# Patient Record
Sex: Male | Born: 1980
Health system: Southern US, Community
[De-identification: ages and names within clinical notes are randomized; demographics above are authoritative.]

## PROBLEM LIST (undated history)

## (undated) DIAGNOSIS — I5022 Chronic systolic (congestive) heart failure: Secondary | ICD-10-CM

## (undated) DIAGNOSIS — Z21 Asymptomatic human immunodeficiency virus [HIV] infection status: Secondary | ICD-10-CM

## (undated) DIAGNOSIS — F1910 Other psychoactive substance abuse, uncomplicated: Secondary | ICD-10-CM

## (undated) DIAGNOSIS — I509 Heart failure, unspecified: Secondary | ICD-10-CM

## (undated) DIAGNOSIS — F419 Anxiety disorder, unspecified: Secondary | ICD-10-CM

## (undated) DIAGNOSIS — I10 Essential (primary) hypertension: Secondary | ICD-10-CM

## (undated) DIAGNOSIS — I429 Cardiomyopathy, unspecified: Secondary | ICD-10-CM

## (undated) DIAGNOSIS — B20 Human immunodeficiency virus [HIV] disease: Secondary | ICD-10-CM

## (undated) DIAGNOSIS — Z9114 Patient's other noncompliance with medication regimen: Secondary | ICD-10-CM

## (undated) DIAGNOSIS — Z91148 Patient's other noncompliance with medication regimen for other reason: Secondary | ICD-10-CM

## (undated) DIAGNOSIS — N182 Chronic kidney disease, stage 2 (mild): Secondary | ICD-10-CM

## (undated) DIAGNOSIS — I517 Cardiomegaly: Secondary | ICD-10-CM

## (undated) HISTORY — PX: NO PAST SURGERIES: SHX2092

## (undated) HISTORY — DX: Other psychoactive substance abuse, uncomplicated: F19.10

## (undated) HISTORY — DX: Asymptomatic human immunodeficiency virus (hiv) infection status: Z21

## (undated) HISTORY — DX: Anxiety disorder, unspecified: F41.9

## (undated) HISTORY — DX: Essential (primary) hypertension: I10

## (undated) HISTORY — DX: Cardiomyopathy, unspecified: I42.9

## (undated) HISTORY — DX: Chronic kidney disease, stage 2 (mild): N18.2

## (undated) HISTORY — DX: Chronic systolic (congestive) heart failure: I50.22

## (undated) HISTORY — DX: Human immunodeficiency virus (HIV) disease: B20

---

## 2018-04-12 ENCOUNTER — Encounter (HOSPITAL_COMMUNITY): Payer: Self-pay | Admitting: Emergency Medicine

## 2018-04-12 ENCOUNTER — Other Ambulatory Visit: Payer: Self-pay

## 2018-04-12 ENCOUNTER — Emergency Department (HOSPITAL_COMMUNITY): Payer: Self-pay

## 2018-04-12 ENCOUNTER — Emergency Department (HOSPITAL_COMMUNITY)
Admission: EM | Admit: 2018-04-12 | Discharge: 2018-04-12 | Disposition: A | Discharge status: Home / Self Care | Payer: Self-pay | Attending: Emergency Medicine | Admitting: Emergency Medicine

## 2018-04-12 DIAGNOSIS — N50819 Testicular pain, unspecified: Secondary | ICD-10-CM

## 2018-04-12 DIAGNOSIS — F17210 Nicotine dependence, cigarettes, uncomplicated: Secondary | ICD-10-CM | POA: Insufficient documentation

## 2018-04-12 DIAGNOSIS — N50812 Left testicular pain: Secondary | ICD-10-CM | POA: Insufficient documentation

## 2018-04-12 DIAGNOSIS — I10 Essential (primary) hypertension: Secondary | ICD-10-CM | POA: Insufficient documentation

## 2018-04-12 HISTORY — DX: Essential (primary) hypertension: I10

## 2018-04-12 LAB — URINALYSIS, ROUTINE W REFLEX MICROSCOPIC
BACTERIA UA: NONE SEEN
Bilirubin Urine: NEGATIVE
Glucose, UA: NEGATIVE mg/dL
KETONES UR: NEGATIVE mg/dL
Leukocytes, UA: NEGATIVE
Nitrite: NEGATIVE
Protein, ur: 100 mg/dL — AB
SPECIFIC GRAVITY, URINE: 1.027 (ref 1.005–1.030)
pH: 5 (ref 5.0–8.0)

## 2018-04-12 MED ORDER — IBUPROFEN 800 MG PO TABS: 800.0000 mg | ORAL_TABLET | Freq: Three times a day (TID) | ORAL | 0 refills | Status: DC | PRN

## 2018-04-12 NOTE — ED Provider Notes (Signed)
[facility] EMERGENCY DEPARTMENT Provider Note   CSN: [id number] Arrival date & time: 04/12/18  1731     History   Chief Complaint Chief Complaint  Patient presents with  . Groin Pain    HPI Richard Cox is a 37 y.o. male.  He is presented in the emergency department with some left testicular discomfort since around 5 AM this morning.  He states it woke him up in its but is been generally sore.  He has felt the sensation in the past where the testicle has twisted and it is usually untwisted on its own with time.  He is never needed to present to the emergency department but it has not resolved and so he wanted to get checked out.  He is had no difficulty passing any urine denies any STD exposures no discharge,  no sores.  no fever or abdominal pain.  There is no trauma.  The history is provided by the patient.  Groin Pain  This is a new problem. The current episode started 6 to 12 hours ago. The problem occurs constantly. The problem has not changed since onset.Pertinent negatives include no chest pain, no abdominal pain, no headaches and no shortness of breath. The symptoms are aggravated by walking. The symptoms are relieved by rest. He has tried rest for the symptoms. The treatment provided mild relief.    Past Medical History:  Diagnosis Date  . Hypertension     There are no active problems to display for this patient.   History reviewed. No pertinent surgical history.      Home Medications    Prior to Admission medications   Medication Sig Start Date End Date Taking? Authorizing Provider  acetaminophen (TYLENOL) 500 MG tablet Take 500-1,500 mg by mouth daily as needed for mild pain or moderate pain.   Yes [provider]  ibuprofen (ADVIL,MOTRIN) 800 MG tablet Take 1 tablet (800 mg total) by mouth every 8 (eight) hours as needed. 04/12/18   Hayden Rasmussen, MD    Family History History reviewed. No pertinent family history.  Social History Social History    Tobacco Use  . Smoking status: Current Every Day Smoker    Packs/day: 1.00    Types: Cigarettes  . Smokeless tobacco: Never Used  Substance Use Topics  . Alcohol use: Yes    Comment: occ  . Drug use: Not on file     Allergies   Patient has no known allergies.   Review of Systems Review of Systems  Constitutional: Negative for fever.  HENT: Negative for sore throat.   Eyes: Negative for visual disturbance.  Respiratory: Negative for shortness of breath.   Cardiovascular: Negative for chest pain.  Gastrointestinal: Negative for abdominal pain.  Genitourinary: Positive for testicular pain. Negative for dysuria and frequency.  Musculoskeletal: Negative for back pain and neck pain.  Skin: Negative for rash.  Neurological: Negative for headaches.     Physical Exam Updated Vital Signs BP (!) 152/102   Pulse 79   Temp 98.5 F (36.9 C) (Oral)   Resp 18   Ht 6\' 1"  (1.854 m)   Wt 77.3 kg (170 lb 6 oz)   SpO2 99%   BMI 22.48 kg/m   Physical Exam  Constitutional: He appears well-developed and well-nourished.  HENT:  Head: Normocephalic and atraumatic.  Eyes: Conjunctivae are normal.  Neck: Neck supple.  Cardiovascular: Normal rate and regular rhythm.  No murmur heard. Pulmonary/Chest: Effort normal and breath sounds normal. No respiratory distress.  Abdominal: Soft. There is no tenderness. Hernia confirmed negative in the right inguinal area and confirmed negative in the left inguinal area.  Genitourinary: Penis normal. Right testis shows no mass, no swelling and no tenderness. Left testis shows tenderness. Left testis shows no mass and no swelling. Circumcised. No penile tenderness.  Musculoskeletal: He exhibits no edema.  Lymphadenopathy: No inguinal adenopathy noted on the right or left side.  Neurological: He is alert.  Skin: Skin is warm and dry.  Psychiatric: He has a normal mood and affect.  Nursing note and vitals reviewed.    ED Treatments / Results   Labs (all labs ordered are listed, but only abnormal results are displayed) Labs Reviewed  URINALYSIS, ROUTINE W REFLEX MICROSCOPIC - Abnormal; Notable for the following components:      Result Value   Hgb urine dipstick MODERATE (*)    Protein, ur 100 (*)    All other components within normal limits  GC/CHLAMYDIA PROBE AMP ([place]) NOT AT [facility]    EKG None  Radiology US Scrotum W/doppler  Result Date: 04/12/2018 CLINICAL DATA:  Left scrotal pain. EXAM: SCROTAL ULTRASOUND DOPPLER ULTRASOUND OF THE TESTICLES TECHNIQUE: Complete ultrasound examination of the testicles, epididymis, and other scrotal structures was performed. Color and spectral Doppler ultrasound were also utilized to evaluate blood flow to the testicles. COMPARISON:  None. FINDINGS: Right testicle Measurements: 4.7 x 2.3 x 3.5 cm. Homogeneous echogenicity with normal blood flow. No mass or microlithiasis visualized. Left testicle Measurements: 5.4 x 2.1 x 3.7 cm. Homogeneous echogenicity with normal blood flow. No mass or microlithiasis visualized. Right epididymis:  Normal in size and appearance. Left epididymis:  Normal in size and appearance.  No hyperemia. Hydrocele:  Small bilateral. Varicocele:  None visualized. Pulsed Doppler interrogation of both testes demonstrates normal low resistance arterial and venous waveforms bilaterally. IMPRESSION: 1. Small bilateral hydroceles. 2. Otherwise normal scrotal ultrasound with Doppler. No testicular torsion. Electronically Signed   By: Jeb Levering M.D.   On: 04/12/2018 19:32    Procedures Procedures (including critical care time)  Medications Ordered in ED Medications - No data to display   Initial Impression / Assessment and Plan / ED Course  I have reviewed the triage vital signs and the nursing notes.  Pertinent labs & imaging results that were available during my care of the patient were reviewed by me and considered in my medical decision making (see chart for  details).  Clinical Course as of Apr 15 1139  Tue Apr 12, 2018  1942 I reviewed the report of the patient's ultrasound with him.  He is comfortable with the plan of ibuprofen and scrotal elevation and following up outpatient with a urologist.  Just waiting on the results of his urinalysis to make sure there is no infectious reasons.   [MB]    Clinical Course User Index [MB] Hayden Rasmussen, MD    Final Clinical Impressions(s) / ED Diagnoses   Final diagnoses:  Testicle pain    ED Discharge Orders        Ordered    ibuprofen (ADVIL,MOTRIN) 800 MG tablet  Every 8 hours PRN     04/12/18 2004       Hayden Rasmussen, MD 04/14/18 1141

## 2018-04-12 NOTE — ED Triage Notes (Signed)
Pt states started having left groin testicular pain last night. Pt states sizing is different.

## 2018-04-12 NOTE — Discharge Instructions (Addendum)
Your evaluated in the emergency department for acute left-sided testicular pain.  You had an ultrasound that did not show any signs of torsion.  You should use ibuprofen and scrotal elevation and ice to the affected area.  We are providing you with the name and number of urologist to follow-up with for further evaluation of the symptoms.  Please return if any worsening symptoms.

## 2018-04-12 NOTE — ED Notes (Signed)
Patient informed of need for urine sample-  Patient states he cannot provide one at this time but was given a urinal and will inform nursing staff when able to do so.

## 2018-04-12 NOTE — ED Notes (Signed)
[place] tech called back in

## 2018-04-14 LAB — GC/CHLAMYDIA PROBE AMP (~~LOC~~) NOT AT ARMC
CHLAMYDIA, DNA PROBE: NEGATIVE
NEISSERIA GONORRHEA: NEGATIVE

## 2019-03-09 ENCOUNTER — Inpatient Hospital Stay (HOSPITAL_COMMUNITY)
Admission: EM | Admit: 2019-03-09 | Discharge: 2019-03-13 | DRG: 291 | Disposition: A | Discharge status: Home / Self Care | Payer: Self-pay | Attending: Family Medicine | Admitting: Family Medicine

## 2019-03-09 ENCOUNTER — Other Ambulatory Visit: Payer: Self-pay

## 2019-03-09 ENCOUNTER — Encounter (HOSPITAL_COMMUNITY): Payer: Self-pay | Admitting: Emergency Medicine

## 2019-03-09 ENCOUNTER — Emergency Department (HOSPITAL_COMMUNITY): Payer: Self-pay

## 2019-03-09 DIAGNOSIS — Z791 Long term (current) use of non-steroidal anti-inflammatories (NSAID): Secondary | ICD-10-CM

## 2019-03-09 DIAGNOSIS — Z20828 Contact with and (suspected) exposure to other viral communicable diseases: Secondary | ICD-10-CM | POA: Diagnosis present

## 2019-03-09 DIAGNOSIS — F1010 Alcohol abuse, uncomplicated: Secondary | ICD-10-CM | POA: Diagnosis present

## 2019-03-09 DIAGNOSIS — N189 Chronic kidney disease, unspecified: Secondary | ICD-10-CM | POA: Diagnosis present

## 2019-03-09 DIAGNOSIS — R509 Fever, unspecified: Secondary | ICD-10-CM

## 2019-03-09 DIAGNOSIS — I248 Other forms of acute ischemic heart disease: Secondary | ICD-10-CM | POA: Diagnosis present

## 2019-03-09 DIAGNOSIS — Z79899 Other long term (current) drug therapy: Secondary | ICD-10-CM

## 2019-03-09 DIAGNOSIS — I5023 Acute on chronic systolic (congestive) heart failure: Secondary | ICD-10-CM | POA: Diagnosis present

## 2019-03-09 DIAGNOSIS — I160 Hypertensive urgency: Secondary | ICD-10-CM | POA: Diagnosis present

## 2019-03-09 DIAGNOSIS — I130 Hypertensive heart and chronic kidney disease with heart failure and stage 1 through stage 4 chronic kidney disease, or unspecified chronic kidney disease: Principal | ICD-10-CM | POA: Diagnosis present

## 2019-03-09 DIAGNOSIS — R0602 Shortness of breath: Secondary | ICD-10-CM

## 2019-03-09 DIAGNOSIS — J189 Pneumonia, unspecified organism: Secondary | ICD-10-CM | POA: Diagnosis present

## 2019-03-09 DIAGNOSIS — E559 Vitamin D deficiency, unspecified: Secondary | ICD-10-CM | POA: Diagnosis present

## 2019-03-09 DIAGNOSIS — I509 Heart failure, unspecified: Secondary | ICD-10-CM

## 2019-03-09 DIAGNOSIS — N182 Chronic kidney disease, stage 2 (mild): Secondary | ICD-10-CM | POA: Diagnosis present

## 2019-03-09 DIAGNOSIS — F17200 Nicotine dependence, unspecified, uncomplicated: Secondary | ICD-10-CM | POA: Diagnosis present

## 2019-03-09 DIAGNOSIS — I429 Cardiomyopathy, unspecified: Secondary | ICD-10-CM | POA: Diagnosis present

## 2019-03-09 DIAGNOSIS — R05 Cough: Secondary | ICD-10-CM

## 2019-03-09 DIAGNOSIS — Z9114 Patient's other noncompliance with medication regimen: Secondary | ICD-10-CM

## 2019-03-09 DIAGNOSIS — R75 Inconclusive laboratory evidence of human immunodeficiency virus [HIV]: Secondary | ICD-10-CM | POA: Diagnosis present

## 2019-03-09 DIAGNOSIS — Z833 Family history of diabetes mellitus: Secondary | ICD-10-CM

## 2019-03-09 DIAGNOSIS — R059 Cough, unspecified: Secondary | ICD-10-CM

## 2019-03-09 DIAGNOSIS — I10 Essential (primary) hypertension: Secondary | ICD-10-CM | POA: Diagnosis present

## 2019-03-09 DIAGNOSIS — D509 Iron deficiency anemia, unspecified: Secondary | ICD-10-CM | POA: Diagnosis present

## 2019-03-09 HISTORY — DX: Patient's other noncompliance with medication regimen for other reason: Z91.148

## 2019-03-09 HISTORY — DX: Patient's other noncompliance with medication regimen: Z91.14

## 2019-03-09 LAB — CBC WITH DIFFERENTIAL/PLATELET
Abs Immature Granulocytes: 0.01 10*3/uL (ref 0.00–0.07)
Basophils Absolute: 0 10*3/uL (ref 0.0–0.1)
Basophils Relative: 0 %
Eosinophils Absolute: 0 10*3/uL (ref 0.0–0.5)
Eosinophils Relative: 1 %
HCT: 35 % — ABNORMAL LOW (ref 39.0–52.0)
Hemoglobin: 11 g/dL — ABNORMAL LOW (ref 13.0–17.0)
Immature Granulocytes: 0 %
Lymphocytes Relative: 35 %
Lymphs Abs: 1.5 10*3/uL (ref 0.7–4.0)
MCH: 28.1 pg (ref 26.0–34.0)
MCHC: 31.4 g/dL (ref 30.0–36.0)
MCV: 89.3 fL (ref 80.0–100.0)
Monocytes Absolute: 0.3 10*3/uL (ref 0.1–1.0)
Monocytes Relative: 7 %
Neutro Abs: 2.5 10*3/uL (ref 1.7–7.7)
Neutrophils Relative %: 57 %
Platelets: 155 10*3/uL (ref 150–400)
RBC: 3.92 MIL/uL — ABNORMAL LOW (ref 4.22–5.81)
RDW: 16.9 % — ABNORMAL HIGH (ref 11.5–15.5)
WBC: 4.3 10*3/uL (ref 4.0–10.5)
nRBC: 0 % (ref 0.0–0.2)

## 2019-03-09 LAB — BASIC METABOLIC PANEL
Anion gap: 8 (ref 5–15)
BUN: 26 mg/dL — ABNORMAL HIGH (ref 6–20)
CO2: 23 mmol/L (ref 22–32)
Calcium: 8 mg/dL — ABNORMAL LOW (ref 8.9–10.3)
Chloride: 106 mmol/L (ref 98–111)
Creatinine, Ser: 1.62 mg/dL — ABNORMAL HIGH (ref 0.61–1.24)
GFR calc Af Amer: >60 mL/min (ref >60)
GFR calc non Af Amer: 54 mL/min — ABNORMAL LOW (ref >60)
Glucose, Bld: 87 mg/dL (ref 70–99)
Potassium: 4 mmol/L (ref 3.5–5.1)
Sodium: 137 mmol/L (ref 135–145)

## 2019-03-09 LAB — ABO/RH: ABO/RH(D): A POS

## 2019-03-09 LAB — CBG MONITORING, ED: Glucose-Capillary: 91 mg/dL (ref 70–99)

## 2019-03-09 LAB — BRAIN NATRIURETIC PEPTIDE: B Natriuretic Peptide: 2445 pg/mL — ABNORMAL HIGH (ref 0.0–100.0)

## 2019-03-09 LAB — TROPONIN I: Troponin I: 0.13 ng/mL (ref <0.03)

## 2019-03-09 LAB — SARS CORONAVIRUS 2 BY RT PCR (HOSPITAL ORDER, PERFORMED IN ~~LOC~~ HOSPITAL LAB): SARS Coronavirus 2: NEGATIVE

## 2019-03-09 MED ORDER — NITROGLYCERIN IN D5W 200-5 MCG/ML-% IV SOLN
5.0000 ug/min | INTRAVENOUS | Status: DC
  Administered 2019-03-09: 23:00:00 10 ug/min via INTRAVENOUS
  Administered 2019-03-10: 160 ug/min via INTRAVENOUS
  Filled 2019-03-09 (×2): qty 250

## 2019-03-09 MED ORDER — FUROSEMIDE 10 MG/ML IJ SOLN
40.0000 mg | Freq: Once | INTRAMUSCULAR | Status: AC
  Administered 2019-03-09: 40 mg via INTRAVENOUS
  Filled 2019-03-09: qty 4

## 2019-03-09 NOTE — ED Notes (Signed)
Pt reports having a frequent cough and has not seen PCP in apprx. 3 years. Pt reports taking lots of Sudafed for OTC treatments and states Mucinex and other medications do not help.

## 2019-03-09 NOTE — ED Notes (Signed)
Date and time results received: 03/09/19 2130   Test: Troponin Critical Value: 0.13  Name of Provider Notified: Eulis Foster, MD

## 2019-03-09 NOTE — ED Provider Notes (Addendum)
[facility] EMERGENCY DEPARTMENT Provider Note   CSN: [id number] Arrival date & time: 03/09/19  1905    History   Chief Complaint Chief Complaint  Patient presents with   Shortness of Breath    HPI Richard Cox is a 38 y.o. male.     HPI   He presents for evaluation of shortness of breath and leg swelling.  Onset of symptoms gradually over the last couple weeks.  He denies chest pain.  He has known hypertension but stopped taking medications for it several years ago.  He denies fever, chills, productive cough, weakness or dizziness.  There is been no headache, blurred vision, change in his ability to walk, urinate or have bowel movements.  There are no other known modifying factors.  Past Medical History:  Diagnosis Date   Hypertension     There are no active problems to display for this patient.   History reviewed. No pertinent surgical history.      Home Medications    Prior to Admission medications   Medication Sig Start Date End Date Taking? Authorizing Provider  acetaminophen (TYLENOL) 500 MG tablet Take 500-1,500 mg by mouth daily as needed for mild pain or moderate pain.    [provider]  ibuprofen (ADVIL,MOTRIN) 800 MG tablet Take 1 tablet (800 mg total) by mouth every 8 (eight) hours as needed. 04/12/18   Hayden Rasmussen, MD    Family History History reviewed. No pertinent family history.  Social History Social History   Tobacco Use   Smoking status: Current Every Day Smoker    Packs/day: 1.00    Types: Cigarettes   Smokeless tobacco: Never Used  Substance Use Topics   Alcohol use: Yes    Comment: occ   Drug use: Not on file     Allergies   Neosporin [neomycin-bacitracin zn-polymyx]   Review of Systems Review of Systems  All other systems reviewed and are negative.    Physical Exam Updated Vital Signs BP (!) 189/138    Pulse (!) 108    Temp 98.3 F (36.8 C) (Oral)    Resp (!) 24    Ht 6\' 1"  (1.854 m)    Wt 79.4 kg     SpO2 100%    BMI 23.09 kg/m   Physical Exam Vitals signs and nursing note reviewed.  Constitutional:      General: He is not in acute distress.    Appearance: He is well-developed and normal weight. He is not ill-appearing, toxic-appearing or diaphoretic.  HENT:     Head: Normocephalic and atraumatic.     Right Ear: External ear normal.     Left Ear: External ear normal.  Eyes:     Conjunctiva/sclera: Conjunctivae normal.     Pupils: Pupils are equal, round, and reactive to light.  Neck:     Musculoskeletal: Normal range of motion and neck supple.     Trachea: Phonation normal.  Cardiovascular:     Rate and Rhythm: Regular rhythm. Tachycardia present.     Pulses: Normal pulses.     Heart sounds: Normal heart sounds.  Pulmonary:     Effort: Pulmonary effort is normal. No respiratory distress.     Breath sounds: Normal breath sounds. No stridor. No rhonchi.  Abdominal:     General: There is no distension.     Palpations: Abdomen is soft.     Tenderness: There is abdominal tenderness (Right upper quadrant, mild).  Musculoskeletal: Normal range of motion.  Right lower leg: Edema present.     Left lower leg: Edema present.     Comments: 3+ bilateral lower leg edema.  Skin:    General: Skin is warm and dry.  Neurological:     Mental Status: He is alert and oriented to person, place, and time.     Cranial Nerves: No cranial nerve deficit.     Sensory: No sensory deficit.     Motor: No abnormal muscle tone.     Coordination: Coordination normal.  Psychiatric:        Behavior: Behavior normal.        Thought Content: Thought content normal.        Judgment: Judgment normal.      ED Treatments / Results  Labs (all labs ordered are listed, but only abnormal results are displayed) Labs Reviewed  BRAIN NATRIURETIC PEPTIDE - Abnormal; Notable for the following components:      Result Value   B Natriuretic Peptide 2,445.0 (*)    All other components within normal limits   TROPONIN I - Abnormal; Notable for the following components:   Troponin I 0.13 (*)    All other components within normal limits  BASIC METABOLIC PANEL - Abnormal; Notable for the following components:   BUN 26 (*)    Creatinine, Ser 1.62 (*)    Calcium 8.0 (*)    GFR calc non Af Amer 54 (*)    All other components within normal limits  CBC WITH DIFFERENTIAL/PLATELET - Abnormal; Notable for the following components:   RBC 3.92 (*)    Hemoglobin 11.0 (*)    HCT 35.0 (*)    RDW 16.9 (*)    All other components within normal limits  SARS CORONAVIRUS 2 (HOSPITAL ORDER, [facility])  HIV ANTIBODY (ROUTINE TESTING W REFLEX)  CBG MONITORING, ED  ABO/RH    EKG EKG Interpretation  Date/Time:  Thursday March 09 2019 19:48:48 EDT Ventricular Rate:  106 PR Interval:    QRS Duration: 86 QT Interval:  349 QTC Calculation: 464 R Axis:   34 Text Interpretation:  Sinus tachycardia LAE, consider biatrial enlargement Abnormal R-wave progression, early transition Probable left ventricular hypertrophy Nonspecific T abnormalities, inferior leads Baseline wander in lead(s) V6 No old tracing to compare Confirmed by Daleen Bo [phone]) on 03/09/2019 11:16:54 PM   Radiology Dg Chest Port 1 View  Result Date: 03/09/2019 CLINICAL DATA:  Initial evaluation for acute shortness of breath, hypertension, lower extremity swelling. EXAM: PORTABLE CHEST 1 VIEW COMPARISON:  None available. FINDINGS: Cardiomegaly. Mediastinal silhouette within normal limits. Lungs normally inflated. Diffuse pulmonary vascular congestion with interstitial prominence and scattered Kerley B-lines, compatible with pulmonary interstitial edema. Asymmetric hazy opacity overlying the left lung favored to be secondary to patient positioning as the patient is slightly rotated to the left, although superimposed infiltrate could be considered in the correct clinical setting. No appreciable pleural effusion. No  pneumothorax. No acute osseous finding. IMPRESSION: 1. Cardiomegaly with mild diffuse pulmonary interstitial edema. 2. Asymmetric hazy opacity within the mid left lung, favored to be related to patient positioning and/or asymmetric edema. Superimposed infection would be difficult to exclude, and could be considered in the correct clinical setting. Electronically Signed   By: Jeannine Boga M.D.   On: 03/09/2019 20:00    Procedures .Critical Care Performed by: Daleen Bo, MD Authorized by: Daleen Bo, MD   Critical care provider statement:    Critical care time (minutes):  40   Critical  care start time:  03/09/2019 8:15 PM   Critical care end time:  03/09/2019 11:26 PM   Critical care time was exclusive of:  Separately billable procedures and treating other patients   Critical care was necessary to treat or prevent imminent or life-threatening deterioration of the following conditions:  Circulatory failure   Critical care was time spent personally by me on the following activities:  Blood draw for specimens, development of treatment plan with patient or surrogate, discussions with consultants, evaluation of patient's response to treatment, examination of patient, obtaining history from patient or surrogate, ordering and performing treatments and interventions, ordering and review of laboratory studies, pulse oximetry, re-evaluation of patient's condition, review of old charts and ordering and review of radiographic studies   (including critical care time)  Medications Ordered in ED Medications  nitroGLYCERIN 50 mg in dextrose 5 % 250 mL (0.2 mg/mL) infusion (10 mcg/min Intravenous New Bag/Given 03/09/19 2258)  furosemide (LASIX) injection 40 mg (40 mg Intravenous Given 03/09/19 2251)     Initial Impression / Assessment and Plan / ED Course  I have reviewed the triage vital signs and the nursing notes.  Pertinent labs & imaging results that were available during my care of the  patient were reviewed by me and considered in my medical decision making (see chart for details).  Clinical Course as of Mar 08 2324  Thu Mar 09, 2019  2235 Normal except BUN high, creatinine high, calcium low, GFR low  Basic metabolic panel(!) [EW]  1324 Negative  SARS Coronavirus 2 [facility] order, Performed in [facility] hospital lab) [EW]  2236 Normal except hemoglobin low  CBC with Differential(!) [EW]  2236 Elevated  Brain natriuretic peptide(!) [EW]  2236 Consistent with pulmonary edema, image is reviewed by me  DG Chest Port 1 View [EW]  2324 MAP down about 10 % in 15 min on NTG drip.    [EW]    Clinical Course User Index [EW] Daleen Bo, MD        Patient Vitals for the past 24 hrs:  BP Temp Temp src Pulse Resp SpO2 Height Weight  03/09/19 2230 -- -- -- (!) 108 (!) 24 100 % -- --  03/09/19 2220 (!) 189/138 -- -- (!) 107 (!) 24 97 % -- --  03/09/19 2200 (!) 188/143 -- -- (!) 110 (!) 21 98 % -- --  03/09/19 2140 (!) 189/141 -- -- (!) 107 15 98 % -- --  03/09/19 2100 (!) 190/145 -- -- (!) 104 (!) 24 98 % -- --  03/09/19 2040 (!) 185/141 -- -- (!) 105 (!) 22 97 % -- --  03/09/19 2020 (!) 182/143 -- -- (!) 108 (!) 25 98 % -- --  03/09/19 2000 (!) 183/136 -- -- (!) 105 (!) 24 98 % -- --  03/09/19 1924 -- -- -- -- -- -- 6\' 1"  (1.854 m) 79.4 kg  03/09/19 1922 (!) 197/138 98.3 F (36.8 C) Oral (!) 113 (!) 26 100 % -- --    11:21 PM Reevaluation with update and discussion. After initial assessment and treatment, an updated evaluation reveals he is comfortable now denies chest pain or shortness of breath.  He is sitting up.  Findings discussed and questions answered. Daleen Bo   Medical Decision Making: Shortness of breath with leg swelling, and pulmonary edema.  Patient with moderately high blood pressure, clinical scenario consistent with hypertensive urgency.  Mild bump of troponin, likely related to hypertensive urgency.  Patient has been noncompliant with  blood  pressure medication, and has not followed up for of it in several years.  Doubt ACS, COVID 19 infection, bacterial infection or metabolic instability.   Jeromie Gainor was evaluated in Emergency Department on 03/10/2019 for the symptoms described in the history of present illness. He was evaluated in the context of the global COVID-19 pandemic, which necessitated consideration that the patient might be at risk for infection with the SARS-CoV-2 virus that causes COVID-19. Institutional protocols and algorithms that pertain to the evaluation of patients at risk for COVID-19 are in a state of rapid change based on information released by regulatory bodies including the CDC and federal and state organizations. These policies and algorithms were followed during the patient's care in the ED.  CRITICAL CARE-yes  Performed by: Daleen Bo  Nursing Notes Reviewed/ Care Coordinated Applicable Imaging Reviewed Interpretation of Laboratory Data incorporated into ED treatment  11:21 PM-Consult complete with hospitalist. Patient case explained and discussed.  He agrees to admit patient for further evaluation and treatment. Call ended at 11:35 PM    Plan: Admit  Final Clinical Impressions(s) / ED Diagnoses   Final diagnoses:  Hypertensive urgency  Congestive heart failure, unspecified HF chronicity, unspecified heart failure type [facility])    ED Discharge Orders    None       Daleen Bo, MD 03/10/19 [place]    Daleen Bo, MD 03/10/19 1059

## 2019-03-09 NOTE — ED Triage Notes (Signed)
Patient states shortness of breath with chest pain and lower extremity swelling. Patient states this started x 3 days ago. Patient hypertensive.

## 2019-03-09 NOTE — ED Notes (Signed)
ED Provider at bedside. 

## 2019-03-10 ENCOUNTER — Encounter (HOSPITAL_COMMUNITY): Payer: Self-pay | Admitting: *Deleted

## 2019-03-10 ENCOUNTER — Inpatient Hospital Stay (HOSPITAL_COMMUNITY): Payer: Self-pay

## 2019-03-10 DIAGNOSIS — N289 Disorder of kidney and ureter, unspecified: Secondary | ICD-10-CM

## 2019-03-10 DIAGNOSIS — I160 Hypertensive urgency: Secondary | ICD-10-CM

## 2019-03-10 DIAGNOSIS — N189 Chronic kidney disease, unspecified: Secondary | ICD-10-CM | POA: Diagnosis present

## 2019-03-10 DIAGNOSIS — I5041 Acute combined systolic (congestive) and diastolic (congestive) heart failure: Secondary | ICD-10-CM

## 2019-03-10 DIAGNOSIS — I10 Essential (primary) hypertension: Secondary | ICD-10-CM | POA: Diagnosis present

## 2019-03-10 DIAGNOSIS — I361 Nonrheumatic tricuspid (valve) insufficiency: Secondary | ICD-10-CM

## 2019-03-10 DIAGNOSIS — I371 Nonrheumatic pulmonary valve insufficiency: Secondary | ICD-10-CM

## 2019-03-10 DIAGNOSIS — R0602 Shortness of breath: Secondary | ICD-10-CM

## 2019-03-10 DIAGNOSIS — I509 Heart failure, unspecified: Secondary | ICD-10-CM

## 2019-03-10 DIAGNOSIS — Z9114 Patient's other noncompliance with medication regimen: Secondary | ICD-10-CM

## 2019-03-10 LAB — CBC WITH DIFFERENTIAL/PLATELET
Abs Immature Granulocytes: 0.02 10*3/uL (ref 0.00–0.07)
Basophils Absolute: 0 10*3/uL (ref 0.0–0.1)
Basophils Relative: 0 %
Eosinophils Absolute: 0 10*3/uL (ref 0.0–0.5)
Eosinophils Relative: 0 %
HCT: 30.5 % — ABNORMAL LOW (ref 39.0–52.0)
Hemoglobin: 9.6 g/dL — ABNORMAL LOW (ref 13.0–17.0)
Immature Granulocytes: 0 %
Lymphocytes Relative: 32 %
Lymphs Abs: 1.7 10*3/uL (ref 0.7–4.0)
MCH: 27.7 pg (ref 26.0–34.0)
MCHC: 31.5 g/dL (ref 30.0–36.0)
MCV: 88.2 fL (ref 80.0–100.0)
Monocytes Absolute: 0.4 10*3/uL (ref 0.1–1.0)
Monocytes Relative: 7 %
Neutro Abs: 3.3 10*3/uL (ref 1.7–7.7)
Neutrophils Relative %: 61 %
Platelets: 172 10*3/uL (ref 150–400)
RBC: 3.46 MIL/uL — ABNORMAL LOW (ref 4.22–5.81)
RDW: 16.6 % — ABNORMAL HIGH (ref 11.5–15.5)
WBC: 5.5 10*3/uL (ref 4.0–10.5)
nRBC: 0 % (ref 0.0–0.2)

## 2019-03-10 LAB — URINALYSIS, ROUTINE W REFLEX MICROSCOPIC
Bacteria, UA: NONE SEEN
Bilirubin Urine: NEGATIVE
Glucose, UA: NEGATIVE mg/dL
Ketones, ur: NEGATIVE mg/dL
Leukocytes,Ua: NEGATIVE
Nitrite: NEGATIVE
Protein, ur: NEGATIVE mg/dL
Specific Gravity, Urine: 1.011 (ref 1.005–1.030)
pH: 7 (ref 5.0–8.0)

## 2019-03-10 LAB — ECHOCARDIOGRAM COMPLETE
Height: 73 in
Weight: 2567.92 oz

## 2019-03-10 LAB — HEPATIC FUNCTION PANEL
ALT: 28 U/L (ref 0–44)
AST: 30 U/L (ref 15–41)
Albumin: 2.4 g/dL — ABNORMAL LOW (ref 3.5–5.0)
Alkaline Phosphatase: 93 U/L (ref 38–126)
Bilirubin, Direct: 0.1 mg/dL (ref 0.0–0.2)
Indirect Bilirubin: 0.5 mg/dL (ref 0.3–0.9)
Total Bilirubin: 0.6 mg/dL (ref 0.3–1.2)
Total Protein: 7.5 g/dL (ref 6.5–8.1)

## 2019-03-10 LAB — TROPONIN I
Troponin I: 0.14 ng/mL (ref <0.03)
Troponin I: 0.17 ng/mL (ref <0.03)
Troponin I: 0.18 ng/mL (ref <0.03)

## 2019-03-10 LAB — TSH: TSH: 1.433 u[IU]/mL (ref 0.350–4.500)

## 2019-03-10 LAB — MRSA PCR SCREENING: MRSA by PCR: NEGATIVE

## 2019-03-10 MED ORDER — ONDANSETRON HCL 4 MG/2ML IJ SOLN: 4.0000 mg | Freq: Four times a day (QID) | INTRAMUSCULAR | Status: DC | PRN

## 2019-03-10 MED ORDER — HYDRALAZINE HCL 25 MG PO TABS
25.0000 mg | ORAL_TABLET | Freq: Four times a day (QID) | ORAL | Status: DC | PRN
  Administered 2019-03-10 (×2): 25 mg via ORAL
  Filled 2019-03-10 (×2): qty 1

## 2019-03-10 MED ORDER — ENOXAPARIN SODIUM 40 MG/0.4ML ~~LOC~~ SOLN
40.0000 mg | SUBCUTANEOUS | Status: DC
  Administered 2019-03-10 – 2019-03-13 (×4): 40 mg via SUBCUTANEOUS
  Filled 2019-03-10 (×4): qty 0.4

## 2019-03-10 MED ORDER — CARVEDILOL 3.125 MG PO TABS: 3.1250 mg | ORAL_TABLET | Freq: Two times a day (BID) | ORAL | Status: DC

## 2019-03-10 MED ORDER — ACETAMINOPHEN 325 MG PO TABS
650.0000 mg | ORAL_TABLET | ORAL | Status: DC | PRN
  Administered 2019-03-10 – 2019-03-11 (×6): 650 mg via ORAL
  Filled 2019-03-10 (×6): qty 2

## 2019-03-10 MED ORDER — HYDRALAZINE HCL 10 MG PO TABS
10.0000 mg | ORAL_TABLET | Freq: Three times a day (TID) | ORAL | Status: DC
  Administered 2019-03-10 – 2019-03-11 (×3): 10 mg via ORAL
  Filled 2019-03-10 (×3): qty 1

## 2019-03-10 MED ORDER — SODIUM CHLORIDE 0.9 % IV SOLN: 250.0000 mL | INTRAVENOUS | Status: DC | PRN

## 2019-03-10 MED ORDER — DOXYCYCLINE HYCLATE 100 MG PO TABS
100.0000 mg | ORAL_TABLET | Freq: Two times a day (BID) | ORAL | Status: DC
  Administered 2019-03-10 (×2): 100 mg via ORAL
  Filled 2019-03-10 (×2): qty 1

## 2019-03-10 MED ORDER — FUROSEMIDE 10 MG/ML IJ SOLN
40.0000 mg | Freq: Two times a day (BID) | INTRAMUSCULAR | Status: DC
  Administered 2019-03-10 – 2019-03-12 (×5): 40 mg via INTRAVENOUS
  Filled 2019-03-10 (×5): qty 4

## 2019-03-10 MED ORDER — ISOSORBIDE MONONITRATE ER 60 MG PO TB24
30.0000 mg | ORAL_TABLET | Freq: Every day | ORAL | Status: DC
  Administered 2019-03-10 – 2019-03-13 (×4): 30 mg via ORAL
  Filled 2019-03-10 (×4): qty 1

## 2019-03-10 MED ORDER — SODIUM CHLORIDE 0.9% FLUSH
3.0000 mL | Freq: Two times a day (BID) | INTRAVENOUS | Status: DC
  Administered 2019-03-10 – 2019-03-13 (×8): 3 mL via INTRAVENOUS

## 2019-03-10 MED ORDER — CARVEDILOL 3.125 MG PO TABS
6.2500 mg | ORAL_TABLET | Freq: Two times a day (BID) | ORAL | Status: DC
  Administered 2019-03-10: 6.25 mg via ORAL
  Filled 2019-03-10: qty 2

## 2019-03-10 MED ORDER — SODIUM CHLORIDE 0.9% FLUSH: 3.0000 mL | INTRAVENOUS | Status: DC | PRN

## 2019-03-10 MED ORDER — AMLODIPINE BESYLATE 5 MG PO TABS
10.0000 mg | ORAL_TABLET | Freq: Every day | ORAL | Status: DC
  Administered 2019-03-10 – 2019-03-13 (×4): 10 mg via ORAL
  Filled 2019-03-10 (×5): qty 2

## 2019-03-10 NOTE — Progress Notes (Signed)
CRITICAL VALUE ALERT  Critical Value:  Troponin 0.17  Date & Time Notied:  03/10/19 @ 0945  Provider Notified: Dr. Wynetta Emery  Orders Received/Actions taken: See chart

## 2019-03-10 NOTE — Progress Notes (Signed)
Temp 101.1 oral. Gave tylenol per orders. Lung sounds diminished x all fields with productive cough tan/white thick mucus. Pt states he "feels weak". Troponin up for 0.14 to 0.17. notified Dr. Wynetta Emery gave orders to d/c nitro drip and ordered CXR. Also wrote new orders for carvedilol. Will cont to monitor.

## 2019-03-10 NOTE — Progress Notes (Signed)
COVERAGE NOTE   03/10/2019 12:04 PM  Richard Cox was seen and examined.  The H&P by the admitting provider, orders, imaging was reviewed.  Please see new orders.  Patient complaining of ongoing headache from the nitroglycerin infusion.  The patient says that overall he is feeling better and he is diuresing very well.  He says that he has a job at [employer] and he will be willing to get his prescriptions filled.  He does not have medical insurance at this time.  Vitals:   03/10/19 1100 03/10/19 1120  BP: (!) 159/98   Pulse: (!) 106   Resp: (!) 26   Temp:  (!) 101.1 F (38.4 C)  SpO2: 93%     Results for orders placed or performed during the hospital encounter of 03/09/19  SARS Coronavirus 2 [facility] order, Performed in [facility] hospital lab)  Result Value Ref Range   SARS Coronavirus 2 NEGATIVE NEGATIVE  MRSA PCR Screening  Result Value Ref Range   MRSA by PCR NEGATIVE NEGATIVE  Brain natriuretic peptide  Result Value Ref Range   B Natriuretic Peptide 2,445.0 (H) 0.0 - 100.0 pg/mL  Troponin I - Once  Result Value Ref Range   Troponin I 0.13 (HH) <0.03 ng/mL  Basic metabolic panel  Result Value Ref Range   Sodium 137 135 - 145 mmol/L   Potassium 4.0 3.5 - 5.1 mmol/L   Chloride 106 98 - 111 mmol/L   CO2 23 22 - 32 mmol/L   Glucose, Bld 87 70 - 99 mg/dL   BUN 26 (H) 6 - 20 mg/dL   Creatinine, Ser 1.62 (H) 0.61 - 1.24 mg/dL   Calcium 8.0 (L) 8.9 - 10.3 mg/dL   GFR calc non Af Amer 54 (L) >60 mL/min   GFR calc Af Amer >60 >60 mL/min   Anion gap 8 5 - 15  CBC with Differential  Result Value Ref Range   WBC 4.3 4.0 - 10.5 K/uL   RBC 3.92 (L) 4.22 - 5.81 MIL/uL   Hemoglobin 11.0 (L) 13.0 - 17.0 g/dL   HCT 35.0 (L) 39.0 - 52.0 %   MCV 89.3 80.0 - 100.0 fL   MCH 28.1 26.0 - 34.0 pg   MCHC 31.4 30.0 - 36.0 g/dL   RDW 16.9 (H) 11.5 - 15.5 %   Platelets 155 150 - 400 K/uL   nRBC 0.0 0.0 - 0.2 %   Neutrophils Relative % 57 %   Neutro Abs 2.5 1.7 - 7.7 K/uL   Lymphocytes Relative 35 %   Lymphs Abs 1.5 0.7 - 4.0 K/uL   Monocytes Relative 7 %   Monocytes Absolute 0.3 0.1 - 1.0 K/uL   Eosinophils Relative 1 %   Eosinophils Absolute 0.0 0.0 - 0.5 K/uL   Basophils Relative 0 %   Basophils Absolute 0.0 0.0 - 0.1 K/uL   Immature Granulocytes 0 %   Abs Immature Granulocytes 0.01 0.00 - 0.07 K/uL  CBC with Differential/Platelet  Result Value Ref Range   WBC 5.5 4.0 - 10.5 K/uL   RBC 3.46 (L) 4.22 - 5.81 MIL/uL   Hemoglobin 9.6 (L) 13.0 - 17.0 g/dL   HCT 30.5 (L) 39.0 - 52.0 %   MCV 88.2 80.0 - 100.0 fL   MCH 27.7 26.0 - 34.0 pg   MCHC 31.5 30.0 - 36.0 g/dL   RDW 16.6 (H) 11.5 - 15.5 %   Platelets 172 150 - 400 K/uL   nRBC 0.0 0.0 - 0.2 %  Neutrophils Relative % 61 %   Neutro Abs 3.3 1.7 - 7.7 K/uL   Lymphocytes Relative 32 %   Lymphs Abs 1.7 0.7 - 4.0 K/uL   Monocytes Relative 7 %   Monocytes Absolute 0.4 0.1 - 1.0 K/uL   Eosinophils Relative 0 %   Eosinophils Absolute 0.0 0.0 - 0.5 K/uL   Basophils Relative 0 %   Basophils Absolute 0.0 0.0 - 0.1 K/uL   Immature Granulocytes 0 %   Abs Immature Granulocytes 0.02 0.00 - 0.07 K/uL  Troponin I - Now Then Q6H  Result Value Ref Range   Troponin I 0.14 (HH) <0.03 ng/mL  Troponin I - Now Then Q6H  Result Value Ref Range   Troponin I 0.17 (HH) <0.03 ng/mL  CBG monitoring, ED  Result Value Ref Range   Glucose-Capillary 91 70 - 99 mg/dL  ECHOCARDIOGRAM COMPLETE  Result Value Ref Range   Weight 2,567.92 oz   Height 73 in   BP 160/107 mmHg  ABO/Rh  Result Value Ref Range   ABO/RH(D)      A POS Performed at [facility], [address], [place], [place] [address]    C. Wynetta Emery, MD [facility]   03/09/2019  7:27 PM How to contact the [facility] Attending or Consulting provider [place] or covering provider during after hours [place], for this patient?  1. Check the care team in [facility] and look for a) attending/consulting [facility] provider listed and b) the [facility] team listed 2. Log into  [email or web address] and use [place] universal password to access. If you do not have the password, please contact the hospital operator. 3. Locate the [facility] provider you are looking for under [facility] and page to a number that you can be directly reached. 4. If you still have difficulty reaching the provider, please page the [facility] (Director on Call) for the Hospitalists listed on amion for assistance.

## 2019-03-10 NOTE — H&P (Addendum)
TRH H&P    Patient Demographics:    Jonluke Cobbins, is a 38 y.o. male  MRN: [id number]  DOB - September 02, 1981  Admit Date - 03/09/2019  Referring MD/NP/PA: Dr. Eulis Foster  Outpatient Primary MD for the patient is Patient, No Pcp Per  Patient coming from: Home  Chief complaint-shortness of breath   HPI:    Jaymeson Mengel  is a 38 y.o. male, with history of hypertension, noncompliant with medications.  Patient says that he has not taken medication for past 3 years since he moved from [place].  Patient says over the past few weeks he has noticed worsening shortness of breath, worse on laying down flat.  Also complained of left-sided chest pain only for a brief.  Of time.  Denies fever, chills, no cough.  Denies headache, no blurred vision.  Denies dysuria.  Denies nausea vomiting or diarrhea.  In the ED, lab work revealed negative SARS coronavirus 2, BNP elevated to 2445.  Chest x-ray showed pulmonary edema, patient given 1 dose of Lasix 40 mg IV.  Also patient was hypertensive with BP 172/123.  Patient started on nitroglycerin gtt.  He denies previous history of MI. No history of stroke or seizures. Complains of orthopnea     Review of systems:    In addition to the HPI above,    All other systems reviewed and are negative.    Past History of the following :    Past Medical History:  Diagnosis Date  . Hypertension          Social History:      Social History   Tobacco Use  . Smoking status: Current Every Day Smoker    Packs/day: 1.00    Types: Cigarettes  . Smokeless tobacco: Never Used  Substance Use Topics  . Alcohol use: Yes    Comment: occ       Family History :   Patient's both mother and father have history of cancer.  Patient does not know what cancer of the head.   Home Medications:   Prior to Admission medications   Medication Sig Start Date End Date Taking? Authorizing Provider   acetaminophen (TYLENOL) 500 MG tablet Take 500-1,500 mg by mouth daily as needed for mild pain or moderate pain.    [provider]  ibuprofen (ADVIL,MOTRIN) 800 MG tablet Take 1 tablet (800 mg total) by mouth every 8 (eight) hours as needed. 04/12/18   Hayden Rasmussen, MD     Allergies:     Allergies  Allergen Reactions  . Neosporin [Neomycin-Bacitracin Zn-Polymyx] Rash     Physical Exam:   Vitals  Blood pressure (!) 167/125, pulse 100, temperature 98.3 F (36.8 C), temperature source Oral, resp. rate (!) 22, height 6\' 1"  (1.854 m), weight 79.4 kg, SpO2 94 %.  1.  General: Appears in no acute distress  2. Psychiatric: Alert, oriented x3, no focal deficit noted  3. Neurologic: Cranial nerves II through XII grossly intact, motor strength 5/5 in all extremities.  No focal deficit noted.  4. HEENMT:  Atraumatic normocephalic, extraocular muscles are intact  5. Respiratory : Bibasilar crackles auscultated at lung bases  6. Cardiovascular : S1-S2, regular, no murmur auscultated  7. Gastrointestinal:  Abdomen is soft, nontender, no organomegaly  8. Skin:  No rashes noted      Data Review:    CBC Recent Labs  Lab 03/09/19 2208  WBC 4.3  HGB 11.0*  HCT 35.0*  PLT 155  MCV 89.3  MCH 28.1  MCHC 31.4  RDW 16.9*  LYMPHSABS 1.5  MONOABS 0.3  EOSABS 0.0  BASOSABS 0.0   ------------------------------------------------------------------------------------------------------------------  Results for orders placed or performed during the hospital encounter of 03/09/19 (from the past 48 hour(s))  Brain natriuretic peptide     Status: Abnormal   Collection Time: 03/09/19  8:31 PM  Result Value Ref Range   B Natriuretic Peptide 2,445.0 (H) 0.0 - 100.0 pg/mL    Comment: Performed at [facility], [address] [place], [place] [address]  Troponin I - Once     Status: Abnormal   Collection Time: 03/09/19  8:31 PM  Result Value Ref Range   Troponin I 0.13  (HH) <0.03 ng/mL    Comment: CRITICAL RESULT CALLED TO, READ BACK BY AND VERIFIED WITH: SALTPETT,J AT 2130 ON 4.30.20 BY ISLEY,B Performed at [facility], [address], [place], [place] [address]   ABO/Rh     Status: None   Collection Time: 03/09/19  8:31 PM  Result Value Ref Range   ABO/RH(D)      A POS Performed at [facility], [address], [place], [place] [address]   SARS Coronavirus 2 Ku [facility] order, Performed in [place] hospital lab)     Status: None   Collection Time: 03/09/19  8:33 PM  Result Value Ref Range   SARS Coronavirus 2 NEGATIVE NEGATIVE    Comment: (NOTE) If result is NEGATIVE SARS-CoV-2 target nucleic acids are NOT DETECTED. The SARS-CoV-2 RNA is generally detectable in upper and lower  respiratory specimens during the acute phase of infection. The lowest  concentration of SARS-CoV-2 viral copies this assay can detect is 250  copies / mL. A negative result does not preclude SARS-CoV-2 infection  and should not be used as the sole basis for treatment or other  patient management decisions.  A negative result may occur with  improper specimen collection / handling, submission of specimen other  than nasopharyngeal swab, presence of viral mutation(s) within the  areas targeted by this assay, and inadequate number of viral copies  (<250 copies / mL). A negative result must be combined with clinical  observations, patient history, and epidemiological information. If result is POSITIVE SARS-CoV-2 target nucleic acids are DETECTED. The SARS-CoV-2 RNA is generally detectable in upper and lower  respiratory specimens dur ing the acute phase of infection.  Positive  results are indicative of active infection with SARS-CoV-2.  Clinical  correlation with patient history and other diagnostic information is  necessary to determine patient infection status.  Positive results do  not rule out bacterial infection or co-infection with other viruses. If result is  PRESUMPTIVE POSTIVE SARS-CoV-2 nucleic acids MAY BE PRESENT.   A presumptive positive result was obtained on the submitted specimen  and confirmed on repeat testing.  While 2019 novel coronavirus  (SARS-CoV-2) nucleic acids may be present in the submitted sample  additional confirmatory testing may be necessary for epidemiological  and / or clinical management purposes  to differentiate between  SARS-CoV-2 and other Sarbecovirus currently known to infect  humans.  If clinically indicated additional testing with an alternate test  methodology [phone]) is advised. The SARS-CoV-2 RNA is generally  detectable in upper and lower respiratory sp ecimens during the acute  phase of infection. The expected result is Negative. Fact Sheet for Patients:  StrictlyIdeas.no Fact Sheet for Healthcare Providers: BankingDealers.co.za This test is not yet approved or cleared by the [place] FDA and has been authorized for detection and/or diagnosis of SARS-CoV-2 by FDA under an Emergency Use Authorization (EUA).  This EUA will remain in effect (meaning this test can be used) for the duration of the COVID-19 declaration under Section 564(b)(1) of the Act, 21 U.S.C. section 360bbb-3(b)(1), unless the authorization is terminated or revoked sooner. Performed at [facility], [address], [place], [place] [address]   CBG monitoring, ED     Status: None   Collection Time: 03/09/19  9:56 PM  Result Value Ref Range   Glucose-Capillary 91 70 - 99 mg/dL  Basic metabolic panel     Status: Abnormal   Collection Time: 03/09/19 10:08 PM  Result Value Ref Range   Sodium 137 135 - 145 mmol/L   Potassium 4.0 3.5 - 5.1 mmol/L   Chloride 106 98 - 111 mmol/L   CO2 23 22 - 32 mmol/L   Glucose, Bld 87 70 - 99 mg/dL   BUN 26 (H) 6 - 20 mg/dL   Creatinine, Ser 1.62 (H) 0.61 - 1.24 mg/dL   Calcium 8.0 (L) 8.9 - 10.3 mg/dL   GFR calc non Af Amer 54 (L) >60 mL/min    GFR calc Af Amer >60 >60 mL/min   Anion gap 8 5 - 15    Comment: Performed at [facility], [address], [place], [place] [address]  CBC with Differential     Status: Abnormal   Collection Time: 03/09/19 10:08 PM  Result Value Ref Range   WBC 4.3 4.0 - 10.5 K/uL   RBC 3.92 (L) 4.22 - 5.81 MIL/uL   Hemoglobin 11.0 (L) 13.0 - 17.0 g/dL   HCT 35.0 (L) 39.0 - 52.0 %   MCV 89.3 80.0 - 100.0 fL   MCH 28.1 26.0 - 34.0 pg   MCHC 31.4 30.0 - 36.0 g/dL   RDW 16.9 (H) 11.5 - 15.5 %   Platelets 155 150 - 400 K/uL   nRBC 0.0 0.0 - 0.2 %   Neutrophils Relative % 57 %   Neutro Abs 2.5 1.7 - 7.7 K/uL   Lymphocytes Relative 35 %   Lymphs Abs 1.5 0.7 - 4.0 K/uL   Monocytes Relative 7 %   Monocytes Absolute 0.3 0.1 - 1.0 K/uL   Eosinophils Relative 1 %   Eosinophils Absolute 0.0 0.0 - 0.5 K/uL   Basophils Relative 0 %   Basophils Absolute 0.0 0.0 - 0.1 K/uL   Immature Granulocytes 0 %   Abs Immature Granulocytes 0.01 0.00 - 0.07 K/uL    Comment: Performed at [facility], [address] [place], [place] [address]    Chemistries  Recent Labs  Lab 03/09/19 2208  NA 137  K 4.0  CL 106  CO2 23  GLUCOSE 87  BUN 26*  CREATININE 1.62*  CALCIUM 8.0*   ------------------------------------------------------------------------------------------------------------------  ------------------------------------------------------------------------------------------------------------------ GFR: Estimated Creatinine Clearance: 70.8 mL/min (A) (by C-G formula based on SCr of 1.62 mg/dL (H)). Liver Function Tests: No results for input(s): AST, ALT, ALKPHOS, BILITOT, PROT, ALBUMIN in the last 168 hours. No results for input(s): LIPASE, AMYLASE in the last 168 hours.  No results for input(s): AMMONIA in the last 168 hours. Coagulation Profile: No results for input(s): INR, PROTIME in the last 168 hours. Cardiac Enzymes: Recent Labs  Lab 03/09/19 2031  TROPONINI 0.13*   BNP (last 3 results) No  results for input(s): PROBNP in the last 8760 hours. HbA1C: No results for input(s): HGBA1C in the last 72 hours. CBG: Recent Labs  Lab 03/09/19 2156  GLUCAP 91   Lipid Profile: No results for input(s): CHOL, HDL, LDLCALC, TRIG, CHOLHDL, LDLDIRECT in the last 72 hours. Thyroid Function Tests: No results for input(s): TSH, T4TOTAL, FREET4, T3FREE, THYROIDAB in the last 72 hours. Anemia Panel: No results for input(s): VITAMINB12, FOLATE, FERRITIN, TIBC, IRON, RETICCTPCT in the last 72 hours.  --------------------------------------------------------------------------------------------------------------- Urine analysis:    Component Value Date/Time   COLORURINE YELLOW 04/12/2018 1807   APPEARANCEUR CLEAR 04/12/2018 1807   LABSPEC 1.027 04/12/2018 1807   PHURINE 5.0 04/12/2018 1807   GLUCOSEU NEGATIVE 04/12/2018 1807   HGBUR MODERATE (A) 04/12/2018 1807   BILIRUBINUR NEGATIVE 04/12/2018 1807   KETONESUR NEGATIVE 04/12/2018 1807   PROTEINUR 100 (A) 04/12/2018 1807   NITRITE NEGATIVE 04/12/2018 1807   LEUKOCYTESUR NEGATIVE 04/12/2018 1807      Imaging Results:    Dg Chest Port 1 View  Result Date: 03/09/2019 CLINICAL DATA:  Initial evaluation for acute shortness of breath, hypertension, lower extremity swelling. EXAM: PORTABLE CHEST 1 VIEW COMPARISON:  None available. FINDINGS: Cardiomegaly. Mediastinal silhouette within normal limits. Lungs normally inflated. Diffuse pulmonary vascular congestion with interstitial prominence and scattered Kerley B-lines, compatible with pulmonary interstitial edema. Asymmetric hazy opacity overlying the left lung favored to be secondary to patient positioning as the patient is slightly rotated to the left, although superimposed infiltrate could be considered in the correct clinical setting. No appreciable pleural effusion. No pneumothorax. No acute osseous finding. IMPRESSION: 1. Cardiomegaly with mild diffuse pulmonary interstitial edema. 2.  Asymmetric hazy opacity within the mid left lung, favored to be related to patient positioning and/or asymmetric edema. Superimposed infection would be difficult to exclude, and could be considered in the correct clinical setting. Electronically Signed   By: Jeannine Boga M.D.   On: 03/09/2019 20:00    My personal review of EKG: Normal sinus rhythm, T wave inversions in inferior leads, bifid P waves,   Assessment & Plan:    Active Problems:   Acute exacerbation of CHF (congestive heart failure) ([place])   1. Hypertensive urgency-patient presenting with hypertensive urgency, has been noncompliant with antihypertensive medications for past 3 years.  Started on IV nitroglycerin, will start amlodipine 10 mg p.o. daily.  2. Acute CHF exacerbation-no previous history of CHF.  Chest x-ray showed pulmonary edema.  BNP significantly elevated.  Started on IV nitroglycerin as above.  Will obtain echocardiogram in a.m.  Strict intake and output.  Daily BMPs.  Daily weights.  Lasix 40 mg IV every 12 hours.  3. Elevated troponin-likely from demand ischemia from above.  Did have left-sided chest pain for a short while.  Denies pain at this time.  Will cycle troponin every 6 hours x3.  EKG shows multiple abnormalities and findings consistent with left ventricular hypertrophy.  Will obtain echocardiogram as above.  Consult cardiology in a.m.  4. Hypertension-patient has been noncompliant with his medications.  Start amlodipine 10 mg p.o. daily    DVT Prophylaxis-   Lovenox   AM Labs Ordered, also please review Full Orders  Family Communication: Admission, patients condition and plan of care including tests being ordered  have been discussed with the patient  who indicate understanding and agree with the plan and Code Status.  Code Status: Full code  Admission status: Observation/Inpatient: Based on patients clinical presentation and evaluation of above clinical data, I have made determination that  patient meets Inpatient criteria at this time.  Time spent in minutes : 60 minutes   Oswald Hillock M.D on 03/10/2019 at 1:12 AM

## 2019-03-10 NOTE — Consult Note (Signed)
Cardiology Consultation:   Patient ID: Richard Cox; [id number]; 12-Apr-1981   Admit date: 03/09/2019 Date of Consult: 03/10/2019  Primary Care Provider: Patient, No Pcp Per Primary Cardiologist: New   Patient Profile:   Richard Cox is a 38 y.o. male with a history of longstanding hypertension but medication compliance and no regular follow-up for at least 4 years who is being seen today for the evaluation of heart failure symptoms and uncontrolled hypertension at the request of Dr. Wynetta Emery.  History of Present Illness:   Richard Cox presents to the hospital reporting worsening shortness of breath and chest tightness, particularly within the last few days, although he has been short of breath with activity for several weeks to months intermittently.  He is also noticed ankle and foot edema within the last few days.  He tells me that he has had hypertension for many years, was previously on a combination medication about 4 years ago when he was living in [place].  He does not recall the specific name of the medication.  He does not recall ever having undergone a previous echocardiogram or being told that he has a cardiomyopathy.  No obvious family history of sudden cardiac death or cardiomyopathy.  He has been living in this area for the last 4 years, not taking any medications during that time or having regular follow-up of his blood pressure.  He works in Editor, commissioning for [employer].  He does have a prior history of significant alcohol use but quit back in January, also quit smoking at that time.  In the past he used marijuana but denies any other drug use, specifically no cocaine or heroin.  He does not report any productive cough, no chills.  He does have regular trouble with allergies in the spring.  Consistently afebrile until temperature check today at 101.1 degrees.  SARS coronavirus 2 test was negative yesterday.  Chest x-ray from yesterday reported cardiomegaly with mild diffuse  interstitial edema.  Also asymmetric opacity in the mid left lung.  I personally reviewed his recent ECGs which show sinus rhythm with increased voltage in the mid precordial leads, but otherwise normal voltage.   Past Medical History:  Diagnosis Date   Hypertension    Noncompliance with medication regimen     Past Surgical History:  Procedure Laterality Date   NO PAST SURGERIES       Inpatient Medications: Scheduled Meds:  amLODipine  10 mg Oral Daily   carvedilol  3.125 mg Oral BID WC   doxycycline  100 mg Oral Q12H   enoxaparin (LOVENOX) injection  40 mg Subcutaneous Q24H   furosemide  40 mg Intravenous Q12H   sodium chloride flush  3 mL Intravenous Q12H   Continuous Infusions:  sodium chloride     PRN Meds: sodium chloride, acetaminophen, hydrALAZINE, ondansetron (ZOFRAN) IV, sodium chloride flush  Allergies:    Allergies  Allergen Reactions   Neosporin [Neomycin-Bacitracin Zn-Polymyx] Rash    Social History:   Social History   Socioeconomic History   Marital status: Single    Spouse name: Not on file   Number of children: Not on file   Years of education: Not on file   Highest education level: Not on file  Occupational History   Not on file  Social Needs   Financial resource strain: Not on file   Food insecurity:    Worry: Not on file    Inability: Not on file   Transportation needs:    Medical: Not  on file    Non-medical: Not on file  Tobacco Use   Smoking status: Former Smoker    Packs/day: 1.00    Types: Cigarettes    Last attempt to quit: 11/23/2018    Years since quitting: 0.2   Smokeless tobacco: Never Used  Substance and Sexual Activity   Alcohol use: Not Currently    Comment: quit alcohol 4 months ago    Drug use: Not Currently    Types: Marijuana    Comment: Quit about a year ago   Sexual activity: Not on file  Lifestyle   Physical activity:    Days per week: Not on file    Minutes per session: Not on file     Stress: Not on file  Relationships   Social connections:    Talks on phone: Not on file    Gets together: Not on file    Attends religious service: Not on file    Active member of club or organization: Not on file    Attends meetings of clubs or organizations: Not on file    Relationship status: Not on file   Intimate partner violence:    Fear of current or ex partner: Not on file    Emotionally abused: Not on file    Physically abused: Not on file    Forced sexual activity: Not on file  Other Topics Concern   Not on file  Social History Narrative   Not on file    Family History:   The patient's family history includes Cancer in his father and mother; Diabetes Mellitus II in his mother.  ROS:  Please see the history of present illness.  All other ROS reviewed and negative.     Physical Exam/Data:   Vitals:   03/10/19 0900 03/10/19 1000 03/10/19 1100 03/10/19 1120  BP: (!) 157/95 (!) 151/90 (!) 159/98   Pulse: 97 (!) 102 (!) 106   Resp: (!) 22 (!) 31 (!) 26   Temp:    (!) 101.1 F (38.4 C)  TempSrc:    Oral  SpO2: 95% (!) 89% 93%   Weight:      Height:        Intake/Output Summary (Last 24 hours) at 03/10/2019 1238 Last data filed at 03/10/2019 1100 Gross per 24 hour  Intake 394.63 ml  Output 3675 ml  Net -3280.37 ml   Filed Weights   03/09/19 1924 03/10/19 0229  Weight: 79.4 kg 72.8 kg   Body mass index is 21.17 kg/m.   Gen: Patient is in no acute distress.Marland Kitchen HEENT: Conjunctiva and lids normal, oropharynx clear. Neck: Supple, no elevated JVP or carotid bruits, no thyromegaly. Lungs: Scattered rhonchi, no wheezing. Cardiac: Regular rate and rhythm, S4 with 2/6 apical mid systolic murmur, no pericardial rub. Abdomen: Soft, nontender, bowel sounds present, no guarding or rebound. Extremities: Bilateral foot and ankle edema, distal pulses 2+. Skin: Warm and dry.  No rashes. Musculoskeletal: No kyphosis. Neuropsychiatric: Alert and oriented x3, affect  grossly appropriate.  Telemetry:  I personally reviewed telemetry which shows sinus rhythm and sinus tachycardia.  Relevant CV Studies:   1. The left ventricle has mild-moderately reduced systolic function, with an ejection fraction of 40-45%. The cavity size was normal. There is severely increased left ventricular wall thickness. Left ventricular diastolic Doppler parameters are  consistent with pseudonormalization. Left ventricular diffuse hypokinesis. Although possibly consistent with hypertensive cardiomyopathy in light of reported noncompliance with medical therapy and uncontrolled hypertension, the degree of LVH and  associated atrial enlargement also suggest a hypertrophic cardiomyopathy and the possibility of an infiltrative process.  2. The right ventricle has normal systolic function. The cavity was normal. There is moderately increased right ventricular wall thickness. Right ventricular systolic pressure normal with an estimated pressure of 33.7 mmHg.  3. Left atrial size was severely dilated.  4. Right atrial size was moderately dilated.  5. Small to moderate pericardial effusion. Mitral outflow pattern does not suggest tamponade physiology.  6. The pericardial effusion is circumferential.  7. The tricuspid valve is grossly normal.  8. The aortic valve is tricuspid.  9. The aortic root is normal in size and structure. 10. The inferior vena cava was dilated in size with >50% respiratory variability. 11. The mitral valve is grossly normal.  Laboratory Data:  Chemistry Recent Labs  Lab 03/09/19 2208  NA 137  K 4.0  CL 106  CO2 23  GLUCOSE 87  BUN 26*  CREATININE 1.62*  CALCIUM 8.0*  GFRNONAA 54*  GFRAA >60  ANIONGAP 8    No results for input(s): PROT, ALBUMIN, AST, ALT, ALKPHOS, BILITOT in the last 168 hours. Hematology Recent Labs  Lab 03/09/19 2208 03/10/19 0301  WBC 4.3 5.5  RBC 3.92* 3.46*  HGB 11.0* 9.6*  HCT 35.0* 30.5*  MCV 89.3 88.2  MCH 28.1 27.7    MCHC 31.4 31.5  RDW 16.9* 16.6*  PLT 155 172   Cardiac Enzymes Recent Labs  Lab 03/09/19 2031 03/10/19 0301 03/10/19 0908  TROPONINI 0.13* 0.14* 0.17*   No results for input(s): TROPIPOC in the last 168 hours.  BNP Recent Labs  Lab 03/09/19 2031  BNP 2,445.0*    DDimer No results for input(s): DDIMER in the last 168 hours.  Radiology/Studies:  Dg Chest Port 1 View  Result Date: 03/09/2019 CLINICAL DATA:  Initial evaluation for acute shortness of breath, hypertension, lower extremity swelling. EXAM: PORTABLE CHEST 1 VIEW COMPARISON:  None available. FINDINGS: Cardiomegaly. Mediastinal silhouette within normal limits. Lungs normally inflated. Diffuse pulmonary vascular congestion with interstitial prominence and scattered Kerley B-lines, compatible with pulmonary interstitial edema. Asymmetric hazy opacity overlying the left lung favored to be secondary to patient positioning as the patient is slightly rotated to the left, although superimposed infiltrate could be considered in the correct clinical setting. No appreciable pleural effusion. No pneumothorax. No acute osseous finding. IMPRESSION: 1. Cardiomegaly with mild diffuse pulmonary interstitial edema. 2. Asymmetric hazy opacity within the mid left lung, favored to be related to patient positioning and/or asymmetric edema. Superimposed infection would be difficult to exclude, and could be considered in the correct clinical setting. Electronically Signed   By: Jeannine Boga M.D.   On: 03/09/2019 20:00    Assessment and Plan:   1.  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  This is in the setting of uncontrolled and severe hypertension with medication noncompliance for at least 4 years.  Although "burned out" hypertensive cardiomyopathy is possible, the patient's echocardiogram is also very concerning for a hypertrophic cardiomyopathy,  potentially an infiltrative disorder such as amyloidosis.  It is not clear that he has had any previous cardiac evaluations, but there is no information available for review in terms of prior medical assessments.  No obvious family history reported of cardiomyopathy or sudden cardiac death.  No recent reported substance abuse.  2.  Uncontrolled severe hypertension.  Coreg and Norvasc by primary team.  3.  Renal insufficiency, creatinine 1.62  with unknown baseline.  Routine urinalysis reported 100 mg/dL protein.  4.  Prior history of alcohol abuse, quit drinking in January.  5.  Tobacco abuse in remission.  6.  Mild troponin elevation in flat pattern from 0.13-0.17 most likely consistent with demand ischemia in the setting of above.  7.  Temperature of 101.3 degrees, did not present febrile.  SARS coronavirus 2 test was negative yesterday.  His chest x-ray does show asymmetric opacity on the left raising possibility of associated infectious process.  Increase Coreg to 6.25 mg twice daily, continue Norvasc for now, add hydralazine/nitrate combination.  Would not pursue ACE inhibitor/ARB/AN RI/Aldactone until course of renal dysfunction is better clarified.  Continue to diurese with IV Lasix, but follow renal function carefully in the process.  Would suggest TSH, LFTs, 24-hour urine for protein, UPEP and light chains, SPEP.  Medical regimen can be gradually uptitrated to achieve better blood pressure trend.  He is going to need a cardiac MRI and PYP scan eventually.  Depending on his clinical course with the above, this might be able to be accomplished via Advanced Failure Heart team consultation as an outpatient, but I would not hesitate to have him transferred to [facility] if he does not show significant improvement in the short-term.  Signed, Rozann Lesches, MD  03/10/2019 12:38 PM

## 2019-03-10 NOTE — Progress Notes (Signed)
*  PRELIMINARY RESULTS* Echocardiogram 2D Echocardiogram has been performed.  Richard Cox 03/10/2019, 10:29 AM

## 2019-03-10 NOTE — TOC Initial Note (Addendum)
Transition of Care [facility]) - Initial/Assessment Note    Patient Details  Name: Richard Cox MRN: [id number] Date of Birth: 1981-04-14  [facility] CM/SW Contact:    Trish Mage, LCSW Phone Number: 03/10/2019, 12:11 PM  Clinical Narrative:    38 YO AA male diagnosed with HTN and CHF.  States he has been living with brother on border of [place] and [place]; parents live next door.  He was living in [facility] for 7 years, but then returned home three years ago, and is employed at [employer], but opted not to get insurance, and does not have a PCP.  He has no issues with ambulation and ADL's, has tranportation.  States he has not been sleeping well as he has to sleep in an upright position, but is hopeful that will be resolved post d/c.  Is willing to see a PCP, and in fact, we called and got an appointment for him at [place]. Has a concern that medication will be expensive.  Told him we would supply with a one time [facility] voucher for meds  not on the formulary. Voucher given. Initial search by CSW revealed current meds are all $4.00 at [facility]. Thanks to help from RN [employer].  CSW sign off.             Expected Discharge Plan: Home/Self Care Barriers to Discharge: No Barriers Identified   Patient Goals and CMS Choice Patient states their goals for this hospitalization and ongoing recovery are:: Get on medication that helps      Expected Discharge Plan and Services Expected Discharge Plan: Home/Self Care In-house Referral: PCP / [facility] Discharge Planning Services: [facility] [facility]   Living arrangements for the past 2 months: Single Family Home                                      Prior Living Arrangements/Services Living arrangements for the past 2 months: Single Family Home Lives with:: Siblings Patient language and need for interpreter reviewed:: Yes Do you feel safe going back to the place where you live?: Yes      Need for  Family Participation in Patient Care: No (Comment) Care giver support system in place?: Yes (comment)   Criminal Activity/Legal Involvement Pertinent to Current Situation/Hospitalization: No - Comment as needed  Activities of Daily Living Home Assistive Devices/Equipment: None ADL Screening (condition at time of admission) Patient's cognitive ability adequate to safely complete daily activities?: Yes Is the patient deaf or have difficulty hearing?: No Does the patient have difficulty seeing, even when wearing glasses/contacts?: No Does the patient have difficulty concentrating, remembering, or making decisions?: No Patient able to express need for assistance with ADLs?: Yes Does the patient have difficulty dressing or bathing?: No Independently performs ADLs?: Yes (appropriate for developmental age) Does the patient have difficulty walking or climbing stairs?: No Weakness of Legs: None Weakness of Arms/Hands: None  Permission Sought/Granted Permission sought to share information with : PCP                Emotional Assessment Appearance:: Appears stated age Attitude/Demeanor/Rapport: Engaged Affect (typically observed): Appropriate Orientation: : Oriented to Self, Oriented to Place, Oriented to  Time, Oriented to Situation Alcohol / Substance Use: Not Applicable Psych Involvement: No (comment)  Admission diagnosis:  SOB (shortness of breath) [R06.02] Hypertensive urgency [I16.0] Congestive heart failure, unspecified HF  chronicity, unspecified heart failure type [facility]) [I50.9] Patient Active Problem List   Diagnosis Date Noted  . Acute exacerbation of CHF (congestive heart failure) ([place]) 03/10/2019  . Hypertensive urgency 03/10/2019  . Congestive heart failure ([place]) 03/10/2019  . SOB (shortness of breath) 03/10/2019  . CKD (chronic kidney disease) 03/10/2019  . Noncompliance w/medication treatment due to intermit use of medication 03/10/2019   PCP:  Patient, No Pcp  Per Pharmacy:   [id number] - [place], [address] [place] [place] [address] Phone: [phone] Fax: [phone]     Social Determinants of Health (SDOH) Interventions    Readmission Risk Interventions No flowsheet data found.

## 2019-03-10 NOTE — Progress Notes (Signed)
24 hour urine started at 1700.

## 2019-03-11 DIAGNOSIS — N189 Chronic kidney disease, unspecified: Secondary | ICD-10-CM

## 2019-03-11 DIAGNOSIS — I5023 Acute on chronic systolic (congestive) heart failure: Secondary | ICD-10-CM

## 2019-03-11 LAB — CBC WITH DIFFERENTIAL/PLATELET
Abs Immature Granulocytes: 0.01 10*3/uL (ref 0.00–0.07)
Basophils Absolute: 0 10*3/uL (ref 0.0–0.1)
Basophils Relative: 0 %
Eosinophils Absolute: 0 10*3/uL (ref 0.0–0.5)
Eosinophils Relative: 0 %
HCT: 32.4 % — ABNORMAL LOW (ref 39.0–52.0)
Hemoglobin: 10.2 g/dL — ABNORMAL LOW (ref 13.0–17.0)
Immature Granulocytes: 0 %
Lymphocytes Relative: 36 %
Lymphs Abs: 1.8 10*3/uL (ref 0.7–4.0)
MCH: 27.6 pg (ref 26.0–34.0)
MCHC: 31.5 g/dL (ref 30.0–36.0)
MCV: 87.6 fL (ref 80.0–100.0)
Monocytes Absolute: 0.4 10*3/uL (ref 0.1–1.0)
Monocytes Relative: 8 %
Neutro Abs: 2.7 10*3/uL (ref 1.7–7.7)
Neutrophils Relative %: 56 %
Platelets: 186 10*3/uL (ref 150–400)
RBC: 3.7 MIL/uL — ABNORMAL LOW (ref 4.22–5.81)
RDW: 16 % — ABNORMAL HIGH (ref 11.5–15.5)
WBC: 5 10*3/uL (ref 4.0–10.5)
nRBC: 0 % (ref 0.0–0.2)

## 2019-03-11 LAB — BLOOD CULTURE ID PANEL (REFLEXED)

## 2019-03-11 LAB — PROTEIN, URINE, 24 HOUR
Collection Interval-UPROT: 24 hours
Protein, Urine: 63 mg/dL
Urine Total Volume-UPROT: 2400 mL

## 2019-03-11 LAB — BASIC METABOLIC PANEL
Anion gap: 8 (ref 5–15)
BUN: 23 mg/dL — ABNORMAL HIGH (ref 6–20)
CO2: 25 mmol/L (ref 22–32)
Calcium: 8 mg/dL — ABNORMAL LOW (ref 8.9–10.3)
Chloride: 103 mmol/L (ref 98–111)
Creatinine, Ser: 1.24 mg/dL (ref 0.61–1.24)
GFR calc Af Amer: >60 mL/min (ref >60)
GFR calc non Af Amer: >60 mL/min (ref >60)
Glucose, Bld: 94 mg/dL (ref 70–99)
Potassium: 3.5 mmol/L (ref 3.5–5.1)
Sodium: 136 mmol/L (ref 135–145)

## 2019-03-11 LAB — VITAMIN B12: Vitamin B-12: 263 pg/mL (ref 180–914)

## 2019-03-11 LAB — LIPID PANEL
Cholesterol: 182 mg/dL (ref 0–200)
HDL: 18 mg/dL — ABNORMAL LOW (ref >40)
LDL Cholesterol: 127 mg/dL — ABNORMAL HIGH (ref 0–99)
Total CHOL/HDL Ratio: 10.1 RATIO
Triglycerides: 185 mg/dL — ABNORMAL HIGH (ref <150)
VLDL: 37 mg/dL (ref 0–40)

## 2019-03-11 LAB — RETICULOCYTES
Immature Retic Fract: 12.5 % (ref 2.3–15.9)
RBC.: 3.88 MIL/uL — ABNORMAL LOW (ref 4.22–5.81)
Retic Count, Absolute: 70.2 10*3/uL (ref 19.0–186.0)
Retic Ct Pct: 1.8 % (ref 0.4–3.1)

## 2019-03-11 LAB — IRON AND TIBC
Iron: 27 ug/dL — ABNORMAL LOW (ref 45–182)
Saturation Ratios: 11 % — ABNORMAL LOW (ref 17.9–39.5)
TIBC: 239 ug/dL — ABNORMAL LOW (ref 250–450)
UIBC: 212 ug/dL

## 2019-03-11 LAB — FOLATE: Folate: 14 ng/mL (ref >5.9)

## 2019-03-11 LAB — FERRITIN: Ferritin: 123 ng/mL (ref 24–336)

## 2019-03-11 MED ORDER — HYDRALAZINE HCL 25 MG PO TABS
25.0000 mg | ORAL_TABLET | Freq: Three times a day (TID) | ORAL | Status: DC
  Administered 2019-03-11 – 2019-03-13 (×7): 25 mg via ORAL
  Filled 2019-03-11 (×7): qty 1

## 2019-03-11 MED ORDER — CARVEDILOL 12.5 MG PO TABS
12.5000 mg | ORAL_TABLET | Freq: Two times a day (BID) | ORAL | Status: DC
  Administered 2019-03-11 – 2019-03-13 (×6): 12.5 mg via ORAL
  Filled 2019-03-11 (×6): qty 1

## 2019-03-11 MED ORDER — AMOXICILLIN-POT CLAVULANATE 875-125 MG PO TABS
1.0000 | ORAL_TABLET | Freq: Two times a day (BID) | ORAL | Status: DC
  Administered 2019-03-11 – 2019-03-13 (×5): 1 via ORAL
  Filled 2019-03-11 (×5): qty 1

## 2019-03-11 NOTE — Progress Notes (Addendum)
PROGRESS NOTE    Richard Cox  AOZ:[id number]  DOB: [birth year]  DOA: 03/09/2019 PCP: Patient, No Pcp Per   Brief Admission Hx: 38 year old gentleman with a malignant hypertension that had been untreated for the past several years presented to the emergency department with acute onset shortness of breath and noted to be in hypertensive urgency with an acute congestive heart failure exacerbation.  MDM/Assessment & Plan:   1. Acute systolic CHF exacerbation-his echo is suggestive of a cardiomyopathy likely secondary to years of poorly controlled hypertension.  He has been seen by the cardiology service and he will need further work-up for this which is in process.  He has a 24-hour urine pending.  He will need outpatient follow with the advanced heart failure team.  We are working to improve his blood pressures.  He is being diuresed with IV Lasix. 2. Hypertensive urgency- this is been treated.  He has been started on carvedilol, amlodipine, Imdur, hydralazine and we have been titrating the doses for better blood pressure control.  The case management team has been working with him to make arrangements to assist him with his medications.  He also reports that he is working and cannot afford medications if they are on the $4 list at [employer]. 3. CAP-his chest x-ray suggestive of pneumonia.  He has had fever and we have started him on oral doxycycline however he had a blood culture that was positive for anaerobes and we have switched him over to oral Augmentin.  His fever has improved.  We continue to monitor blood cultures. 4. Iron Deficiency Anemia - Add ferrous sulfate with breakfast.  5. Stage 2 CKD   DVT prophylaxis: Lovenox Code Status: Full Family Communication: Patient preferred I do not speak with his parents, he will update them Disposition Plan: Move to telemetry   Consultants:  Cardiology  Procedures:  Echocardiogram IMPRESSIONS  1. The left ventricle has mild-moderately  reduced systolic function, with an ejection fraction of 40-45%. The cavity size was normal. There is severely increased left ventricular wall thickness. Left ventricular diastolic Doppler parameters are  consistent with pseudonormalization. Left ventricular diffuse hypokinesis. Although possibly consistent with hypertensive cardiomyopathy in light of reported noncompliance with medical therapy and uncontrolled hypertension, the degree of LVH and  associated atrial enlargement also suggest a hypertrophic cardiomyopathy and the possibility of an infiltrative process.  2. The right ventricle has normal systolic function. The cavity was normal. There is moderately increased right ventricular wall thickness. Right ventricular systolic pressure normal with an estimated pressure of 33.7 mmHg.  3. Left atrial size was severely dilated.  4. Right atrial size was moderately dilated.  5. Small to moderate pericardial effusion. Mitral outflow pattern does not suggest tamponade physiology.  6. The pericardial effusion is circumferential.  7. The tricuspid valve is grossly normal.  8. The aortic valve is tricuspid.  9. The aortic root is normal in size and structure. 10. The inferior vena cava was dilated in size with >50% respiratory variability. 11. The mitral valve is grossly normal.  Antimicrobials:  augmentin 5/2 >    Subjective: Patient says he is feeling better he does have occasional chest discomfort however overall has improved considerably.  Edema in feet is much better.   Objective: Vitals:   03/11/19 0800 03/11/19 0900 03/11/19 0950 03/11/19 1000  BP:  (!) 166/121 (!) 168/116 (!) 157/115  Pulse: 98 95 (!) 102 98  Resp: 15 (!) 27    Temp:      TempSrc:  SpO2: 96% 98%  94%  Weight:      Height:        Intake/Output Summary (Last 24 hours) at 03/11/2019 1244 Last data filed at 03/11/2019 0800 Gross per 24 hour  Intake 598.17 ml  Output 5425 ml  Net -4826.83 ml   Filed Weights    03/09/19 1924 03/10/19 0229  Weight: 79.4 kg 72.8 kg   REVIEW OF SYSTEMS  As per history otherwise all reviewed and reported negative  Exam:  General exam: thin male, awake, alert, NAD, cooperative.  Respiratory system: Clear. No increased work of breathing. Cardiovascular system: normal S1 & S2 heard. No JVD, murmurs, gallops, clicks or pedal edema. Gastrointestinal system: Abdomen is nondistended, soft and nontender. Normal bowel sounds heard. Central nervous system: Alert and oriented. No focal neurological deficits. Extremities: trace edema bilateral LEs.  Data Reviewed: Basic Metabolic Panel: Recent Labs  Lab 03/09/19 2208 03/11/19 0409  NA 137 136  K 4.0 3.5  CL 106 103  CO2 23 25  GLUCOSE 87 94  BUN 26* 23*  CREATININE 1.62* 1.24  CALCIUM 8.0* 8.0*   Liver Function Tests: Recent Labs  Lab 03/10/19 1328  AST 30  ALT 28  ALKPHOS 93  BILITOT 0.6  PROT 7.5  ALBUMIN 2.4*   No results for input(s): LIPASE, AMYLASE in the last 168 hours. No results for input(s): AMMONIA in the last 168 hours. CBC: Recent Labs  Lab 03/09/19 2208 03/10/19 0301 03/11/19 0409  WBC 4.3 5.5 5.0  NEUTROABS 2.5 3.3 2.7  HGB 11.0* 9.6* 10.2*  HCT 35.0* 30.5* 32.4*  MCV 89.3 88.2 87.6  PLT 155 172 186   Cardiac Enzymes: Recent Labs  Lab 03/09/19 2031 03/10/19 0301 03/10/19 0908 03/10/19 1328  TROPONINI 0.13* 0.14* 0.17* 0.18*   CBG (last 3)  Recent Labs    03/09/19 2156  GLUCAP 91   Recent Results (from the past 240 hour(s))  SARS Coronavirus 2 [facility] order, Performed in [facility] hospital lab)     Status: None   Collection Time: 03/09/19  8:33 PM  Result Value Ref Range Status   SARS Coronavirus 2 NEGATIVE NEGATIVE Final    Comment: (NOTE) If result is NEGATIVE SARS-CoV-2 target nucleic acids are NOT DETECTED. The SARS-CoV-2 RNA is generally detectable in upper and lower  respiratory specimens during the acute phase of infection. The lowest  concentration  of SARS-CoV-2 viral copies this assay can detect is 250  copies / mL. A negative result does not preclude SARS-CoV-2 infection  and should not be used as the sole basis for treatment or other  patient management decisions.  A negative result may occur with  improper specimen collection / handling, submission of specimen other  than nasopharyngeal swab, presence of viral mutation(s) within the  areas targeted by this assay, and inadequate number of viral copies  (<250 copies / mL). A negative result must be combined with clinical  observations, patient history, and epidemiological information. If result is POSITIVE SARS-CoV-2 target nucleic acids are DETECTED. The SARS-CoV-2 RNA is generally detectable in upper and lower  respiratory specimens dur ing the acute phase of infection.  Positive  results are indicative of active infection with SARS-CoV-2.  Clinical  correlation with patient history and other diagnostic information is  necessary to determine patient infection status.  Positive results do  not rule out bacterial infection or co-infection with other viruses. If result is PRESUMPTIVE POSTIVE SARS-CoV-2 nucleic acids MAY BE PRESENT.   A presumptive positive  result was obtained on the submitted specimen  and confirmed on repeat testing.  While 2019 novel coronavirus  (SARS-CoV-2) nucleic acids may be present in the submitted sample  additional confirmatory testing may be necessary for epidemiological  and / or clinical management purposes  to differentiate between  SARS-CoV-2 and other Sarbecovirus currently known to infect humans.  If clinically indicated additional testing with an alternate test  methodology [phone]) is advised. The SARS-CoV-2 RNA is generally  detectable in upper and lower respiratory sp ecimens during the acute  phase of infection. The expected result is Negative. Fact Sheet for Patients:  StrictlyIdeas.no Fact Sheet for Healthcare  Providers: BankingDealers.co.za This test is not yet approved or cleared by the [place] FDA and has been authorized for detection and/or diagnosis of SARS-CoV-2 by FDA under an Emergency Use Authorization (EUA).  This EUA will remain in effect (meaning this test can be used) for the duration of the COVID-19 declaration under Section 564(b)(1) of the Act, 21 U.S.C. section 360bbb-3(b)(1), unless the authorization is terminated or revoked sooner. Performed at [facility], [address], [place], [place] [address]   MRSA PCR Screening     Status: None   Collection Time: 03/10/19  2:28 AM  Result Value Ref Range Status   MRSA by PCR NEGATIVE NEGATIVE Final    Comment:        The GeneXpert MRSA Assay (FDA approved for NASAL specimens only), is one component of a comprehensive MRSA colonization surveillance program. It is not intended to diagnose MRSA infection nor to guide or monitor treatment for MRSA infections. Performed at [facility], [address] [place], [place] [address]   Culture, blood (Routine X 2) w Reflex to ID Panel     Status: None (Preliminary result)   Collection Time: 03/10/19  1:24 PM  Result Value Ref Range Status   Specimen Description BLOOD RIGHT HAND  Final   Special Requests   Final    BOTTLES DRAWN AEROBIC AND ANAEROBIC Blood Culture adequate volume   Culture   Final    NO GROWTH < 24 HOURS Performed at [facility], [address] [place], [place] [address]    Report Status PENDING  Incomplete  Culture, blood (Routine X 2) w Reflex to ID Panel     Status: None (Preliminary result)   Collection Time: 03/10/19  1:25 PM  Result Value Ref Range Status   Specimen Description   Final    BLOOD LEFT HAND Performed at [facility], [address] [place], [place] [address]    Special Requests   Final    BOTTLES DRAWN AEROBIC AND ANAEROBIC Blood Culture adequate volume Performed at [facility], [address], [place],  [place] [address]    Culture  Setup Time   Final    GRAM POSITIVE COCCI ANAEROBIC Gram Stain Report Called to,Read Back By and Verified With: DANIELS J. @ 0711 ON [id number] BY HENERSON L Organism ID to follow Performed at [facility], [place] [address] [place],  [address]    Culture PENDING  Incomplete   Report Status PENDING  Incomplete     Studies: Dg Chest Port 1 View  Result Date: 03/10/2019 CLINICAL DATA:  Chest pain and shortness of breath. Lower extremity swelling. Fever and cough. EXAM: PORTABLE CHEST 1 VIEW COMPARISON:  03/09/2019 FINDINGS: There is persistent cardiomegaly. Pulmonary vascularity is normal. There is a hazy infiltrate in the left midzone, essentially unchanged. There is prominent peribronchial thickening on the  right, unchanged. No effusions. IMPRESSION: Persistent hazy infiltrate in the left midzone. Persistent bronchitic changes. Persistent cardiomegaly. Electronically Signed   By: Lorriane Shire M.D.   On: 03/10/2019 12:50   Dg Chest Port 1 View  Result Date: 03/09/2019 CLINICAL DATA:  Initial evaluation for acute shortness of breath, hypertension, lower extremity swelling. EXAM: PORTABLE CHEST 1 VIEW COMPARISON:  None available. FINDINGS: Cardiomegaly. Mediastinal silhouette within normal limits. Lungs normally inflated. Diffuse pulmonary vascular congestion with interstitial prominence and scattered Kerley B-lines, compatible with pulmonary interstitial edema. Asymmetric hazy opacity overlying the left lung favored to be secondary to patient positioning as the patient is slightly rotated to the left, although superimposed infiltrate could be considered in the correct clinical setting. No appreciable pleural effusion. No pneumothorax. No acute osseous finding. IMPRESSION: 1. Cardiomegaly with mild diffuse pulmonary interstitial edema. 2. Asymmetric hazy opacity within the mid left lung, favored to be related to patient positioning and/or asymmetric edema. Superimposed  infection would be difficult to exclude, and could be considered in the correct clinical setting. Electronically Signed   By: Jeannine Boga M.D.   On: 03/09/2019 20:00   Scheduled Meds:  amLODipine  10 mg Oral Daily   amoxicillin-clavulanate  1 tablet Oral Q12H   carvedilol  12.5 mg Oral BID WC   enoxaparin (LOVENOX) injection  40 mg Subcutaneous Q24H   furosemide  40 mg Intravenous Q12H   hydrALAZINE  25 mg Oral Q8H   isosorbide mononitrate  30 mg Oral Daily   sodium chloride flush  3 mL Intravenous Q12H   Continuous Infusions:  sodium chloride     Principal Problem:   Acute exacerbation of CHF (congestive heart failure) (HCC) Active Problems:   Hypertensive urgency   Congestive heart failure (HCC)   SOB (shortness of breath)   CKD (chronic kidney disease)   Noncompliance w/medication treatment due to intermit use of medication  Time spent:   Irwin Brakeman, MD [facility] 03/11/2019, 12:44 PM    LOS: 1 day  How to contact the [facility] Attending or Consulting provider 7A - 7P or covering provider during after hours [place], for this patient?  1. Check the care team in [facility] and look for a) attending/consulting [facility] provider listed and b) the [facility] team listed 2. Log into [email or web address] and use [facility]'s universal password to access. If you do not have the password, please contact the hospital operator. 3. Locate the [facility] provider you are looking for under [facility] and page to a number that you can be directly reached. 4. If you still have difficulty reaching the provider, please page the [facility] (Director on Call) for the Hospitalists listed on amion for assistance.

## 2019-03-11 NOTE — Progress Notes (Signed)
PHARMACY - PHYSICIAN COMMUNICATION CRITICAL VALUE ALERT - BLOOD CULTURE IDENTIFICATION (BCID)  Richard Cox is an 38 y.o. male who presented to [facility] on 03/09/2019 with a chief complaint of hypertension and SOB  Assessment: presents to the hospital reporting worsening shortness of breath and chest tightness, particularly within the last few days, His chest x-ray suggestive of pneumonia and he had a fever. Only 1 bottle + and is Staph Spec Mec A not detected, likely contaminant.  Name of physician (or Provider) Contacted: Dr. Wynetta Emery  Current antibiotics: Augmentin 875mg  po BID  Changes to prescribed antibiotics recommended:  Continue current regimen and f/u clinical progress and cultures  Results for orders placed or performed during the hospital encounter of 03/09/19  Blood Culture ID Panel (Reflexed) (Collected: 03/10/2019  1:25 PM)  Result Value Ref Range   Enterococcus species NOT DETECTED NOT DETECTED   Listeria monocytogenes NOT DETECTED NOT DETECTED   Staphylococcus species DETECTED (A) NOT DETECTED   Staphylococcus aureus (BCID) NOT DETECTED NOT DETECTED   Methicillin resistance NOT DETECTED NOT DETECTED   Streptococcus species NOT DETECTED NOT DETECTED   Streptococcus agalactiae NOT DETECTED NOT DETECTED   Streptococcus pneumoniae NOT DETECTED NOT DETECTED   Streptococcus pyogenes NOT DETECTED NOT DETECTED   Acinetobacter baumannii NOT DETECTED NOT DETECTED   Enterobacteriaceae species NOT DETECTED NOT DETECTED   Enterobacter cloacae complex NOT DETECTED NOT DETECTED   Escherichia coli NOT DETECTED NOT DETECTED   Klebsiella oxytoca NOT DETECTED NOT DETECTED   Klebsiella pneumoniae NOT DETECTED NOT DETECTED   Proteus species NOT DETECTED NOT DETECTED   Serratia marcescens NOT DETECTED NOT DETECTED   Haemophilus influenzae NOT DETECTED NOT DETECTED   Neisseria meningitidis NOT DETECTED NOT DETECTED   Pseudomonas aeruginosa NOT DETECTED NOT DETECTED   Candida albicans NOT  DETECTED NOT DETECTED   Candida glabrata NOT DETECTED NOT DETECTED   Candida krusei NOT DETECTED NOT DETECTED   Candida parapsilosis NOT DETECTED NOT DETECTED   Candida tropicalis NOT DETECTED NOT DETECTED    Richard Cox, BS [place], BCPS Clinical Pharmacist Pager [phone] 03/11/2019  1:36 PM

## 2019-03-12 LAB — BASIC METABOLIC PANEL
Anion gap: 7 (ref 5–15)
BUN: 28 mg/dL — ABNORMAL HIGH (ref 6–20)
CO2: 29 mmol/L (ref 22–32)
Calcium: 8.5 mg/dL — ABNORMAL LOW (ref 8.9–10.3)
Chloride: 103 mmol/L (ref 98–111)
Creatinine, Ser: 1.34 mg/dL — ABNORMAL HIGH (ref 0.61–1.24)
GFR calc Af Amer: >60 mL/min (ref >60)
GFR calc non Af Amer: >60 mL/min (ref >60)
Glucose, Bld: 92 mg/dL (ref 70–99)
Potassium: 3.6 mmol/L (ref 3.5–5.1)
Sodium: 139 mmol/L (ref 135–145)

## 2019-03-12 MED ORDER — HYDRALAZINE HCL 20 MG/ML IJ SOLN: 10.0000 mg | INTRAMUSCULAR | Status: DC | PRN

## 2019-03-12 MED ORDER — FUROSEMIDE 40 MG PO TABS: 40.0000 mg | ORAL_TABLET | Freq: Every day | ORAL | Status: DC

## 2019-03-12 MED ORDER — FERROUS SULFATE 325 (65 FE) MG PO TABS
325.0000 mg | ORAL_TABLET | Freq: Every day | ORAL | Status: DC
  Administered 2019-03-12 – 2019-03-13 (×2): 325 mg via ORAL
  Filled 2019-03-12 (×2): qty 1

## 2019-03-12 NOTE — Progress Notes (Signed)
PROGRESS NOTE    Richard Cox  NWG:[id number]  DOB: 25-Oct-1981  DOA: 03/09/2019 PCP: Patient, No Pcp Per   Brief Admission Hx: 38 year old gentleman with a history of difficult to control hypertension but unfortunately has been untreated for the last 4 years presented to the emergency department with acute onset of shortness of breath.  He was diagnosed with a hypertensive urgency with acute congestive heart failure exacerbation.  MDM/Assessment & Plan:   1. Acute systolic CHF exacerbation-the patient has diuresed extremely well on IV Lasix.  He is down nearly 7 L since admission.  His creatinine is starting to bump today and I am going to hold Lasix for 2 days to give his kidneys arrest.  His edema has resolved especially in the lower extremities.  His 24-hour urine is in process.  He likely is going to require outpatient follow-up with the advanced heart failure team.  His echocardiogram suggests reduced systolic function with an EF of 40-45%.  The cardiology team is seen him and considering the differential diagnoses to include hypertensive cardiomyopathy versus hypertrophic cardiomyopathy with the possibility of an infiltrative process.  I like for cardiology to see him again tomorrow as patient is hopeful to discharge home soon. 2. Hypertensive urgency-resolved now with treatments.  He has been started on carvedilol, amlodipine, Imdur, hydralazine and we have been titrating doses for better blood pressure control.  His blood pressure is slowly improving with treatments.  The case management team has been working to help him with obtaining medications when discharged. 3. CAP- his chest x-ray is suggestive of pneumonia.  He has been started on Augmentin and his fever has defervesced.  I would like for him to complete a 5-day course of Augmentin. 4. Iron deficiency anemia- he has been started on ferrous sulfate. 5. Stage II CKD- likely secondary to longstanding poorly controlled hypertension.   Obtain renal ultrasound.  DVT prophylaxis: Lovenox Code Status: Full Family Communication: Patient prefers to update his parents himself. Disposition Plan: Home in 1 to 2 days   Consultants:  Inpatient cardiology  Procedures:  Echocardiogram IMPRESSIONS 1. The left ventricle has mild-moderately reduced systolic function, with an ejection fraction of 40-45%. The cavity size was normal. There is severely increased left ventricular wall thickness. Left ventricular diastolic Doppler parameters are  consistent with pseudonormalization. Left ventricular diffuse hypokinesis. Although possibly consistent with hypertensive cardiomyopathy in light of reported noncompliance with medical therapy and uncontrolled hypertension, the degree of LVH and  associated atrial enlargement also suggest a hypertrophic cardiomyopathy and the possibility of an infiltrative process. 2. The right ventricle has normal systolic function. The cavity was normal. There is moderately increased right ventricular wall thickness. Right ventricular systolic pressure normal with an estimated pressure of 33.7 mmHg. 3. Left atrial size was severely dilated. 4. Right atrial size was moderately dilated. 5. Small to moderate pericardial effusion. Mitral outflow pattern does not suggest tamponade physiology. 6. The pericardial effusion is circumferential. 7. The tricuspid valve is grossly normal. 8. The aortic valve is tricuspid. 9. The aortic root is normal in size and structure. 10. The inferior vena cava was dilated in size with >50% respiratory variability. 11. The mitral valve is grossly normal.  Antimicrobials:  Augmentin 5/2 >  Subjective: The patient reports that he occasionally feels chest discomfort described as a pressure in the center of the chest but it is brief and occurs at rest.  Overall he has improved considerably.  The edema in the lower extremities is nearly completely  resolved.  Objective: Vitals:   03/11/19 1423 03/11/19 2223 03/12/19 0555 03/12/19 0611  BP: (!) 144/105 (!) 138/97 (!) 153/98   Pulse: 95 87 85   Resp:  18 19   Temp:  98.9 F (37.2 C) 98.7 F (37.1 C)   TempSrc:  Oral Oral   SpO2: 98% 100% 97%   Weight:    71.1 kg  Height:        Intake/Output Summary (Last 24 hours) at 03/12/2019 1157 Last data filed at 03/12/2019 0630 Gross per 24 hour  Intake 600 ml  Output 5 ml  Net 595 ml   Filed Weights   03/09/19 1924 03/10/19 0229 03/12/19 0611  Weight: 79.4 kg 72.8 kg 71.1 kg     REVIEW OF SYSTEMS  As per history otherwise all reviewed and reported negative  Exam:  General exam: Thin young male lying in bed he is awake and alert in no apparent distress. Respiratory system: Clear. No increased work of breathing. Cardiovascular system: S1 & S2 heard. No JVD, murmurs, gallops, clicks or pedal edema. Gastrointestinal system: Abdomen is nondistended, soft and nontender. Normal bowel sounds heard. Central nervous system: Alert and oriented. No focal neurological deficits. Extremities: no CCE.  Data Reviewed: Basic Metabolic Panel: Recent Labs  Lab 03/09/19 2208 03/11/19 0409 03/12/19 0609  NA 137 136 139  K 4.0 3.5 3.6  CL 106 103 103  CO2 23 25 29   GLUCOSE 87 94 92  BUN 26* 23* 28*  CREATININE 1.62* 1.24 1.34*  CALCIUM 8.0* 8.0* 8.5*   Liver Function Tests: Recent Labs  Lab 03/10/19 1328  AST 30  ALT 28  ALKPHOS 93  BILITOT 0.6  PROT 7.5  ALBUMIN 2.4*   No results for input(s): LIPASE, AMYLASE in the last 168 hours. No results for input(s): AMMONIA in the last 168 hours. CBC: Recent Labs  Lab 03/09/19 2208 03/10/19 0301 03/11/19 0409  WBC 4.3 5.5 5.0  NEUTROABS 2.5 3.3 2.7  HGB 11.0* 9.6* 10.2*  HCT 35.0* 30.5* 32.4*  MCV 89.3 88.2 87.6  PLT 155 172 186   Cardiac Enzymes: Recent Labs  Lab 03/09/19 2031 03/10/19 0301 03/10/19 0908 03/10/19 1328  TROPONINI 0.13* 0.14* 0.17* 0.18*   CBG  (last 3)  Recent Labs    03/09/19 2156  GLUCAP 91   Recent Results (from the past 240 hour(s))  SARS Coronavirus 2 [facility] order, Performed in [place] hospital lab)     Status: None   Collection Time: 03/09/19  8:33 PM  Result Value Ref Range Status   SARS Coronavirus 2 NEGATIVE NEGATIVE Final    Comment: (NOTE) If result is NEGATIVE SARS-CoV-2 target nucleic acids are NOT DETECTED. The SARS-CoV-2 RNA is generally detectable in upper and lower  respiratory specimens during the acute phase of infection. The lowest  concentration of SARS-CoV-2 viral copies this assay can detect is 250  copies / mL. A negative result does not preclude SARS-CoV-2 infection  and should not be used as the sole basis for treatment or other  patient management decisions.  A negative result may occur with  improper specimen collection / handling, submission of specimen other  than nasopharyngeal swab, presence of viral mutation(s) within the  areas targeted by this assay, and inadequate number of viral copies  (<250 copies / mL). A negative result must be combined with clinical  observations, patient history, and epidemiological information. If result is POSITIVE SARS-CoV-2 target nucleic acids are DETECTED. The SARS-CoV-2 RNA is generally detectable in upper  and lower  respiratory specimens dur ing the acute phase of infection.  Positive  results are indicative of active infection with SARS-CoV-2.  Clinical  correlation with patient history and other diagnostic information is  necessary to determine patient infection status.  Positive results do  not rule out bacterial infection or co-infection with other viruses. If result is PRESUMPTIVE POSTIVE SARS-CoV-2 nucleic acids MAY BE PRESENT.   A presumptive positive result was obtained on the submitted specimen  and confirmed on repeat testing.  While 2019 novel coronavirus  (SARS-CoV-2) nucleic acids may be present in the submitted sample  additional  confirmatory testing may be necessary for epidemiological  and / or clinical management purposes  to differentiate between  SARS-CoV-2 and other Sarbecovirus currently known to infect humans.  If clinically indicated additional testing with an alternate test  methodology [phone]) is advised. The SARS-CoV-2 RNA is generally  detectable in upper and lower respiratory sp ecimens during the acute  phase of infection. The expected result is Negative. Fact Sheet for Patients:  StrictlyIdeas.no Fact Sheet for Healthcare Providers: BankingDealers.co.za This test is not yet approved or cleared by the [place] FDA and has been authorized for detection and/or diagnosis of SARS-CoV-2 by FDA under an Emergency Use Authorization (EUA).  This EUA will remain in effect (meaning this test can be used) for the duration of the COVID-19 declaration under Section 564(b)(1) of the Act, 21 U.S.C. section 360bbb-3(b)(1), unless the authorization is terminated or revoked sooner. Performed at [facility], [address] [place], [place] [address]   MRSA PCR Screening     Status: None   Collection Time: 03/10/19  2:28 AM  Result Value Ref Range Status   MRSA by PCR NEGATIVE NEGATIVE Final    Comment:        The GeneXpert MRSA Assay (FDA approved for NASAL specimens only), is one component of a comprehensive MRSA colonization surveillance program. It is not intended to diagnose MRSA infection nor to guide or monitor treatment for MRSA infections. Performed at [facility], [address], [place], [place] [address]   Culture, blood (Routine X 2) w Reflex to ID Panel     Status: None (Preliminary result)   Collection Time: 03/10/19  1:24 PM  Result Value Ref Range Status   Specimen Description BLOOD RIGHT HAND  Final   Special Requests   Final    BOTTLES DRAWN AEROBIC AND ANAEROBIC Blood Culture adequate volume   Culture   Final    NO GROWTH 2  DAYS Performed at [facility], [address], [place], [place] [address]    Report Status PENDING  Incomplete  Culture, blood (Routine X 2) w Reflex to ID Panel     Status: Abnormal (Preliminary result)   Collection Time: 03/10/19  1:25 PM  Result Value Ref Range Status   Specimen Description   Final    BLOOD LEFT HAND Performed at [facility], [address] [place], [place] [address]    Special Requests   Final    BOTTLES DRAWN AEROBIC AND ANAEROBIC Blood Culture adequate volume Performed at [facility], [address] [place], [place] [address]    Culture  Setup Time   Final    GRAM POSITIVE COCCI ANAEROBIC Gram Stain Report Called to,Read Back By and Verified With: DANIELS J. @ 0711 ON [id number] BY HENERSON L CRITICAL RESULT CALLED TO, READ BACK BY AND VERIFIED WITH: PHARMD L POOLE [id number] AT 1327 BY CM    Culture (A)  Final    STAPHYLOCOCCUS SPECIES (COAGULASE NEGATIVE) THE SIGNIFICANCE OF ISOLATING THIS ORGANISM FROM A SINGLE SET OF BLOOD CULTURES WHEN MULTIPLE SETS ARE DRAWN IS UNCERTAIN. PLEASE NOTIFY THE MICROBIOLOGY DEPARTMENT WITHIN ONE WEEK IF SPECIATION AND SENSITIVITIES ARE REQUIRED. Performed at [facility], [place] [address], [place], [place] [address]    Report Status PENDING  Incomplete  Blood Culture ID Panel (Reflexed)     Status: Abnormal   Collection Time: 03/10/19  1:25 PM  Result Value Ref Range Status   Enterococcus species NOT DETECTED NOT DETECTED Final   Listeria monocytogenes NOT DETECTED NOT DETECTED Final   Staphylococcus species DETECTED (A) NOT DETECTED Final    Comment: Methicillin (oxacillin) susceptible coagulase negative staphylococcus. Possible blood culture contaminant (unless isolated from more than one blood culture draw or clinical case suggests pathogenicity). No antibiotic treatment is indicated for blood  culture contaminants. CRITICAL RESULT CALLED TO, READ BACK BY AND VERIFIED WITH: PHARMD L POOLE [id number] AT 4332 BY CM     Staphylococcus aureus (BCID) NOT DETECTED NOT DETECTED Final   Methicillin resistance NOT DETECTED NOT DETECTED Final   Streptococcus species NOT DETECTED NOT DETECTED Final   Streptococcus agalactiae NOT DETECTED NOT DETECTED Final   Streptococcus pneumoniae NOT DETECTED NOT DETECTED Final   Streptococcus pyogenes NOT DETECTED NOT DETECTED Final   Acinetobacter baumannii NOT DETECTED NOT DETECTED Final   Enterobacteriaceae species NOT DETECTED NOT DETECTED Final   Enterobacter cloacae complex NOT DETECTED NOT DETECTED Final   Escherichia coli NOT DETECTED NOT DETECTED Final   Klebsiella oxytoca NOT DETECTED NOT DETECTED Final   Klebsiella pneumoniae NOT DETECTED NOT DETECTED Final   Proteus species NOT DETECTED NOT DETECTED Final   Serratia marcescens NOT DETECTED NOT DETECTED Final   Haemophilus influenzae NOT DETECTED NOT DETECTED Final   Neisseria meningitidis NOT DETECTED NOT DETECTED Final   Pseudomonas aeruginosa NOT DETECTED NOT DETECTED Final   Candida albicans NOT DETECTED NOT DETECTED Final   Candida glabrata NOT DETECTED NOT DETECTED Final   Candida krusei NOT DETECTED NOT DETECTED Final   Candida parapsilosis NOT DETECTED NOT DETECTED Final   Candida tropicalis NOT DETECTED NOT DETECTED Final    Comment: Performed at [facility], [address] [place], [place] [address]     Studies: Dg Chest Port 1 View  Result Date: 03/10/2019 CLINICAL DATA:  Chest pain and shortness of breath. Lower extremity swelling. Fever and cough. EXAM: PORTABLE CHEST 1 VIEW COMPARISON:  03/09/2019 FINDINGS: There is persistent cardiomegaly. Pulmonary vascularity is normal. There is a hazy infiltrate in the left midzone, essentially unchanged. There is prominent peribronchial thickening on the right, unchanged. No effusions. IMPRESSION: Persistent hazy infiltrate in the left midzone. Persistent bronchitic changes. Persistent cardiomegaly. Electronically Signed   By: Lorriane Shire M.D.   On:  03/10/2019 12:50     Scheduled Meds:  amLODipine  10 mg Oral Daily   amoxicillin-clavulanate  1 tablet Oral Q12H   carvedilol  12.5 mg Oral BID WC   enoxaparin (LOVENOX) injection  40 mg Subcutaneous Q24H   ferrous sulfate  325 mg Oral Q breakfast   [START ON 03/14/2019] furosemide  40 mg Oral Daily   hydrALAZINE  25 mg Oral Q8H   isosorbide mononitrate  30 mg Oral Daily   sodium chloride flush  3 mL Intravenous Q12H   Continuous Infusions:  sodium chloride      Principal Problem:   Acute exacerbation of CHF (congestive heart failure) (HCC) Active  Problems:   Hypertensive urgency   Congestive heart failure (HCC)   SOB (shortness of breath)   CKD (chronic kidney disease)   Noncompliance w/medication treatment due to intermit use of medication   Time spent:   Irwin Brakeman, MD [facility] 03/12/2019, 11:57 AM    LOS: 2 days  How to contact the [facility] Attending or Consulting provider [place] or covering provider during after hours [place], for this patient?  1. Check the care team in [facility] and look for a) attending/consulting [facility] provider listed and b) the [facility] team listed 2. Log into [email or web address] and use [place]'s universal password to access. If you do not have the password, please contact the hospital operator. 3. Locate the [facility] provider you are looking for under [facility] and page to a number that you can be directly reached. 4. If you still have difficulty reaching the provider, please page the [facility] (Director on Call) for the Hospitalists listed on amion for assistance.

## 2019-03-13 ENCOUNTER — Encounter (HOSPITAL_COMMUNITY): Payer: Self-pay | Admitting: Family Medicine

## 2019-03-13 DIAGNOSIS — R75 Inconclusive laboratory evidence of human immunodeficiency virus [HIV]: Secondary | ICD-10-CM | POA: Diagnosis present

## 2019-03-13 DIAGNOSIS — I509 Heart failure, unspecified: Secondary | ICD-10-CM

## 2019-03-13 LAB — CBC
HCT: 35.7 % — ABNORMAL LOW (ref 39.0–52.0)
Hemoglobin: 11 g/dL — ABNORMAL LOW (ref 13.0–17.0)
MCH: 27.5 pg (ref 26.0–34.0)
MCHC: 30.8 g/dL (ref 30.0–36.0)
MCV: 89.3 fL (ref 80.0–100.0)
Platelets: 230 10*3/uL (ref 150–400)
RBC: 4 MIL/uL — ABNORMAL LOW (ref 4.22–5.81)
RDW: 15.8 % — ABNORMAL HIGH (ref 11.5–15.5)
WBC: 4.3 10*3/uL (ref 4.0–10.5)
nRBC: 0 % (ref 0.0–0.2)

## 2019-03-13 LAB — RENAL FUNCTION PANEL
Albumin: 2.4 g/dL — ABNORMAL LOW (ref 3.5–5.0)
Anion gap: 9 (ref 5–15)
BUN: 28 mg/dL — ABNORMAL HIGH (ref 6–20)
CO2: 26 mmol/L (ref 22–32)
Calcium: 8.3 mg/dL — ABNORMAL LOW (ref 8.9–10.3)
Chloride: 103 mmol/L (ref 98–111)
Creatinine, Ser: 1.35 mg/dL — ABNORMAL HIGH (ref 0.61–1.24)
GFR calc Af Amer: >60 mL/min (ref >60)
GFR calc non Af Amer: >60 mL/min (ref >60)
Glucose, Bld: 90 mg/dL (ref 70–99)
Phosphorus: 3.8 mg/dL (ref 2.5–4.6)
Potassium: 3.8 mmol/L (ref 3.5–5.1)
Sodium: 138 mmol/L (ref 135–145)

## 2019-03-13 LAB — HIV 1/2 AB DIFFERENTIATION
HIV 1 Ab: POSITIVE — AB
HIV 2 Ab: UNDETERMINED

## 2019-03-13 LAB — PROTEIN ELECTROPHORESIS, SERUM
A/G Ratio: 0.5 — ABNORMAL LOW (ref 0.7–1.7)
Albumin ELP: 2.4 g/dL — ABNORMAL LOW (ref 2.9–4.4)
Alpha-1-Globulin: 0.2 g/dL (ref 0.0–0.4)
Alpha-2-Globulin: 0.8 g/dL (ref 0.4–1.0)
Beta Globulin: 0.9 g/dL (ref 0.7–1.3)
Gamma Globulin: 2.9 g/dL — ABNORMAL HIGH (ref 0.4–1.8)
Globulin, Total: 4.8 g/dL — ABNORMAL HIGH (ref 2.2–3.9)
Total Protein ELP: 7.2 g/dL (ref 6.0–8.5)

## 2019-03-13 LAB — HIV ANTIBODY (ROUTINE TESTING W REFLEX): HIV Screen 4th Generation wRfx: REACTIVE — AB

## 2019-03-13 LAB — CULTURE, BLOOD (ROUTINE X 2): Special Requests: ADEQUATE

## 2019-03-13 LAB — VITAMIN D 25 HYDROXY (VIT D DEFICIENCY, FRACTURES): Vit D, 25-Hydroxy: 10.1 ng/mL — ABNORMAL LOW (ref 30.0–100.0)

## 2019-03-13 MED ORDER — POTASSIUM CHLORIDE ER 10 MEQ PO TBCR: 10.0000 meq | EXTENDED_RELEASE_TABLET | Freq: Every day | ORAL | 0 refills | Status: DC

## 2019-03-13 MED ORDER — FUROSEMIDE 40 MG PO TABS: 40.0000 mg | ORAL_TABLET | Freq: Every day | ORAL | 0 refills | Status: DC

## 2019-03-13 MED ORDER — VITAMIN D (ERGOCALCIFEROL) 1.25 MG (50000 UNIT) PO CAPS
50000.0000 [IU] | ORAL_CAPSULE | ORAL | Status: DC
  Administered 2019-03-13: 50000 [IU] via ORAL
  Filled 2019-03-13: qty 1

## 2019-03-13 MED ORDER — CARVEDILOL 25 MG PO TABS: 25.0000 mg | ORAL_TABLET | Freq: Two times a day (BID) | ORAL | 0 refills | Status: DC

## 2019-03-13 MED ORDER — FERROUS SULFATE 325 (65 FE) MG PO TABS: 325.0000 mg | ORAL_TABLET | Freq: Every day | ORAL | 0 refills | Status: DC

## 2019-03-13 MED ORDER — DOCUSATE SODIUM 100 MG PO CAPS: 100.0000 mg | ORAL_CAPSULE | Freq: Every day | ORAL | 0 refills | Status: AC

## 2019-03-13 MED ORDER — ISOSORBIDE MONONITRATE ER 30 MG PO TB24: 30.0000 mg | ORAL_TABLET | Freq: Every day | ORAL | 0 refills | Status: DC

## 2019-03-13 MED ORDER — VITAMIN D (ERGOCALCIFEROL) 1.25 MG (50000 UNIT) PO CAPS: 50000.0000 [IU] | ORAL_CAPSULE | ORAL | 0 refills | Status: AC

## 2019-03-13 MED ORDER — HYDRALAZINE HCL 25 MG PO TABS: 25.0000 mg | ORAL_TABLET | Freq: Three times a day (TID) | ORAL | 0 refills | Status: DC

## 2019-03-13 NOTE — Progress Notes (Signed)
PROGRESS NOTE    Richard Cox  MLY:[id number]  DOB: March 09, 1981  DOA: 03/09/2019 PCP: Patient, No Pcp Per   Brief Admission Hx: 38 year old gentleman with poorly controlled hypertension presented with acute onset shortness of breath.  Was diagnosed with a hypertensive urgency with acute congestive heart failure exacerbation.  MDM/Assessment & Plan:   1. Acute systolic CHF exacerbation- the patient has diuresed 7.8 L since admission.  He is feeling much better.  His edematous changes have resolved.  He has been taken off IV Lasix due to bump in creatinine and should start oral Lasix 03/14/2019.  His 24-hour urine protein was 63.  He will need outpatient follow-up with the heart failure team.  His echocardiogram suggests reduced systolic function with an EF of 40-45%. 2. Hypertensive urgency-resolved now with treatments.  He is on carvedilol, amlodipine, Imdur, hydralazine and his blood pressure is much better.  Case management is working with him to assist with obtaining medications.  These medications should be covered under the generic [employer] program. 3. Community-acquired pneumonia- chest x-ray was suggestive of pneumonia.  He was febrile.  He has been started on Augmentin.  He is feeling better.  Complete Augmentin after 5-day full course. 4. Iron deficiency anemia-he has been started on ferrous sulfate 325 mg daily with breakfast. 5. Severe vitamin D deficiency- will start Drisdol 50,000 IU caps. 6. 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  Renal ultrasound pending.  DVT prophylaxis: Lovenox Code Status: Full Family Communication: Patient prefers to update his parents himself. Disposition Plan: Home possibly later today if okay with cardiology team   Consultants:  Inpatient cardiology  Procedures:  Echocardiogram IMPRESSIONS 1. The left ventricle has mild-moderately reduced systolic function, with an ejection fraction of 40-45%. The cavity size was  normal. There is severely increased left ventricular wall thickness. Left ventricular diastolic Doppler parameters are  consistent with pseudonormalization. Left ventricular diffuse hypokinesis. Although possibly consistent with hypertensive cardiomyopathy in light of reported noncompliance with medical therapy and uncontrolled hypertension, the degree of LVH and  associated atrial enlargement also suggest a hypertrophic cardiomyopathy and the possibility of an infiltrative process. 2. The right ventricle has normal systolic function. The cavity was normal. There is moderately increased right ventricular wall thickness. Right ventricular systolic pressure normal with an estimated pressure of 33.7 mmHg. 3. Left atrial size was severely dilated. 4. Right atrial size was moderately dilated. 5. Small to moderate pericardial effusion. Mitral outflow pattern does not suggest tamponade physiology. 6. The pericardial effusion is circumferential. 7. The tricuspid valve is grossly normal. 8. The aortic valve is tricuspid. 9. The aortic root is normal in size and structure. 10. The inferior vena cava was dilated in size with >50% respiratory variability. 11. The mitral valve is grossly normal.   Antimicrobials:  Augmentin 5/2>  Subjective: The patient says he feels much better.  He is not having the chest discomfort that he had earlier.  He denies shortness of breath.  He is urinating well.  He is having bowel movements..  Objective: Vitals:   03/12/19 2025 03/12/19 2114 03/13/19 0513 03/13/19 0856  BP:  137/74 (!) 145/99 (!) 146/90  Pulse:  82 83   Resp:  20 20   Temp:  99.4 F (37.4 C) 98.6 F (37 C)   TempSrc:  Oral Oral   SpO2: 97% 98% 98%   Weight:   71.3 kg   Height:        Intake/Output Summary (Last 24 hours) at 03/13/2019 1218  Last data filed at 03/13/2019 0900 Gross per 24 hour  Intake 1320 ml  Output 1850 ml  Net -530 ml   Filed Weights   03/10/19 0229 03/12/19 0611  03/13/19 0513  Weight: 72.8 kg 71.1 kg 71.3 kg   REVIEW OF SYSTEMS  As per history otherwise all reviewed and reported negative  Exam:  General exam: Thin young male lying in bed he is awake and alert in no apparent distress. Respiratory system: Clear. No increased work of breathing. Cardiovascular system: S1 & S2 heard. No JVD, murmurs, gallops, clicks or pedal edema. Gastrointestinal system: Abdomen is nondistended, soft and nontender. Normal bowel sounds heard. Central nervous system: Alert and oriented. No focal neurological deficits. Extremities: no CCE.   Data Reviewed: Basic Metabolic Panel: Recent Labs  Lab 03/09/19 2208 03/11/19 0409 03/12/19 0609 03/13/19 0530  NA 137 136 139 138  K 4.0 3.5 3.6 3.8  CL 106 103 103 103  CO2 23 25 29 26   GLUCOSE 87 94 92 90  BUN 26* 23* 28* 28*  CREATININE 1.62* 1.24 1.34* 1.35*  CALCIUM 8.0* 8.0* 8.5* 8.3*  PHOS  --   --   --  3.8   Liver Function Tests: Recent Labs  Lab 03/10/19 1328 03/13/19 0530  AST 30  --   ALT 28  --   ALKPHOS 93  --   BILITOT 0.6  --   PROT 7.5  --   ALBUMIN 2.4* 2.4*   No results for input(s): LIPASE, AMYLASE in the last 168 hours. No results for input(s): AMMONIA in the last 168 hours. CBC: Recent Labs  Lab 03/09/19 2208 03/10/19 0301 03/11/19 0409 03/13/19 0530  WBC 4.3 5.5 5.0 4.3  NEUTROABS 2.5 3.3 2.7  --   HGB 11.0* 9.6* 10.2* 11.0*  HCT 35.0* 30.5* 32.4* 35.7*  MCV 89.3 88.2 87.6 89.3  PLT 155 172 186 230   Cardiac Enzymes: Recent Labs  Lab 03/09/19 2031 03/10/19 0301 03/10/19 0908 03/10/19 1328  TROPONINI 0.13* 0.14* 0.17* 0.18*   CBG (last 3)  No results for input(s): GLUCAP in the last 72 hours. Recent Results (from the past 240 hour(s))  SARS Coronavirus 2 [facility] order, Performed in [place] hospital lab)     Status: None   Collection Time: 03/09/19  8:33 PM  Result Value Ref Range Status   SARS Coronavirus 2 NEGATIVE NEGATIVE Final    Comment: (NOTE) If  result is NEGATIVE SARS-CoV-2 target nucleic acids are NOT DETECTED. The SARS-CoV-2 RNA is generally detectable in upper and lower  respiratory specimens during the acute phase of infection. The lowest  concentration of SARS-CoV-2 viral copies this assay can detect is 250  copies / mL. A negative result does not preclude SARS-CoV-2 infection  and should not be used as the sole basis for treatment or other  patient management decisions.  A negative result may occur with  improper specimen collection / handling, submission of specimen other  than nasopharyngeal swab, presence of viral mutation(s) within the  areas targeted by this assay, and inadequate number of viral copies  (<250 copies / mL). A negative result must be combined with clinical  observations, patient history, and epidemiological information. If result is POSITIVE SARS-CoV-2 target nucleic acids are DETECTED. The SARS-CoV-2 RNA is generally detectable in upper and lower  respiratory specimens dur ing the acute phase of infection.  Positive  results are indicative of active infection with SARS-CoV-2.  Clinical  correlation with patient history and other diagnostic  information is  necessary to determine patient infection status.  Positive results do  not rule out bacterial infection or co-infection with other viruses. If result is PRESUMPTIVE POSTIVE SARS-CoV-2 nucleic acids MAY BE PRESENT.   A presumptive positive result was obtained on the submitted specimen  and confirmed on repeat testing.  While 2019 novel coronavirus  (SARS-CoV-2) nucleic acids may be present in the submitted sample  additional confirmatory testing may be necessary for epidemiological  and / or clinical management purposes  to differentiate between  SARS-CoV-2 and other Sarbecovirus currently known to infect humans.  If clinically indicated additional testing with an alternate test  methodology [phone]) is advised. The SARS-CoV-2 RNA is generally   detectable in upper and lower respiratory sp ecimens during the acute  phase of infection. The expected result is Negative. Fact Sheet for Patients:  StrictlyIdeas.no Fact Sheet for Healthcare Providers: BankingDealers.co.za This test is not yet approved or cleared by the [place] FDA and has been authorized for detection and/or diagnosis of SARS-CoV-2 by FDA under an Emergency Use Authorization (EUA).  This EUA will remain in effect (meaning this test can be used) for the duration of the COVID-19 declaration under Section 564(b)(1) of the Act, 21 U.S.C. section 360bbb-3(b)(1), unless the authorization is terminated or revoked sooner. Performed at [facility], [address] [place], [place] [address]   MRSA PCR Screening     Status: None   Collection Time: 03/10/19  2:28 AM  Result Value Ref Range Status   MRSA by PCR NEGATIVE NEGATIVE Final    Comment:        The GeneXpert MRSA Assay (FDA approved for NASAL specimens only), is one component of a comprehensive MRSA colonization surveillance program. It is not intended to diagnose MRSA infection nor to guide or monitor treatment for MRSA infections. Performed at [facility], [address], [place], [place] [address]   Culture, blood (Routine X 2) w Reflex to ID Panel     Status: None (Preliminary result)   Collection Time: 03/10/19  1:24 PM  Result Value Ref Range Status   Specimen Description BLOOD RIGHT HAND  Final   Special Requests   Final    BOTTLES DRAWN AEROBIC AND ANAEROBIC Blood Culture adequate volume   Culture   Final    NO GROWTH 3 DAYS Performed at [facility], [address], [place], [place] [address]    Report Status PENDING  Incomplete  Culture, blood (Routine X 2) w Reflex to ID Panel     Status: Abnormal   Collection Time: 03/10/19  1:25 PM  Result Value Ref Range Status   Specimen Description   Final    BLOOD LEFT HAND Performed at [facility], [address] [place], [place] [address]    Special Requests   Final    BOTTLES DRAWN AEROBIC AND ANAEROBIC Blood Culture adequate volume Performed at [facility], [address] [place], [place] [address]    Culture  Setup Time   Final    GRAM POSITIVE COCCI ANAEROBIC Gram Stain Report Called to,Read Back By and Verified With: DANIELS J. @ 0711 ON [id number] BY HENERSON L CRITICAL RESULT CALLED TO, READ BACK BY AND VERIFIED WITH: PHARMD L POOLE [id number] AT 1327 BY CM    Culture (A)  Final    STAPHYLOCOCCUS SPECIES (COAGULASE NEGATIVE) THE SIGNIFICANCE OF ISOLATING THIS ORGANISM FROM A SINGLE SET OF BLOOD CULTURES WHEN MULTIPLE SETS ARE DRAWN IS UNCERTAIN. PLEASE NOTIFY THE MICROBIOLOGY DEPARTMENT WITHIN  ONE WEEK IF SPECIATION AND SENSITIVITIES ARE REQUIRED. Performed at [facility], [place] [address], [place], [place] [address]    Report Status 03/13/2019 FINAL  Final  Blood Culture ID Panel (Reflexed)     Status: Abnormal   Collection Time: 03/10/19  1:25 PM  Result Value Ref Range Status   Enterococcus species NOT DETECTED NOT DETECTED Final   Listeria monocytogenes NOT DETECTED NOT DETECTED Final   Staphylococcus species DETECTED (A) NOT DETECTED Final    Comment: Methicillin (oxacillin) susceptible coagulase negative staphylococcus. Possible blood culture contaminant (unless isolated from more than one blood culture draw or clinical case suggests pathogenicity). No antibiotic treatment is indicated for blood  culture contaminants. CRITICAL RESULT CALLED TO, READ BACK BY AND VERIFIED WITH: PHARMD L POOLE [id number] AT 0737 BY CM    Staphylococcus aureus (BCID) NOT DETECTED NOT DETECTED Final   Methicillin resistance NOT DETECTED NOT DETECTED Final   Streptococcus species NOT DETECTED NOT DETECTED Final   Streptococcus agalactiae NOT DETECTED NOT DETECTED Final   Streptococcus pneumoniae NOT DETECTED NOT DETECTED Final   Streptococcus pyogenes NOT DETECTED NOT DETECTED Final    Acinetobacter baumannii NOT DETECTED NOT DETECTED Final   Enterobacteriaceae species NOT DETECTED NOT DETECTED Final   Enterobacter cloacae complex NOT DETECTED NOT DETECTED Final   Escherichia coli NOT DETECTED NOT DETECTED Final   Klebsiella oxytoca NOT DETECTED NOT DETECTED Final   Klebsiella pneumoniae NOT DETECTED NOT DETECTED Final   Proteus species NOT DETECTED NOT DETECTED Final   Serratia marcescens NOT DETECTED NOT DETECTED Final   Haemophilus influenzae NOT DETECTED NOT DETECTED Final   Neisseria meningitidis NOT DETECTED NOT DETECTED Final   Pseudomonas aeruginosa NOT DETECTED NOT DETECTED Final   Candida albicans NOT DETECTED NOT DETECTED Final   Candida glabrata NOT DETECTED NOT DETECTED Final   Candida krusei NOT DETECTED NOT DETECTED Final   Candida parapsilosis NOT DETECTED NOT DETECTED Final   Candida tropicalis NOT DETECTED NOT DETECTED Final    Comment: Performed at [facility], [address], [place], [place] [address]    Studies: No results found.  Scheduled Meds: . amLODipine  10 mg Oral Daily  . amoxicillin-clavulanate  1 tablet Oral Q12H  . carvedilol  12.5 mg Oral BID WC  . enoxaparin (LOVENOX) injection  40 mg Subcutaneous Q24H  . ferrous sulfate  325 mg Oral Q breakfast  . [START ON 03/14/2019] furosemide  40 mg Oral Daily  . hydrALAZINE  25 mg Oral Q8H  . isosorbide mononitrate  30 mg Oral Daily  . sodium chloride flush  3 mL Intravenous Q12H   Continuous Infusions: . sodium chloride      Principal Problem:   Acute exacerbation of CHF (congestive heart failure) (HCC) Active Problems:   Hypertensive urgency   Congestive heart failure (HCC)   SOB (shortness of breath)   CKD (chronic kidney disease)   Noncompliance w/medication treatment due to intermit use of medication  Time spent:   Irwin Brakeman, MD [facility] 03/13/2019, 12:18 PM    LOS: 3 days  How to contact the [facility] Attending or Consulting provider [place] or covering  provider during after hours [place], for this patient?  1. Check the care team in [facility] and look for a) attending/consulting [facility] provider listed and b) the [facility] team listed 2. Log into [email or web address] and use [place]'s universal password to access. If you do not have the password, please contact the hospital operator. 3.  Locate the [facility] provider you are looking for under [facility] and page to a number that you can be directly reached. 4. If you still have difficulty reaching the provider, please page the [facility] (Director on Call) for the Hospitalists listed on amion for assistance.

## 2019-03-13 NOTE — Discharge Instructions (Signed)

## 2019-03-13 NOTE — Discharge Summary (Addendum)
Physician Discharge Summary  Richard Cox VOZ:[id number] DOB: 07-17-1981 DOA: 03/09/2019  PCP: Patient to establish care at [facility]: Pt seen by Dr. Domenic Polite and Dr. Harrington Challenger in hospital  Admit date: 03/09/2019 Discharge date: 03/13/2019  Admitted From: Home  Disposition: Home   Recommendations for Outpatient Follow-up:  1. Follow up with PCP in 1 weeks 2. Follow up with cardiology in 2 weeks  3. Follow up with [facility] in 2 weeks for test results 4. Please obtain BMP/CBC in 1-2 weeks 5. Please follow up on the following pending results: infectious disease test results  Discharge Condition: STABLE   CODE STATUS: FULL    Brief Hospitalization Summary: Please see all hospital notes, images, labs for full details of the hospitalization. Dr. Toney Sang HPI:  Richard Cox  is a 38 y.o. male, with history of hypertension, noncompliant with medications.  Patient says that he has not taken medication for past 3 years since he moved from [place].  Patient says over the past few weeks he has noticed worsening shortness of breath, worse on laying down flat.  Also complained of left-sided chest pain only for a brief.  Of time.  Denies fever, chills, no cough. Denies headache, no blurred vision.  Denies dysuria.  Denies nausea vomiting or diarrhea.  In the ED, lab work revealed negative SARS coronavirus 2, BNP elevated to 2445.  Chest x-ray showed pulmonary edema, patient given 1 dose of Lasix 40 mg IV.  Also patient was hypertensive with BP 172/123.  Patient started on nitroglycerin gtt.  He denies previous history of MI. No history of stroke or seizures. Complains of orthopnea   Brief Admission Hx: 38 year old gentleman with poorly controlled hypertension presented with acute onset shortness of breath.  Was diagnosed with a hypertensive urgency with acute congestive heart failure exacerbation.  MDM/Assessment & Plan:   1. Acute systolic CHF exacerbation- the patient has  diuresed 7.8 L since admission.  He is feeling much better.  His edematous changes have resolved.  He has been taken off IV Lasix due to bump in creatinine and should start oral Lasix 40 mg daily  03/14/2019.  His 24-hour urine protein was 1452.  He will need outpatient follow-up with the heart failure team.  I made a referral.  His echocardiogram suggests reduced systolic function with an EF of 40-45% - see report below. 2. Hypertensive urgency-resolved now with treatments.  He will be discharged on carvedilol 25 mg BID, Imdur 30 mg, hydralazine 25 mg TID and his blood pressure is much better with these therapies.  Case management is working with him to assist with obtaining medications.  These medications should be covered under the generic [employer] program.  Pt has primary care follow up at the [facility] family Medicine clinic arranged.  3. Community-acquired pneumonia- chest x-ray was suggestive of pneumonia.  He was febrile.  He was treated with Augmentin.  He is feeling better.   4. Iron deficiency anemia-he has been started on ferrous sulfate 325 mg daily with breakfast. 5. Severe vitamin D deficiency- started Drisdol 50,000 IU caps twice per week. 6. 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  7. B20 - Pt had a positive screening test.  I counseled patient at bedside about confirmatory testing that has been ordered and the importance to follow up with the ID clinic for his test results.  He verbalized understanding.  The ID clinic is closed now and could not make him an appointment but I gave  him the number and address and instructions to call and make an appointment to get his test results and to start treatment if needed. He verbalized understanding.    DVT prophylaxis: Lovenox Code Status: Full Family Communication: Patient prefers to update his parents himself. Disposition Plan: Home   Consultants:  Inpatient  cardiology  Procedures:  Echocardiogram IMPRESSIONS 1. The left ventricle has mild-moderately reduced systolic function, with an ejection fraction of 40-45%. The cavity size was normal. There is severely increased left ventricular wall thickness. Left ventricular diastolic Doppler parameters are  consistent with pseudonormalization. Left ventricular diffuse hypokinesis. Although possibly consistent with hypertensive cardiomyopathy in light of reported noncompliance with medical therapy and uncontrolled hypertension, the degree of LVH and  associated atrial enlargement also suggest a hypertrophic cardiomyopathy and the possibility of an infiltrative process. 2. The right ventricle has normal systolic function. The cavity was normal. There is moderately increased right ventricular wall thickness. Right ventricular systolic pressure normal with an estimated pressure of 33.7 mmHg. 3. Left atrial size was severely dilated. 4. Right atrial size was moderately dilated. 5. Small to moderate pericardial effusion. Mitral outflow pattern does not suggest tamponade physiology. 6. The pericardial effusion is circumferential. 7. The tricuspid valve is grossly normal. 8. The aortic valve is tricuspid. 9. The aortic root is normal in size and structure. 10. The inferior vena cava was dilated in size with >50% respiratory variability. 11. The mitral valve is grossly normal.  Antimicrobials:  Augmentin 5/2>   Discharge Diagnoses:  Principal Problem:   Acute exacerbation of CHF (congestive heart failure) (HCC) Active Problems:   Hypertensive urgency   Congestive heart failure (HCC)   SOB (shortness of breath)   CKD (chronic kidney disease)   Noncompliance w/medication treatment due to intermit use of medication   Discharge Instructions: Discharge Instructions    (HEART FAILURE PATIENTS) Call MD:  Anytime you have any of the following symptoms: 1) 3 pound weight gain in 24 hours or 5  pounds in 1 week 2) shortness of breath, with or without a dry hacking cough 3) swelling in the hands, feet or stomach 4) if you have to sleep on extra pillows at night in order to breathe.   Complete by:  As directed    AMB referral to CHF clinic   Complete by:  As directed    Call MD for:  difficulty breathing, headache or visual disturbances   Complete by:  As directed    Call MD for:  extreme fatigue   Complete by:  As directed    Call MD for:  persistant dizziness or light-headedness   Complete by:  As directed    Diet - low sodium heart healthy   Complete by:  As directed    Increase activity slowly   Complete by:  As directed      Allergies as of 03/13/2019      Reactions   Neosporin [neomycin-bacitracin Zn-polymyx] Rash      Medication List    STOP taking these medications   ibuprofen 800 MG tablet Commonly known as:  ADVIL     TAKE these medications   acetaminophen 500 MG tablet Commonly known as:  TYLENOL Take 500-1,500 mg by mouth daily as needed for mild pain or moderate pain.   carvedilol 25 MG tablet Commonly known as:  COREG Take 1 tablet (25 mg total) by mouth 2 (two) times daily with a meal for 30 days. Start taking on:  Mar 14, 2019   docusate  sodium 100 MG capsule Commonly known as:  Colace Take 1 capsule (100 mg total) by mouth daily for 30 days.   ferrous sulfate 325 (65 FE) MG tablet Take 1 tablet (325 mg total) by mouth daily with breakfast for 30 days. Start taking on:  Mar 14, 2019   furosemide 40 MG tablet Commonly known as:  LASIX Take 1 tablet (40 mg total) by mouth daily for 30 days. Start taking on:  Mar 14, 2019   hydrALAZINE 25 MG tablet Commonly known as:  APRESOLINE Take 1 tablet (25 mg total) by mouth every 8 (eight) hours for 30 days.   isosorbide mononitrate 30 MG 24 hr tablet Commonly known as:  IMDUR Take 1 tablet (30 mg total) by mouth daily for 30 days. Start taking on:  Mar 14, 2019   potassium chloride 10 MEQ  tablet Commonly known as:  K-DUR Take 1 tablet (10 mEq total) by mouth daily for 30 days. Start taking on:  Mar 14, 2019   Vitamin D (Ergocalciferol) 1.25 MG (50000 UT) Caps capsule Commonly known as:  DRISDOL Take 1 capsule (50,000 Units total) by mouth 2 (two) times a week for 30 days. Start taking on:  Mar 16, 2019      Follow-up Information    [facility] Follow up on 04/19/2019.   Why:  Wednesday at 1:00PM with the Dr.  Deborha Payment will call you with a sooner appointment when one becomes available. [facility] 336 [phone] Contact information: [address] [place] [address] [phone]        [facility]. Schedule an appointment as soon as possible for a visit in 2 week(s).   Specialty:  Infectious Diseases Why:  Please call and make appointment for test results Contact information: [place], [place] [id number] [address] [place], [place], [place]. Schedule an appointment as soon as possible for a visit in 2 week(s).   Specialties:  Physician Assistant, Cardiology Why:  Hospital Follow Up Contact information: [place] [address] [phone]          Allergies  Allergen Reactions  . Neosporin [Neomycin-Bacitracin Zn-Polymyx] Rash   Allergies as of 03/13/2019      Reactions   Neosporin [neomycin-bacitracin Zn-polymyx] Rash      Medication List    STOP taking these medications   ibuprofen 800 MG tablet Commonly known as:  ADVIL     TAKE these medications   acetaminophen 500 MG tablet Commonly known as:  TYLENOL Take 500-1,500 mg by mouth daily as needed for mild pain or moderate pain.   carvedilol 25 MG tablet Commonly known as:  COREG Take 1 tablet (25 mg total) by mouth 2 (two) times daily with a meal for 30 days. Start taking on:  Mar 14, 2019   docusate sodium 100 MG capsule Commonly known as:  Colace Take 1  capsule (100 mg total) by mouth daily for 30 days.   ferrous sulfate 325 (65 FE) MG tablet Take 1 tablet (325 mg total) by mouth daily with breakfast for 30 days. Start taking on:  Mar 14, 2019   furosemide 40 MG tablet Commonly known as:  LASIX Take 1 tablet (40 mg total) by mouth daily for 30 days. Start taking on:  Mar 14, 2019   hydrALAZINE 25 MG tablet Commonly known as:  APRESOLINE Take 1 tablet (25  mg total) by mouth every 8 (eight) hours for 30 days.   isosorbide mononitrate 30 MG 24 hr tablet Commonly known as:  IMDUR Take 1 tablet (30 mg total) by mouth daily for 30 days. Start taking on:  Mar 14, 2019   potassium chloride 10 MEQ tablet Commonly known as:  K-DUR Take 1 tablet (10 mEq total) by mouth daily for 30 days. Start taking on:  Mar 14, 2019   Vitamin D (Ergocalciferol) 1.25 MG (50000 UT) Caps capsule Commonly known as:  DRISDOL Take 1 capsule (50,000 Units total) by mouth 2 (two) times a week for 30 days. Start taking on:  Mar 16, 2019       Procedures/Studies: Dg Chest Port 1 View  Result Date: 03/10/2019 CLINICAL DATA:  Chest pain and shortness of breath. Lower extremity swelling. Fever and cough. EXAM: PORTABLE CHEST 1 VIEW COMPARISON:  03/09/2019 FINDINGS: There is persistent cardiomegaly. Pulmonary vascularity is normal. There is a hazy infiltrate in the left midzone, essentially unchanged. There is prominent peribronchial thickening on the right, unchanged. No effusions. IMPRESSION: Persistent hazy infiltrate in the left midzone. Persistent bronchitic changes. Persistent cardiomegaly. Electronically Signed   By: Lorriane Shire M.D.   On: 03/10/2019 12:50   Dg Chest Port 1 View  Result Date: 03/09/2019 CLINICAL DATA:  Initial evaluation for acute shortness of breath, hypertension, lower extremity swelling. EXAM: PORTABLE CHEST 1 VIEW COMPARISON:  None available. FINDINGS: Cardiomegaly. Mediastinal silhouette within normal limits. Lungs normally inflated.  Diffuse pulmonary vascular congestion with interstitial prominence and scattered Kerley B-lines, compatible with pulmonary interstitial edema. Asymmetric hazy opacity overlying the left lung favored to be secondary to patient positioning as the patient is slightly rotated to the left, although superimposed infiltrate could be considered in the correct clinical setting. No appreciable pleural effusion. No pneumothorax. No acute osseous finding. IMPRESSION: 1. Cardiomegaly with mild diffuse pulmonary interstitial edema. 2. Asymmetric hazy opacity within the mid left lung, favored to be related to patient positioning and/or asymmetric edema. Superimposed infection would be difficult to exclude, and could be considered in the correct clinical setting. Electronically Signed   By: Jeannine Boga M.D.   On: 03/09/2019 20:00     Subjective: Pt is feeling much better and wanting to go home.  He says that he will be sure to follow up as we have advised and recommended.  He is no longer having chest discomfort.    Discharge Exam: Vitals:   03/13/19 1200 03/13/19 1502  BP: 133/81 (!) 134/93  Pulse:  83  Resp:  18  Temp:  98.2 F (36.8 C)  SpO2:  99%   Vitals:   03/13/19 0513 03/13/19 0856 03/13/19 1200 03/13/19 1502  BP: (!) 145/99 (!) 146/90 133/81 (!) 134/93  Pulse: 83   83  Resp: 20   18  Temp: 98.6 F (37 C)   98.2 F (36.8 C)  TempSrc: Oral   Oral  SpO2: 98%   99%  Weight: 71.3 kg     Height:       General exam: Thin young male lying in bed he is awake and alert in no apparent distress. Respiratory system: Clear. No increased work of breathing. Cardiovascular system: S1 & S2 heard. No JVD, murmurs, gallops, clicks or pedal edema. Gastrointestinal system: Abdomen is nondistended, soft and nontender. Normal bowel sounds heard. Central nervous system: Alert and oriented. No focal neurological deficits. Extremities: no CCE.   The results of significant diagnostics from this  hospitalization (including imaging,  microbiology, ancillary and laboratory) are listed below for reference.     Microbiology: Recent Results (from the past 240 hour(s))  SARS Coronavirus 2 [facility] order, Performed in [facility] hospital lab)     Status: None   Collection Time: 03/09/19  8:33 PM  Result Value Ref Range Status   SARS Coronavirus 2 NEGATIVE NEGATIVE Final    Comment: (NOTE) If result is NEGATIVE SARS-CoV-2 target nucleic acids are NOT DETECTED. The SARS-CoV-2 RNA is generally detectable in upper and lower  respiratory specimens during the acute phase of infection. The lowest  concentration of SARS-CoV-2 viral copies this assay can detect is 250  copies / mL. A negative result does not preclude SARS-CoV-2 infection  and should not be used as the sole basis for treatment or other  patient management decisions.  A negative result may occur with  improper specimen collection / handling, submission of specimen other  than nasopharyngeal swab, presence of viral mutation(s) within the  areas targeted by this assay, and inadequate number of viral copies  (<250 copies / mL). A negative result must be combined with clinical  observations, patient history, and epidemiological information. If result is POSITIVE SARS-CoV-2 target nucleic acids are DETECTED. The SARS-CoV-2 RNA is generally detectable in upper and lower  respiratory specimens dur ing the acute phase of infection.  Positive  results are indicative of active infection with SARS-CoV-2.  Clinical  correlation with patient history and other diagnostic information is  necessary to determine patient infection status.  Positive results do  not rule out bacterial infection or co-infection with other viruses. If result is PRESUMPTIVE POSTIVE SARS-CoV-2 nucleic acids MAY BE PRESENT.   A presumptive positive result was obtained on the submitted specimen  and confirmed on repeat testing.  While 2019 novel coronavirus   (SARS-CoV-2) nucleic acids may be present in the submitted sample  additional confirmatory testing may be necessary for epidemiological  and / or clinical management purposes  to differentiate between  SARS-CoV-2 and other Sarbecovirus currently known to infect humans.  If clinically indicated additional testing with an alternate test  methodology [phone]) is advised. The SARS-CoV-2 RNA is generally  detectable in upper and lower respiratory sp ecimens during the acute  phase of infection. The expected result is Negative. Fact Sheet for Patients:  StrictlyIdeas.no Fact Sheet for Healthcare Providers: BankingDealers.co.za This test is not yet approved or cleared by the [place] FDA and has been authorized for detection and/or diagnosis of SARS-CoV-2 by FDA under an Emergency Use Authorization (EUA).  This EUA will remain in effect (meaning this test can be used) for the duration of the COVID-19 declaration under Section 564(b)(1) of the Act, 21 U.S.C. section 360bbb-3(b)(1), unless the authorization is terminated or revoked sooner. Performed at [facility], [address] [place], [place] [address]   MRSA PCR Screening     Status: None   Collection Time: 03/10/19  2:28 AM  Result Value Ref Range Status   MRSA by PCR NEGATIVE NEGATIVE Final    Comment:        The GeneXpert MRSA Assay (FDA approved for NASAL specimens only), is one component of a comprehensive MRSA colonization surveillance program. It is not intended to diagnose MRSA infection nor to guide or monitor treatment for MRSA infections. Performed at [facility], [address] [place], [place] [address]   Culture, blood (Routine X 2) w Reflex to ID Panel     Status: None (Preliminary result)   Collection Time: 03/10/19  1:24 PM  Result Value Ref Range Status   Specimen Description BLOOD RIGHT HAND  Final   Special Requests   Final    BOTTLES DRAWN AEROBIC AND  ANAEROBIC Blood Culture adequate volume   Culture   Final    NO GROWTH 3 DAYS Performed at [facility], [address] [place], [place] [address]    Report Status PENDING  Incomplete  Culture, blood (Routine X 2) w Reflex to ID Panel     Status: Abnormal   Collection Time: 03/10/19  1:25 PM  Result Value Ref Range Status   Specimen Description   Final    BLOOD LEFT HAND Performed at [facility], [address] [place], [place] [address]    Special Requests   Final    BOTTLES DRAWN AEROBIC AND ANAEROBIC Blood Culture adequate volume Performed at [facility], [address], [place], [place] [address]    Culture  Setup Time   Final    GRAM POSITIVE COCCI ANAEROBIC Gram Stain Report Called to,Read Back By and Verified With: DANIELS J. @ [id number] ON [id number] BY HENERSON L CRITICAL RESULT CALLED TO, READ BACK BY AND VERIFIED WITH: PHARMD L POOLE [id number] AT 1327 BY CM    Culture (A)  Final    STAPHYLOCOCCUS SPECIES (COAGULASE NEGATIVE) THE SIGNIFICANCE OF ISOLATING THIS ORGANISM FROM A SINGLE SET OF BLOOD CULTURES WHEN MULTIPLE SETS ARE DRAWN IS UNCERTAIN. PLEASE NOTIFY THE MICROBIOLOGY DEPARTMENT WITHIN ONE WEEK IF SPECIATION AND SENSITIVITIES ARE REQUIRED. Performed at [facility], [place] [address], [place], [place] [address]    Report Status 03/13/2019 FINAL  Final  Blood Culture ID Panel (Reflexed)     Status: Abnormal   Collection Time: 03/10/19  1:25 PM  Result Value Ref Range Status   Enterococcus species NOT DETECTED NOT DETECTED Final   Listeria monocytogenes NOT DETECTED NOT DETECTED Final   Staphylococcus species DETECTED (A) NOT DETECTED Final    Comment: Methicillin (oxacillin) susceptible coagulase negative staphylococcus. Possible blood culture contaminant (unless isolated from more than one blood culture draw or clinical case suggests pathogenicity). No antibiotic treatment is indicated for blood  culture contaminants. CRITICAL RESULT CALLED TO, READ BACK BY AND  VERIFIED WITH: PHARMD L POOLE [id number] AT 9449 BY CM    Staphylococcus aureus (BCID) NOT DETECTED NOT DETECTED Final   Methicillin resistance NOT DETECTED NOT DETECTED Final   Streptococcus species NOT DETECTED NOT DETECTED Final   Streptococcus agalactiae NOT DETECTED NOT DETECTED Final   Streptococcus pneumoniae NOT DETECTED NOT DETECTED Final   Streptococcus pyogenes NOT DETECTED NOT DETECTED Final   Acinetobacter baumannii NOT DETECTED NOT DETECTED Final   Enterobacteriaceae species NOT DETECTED NOT DETECTED Final   Enterobacter cloacae complex NOT DETECTED NOT DETECTED Final   Escherichia coli NOT DETECTED NOT DETECTED Final   Klebsiella oxytoca NOT DETECTED NOT DETECTED Final   Klebsiella pneumoniae NOT DETECTED NOT DETECTED Final   Proteus species NOT DETECTED NOT DETECTED Final   Serratia marcescens NOT DETECTED NOT DETECTED Final   Haemophilus influenzae NOT DETECTED NOT DETECTED Final   Neisseria meningitidis NOT DETECTED NOT DETECTED Final   Pseudomonas aeruginosa NOT DETECTED NOT DETECTED Final   Candida albicans NOT DETECTED NOT DETECTED Final   Candida glabrata NOT DETECTED NOT DETECTED Final   Candida krusei NOT DETECTED NOT DETECTED Final   Candida parapsilosis NOT DETECTED NOT DETECTED Final   Candida tropicalis NOT DETECTED NOT DETECTED Final    Comment: Performed at [facility],  [address], [place], [place] [address]     Labs: BNP (last 3 results) Recent Labs    03/09/19 2031  BNP 9,357.0*   Basic Metabolic Panel: Recent Labs  Lab 03/09/19 2208 03/11/19 0409 03/12/19 0609 03/13/19 0530  NA 137 136 139 138  K 4.0 3.5 3.6 3.8  CL 106 103 103 103  CO2 23 25 29 26   GLUCOSE 87 94 92 90  BUN 26* 23* 28* 28*  CREATININE 1.62* 1.24 1.34* 1.35*  CALCIUM 8.0* 8.0* 8.5* 8.3*  PHOS  --   --   --  3.8   Liver Function Tests: Recent Labs  Lab 03/10/19 1328 03/13/19 0530  AST 30  --   ALT 28  --   ALKPHOS 93  --   BILITOT 0.6  --   PROT 7.5   --   ALBUMIN 2.4* 2.4*   No results for input(s): LIPASE, AMYLASE in the last 168 hours. No results for input(s): AMMONIA in the last 168 hours. CBC: Recent Labs  Lab 03/09/19 2208 03/10/19 0301 03/11/19 0409 03/13/19 0530  WBC 4.3 5.5 5.0 4.3  NEUTROABS 2.5 3.3 2.7  --   HGB 11.0* 9.6* 10.2* 11.0*  HCT 35.0* 30.5* 32.4* 35.7*  MCV 89.3 88.2 87.6 89.3  PLT 155 172 186 230   Cardiac Enzymes: Recent Labs  Lab 03/09/19 2031 03/10/19 0301 03/10/19 0908 03/10/19 1328  TROPONINI 0.13* 0.14* 0.17* 0.18*   BNP: Invalid input(s): POCBNP CBG: Recent Labs  Lab 03/09/19 2156  GLUCAP 91   D-Dimer No results for input(s): DDIMER in the last 72 hours. Hgb A1c No results for input(s): HGBA1C in the last 72 hours. Lipid Profile Recent Labs    03/11/19 0756  CHOL 182  HDL 18*  LDLCALC 127*  TRIG 185*  CHOLHDL 10.1   Thyroid function studies No results for input(s): TSH, T4TOTAL, T3FREE, THYROIDAB in the last 72 hours.  Invalid input(s): FREET3 Anemia work up Recent Labs    03/11/19 0756  VITAMINB12 263  FOLATE 14.0  FERRITIN 123  TIBC 239*  IRON 27*  RETICCTPCT 1.8   Urinalysis    Component Value Date/Time   COLORURINE YELLOW 03/10/2019 [place] 03/10/2019 1418   LABSPEC 1.011 03/10/2019 1418   PHURINE 7.0 03/10/2019 1418   GLUCOSEU NEGATIVE 03/10/2019 1418   HGBUR SMALL (A) 03/10/2019 1418   [place] 03/10/2019 1418   [place] 03/10/2019 1418   PROTEINUR NEGATIVE 03/10/2019 1418   NITRITE NEGATIVE 03/10/2019 1418   [place] 03/10/2019 1418   Sepsis Labs Invalid input(s): PROCALCITONIN,  WBC,  LACTICIDVEN Microbiology Recent Results (from the past 240 hour(s))  SARS Coronavirus 2 [facility] order, Performed in [place] hospital lab)     Status: None   Collection Time: 03/09/19  8:33 PM  Result Value Ref Range Status   SARS Coronavirus 2 NEGATIVE NEGATIVE Final    Comment: (NOTE) If result is  NEGATIVE SARS-CoV-2 target nucleic acids are NOT DETECTED. The SARS-CoV-2 RNA is generally detectable in upper and lower  respiratory specimens during the acute phase of infection. The lowest  concentration of SARS-CoV-2 viral copies this assay can detect is 250  copies / mL. A negative result does not preclude SARS-CoV-2 infection  and should not be used as the sole basis for treatment or other  patient management decisions.  A negative result may occur with  improper specimen collection / handling, submission of specimen other  than nasopharyngeal swab, presence of  viral mutation(s) within the  areas targeted by this assay, and inadequate number of viral copies  (<250 copies / mL). A negative result must be combined with clinical  observations, patient history, and epidemiological information. If result is POSITIVE SARS-CoV-2 target nucleic acids are DETECTED. The SARS-CoV-2 RNA is generally detectable in upper and lower  respiratory specimens dur ing the acute phase of infection.  Positive  results are indicative of active infection with SARS-CoV-2.  Clinical  correlation with patient history and other diagnostic information is  necessary to determine patient infection status.  Positive results do  not rule out bacterial infection or co-infection with other viruses. If result is PRESUMPTIVE POSTIVE SARS-CoV-2 nucleic acids MAY BE PRESENT.   A presumptive positive result was obtained on the submitted specimen  and confirmed on repeat testing.  While 2019 novel coronavirus  (SARS-CoV-2) nucleic acids may be present in the submitted sample  additional confirmatory testing may be necessary for epidemiological  and / or clinical management purposes  to differentiate between  SARS-CoV-2 and other Sarbecovirus currently known to infect humans.  If clinically indicated additional testing with an alternate test  methodology [id number]) is advised. The SARS-CoV-2 RNA is generally  detectable  in upper and lower respiratory sp ecimens during the acute  phase of infection. The expected result is Negative. Fact Sheet for Patients:  StrictlyIdeas.no Fact Sheet for Healthcare Providers: BankingDealers.co.za This test is not yet approved or cleared by the [place] FDA and has been authorized for detection and/or diagnosis of SARS-CoV-2 by FDA under an Emergency Use Authorization (EUA).  This EUA will remain in effect (meaning this test can be used) for the duration of the COVID-19 declaration under Section 564(b)(1) of the Act, 21 U.S.C. section 360bbb-3(b)(1), unless the authorization is terminated or revoked sooner. Performed at [facility], [address], [place], [place] [address]   MRSA PCR Screening     Status: None   Collection Time: 03/10/19  2:28 AM  Result Value Ref Range Status   MRSA by PCR NEGATIVE NEGATIVE Final    Comment:        The GeneXpert MRSA Assay (FDA approved for NASAL specimens only), is one component of a comprehensive MRSA colonization surveillance program. It is not intended to diagnose MRSA infection nor to guide or monitor treatment for MRSA infections. Performed at [facility], [address] [place], [place] [address]   Culture, blood (Routine X 2) w Reflex to ID Panel     Status: None (Preliminary result)   Collection Time: 03/10/19  1:24 PM  Result Value Ref Range Status   Specimen Description BLOOD RIGHT HAND  Final   Special Requests   Final    BOTTLES DRAWN AEROBIC AND ANAEROBIC Blood Culture adequate volume   Culture   Final    NO GROWTH 3 DAYS Performed at [facility], [address], [place], [place] [address]    Report Status PENDING  Incomplete  Culture, blood (Routine X 2) w Reflex to ID Panel     Status: Abnormal   Collection Time: 03/10/19  1:25 PM  Result Value Ref Range Status   Specimen Description   Final    BLOOD LEFT HAND Performed at [facility], [address] [place], [place] [address]    Special Requests   Final    BOTTLES DRAWN AEROBIC AND ANAEROBIC Blood Culture adequate volume Performed at [facility], [address], [place], [place] [address]    Culture  Setup Time  Final    GRAM POSITIVE COCCI ANAEROBIC Gram Stain Report Called to,Read Back By and Verified With: DANIELS J. @ 0354 ON [id number] BY HENERSON L CRITICAL RESULT CALLED TO, READ BACK BY AND VERIFIED WITH: PHARMD L POOLE [id number] AT 1327 BY CM    Culture (A)  Final    STAPHYLOCOCCUS SPECIES (COAGULASE NEGATIVE) THE SIGNIFICANCE OF ISOLATING THIS ORGANISM FROM A SINGLE SET OF BLOOD CULTURES WHEN MULTIPLE SETS ARE DRAWN IS UNCERTAIN. PLEASE NOTIFY THE MICROBIOLOGY DEPARTMENT WITHIN ONE WEEK IF SPECIATION AND SENSITIVITIES ARE REQUIRED. Performed at [facility], [place] [address] [place], [place] [address]    Report Status 03/13/2019 FINAL  Final  Blood Culture ID Panel (Reflexed)     Status: Abnormal   Collection Time: 03/10/19  1:25 PM  Result Value Ref Range Status   Enterococcus species NOT DETECTED NOT DETECTED Final   Listeria monocytogenes NOT DETECTED NOT DETECTED Final   Staphylococcus species DETECTED (A) NOT DETECTED Final    Comment: Methicillin (oxacillin) susceptible coagulase negative staphylococcus. Possible blood culture contaminant (unless isolated from more than one blood culture draw or clinical case suggests pathogenicity). No antibiotic treatment is indicated for blood  culture contaminants. CRITICAL RESULT CALLED TO, READ BACK BY AND VERIFIED WITH: PHARMD L POOLE [id number] AT [id number] BY CM    Staphylococcus aureus (BCID) NOT DETECTED NOT DETECTED Final   Methicillin resistance NOT DETECTED NOT DETECTED Final   Streptococcus species NOT DETECTED NOT DETECTED Final   Streptococcus agalactiae NOT DETECTED NOT DETECTED Final   Streptococcus pneumoniae NOT DETECTED NOT DETECTED Final   Streptococcus pyogenes NOT DETECTED NOT DETECTED Final   Acinetobacter  baumannii NOT DETECTED NOT DETECTED Final   Enterobacteriaceae species NOT DETECTED NOT DETECTED Final   Enterobacter cloacae complex NOT DETECTED NOT DETECTED Final   Escherichia coli NOT DETECTED NOT DETECTED Final   Klebsiella oxytoca NOT DETECTED NOT DETECTED Final   Klebsiella pneumoniae NOT DETECTED NOT DETECTED Final   Proteus species NOT DETECTED NOT DETECTED Final   Serratia marcescens NOT DETECTED NOT DETECTED Final   Haemophilus influenzae NOT DETECTED NOT DETECTED Final   Neisseria meningitidis NOT DETECTED NOT DETECTED Final   Pseudomonas aeruginosa NOT DETECTED NOT DETECTED Final   Candida albicans NOT DETECTED NOT DETECTED Final   Candida glabrata NOT DETECTED NOT DETECTED Final   Candida krusei NOT DETECTED NOT DETECTED Final   Candida parapsilosis NOT DETECTED NOT DETECTED Final   Candida tropicalis NOT DETECTED NOT DETECTED Final    Comment: Performed at [facility], [address] [place], [place] [address]   Time coordinating discharge: 35 minutes   SIGNED:  Irwin Brakeman, MD  [facility] 03/13/2019, 5:38 PM How to contact the [facility] Attending or Consulting provider [place] or covering provider during after hours [place], for this patient?  1. Check the care team in [facility] and look for a) attending/consulting [facility] provider listed and b) the [facility] team listed 2. Log into [email or web address] and use [place]'s universal password to access. If you do not have the password, please contact the hospital operator. 3. Locate the [facility] provider you are looking for under [facility] and page to a number that you can be directly reached. 4. If you still have difficulty reaching the provider, please page the [facility] (Director on Call) for the Hospitalists listed on amion for assistance.

## 2019-03-13 NOTE — Progress Notes (Signed)
Progress Note  Patient Name: Richard Cox Date of Encounter: 03/13/2019  Primary Cardiologist: New  Subjective   Denies CP Breathing is OK    Inpatient Medications    Scheduled Meds: . amLODipine  10 mg Oral Daily  . amoxicillin-clavulanate  1 tablet Oral Q12H  . carvedilol  12.5 mg Oral BID WC  . enoxaparin (LOVENOX) injection  40 mg Subcutaneous Q24H  . ferrous sulfate  325 mg Oral Q breakfast  . [START ON 03/14/2019] furosemide  40 mg Oral Daily  . hydrALAZINE  25 mg Oral Q8H  . isosorbide mononitrate  30 mg Oral Daily  . sodium chloride flush  3 mL Intravenous Q12H  . Vitamin D (Ergocalciferol)  50,000 Units Oral Once per day on Mon Thu   Continuous Infusions: . sodium chloride     PRN Meds: sodium chloride, acetaminophen, hydrALAZINE, ondansetron (ZOFRAN) IV, sodium chloride flush   Vital Signs    Vitals:   03/12/19 2114 03/13/19 0513 03/13/19 0856 03/13/19 1200  BP: 137/74 (!) 145/99 (!) 146/90 133/81  Pulse: 82 83    Resp: 20 20    Temp: 99.4 F (37.4 C) 98.6 F (37 C)    TempSrc: Oral Oral    SpO2: 98% 98%    Weight:  71.3 kg    Height:        Intake/Output Summary (Last 24 hours) at 03/13/2019 1336 Last data filed at 03/13/2019 0900 Gross per 24 hour  Intake 1080 ml  Output 1000 ml  Net 80 ml   Net neg 7.8 L   Last 3 Weights 03/13/2019 03/12/2019 03/10/2019  Weight (lbs) 157 lb 3 oz 156 lb 12 oz 160 lb 7.9 oz  Weight (kg) 71.3 kg 71.1 kg 72.8 kg      Telemetry    SR   - Personally Reviewed  ECG     Physical Exam   GEN: No acute distress.   Neck: No JVD Cardiac: RRR, no murmurs, rubs, or gallops.  Respiratory: Clear to auscultation bilaterally. GI: Soft, nontender, non-distended  MS: No edema; No deformity. Neuro:  Nonfocal  Psych: Normal affect   Labs    Chemistry Recent Labs  Lab 03/10/19 1328 03/11/19 0409 03/12/19 0609 03/13/19 0530  NA  --  136 139 138  K  --  3.5 3.6 3.8  CL  --  103 103 103  CO2  --  25 29 26   GLUCOSE  --   94 92 90  BUN  --  23* 28* 28*  CREATININE  --  1.24 1.34* 1.35*  CALCIUM  --  8.0* 8.5* 8.3*  PROT 7.5  --   --   --   ALBUMIN 2.4*  --   --  2.4*  AST 30  --   --   --   ALT 28  --   --   --   ALKPHOS 93  --   --   --   BILITOT 0.6  --   --   --   GFRNONAA  --  >60 >60 >60  GFRAA  --  >60 >60 >60  ANIONGAP  --  8 7 9      Hematology Recent Labs  Lab 03/10/19 0301 03/11/19 0409 03/11/19 0756 03/13/19 0530  WBC 5.5 5.0  --  4.3  RBC 3.46* 3.70* 3.88* 4.00*  HGB 9.6* 10.2*  --  11.0*  HCT 30.5* 32.4*  --  35.7*  MCV 88.2 87.6  --  89.3  MCH  27.7 27.6  --  27.5  MCHC 31.5 31.5  --  30.8  RDW 16.6* 16.0*  --  15.8*  PLT 172 186  --  230    Cardiac Enzymes Recent Labs  Lab 03/09/19 2031 03/10/19 0301 03/10/19 0908 03/10/19 1328  TROPONINI 0.13* 0.14* 0.17* 0.18*   No results for input(s): TROPIPOC in the last 168 hours.   BNP Recent Labs  Lab 03/09/19 2031  BNP 2,445.0*     DDimer No results for input(s): DDIMER in the last 168 hours.   Radiology    No results found.  Cardiac Studies  IMPRESSIONS    1. The left ventricle has mild-moderately reduced systolic function, with an ejection fraction of 40-45%. The cavity size was normal. There is severely increased left ventricular wall thickness. Left ventricular diastolic Doppler parameters are  consistent with pseudonormalization. Left ventricular diffuse hypokinesis. Although possibly consistent with hypertensive cardiomyopathy in light of reported noncompliance with medical therapy and uncontrolled hypertension, the degree of LVH and  associated atrial enlargement also suggest a hypertrophic cardiomyopathy and the possibility of an infiltrative process.  2. The right ventricle has normal systolic function. The cavity was normal. There is moderately increased right ventricular wall thickness. Right ventricular systolic pressure normal with an estimated pressure of 33.7 mmHg.  3. Left atrial size was severely  dilated.  4. Right atrial size was moderately dilated.  5. Small to moderate pericardial effusion. Mitral outflow pattern does not suggest tamponade physiology.  6. The pericardial effusion is circumferential.  7. The tricuspid valve is grossly normal.  8. The aortic valve is tricuspid.  9. The aortic root is normal in size and structure. 10. The inferior vena cava was dilated in size with >50% respiratory variability. 11. The mitral valve is grossly normal.  FINDINGS  Left Ventricle: The left ventricle has mild-moderately reduced systolic function, with an ejection fraction of 40-45%. The cavity size was normal. There is severely increased left ventricular wall thickness. Left ventricular diastolic Doppler parameters  are consistent with pseudonormalization. Left ventricular diffuse hypokinesis.  Right Ventricle: The right ventricle has normal systolic function. The cavity was normal. There is moderately increased right ventricular wall thickness. Right ventricular systolic pressure normal with an estimated pressure of 33.7 mmHg.  Left Atrium: Left atrial size was severely dilated.  Right Atrium: Right atrial size was moderately dilated. Right atrial pressure is estimated at 8 mmHg.  Interatrial Septum: No atrial level shunt detected by color flow Doppler.  Pericardium: A moderately sized pericardial effusion is present. The pericardial effusion is circumferential.  Mitral Valve: The mitral valve is grossly normal. Mitral valve regurgitation is trivial by color flow Doppler.  Tricuspid Valve: The tricuspid valve is grossly normal. Tricuspid valve regurgitation is mild by color flow Doppler.  Aortic Valve: The aortic valve is tricuspid Aortic valve regurgitation was not visualized by color flow Doppler.  Pulmonic Valve: The pulmonic valve was grossly normal. Pulmonic valve regurgitation is mild by color flow Doppler.  Aorta: The aortic root is normal in size and structure.   Venous: The inferior vena cava is dilated in size with greater than 50% respiratory variability.    +--------------+--------++ LEFT VENTRICLE         +--------------+--------++ PLAX 2D                +--------------+--------++ LVIDd:        4.67 cm  +--------------+--------++ LVIDs:        3.32 cm  +--------------+--------++ LV PW:  1.64 cm  +--------------+--------++ LV IVS:       2.41 cm  +--------------+--------++ LVOT diam:    2.50 cm  +--------------+--------++ LV SV:        56 ml    +--------------+--------++ LV SV Index:  29.05    +--------------+--------++ LVOT Area:    4.91 cm +--------------+--------++                        +--------------+--------++   +------------------+---------++ LV Volumes (MOD)            +------------------+---------++ LV area d, A2C:   26.90 cm +------------------+---------++ LV area d, A4C:   33.00 cm +------------------+---------++ LV area s, A2C:   20.10 cm +------------------+---------++ LV area s, A4C:   23.80 cm +------------------+---------++ LV major d, A2C:  7.63 cm   +------------------+---------++ LV major d, A4C:  9.44 cm   +------------------+---------++ LV major s, A2C:  7.47 cm   +------------------+---------++ LV major s, A4C:  8.55 cm   +------------------+---------++ LV vol d, MOD A2C:80.6 ml   +------------------+---------++ LV vol d, MOD A4C:101.0 ml  +------------------+---------++ LV vol s, MOD A2C:46.2 ml   +------------------+---------++ LV vol s, MOD A4C:58.4 ml   +------------------+---------++ LV SV MOD A2C:    34.4 ml   +------------------+---------++ LV SV MOD A4C:    101.0 ml  +------------------+---------++ LV SV MOD BP:     45.2 ml   +------------------+---------++  +---------------+----------++ RIGHT VENTRICLE           +---------------+----------++ RV S  prime:    20.40 cm/s +---------------+----------++ TAPSE (M-mode):2.2 cm     +---------------+----------++ RVSP:          33.7 mmHg  +---------------+----------++  +---------------+--------++-----------++ LEFT ATRIUM            Index       +---------------+--------++-----------++ LA diam:       5.00 cm 2.55 cm/m  +---------------+--------++-----------++ LA Vol (A2C):  127.0 ml64.81 ml/m +---------------+--------++-----------++ LA Vol (A4C):  148.0 ml75.53 ml/m +---------------+--------++-----------++ LA Biplane Vol:147.0 ml75.02 ml/m +---------------+--------++-----------++ +------------+---------++-----------++ RIGHT ATRIUM         Index       +------------+---------++-----------++ RA Pressure:8.00 mmHg            +------------+---------++-----------++ RA Area:    23.40 cm            +------------+---------++-----------++ RA Volume:  86.00 ml 43.89 ml/m +------------+---------++-----------++  +------------+-----------++ AORTIC VALVE            +------------+-----------++ LVOT Vmax:  120.00 cm/s +------------+-----------++ LVOT Vmean: 77.900 cm/s +------------+-----------++ LVOT VTI:   0.193 m     +------------+-----------++   +-------------+-------++ AORTA                +-------------+-------++ Ao Root diam:3.40 cm +-------------+-------++  +--------------+----------++ +---------------+-----------++ MITRAL VALVE             TRICUSPID VALVE            +--------------+----------++ +---------------+-----------++ MV Area (PHT):5.84 cm   TR Peak grad:  25.7 mmHg   +--------------+----------++ +---------------+-----------++ MV PHT:       37.70 msec TR Vmax:       273.00 cm/s +--------------+----------++ +---------------+-----------++ MV Decel Time:130 msec   Estimated RAP: 8.00 mmHg   +--------------+----------++  +---------------+-----------++ +--------------+----------++ RVSP:          33.7 mmHg   MV E velocity:92.10 cm/s +---------------+-----------++ +--------------+----------++ MV A velocity:45.00 cm/s +--------------+-------+ +--------------+----------++ SHUNTS  MV E/A ratio: 2.05       +--------------+-------+ +--------------+----------++ Systemic VTI: 0.19 m                               +--------------+-------+                              Systemic Diam:2.50 cm                              +--------------+-------+    Rozann Lesches MD Electronically signed by Rozann Lesches MD Signature Date/Time: 03/10/2019/11:25:04 AM   IM  Patient Profile     38 y.o. male Hx of HTN   PResents with CHF    Assessment & Plan    1.  Acute systolic/ diastolic CHF   Echo as noted above  PRbably exacerbated by severe HTN  Untreated. Echo with severe LVH  Atria are severey dilated    Patient has been diuresed significantly   Note change pto PO lasix    Would increase coreg to 25 bid   Continue imdur and hydralazine  Pt instructed on 2 G NA   1500 to 2000 cc fluid  NOte work up in progress for causes of CHF   Still pending   Poss MRI as outpt     OK to d/c home from a cardiac standpoint    2   Trop elevation   Minimal   Flat  No evid of ischemia    HTN  BLood pressure is significantly improved   Would increase coreg to 25 bid  3  CKD  Cr 1.35   Follow     WIll make sure pt has appt for f/u in clinic   Knows to weigh daily   For questions or updates, please contact [facility] Please consult [email or web address] for contact info under        Signed, Dorris Carnes, MD  03/13/2019, 1:36 PM

## 2019-03-13 NOTE — Progress Notes (Signed)
Nsg Discharge Note  Admit Date:  03/09/2019 Discharge date: 03/13/2019   Trevin Gartrell to be D/C'd Home per MD order.  AVS completed.  Copy for chart, and copy for patient signed, and dated. Patient/caregiver able to verbalize understanding.  Discharge Medication: Allergies as of 03/13/2019      Reactions   Neosporin [neomycin-bacitracin Zn-polymyx] Rash      Medication List    STOP taking these medications   ibuprofen 800 MG tablet Commonly known as:  ADVIL     TAKE these medications   acetaminophen 500 MG tablet Commonly known as:  TYLENOL Take 500-1,500 mg by mouth daily as needed for mild pain or moderate pain.   carvedilol 25 MG tablet Commonly known as:  COREG Take 1 tablet (25 mg total) by mouth 2 (two) times daily with a meal for 30 days. Start taking on:  Mar 14, 2019   docusate sodium 100 MG capsule Commonly known as:  Colace Take 1 capsule (100 mg total) by mouth daily for 30 days.   ferrous sulfate 325 (65 FE) MG tablet Take 1 tablet (325 mg total) by mouth daily with breakfast for 30 days. Start taking on:  Mar 14, 2019   furosemide 40 MG tablet Commonly known as:  LASIX Take 1 tablet (40 mg total) by mouth daily for 30 days. Start taking on:  Mar 14, 2019   hydrALAZINE 25 MG tablet Commonly known as:  APRESOLINE Take 1 tablet (25 mg total) by mouth every 8 (eight) hours for 30 days.   isosorbide mononitrate 30 MG 24 hr tablet Commonly known as:  IMDUR Take 1 tablet (30 mg total) by mouth daily for 30 days. Start taking on:  Mar 14, 2019   potassium chloride 10 MEQ tablet Commonly known as:  K-DUR Take 1 tablet (10 mEq total) by mouth daily for 30 days. Start taking on:  Mar 14, 2019   Vitamin D (Ergocalciferol) 1.25 MG (50000 UT) Caps capsule Commonly known as:  DRISDOL Take 1 capsule (50,000 Units total) by mouth 2 (two) times a week for 30 days. Start taking on:  Mar 16, 2019       Discharge Assessment: Vitals:   03/13/19 1200 03/13/19 1502  BP:  133/81 (!) 134/93  Pulse:  83  Resp:  18  Temp:  98.2 F (36.8 C)  SpO2:  99%   Skin clean, dry and intact without evidence of skin break down, no evidence of skin tears noted. IV catheter discontinued intact. Site without signs and symptoms of complications - no redness or edema noted at insertion site, patient denies c/o pain - only slight tenderness at site.  Dressing with slight pressure applied.  D/c Instructions-Education: Discharge instructions given to patient/family with verbalized understanding. D/c education completed with patient/family including follow up instructions, medication list, d/c activities limitations if indicated, with other d/c instructions as indicated by MD - patient able to verbalize understanding, all questions fully answered. Patient instructed to return to ED, call 911, or call MD for any changes in condition.  Patient escorted via [place], and D/C home via private auto.  Loa Socks, RN 03/13/2019 5:30 PM

## 2019-03-14 ENCOUNTER — Telehealth: Payer: Self-pay | Admitting: Infectious Disease

## 2019-03-14 LAB — UPEP/UIFE/LIGHT CHAINS/TP, 24-HR UR
% BETA, Urine: 10.9 %
ALPHA 1 URINE: 5.7 %
Albumin, U: 58.4 %
Alpha 2, Urine: 7.3 %
Free Kappa Lt Chains,Ur: 581.76 mg/L — ABNORMAL HIGH (ref 0.63–113.79)
Free Kappa/Lambda Ratio: 9.11 (ref 1.03–31.76)
Free Lambda Lt Chains,Ur: 63.88 mg/L — ABNORMAL HIGH (ref 0.47–11.77)
GAMMA GLOBULIN URINE: 17.7 %
Total Protein, Urine-Ur/day: 1452 mg/24 hr — ABNORMAL HIGH (ref 30–150)
Total Protein, Urine: 60.5 mg/dL
Total Volume: 2400

## 2019-03-14 LAB — T-HELPER CELLS (CD4) COUNT (NOT AT ARMC)
CD4 % Helper T Cell: 11 % — ABNORMAL LOW (ref 33–65)
CD4 T Cell Abs: 188 /uL — ABNORMAL LOW (ref 400–1790)

## 2019-03-14 NOTE — Telephone Encounter (Signed)
I have called patient and left message for him to call for appointment

## 2019-03-14 NOTE — Telephone Encounter (Signed)
Can we get this newly diagnosed pt into [facility] asap  The hospitalist already discussed his screening test being positive and attempted to arrange clinic appt with [place] at [facility]

## 2019-03-14 NOTE — Telephone Encounter (Signed)
As long as he knows we can get him in ASAP. Information given to [facility] to schedule.

## 2019-03-15 LAB — CULTURE, BLOOD (ROUTINE X 2)
Culture: NO GROWTH
Special Requests: ADEQUATE

## 2019-03-15 NOTE — Telephone Encounter (Signed)
Ok thanks [place] if no call back send DIS to see him

## 2019-03-16 LAB — HIV-1 RNA QUANT-NO REFLEX-BLD
HIV 1 RNA Quant: 65200 copies/mL
LOG10 HIV-1 RNA: 4.814 log10copy/mL

## 2019-04-09 ENCOUNTER — Other Ambulatory Visit: Payer: Self-pay | Admitting: Family Medicine

## 2019-04-09 NOTE — Progress Notes (Signed)
Pt called saying he is running out of his meds.  His appt with PCP is on June 4 which he plans to keep.  I authorized a refill of his cardiac meds for 1 month to give him time to get his appt.  I made patient aware that [facility] clinic is trying to get him in for an appointment and he needs to make appt with cardiology as well.  He verbalized understanding.   Murvin Natal MD

## 2019-09-17 ENCOUNTER — Other Ambulatory Visit: Payer: Self-pay

## 2019-09-17 ENCOUNTER — Other Ambulatory Visit (HOSPITAL_COMMUNITY): Payer: Self-pay

## 2019-09-17 ENCOUNTER — Inpatient Hospital Stay (HOSPITAL_COMMUNITY)
Admission: EM | Admit: 2019-09-17 | Discharge: 2019-09-19 | DRG: 291 | Disposition: A | Discharge status: Home / Self Care | Payer: Self-pay | Attending: Family Medicine | Admitting: Family Medicine

## 2019-09-17 ENCOUNTER — Emergency Department (HOSPITAL_COMMUNITY): Payer: Self-pay

## 2019-09-17 ENCOUNTER — Encounter (HOSPITAL_COMMUNITY): Payer: Self-pay | Admitting: Emergency Medicine

## 2019-09-17 DIAGNOSIS — R072 Precordial pain: Secondary | ICD-10-CM

## 2019-09-17 DIAGNOSIS — Z21 Asymptomatic human immunodeficiency virus [HIV] infection status: Secondary | ICD-10-CM | POA: Diagnosis present

## 2019-09-17 DIAGNOSIS — Z809 Family history of malignant neoplasm, unspecified: Secondary | ICD-10-CM

## 2019-09-17 DIAGNOSIS — I5042 Chronic combined systolic (congestive) and diastolic (congestive) heart failure: Secondary | ICD-10-CM | POA: Diagnosis present

## 2019-09-17 DIAGNOSIS — Z720 Tobacco use: Secondary | ICD-10-CM | POA: Diagnosis present

## 2019-09-17 DIAGNOSIS — Z91148 Patient's other noncompliance with medication regimen for other reason: Secondary | ICD-10-CM

## 2019-09-17 DIAGNOSIS — I130 Hypertensive heart and chronic kidney disease with heart failure and stage 1 through stage 4 chronic kidney disease, or unspecified chronic kidney disease: Principal | ICD-10-CM | POA: Diagnosis present

## 2019-09-17 DIAGNOSIS — R0602 Shortness of breath: Secondary | ICD-10-CM

## 2019-09-17 DIAGNOSIS — I5043 Acute on chronic combined systolic (congestive) and diastolic (congestive) heart failure: Secondary | ICD-10-CM | POA: Diagnosis present

## 2019-09-17 DIAGNOSIS — I422 Other hypertrophic cardiomyopathy: Secondary | ICD-10-CM | POA: Diagnosis present

## 2019-09-17 DIAGNOSIS — Z888 Allergy status to other drugs, medicaments and biological substances status: Secondary | ICD-10-CM

## 2019-09-17 DIAGNOSIS — B20 Human immunodeficiency virus [HIV] disease: Secondary | ICD-10-CM | POA: Diagnosis present

## 2019-09-17 DIAGNOSIS — Z833 Family history of diabetes mellitus: Secondary | ICD-10-CM

## 2019-09-17 DIAGNOSIS — Z87891 Personal history of nicotine dependence: Secondary | ICD-10-CM

## 2019-09-17 DIAGNOSIS — I160 Hypertensive urgency: Secondary | ICD-10-CM

## 2019-09-17 DIAGNOSIS — J810 Acute pulmonary edema: Secondary | ICD-10-CM

## 2019-09-17 DIAGNOSIS — I5023 Acute on chronic systolic (congestive) heart failure: Secondary | ICD-10-CM | POA: Diagnosis present

## 2019-09-17 DIAGNOSIS — Z9114 Patient's other noncompliance with medication regimen: Secondary | ICD-10-CM

## 2019-09-17 DIAGNOSIS — I509 Heart failure, unspecified: Secondary | ICD-10-CM

## 2019-09-17 DIAGNOSIS — F1010 Alcohol abuse, uncomplicated: Secondary | ICD-10-CM | POA: Diagnosis present

## 2019-09-17 DIAGNOSIS — N189 Chronic kidney disease, unspecified: Secondary | ICD-10-CM | POA: Diagnosis present

## 2019-09-17 DIAGNOSIS — I313 Pericardial effusion (noninflammatory): Secondary | ICD-10-CM | POA: Diagnosis present

## 2019-09-17 DIAGNOSIS — F1910 Other psychoactive substance abuse, uncomplicated: Secondary | ICD-10-CM | POA: Diagnosis present

## 2019-09-17 DIAGNOSIS — Z20828 Contact with and (suspected) exposure to other viral communicable diseases: Secondary | ICD-10-CM | POA: Diagnosis present

## 2019-09-17 DIAGNOSIS — F1911 Other psychoactive substance abuse, in remission: Secondary | ICD-10-CM | POA: Diagnosis present

## 2019-09-17 DIAGNOSIS — I10 Essential (primary) hypertension: Secondary | ICD-10-CM | POA: Diagnosis present

## 2019-09-17 DIAGNOSIS — E559 Vitamin D deficiency, unspecified: Secondary | ICD-10-CM | POA: Diagnosis present

## 2019-09-17 HISTORY — DX: Heart failure, unspecified: I50.9

## 2019-09-17 LAB — BASIC METABOLIC PANEL
Anion gap: 7 (ref 5–15)
BUN: 13 mg/dL (ref 6–20)
CO2: 22 mmol/L (ref 22–32)
Calcium: 8.8 mg/dL — ABNORMAL LOW (ref 8.9–10.3)
Chloride: 105 mmol/L (ref 98–111)
Creatinine, Ser: 1 mg/dL (ref 0.61–1.24)
GFR calc Af Amer: >60 mL/min (ref >60)
GFR calc non Af Amer: >60 mL/min (ref >60)
Glucose, Bld: 89 mg/dL (ref 70–99)
Potassium: 3.6 mmol/L (ref 3.5–5.1)
Sodium: 134 mmol/L — ABNORMAL LOW (ref 135–145)

## 2019-09-17 LAB — CBC
HCT: 36.2 % — ABNORMAL LOW (ref 39.0–52.0)
Hemoglobin: 11.7 g/dL — ABNORMAL LOW (ref 13.0–17.0)
MCH: 28.1 pg (ref 26.0–34.0)
MCHC: 32.3 g/dL (ref 30.0–36.0)
MCV: 87 fL (ref 80.0–100.0)
Platelets: 204 10*3/uL (ref 150–400)
RBC: 4.16 MIL/uL — ABNORMAL LOW (ref 4.22–5.81)
RDW: 13.7 % (ref 11.5–15.5)
WBC: 5.1 10*3/uL (ref 4.0–10.5)
nRBC: 0 % (ref 0.0–0.2)

## 2019-09-17 LAB — TROPONIN I (HIGH SENSITIVITY)
Troponin I (High Sensitivity): 50 ng/L — ABNORMAL HIGH (ref <18)
Troponin I (High Sensitivity): 54 ng/L — ABNORMAL HIGH (ref <18)
Troponin I (High Sensitivity): 57 ng/L — ABNORMAL HIGH (ref <18)

## 2019-09-17 LAB — BRAIN NATRIURETIC PEPTIDE: B Natriuretic Peptide: 589 pg/mL — ABNORMAL HIGH (ref 0.0–100.0)

## 2019-09-17 MED ORDER — ONDANSETRON HCL 4 MG PO TABS: 4.0000 mg | ORAL_TABLET | Freq: Four times a day (QID) | ORAL | Status: DC | PRN

## 2019-09-17 MED ORDER — HYDRALAZINE HCL 25 MG PO TABS
25.0000 mg | ORAL_TABLET | Freq: Three times a day (TID) | ORAL | Status: DC
  Administered 2019-09-18 – 2019-09-19 (×5): 25 mg via ORAL
  Filled 2019-09-17 (×6): qty 1

## 2019-09-17 MED ORDER — ENOXAPARIN SODIUM 40 MG/0.4ML ~~LOC~~ SOLN: 40.0000 mg | SUBCUTANEOUS | Status: DC

## 2019-09-17 MED ORDER — TRAMADOL HCL 50 MG PO TABS
50.0000 mg | ORAL_TABLET | Freq: Three times a day (TID) | ORAL | Status: DC | PRN
  Administered 2019-09-18 – 2019-09-19 (×2): 50 mg via ORAL
  Filled 2019-09-17 (×2): qty 1

## 2019-09-17 MED ORDER — IOHEXOL 350 MG/ML SOLN
100.0000 mL | Freq: Once | INTRAVENOUS | Status: AC | PRN
  Administered 2019-09-17: 100 mL via INTRAVENOUS

## 2019-09-17 MED ORDER — SODIUM CHLORIDE 0.9 % IV SOLN: 250.0000 mL | INTRAVENOUS | Status: DC | PRN

## 2019-09-17 MED ORDER — ONDANSETRON HCL 4 MG/2ML IJ SOLN: 4.0000 mg | Freq: Four times a day (QID) | INTRAMUSCULAR | Status: DC | PRN

## 2019-09-17 MED ORDER — FERROUS SULFATE 325 (65 FE) MG PO TABS
325.0000 mg | ORAL_TABLET | Freq: Every day | ORAL | Status: DC
  Administered 2019-09-18 – 2019-09-19 (×2): 325 mg via ORAL
  Filled 2019-09-17 (×3): qty 1

## 2019-09-17 MED ORDER — FUROSEMIDE 40 MG PO TABS: 40.0000 mg | ORAL_TABLET | Freq: Every day | ORAL | Status: DC

## 2019-09-17 MED ORDER — NITROGLYCERIN 0.4 MG SL SUBL: 0.4000 mg | SUBLINGUAL_TABLET | SUBLINGUAL | Status: DC | PRN

## 2019-09-17 MED ORDER — HYDRALAZINE HCL 25 MG PO TABS
25.0000 mg | ORAL_TABLET | Freq: Three times a day (TID) | ORAL | Status: DC
  Administered 2019-09-17 (×2): 25 mg via ORAL
  Filled 2019-09-17 (×2): qty 1

## 2019-09-17 MED ORDER — IOHEXOL 300 MG/ML  SOLN: 100.0000 mL | Freq: Once | INTRAMUSCULAR | Status: DC | PRN

## 2019-09-17 MED ORDER — MORPHINE SULFATE (PF) 2 MG/ML IV SOLN
0.5000 mg | INTRAVENOUS | Status: DC | PRN
  Administered 2019-09-18 (×4): 0.5 mg via INTRAVENOUS
  Filled 2019-09-17 (×4): qty 1

## 2019-09-17 MED ORDER — ACETAMINOPHEN 650 MG RE SUPP: 650.0000 mg | Freq: Four times a day (QID) | RECTAL | Status: DC | PRN

## 2019-09-17 MED ORDER — BISACODYL 10 MG RE SUPP: 10.0000 mg | Freq: Every day | RECTAL | Status: DC | PRN

## 2019-09-17 MED ORDER — ISOSORBIDE MONONITRATE ER 60 MG PO TB24
30.0000 mg | ORAL_TABLET | Freq: Every day | ORAL | Status: DC
  Administered 2019-09-18: 30 mg via ORAL
  Filled 2019-09-17 (×2): qty 1

## 2019-09-17 MED ORDER — ASPIRIN 81 MG PO CHEW
324.0000 mg | CHEWABLE_TABLET | Freq: Once | ORAL | Status: AC
  Administered 2019-09-17: 16:00:00 324 mg via ORAL
  Filled 2019-09-17: qty 4

## 2019-09-17 MED ORDER — CARVEDILOL 12.5 MG PO TABS
12.5000 mg | ORAL_TABLET | Freq: Two times a day (BID) | ORAL | Status: DC
  Administered 2019-09-18: 12.5 mg via ORAL
  Filled 2019-09-17: qty 1

## 2019-09-17 MED ORDER — SODIUM CHLORIDE 0.9% FLUSH
3.0000 mL | Freq: Two times a day (BID) | INTRAVENOUS | Status: DC
  Administered 2019-09-18 – 2019-09-19 (×3): 3 mL via INTRAVENOUS

## 2019-09-17 MED ORDER — SODIUM CHLORIDE 0.9% FLUSH
3.0000 mL | INTRAVENOUS | Status: DC | PRN
  Administered 2019-09-19: 08:00:00 3 mL via INTRAVENOUS
  Filled 2019-09-17: qty 3

## 2019-09-17 MED ORDER — ACETAMINOPHEN 325 MG PO TABS: 650.0000 mg | ORAL_TABLET | Freq: Four times a day (QID) | ORAL | Status: DC | PRN

## 2019-09-17 MED ORDER — POLYETHYLENE GLYCOL 3350 17 G PO PACK
17.0000 g | PACK | Freq: Every day | ORAL | Status: DC | PRN
  Administered 2019-09-18: 21:00:00 17 g via ORAL
  Filled 2019-09-17: qty 1

## 2019-09-17 MED ORDER — SODIUM CHLORIDE 0.9 % IV SOLN
INTRAVENOUS | Status: DC
  Administered 2019-09-17: 16:00:00 via INTRAVENOUS

## 2019-09-17 MED ORDER — FUROSEMIDE 10 MG/ML IJ SOLN
40.0000 mg | Freq: Once | INTRAMUSCULAR | Status: AC
  Administered 2019-09-17: 40 mg via INTRAVENOUS
  Filled 2019-09-17: qty 4

## 2019-09-17 MED ORDER — ASPIRIN EC 81 MG PO TBEC
81.0000 mg | DELAYED_RELEASE_TABLET | Freq: Every day | ORAL | Status: DC
  Administered 2019-09-18 – 2019-09-19 (×2): 81 mg via ORAL
  Filled 2019-09-17 (×2): qty 1

## 2019-09-17 MED ORDER — POTASSIUM CHLORIDE ER 10 MEQ PO TBCR
10.0000 meq | EXTENDED_RELEASE_TABLET | Freq: Every day | ORAL | Status: DC
  Administered 2019-09-18 – 2019-09-19 (×2): 10 meq via ORAL
  Filled 2019-09-17 (×5): qty 1

## 2019-09-17 NOTE — ED Provider Notes (Addendum)
[facility] EMERGENCY DEPARTMENT Provider Note   CSN: [id number] Arrival date & time: 09/17/19  1234     History   Chief Complaint Chief Complaint  Patient presents with  . Chest Pain    HPI Richard Cox is a 38 y.o. male.     Patient with admission in the spring for hypertension urgency and some congestive heart failure.  Has been out of medications now for a period of time does not have primary care doctor.  Is to be taking Coreg Lasix hydralazine and Imdur for these problems.  Patient also with a complaint of some mild substernal chest pain, goes to the left side of the neck.  For the past several days.  Patient states it does not hurt more to move the neck.  He gets short of breath if he lays down feels a little more comfortable if he sits up.  Denies any fever or coughs.  Denies any leg swelling.  Patient came in mostly just to get the blood pressure medicines renewed.  He noted that his blood pressure was high today.  Denies any headache.     Past Medical History:  Diagnosis Date  . CHF (congestive heart failure) ([place])   . Hypertension   . Noncompliance with medication regimen     Patient Active Problem List   Diagnosis Date Noted  . Nonspecific serologic evidence of human immunodeficiency virus (HIV) 03/13/2019  . Acute exacerbation of CHF (congestive heart failure) ([place]) 03/10/2019  . Hypertensive urgency 03/10/2019  . Congestive heart failure ([place]) 03/10/2019  . SOB (shortness of breath) 03/10/2019  . CKD (chronic kidney disease) 03/10/2019  . Noncompliance with medication regimen 03/10/2019    Past Surgical History:  Procedure Laterality Date  . NO PAST SURGERIES          Home Medications    Prior to Admission medications   Medication Sig Start Date End Date Taking? Authorizing Provider  acetaminophen (TYLENOL) 500 MG tablet Take 500-1,500 mg by mouth daily as needed for mild pain or moderate pain.    [provider]  carvedilol (COREG) 25 MG  tablet Take 1 tablet (25 mg total) by mouth 2 (two) times daily with a meal for 30 days. 03/14/19 04/13/19  Johnson, Clanford L, MD  ferrous sulfate 325 (65 FE) MG tablet Take 1 tablet (325 mg total) by mouth daily with breakfast for 30 days. 03/14/19 04/13/19  Johnson, Clanford L, MD  furosemide (LASIX) 40 MG tablet Take 1 tablet (40 mg total) by mouth daily for 30 days. 03/14/19 04/13/19  Johnson, Clanford L, MD  hydrALAZINE (APRESOLINE) 25 MG tablet Take 1 tablet (25 mg total) by mouth every 8 (eight) hours for 30 days. 03/13/19 04/12/19  Johnson, Clanford L, MD  isosorbide mononitrate (IMDUR) 30 MG 24 hr tablet Take 1 tablet (30 mg total) by mouth daily for 30 days. 03/14/19 04/13/19  Johnson, Clanford L, MD  potassium chloride (K-DUR) 10 MEQ tablet Take 1 tablet (10 mEq total) by mouth daily for 30 days. 03/14/19 04/13/19  Murlean Iba, MD    Family History Family History  Problem Relation Age of Onset  . Cancer Mother   . Diabetes Mellitus II Mother   . Cancer Father     Social History Social History   Tobacco Use  . Smoking status: Former Smoker    Packs/day: 1.00    Types: Cigarettes    Quit date: 11/23/2018    Years since quitting: 0.8  . Smokeless tobacco: Never  Used  Substance Use Topics  . Alcohol use: Not Currently    Comment: quit alcohol 4 months ago   . Drug use: Not Currently    Types: Marijuana    Comment: Quit about a year ago     Allergies   Neosporin [neomycin-bacitracin zn-polymyx]   Review of Systems Review of Systems  Constitutional: Negative for chills and fever.  HENT: Negative for rhinorrhea and sore throat.   Eyes: Negative for visual disturbance.  Respiratory: Positive for shortness of breath. Negative for cough.   Cardiovascular: Positive for chest pain. Negative for leg swelling.  Gastrointestinal: Negative for abdominal pain, diarrhea, nausea and vomiting.  Genitourinary: Negative for dysuria.  Musculoskeletal: Positive for neck pain. Negative for back  pain.  Skin: Negative for rash.  Neurological: Negative for dizziness, light-headedness and headaches.  Hematological: Does not bruise/bleed easily.  Psychiatric/Behavioral: Negative for confusion.     Physical Exam Updated Vital Signs BP (!) 161/109   Pulse 94   Temp 100 F (37.8 C) (Oral)   Resp 18   Ht 1.829 m (6')   Wt 76.7 kg   SpO2 97%   BMI 22.92 kg/m   Physical Exam Vitals signs and nursing note reviewed.  Constitutional:      Appearance: Normal appearance. He is well-developed.  HENT:     Head: Normocephalic and atraumatic.  Eyes:     Extraocular Movements: Extraocular movements intact.     Conjunctiva/sclera: Conjunctivae normal.     Pupils: Pupils are equal, round, and reactive to light.  Neck:     Musculoskeletal: Normal range of motion and neck supple. No neck rigidity.  Cardiovascular:     Rate and Rhythm: Normal rate and regular rhythm.     Heart sounds: No murmur.  Pulmonary:     Effort: Pulmonary effort is normal. No respiratory distress.     Breath sounds: Normal breath sounds.  Abdominal:     Palpations: Abdomen is soft.     Tenderness: There is no abdominal tenderness.  Musculoskeletal:        General: No swelling.  Skin:    General: Skin is warm and dry.  Neurological:     General: No focal deficit present.     Mental Status: He is alert and oriented to person, place, and time.      ED Treatments / Results  Labs (all labs ordered are listed, but only abnormal results are displayed) Labs Reviewed  BASIC METABOLIC PANEL - Abnormal; Notable for the following components:      Result Value   Sodium 134 (*)    Calcium 8.8 (*)    All other components within normal limits  CBC - Abnormal; Notable for the following components:   RBC 4.16 (*)    Hemoglobin 11.7 (*)    HCT 36.2 (*)    All other components within normal limits  BRAIN NATRIURETIC PEPTIDE - Abnormal; Notable for the following components:   B Natriuretic Peptide 589.0 (*)     All other components within normal limits  TROPONIN I (HIGH SENSITIVITY) - Abnormal; Notable for the following components:   Troponin I (High Sensitivity) 50 (*)    All other components within normal limits  TROPONIN I (HIGH SENSITIVITY) - Abnormal; Notable for the following components:   Troponin I (High Sensitivity) 54 (*)    All other components within normal limits  TROPONIN I (HIGH SENSITIVITY)    EKG EKG Interpretation  Date/Time:  Sunday September 17 2019 12:52:59 EST Ventricular  Rate:  104 PR Interval:  162 QRS Duration: 98 QT Interval:  386 QTC Calculation: 507 R Axis:   2 Text Interpretation: Sinus tachycardia Biatrial enlargement ST & T wave abnormality, consider inferior ischemia ST & T wave abnormality, consider anterolateral ischemia Abnormal ECG new changes Confirmed by Fredia Sorrow [phone]) on 09/17/2019 3:20:01 PM   Radiology Dg Chest 2 View  Result Date: 09/17/2019 CLINICAL DATA:  Chest pain EXAM: CHEST - 2 VIEW COMPARISON:  03/10/2019 FINDINGS: Cardiomegaly. No frank interstitial edema. No pleural effusion or pneumothorax. Nodular opacity overlying the right mid lung is new from the prior but favors a nipple shadow. Visualized osseous structures are within normal limits. IMPRESSION: No evidence of acute cardiopulmonary disease. Electronically Signed   By: Julian Hy M.D.   On: 09/17/2019 13:34   Ct Angio Chest Pe W/cm &/or Wo Cm  Result Date: 09/17/2019 CLINICAL DATA:  Recent diagnosis of CHF, lack of follow-up with cardiology, EXAM: CT ANGIOGRAPHY CHEST WITH CONTRAST TECHNIQUE: Multidetector CT imaging of the chest was performed using the standard protocol during bolus administration of intravenous contrast. Multiplanar CT image reconstructions and MIPs were obtained to evaluate the vascular anatomy. CONTRAST:  174mL OMNIPAQUE IOHEXOL 350 MG/ML SOLN COMPARISON:  Radiograph 09/17/2019 FINDINGS: Cardiovascular: Satisfactory opacification of the pulmonary  arteries to the segmental level no pulmonary arterial filling defects are identified. Central pulmonary arteries are at the upper limits of normal for caliber. There is moderate cardiomegaly with biatrial enlargement and suggestion of left ventricular hypertrophy. Moderate volume pericardial effusion of simple attenuation (10 HU). Normal caliber thoracic aorta. Normal 3 vessel branching of the arch. Mediastinum/Nodes: Few low-attenuation mediastinal and hilar nodes are present including: 12 mm AP window lymph node (7/39), 11 mm pretracheal lymph node (7/41), 10 mm right hilar lymph node (7/46). Diffusely increased attenuation of the mediastinum noted as well. No acute abnormality of the trachea. Mild circumferential esophageal wall thickening. Lungs/Pleura: Small left pleural effusion with adjacent passive atelectatic changes. There are patchy areas of ground-glass opacity with mild septal and peribronchovascular thickening. No pneumothorax. No focal consolidative process. Upper Abdomen: No acute abnormalities present in the visualized portions of the upper abdomen. Musculoskeletal: No acute osseous abnormality or suspicious osseous lesion. Review of the MIP images confirms the above findings. IMPRESSION: 1. No evidence of pulmonary embolism. 2. Moderate cardiomegaly with biatrial enlargement and suggestion of left ventricular hypertrophy. 3. Moderate volume pericardial effusion of simple attenuation. 4. Patchy areas of ground-glass opacity with mild septal and peribronchovascular thickening, favored to represent mild pulmonary edema. 5. Small left pleural effusion with adjacent passive atelectatic changes. 6. Few low-attenuation mediastinal and hilar nodes, nonspecific, but favor reactive. 7. Mild circumferential esophageal wall thickening, nonspecific, but can be seen with esophagitis. Electronically Signed   By: Lovena Le M.D.   On: 09/17/2019 17:54    Procedures Procedures (including critical care time)   Medications Ordered in ED Medications  hydrALAZINE (APRESOLINE) tablet 25 mg (25 mg Oral Given 09/17/19 1555)  0.9 %  sodium chloride infusion ( Intravenous New Bag/Given 09/17/19 1554)  aspirin chewable tablet 324 mg (324 mg Oral Given 09/17/19 1554)  furosemide (LASIX) injection 40 mg (40 mg Intravenous Given 09/17/19 1557)  iohexol (OMNIPAQUE) 350 MG/ML injection 100 mL (100 mLs Intravenous Contrast Given 09/17/19 1736)     Initial Impression / Assessment and Plan / ED Course  I have reviewed the triage vital signs and the nursing notes.  Pertinent labs & imaging results that were available during my care of  the patient were reviewed by me and considered in my medical decision making (see chart for details).       Patient's blood pressure was significantly elevated.  Patient given 25 mg of hydralazine p.o.  IV not available.  Patient also given 40 mill grams of Lasix IV.  Patient states that he feels more comfortable as far as the breathing standpoint.  Also was given aspirin for the chest pain.  Troponins both have been in the 50 range.  Kidney function is good here today.  CT chest done shows no pulmonary embolus but shows evidence of pericardial effusion some mild pulmonary edema.  Feel the patient would benefit for admission for complete cardiac rule out as well as blood pressure control.  Will discuss with hospitalist.  Most recent blood pressure check taken was 168/127.  Discussed with the fellow on-call for cardiology at [facility].  He felt that his symptoms were agreed the symptoms were not consistent with unstable angina.  Felt that the elevated troponins were secondary to chronic CHF and chronic hypertension.  He does agree with the admission for blood pressure control.  Cardiology can consult on him here in the morning.  Patient is now pain-free.  Hospitalist will admit.   Final Clinical Impressions(s) / ED Diagnoses   Final diagnoses:  Hypertensive urgency  Atypical chest pain   SOB (shortness of breath)    ED Discharge Orders    None       Fredia Sorrow, MD 09/17/19 1813    Fredia Sorrow, MD 09/17/19 2108

## 2019-09-17 NOTE — ED Notes (Signed)
Pt sent from CT after IV needed to be 20 g   IV placed, call to CT   Pt to be moved to rm 10 after CT/angio

## 2019-09-17 NOTE — H&P (Addendum)
History and Physical    Richard Cox YDX:[id number] DOB: Apr 03, 1981 DOA: 09/17/2019  PCP: Patient, No Pcp Per  Patient coming from: Home  I have personally briefly reviewed patient's old medical records in [place]  Chief Complaint: Chest pain  HPI: Richard Cox is a 38 y.o. male with medical history significant of congestive heart failure, hypertension who presented to the ER with chest pain, uncontrolled hypertension.  Patient has a known history of hypertension and is supposed to be on Coreg, hydralazine, Imdur, Lasix at home but has not been taking for the last 2 months as he ran out of medications and did not seek any care.  Does not have a primary care doctor.  He says has been having intermittent chest pain on the left side of the chest as well as in the substernal region going up to the neck on and off for the past few days.  He also reports shortness of breath with exertion as well as orthopnea.  Denies any fever, cough, chills, nausea, vomiting, abdominal pain, palpitations, leg swelling.  He says he came in to the hospital to get refills on his medications and for evaluation. ED Course:  Vital Signs reviewed on presentation, significant for temperature 99.8, initial blood pressure 183/114, heart rate 94, saturation 90% on room air. Labs reviewed, significant for sodium 134, potassium 3.6, BUN 13, creatinine 1.0, BNP 589, troponins noted to be 50, 54, 57.  WBC count 5.1, hemoglobin 11.7, hematocrit 36, platelets 204, SARS-CoV-2 RT-PCR has been sent. Imaging personally Reviewed, CT angiogram of the chest shows no evidence of pulmonary embolism. EKG personally reviewed, shows sinus rhythm, ST depressions and T wave inversions in anterolateral leads.  Review of Systems: As per HPI otherwise 10 point review of systems negative.  All other review of systems is negative except the ones noted above in the HPI.  Past Medical History:  Diagnosis Date   CHF (congestive heart failure) (HCC)     Hypertension    Noncompliance with medication regimen     Past Surgical History:  Procedure Laterality Date   NO PAST SURGERIES       reports that he quit smoking about 9 months ago. His smoking use included cigarettes. He smoked 1.00 pack per day. He has never used smokeless tobacco. He reports previous alcohol use. He reports previous drug use. Drug: Marijuana.  Allergies  Allergen Reactions   Neosporin [Neomycin-Bacitracin Zn-Polymyx] Rash    Family History  Problem Relation Age of Onset   Cancer Mother    Diabetes Mellitus II Mother    Cancer Father    Family history reviewed, noted as above, not pertinent to current presentation.   Prior to Admission medications   Medication Sig Start Date End Date Taking? Authorizing Provider  acetaminophen (TYLENOL) 500 MG tablet Take 500-1,500 mg by mouth daily as needed for mild pain or moderate pain.   Yes [provider]  carvedilol (COREG) 25 MG tablet Take 1 tablet (25 mg total) by mouth 2 (two) times daily with a meal for 30 days. Patient not taking: Reported on 09/17/2019 03/14/19 04/13/19  Murlean Iba, MD  ferrous sulfate 325 (65 FE) MG tablet Take 1 tablet (325 mg total) by mouth daily with breakfast for 30 days. Patient not taking: Reported on 09/17/2019 03/14/19 04/13/19  Murlean Iba, MD  furosemide (LASIX) 40 MG tablet Take 1 tablet (40 mg total) by mouth daily for 30 days. Patient not taking: Reported on 09/17/2019 03/14/19 04/13/19  Johnson, Clanford L, MD  hydrALAZINE (APRESOLINE) 25 MG tablet Take 1 tablet (25 mg total) by mouth every 8 (eight) hours for 30 days. Patient not taking: Reported on 09/17/2019 03/13/19 04/12/19  Murlean Iba, MD  isosorbide mononitrate (IMDUR) 30 MG 24 hr tablet Take 1 tablet (30 mg total) by mouth daily for 30 days. Patient not taking: Reported on 09/17/2019 03/14/19 04/13/19  Irwin Brakeman L, MD  potassium chloride (K-DUR) 10 MEQ tablet Take 1 tablet (10 mEq total) by  mouth daily for 30 days. Patient not taking: Reported on 09/17/2019 03/14/19 04/13/19  Murlean Iba, MD    Physical Exam: Vitals:   09/17/19 2015 09/17/19 2030 09/17/19 2100 09/17/19 2130  BP:  (!) 162/107 (!) 163/105 (!) 157/92  Pulse:  96 92 91  Resp: '20 20 18 19  ' Temp:      TempSrc:      SpO2:  100% 100% 99%  Weight:      Height:        Constitutional: NAD, calm, comfortable Vitals:   09/17/19 2015 09/17/19 2030 09/17/19 2100 09/17/19 2130  BP:  (!) 162/107 (!) 163/105 (!) 157/92  Pulse:  96 92 91  Resp: '20 20 18 19  ' Temp:      TempSrc:      SpO2:  100% 100% 99%  Weight:      Height:       Eyes: PERRL, lids and conjunctivae normal ENMT: Mucous membranes are moist. Posterior pharynx clear of any exudate or lesions.Normal dentition.  Neck: normal, supple, no masses, no thyromegaly Respiratory: clear to auscultation bilaterally, no wheezing, no crackles. Normal respiratory effort. No accessory muscle use.  Cardiovascular: Regular rate and rhythm, no murmurs / rubs / gallops. No extremity edema. 2+ pedal pulses. No carotid bruits.  Abdomen: no tenderness, no masses palpated. No hepatosplenomegaly. Bowel sounds positive.  Musculoskeletal: no clubbing / cyanosis. No joint deformity upper and lower extremities. Good ROM, no contractures. Normal muscle tone.  Skin: no rashes, lesions, ulcers. No induration Neurologic: CN 2-12 grossly intact. Sensation intact, DTR normal. Strength 5/5 in all 4.  Psychiatric: Normal judgment and insight. Alert and oriented x 3. Normal mood.    Decubitus Ulcers: Not present on admission Catheters and tubes: None   Labs on Admission: I have personally reviewed following labs and imaging studies  CBC: Recent Labs  Lab 09/17/19 1347  WBC 5.1  HGB 11.7*  HCT 36.2*  MCV 87.0  PLT 446   Basic Metabolic Panel: Recent Labs  Lab 09/17/19 1347  NA 134*  K 3.6  CL 105  CO2 22  GLUCOSE 89  BUN 13  CREATININE 1.00  CALCIUM 8.8*    GFR: Estimated Creatinine Clearance: 110.8 mL/min (by C-G formula based on SCr of 1 mg/dL). Liver Function Tests: No results for input(s): AST, ALT, ALKPHOS, BILITOT, PROT, ALBUMIN in the last 168 hours. No results for input(s): LIPASE, AMYLASE in the last 168 hours. No results for input(s): AMMONIA in the last 168 hours. Coagulation Profile: No results for input(s): INR, PROTIME in the last 168 hours. Cardiac Enzymes: No results for input(s): CKTOTAL, CKMB, CKMBINDEX, TROPONINI in the last 168 hours. BNP (last 3 results) No results for input(s): PROBNP in the last 8760 hours. HbA1C: No results for input(s): HGBA1C in the last 72 hours. CBG: No results for input(s): GLUCAP in the last 168 hours. Lipid Profile: No results for input(s): CHOL, HDL, LDLCALC, TRIG, CHOLHDL, LDLDIRECT in the last 72 hours. Thyroid Function  Tests: No results for input(s): TSH, T4TOTAL, FREET4, T3FREE, THYROIDAB in the last 72 hours. Anemia Panel: No results for input(s): VITAMINB12, FOLATE, FERRITIN, TIBC, IRON, RETICCTPCT in the last 72 hours. Urine analysis:    Component Value Date/Time   COLORURINE YELLOW 03/10/2019 [place] 03/10/2019 1418   LABSPEC 1.011 03/10/2019 1418   PHURINE 7.0 03/10/2019 1418   GLUCOSEU NEGATIVE 03/10/2019 1418   HGBUR SMALL (A) 03/10/2019 1418   BILIRUBINUR NEGATIVE 03/10/2019 1418   KETONESUR NEGATIVE 03/10/2019 [place] 03/10/2019 1418   NITRITE NEGATIVE 03/10/2019 [place] 03/10/2019 1418    Radiological Exams on Admission: Dg Chest 2 View  Result Date: 09/17/2019 CLINICAL DATA:  Chest pain EXAM: CHEST - 2 VIEW COMPARISON:  03/10/2019 FINDINGS: Cardiomegaly. No frank interstitial edema. No pleural effusion or pneumothorax. Nodular opacity overlying the right mid lung is new from the prior but favors a nipple shadow. Visualized osseous structures are within normal limits. IMPRESSION: No evidence of acute  cardiopulmonary disease. Electronically Signed   By: Julian Hy M.D.   On: 09/17/2019 13:34   Ct Angio Chest Pe W/cm &/or Wo Cm  Result Date: 09/17/2019 CLINICAL DATA:  Recent diagnosis of CHF, lack of follow-up with cardiology, EXAM: CT ANGIOGRAPHY CHEST WITH CONTRAST TECHNIQUE: Multidetector CT imaging of the chest was performed using the standard protocol during bolus administration of intravenous contrast. Multiplanar CT image reconstructions and MIPs were obtained to evaluate the vascular anatomy. CONTRAST:  122m OMNIPAQUE IOHEXOL 350 MG/ML SOLN COMPARISON:  Radiograph 09/17/2019 FINDINGS: Cardiovascular: Satisfactory opacification of the pulmonary arteries to the segmental level no pulmonary arterial filling defects are identified. Central pulmonary arteries are at the upper limits of normal for caliber. There is moderate cardiomegaly with biatrial enlargement and suggestion of left ventricular hypertrophy. Moderate volume pericardial effusion of simple attenuation (10 HU). Normal caliber thoracic aorta. Normal 3 vessel branching of the arch. Mediastinum/Nodes: Few low-attenuation mediastinal and hilar nodes are present including: 12 mm AP window lymph node (7/39), 11 mm pretracheal lymph node (7/41), 10 mm right hilar lymph node (7/46). Diffusely increased attenuation of the mediastinum noted as well. No acute abnormality of the trachea. Mild circumferential esophageal wall thickening. Lungs/Pleura: Small left pleural effusion with adjacent passive atelectatic changes. There are patchy areas of ground-glass opacity with mild septal and peribronchovascular thickening. No pneumothorax. No focal consolidative process. Upper Abdomen: No acute abnormalities present in the visualized portions of the upper abdomen. Musculoskeletal: No acute osseous abnormality or suspicious osseous lesion. Review of the MIP images confirms the above findings. IMPRESSION: 1. No evidence of pulmonary embolism. 2. Moderate  cardiomegaly with biatrial enlargement and suggestion of left ventricular hypertrophy. 3. Moderate volume pericardial effusion of simple attenuation. 4. Patchy areas of ground-glass opacity with mild septal and peribronchovascular thickening, favored to represent mild pulmonary edema. 5. Small left pleural effusion with adjacent passive atelectatic changes. 6. Few low-attenuation mediastinal and hilar nodes, nonspecific, but favor reactive. 7. Mild circumferential esophageal wall thickening, nonspecific, but can be seen with esophagitis. Electronically Signed   By: PLovena LeM.D.   On: 09/17/2019 17:54      Assessment/Plan Active Problems:   Hypertensive urgency   Congestive heart failure (HCC)     Principal Problem: Chest pain, concerning for angina Patient presented with mild left-sided chest pain radiating to neck.  Had mild associated shortness of breath, orthopnea.  EKG shows mild ST depressions and T wave inversions.  Troponins noted to be slightly  elevated.  Troponin elevation possibly secondary to CHF, uncontrolled hypertension however given presentation with chest pain, would need to rule out cardiac ischemia.  Case was discussed by the ED physician with cardiology fellow on-call at [facility] at my request as patient had elevated troponins as well as persistent pericardial effusion.  I was concerned patient may need cardiac cath and evaluation of effusion.  The cardiac fellow did not feel the patient required transfer at this time and patient has been referred for admission to hospitalist service at [facility]. Plan: Telemetry monitoring Serial troponins Repeat echocardiogram in a.m. Cardiology consult and consideration for stress test versus cardiac cath.  Consult been placed with Dr. Harl Bowie for the morning.  Will need to be called in a.m. Has been placed on aspirin Lipid panel, A1c in a.m.  Other Active Problems: Mild acute on Congestive heart failure with reduced ejection  fraction: Last echocardiogram in May 2020 showed EF of 40 to 45%, severe LV wall thickness with diffuse hypokinesis, mild diastolic dysfunction.  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 Patient has mild shortness of breath and orthopnea on presentation.  CT of the chest does show mild groundglass opacities concerning for possible pulmonary edema.  No lower extremity edema. Plan: We will place on p.o. Lasix at home dose and monitor input output, renal function, daily weights.  Pericardial effusion: Patient had a small to moderate pericardial effusion on prior echocardiogram in July.  Etiology was unclear.  Repeat CT angio today still shows a moderate effusion.  No signs of cardiac tamponade. Patient does not have any fever, recent illness, flulike symptoms.  Pericardial effusion seems to be persistent but unchanged from May.  No pericardial rub at auscultation.  Does not seem to suggest viral pericarditis. Will send ESR, CRP, ANA, HIV.  Essential hypertension, uncontrolled presenting with hypertensive urgency: Patient ran out of his medications and has not been taking them.  Was supposed to be on isosorbide, carvedilol, hydralazine at home. We will restart all medications with close titration of blood pressures with parameters.  Severe vitamin D deficiency: 25 hydroxy vitamin D level in May 2020 was 10.1. We will give 1 dose of ergocalciferol and place on daily cholecalciferol   DVT prophylaxis: Lovenox Code Status:  Full code Family Communication: N/A  Disposition Plan: Admitted as inpatient due to hypertensive urgency, elevated troponins.  Will need cardiology eval and possible assessment for stress test versus cardiac cath Consults called: N/A Admission status: Inpatient   Lynetta Mare MD [facility]  If 7PM-7AM, please contact night-coverage   09/17/2019, 10:01 PM

## 2019-09-17 NOTE — ED Notes (Signed)
HB in to assess 

## 2019-09-17 NOTE — ED Notes (Signed)
To CT

## 2019-09-17 NOTE — ED Triage Notes (Signed)
Pt states he was dx with CHF back in April. Has not been regularly following up with his cardiologist and has been off of his medications for a few months. Pt is not taking any of his medications at this time. Pt states he has a little SHOB when laying flat, and intermittent left side neck pain that radiates to his shoulder, not currently present.

## 2019-09-17 NOTE — ED Notes (Signed)
Pt reports dx with CHF in May  Doing well until bills came in per his report Trouble with med/physician cost  No meds in awhile  Here for eval

## 2019-09-17 NOTE — ED Notes (Signed)
Pt moved to rm 20 for monitoring   HB in to assess

## 2019-09-18 ENCOUNTER — Inpatient Hospital Stay (HOSPITAL_COMMUNITY): Payer: Self-pay

## 2019-09-18 DIAGNOSIS — I5023 Acute on chronic systolic (congestive) heart failure: Secondary | ICD-10-CM

## 2019-09-18 DIAGNOSIS — F1910 Other psychoactive substance abuse, uncomplicated: Secondary | ICD-10-CM | POA: Diagnosis present

## 2019-09-18 DIAGNOSIS — F1010 Alcohol abuse, uncomplicated: Secondary | ICD-10-CM | POA: Diagnosis present

## 2019-09-18 DIAGNOSIS — B20 Human immunodeficiency virus [HIV] disease: Secondary | ICD-10-CM | POA: Diagnosis present

## 2019-09-18 DIAGNOSIS — I5042 Chronic combined systolic (congestive) and diastolic (congestive) heart failure: Secondary | ICD-10-CM | POA: Diagnosis present

## 2019-09-18 DIAGNOSIS — I5043 Acute on chronic combined systolic (congestive) and diastolic (congestive) heart failure: Secondary | ICD-10-CM

## 2019-09-18 DIAGNOSIS — Z720 Tobacco use: Secondary | ICD-10-CM | POA: Diagnosis present

## 2019-09-18 DIAGNOSIS — I313 Pericardial effusion (noninflammatory): Secondary | ICD-10-CM

## 2019-09-18 LAB — HEMOGLOBIN A1C
Hgb A1c MFr Bld: 5.5 % (ref 4.8–5.6)
Mean Plasma Glucose: 111.15 mg/dL

## 2019-09-18 LAB — CBC
HCT: 33.4 % — ABNORMAL LOW (ref 39.0–52.0)
Hemoglobin: 11.1 g/dL — ABNORMAL LOW (ref 13.0–17.0)
MCH: 28.5 pg (ref 26.0–34.0)
MCHC: 33.2 g/dL (ref 30.0–36.0)
MCV: 85.9 fL (ref 80.0–100.0)
Platelets: 202 10*3/uL (ref 150–400)
RBC: 3.89 MIL/uL — ABNORMAL LOW (ref 4.22–5.81)
RDW: 13.6 % (ref 11.5–15.5)
WBC: 4.4 10*3/uL (ref 4.0–10.5)
nRBC: 0 % (ref 0.0–0.2)

## 2019-09-18 LAB — BASIC METABOLIC PANEL
Anion gap: 7 (ref 5–15)
BUN: 18 mg/dL (ref 6–20)
CO2: 23 mmol/L (ref 22–32)
Calcium: 8.8 mg/dL — ABNORMAL LOW (ref 8.9–10.3)
Chloride: 104 mmol/L (ref 98–111)
Creatinine, Ser: 1.23 mg/dL (ref 0.61–1.24)
GFR calc Af Amer: >60 mL/min (ref >60)
GFR calc non Af Amer: >60 mL/min (ref >60)
Glucose, Bld: 103 mg/dL — ABNORMAL HIGH (ref 70–99)
Potassium: 3.6 mmol/L (ref 3.5–5.1)
Sodium: 134 mmol/L — ABNORMAL LOW (ref 135–145)

## 2019-09-18 LAB — ECHOCARDIOGRAM COMPLETE
Height: 73 in
Weight: 2659.63 oz

## 2019-09-18 LAB — LIPID PANEL
Cholesterol: 142 mg/dL (ref 0–200)
HDL: 16 mg/dL — ABNORMAL LOW (ref >40)
LDL Cholesterol: 57 mg/dL (ref 0–99)
Total CHOL/HDL Ratio: 8.9 RATIO
Triglycerides: 346 mg/dL — ABNORMAL HIGH (ref <150)
VLDL: 69 mg/dL — ABNORMAL HIGH (ref 0–40)

## 2019-09-18 LAB — C-REACTIVE PROTEIN: CRP: 9.6 mg/dL — ABNORMAL HIGH (ref <1.0)

## 2019-09-18 LAB — SARS CORONAVIRUS 2 (TAT 6-24 HRS): SARS Coronavirus 2: NEGATIVE

## 2019-09-18 LAB — HIV ANTIBODY (ROUTINE TESTING W REFLEX): HIV Screen 4th Generation wRfx: REACTIVE — AB

## 2019-09-18 LAB — SEDIMENTATION RATE: Sed Rate: 92 mm/hr — ABNORMAL HIGH (ref 0–16)

## 2019-09-18 MED ORDER — CARVEDILOL 3.125 MG PO TABS
6.2500 mg | ORAL_TABLET | Freq: Two times a day (BID) | ORAL | Status: DC
  Administered 2019-09-18 – 2019-09-19 (×2): 6.25 mg via ORAL
  Filled 2019-09-18 (×2): qty 2

## 2019-09-18 MED ORDER — FUROSEMIDE 10 MG/ML IJ SOLN
40.0000 mg | Freq: Every day | INTRAMUSCULAR | Status: DC
  Administered 2019-09-18 – 2019-09-19 (×2): 40 mg via INTRAVENOUS
  Filled 2019-09-18 (×2): qty 4

## 2019-09-18 MED ORDER — VITAMIN D (ERGOCALCIFEROL) 1.25 MG (50000 UNIT) PO CAPS
50000.0000 [IU] | ORAL_CAPSULE | Freq: Once | ORAL | Status: AC
  Administered 2019-09-18: 50000 [IU] via ORAL
  Filled 2019-09-18 (×2): qty 1

## 2019-09-18 MED ORDER — VITAMIN D 25 MCG (1000 UNIT) PO TABS
2000.0000 [IU] | ORAL_TABLET | Freq: Every day | ORAL | Status: DC
  Administered 2019-09-18 – 2019-09-19 (×2): 2000 [IU] via ORAL
  Filled 2019-09-18 (×2): qty 2

## 2019-09-18 MED ORDER — ISOSORBIDE MONONITRATE ER 30 MG PO TB24
15.0000 mg | ORAL_TABLET | Freq: Every day | ORAL | Status: DC
  Administered 2019-09-19: 08:00:00 15 mg via ORAL
  Filled 2019-09-18: qty 1

## 2019-09-18 MED ORDER — FUROSEMIDE 10 MG/ML IJ SOLN
40.0000 mg | Freq: Two times a day (BID) | INTRAMUSCULAR | Status: DC
  Administered 2019-09-18: 08:00:00 40 mg via INTRAVENOUS
  Filled 2019-09-18: qty 4

## 2019-09-18 NOTE — Progress Notes (Signed)
  Echocardiogram 2D Echocardiogram has been performed.  Richard Cox 09/18/2019, 10:11 AM

## 2019-09-18 NOTE — Progress Notes (Signed)
Patient Demographics:    Richard Cox, is a 38 y.o. male, DOB - May 05, 1981, EKB:[id number]  Admit date - 09/17/2019   Admitting Physician Lynetta Mare, MD  Outpatient Primary MD for the patient is Patient, No Pcp Per  LOS - 1   Chief Complaint  Patient presents with   Chest Pain        Subjective:    Richard Cox today has no fevers, no emesis,  No further chest pain,   -Shortness of breath persist Dyspnea on exertion persist -Had some orthostatic dizziness  Assessment  & Plan :    Principal Problem:   Acute on chronic HFrEF (heart failure with reduced ejection fraction) -combined diastolic and systolic dysfunction CHF Active Problems:   Hypertensive urgency   Polysubstance abuse -cocaine use in remission   Alcohol abuse   Congestive heart failure (HCC)   HIV (human immunodeficiency virus infection) ([place])   Tobacco abuse  Brief Summary -38 year old with past medical history relevant for polysubstance abuse including prior history of cocaine/THC, alcohol and tobacco abuse as well as history of HFrEF with  combined systolic/diastolic HF in setting of medication noncompliance and severe HTN--readmitted on 09/17/2019 with worsening shortness of breath and edema   A/p  1)Acute on chronic combined systolic/Diastolic HF in setting of medication noncompliance and severe HTN----cardiology consult appreciated,  -Last known EF of 40 to 45%, repeat echo pending -Prior work-up suggested possible hypertensive cardiomyopathy versus hypertrophic cardiomyopathy with the need to rule out possible infiltrative process like amyloidosis--- patient will need cardiac MRI as outpatient once Payer source can be secured -Continues to have dyspnea on exertion, dyspnea at rest and orthopnea appears to be resolving -Continue Coreg, hydralazine/Imdur combo -IV Lasix 40 mg daily, daily weights and fluid input and output  monitoring  2)Possible Pericardial Effusion/Possible Pericarditis--repeat echo pending -Cardiology input appreciated -ESR is 92, CRP is 9.6  3)Hypertensive urgency/uncontrolled HTN--patient is notoriously noncompliant with antihypertensives- Need to titrate Coreg, hydralazine/Imdur for better BP control  4)HIV Positive -- Pt reminded to follow up with ID as outpatient  5)Polysubstance Abuse-patient continues to use alcohol and tobacco, indicates is no longer uses cocaine -Complete abstinence from tobacco and alcohol advised  Disposition/Need for in-Hospital Stay- patient unable to be discharged at this time due to --- acute on chronic combined CHF requiring IV diuresis further BP control  Code Status : Full code  Family Communication:   NA (patient is alert, awake and coherent)   Disposition Plan  : home in am   Consults  :  cardiology  DVT Prophylaxis  :  Lovenox -  - SCDs   Lab Results  Component Value Date   PLT 202 09/18/2019    Inpatient Medications  Scheduled Meds:  aspirin EC  81 mg Oral Daily   carvedilol  6.25 mg Oral BID WC   cholecalciferol  2,000 Units Oral Daily   enoxaparin (LOVENOX) injection  40 mg Subcutaneous Q24H   ferrous sulfate  325 mg Oral Q breakfast   hydrALAZINE  25  mg Oral Q8H   [START ON 09/19/2019] isosorbide mononitrate  15 mg Oral Daily   potassium chloride  10 mEq Oral Daily   sodium chloride flush  3 mL Intravenous Q12H   Continuous Infusions:  sodium chloride Stopped (09/18/19 0200)   sodium chloride     PRN Meds:.sodium chloride, acetaminophen **OR** acetaminophen, bisacodyl, morphine injection, nitroGLYCERIN, ondansetron **OR** ondansetron (ZOFRAN) IV, polyethylene glycol, sodium chloride flush, traMADol    Anti-infectives (From admission, onward)   None        Objective:   Vitals:   09/18/19 0130 09/18/19 0208 09/18/19 0546 09/18/19 0824  BP: (!) 171/119 (!) 163/122 (!) 169/119 (!) 165/123  Pulse: 95 95 98  (!) 101  Resp: (!) '26 18 18   ' Temp:  100.1 F (37.8 C) 99.6 F (37.6 C)   TempSrc:  Oral Oral   SpO2: 98% 99% 98%   Weight:  74.5 kg 75.4 kg   Height:  '6\' 1"'  (1.854 m)      Wt Readings from Last 3 Encounters:  09/18/19 75.4 kg  03/13/19 71.3 kg  04/12/18 77.3 kg     Intake/Output Summary (Last 24 hours) at 09/18/2019 1414 Last data filed at 09/18/2019 1143 Gross per 24 hour  Intake 581 ml  Output 2450 ml  Net -1869 ml     Physical Exam  Gen:- Awake Alert,  In no apparent distress  HEENT:- [place].AT, No sclera icterus Neck-Supple Neck,No JVD,.  Lungs-diminished breath sounds, faint bibasilar rales CV- S1, S2 normal, regular  Abd-  +ve B.Sounds, Abd Soft, No tenderness,    Extremity/Skin:- +ve edema, pedal pulses present  Psych-affect is appropriate, oriented x3 Neuro-no new focal deficits, no tremors   Data Review:   Micro Results Recent Results (from the past 240 hour(s))  SARS CORONAVIRUS 2 (TAT 6-24 HRS) Nasopharyngeal Nasopharyngeal Swab     Status: None   Collection Time: 09/17/19  6:56 PM   Specimen: Nasopharyngeal Swab  Result Value Ref Range Status   SARS Coronavirus 2 NEGATIVE NEGATIVE Final    Comment: (NOTE) SARS-CoV-2 target nucleic acids are NOT DETECTED. The SARS-CoV-2 RNA is generally detectable in upper and lower respiratory specimens during the acute phase of infection. Negative results do not preclude SARS-CoV-2 infection, do not rule out co-infections with other pathogens, and should not be used as the sole basis for treatment or other patient management decisions. Negative results must be combined with clinical observations, patient history, and epidemiological information. The expected result is Negative. Fact Sheet for Patients: SugarRoll.be Fact Sheet for Healthcare Providers: https://www.woods-mathews.com/ This test is not yet approved or cleared by the [place] FDA and  has been authorized  for detection and/or diagnosis of SARS-CoV-2 by FDA under an Emergency Use Authorization (EUA). This EUA will remain  in effect (meaning this test can be used) for the duration of the COVID-19 declaration under Section 56 4(b)(1) of the Act, 21 U.S.C. section 360bbb-3(b)(1), unless the authorization is terminated or revoked sooner. Performed at [facility], [place] [address], [place], [place] [address]     Radiology Reports Dg Chest 2 View  Result Date: 09/17/2019 CLINICAL DATA:  Chest pain EXAM: CHEST - 2 VIEW COMPARISON:  03/10/2019 FINDINGS: Cardiomegaly. No frank interstitial edema. No pleural effusion or pneumothorax. Nodular opacity overlying the right mid lung is new from the prior but favors a nipple shadow. Visualized osseous structures are within normal limits. IMPRESSION: No evidence of acute cardiopulmonary disease. Electronically Signed   By: Bertis Ruddy  [place] Pink M.D.   On: 09/17/2019 13:34   Ct Angio Chest Pe W/cm &/or Wo Cm  Result Date: 09/17/2019 CLINICAL DATA:  Recent diagnosis of CHF, lack of follow-up with cardiology, EXAM: CT ANGIOGRAPHY CHEST WITH CONTRAST TECHNIQUE: Multidetector CT imaging of the chest was performed using the standard protocol during bolus administration of intravenous contrast. Multiplanar CT image reconstructions and MIPs were obtained to evaluate the vascular anatomy. CONTRAST:  141m OMNIPAQUE IOHEXOL 350 MG/ML SOLN COMPARISON:  Radiograph 09/17/2019 FINDINGS: Cardiovascular: Satisfactory opacification of the pulmonary arteries to the segmental level no pulmonary arterial filling defects are identified. Central pulmonary arteries are at the upper limits of normal for caliber. There is moderate cardiomegaly with biatrial enlargement and suggestion of left ventricular hypertrophy. Moderate volume pericardial effusion of simple attenuation (10 HU). Normal caliber thoracic aorta. Normal 3 vessel branching of the arch. Mediastinum/Nodes: Few low-attenuation  mediastinal and hilar nodes are present including: 12 mm AP window lymph node (7/39), 11 mm pretracheal lymph node (7/41), 10 mm right hilar lymph node (7/46). Diffusely increased attenuation of the mediastinum noted as well. No acute abnormality of the trachea. Mild circumferential esophageal wall thickening. Lungs/Pleura: Small left pleural effusion with adjacent passive atelectatic changes. There are patchy areas of ground-glass opacity with mild septal and peribronchovascular thickening. No pneumothorax. No focal consolidative process. Upper Abdomen: No acute abnormalities present in the visualized portions of the upper abdomen. Musculoskeletal: No acute osseous abnormality or suspicious osseous lesion. Review of the MIP images confirms the above findings. IMPRESSION: 1. No evidence of pulmonary embolism. 2. Moderate cardiomegaly with biatrial enlargement and suggestion of left ventricular hypertrophy. 3. Moderate volume pericardial effusion of simple attenuation. 4. Patchy areas of ground-glass opacity with mild septal and peribronchovascular thickening, favored to represent mild pulmonary edema. 5. Small left pleural effusion with adjacent passive atelectatic changes. 6. Few low-attenuation mediastinal and hilar nodes, nonspecific, but favor reactive. 7. Mild circumferential esophageal wall thickening, nonspecific, but can be seen with esophagitis. Electronically Signed   By: PLovena LeM.D.   On: 09/17/2019 17:54     CBC Recent Labs  Lab 09/17/19 1347 09/18/19 0036  WBC 5.1 4.4  HGB 11.7* 11.1*  HCT 36.2* 33.4*  PLT 204 202  MCV 87.0 85.9  MCH 28.1 28.5  MCHC 32.3 33.2  RDW 13.7 13.6    Chemistries  Recent Labs  Lab 09/17/19 1347 09/18/19 1026  NA 134* 134*  K 3.6 3.6  CL 105 104  CO2 22 23  GLUCOSE 89 103*  BUN 13 18  CREATININE 1.00 1.23  CALCIUM 8.8* 8.8*    ------------------------------------------------------------------------------------------------------------------ Recent Labs    09/18/19 0036  CHOL 142  HDL 16*  LDLCALC 57  TRIG 346*  CHOLHDL 8.9    Lab Results  Component Value Date   HGBA1C 5.5 09/18/2019   ------------------------------------------------------------------------------------------------------------------ No results for input(s): TSH, T4TOTAL, T3FREE, THYROIDAB in the last 72 hours.  Invalid input(s): FREET3 ------------------------------------------------------------------------------------------------------------------ No results for input(s): VITAMINB12, FOLATE, FERRITIN, TIBC, IRON, RETICCTPCT in the last 72 hours.  Coagulation profile No results for input(s): INR, PROTIME in the last 168 hours.  No results for input(s): DDIMER in the last 72 hours.  Cardiac Enzymes No results for input(s): CKMB, TROPONINI, MYOGLOBIN in the last 168 hours.  Invalid input(s): CK ------------------------------------------------------------------------------------------------------------------    Component Value Date/Time   BNP 589.0 (H) 09/17/2019 1348     CRoxan HockeyM.D on 09/18/2019 at 2:14 PM  Go to [email or web address] - for contact info  [facility] -  Office  [phone]

## 2019-09-18 NOTE — TOC Initial Note (Addendum)
Transition of Care [facility]) - Initial/Assessment Note    Patient Details  Name: Richard Cox MRN: [id number] Date of Birth: 16-Jan-1981  Transition of [facility]) CM/SW Contact:    Ihor Gully, LCSW Phone Number: 09/18/2019, 3:30 PM  Clinical Narrative:                 Patient was referred for Medication needs, and PCP. Patient set up with [facility] on 03/10/2019. He maintained scheduled appointment on 04/13/2019 but cancelled the appointment on 06/07/2019 and did not reschedule. Patient was also provided a [place] voucher on 03/10/2019. MATCH vouchers can be provided yearly typically. TOC consulted for access to meds and difficulty with medical bills. Email to Development worker, community made.  TOC will follow and address needs as allowable.   Expected Discharge Plan: Home/Self Care Barriers to Discharge: Continued Medical Work up   Patient Goals and CMS Choice        Expected Discharge Plan and Services Expected Discharge Plan: Home/Self Care                                              Prior Living Arrangements/Services     Patient language and need for interpreter reviewed:: Yes        Need for Family Participation in Patient Care: Yes (Comment) Care giver support system in place?: Yes (comment)   Criminal Activity/Legal Involvement Pertinent to Current Situation/Hospitalization: No - Comment as needed  Activities of Daily Living Home Assistive Devices/Equipment: None ADL Screening (condition at time of admission) Patient's cognitive ability adequate to safely complete daily activities?: Yes Is the patient deaf or have difficulty hearing?: No Does the patient have difficulty seeing, even when wearing glasses/contacts?: No Does the patient have difficulty concentrating, remembering, or making decisions?: No Patient able to express need for assistance with ADLs?: Yes Does the patient have difficulty dressing or bathing?: No Independently performs ADLs?:  Yes (appropriate for developmental age) Does the patient have difficulty walking or climbing stairs?: No Weakness of Legs: None Weakness of Arms/Hands: None  Permission Sought/Granted                  Emotional Assessment Appearance:: Appears stated age   Affect (typically observed): Unable to Assess Orientation: : Oriented to Self, Oriented to Place, Oriented to  Time, Oriented to Situation Alcohol / Substance Use: Not Applicable Psych Involvement: No (comment)  Admission diagnosis:  Precordial pain [R07.2] Acute pulmonary edema (HCC) [J81.0] SOB (shortness of breath) [R06.02] Hypertensive urgency [I16.0] Patient Active Problem List   Diagnosis Date Noted  . Acute on chronic HFrEF (heart failure with reduced ejection fraction) -combined diastolic and systolic dysfunction CHF [id number]  . HIV (human immunodeficiency virus infection) ([place]) 09/18/2019  . Polysubstance abuse -cocaine use in remission 09/18/2019  . Alcohol abuse 09/18/2019  . Tobacco abuse 09/18/2019  . Nonspecific serologic evidence of human immunodeficiency virus (HIV) 03/13/2019  . Acute exacerbation of CHF (congestive heart failure) ([place]) 03/10/2019  . Hypertensive urgency 03/10/2019  . Congestive heart failure ([place]) 03/10/2019  . SOB (shortness of breath) 03/10/2019  . Noncompliance with medication regimen 03/10/2019   PCP:  Patient, No Pcp Per Pharmacy:   [place], [place] - [id number] [place] [address] Phone: [phone] Fax: [phone]     Social Determinants of Health (SDOH) Interventions  Readmission Risk Interventions No flowsheet data found.

## 2019-09-18 NOTE — Consult Note (Addendum)
Cardiology Consultation:   Patient ID: Richard Cox MRN: [id number]; DOB: 04/16/1981  Admit date: 09/17/2019 Date of Consult: 09/18/2019  Primary Care Provider: Patient, No Pcp Per Primary Cardiologist: Rozann Lesches, MD   Primary Electrophysiologist:  None    Patient Profile:   Richard Cox is a 38 y.o. male with a hx of hypertension who is being seen today for the evaluation of hypertensive urgency and CHF at the request of Dr. Scherrie November.  History of Present Illness:   Richard Cox is a 38 year old male patient who we first saw 03/2019 when he was admitted with chest pain and hypertensive urgency.  2D echo showed EF of 40 to 45% and severely increased left ventricular wall thickness as well as diastolic dysfunction.  Left ventricle showed diffuse hypokinesis.  Although the possibility was consistent with hypertensive cardiomyopathy in light of reported noncompliance with medical therapy and uncontrolled hypertension the degree of LVH and associated atrial enlargement also suggest a hypertrophic cardiomyopathy and possibility of infiltrative process such as amyloidosis.  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  He had mild flat troponin elevation.  He was sent home on Lasix, Coreg, Imdur and hydralazine.  Plan was for outpatient MRI.  Patient comes in yesterday with recurrent chest pain, hypertensive urgency and had stopped his medication 2 months ago.  He never followed up post hospital. He takes his BP daily and has been high. Complains of orthopnea. Doesn't walk more than 1/4 mile daily. Social alcohol and cigarettes but not daily. Previous marijuana and cocaine but none in over a year.  Admission shows moderate pericardial effusion BNP 589, troponins flat at 50, 54 and 57. Patient never paid for insurance but has signed up now and will start in Jan. He'll need help bridging the gap for hospitalization, MD visits and meds.  Heart Pathway  Score:     Past Medical History:  Diagnosis Date  . CHF (congestive heart failure) ([place])   . Hypertension   . Noncompliance with medication regimen     Past Surgical History:  Procedure Laterality Date  . NO PAST SURGERIES       Home Medications:  Prior to Admission medications   Medication Sig Start Date End Date Taking? Authorizing Provider  acetaminophen (TYLENOL) 500 MG tablet Take 500-1,500 mg by mouth daily as needed for mild pain or moderate pain.   Yes [provider]  carvedilol (COREG) 25 MG tablet Take 1 tablet (25 mg total) by mouth 2 (two) times daily with a meal for 30 days. Patient not taking: Reported on 09/17/2019 03/14/19 04/13/19  Murlean Iba, MD  ferrous sulfate 325 (65 FE) MG tablet Take 1 tablet (325 mg total) by mouth daily with breakfast for 30 days. Patient not taking: Reported on 09/17/2019 03/14/19 04/13/19  Murlean Iba, MD  furosemide (LASIX) 40 MG tablet Take 1 tablet (40 mg total) by mouth daily for 30 days. Patient not taking: Reported on 09/17/2019 03/14/19 04/13/19  Murlean Iba, MD  hydrALAZINE (APRESOLINE) 25 MG tablet Take 1 tablet (25 mg total) by mouth every 8 (eight) hours for 30 days. Patient not taking: Reported on 09/17/2019 03/13/19 04/12/19  Murlean Iba, MD  isosorbide mononitrate (IMDUR) 30 MG 24 hr tablet Take 1 tablet (30 mg total) by mouth daily for 30 days. Patient not taking: Reported on 09/17/2019 03/14/19 04/13/19  Murlean Iba, MD  potassium chloride (K-DUR) 10 MEQ tablet Take  1 tablet (10 mEq total) by mouth daily for 30 days. Patient not taking: Reported on 09/17/2019 03/14/19 04/13/19  Murlean Iba, MD    Inpatient Medications: Scheduled Meds: . aspirin EC  81 mg Oral Daily  . carvedilol  12.5 mg Oral BID WC  . cholecalciferol  2,000 Units Oral Daily  . enoxaparin (LOVENOX) injection  40 mg Subcutaneous Q24H  . ferrous sulfate  325 mg Oral Q breakfast  . furosemide  40 mg Intravenous BID  .  hydrALAZINE  25 mg Oral Q8H  . isosorbide mononitrate  30 mg Oral Daily  . potassium chloride  10 mEq Oral Daily  . sodium chloride flush  3 mL Intravenous Q12H   Continuous Infusions: . sodium chloride Stopped (09/18/19 0200)  . sodium chloride     PRN Meds: sodium chloride, acetaminophen **OR** acetaminophen, bisacodyl, morphine injection, nitroGLYCERIN, ondansetron **OR** ondansetron (ZOFRAN) IV, polyethylene glycol, sodium chloride flush, traMADol  Allergies:    Allergies  Allergen Reactions  . Neosporin [Neomycin-Bacitracin Zn-Polymyx] Rash    Social History:   Social History   Socioeconomic History  . Marital status: Single    Spouse name: Not on file  . Number of children: Not on file  . Years of education: Not on file  . Highest education level: Not on file  Occupational History  . Not on file  Social Needs  . Financial resource strain: Not on file  . Food insecurity    Worry: Not on file    Inability: Not on file  . Transportation needs    Medical: Not on file    Non-medical: Not on file  Tobacco Use  . Smoking status: Former Smoker    Packs/day: 1.00    Types: Cigarettes    Quit date: 11/23/2018    Years since quitting: 0.8  . Smokeless tobacco: Never Used  Substance and Sexual Activity  . Alcohol use: Not Currently    Comment: quit alcohol 4 months ago   . Drug use: Not Currently    Types: Marijuana    Comment: Quit about a year ago  . Sexual activity: Not on file  Lifestyle  . Physical activity    Days per week: Not on file    Minutes per session: Not on file  . Stress: Not on file  Relationships  . Social Herbalist on phone: Not on file    Gets together: Not on file    Attends religious service: Not on file    Active member of club or organization: Not on file    Attends meetings of clubs or organizations: Not on file    Relationship status: Not on file  . Intimate partner violence    Fear of current or ex partner: Not on file     Emotionally abused: Not on file    Physically abused: Not on file    Forced sexual activity: Not on file  Other Topics Concern  . Not on file  Social History Narrative  . Not on file    Family History:     Family History  Problem Relation Age of Onset  . Cancer Mother   . Diabetes Mellitus II Mother   . Cancer Father      ROS:  Please see the history of present illness.  Review of Systems  Constitution: Negative.  HENT: Negative.   Cardiovascular: Positive for chest pain and orthopnea.  Respiratory: Positive for sleep disturbances due to breathing and sputum production.  Endocrine: Negative.   Hematologic/Lymphatic: Negative.   Musculoskeletal: Negative.   Gastrointestinal: Negative.   Genitourinary: Negative.   Neurological: Negative.    All other ROS reviewed and negative.     Physical Exam/Data:   Vitals:   09/18/19 0100 09/18/19 0130 09/18/19 0208 09/18/19 0546  BP: (!) 171/121 (!) 171/119 (!) 163/122 (!) 169/119  Pulse: 92 95 95 98  Resp: 18 (!) 26 18 18   Temp:   100.1 F (37.8 C) 99.6 F (37.6 C)  TempSrc:   Oral Oral  SpO2: 99% 98% 99% 98%  Weight:   74.5 kg 75.4 kg  Height:   6\' 1"  (1.854 m)     Intake/Output Summary (Last 24 hours) at 09/18/2019 0821 Last data filed at 09/18/2019 [id number] Gross per 24 hour  Intake 581 ml  Output 1050 ml  Net -469 ml   Last 3 Weights 09/18/2019 09/18/2019 09/17/2019  Weight (lbs) 166 lb 3.6 oz 164 lb 3.9 oz 169 lb  Weight (kg) 75.4 kg 74.5 kg 76.658 kg     Body mass index is 21.93 kg/m.  General:  Thin, in no acute distress  HEENT: normal Lymph: no adenopathy Neck: no JVD Endocrine:  No thryomegaly Vascular: No carotid bruits; FA pulses 2+ bilaterally without bruits  Cardiac:  normal S1, S2; RRR; S4, 2/6 systolic murmur LSB Lungs:  Decreased breath sounds with bibasilar rales  Abd: soft, nontender, no hepatomegaly  Ext: no edema Musculoskeletal:  No deformities, BUE and BLE strength normal and equal Skin: warm  and dry  Neuro:  CNs 2-12 intact, no focal abnormalities noted Psych:  Normal affect   EKG:  The EKG was personally reviewed and demonstrates: Normal sinus rhythm with LVH and repolarization changes more prominent than EKG 03/2019, LA enlargement Telemetry:  Telemetry was personally reviewed and demonstrates:  Sinus tachycardia at 114/m  Relevant CV Studies: 2D echo 03/2019 IMPRESSIONS      1. The left ventricle has mild-moderately reduced systolic function, with an ejection fraction of 40-45%. The cavity size was normal. There is severely increased left ventricular wall thickness. Left ventricular diastolic Doppler parameters are  consistent with pseudonormalization. Left ventricular diffuse hypokinesis. Although possibly consistent with hypertensive cardiomyopathy in light of reported noncompliance with medical therapy and uncontrolled hypertension, the degree of LVH and  associated atrial enlargement also suggest a hypertrophic cardiomyopathy and the possibility of an infiltrative process.  2. The right ventricle has normal systolic function. The cavity was normal. There is moderately increased right ventricular wall thickness. Right ventricular systolic pressure normal with an estimated pressure of 33.7 mmHg.  3. Left atrial size was severely dilated.  4. Right atrial size was moderately dilated.  5. Small to moderate pericardial effusion. Mitral outflow pattern does not suggest tamponade physiology.  6. The pericardial effusion is circumferential.  7. The tricuspid valve is grossly normal.  8. The aortic valve is tricuspid.  9. The aortic root is normal in size and structure. 10. The inferior vena cava was dilated in size with >50% respiratory variability. 11. The mitral valve is grossly normal.   FINDINGS  Left Ventricle: The left ventricle has mild-moderately reduced systolic function, with an ejection fraction of 40-45%. The cavity size was normal. There is severely increased left  ventricular wall thickness. Left ventricular diastolic Doppler parameters  are consistent with pseudonormalization. Left ventricular diffuse hypokinesis.   Right Ventricle: The right ventricle has normal systolic function. The cavity was normal. There is moderately increased right ventricular wall thickness. Right ventricular  systolic pressure normal with an estimated pressure of 33.7 mmHg.   Left Atrium: Left atrial size was severely dilated.   Right Atrium: Right atrial size was moderately dilated. Right atrial pressure is estimated at 8 mmHg.   Interatrial Septum: No atrial level shunt detected by color flow Doppler.   Pericardium: A moderately sized pericardial effusion is present. The pericardial effusion is circumferential.   Mitral Valve: The mitral valve is grossly normal. Mitral valve regurgitation is trivial by color flow Doppler.   Tricuspid Valve: The tricuspid valve is grossly normal. Tricuspid valve regurgitation is mild by color flow Doppler.   Aortic Valve: The aortic valve is tricuspid Aortic valve regurgitation was not visualized by color flow Doppler.   Pulmonic Valve: The pulmonic valve was grossly normal. Pulmonic valve regurgitation is mild by color flow Doppler.   Aorta: The aortic root is normal in size and structure.   Venous: The inferior vena cava is dilated in size with greater than 50% respiratory variability.     +--------------+--------++ LEFT VENTRICLE         +--------------+--------++ PLAX 2D                +--------------+--------++ LVIDd:        4.67 cm  +--------------+--------++ LVIDs:        3.32 cm  +--------------+--------++ LV PW:        1.64 cm  +--------------+--------++ LV IVS:       2.41 cm  +--------------+--------++ LVOT diam:    2.50 cm  +--------------+--------++ LV SV:        56 ml    +--------------+--------++ LV SV Index:  29.05    +--------------+--------++ LVOT Area:    4.91 cm  +--------------+--------++                        +--------------+--------++   +------------------+---------++ LV Volumes (MOD)            +------------------+---------++ LV area d, A2C:   26.90 cm +------------------+---------++ LV area d, A4C:   33.00 cm +------------------+---------++ LV area s, A2C:   20.10 cm +------------------+---------++ LV area s, A4C:   23.80 cm +------------------+---------++ LV major d, A2C:  7.63 cm   +------------------+---------++ LV major d, A4C:  9.44 cm   +------------------+---------++ LV major s, A2C:  7.47 cm   +------------------+---------++ LV major s, A4C:  8.55 cm   +------------------+---------++ LV vol d, MOD A2C:80.6 ml   +------------------+---------++ LV vol d, MOD A4C:101.0 ml  +------------------+---------++ LV vol s, MOD A2C:46.2 ml   +------------------+---------++ LV vol s, MOD A4C:58.4 ml   +------------------+---------++ LV SV MOD A2C:    34.4 ml   +------------------+---------++ LV SV MOD A4C:    101.0 ml  +------------------+---------++ LV SV MOD BP:     45.2 ml   +------------------+---------++   +---------------+----------++ RIGHT VENTRICLE           +---------------+----------++ RV S prime:    20.40 cm/s +---------------+----------++ TAPSE (M-mode):2.2 cm     +---------------+----------++ RVSP:          33.7 mmHg  +---------------+----------++   +---------------+--------++-----------++ LEFT ATRIUM            Index       +---------------+--------++-----------++ LA diam:       5.00 cm 2.55 cm/m  +---------------+--------++-----------++ LA Vol (A2C):  127.0 ml64.81 ml/m +---------------+--------++-----------++ LA Vol (A4C):  148.0 ml75.53 ml/m +---------------+--------++-----------++ LA Biplane Vol:147.0 ml75.02 ml/m +---------------+--------++-----------++  +------------+---------++-----------++ RIGHT ATRIUM  Index       +------------+---------++-----------++ RA Pressure:8.00 mmHg            +------------+---------++-----------++ RA Area:    23.40 cm            +------------+---------++-----------++ RA Volume:  86.00 ml 43.89 ml/m +------------+---------++-----------++  +------------+-----------++ AORTIC VALVE            +------------+-----------++ LVOT Vmax:  120.00 cm/s +------------+-----------++ LVOT Vmean: 77.900 cm/s +------------+-----------++ LVOT VTI:   0.193 m     +------------+-----------++   +-------------+-------++ AORTA                +-------------+-------++ Ao Root diam:3.40 cm +-------------+-------++   +--------------+----------++ +---------------+-----------++ MITRAL VALVE             TRICUSPID VALVE            +--------------+----------++ +---------------+-----------++ MV Area (PHT):5.84 cm   TR Peak grad:  25.7 mmHg   +--------------+----------++ +---------------+-----------++ MV PHT:       37.70 msec TR Vmax:       273.00 cm/s +--------------+----------++ +---------------+-----------++ MV Decel Time:130 msec   Estimated RAP: 8.00 mmHg   +--------------+----------++ +---------------+-----------++ +--------------+----------++ RVSP:          33.7 mmHg   MV E velocity:92.10 cm/s +---------------+-----------++ +--------------+----------++ MV A velocity:45.00 cm/s +--------------+-------+ +--------------+----------++ SHUNTS                MV E/A ratio: 2.05       +--------------+-------+ +--------------+----------++ Systemic VTI: 0.19 m                               +--------------+-------+                              Systemic Diam:2.50 cm                              +--------------+-------+     Rozann Lesches MD Electronically signed by Rozann Lesches MD Signature Date/Time:  03/10/2019/11:25:04 AM    Laboratory Data:  High Sensitivity Troponin:   Recent Labs  Lab 09/17/19 1347 09/17/19 1600 09/17/19 1915  TROPONINIHS 50* 54* 57*     Chemistry Recent Labs  Lab 09/17/19 1347  NA 134*  K 3.6  CL 105  CO2 22  GLUCOSE 89  BUN 13  CREATININE 1.00  CALCIUM 8.8*  GFRNONAA >60  GFRAA >60  ANIONGAP 7    No results for input(s): PROT, ALBUMIN, AST, ALT, ALKPHOS, BILITOT in the last 168 hours. Hematology Recent Labs  Lab 09/17/19 1347 09/18/19 0036  WBC 5.1 4.4  RBC 4.16* 3.89*  HGB 11.7* 11.1*  HCT 36.2* 33.4*  MCV 87.0 85.9  MCH 28.1 28.5  MCHC 32.3 33.2  RDW 13.7 13.6  PLT 204 202   BNP Recent Labs  Lab 09/17/19 1348  BNP 589.0*    DDimer No results for input(s): DDIMER in the last 168 hours.   Radiology/Studies:  Dg Chest 2 View  Result Date: 09/17/2019 CLINICAL DATA:  Chest pain EXAM: CHEST - 2 VIEW COMPARISON:  03/10/2019 FINDINGS: Cardiomegaly. No frank interstitial edema. No pleural effusion or pneumothorax. Nodular opacity overlying the right mid lung is new from the prior but favors a nipple shadow. Visualized osseous structures are within normal limits. IMPRESSION: No evidence of acute cardiopulmonary disease. Electronically Signed   By: Bertis Ruddy  [place] Pink M.D.   On: 09/17/2019 13:34   Ct Angio Chest Pe W/cm &/or Wo Cm  Result Date: 09/17/2019 CLINICAL DATA:  Recent diagnosis of CHF, lack of follow-up with cardiology, EXAM: CT ANGIOGRAPHY CHEST WITH CONTRAST TECHNIQUE: Multidetector CT imaging of the chest was performed using the standard protocol during bolus administration of intravenous contrast. Multiplanar CT image reconstructions and MIPs were obtained to evaluate the vascular anatomy. CONTRAST:  155mL OMNIPAQUE IOHEXOL 350 MG/ML SOLN COMPARISON:  Radiograph 09/17/2019 FINDINGS: Cardiovascular: Satisfactory opacification of the pulmonary arteries to the segmental level no pulmonary arterial filling defects are identified.  Central pulmonary arteries are at the upper limits of normal for caliber. There is moderate cardiomegaly with biatrial enlargement and suggestion of left ventricular hypertrophy. Moderate volume pericardial effusion of simple attenuation (10 HU). Normal caliber thoracic aorta. Normal 3 vessel branching of the arch. Mediastinum/Nodes: Few low-attenuation mediastinal and hilar nodes are present including: 12 mm AP window lymph node (7/39), 11 mm pretracheal lymph node (7/41), 10 mm right hilar lymph node (7/46). Diffusely increased attenuation of the mediastinum noted as well. No acute abnormality of the trachea. Mild circumferential esophageal wall thickening. Lungs/Pleura: Small left pleural effusion with adjacent passive atelectatic changes. There are patchy areas of ground-glass opacity with mild septal and peribronchovascular thickening. No pneumothorax. No focal consolidative process. Upper Abdomen: No acute abnormalities present in the visualized portions of the upper abdomen. Musculoskeletal: No acute osseous abnormality or suspicious osseous lesion. Review of the MIP images confirms the above findings. IMPRESSION: 1. No evidence of pulmonary embolism. 2. Moderate cardiomegaly with biatrial enlargement and suggestion of left ventricular hypertrophy. 3. Moderate volume pericardial effusion of simple attenuation. 4. Patchy areas of ground-glass opacity with mild septal and peribronchovascular thickening, favored to represent mild pulmonary edema. 5. Small left pleural effusion with adjacent passive atelectatic changes. 6. Few low-attenuation mediastinal and hilar nodes, nonspecific, but favor reactive. 7. Mild circumferential esophageal wall thickening, nonspecific, but can be seen with esophagitis. Electronically Signed   By: Lovena Le M.D.   On: 09/17/2019 17:54    Assessment and Plan:   1. Hypertensive urgency patient ran out of his medications 2 months ago-coreg 12.5 bid lasix 40 IV bid hydralazine  25 mg q 8 imdur 30 mg daily started and BP coming down. Will need social worker to help bridging finances until insurance in Jan. 2. Acute on chronic CHF LVEF 40 to 45% on echo 03/2019- negative 469cc so far 3. Pericardial effusion moderate on CTA 4. 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  Was supposed to have an MRI as an outpatient but never followed up-can be scheduled in Jan once he has insurance. 5. History of chest pain troponins flat suspect demand ischemia      For questions or updates, please contact [place] Please consult [email or web address] for contact info under     Signed, Ermalinda Barrios, PA-C  09/18/2019 8:21 AM   Attending note  Patient seen and discussed with PA Bonnell Public, I agree with her documentation above. 38 yo male long history of HTN with medication noncompliance due to financial constraints, chronic combined systolic/diastolic HF, CKD, HIV.    Admit labs K 3.6 Cr 1 BUN 13 WBC 5.1 Hgb 11.7 Plt 204 BNP 589 hstrop 50-->54-->57 COVID neg CXR no acute process CT PE no PE. Moderate cardiomegaly, moderate pericardial effusion 03/2019 echo LVEF 40-45%, severe LVH, grade II diastolic dysfunction, normal RV function, severe LAE, mod LAE, small to moderate pericardial effusion without tamponade physiology 03/2019 HIV screening  test +    Off home bp meds x 2 months. Significant HTN on presentation SBPs in 180s DBPs in 110s. Presented with chest pain. +orthopnea  Acute on chronic combined systolic/diastolic HF in setting of medication noncompliance and severe HTN. Negative 468mL overnight, BMET pending. Medical therapy affected by costs of meds. Currently on coreg 12.5mg  bid, hydral 25 tid, imdur 30(his prior home regimen). Prior issues with renal dysfunction. Cr below prior values on admission,monitor, may adjust his CHF regimen.    From cardiac standpoint when insurance permits would plan for cardiac MRI. Bening 03/2019 upep/spep does not exlcude  cardiac amyloid. F/u repeat echo to reassess his pericardial effusion.    Just resumed on his prior bp regimen this AM, follow bp's today. Significant orthostatic dizziness this AM, I think from the reintroduction of his bp meds at prior doses as well as diuretic. Lower coreg to 3.125mg  bid, lower imdur to 15mg , hold evening lasix for now. May require slower titration of meds to get back to his prior doses   Carlyle Dolly MD

## 2019-09-19 LAB — PANEL 083904
HIV 1 AB: POSITIVE — AB
HIV 2 AB: NEGATIVE

## 2019-09-19 LAB — BASIC METABOLIC PANEL
Anion gap: 8 (ref 5–15)
BUN: 20 mg/dL (ref 6–20)
CO2: 25 mmol/L (ref 22–32)
Calcium: 8.8 mg/dL — ABNORMAL LOW (ref 8.9–10.3)
Chloride: 101 mmol/L (ref 98–111)
Creatinine, Ser: 1.23 mg/dL (ref 0.61–1.24)
GFR calc Af Amer: >60 mL/min (ref >60)
GFR calc non Af Amer: >60 mL/min (ref >60)
Glucose, Bld: 93 mg/dL (ref 70–99)
Potassium: 3.5 mmol/L (ref 3.5–5.1)
Sodium: 134 mmol/L — ABNORMAL LOW (ref 135–145)

## 2019-09-19 MED ORDER — POTASSIUM CHLORIDE ER 10 MEQ PO TBCR: 10.0000 meq | EXTENDED_RELEASE_TABLET | Freq: Every day | ORAL | 11 refills | Status: DC

## 2019-09-19 MED ORDER — FUROSEMIDE 40 MG PO TABS: 40.0000 mg | ORAL_TABLET | Freq: Every day | ORAL | Status: DC

## 2019-09-19 MED ORDER — ACETAMINOPHEN 325 MG PO TABS: 650.0000 mg | ORAL_TABLET | Freq: Four times a day (QID) | ORAL | 0 refills | Status: DC | PRN

## 2019-09-19 MED ORDER — FERROUS SULFATE 325 (65 FE) MG PO TABS: 325.0000 mg | ORAL_TABLET | Freq: Every day | ORAL | 3 refills | Status: DC

## 2019-09-19 MED ORDER — FUROSEMIDE 40 MG PO TABS: 40.0000 mg | ORAL_TABLET | Freq: Every day | ORAL | 11 refills | Status: DC

## 2019-09-19 MED ORDER — ISOSORBIDE MONONITRATE ER 30 MG PO TB24: 30.0000 mg | ORAL_TABLET | Freq: Every day | ORAL | 11 refills | Status: DC

## 2019-09-19 MED ORDER — HYDRALAZINE HCL 50 MG PO TABS: 50.0000 mg | ORAL_TABLET | Freq: Three times a day (TID) | ORAL | 11 refills | Status: DC

## 2019-09-19 MED ORDER — CARVEDILOL 6.25 MG PO TABS: 6.2500 mg | ORAL_TABLET | Freq: Two times a day (BID) | ORAL | 11 refills | Status: DC

## 2019-09-19 NOTE — Progress Notes (Addendum)
Progress Note  Patient Name: Richard Cox Date of Encounter: 09/19/2019  Primary Cardiologist: Rozann Lesches, MD   Subjective   Breathing much better. Can lay flat. No further dizziness since meds reduced.  Inpatient Medications    Scheduled Meds:  aspirin EC  81 mg Oral Daily   carvedilol  6.25 mg Oral BID WC   cholecalciferol  2,000 Units Oral Daily   enoxaparin (LOVENOX) injection  40 mg Subcutaneous Q24H   ferrous sulfate  325 mg Oral Q breakfast   furosemide  40 mg Intravenous Daily   hydrALAZINE  25 mg Oral Q8H   isosorbide mononitrate  15 mg Oral Daily   potassium chloride  10 mEq Oral Daily   sodium chloride flush  3 mL Intravenous Q12H   Continuous Infusions:  sodium chloride Stopped (09/18/19 0200)   sodium chloride     PRN Meds: sodium chloride, acetaminophen **OR** acetaminophen, bisacodyl, morphine injection, nitroGLYCERIN, ondansetron **OR** ondansetron (ZOFRAN) IV, polyethylene glycol, sodium chloride flush, traMADol   Vital Signs    Vitals:   09/18/19 1448 09/18/19 2049 09/19/19 0500 09/19/19 0518  BP: 119/82 (!) 136/94  (!) 147/98  Pulse: 91 84  83  Resp: 18 16  16   Temp:  100 F (37.8 C)  97.8 F (36.6 C)  TempSrc:  Oral  Oral  SpO2: 98% 98%  97%  Weight:   75.5 kg   Height:        Intake/Output Summary (Last 24 hours) at 09/19/2019 0756 Last data filed at 09/19/2019 0500 Gross per 24 hour  Intake --  Output 2800 ml  Net -2800 ml   Last 3 Weights 09/19/2019 09/18/2019 09/18/2019  Weight (lbs) 166 lb 7.2 oz 166 lb 3.6 oz 164 lb 3.9 oz  Weight (kg) 75.5 kg 75.4 kg 74.5 kg      Telemetry    NSR - Personally Reviewed  ECG       Physical Exam    GEN: No acute distress.   Neck: No JVD Cardiac: RRR, [id number] systolic murmur LSB  Respiratory:Decreased breath sounds with rales/crackles at bases GI: Soft, nontender, non-distended  MS: No edema; No deformity. Neuro:  Nonfocal  Psych: Normal affect   Labs    High  Sensitivity Troponin:   Recent Labs  Lab 09/17/19 1347 09/17/19 1600 09/17/19 1915  TROPONINIHS 50* 54* 57*      Chemistry Recent Labs  Lab 09/17/19 1347 09/18/19 1026 09/19/19 0457  NA 134* 134* 134*  K 3.6 3.6 3.5  CL 105 104 101  CO2 22 23 25   GLUCOSE 89 103* 93  BUN 13 18 20   CREATININE 1.00 1.23 1.23  CALCIUM 8.8* 8.8* 8.8*  GFRNONAA >60 >60 >60  GFRAA >60 >60 >60  ANIONGAP 7 7 8      Hematology Recent Labs  Lab 09/17/19 1347 09/18/19 0036  WBC 5.1 4.4  RBC 4.16* 3.89*  HGB 11.7* 11.1*  HCT 36.2* 33.4*  MCV 87.0 85.9  MCH 28.1 28.5  MCHC 32.3 33.2  RDW 13.7 13.6  PLT 204 202    BNP Recent Labs  Lab 09/17/19 1348  BNP 589.0*     DDimer No results for input(s): DDIMER in the last 168 hours.   Radiology    Dg Chest 2 View  Result Date: 09/17/2019 CLINICAL DATA:  Chest pain EXAM: CHEST - 2 VIEW COMPARISON:  03/10/2019 FINDINGS: Cardiomegaly. No frank interstitial edema. No pleural effusion or pneumothorax. Nodular opacity overlying the right mid lung is new from  the prior but favors a nipple shadow. Visualized osseous structures are within normal limits. IMPRESSION: No evidence of acute cardiopulmonary disease. Electronically Signed   By: Julian Hy M.D.   On: 09/17/2019 13:34   Ct Angio Chest Pe W/cm &/or Wo Cm  Result Date: 09/17/2019 CLINICAL DATA:  Recent diagnosis of CHF, lack of follow-up with cardiology, EXAM: CT ANGIOGRAPHY CHEST WITH CONTRAST TECHNIQUE: Multidetector CT imaging of the chest was performed using the standard protocol during bolus administration of intravenous contrast. Multiplanar CT image reconstructions and MIPs were obtained to evaluate the vascular anatomy. CONTRAST:  133mL OMNIPAQUE IOHEXOL 350 MG/ML SOLN COMPARISON:  Radiograph 09/17/2019 FINDINGS: Cardiovascular: Satisfactory opacification of the pulmonary arteries to the segmental level no pulmonary arterial filling defects are identified. Central pulmonary arteries  are at the upper limits of normal for caliber. There is moderate cardiomegaly with biatrial enlargement and suggestion of left ventricular hypertrophy. Moderate volume pericardial effusion of simple attenuation (10 HU). Normal caliber thoracic aorta. Normal 3 vessel branching of the arch. Mediastinum/Nodes: Few low-attenuation mediastinal and hilar nodes are present including: 12 mm AP window lymph node (7/39), 11 mm pretracheal lymph node (7/41), 10 mm right hilar lymph node (7/46). Diffusely increased attenuation of the mediastinum noted as well. No acute abnormality of the trachea. Mild circumferential esophageal wall thickening. Lungs/Pleura: Small left pleural effusion with adjacent passive atelectatic changes. There are patchy areas of ground-glass opacity with mild septal and peribronchovascular thickening. No pneumothorax. No focal consolidative process. Upper Abdomen: No acute abnormalities present in the visualized portions of the upper abdomen. Musculoskeletal: No acute osseous abnormality or suspicious osseous lesion. Review of the MIP images confirms the above findings. IMPRESSION: 1. No evidence of pulmonary embolism. 2. Moderate cardiomegaly with biatrial enlargement and suggestion of left ventricular hypertrophy. 3. Moderate volume pericardial effusion of simple attenuation. 4. Patchy areas of ground-glass opacity with mild septal and peribronchovascular thickening, favored to represent mild pulmonary edema. 5. Small left pleural effusion with adjacent passive atelectatic changes. 6. Few low-attenuation mediastinal and hilar nodes, nonspecific, but favor reactive. 7. Mild circumferential esophageal wall thickening, nonspecific, but can be seen with esophagitis. Electronically Signed   By: Lovena Le M.D.   On: 09/17/2019 17:54    Cardiac Studies  2 Decho 09/18/19 IMPRESSIONS      1. Left ventricular ejection fraction, by visual estimation, is 40 to 45%. The left ventricle has mildly  decreased function. There is severely increased left ventricular hypertrophy.  2. Left ventricular diastolic parameters are indeterminate.  3. Global right ventricle has normal systolic function.The right ventricular size is normal. Moderately increased right ventricular wall thickness.  4. Left atrial size was moderately dilated.  5. Right atrial size was mildly dilated.  6. Moderate pleural effusion in the left lateral region.  7. Small pericardial effusion.  8. Small to moderate circumferential pericardial effusion. No evidence of tampoande physiology.  9. The mitral valve is normal in structure. No evidence of mitral valve regurgitation. No evidence of mitral stenosis. 10. The tricuspid valve is normal in structure. Tricuspid valve regurgitation is trivial. 11. The aortic valve is tricuspid. Aortic valve regurgitation is not visualized. No evidence of aortic valve sclerosis or stenosis. 12. The pulmonic valve was not well visualized. Pulmonic valve regurgitation is not visualized. 13. Normal pulmonary artery systolic pressure. 14. The inferior vena cava is normal in size with greater than 50% respiratory variability, suggesting right atrial pressure of 3 mmHg. 15. As with prior studies, the degree of LVH and myocardial  appearance could suggest potential infiltrative cardiomyopathy such as amyloidosis. Consider cardiac MRI.   FINDINGS  Left Ventricle: Left ventricular ejection fraction, by visual estimation, is 40 to 45%. The left ventricle has mildly decreased function. There is severely increased left ventricular hypertrophy. Left ventricular diastolic parameters are indeterminate.   Right Ventricle: The right ventricular size is normal. Moderately increased right ventricular wall thickness. Global RV systolic function is has normal systolic function. The tricuspid regurgitant velocity is 2.09 m/s, and with an assumed right atrial  pressure of 8 mmHg, the estimated right ventricular systolic  pressure is normal at 25.5 mmHg.   Left Atrium: Left atrial size was moderately dilated.   Right Atrium: Right atrial size was mildly dilated   Pericardium: A small pericardial effusion is present. Small to moderate circumferential pericardial effusion. No evidence of tampoande physiology. There is a moderate pleural effusion in the left lateral region.   Mitral Valve: The mitral valve is normal in structure. No evidence of mitral valve stenosis by observation. No evidence of mitral valve regurgitation.   Tricuspid Valve: The tricuspid valve is normal in structure. Tricuspid valve regurgitation is trivial.   Aortic Valve: The aortic valve is tricuspid. Aortic valve regurgitation is not visualized. The aortic valve is structurally normal, with no evidence of sclerosis or stenosis. Aortic valve mean gradient measures 2.4 mmHg. Aortic valve peak gradient  measures 6.2 mmHg. Aortic valve area, by VTI measures 4.64 cm.   Pulmonic Valve: The pulmonic valve was not well visualized. Pulmonic valve regurgitation is not visualized. No evidence of pulmonic stenosis.   Aorta: The aortic root is normal in size and structure.   Pulmonary Artery: Indeterminate PASP, inadequate TR jet.     Venous: The inferior vena cava is normal in size with greater than 50% respiratory variability, suggesting right atrial pressure of 3 mmHg.   IAS/Shunts: No atrial level shunt detected by color flow Doppler.        2 D echo 03/2019  IMPRESSIONS      1. The left ventricle has mild-moderately reduced systolic function, with an ejection fraction of 40-45%. The cavity size was normal. There is severely increased left ventricular wall thickness. Left ventricular diastolic Doppler parameters are  consistent with pseudonormalization. Left ventricular diffuse hypokinesis. Although possibly consistent with hypertensive cardiomyopathy in light of reported noncompliance with medical therapy and uncontrolled hypertension,  the degree of LVH and  associated atrial enlargement also suggest a hypertrophic cardiomyopathy and the possibility of an infiltrative process.  2. The right ventricle has normal systolic function. The cavity was normal. There is moderately increased right ventricular wall thickness. Right ventricular systolic pressure normal with an estimated pressure of 33.7 mmHg.  3. Left atrial size was severely dilated.  4. Right atrial size was moderately dilated.  5. Small to moderate pericardial effusion. Mitral outflow pattern does not suggest tamponade physiology.  6. The pericardial effusion is circumferential.  7. The tricuspid valve is grossly normal.  8. The aortic valve is tricuspid.  9. The aortic root is normal in size and structure. 10. The inferior vena cava was dilated in size with >50% respiratory variability. 11. The mitral valve is grossly normal.   FINDINGS  Left Ventricle: The left ventricle has mild-moderately reduced systolic function, with an ejection fraction of 40-45%. The cavity size was normal. There is severely increased left ventricular wall thickness. Left ventricular diastolic Doppler parameters  are consistent with pseudonormalization. Left ventricular diffuse hypokinesis.   Right Ventricle: The right ventricle has normal  systolic function. The cavity was normal. There is moderately increased right ventricular wall thickness. Right ventricular systolic pressure normal with an estimated pressure of 33.7 mmHg.   Left Atrium: Left atrial size was severely dilated.   Right Atrium: Right atrial size was moderately dilated. Right atrial pressure is estimated at 8 mmHg.   Interatrial Septum: No atrial level shunt detected by color flow Doppler.   Pericardium: A moderately sized pericardial effusion is present. The pericardial effusion is circumferential.   Mitral Valve: The mitral valve is grossly normal. Mitral valve regurgitation is trivial by color flow Doppler.    Tricuspid Valve: The tricuspid valve is grossly normal. Tricuspid valve regurgitation is mild by color flow Doppler.   Aortic Valve: The aortic valve is tricuspid Aortic valve regurgitation was not visualized by color flow Doppler.   Pulmonic Valve: The pulmonic valve was grossly normal. Pulmonic valve regurgitation is mild by color flow Doppler.   Aorta: The aortic root is normal in size and structure.   Venous: The inferior vena cava is dilated in size with greater than 50% respiratory variability.     +--------------+--------++  LEFT VENTRICLE            +--------------+--------++  PLAX 2D                   +--------------+--------++  LVIDd:         4.67 cm    +--------------+--------++  LVIDs:         3.32 cm    +--------------+--------++  LV PW:         1.64 cm    +--------------+--------++  LV IVS:        2.41 cm    +--------------+--------++  LVOT diam:     2.50 cm    +--------------+--------++  LV SV:         56 ml      +--------------+--------++  LV SV Index:   29.05      +--------------+--------++  LVOT Area:     4.91 cm   +--------------+--------++                            +--------------+--------++   +------------------+---------++  LV Volumes (MOD)               +------------------+---------++  LV area d, A2C:    26.90 cm   +------------------+---------++  LV area d, A4C:    33.00 cm   +------------------+---------++  LV area s, A2C:    20.10 cm   +------------------+---------++  LV area s, A4C:    23.80 cm   +------------------+---------++  LV major d, A2C:   7.63 cm     +------------------+---------++  LV major d, A4C:   9.44 cm     +------------------+---------++  LV major s, A2C:   7.47 cm     +------------------+---------++  LV major s, A4C:   8.55 cm     +------------------+---------++  LV vol d, MOD A2C: 80.6 ml     +------------------+---------++  LV vol d, MOD A4C: 101.0 ml    +------------------+---------++  LV vol s, MOD  A2C: 46.2 ml     +------------------+---------++  LV vol s, MOD A4C: 58.4 ml     +------------------+---------++  LV SV MOD A2C:     34.4 ml     +------------------+---------++  LV SV MOD A4C:     101.0 ml    +------------------+---------++  LV SV MOD BP:  45.2 ml     +------------------+---------++   +---------------+----------++  RIGHT VENTRICLE              +---------------+----------++  RV S prime:     20.40 cm/s   +---------------+----------++  TAPSE (M-mode): 2.2 cm       +---------------+----------++  RVSP:           33.7 mmHg    +---------------+----------++   +---------------+--------++-----------++  LEFT ATRIUM               Index         +---------------+--------++-----------++  LA diam:        5.00 cm   2.55 cm/m    +---------------+--------++-----------++  LA Vol (A2C):   127.0 ml  64.81 ml/m   +---------------+--------++-----------++  LA Vol (A4C):   148.0 ml  75.53 ml/m   +---------------+--------++-----------++  LA Biplane Vol: 147.0 ml  75.02 ml/m   +---------------+--------++-----------++ +------------+---------++-----------++  RIGHT ATRIUM            Index         +------------+---------++-----------++  RA Pressure: 8.00 mmHg                +------------+---------++-----------++  RA Area:     23.40 cm                +------------+---------++-----------++  RA Volume:   86.00 ml   43.89 ml/m   +------------+---------++-----------++  +------------+-----------++  AORTIC VALVE               +------------+-----------++  LVOT Vmax:   120.00 cm/s   +------------+-----------++  LVOT Vmean:  77.900 cm/s   +------------+-----------++  LVOT VTI:    0.193 m       +------------+-----------++   +-------------+-------++  AORTA                   +-------------+-------++  Ao Root diam: 3.40 cm   +-------------+-------++   +--------------+----------++ +---------------+-----------++  MITRAL VALVE                 TRICUSPID VALVE                +--------------+----------++ +---------------+-----------++  MV Area (PHT): 5.84 cm      TR Peak grad:   25.7 mmHg     +--------------+----------++ +---------------+-----------++  MV PHT:        37.70 msec    TR Vmax:        273.00 cm/s   +--------------+----------++ +---------------+-----------++  MV Decel Time: 130 msec      Estimated RAP:  8.00 mmHg     +--------------+----------++ +---------------+-----------++ +--------------+----------++  RVSP:           33.7 mmHg      MV E velocity: 92.10 cm/s   +---------------+-----------++ +--------------+----------++  MV A velocity: 45.00 cm/s   +--------------+-------+ +--------------+----------++  SHUNTS                   MV E/A ratio:  2.05         +--------------+-------+ +--------------+----------++  Systemic VTI:  0.19 m                                +--------------+-------+                               Systemic Diam: 2.50 cm                               +--------------+-------+  Rozann Lesches MD Electronically signed by Rozann Lesches MD Signature Date/Time: 03/10/2019/11:25:04 AM     Patient Profile     38 y.o. male with a hx of hypertension  admitted with hypertensive urgency and CHF.   Assessment & Plan    1. Hypertensive urgency patient ran out of his medications 2 months ago- became orthostatic on meds yest so reduced to coreg 6.25 bid lasix 40 IV bid-evening dose held hydralazine 25 mg q 8 imdur 15 mg daily started and BP has stabilized. Will need social worker to help bridging finances until insurance in Jan. Appreciate Social workers help. Financial counselor consulted as well. 2. Acute on chronic CHF LVEF 40 to 45% on echo 11/9//2020- negative 3.2 L so far. Continue IV lasix 40 mg once today and can probably convert to po tomorrow. 3. Pericardial effusion moderate on CTA-repeat echo small to mod pericardial effusion. 4. 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  Was supposed to have an MRI  as an outpatient but never followed up-can be scheduled in Jan once he has insurance. 5. History of chest pain troponins flat suspect demand ischemia        For questions or updates, please contact [place] Please consult [email or web address] for contact info under        Signed, Ermalinda Barrios, PA-C  09/19/2019, 7:56 AM    Attending note Patient seen and discussed with PA Bonnell Public, I agree with her documentation above. Presented with severe HTN and acute on chronic combined systolic/diastolic HF in setting of medication noncompliance. Started back on home bp regimen yesterady and given IV lasix, when I saw him in afternoon severe orthostatic symptoms so cut back his coreg and imdur.BP's improved but still elevated, I would slowly titrate his meds at f/u given his significant symptoms yesterday. He likely has been living with SBPs in the 180s for some time, even 150s are a significant  change.   Concern remains for possible infiltrative CM due to his 2D echo findings, needs cardiac MRI. From 03/2019 SPEP and upep  interpretation no monoclonal spike but alone does not exclude possible cardaic amyloid.  [place] for discharge today on current regimen, would add back lasix oral starting tomorrow. We will sign off inpatient care and arrange f/u 2-3 weeks   Carlyle Dolly MD

## 2019-09-19 NOTE — Discharge Summary (Signed)
Richard Cox, is a 38 y.o. male  DOB 1981/05/27  MRN [id number].  Admission date:  09/17/2019  Admitting Physician  Lynetta Mare, MD  Discharge Date:  09/19/2019   Primary MD  Patient, No Pcp Per  Recommendations for primary care physician for things to follow:   - 1)Very low-salt diet advised 2)Weigh yourself daily, call if you gain more than 3 pounds in 1 day or more than 5 pounds in 1 week as your diuretic medications may need to be adjusted 3)Limit your Fluid  intake to no more than 60 ounces (1.8 Liters) per day 4)Avoid ibuprofen/Advil/Aleve/Motrin/Goody Powders/Naproxen/BC powders/Meloxicam/Diclofenac/Indomethacin and other Nonsteroidal anti-inflammatory medications as these will make you more likely to bleed and can cause stomach ulcers, can also cause Kidney problems.  5) follow-up with cardiology team in 2 to 3 weeks as advised for recheck and reevaluation----you will need cardiac MRI study as outpatient 6) complete abstinence from alcohol strongly advised 7) complete abstinence from tobacco advised 8) complete abstinence from street/illicit drugs advised 9) follow-up with primary care physician as scheduled 10) you will need to follow-up with infectious disease physician for management of HIV infection as soon as possible  Admission Diagnosis  Precordial pain [R07.2] Acute pulmonary edema (HCC) [J81.0] SOB (shortness of breath) [R06.02] Hypertensive urgency [I16.0]   Discharge Diagnosis  Precordial pain [R07.2] Acute pulmonary edema (HCC) [J81.0] SOB (shortness of breath) [R06.02] Hypertensive urgency [I16.0]    Principal Problem:   Acute on chronic HFrEF (heart failure with reduced ejection fraction) -combined diastolic and systolic dysfunction CHF Active Problems:   Hypertensive urgency   HIV (human immunodeficiency virus infection) ([place])   Polysubstance abuse -cocaine use in  remission   Alcohol abuse   Congestive heart failure ([place])   Noncompliance with medication regimen   Tobacco abuse      Past Medical History:  Diagnosis Date  . CHF (congestive heart failure) ([place])   . Hypertension   . Noncompliance with medication regimen     Past Surgical History:  Procedure Laterality Date  . NO PAST SURGERIES       HPI  from the history and physical done on the day of admission:    Chief Complaint: Chest pain  HPI: Richard Cox is a 38 y.o. male with medical history significant of congestive heart failure, hypertension who presented to the ER with chest pain, uncontrolled hypertension.  Patient has a known history of hypertension and is supposed to be on Coreg, hydralazine, Imdur, Lasix at home but has not been taking for the last 2 months as he ran out of medications and did not seek any care.  Does not have a primary care doctor.  He says has been having intermittent chest pain on the left side of the chest as well as in the substernal region going up to the neck on and off for the past few days.  He also reports shortness of breath with exertion as well as orthopnea.  Denies any fever, cough, chills, nausea, vomiting, abdominal pain, palpitations,  leg swelling.  He says he came in to the hospital to get refills on his medications and for evaluation. ED Course:  Vital Signs reviewed on presentation, significant for temperature 99.8, initial blood pressure 183/114, heart rate 94, saturation 90% on room air. Labs reviewed, significant for sodium 134, potassium 3.6, BUN 13, creatinine 1.0, BNP 589, troponins noted to be 50, 54, 57.  WBC count 5.1, hemoglobin 11.7, hematocrit 36, platelets 204, SARS-CoV-2 RT-PCR has been sent. Imaging personally Reviewed, CT angiogram of the chest shows no evidence of pulmonary embolism. EKG personally reviewed, shows sinus rhythm, ST depressions and T wave inversions in anterolateral leads.     Hospital Course:   Brief Summary  -38 year old with past medical history relevant for polysubstance abuse including prior history of cocaine/THC, alcohol and tobacco abuse as well as history of HFrEF with  combined systolic/diastolic HF in setting of medication noncompliance and severe HTN--readmitted on 09/17/2019 with worsening shortness of breath and edema   A/p  1)Acute on chronic combined systolic/Diastolic HF in setting of medication noncompliance and severe HTN----cardiology consult appreciated,  -Last known EF of 40 to 45%, repeat echo this admission with unchanged EF of 40 to 45% -Prior work-up suggested possible hypertensive cardiomyopathy versus hypertrophic cardiomyopathy with the need to rule out possible infiltrative process like amyloidosis--- patient will need cardiac MRI as outpatient once Payer source can be secured -Overall diuresed well -Orthopnea has resolved, dyspnea at rest-resolved, dyspnea on exertion has improved significantly-Okay to discharge on Coreg, Lasix, and hydralazine/Imdur combo   2)Possible Pericardial Effusion/Possible Pericarditis--r repeat echocardiogram as above #1 -Cardiology input appreciated -ESR is 92, CRP is 9.6 -Outpatient cardiac MRI advised  3)Hypertensive urgency/uncontrolled HTN--patient is notoriously noncompliant with antihypertensives- -discharge on Lasix, Coreg, hydralazine/Imdur combo as above #1  4)HIV Positive --- patient was diagnosed back in May 2020, the need to follow-up with infectious disease strongly emphasized -Despite persuasion patient insist he is not ready to follow-up with infectious disease with regards to his HIV diagnosis at this time -Patient reminded that  HIV medication can be obtained without cost to him if he follows up with ID physician -He declines outpatient ID appointment at this time  5)Polysubstance Abuse-patient continues to use alcohol and tobacco, indicates is no longer uses cocaine -Complete abstinence from tobacco and alcohol  advised  Early home, need to be compliant with medications advised  Code Status : Full code  Family Communication:   NA (patient is alert, awake and coherent)   Disposition Plan  : home in am   Consults  :  cardiology  Discharge Condition: Stable  Follow UP--- follow up with cardiology and infectious disease strongly advised   Diet and Activity recommendation:  As advised  Discharge Instructions    Discharge Instructions    Call MD for:  difficulty breathing, headache or visual disturbances   Complete by: As directed    Call MD for:  extreme fatigue   Complete by: As directed    Call MD for:  persistant dizziness or light-headedness   Complete by: As directed    Call MD for:  persistant nausea and vomiting   Complete by: As directed    Call MD for:  severe uncontrolled pain   Complete by: As directed    Call MD for:  temperature >100.4   Complete by: As directed    Diet - low sodium heart healthy   Complete by: As directed    Discharge instructions   Complete by: As directed  1)Very low-salt diet advised 2)Weigh yourself daily, call if you gain more than 3 pounds in 1 day or more than 5 pounds in 1 week as your diuretic medications may need to be adjusted 3)Limit your Fluid  intake to no more than 60 ounces (1.8 Liters) per day 4)Avoid ibuprofen/Advil/Aleve/Motrin/Goody Powders/Naproxen/BC powders/Meloxicam/Diclofenac/Indomethacin and other Nonsteroidal anti-inflammatory medications as these will make you more likely to bleed and can cause stomach ulcers, can also cause Kidney problems.  5) follow-up with cardiology team in 2 to 3 weeks as advised for recheck and reevaluation----you will need cardiac MRI study as outpatient 6) complete abstinence from alcohol strongly advised 7) complete abstinence from tobacco advised 8) complete abstinence from street/illicit drugs advised 9) follow-up with primary care physician as scheduled   Increase  activity slowly   Complete by: As directed         Discharge Medications     Allergies as of 09/19/2019      Reactions   Neosporin [neomycin-bacitracin Zn-polymyx] Rash      Medication List    TAKE these medications   acetaminophen 325 MG tablet Commonly known as: TYLENOL Take 2 tablets (650 mg total) by mouth every 6 (six) hours as needed for mild pain, fever or headache (or Fever >/= 101). What changed:   medication strength  how much to take  when to take this  reasons to take this   carvedilol 6.25 MG tablet Commonly known as: COREG Take 1 tablet (6.25 mg total) by mouth 2 (two) times daily with a meal. For BP and Heart What changed:   medication strength  how much to take  additional instructions   ferrous sulfate 325 (65 FE) MG tablet Take 1 tablet (325 mg total) by mouth daily with breakfast.   furosemide 40 MG tablet Commonly known as: LASIX Take 1 tablet (40 mg total) by mouth daily.   hydrALAZINE 50 MG tablet Commonly known as: APRESOLINE Take 1 tablet (50 mg total) by mouth 3 (three) times daily. For Heart and Blood Pressure What changed:   medication strength  how much to take  when to take this  additional instructions   isosorbide mononitrate 30 MG 24 hr tablet Commonly known as: IMDUR Take 1 tablet (30 mg total) by mouth daily. For Heart and Blood Pressure What changed: additional instructions   potassium chloride 10 MEQ tablet Commonly known as: KLOR-CON Take 1 tablet (10 mEq total) by mouth daily.       Major procedures and Radiology Reports - PLEASE review detailed and final reports for all details, in brief -   Dg Chest 2 View  Result Date: 09/17/2019 CLINICAL DATA:  Chest pain EXAM: CHEST - 2 VIEW COMPARISON:  03/10/2019 FINDINGS: Cardiomegaly. No frank interstitial edema. No pleural effusion or pneumothorax. Nodular opacity overlying the right mid lung is new from the prior but favors a nipple shadow. Visualized osseous  structures are within normal limits. IMPRESSION: No evidence of acute cardiopulmonary disease. Electronically Signed   By: Julian Hy M.D.   On: 09/17/2019 13:34   Ct Angio Chest Pe W/cm &/or Wo Cm  Result Date: 09/17/2019 CLINICAL DATA:  Recent diagnosis of CHF, lack of follow-up with cardiology, EXAM: CT ANGIOGRAPHY CHEST WITH CONTRAST TECHNIQUE: Multidetector CT imaging of the chest was performed using the standard protocol during bolus administration of intravenous contrast. Multiplanar CT image reconstructions and MIPs were obtained to evaluate the vascular anatomy. CONTRAST:  125m OMNIPAQUE IOHEXOL 350 MG/ML SOLN COMPARISON:  Radiograph 09/17/2019 FINDINGS:  Cardiovascular: Satisfactory opacification of the pulmonary arteries to the segmental level no pulmonary arterial filling defects are identified. Central pulmonary arteries are at the upper limits of normal for caliber. There is moderate cardiomegaly with biatrial enlargement and suggestion of left ventricular hypertrophy. Moderate volume pericardial effusion of simple attenuation (10 HU). Normal caliber thoracic aorta. Normal 3 vessel branching of the arch. Mediastinum/Nodes: Few low-attenuation mediastinal and hilar nodes are present including: 12 mm AP window lymph node (7/39), 11 mm pretracheal lymph node (7/41), 10 mm right hilar lymph node (7/46). Diffusely increased attenuation of the mediastinum noted as well. No acute abnormality of the trachea. Mild circumferential esophageal wall thickening. Lungs/Pleura: Small left pleural effusion with adjacent passive atelectatic changes. There are patchy areas of ground-glass opacity with mild septal and peribronchovascular thickening. No pneumothorax. No focal consolidative process. Upper Abdomen: No acute abnormalities present in the visualized portions of the upper abdomen. Musculoskeletal: No acute osseous abnormality or suspicious osseous lesion. Review of the MIP images confirms the above  findings. IMPRESSION: 1. No evidence of pulmonary embolism. 2. Moderate cardiomegaly with biatrial enlargement and suggestion of left ventricular hypertrophy. 3. Moderate volume pericardial effusion of simple attenuation. 4. Patchy areas of ground-glass opacity with mild septal and peribronchovascular thickening, favored to represent mild pulmonary edema. 5. Small left pleural effusion with adjacent passive atelectatic changes. 6. Few low-attenuation mediastinal and hilar nodes, nonspecific, but favor reactive. 7. Mild circumferential esophageal wall thickening, nonspecific, but can be seen with esophagitis. Electronically Signed   By: Lovena Le M.D.   On: 09/17/2019 17:54    Micro Results   Recent Results (from the past 240 hour(s))  SARS CORONAVIRUS 2 (TAT 6-24 HRS) Nasopharyngeal Nasopharyngeal Swab     Status: None   Collection Time: 09/17/19  6:56 PM   Specimen: Nasopharyngeal Swab  Result Value Ref Range Status   SARS Coronavirus 2 NEGATIVE NEGATIVE Final    Comment: (NOTE) SARS-CoV-2 target nucleic acids are NOT DETECTED. The SARS-CoV-2 RNA is generally detectable in upper and lower respiratory specimens during the acute phase of infection. Negative results do not preclude SARS-CoV-2 infection, do not rule out co-infections with other pathogens, and should not be used as the sole basis for treatment or other patient management decisions. Negative results must be combined with clinical observations, patient history, and epidemiological information. The expected result is Negative. Fact Sheet for Patients: SugarRoll.be Fact Sheet for Healthcare Providers: https://www.woods-mathews.com/ This test is not yet approved or cleared by the [place] FDA and  has been authorized for detection and/or diagnosis of SARS-CoV-2 by FDA under an Emergency Use Authorization (EUA). This EUA will remain  in effect (meaning this test can be used) for the  duration of the COVID-19 declaration under Section 56 4(b)(1) of the Act, 21 U.S.C. section 360bbb-3(b)(1), unless the authorization is terminated or revoked sooner. Performed at [facility], [place] [address], [place], [place] [address]        Today   Subjective    Richard Cox today has no new complaints -Unwilling to follow-up with infectious disease at this time -No chest pains no palpitations no dyspnea, no orthopnea          Patient has been seen and examined prior to discharge   Objective   Blood pressure (!) 157/98, pulse 80, temperature 97.8 F (36.6 C), temperature source Oral, resp. rate 16, height '6\' 1"'  (1.854 m), weight 75.1 kg, SpO2 97 %.   Intake/Output Summary (Last 24 hours) at 09/19/2019 1122  Last data filed at 09/19/2019 0500 Gross per 24 hour  Intake -  Output 2600 ml  Net -2600 ml    Exam Gen:- Awake Alert, no acute distress  HEENT:- [place].AT, No sclera icterus Neck-Supple Neck,No JVD,.  Lungs-improving air movement, no wheezing or rales CV- S1, S2 normal, regular Abd-  +ve B.Sounds, Abd Soft, No tenderness,    Extremity/Skin:- No  edema,   good pulses Psych-affect is appropriate, oriented x3 Neuro-no new focal deficits, no tremors    Data Review   CBC w Diff:  Lab Results  Component Value Date   WBC 4.4 09/18/2019   HGB 11.1 (L) 09/18/2019   HCT 33.4 (L) 09/18/2019   PLT 202 09/18/2019   LYMPHOPCT 36 03/11/2019   MONOPCT 8 03/11/2019   EOSPCT 0 03/11/2019   BASOPCT 0 03/11/2019    CMP:  Lab Results  Component Value Date   NA 134 (L) 09/19/2019   K 3.5 09/19/2019   CL 101 09/19/2019   CO2 25 09/19/2019   BUN 20 09/19/2019   CREATININE 1.23 09/19/2019   PROT 7.5 03/10/2019   ALBUMIN 2.4 (L) 03/13/2019   BILITOT 0.6 03/10/2019   ALKPHOS 93 03/10/2019   AST 30 03/10/2019   ALT 28 03/10/2019  .   Total Discharge time is about 33 minutes  Roxan Hockey M.D on 09/19/2019 at 11:22 AM  Go to [email or web address] -  for  contact info  [facility] - Office  [phone]

## 2019-09-19 NOTE — TOC Transition Note (Signed)
Transition of Care [facility]) - CM/SW Discharge Note   Patient Details  Name: Richard Cox MRN: [id number] Date of Birth: 1981/10/18  Transition of [facility] CM/SW Contact:  Boneta Lucks, RN Phone Number: 09/19/2019, 10:43 AM   Clinical Narrative:   Patient discharging home today. Patient states [facility] billed him $300 for first visit. His mother is saying she will pay the bill so he can make another appointment.  Patient states he will get insurance in January and find another PCP at that time.     Final next level of care: Home/Self Care Barriers to Discharge: Inadequate or no insurance   Patient Goals and CMS Choice Patient states their goals for this hospitalization and ongoing recovery are:: to go home.      Discharge Placement                    Patient and family notified of of transfer: 09/19/19   Readmission Risk Interventions No flowsheet data found.

## 2019-09-19 NOTE — Progress Notes (Signed)
Nsg Discharge Note  Admit Date:  09/17/2019 Discharge date: 09/19/2019   Richard Cox to be D/C'd home per MD order.  AVS completed.  Copy for chart, and copy for patient signed, and dated. Patient/caregiver able to verbalize understanding.  Discharge Medication: Allergies as of 09/19/2019      Reactions   Neosporin [neomycin-bacitracin Zn-polymyx] Rash      Medication List    TAKE these medications   acetaminophen 325 MG tablet Commonly known as: TYLENOL Take 2 tablets (650 mg total) by mouth every 6 (six) hours as needed for mild pain, fever or headache (or Fever >/= 101). What changed:   medication strength  how much to take  when to take this  reasons to take this   carvedilol 6.25 MG tablet Commonly known as: COREG Take 1 tablet (6.25 mg total) by mouth 2 (two) times daily with a meal. For BP and Heart What changed:   medication strength  how much to take  additional instructions   ferrous sulfate 325 (65 FE) MG tablet Take 1 tablet (325 mg total) by mouth daily with breakfast.   furosemide 40 MG tablet Commonly known as: LASIX Take 1 tablet (40 mg total) by mouth daily.   hydrALAZINE 50 MG tablet Commonly known as: APRESOLINE Take 1 tablet (50 mg total) by mouth 3 (three) times daily. For Heart and Blood Pressure What changed:   medication strength  how much to take  when to take this  additional instructions   isosorbide mononitrate 30 MG 24 hr tablet Commonly known as: IMDUR Take 1 tablet (30 mg total) by mouth daily. For Heart and Blood Pressure What changed: additional instructions   potassium chloride 10 MEQ tablet Commonly known as: KLOR-CON Take 1 tablet (10 mEq total) by mouth daily.       Discharge Assessment: Vitals:   09/19/19 0518 09/19/19 0759  BP: (!) 147/98 (!) 157/98  Pulse: 83 80  Resp: 16   Temp: 97.8 F (36.6 C)   SpO2: 97%    Skin clean, dry and intact without evidence of skin break down, no evidence of skin  tears noted. IV catheter discontinued intact. Site without signs and symptoms of complications - no redness or edema noted at insertion site, patient denies c/o pain - only slight tenderness at site.  Dressing with slight pressure applied.  D/c Instructions-Education: Discharge instructions given to patient/family with verbalized understanding. D/c education completed with patient/family including follow up instructions, medication list, d/c activities limitations if indicated, with other d/c instructions as indicated by MD - patient able to verbalize understanding, all questions fully answered. Patient instructed to return to ED, call 911, or call MD for any changes in condition.  Patient escorted via [place], and D/C home via private auto.  Carney Corners, RN 09/19/2019 11:59 AM

## 2019-09-19 NOTE — Discharge Instructions (Signed)
1)Very low-salt diet advised 2)Weigh yourself daily, call if you gain more than 3 pounds in 1 day or more than 5 pounds in 1 week as your diuretic medications may need to be adjusted 3)Limit your Fluid  intake to no more than 60 ounces (1.8 Liters) per day 4)Avoid ibuprofen/Advil/Aleve/Motrin/Goody Powders/Naproxen/BC powders/Meloxicam/Diclofenac/Indomethacin and other Nonsteroidal anti-inflammatory medications as these will make you more likely to bleed and can cause stomach ulcers, can also cause Kidney problems.  5) follow-up with cardiology team in 2 to 3 weeks as advised for recheck and reevaluation----you will need cardiac MRI study as outpatient 6) complete abstinence from alcohol strongly advised 7) complete abstinence from tobacco advised 8) complete abstinence from street/illicit drugs advised 9) follow-up with primary care physician as scheduled 10) you will need to follow-up with infectious disease physician for management of HIV infection as soon as possible

## 2019-09-20 LAB — ENA+DNA/DS+ANTICH+CENTRO+JO...
Anti JO-1: <0.2 AI (ref 0.0–0.9)
Centromere Ab Screen: 5.1 AI — ABNORMAL HIGH (ref 0.0–0.9)
Chromatin Ab SerPl-aCnc: 0.5 AI (ref 0.0–0.9)
ENA SM Ab Ser-aCnc: 0.2 AI (ref 0.0–0.9)
Ribonucleic Protein: 0.3 AI (ref 0.0–0.9)
SSA (Ro) (ENA) Antibody, IgG: <0.2 AI (ref 0.0–0.9)
SSB (La) (ENA) Antibody, IgG: <0.2 AI (ref 0.0–0.9)
Scleroderma (Scl-70) (ENA) Antibody, IgG: <0.2 AI (ref 0.0–0.9)
ds DNA Ab: 4 IU/mL (ref 0–9)

## 2019-09-20 LAB — ANA W/REFLEX IF POSITIVE: Anti Nuclear Antibody (ANA): POSITIVE — AB

## 2019-09-26 ENCOUNTER — Other Ambulatory Visit: Payer: Self-pay | Admitting: *Deleted

## 2019-09-26 ENCOUNTER — Other Ambulatory Visit: Payer: Self-pay

## 2019-09-26 ENCOUNTER — Ambulatory Visit: Payer: Self-pay

## 2019-09-26 DIAGNOSIS — B20 Human immunodeficiency virus [HIV] disease: Secondary | ICD-10-CM

## 2019-09-26 DIAGNOSIS — Z113 Encounter for screening for infections with a predominantly sexual mode of transmission: Secondary | ICD-10-CM

## 2019-09-26 DIAGNOSIS — Z79899 Other long term (current) drug therapy: Secondary | ICD-10-CM

## 2019-09-27 LAB — T-HELPER CELL (CD4) - (RCID CLINIC ONLY)
CD4 % Helper T Cell: 11 % — ABNORMAL LOW (ref 33–65)
CD4 T Cell Abs: 187 /uL — ABNORMAL LOW (ref 400–1790)

## 2019-09-27 LAB — URINE CYTOLOGY ANCILLARY ONLY
Chlamydia: NEGATIVE
Comment: NEGATIVE
Comment: NORMAL
Neisseria Gonorrhea: NEGATIVE

## 2019-09-27 LAB — URINALYSIS
Bilirubin Urine: NEGATIVE
Glucose, UA: NEGATIVE
Hgb urine dipstick: NEGATIVE
Ketones, ur: NEGATIVE
Leukocytes,Ua: NEGATIVE
Nitrite: NEGATIVE
Specific Gravity, Urine: 1.023 (ref 1.001–1.03)
pH: 5.5 (ref 5.0–8.0)

## 2019-10-10 ENCOUNTER — Telehealth: Payer: Self-pay | Admitting: Pharmacy Technician

## 2019-10-10 ENCOUNTER — Other Ambulatory Visit: Payer: Self-pay

## 2019-10-10 ENCOUNTER — Encounter: Payer: Self-pay | Admitting: Family

## 2019-10-10 ENCOUNTER — Ambulatory Visit (INDEPENDENT_AMBULATORY_CARE_PROVIDER_SITE_OTHER): Payer: Self-pay | Admitting: Family

## 2019-10-10 VITALS — BP 138/94 | HR 78 | Temp 97.8°F | Wt 169.0 lb

## 2019-10-10 DIAGNOSIS — I5041 Acute combined systolic (congestive) and diastolic (congestive) heart failure: Secondary | ICD-10-CM

## 2019-10-10 DIAGNOSIS — F1010 Alcohol abuse, uncomplicated: Secondary | ICD-10-CM

## 2019-10-10 DIAGNOSIS — Z0000 Encounter for general adult medical examination without abnormal findings: Secondary | ICD-10-CM

## 2019-10-10 DIAGNOSIS — B20 Human immunodeficiency virus [HIV] disease: Secondary | ICD-10-CM

## 2019-10-10 DIAGNOSIS — Z720 Tobacco use: Secondary | ICD-10-CM

## 2019-10-10 DIAGNOSIS — Z23 Encounter for immunization: Secondary | ICD-10-CM

## 2019-10-10 MED ORDER — SULFAMETHOXAZOLE-TRIMETHOPRIM 800-160 MG PO TABS: 1.0000 | ORAL_TABLET | Freq: Every day | ORAL | 2 refills | Status: DC

## 2019-10-10 MED ORDER — BICTEGRAVIR-EMTRICITAB-TENOFOV 50-200-25 MG PO TABS: 1.0000 | ORAL_TABLET | Freq: Every day | ORAL | 2 refills | Status: DC

## 2019-10-10 MED FILL — BIKTARVY 50-200-25 MG TABS: 50-200-25 | 30 days supply | Qty: 30 | Fill #0

## 2019-10-10 MED FILL — SULFAMETHOXAZOLE-TMP DS TAB: 800-160 | 30 days supply | Qty: 30 | Fill #0

## 2019-10-10 NOTE — Assessment & Plan Note (Signed)
Has been sober for 4 months now. Encouraged to continue sobriety and discussed counseling options as well as AA as needed.

## 2019-10-10 NOTE — Patient Instructions (Addendum)
Nice to meet you.  We will get you started on medication today with Biktarvy and Bactrim.  Please take your iron apart from the [place].   Please let us know if you have any questions or concerns  We will plan to follow-up in 1 month or sooner if needed with lab work on the same day.  Have a great day and happy holidays!

## 2019-10-10 NOTE — Assessment & Plan Note (Signed)
Mr. Duford is a 38 y/o male with CDC Stage 3 AIDs who was initially diagnosed in on 03/11/19 with initial viral load of 65,200 and CD4 nadir of 188. He remains treatment naive and has no signs/symptoms of opportunistic infection at present. We discussed the pathophysiology, transmission, prevention, risks if left untreated/progression, and treatment options for HIV disease with questions answered. We reviewed lab work and discussed the plan of care to include getting him started on Biktarvy supplemented with Bactrim for OI prophylaxis due to CD4 count <200. Clinic orientation provided. He met with our pharmacy staff for medication assistance and will be able to start his medications today. Plan for follow up in 1 month or sooner if need and will recheck blood work at that time.

## 2019-10-10 NOTE — Assessment & Plan Note (Signed)
   Influenza vaccination updated today.  Discussed importance of safe sexual practice to reduce risk of STI. Most recent testing negative. Declines condoms.

## 2019-10-10 NOTE — Telephone Encounter (Addendum)
[facility] Patient Advocate Encounter  Completed and sent [employer] Advancing Access application for [place] for this patient who is uninsured.    Patient is approved 10/10/2019 through 10/09/2020.  BIN      [id number] PCN    [id number] GRP    [id number] ID        [id number]   Inez Catalina E. Nadara Mustard [place] Patient [facility] for Infectious Disease Phone: [phone] Fax:  [phone]

## 2019-10-10 NOTE — Assessment & Plan Note (Signed)
Continues to smoke tobacco although decreased. Discussed importance of tobacco cessation to reduce risk of cardiovascular, respiratory and malignant disease in the future. He is in the pre-contemplation stage and not ready to quit. Will continue to monitor.

## 2019-10-10 NOTE — Progress Notes (Signed)
Subjective:    Patient ID: Richard Cox, male    DOB: February 25, 1981, 38 y.o.   MRN: [id number]  Chief Complaint  Patient presents with   New Patient (Initial Visit)    chest pain since yesterday, congestion, cough since yesterday   HIV Positive/AIDS    HPI:  Richard Cox is a 38 y.o. male with previous medical history of hypertension and congestive heart failure who presents today to establish care for newly diagnosed HIV disease.   Richard Cox was initially diagnosed with HIV during a hospitalization in May of 2020 for heart failure and hypertensive urgency when his routine screening test returned positive. Risk factor for acquiring HIV is MSM. At the time of his hospitalization he had no symptoms. He did not engage in care at that time due feeling depressed about his new diagnosis. He has not told anyone about his positive status but has come to terms with the diagnosis and is ready to start medication therapy after doing research online.  Richard Cox initial clinic blood work from 03/11/2019 shows a CD4 nadir of 188 with initial viral load of 65,200 which places him in CDC category 3 being AIDS.  Since his initial diagnosis he has not received treatment.  Most recent blood work completed on 09/26/2019 confirms HIV infection with CD4 count of 187.  Viral load and genotype remain pending.  QuantiFERON gold and HLA-[id number] were negative. STI testing for gonorrhea, chlamydia and syphilis were also negative. Renal function, electrolytes and hepatic function were normal.  Richard Cox currently works full time at [employer] [place]. He is over the income limit for ADAP/UMAP assistance and is currently uninsured. No recreational or illicit drug use or alcohol consumption recently. Does have significant history for marijuana use in the past. He does smoke tobacco periodically.  Overall feeling okay today with some congestion that has started within the last 24 hours. Denies fevers, chills, night sweats, headaches,  changes in vision, neck pain/stiffness, nausea, diarrhea, vomiting, lesions or rashes. He is interested in obtaining his flu shot today.    Allergies  Allergen Reactions   Neosporin [Neomycin-Bacitracin Zn-Polymyx] Rash      Outpatient Medications Prior to Visit  Medication Sig Dispense Refill   acetaminophen (TYLENOL) 325 MG tablet Take 2 tablets (650 mg total) by mouth every 6 (six) hours as needed for mild pain, fever or headache (or Fever >/= 101). 12 tablet 0   carvedilol (COREG) 6.25 MG tablet Take 1 tablet (6.25 mg total) by mouth 2 (two) times daily with a meal. For BP and Heart 60 tablet 11   ferrous sulfate 325 (65 FE) MG tablet Take 1 tablet (325 mg total) by mouth daily with breakfast. 30 tablet 3   furosemide (LASIX) 40 MG tablet Take 1 tablet (40 mg total) by mouth daily. 30 tablet 11   hydrALAZINE (APRESOLINE) 50 MG tablet Take 1 tablet (50 mg total) by mouth 3 (three) times daily. For Heart and Blood Pressure 90 tablet 11   isosorbide mononitrate (IMDUR) 30 MG 24 hr tablet Take 1 tablet (30 mg total) by mouth daily. For Heart and Blood Pressure 30 tablet 11   potassium chloride (KLOR-CON) 10 MEQ tablet Take 1 tablet (10 mEq total) by mouth daily. 30 tablet 11   No facility-administered medications prior to visit.      Past Medical History:  Diagnosis Date   CHF (congestive heart failure) (HCC)    HIV infection ([place])    Hypertension    Noncompliance with  medication regimen       Past Surgical History:  Procedure Laterality Date   NO PAST SURGERIES        Family History  Problem Relation Age of Onset   Cancer Mother    Diabetes Mellitus II Mother    Cancer Father       Social History   Socioeconomic History   Marital status: Single    Spouse name: Not on file   Number of children: 0   Years of education: 12   Highest education level: Not on file  Occupational History   Not on file  Social Needs   Financial resource  strain: Not on file   Food insecurity    Worry: Not on file    Inability: Not on file   Transportation needs    Medical: Not on file    Non-medical: Not on file  Tobacco Use   Smoking status: Light Tobacco Smoker    Packs/day: 1.00    Types: Cigarettes    Last attempt to quit: 11/23/2018    Years since quitting: 0.8   Smokeless tobacco: Never Used  Substance and Sexual Activity   Alcohol use: Not Currently    Comment: quit alcohol 4 months ago    Drug use: Not Currently    Types: Marijuana    Comment: Quit about a year ago   Sexual activity: Not on file  Lifestyle   Physical activity    Days per week: Not on file    Minutes per session: Not on file   Stress: Not on file  Relationships   Social connections    Talks on phone: Not on file    Gets together: Not on file    Attends religious service: Not on file    Active member of club or organization: Not on file    Attends meetings of clubs or organizations: Not on file    Relationship status: Not on file   Intimate partner violence    Fear of current or ex partner: Not on file    Emotionally abused: Not on file    Physically abused: Not on file    Forced sexual activity: Not on file  Other Topics Concern   Not on file  Social History Narrative   Not on file      Review of Systems  Constitutional: Negative for appetite change, chills, fatigue, fever and unexpected weight change.  HENT: Positive for congestion.   Eyes: Negative for visual disturbance.  Respiratory: Negative for cough, chest tightness, shortness of breath and wheezing.   Cardiovascular: Negative for chest pain and leg swelling.  Gastrointestinal: Negative for abdominal pain, constipation, diarrhea, nausea and vomiting.  Genitourinary: Negative for dysuria, flank pain, frequency, genital sores, hematuria and urgency.  Skin: Negative for rash.  Allergic/Immunologic: Negative for immunocompromised state.  Neurological: Negative for  dizziness and headaches.       Objective:    BP (!) 138/94    Pulse 78    Temp 97.8 F (36.6 C) (Oral)    Wt 169 lb (76.7 kg)    BMI 22.30 kg/m  Nursing note and vital signs reviewed.  Physical Exam Constitutional:      General: He is not in acute distress.    Appearance: He is well-developed.  Eyes:     Conjunctiva/sclera: Conjunctivae normal.  Neck:     Musculoskeletal: Neck supple.  Cardiovascular:     Rate and Rhythm: Normal rate and regular rhythm.  Heart sounds: Normal heart sounds. No murmur. No friction rub. No gallop.   Pulmonary:     Effort: Pulmonary effort is normal. No respiratory distress.     Breath sounds: Normal breath sounds. No wheezing or rales.  Chest:     Chest wall: No tenderness.  Abdominal:     General: Bowel sounds are normal.     Palpations: Abdomen is soft.     Tenderness: There is no abdominal tenderness.  Lymphadenopathy:     Cervical: No cervical adenopathy.  Skin:    General: Skin is warm and dry.     Findings: No rash.  Neurological:     Mental Status: He is alert and oriented to person, place, and time.  Psychiatric:        Behavior: Behavior normal.        Thought Content: Thought content normal.        Judgment: Judgment normal.         Assessment & Plan:   Patient Active Problem List   Diagnosis Date Noted   AIDS (acquired immune deficiency syndrome) ([place]) 10/10/2019   Healthcare maintenance 10/10/2019   Acute on chronic HFrEF (heart failure with reduced ejection fraction) -combined diastolic and systolic dysfunction CHF 16/08/9603   HIV (human immunodeficiency virus infection) ([place]) 09/18/2019   Polysubstance abuse -cocaine use in remission 09/18/2019   Alcohol abuse 09/18/2019   Tobacco abuse 09/18/2019   Nonspecific serologic evidence of human immunodeficiency virus (HIV) 03/13/2019   Acute exacerbation of CHF (congestive heart failure) ([place]) 03/10/2019   Hypertensive urgency 03/10/2019   Congestive  heart failure ([place]) 03/10/2019   SOB (shortness of breath) 03/10/2019   Noncompliance with medication regimen 03/10/2019     Problem List Items Addressed This Visit      Cardiovascular and Mediastinum   Congestive heart failure ([place])    Establishing with Cardiology for continued care. Most recent EF of 40-45%. Blood pressure borderline today. No signs/symptoms of exacerbation at present. Continue to follow up with cardiology as planned.         Other   Alcohol abuse    Has been sober for 4 months now. Encouraged to continue sobriety and discussed counseling options as well as AA as needed.       Tobacco abuse    Continues to smoke tobacco although decreased. Discussed importance of tobacco cessation to reduce risk of cardiovascular, respiratory and malignant disease in the future. He is in the pre-contemplation stage and not ready to quit. Will continue to monitor.       AIDS (acquired immune deficiency syndrome) ([place]) - Primary    Richard Cox is a 38 y/o male with CDC Stage 3 AIDs who was initially diagnosed in on 03/11/19 with initial viral load of 65,200 and CD4 nadir of 188. He remains treatment naive and has no signs/symptoms of opportunistic infection at present. We discussed the pathophysiology, transmission, prevention, risks if left untreated/progression, and treatment options for HIV disease with questions answered. We reviewed lab work and discussed the plan of care to include getting him started on Biktarvy supplemented with Bactrim for OI prophylaxis due to CD4 count <200. Clinic orientation provided. He met with our pharmacy staff for medication assistance and will be able to start his medications today. Plan for follow up in 1 month or sooner if need and will recheck blood work at that time.       Relevant Medications   sulfamethoxazole-trimethoprim (BACTRIM DS) 800-160 MG tablet   bictegravir-emtricitabine-tenofovir AF (  BIKTARVY) 50-200-25 MG TABS tablet   Healthcare  maintenance     Influenza vaccination updated today.  Discussed importance of safe sexual practice to reduce risk of STI. Most recent testing negative. Declines condoms.       Other Visit Diagnoses    Need for immunization against influenza       Relevant Orders   Flu Vaccine QUAD 36+ mos IM (Completed)       I am having Rico Ala start on sulfamethoxazole-trimethoprim and bictegravir-emtricitabine-tenofovir AF. I am also having him maintain his acetaminophen, carvedilol, furosemide, hydrALAZINE, isosorbide mononitrate, ferrous sulfate, and potassium chloride.   Meds ordered this encounter  Medications   sulfamethoxazole-trimethoprim (BACTRIM DS) 800-160 MG tablet    Sig: Take 1 tablet by mouth daily.    Dispense:  30 tablet    Refill:  2    Order Specific Question:   Supervising Provider    Answer:   Baxter Flattery, CYNTHIA [[id number]]   bictegravir-emtricitabine-tenofovir AF (BIKTARVY) 50-200-25 MG TABS tablet    Sig: Take 1 tablet by mouth daily.    Dispense:  30 tablet    Refill:  2    Order Specific Question:   Supervising Provider    Answer:   Carlyle Basques [[id number]]     Follow-up: Return in about 1 month (around 11/10/2019).    Terri Piedra, MSN, FNP-C Nurse Practitioner [facility] Office phone: [phone] [place] number: [phone]

## 2019-10-10 NOTE — Assessment & Plan Note (Signed)
Establishing with Cardiology for continued care. Most recent EF of 40-45%. Blood pressure borderline today. No signs/symptoms of exacerbation at present. Continue to follow up with cardiology as planned.

## 2019-10-10 NOTE — Telephone Encounter (Signed)
[facility] Patient Advocate Encounter ° °Insurance verification completed.   ° °The patient is uninsured and will need patient assistance for medication. ° °We can complete the application and will need to meet with the patient for signatures and income documentation. ° °Ahmeer Tuman E. Daemyn Gariepy, CPhT °Specialty Pharmacy Patient Advocate °[facility] °Phone: [phone] °Fax:  [phone] ° ° °

## 2019-10-11 ENCOUNTER — Telehealth: Payer: Self-pay

## 2019-10-11 ENCOUNTER — Encounter: Payer: Self-pay | Admitting: Family

## 2019-10-11 NOTE — Telephone Encounter (Signed)
Patient called office today stating he noticed a rash under both arms early this morning. Denies rash anywhere else on body. States that he believes they are hives. States that it does not itch, but does burn a little. Patient is concerned rash is being caused by flu shot or medicine. Patient will try to send a mychart message with pictures of rash. Patient would like to know if he should continue medication at this time. [place]

## 2019-10-11 NOTE — Telephone Encounter (Signed)
Happy to review any MyChart pictures.It certainly could be a reaction to the flu shot, although would be uncommon under just both of his arms. He can try 12.5-25 mg of Benedryl if concerned this is a reaction. Happy to see him back if needed.

## 2019-10-11 NOTE — Telephone Encounter (Signed)
Attempted to call patient to relay message from Terri Piedra, [place]. Left voicemail requesting patient call office back. Still waiting on mychart message from patient with pictures of rash. [place]

## 2019-10-12 ENCOUNTER — Other Ambulatory Visit: Payer: Self-pay

## 2019-10-12 ENCOUNTER — Encounter: Payer: Self-pay | Admitting: Cardiology

## 2019-10-12 ENCOUNTER — Ambulatory Visit (INDEPENDENT_AMBULATORY_CARE_PROVIDER_SITE_OTHER): Payer: Self-pay | Admitting: Family Medicine

## 2019-10-12 VITALS — BP 158/108 | HR 73 | Temp 97.1°F | Ht 73.0 in | Wt 169.0 lb

## 2019-10-12 DIAGNOSIS — I5041 Acute combined systolic (congestive) and diastolic (congestive) heart failure: Secondary | ICD-10-CM

## 2019-10-12 DIAGNOSIS — I160 Hypertensive urgency: Secondary | ICD-10-CM

## 2019-10-12 DIAGNOSIS — B20 Human immunodeficiency virus [HIV] disease: Secondary | ICD-10-CM

## 2019-10-12 NOTE — Patient Instructions (Signed)
Medication Instructions:  Your physician recommends that you continue on your current medications as directed. Please refer to the Current Medication list given to you today.   Labwork: NONE  Testing/Procedures: NONE  Follow-Up: Your physician recommends that you schedule a follow-up appointment in: 2 MONTHS    Any Other Special Instructions Will Be Listed Below (If Applicable).     If you need a refill on your cardiac medications before your next appointment, please call your pharmacy.   

## 2019-10-12 NOTE — Progress Notes (Addendum)
Cardiology Office Note  Date: 10/12/2019   ID: Richard Cox, DOB [birth year], MRN [id number]  PCP:  Patient, No Pcp Per  Cardiologist:  Rozann Lesches, MD Electrophysiologist:  None   Chief Complaint  Patient presents with  . Follow-up    History of Present Illness: Richard Cox is a 38 y.o. male presenting after recent hospital stay with hypertensive urgency.  Patient was supposed to be taking Coreg, hydralazine, Imdur, Lasix at home but had not been taking the medication for the previous 2 months prior to recent admission.  He has a history of acute on chronic HFrEF combined diastolic and systolic dysfunction CHF, hypertension, polysubstance abuse, HIV, noncompliance with medication regimen, tobacco abuse.  Recent echo during hospital obtained showed the LVEF was unchanged at 40 to 45% with severely increased left ventricular hypertrophy.  There has been concern in the past about possible hypertensive cardiomyopathy versus hypertrophic cardiomyopathy or infiltrative disease such as amyloidosis.  There was plan to consider outpatient cardiac MRI and possibly PYP scan with referral to the heart failure clinic. He has had no follow-up.  During recent hospitalization there was concern over possible pericarditis due to elevated ESR of 92 and CRP of 9.6 and pericardial effusion.  Patient's dyspnea and orthopnea resolved with diuresis prior to discharge.  He was discharged on carvedilol 6.25 mg p.o. twice daily, ferrous sulfate 325 mg daily, Lasix 40 mg daily, hydralazine 50 mg 3 times daily, isosorbide mononitrate 30 mg daily, potassium chloride 10 mEq daily  As far as his history of polysubstance abuse including cocaine, alcohol, he continues with alcohol and tobacco abuse, but no recent use of cocaine.  Patient here today stating he feels much better since discharge from hospital.  He denies any dyspnea on exertion, orthopnea, or paroxysmal nocturnal dyspnea.  Denies any lower extremity  edema.  Blood pressure is elevated today on arrival.  Patient states he has not taken his antihypertensive medication today.  Patient states he saw his infectious disease physician 2 days ago.  He states he woke up this morning with a red raised maculopapular rash extending from his right shoulder around to his right anterior axillary area to anterior chest.   Past Medical History:  Diagnosis Date  . Essential hypertension   . HIV infection ([place])   . Noncompliance with medication regimen   . 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 NO PAST SURGERIES      Current Outpatient Medications  Medication Sig Dispense Refill  . acetaminophen (TYLENOL) 325 MG tablet Take 2 tablets (650 mg total) by mouth every 6 (six) hours as needed for mild pain, fever or headache (or Fever >/= 101). 12 tablet 0  . bictegravir-emtricitabine-tenofovir AF (BIKTARVY) 50-200-25 MG TABS tablet Take 1 tablet by mouth daily. 30 tablet 2  . carvedilol (COREG) 6.25 MG tablet Take 1 tablet (6.25 mg total) by mouth 2 (two) times daily with a meal. For BP and Heart 60 tablet 11  . ferrous sulfate 325 (65 FE) MG tablet Take 1 tablet (325 mg total) by mouth daily with breakfast. 30 tablet 3  . furosemide (LASIX) 40 MG tablet Take 1 tablet (40 mg total) by mouth daily. 30 tablet 11  . hydrALAZINE (APRESOLINE) 50 MG tablet Take 1 tablet (50 mg total) by mouth 3 (three) times daily. For Heart and Blood Pressure 90 tablet 11  . isosorbide mononitrate (IMDUR) 30 MG 24 hr tablet Take 1 tablet (30 mg  total) by mouth daily. For Heart and Blood Pressure 30 tablet 11  . potassium chloride (KLOR-CON) 10 MEQ tablet Take 1 tablet (10 mEq total) by mouth daily. 30 tablet 11  . sulfamethoxazole-trimethoprim (BACTRIM DS) 800-160 MG tablet Take 1 tablet by mouth daily. 30 tablet 2   No current facility-administered medications for this visit.    Allergies:  Neosporin [neomycin-bacitracin  zn-polymyx]   Social History: The patient  reports that he has been smoking cigarettes. He has been smoking about 0.25 packs per day. He has never used smokeless tobacco. He reports previous alcohol use. He reports previous drug use. Drug: Marijuana.   Family History: The patient's family history includes Cancer in his father and mother; Diabetes Mellitus II in his mother.   ROS:  Please see the history of present illness. Otherwise, complete review of systems is positive for none.  All other systems are reviewed and negative.   Physical Exam: VS:  BP (!) 158/108   Pulse 73   Temp (!) 97.1 F (36.2 C)   Ht '6\' 1"'  (1.854 m)   Wt 169 lb (76.7 kg)   SpO2 98%   BMI 22.30 kg/m , BMI Body mass index is 22.3 kg/m.  Wt Readings from Last 3 Encounters:  10/12/19 169 lb (76.7 kg)  10/10/19 169 lb (76.7 kg)  09/19/19 165 lb 9.1 oz (75.1 kg)    General: Patient appears comfortable at rest. Neck: Supple, no elevated JVP or carotid bruits, no thyromegaly. Lungs: Clear to auscultation, nonlabored breathing at rest. Cardiac: Regular rate and rhythm, no S3 or significant systolic murmur, no pericardial rub. Abdomen: Soft, nontender, no hepatomegaly, bowel sounds present, no guarding or rebound. Extremities: No pitting edema, distal pulses 2+. Skin: Warm and dry. Musculoskeletal: No kyphosis. Neuropsychiatric: Alert and oriented x3, affect grossly appropriate.  ECG:  An ECG dated September 18, 2019 was personally reviewed today and demonstrated:  Sinus tachycardia biatrial enlargement, heart rate of 104, ST and T wave abnormality, consider inferior ischemia, ST and T wave abnormality, consider anterior lateral ischemia.  Recent Labwork: 03/10/2019: TSH 1.433 09/17/2019: B Natriuretic Peptide 589.0 09/26/2019: ALT 23; AST 28; BUN 22; Creat 1.05; Hemoglobin 11.9; Platelets 273; Potassium 4.5; Sodium 137     Component Value Date/Time   CHOL 164 09/26/2019 0928   TRIG 250 (H) 09/26/2019 0928   HDL  22 (L) 09/26/2019 0928   CHOLHDL 7.5 (H) 09/26/2019 0928   VLDL 69 (H) 09/18/2019 0036   LDLCALC 106 (H) 09/26/2019 0928    Other Studies Reviewed Today: Echocardiogram September 18, 2019  1. Left ventricular ejection fraction, by visual estimation, is 40 to 45%. The left ventricle has mildly decreased function. There is severely increased left ventricular hypertrophy. 2. Left ventricular diastolic parameters are indeterminate. 3. Global right ventricle has normal systolic function.The right ventricular size is normal. Moderately increased right ventricular wall thickness. 4. Left atrial size was moderately dilated. 5. Right atrial size was mildly dilated. 6. Moderate pleural effusion in the left lateral region. 7. Small pericardial effusion. 8. Small to moderate circumferential pericardial effusion. No evidence of tampoande physiology. 9. The mitral valve is normal in structure. No evidence of mitral valve regurgitation. No evidence of mitral stenosis. 10. The tricuspid valve is normal in structure. Tricuspid valve regurgitation is trivial. 11. The aortic valve is tricuspid. Aortic valve regurgitation is not visualized. No evidence of aortic valve sclerosis or stenosis. 12. The pulmonic valve was not well visualized. Pulmonic valve regurgitation is not visualized. 13. Normal  pulmonary artery systolic pressure. 14. The inferior vena cava is normal in size with greater than 50% respiratory variability, suggesting right atrial pressure of 3 mmHg. 15. As with prior studies, the degree of LVH and myocardial appearance could suggest potential infiltrative cardiomyopathy such as amyloidosis. Consider cardiac MRI.  CT angiogram of the chest with contrast for recent diagnosis of CHF IMPRESSION: 1. No evidence of pulmonary embolism. 2. Moderate cardiomegaly with biatrial enlargement and suggestion of left ventricular hypertrophy. 3. Moderate volume pericardial effusion of simple attenuation.  4. Patchy areas of ground-glass opacity with mild septal and peribronchovascular thickening, favored to represent mild pulmonary edema. 5. Small left pleural effusion with adjacent passive atelectatic changes. 6. Few low-attenuation mediastinal and hilar nodes, nonspecific, but favor reactive. 7. Mild circumferential esophageal wall thickening, nonspecific, but  Assessment and Plan:   1. 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 This is complicated by noncompliance with therapy and follow-up. Repeat echocardiogram showed left ventricular ejection fraction 40 to 45% and severe left ventricular hypertrophy.  We have discussed cardiac MRI and possible referral to the heart failure clinic. Patient states he does not have insurance yet.  States he will receive insurance 10 November 2019.  Continue carvedilol 6.25 mg p.o. twice daily, Lasix 40 mg daily, Imdur 30 mg daily, potassium 10 mEq daily. Needs to get home BP cuff to track blood pressure. Follow-up arranged.  2. Hypertensive urgency Complicated by noncompliance. Medical regimen outlined above. He had not taken medication yet today - reinforced compliance with him. Can adjust doses based on BP response with stable use.  3. HIV infection It was recommended patient follow-up with his infectious disease provider for regular follow-up. Recent note reviewed. Patient has a red raised maculopapular rash in a dermatomal distribution.  Advised patient to speak with his infectious disease provider for treatment.  Medication Adjustments/Labs and Tests Ordered: Current medicines are reviewed at length with the patient today.  Concerns regarding medicines are outlined above.    Patient Instructions  Medication Instructions:  Your physician recommends that you continue on your current medications as directed. Please refer to the Current Medication list  given to you today.   Labwork: NONE  Testing/Procedures: NONE  Follow-Up: Your physician recommends that you schedule a follow-up appointment in: 2 MONTHS    Any Other Special Instructions Will Be Listed Below (If Applicable).     If you need a refill on your cardiac medications before your next appointment, please call your pharmacy.          Signed, Levell July, NP 10/12/2019 11:32 AM    [facility], [place], [place] [address] Phone: [phone]; Fax: [phone]

## 2019-10-13 LAB — COMPLETE METABOLIC PANEL WITH GFR
AG Ratio: 0.7 (calc) — ABNORMAL LOW (ref 1.0–2.5)
ALT: 23 U/L (ref 9–46)
AST: 28 U/L (ref 10–40)
Albumin: 3.7 g/dL (ref 3.6–5.1)
Alkaline phosphatase (APISO): 96 U/L (ref 36–130)
BUN: 22 mg/dL (ref 7–25)
CO2: 32 mmol/L (ref 20–32)
Calcium: 9.5 mg/dL (ref 8.6–10.3)
Chloride: 104 mmol/L (ref 98–110)
Creat: 1.05 mg/dL (ref 0.60–1.35)
GFR, Est African American: 105 mL/min/{1.73_m2} (ref >=60)
GFR, Est Non African American: 91 mL/min/{1.73_m2} (ref >=60)
Globulin: 5.6 g/dL (calc) — ABNORMAL HIGH (ref 1.9–3.7)
Glucose, Bld: 82 mg/dL (ref 65–99)
Potassium: 4.5 mmol/L (ref 3.5–5.3)
Sodium: 137 mmol/L (ref 135–146)
Total Bilirubin: 0.3 mg/dL (ref 0.2–1.2)
Total Protein: 9.3 g/dL — ABNORMAL HIGH (ref 6.1–8.1)

## 2019-10-13 LAB — CBC WITH DIFFERENTIAL/PLATELET
Absolute Monocytes: 421 cells/uL (ref 200–950)
Basophils Absolute: 0 cells/uL (ref 0–200)
Basophils Relative: 0 %
Eosinophils Absolute: 22 cells/uL (ref 15–500)
Eosinophils Relative: 0.5 %
HCT: 36.8 % — ABNORMAL LOW (ref 38.5–50.0)
Hemoglobin: 11.9 g/dL — ABNORMAL LOW (ref 13.2–17.1)
Lymphs Abs: 2021 cells/uL (ref 850–3900)
MCH: 27.5 pg (ref 27.0–33.0)
MCHC: 32.3 g/dL (ref 32.0–36.0)
MCV: 85 fL (ref 80.0–100.0)
MPV: 10 fL (ref 7.5–12.5)
Monocytes Relative: 9.8 %
Neutro Abs: 1836 cells/uL (ref 1500–7800)
Neutrophils Relative %: 42.7 %
Platelets: 273 10*3/uL (ref 140–400)
RBC: 4.33 10*6/uL (ref 4.20–5.80)
RDW: 13.2 % (ref 11.0–15.0)
Total Lymphocyte: 47 %
WBC: 4.3 10*3/uL (ref 3.8–10.8)

## 2019-10-13 LAB — QUANTIFERON-TB GOLD PLUS
Mitogen-NIL: 6.06 IU/mL
NIL: 0.07 IU/mL
QuantiFERON-TB Gold Plus: NEGATIVE
TB1-NIL: 0.03 IU/mL
TB2-NIL: 0.03 IU/mL

## 2019-10-13 LAB — HIV-1/2 AB - DIFFERENTIATION
HIV-1 antibody: POSITIVE — AB
HIV-2 Ab: NEGATIVE

## 2019-10-13 LAB — HIV-1 RNA ULTRAQUANT REFLEX TO GENTYP+
HIV 1 RNA Quant: 302000 copies/mL — ABNORMAL HIGH
HIV-1 RNA Quant, Log: 5.48 Log copies/mL — ABNORMAL HIGH

## 2019-10-13 LAB — HEPATITIS B CORE ANTIBODY, TOTAL: Hep B Core Total Ab: NONREACTIVE

## 2019-10-13 LAB — HIV-1 GENOTYPE: HIV-1 Genotype: DETECTED — AB

## 2019-10-13 LAB — LIPID PANEL
Cholesterol: 164 mg/dL (ref <200)
HDL: 22 mg/dL — ABNORMAL LOW (ref >=40)
LDL Cholesterol (Calc): 106 mg/dL (calc) — ABNORMAL HIGH
Non-HDL Cholesterol (Calc): 142 mg/dL (calc) — ABNORMAL HIGH (ref <130)
Total CHOL/HDL Ratio: 7.5 (calc) — ABNORMAL HIGH (ref <5.0)
Triglycerides: 250 mg/dL — ABNORMAL HIGH (ref <150)

## 2019-10-13 LAB — RPR: RPR Ser Ql: NONREACTIVE

## 2019-10-13 LAB — HEPATITIS A ANTIBODY, TOTAL: Hepatitis A AB,Total: NONREACTIVE

## 2019-10-13 LAB — HEPATITIS C ANTIBODY
Hepatitis C Ab: NONREACTIVE
SIGNAL TO CUT-OFF: 0.25 (ref <1.00)

## 2019-10-13 LAB — HIV ANTIBODY (ROUTINE TESTING W REFLEX): HIV 1&2 Ab, 4th Generation: REACTIVE — AB

## 2019-10-13 LAB — HLA B*5701: HLA-B*5701 w/rflx HLA-B High: NEGATIVE

## 2019-10-13 LAB — HEPATITIS B SURFACE ANTIBODY,QUALITATIVE: Hep B S Ab: NONREACTIVE

## 2019-10-13 LAB — HEPATITIS B SURFACE ANTIGEN: Hepatitis B Surface Ag: NONREACTIVE

## 2019-10-13 NOTE — Telephone Encounter (Signed)
Continue with Benedryl as needed. Steroid creams are okay if necessary. Should slowly resolve the further away from flu shot if truly reaction.

## 2019-10-13 NOTE — Telephone Encounter (Signed)
Patient states he was not able to upload MyChart photos due to file being too large advised patient to go to phone settings and adjust size of photos and try again. Patient didn't not seem to sure about advise. Patient states the rash has eased a bit with benadryl, states its just uncomfortable. Patient states he saw cardiology yesterday and they wanted to prescribe him a cream but was unsure if it would have any adverse reactions with current medications and advised him to contact ID. (patient could not remember the name of the cream)  Eugenia Mcalpine

## 2019-10-13 NOTE — Telephone Encounter (Signed)
Attempted to call patient re: Greg's recommendation. No voicemail left. Sent patient a Therapist, music. Eugenia Mcalpine

## 2019-11-06 MED FILL — BIKTARVY 50-200-25 MG TABS: 50-200-25 | 30 days supply | Qty: 30 | Fill #1

## 2019-11-06 MED FILL — SULFAMETHOXAZOLE-TMP DS TAB: 800-160 | 30 days supply | Qty: 30 | Fill #1

## 2019-11-14 ENCOUNTER — Ambulatory Visit: Payer: Self-pay | Admitting: Family

## 2019-11-17 ENCOUNTER — Ambulatory Visit: Payer: Self-pay | Admitting: Family

## 2019-12-04 MED FILL — BIKTARVY 50-200-25 MG TABS: 50-200-25 | 30 days supply | Qty: 30 | Fill #2

## 2019-12-04 MED FILL — SULFAMETHOXAZOLE-TMP DS TAB: 800-160 | 30 days supply | Qty: 30 | Fill #2

## 2019-12-06 ENCOUNTER — Telehealth: Payer: Self-pay | Admitting: Cardiology

## 2019-12-06 NOTE — Telephone Encounter (Signed)
Richard Cox, did we receive any [employer] paperwork for pt.

## 2019-12-06 NOTE — Telephone Encounter (Signed)
Needs to verify dates that they patient was taken out of work

## 2019-12-07 NOTE — Telephone Encounter (Signed)
Everything for the pt was sent to [facility] back in December

## 2019-12-08 NOTE — Telephone Encounter (Signed)
Returned call to [employer] Glendell Docker) concerning pt's dates taken out of work. Left him a detailed voicemail to call [employer] @ [phone] to obtain information.

## 2019-12-19 ENCOUNTER — Ambulatory Visit: Payer: Self-pay | Admitting: Cardiology

## 2020-01-01 ENCOUNTER — Other Ambulatory Visit: Payer: Self-pay | Admitting: Family

## 2020-01-01 DIAGNOSIS — B20 Human immunodeficiency virus [HIV] disease: Secondary | ICD-10-CM

## 2020-01-03 MED FILL — BIKTARVY 50-200-25 MG TABS: 50-200-25 | 30 days supply | Qty: 30 | Fill #0

## 2020-01-04 ENCOUNTER — Ambulatory Visit: Payer: Self-pay

## 2020-01-04 ENCOUNTER — Ambulatory Visit (INDEPENDENT_AMBULATORY_CARE_PROVIDER_SITE_OTHER): Payer: Self-pay | Admitting: Family

## 2020-01-04 ENCOUNTER — Other Ambulatory Visit: Payer: Self-pay

## 2020-01-04 ENCOUNTER — Encounter: Payer: Self-pay | Admitting: Family

## 2020-01-04 VITALS — BP 132/77 | HR 70 | Temp 98.3°F | Ht 73.0 in | Wt 162.0 lb

## 2020-01-04 DIAGNOSIS — B20 Human immunodeficiency virus [HIV] disease: Secondary | ICD-10-CM

## 2020-01-04 DIAGNOSIS — B019 Varicella without complication: Secondary | ICD-10-CM

## 2020-01-04 DIAGNOSIS — B029 Zoster without complications: Secondary | ICD-10-CM | POA: Insufficient documentation

## 2020-01-04 DIAGNOSIS — Z0000 Encounter for general adult medical examination without abnormal findings: Secondary | ICD-10-CM

## 2020-01-04 MED ORDER — BIKTARVY 50-200-25 MG PO TABS: 1.0000 | ORAL_TABLET | Freq: Every day | ORAL | 2 refills | Status: DC

## 2020-01-04 MED ORDER — SULFAMETHOXAZOLE-TRIMETHOPRIM 800-160 MG PO TABS: 1.0000 | ORAL_TABLET | Freq: Every day | ORAL | 2 refills | Status: DC

## 2020-01-04 MED FILL — SULFAMETHOXAZOLE-TMP DS TAB: 800-160 | 30 days supply | Qty: 30 | Fill #0

## 2020-01-04 NOTE — Progress Notes (Signed)
Subjective:    Patient ID: Richard Cox, male    DOB: 05/19/1981, 39 y.o.   MRN: [id number]  Chief Complaint  Patient presents with  . Follow-up     HPI:  Richard Cox is a 39 y.o. male was last seen in the office on 10/10/2019 for initial entry into HIV care with blood work at the time showing a viral load of 302,000 and CD4 count of 187.  He was started on Biktarvy supplemented with Bactrim for OI prophylaxis. His 1 month follow up was cancelled due to weather. No recent blood work has been completed.   Richard Cox continues to take his Biktarvy and Bactrim as prescribed with no adverse side effects or missed doses since his last office visit.  He has noted since then that he developed a rash following his influenza vaccination located along the right side of his chest which has since improved.  Otherwise, no current concerns/complaints. Denies fevers, chills, night sweats, headaches, changes in vision, neck pain/stiffness, nausea, diarrhea, vomiting, lesions or rashes.  Richard Cox has no problems obtaining his medication from the pharmacy and is covered through [employer] through December 2021.  He is over the income range for other financial assistance at this time.  Denies any feelings of being down, depressed, or hopeless recently.  He does smoke tobacco some days.  No recreational or illicit drug use or alcohol consumption.  He is not currently sexually active and declines condoms.  Does not wish to receive vaccinations today.   Allergies  Allergen Reactions  . Neosporin [Neomycin-Bacitracin Zn-Polymyx] Rash      Outpatient Medications Prior to Visit  Medication Sig Dispense Refill  . acetaminophen (TYLENOL) 325 MG tablet Take 2 tablets (650 mg total) by mouth every 6 (six) hours as needed for mild pain, fever or headache (or Fever >/= 101). 12 tablet 0  . carvedilol (COREG) 6.25 MG tablet Take 1 tablet (6.25 mg total) by mouth 2 (two) times daily with a meal. For BP and Heart 60  tablet 11  . BIKTARVY 50-200-25 MG TABS tablet TAKE 1 TABLET BY MOUTH DAILY. 30 tablet 2  . sulfamethoxazole-trimethoprim (BACTRIM DS) 800-160 MG tablet Take 1 tablet by mouth daily. 30 tablet 2  . ferrous sulfate 325 (65 FE) MG tablet Take 1 tablet (325 mg total) by mouth daily with breakfast. 30 tablet 3  . furosemide (LASIX) 40 MG tablet Take 1 tablet (40 mg total) by mouth daily. 30 tablet 11  . hydrALAZINE (APRESOLINE) 50 MG tablet Take 1 tablet (50 mg total) by mouth 3 (three) times daily. For Heart and Blood Pressure 90 tablet 11  . isosorbide mononitrate (IMDUR) 30 MG 24 hr tablet Take 1 tablet (30 mg total) by mouth daily. For Heart and Blood Pressure 30 tablet 11  . potassium chloride (KLOR-CON) 10 MEQ tablet Take 1 tablet (10 mEq total) by mouth daily. 30 tablet 11   No facility-administered medications prior to visit.     Past Medical History:  Diagnosis Date  . Essential hypertension   . HIV infection ([place])   . Noncompliance with medication regimen   . 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 NO PAST SURGERIES       Review of Systems  Constitutional: Negative for appetite change, chills, fatigue, fever and unexpected weight change.  Eyes: Negative for visual disturbance.  Respiratory: Negative for cough, chest tightness, shortness of breath and wheezing.  Cardiovascular: Negative for chest pain and leg swelling.  Gastrointestinal: Negative for abdominal pain, constipation, diarrhea, nausea and vomiting.  Genitourinary: Negative for dysuria, flank pain, frequency, genital sores, hematuria and urgency.  Skin: Positive for rash.  Allergic/Immunologic: Negative for immunocompromised state.  Neurological: Negative for dizziness and headaches.      Objective:    BP 132/77   Pulse 70   Temp 98.3 F (36.8 C)   Ht 6\' 1"  (1.854 m)   Wt 162 lb (73.5 kg)   SpO2 99%   BMI 21.37 kg/m  Nursing note and vital signs  reviewed.  Physical Exam Constitutional:      General: He is not in acute distress.    Appearance: He is well-developed.  Eyes:     Conjunctiva/sclera: Conjunctivae normal.  Cardiovascular:     Rate and Rhythm: Normal rate and regular rhythm.     Heart sounds: Normal heart sounds. No murmur. No friction rub. No gallop.   Pulmonary:     Effort: Pulmonary effort is normal. No respiratory distress.     Breath sounds: Normal breath sounds. No wheezing or rales.  Chest:     Chest wall: No tenderness.  Abdominal:     General: Bowel sounds are normal.     Palpations: Abdomen is soft.     Tenderness: There is no abdominal tenderness.  Musculoskeletal:     Cervical back: Neck supple.  Lymphadenopathy:     Cervical: No cervical adenopathy.  Skin:    General: Skin is warm and dry.     Findings: No rash.     Comments: He has a brownish discoloration rash in a dermatomal pattern located on his right upper chest with no lesions or drainage.  Neurological:     Mental Status: He is alert and oriented to person, place, and time.  Psychiatric:        Behavior: Behavior normal.        Thought Content: Thought content normal.        Judgment: Judgment normal.      Depression screen PHQ 2/9 10/10/2019  Decreased Interest 0  Down, Depressed, Hopeless 1  PHQ - 2 Score 1       Assessment & Plan:    Patient Active Problem List   Diagnosis Date Noted  . Varicella zoster 01/04/2020  . AIDS (acquired immune deficiency syndrome) ([place]) 10/10/2019  . Healthcare maintenance 10/10/2019  . Acute on chronic HFrEF (heart failure with reduced ejection fraction) -combined diastolic and systolic dysfunction CHF Q000111Q  . HIV (human immunodeficiency virus infection) ([place]) 09/18/2019  . Polysubstance abuse -cocaine use in remission 09/18/2019  . Alcohol abuse 09/18/2019  . Tobacco abuse 09/18/2019  . Nonspecific serologic evidence of human immunodeficiency virus (HIV) 03/13/2019  . Acute  exacerbation of CHF (congestive heart failure) ([place]) 03/10/2019  . Hypertensive urgency 03/10/2019  . Congestive heart failure ([place]) 03/10/2019  . SOB (shortness of breath) 03/10/2019  . Noncompliance with medication regimen 03/10/2019     Problem List Items Addressed This Visit      Other   AIDS (acquired immune deficiency syndrome) ([place]) - Primary    Mr. Gentz appears to be doing well with his current ART regimen with good adherence and tolerance to Biktarvy supplemented with Bactrim for OI prophylaxis.  No signs/symptoms of opportunistic infection or progressive HIV disease.  We reviewed his previous lab work and discussed the plan of care.  He has no problems obtaining his medication from the pharmacy.  Check blood  work today and anticipate him being undetectable.  If CD4 count is greater than 200 we will consider discontinuation of Bactrim at the end of March.  Continue current dose of Biktarvy supplemented with Bactrim for OI prophylaxis.  Plan for follow-up in 2 months or sooner if needed with lab work 1 to 2 weeks prior to appointment.      Relevant Medications   bictegravir-emtricitabine-tenofovir AF (BIKTARVY) 50-200-25 MG TABS tablet   sulfamethoxazole-trimethoprim (BACTRIM DS) 800-160 MG tablet   Other Relevant Orders   COMPLETE METABOLIC PANEL WITH GFR   HIV-1 RNA quant-no reflex-bld   T-helper cell (CD4)- ([facility] clinic only)   Healthcare maintenance     Declines vaccinations today.  Discussed importance of safe sexual practice to reduce risk of STI.  Condoms declined.      Varicella zoster    Mr. Mundine has a rash consistent with resolving varicella-zoster.  No additional treatment necessary at this time.  We discussed future breakouts and potential need for medication if these occur.  No current pain or itching.  Continue to monitor.      Relevant Medications   bictegravir-emtricitabine-tenofovir AF (BIKTARVY) 50-200-25 MG TABS tablet   sulfamethoxazole-trimethoprim  (BACTRIM DS) 800-160 MG tablet       I have discontinued Levada Dy Bradt's hydrALAZINE, isosorbide mononitrate, and potassium chloride. I have also changed his Biktarvy. Additionally, I am having him maintain his acetaminophen, carvedilol, furosemide, ferrous sulfate, and sulfamethoxazole-trimethoprim.   Meds ordered this encounter  Medications  . bictegravir-emtricitabine-tenofovir AF (BIKTARVY) 50-200-25 MG TABS tablet    Sig: Take 1 tablet by mouth daily.    Dispense:  30 tablet    Refill:  2    Order Specific Question:   Supervising Provider    Answer:   Carlyle Basques [[id number]]  . sulfamethoxazole-trimethoprim (BACTRIM DS) 800-160 MG tablet    Sig: Take 1 tablet by mouth daily.    Dispense:  30 tablet    Refill:  2    Order Specific Question:   Supervising Provider    Answer:   Carlyle Basques [[id number]]     Follow-up: Return in about 2 months (around 03/03/2020), or if symptoms worsen or fail to improve.   Terri Piedra, MSN, FNP-C Nurse Practitioner [facility] for Infectious Disease [place] number: [phone]

## 2020-01-04 NOTE — Patient Instructions (Signed)
Nice to see you.  Please continue to take your Biktarvy and Bactrim as prescribed daily.  If your blood work looks okay today with CD4 count we will discontinue your Bactrim at the end of March.  We will check your blood work today.  Refills of medication of been sent to the pharmacy.  Let me know if you have any other additional questions or concerns.  Plan for follow-up in 2 months or sooner if needed with lab work 1 to 2 weeks prior to your appointment.  Have a great day and stay safe.

## 2020-01-04 NOTE — Assessment & Plan Note (Signed)
   Declines vaccinations today.  Discussed importance of safe sexual practice to reduce risk of STI.  Condoms declined.

## 2020-01-04 NOTE — Assessment & Plan Note (Signed)
Richard Cox has a rash consistent with resolving varicella-zoster.  No additional treatment necessary at this time.  We discussed future breakouts and potential need for medication if these occur.  No current pain or itching.  Continue to monitor.

## 2020-01-04 NOTE — Assessment & Plan Note (Signed)
Richard Cox appears to be doing well with his current ART regimen with good adherence and tolerance to Biktarvy supplemented with Bactrim for OI prophylaxis.  No signs/symptoms of opportunistic infection or progressive HIV disease.  We reviewed his previous lab work and discussed the plan of care.  He has no problems obtaining his medication from the pharmacy.  Check blood work today and anticipate him being undetectable.  If CD4 count is greater than 200 we will consider discontinuation of Bactrim at the end of March.  Continue current dose of Biktarvy supplemented with Bactrim for OI prophylaxis.  Plan for follow-up in 2 months or sooner if needed with lab work 1 to 2 weeks prior to appointment.

## 2020-01-05 LAB — T-HELPER CELL (CD4) - (RCID CLINIC ONLY)
CD4 % Helper T Cell: 16 % — ABNORMAL LOW (ref 33–65)
CD4 T Cell Abs: 331 /uL — ABNORMAL LOW (ref 400–1790)

## 2020-01-08 ENCOUNTER — Ambulatory Visit: Payer: Self-pay | Admitting: Cardiology

## 2020-01-08 ENCOUNTER — Encounter: Payer: Self-pay | Admitting: Cardiology

## 2020-01-08 LAB — COMPLETE METABOLIC PANEL WITH GFR
AG Ratio: 1.2 (calc) (ref 1.0–2.5)
ALT: 20 U/L (ref 9–46)
AST: 19 U/L (ref 10–40)
Albumin: 4.1 g/dL (ref 3.6–5.1)
Alkaline phosphatase (APISO): 68 U/L (ref 36–130)
BUN: 19 mg/dL (ref 7–25)
CO2: 29 mmol/L (ref 20–32)
Calcium: 9.1 mg/dL (ref 8.6–10.3)
Chloride: 104 mmol/L (ref 98–110)
Creat: 1.2 mg/dL (ref 0.60–1.35)
GFR, Est African American: 90 mL/min/{1.73_m2} (ref >=60)
GFR, Est Non African American: 77 mL/min/{1.73_m2} (ref >=60)
Globulin: 3.5 g/dL (calc) (ref 1.9–3.7)
Glucose, Bld: 64 mg/dL — ABNORMAL LOW (ref 65–99)
Potassium: 4 mmol/L (ref 3.5–5.3)
Sodium: 138 mmol/L (ref 135–146)
Total Bilirubin: 0.3 mg/dL (ref 0.2–1.2)
Total Protein: 7.6 g/dL (ref 6.1–8.1)

## 2020-01-08 LAB — HIV-1 RNA QUANT-NO REFLEX-BLD
HIV 1 RNA Quant: 35 copies/mL — ABNORMAL HIGH
HIV-1 RNA Quant, Log: 1.54 Log copies/mL — ABNORMAL HIGH

## 2020-02-01 MED FILL — SULFAMETHOXAZOLE-TMP DS TAB: 800-160 | 30 days supply | Qty: 30 | Fill #0

## 2020-02-01 MED FILL — BIKTARVY 50-200-25 MG TABS: 50-200-25 | 30 days supply | Qty: 30 | Fill #1

## 2020-02-20 ENCOUNTER — Other Ambulatory Visit: Payer: Self-pay

## 2020-03-04 ENCOUNTER — Encounter: Payer: Self-pay | Admitting: Family

## 2020-03-04 MED FILL — SULFAMETHOXAZOLE-TMP DS TAB: 800-160 | 30 days supply | Qty: 30 | Fill #1

## 2020-03-04 MED FILL — BIKTARVY 50-200-25 MG TABS: 50-200-25 | 30 days supply | Qty: 30 | Fill #2

## 2020-03-06 ENCOUNTER — Other Ambulatory Visit: Payer: Self-pay

## 2020-03-06 DIAGNOSIS — B20 Human immunodeficiency virus [HIV] disease: Secondary | ICD-10-CM

## 2020-03-06 DIAGNOSIS — Z113 Encounter for screening for infections with a predominantly sexual mode of transmission: Secondary | ICD-10-CM

## 2020-03-06 NOTE — Addendum Note (Signed)
Addended by: Monia Sabal on: 03/06/2020 09:31 AM   Modules accepted: Orders

## 2020-03-07 LAB — URINE CYTOLOGY ANCILLARY ONLY
Chlamydia: NEGATIVE
Comment: NEGATIVE
Comment: NORMAL
Neisseria Gonorrhea: NEGATIVE

## 2020-03-07 LAB — T-HELPER CELLS (CD4) COUNT (NOT AT ARMC)
CD4 % Helper T Cell: 18 % — ABNORMAL LOW (ref 33–65)
CD4 T Cell Abs: 303 /uL — ABNORMAL LOW (ref 400–1790)

## 2020-03-08 LAB — CBC WITH DIFFERENTIAL/PLATELET
Absolute Monocytes: 252 cells/uL (ref 200–950)
Basophils Absolute: 19 cells/uL (ref 0–200)
Basophils Relative: 0.5 %
Eosinophils Absolute: 41 cells/uL (ref 15–500)
Eosinophils Relative: 1.1 %
HCT: 37.5 % — ABNORMAL LOW (ref 38.5–50.0)
Hemoglobin: 12.7 g/dL — ABNORMAL LOW (ref 13.2–17.1)
Lymphs Abs: 1669 cells/uL (ref 850–3900)
MCH: 30.6 pg (ref 27.0–33.0)
MCHC: 33.9 g/dL (ref 32.0–36.0)
MCV: 90.4 fL (ref 80.0–100.0)
MPV: 9.6 fL (ref 7.5–12.5)
Monocytes Relative: 6.8 %
Neutro Abs: 1721 cells/uL (ref 1500–7800)
Neutrophils Relative %: 46.5 %
Platelets: 157 10*3/uL (ref 140–400)
RBC: 4.15 10*6/uL — ABNORMAL LOW (ref 4.20–5.80)
RDW: 14.5 % (ref 11.0–15.0)
Total Lymphocyte: 45.1 %
WBC: 3.7 10*3/uL — ABNORMAL LOW (ref 3.8–10.8)

## 2020-03-08 LAB — COMPREHENSIVE METABOLIC PANEL
AG Ratio: 1.1 (calc) (ref 1.0–2.5)
ALT: 11 U/L (ref 9–46)
AST: 19 U/L (ref 10–40)
Albumin: 4.2 g/dL (ref 3.6–5.1)
Alkaline phosphatase (APISO): 70 U/L (ref 36–130)
BUN: 22 mg/dL (ref 7–25)
CO2: 28 mmol/L (ref 20–32)
Calcium: 9.5 mg/dL (ref 8.6–10.3)
Chloride: 105 mmol/L (ref 98–110)
Creat: 1.26 mg/dL (ref 0.60–1.35)
Globulin: 3.8 g/dL (calc) — ABNORMAL HIGH (ref 1.9–3.7)
Glucose, Bld: 87 mg/dL (ref 65–99)
Potassium: 4.4 mmol/L (ref 3.5–5.3)
Sodium: 138 mmol/L (ref 135–146)
Total Bilirubin: 0.3 mg/dL (ref 0.2–1.2)
Total Protein: 8 g/dL (ref 6.1–8.1)

## 2020-03-08 LAB — HIV-1 RNA QUANT-NO REFLEX-BLD
HIV 1 RNA Quant: <20 copies/mL — AB
HIV-1 RNA Quant, Log: <1.3 Log copies/mL — AB

## 2020-03-08 LAB — RPR: RPR Ser Ql: NONREACTIVE

## 2020-03-29 ENCOUNTER — Encounter: Payer: Self-pay | Admitting: Family

## 2020-04-02 ENCOUNTER — Other Ambulatory Visit: Payer: Self-pay | Admitting: Family

## 2020-04-02 DIAGNOSIS — B20 Human immunodeficiency virus [HIV] disease: Secondary | ICD-10-CM

## 2020-04-02 MED FILL — SULFAMETHOXAZOLE-TMP DS TAB: 800-160 | 30 days supply | Qty: 30 | Fill #2

## 2020-04-02 MED FILL — BIKTARVY 50-200-25 MG TABS: 50-200-25 | 30 days supply | Qty: 30 | Fill #0

## 2020-04-04 ENCOUNTER — Encounter: Payer: Self-pay | Admitting: Family

## 2020-04-04 ENCOUNTER — Ambulatory Visit (INDEPENDENT_AMBULATORY_CARE_PROVIDER_SITE_OTHER): Payer: Self-pay | Admitting: Family

## 2020-04-04 ENCOUNTER — Other Ambulatory Visit: Payer: Self-pay

## 2020-04-04 ENCOUNTER — Other Ambulatory Visit: Payer: Self-pay | Admitting: Family

## 2020-04-04 VITALS — BP 137/84 | HR 83 | Temp 98.4°F | Wt 161.4 lb

## 2020-04-04 DIAGNOSIS — Z113 Encounter for screening for infections with a predominantly sexual mode of transmission: Secondary | ICD-10-CM

## 2020-04-04 DIAGNOSIS — B20 Human immunodeficiency virus [HIV] disease: Secondary | ICD-10-CM

## 2020-04-04 DIAGNOSIS — Z0000 Encounter for general adult medical examination without abnormal findings: Secondary | ICD-10-CM

## 2020-04-04 DIAGNOSIS — Z720 Tobacco use: Secondary | ICD-10-CM

## 2020-04-04 MED ORDER — BIKTARVY 50-200-25 MG PO TABS: 1.0000 | ORAL_TABLET | Freq: Every day | ORAL | 5 refills | Status: DC

## 2020-04-04 NOTE — Assessment & Plan Note (Signed)
Richard Cox continues to have well-controlled HIV/AIDS with good adherence and tolerance to his ART regimen of Biktarvy and supplemented with Bactrim for OI prophylaxis.  No signs/symptoms of opportunistic infection or progressive HIV disease.  Based on previous blood work it is okay to stop Bactrim as his CD4 count is greater than 200 for more than 3 months.  We reviewed previous lab work and discussed the plan of care.  Continue current dose of Biktarvy.  Plan for follow-up in 3 months or sooner if needed with lab work 1 to 2 weeks prior to appointment.

## 2020-04-04 NOTE — Assessment & Plan Note (Signed)
   Discussed Covid vaccine and will hold other vaccines today.  Discussed importance of safe sexual practice to reduce risk of STI.  Condoms provided.

## 2020-04-04 NOTE — Assessment & Plan Note (Signed)
Richard Cox continues to smoke approximately 1 to 2 cigarettes/day on average.  Discussed importance of tobacco cessation to reduce risk of cardiovascular, malignant, and respiratory disease in the future.  He is in the precontemplation stage of quitting.  Continue to monitor.

## 2020-04-04 NOTE — Progress Notes (Signed)
Subjective:    Patient ID: Richard Cox, male    DOB: 20-Apr-1981, 39 y.o.   MRN: [id number]  Chief Complaint  Patient presents with  . HIV Positive/AIDS     HPI:  Richard Cox is a 39 y.o. male with HIV/AIDS last seen in the office on 01/04/2020 for routine follow-up with good adherence and tolerance to his ART regimen of Biktarvy supplemented with Bactrim for OI prophylaxis.  Viral load at the time was 35 and undetectable with CD4 count of 331.  Most recent blood work completed on 03/06/2020 with viral load that remains undetectable and CD4 count of 303.  RPR nonreactive for syphilis and urine negative for gonorrhea and chlamydia.  Renal function, hepatic function, and electrolytes within normal ranges.  Healthcare maintenance due includes Prevnar, Pneumovax, tetanus, and Covid vaccinations.  Richard Cox continues to take his Biktarvy and Bactrim daily as prescribed with no adverse side effects or missed doses since his last office visit.  Overall feeling well today.  Does have loose stools on occasion which are dependent upon his nutritional intake. Denies fevers, chills, night sweats, headaches, changes in vision, neck pain/stiffness, nausea, vomiting, lesions or rashes.  Richard Cox has no problems obtaining his medication from the pharmacy.  Denies feelings of being down, depressed, or hopeless recently.  No recreational or illicit drug use or alcohol consumption.  He does smoke approximately 1 to 2 cigarettes/day on average.  Not currently sexually active.   Allergies  Allergen Reactions  . Neosporin [Neomycin-Bacitracin Zn-Polymyx] Rash      Outpatient Medications Prior to Visit  Medication Sig Dispense Refill  . acetaminophen (TYLENOL) 325 MG tablet Take 2 tablets (650 mg total) by mouth every 6 (six) hours as needed for mild pain, fever or headache (or Fever >/= 101). 12 tablet 0  . carvedilol (COREG) 6.25 MG tablet Take 1 tablet (6.25 mg total) by mouth 2 (two) times daily with a meal.  For BP and Heart 60 tablet 11  . ferrous sulfate 325 (65 FE) MG tablet Take 1 tablet (325 mg total) by mouth daily with breakfast. 30 tablet 3  . furosemide (LASIX) 40 MG tablet Take 1 tablet (40 mg total) by mouth daily. 30 tablet 11  . bictegravir-emtricitabine-tenofovir AF (BIKTARVY) 50-200-25 MG TABS tablet Take 1 tablet by mouth daily. 30 tablet 2  . sulfamethoxazole-trimethoprim (BACTRIM DS) 800-160 MG tablet Take 1 tablet by mouth daily. 30 tablet 2   No facility-administered medications prior to visit.     Past Medical History:  Diagnosis Date  . Essential hypertension   . HIV infection ([place])   . Noncompliance with medication regimen   . 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 NO PAST SURGERIES      Review of Systems  Constitutional: Negative for appetite change, chills, fatigue, fever and unexpected weight change.  Eyes: Negative for visual disturbance.  Respiratory: Negative for cough, chest tightness, shortness of breath and wheezing.   Cardiovascular: Negative for chest pain and leg swelling.  Gastrointestinal: Negative for abdominal pain, constipation, diarrhea, nausea and vomiting.  Genitourinary: Negative for dysuria, flank pain, frequency, genital sores, hematuria and urgency.  Skin: Negative for rash.  Allergic/Immunologic: Negative for immunocompromised state.  Neurological: Negative for dizziness and headaches.      Objective:    BP 137/84   Pulse 83   Temp 98.4 F (36.9 C) (Oral)   Wt 161 lb 6.4 oz (73.2 kg)  BMI 21.29 kg/m  Nursing note and vital signs reviewed.  Physical Exam Constitutional:      General: He is not in acute distress.    Appearance: He is well-developed.  Eyes:     Conjunctiva/sclera: Conjunctivae normal.  Cardiovascular:     Rate and Rhythm: Normal rate and regular rhythm.     Heart sounds: Normal heart sounds. No murmur. No friction rub. No gallop.   Pulmonary:      Effort: Pulmonary effort is normal. No respiratory distress.     Breath sounds: Normal breath sounds. No wheezing or rales.  Chest:     Chest wall: No tenderness.  Abdominal:     General: Bowel sounds are normal.     Palpations: Abdomen is soft.     Tenderness: There is no abdominal tenderness.  Musculoskeletal:     Cervical back: Neck supple.  Lymphadenopathy:     Cervical: No cervical adenopathy.  Skin:    General: Skin is warm and dry.     Findings: No rash.  Neurological:     Mental Status: He is alert and oriented to person, place, and time.  Psychiatric:        Behavior: Behavior normal.        Thought Content: Thought content normal.        Judgment: Judgment normal.      Depression screen PHQ 2/9 10/10/2019  Decreased Interest 0  Down, Depressed, Hopeless 1  PHQ - 2 Score 1       Assessment & Plan:    Patient Active Problem List   Diagnosis Date Noted  . Varicella zoster 01/04/2020  . AIDS (acquired immune deficiency syndrome) ([place]) 10/10/2019  . Healthcare maintenance 10/10/2019  . Acute on chronic HFrEF (heart failure with reduced ejection fraction) -combined diastolic and systolic dysfunction CHF Q000111Q  . HIV (human immunodeficiency virus infection) ([place]) 09/18/2019  . Polysubstance abuse -cocaine use in remission 09/18/2019  . Alcohol abuse 09/18/2019  . Tobacco abuse 09/18/2019  . Nonspecific serologic evidence of human immunodeficiency virus (HIV) 03/13/2019  . Acute exacerbation of CHF (congestive heart failure) ([place]) 03/10/2019  . Hypertensive urgency 03/10/2019  . Congestive heart failure ([place]) 03/10/2019  . SOB (shortness of breath) 03/10/2019  . Noncompliance with medication regimen 03/10/2019     Problem List Items Addressed This Visit      Other   Tobacco abuse    Richard Cox continues to smoke approximately 1 to 2 cigarettes/day on average.  Discussed importance of tobacco cessation to reduce risk of cardiovascular, malignant, and  respiratory disease in the future.  He is in the precontemplation stage of quitting.  Continue to monitor.      AIDS (acquired immune deficiency syndrome) ([place]) - Primary    Richard Cox continues to have well-controlled HIV/AIDS with good adherence and tolerance to his ART regimen of Biktarvy and supplemented with Bactrim for OI prophylaxis.  No signs/symptoms of opportunistic infection or progressive HIV disease.  Based on previous blood work it is okay to stop Bactrim as his CD4 count is greater than 200 for more than 3 months.  We reviewed previous lab work and discussed the plan of care.  Continue current dose of Biktarvy.  Plan for follow-up in 3 months or sooner if needed with lab work 1 to 2 weeks prior to appointment.      Relevant Medications   bictegravir-emtricitabine-tenofovir AF (BIKTARVY) 50-200-25 MG TABS tablet   Other Relevant Orders   HIV-1 RNA quant-no reflex-bld  T-helper cell (CD4)- ([facility] clinic only)   Comprehensive metabolic panel   Healthcare maintenance     Discussed Covid vaccine and will hold other vaccines today.  Discussed importance of safe sexual practice to reduce risk of STI.  Condoms provided.       Other Visit Diagnoses    Screening for STDs (sexually transmitted diseases)       Relevant Orders   RPR       I have discontinued Richard Cox's sulfamethoxazole-trimethoprim. I am also having him maintain his acetaminophen, carvedilol, furosemide, ferrous sulfate, and Biktarvy.   Meds ordered this encounter  Medications  . bictegravir-emtricitabine-tenofovir AF (BIKTARVY) 50-200-25 MG TABS tablet    Sig: Take 1 tablet by mouth daily.    Dispense:  30 tablet    Refill:  5    Order Specific Question:   Supervising Provider    Answer:   Carlyle Basques [[id number]]     Follow-up: Return in about 3 months (around 07/05/2020).   Terri Piedra, MSN, FNP-C Nurse Practitioner [facility] for Infectious Disease [place] number:  [phone]

## 2020-04-04 NOTE — Patient Instructions (Addendum)
Nice to see you.  Continue to take your Biktarvy daily as prescribed  Stop taking Bactrim.  Refills of been sent to the pharmacy.  Recommend Covid vaccination  Plan for follow-up in 3 months or sooner if needed with lab work 1 to 2 weeks prior to your appointment.  Have a great day and stay safe!

## 2020-04-15 NOTE — Telephone Encounter (Signed)
[facility] Patient Advocate Encounter  Received fax notification on 04/15/2020 from [employer] program that despite being approved he is adap eligible and there will need to be an adap denial letter for him to remain covered by the program. [phone] for any questions. Will provide this fax notification to the financial counselor to get the denial letter.

## 2020-04-30 MED FILL — BIKTARVY 50-200-25 MG TABS: 50-200-25 | 30 days supply | Qty: 30 | Fill #1

## 2020-06-06 MED FILL — BIKTARVY 50-200-25 MG TABS: 50-200-25 | 30 days supply | Qty: 30 | Fill #2

## 2020-07-03 ENCOUNTER — Other Ambulatory Visit: Payer: Self-pay

## 2020-07-03 MED FILL — BIKTARVY 50-200-25 MG TABS: 50-200-25 | 30 days supply | Qty: 30 | Fill #0

## 2020-07-04 ENCOUNTER — Other Ambulatory Visit: Payer: Self-pay

## 2020-07-11 ENCOUNTER — Other Ambulatory Visit: Payer: Self-pay

## 2020-07-11 DIAGNOSIS — B20 Human immunodeficiency virus [HIV] disease: Secondary | ICD-10-CM

## 2020-07-11 DIAGNOSIS — Z113 Encounter for screening for infections with a predominantly sexual mode of transmission: Secondary | ICD-10-CM

## 2020-07-11 LAB — T-HELPER CELL (CD4) - (RCID CLINIC ONLY)
CD4 % Helper T Cell: 18 % — ABNORMAL LOW (ref 33–65)
CD4 T Cell Abs: 178 /uL — ABNORMAL LOW (ref 400–1790)

## 2020-07-13 LAB — COMPREHENSIVE METABOLIC PANEL
AG Ratio: 1.4 (calc) (ref 1.0–2.5)
ALT: 7 U/L — ABNORMAL LOW (ref 9–46)
AST: 17 U/L (ref 10–40)
Albumin: 4.5 g/dL (ref 3.6–5.1)
Alkaline phosphatase (APISO): 75 U/L (ref 36–130)
BUN: 16 mg/dL (ref 7–25)
CO2: 26 mmol/L (ref 20–32)
Calcium: 9.6 mg/dL (ref 8.6–10.3)
Chloride: 104 mmol/L (ref 98–110)
Creat: 1.25 mg/dL (ref 0.60–1.35)
Globulin: 3.3 g/dL (calc) (ref 1.9–3.7)
Glucose, Bld: 82 mg/dL (ref 65–99)
Potassium: 4.5 mmol/L (ref 3.5–5.3)
Sodium: 139 mmol/L (ref 135–146)
Total Bilirubin: 0.5 mg/dL (ref 0.2–1.2)
Total Protein: 7.8 g/dL (ref 6.1–8.1)

## 2020-07-13 LAB — RPR: RPR Ser Ql: NONREACTIVE

## 2020-07-13 LAB — HIV-1 RNA QUANT-NO REFLEX-BLD
HIV 1 RNA Quant: <20 Copies/mL
HIV-1 RNA Quant, Log: <1.3 Log cps/mL

## 2020-07-18 ENCOUNTER — Encounter: Payer: Self-pay | Admitting: Family

## 2020-07-22 ENCOUNTER — Emergency Department (HOSPITAL_COMMUNITY)
Admission: EM | Admit: 2020-07-22 | Discharge: 2020-07-22 | Disposition: A | Discharge status: Home / Self Care | Payer: 59 | Attending: Emergency Medicine | Admitting: Emergency Medicine

## 2020-07-22 ENCOUNTER — Other Ambulatory Visit: Payer: Self-pay

## 2020-07-22 ENCOUNTER — Encounter (HOSPITAL_COMMUNITY): Payer: Self-pay | Admitting: Emergency Medicine

## 2020-07-22 ENCOUNTER — Encounter: Payer: Self-pay | Admitting: Family

## 2020-07-22 ENCOUNTER — Telehealth: Payer: Self-pay | Admitting: *Deleted

## 2020-07-22 DIAGNOSIS — R1012 Left upper quadrant pain: Secondary | ICD-10-CM | POA: Insufficient documentation

## 2020-07-22 DIAGNOSIS — Z5321 Procedure and treatment not carried out due to patient leaving prior to being seen by health care provider: Secondary | ICD-10-CM | POA: Diagnosis not present

## 2020-07-22 DIAGNOSIS — R1010 Upper abdominal pain, unspecified: Secondary | ICD-10-CM

## 2020-07-22 LAB — COMPREHENSIVE METABOLIC PANEL
ALT: 8 U/L (ref 0–44)
AST: 16 U/L (ref 15–41)
Albumin: 3.7 g/dL (ref 3.5–5.0)
Alkaline Phosphatase: 64 U/L (ref 38–126)
Anion gap: 12 (ref 5–15)
BUN: 17 mg/dL (ref 6–20)
CO2: 25 mmol/L (ref 22–32)
Calcium: 9.3 mg/dL (ref 8.9–10.3)
Chloride: 103 mmol/L (ref 98–111)
Creatinine, Ser: 0.97 mg/dL (ref 0.61–1.24)
GFR calc Af Amer: >60 mL/min (ref >60)
GFR calc non Af Amer: >60 mL/min (ref >60)
Glucose, Bld: 97 mg/dL (ref 70–99)
Potassium: 3.9 mmol/L (ref 3.5–5.1)
Sodium: 140 mmol/L (ref 135–145)
Total Bilirubin: 0.5 mg/dL (ref 0.3–1.2)
Total Protein: 7.7 g/dL (ref 6.5–8.1)

## 2020-07-22 LAB — CBC
HCT: 37.3 % — ABNORMAL LOW (ref 39.0–52.0)
Hemoglobin: 12.2 g/dL — ABNORMAL LOW (ref 13.0–17.0)
MCH: 28.8 pg (ref 26.0–34.0)
MCHC: 32.7 g/dL (ref 30.0–36.0)
MCV: 88 fL (ref 80.0–100.0)
Platelets: 184 10*3/uL (ref 150–400)
RBC: 4.24 MIL/uL (ref 4.22–5.81)
RDW: 14.5 % (ref 11.5–15.5)
WBC: 4.5 10*3/uL (ref 4.0–10.5)
nRBC: 0 % (ref 0.0–0.2)

## 2020-07-22 LAB — URINALYSIS, ROUTINE W REFLEX MICROSCOPIC
Bacteria, UA: NONE SEEN
Bilirubin Urine: NEGATIVE
Glucose, UA: NEGATIVE mg/dL
Ketones, ur: NEGATIVE mg/dL
Leukocytes,Ua: NEGATIVE
Nitrite: NEGATIVE
Protein, ur: 100 mg/dL — AB
Specific Gravity, Urine: 1.024 (ref 1.005–1.030)
pH: 6 (ref 5.0–8.0)

## 2020-07-22 LAB — LIPASE, BLOOD: Lipase: 205 U/L — ABNORMAL HIGH (ref 11–51)

## 2020-07-22 NOTE — ED Triage Notes (Signed)
Pt c/o left sided abdominal pain x 2 weeks. States that he feels better after he passes gas or has a BM.

## 2020-07-22 NOTE — Telephone Encounter (Signed)
Received FMLA paperwork for patient. Placed in [place].  Patient has missed multiple scheduled visits for follow up. Landis Gandy, RN

## 2020-07-24 ENCOUNTER — Emergency Department (HOSPITAL_COMMUNITY): Payer: 59

## 2020-07-24 ENCOUNTER — Encounter (HOSPITAL_COMMUNITY): Payer: Self-pay

## 2020-07-24 ENCOUNTER — Other Ambulatory Visit: Payer: Self-pay

## 2020-07-24 ENCOUNTER — Emergency Department (HOSPITAL_COMMUNITY)
Admission: EM | Admit: 2020-07-24 | Discharge: 2020-07-24 | Disposition: A | Discharge status: Home / Self Care | Payer: 59 | Attending: Emergency Medicine | Admitting: Emergency Medicine

## 2020-07-24 DIAGNOSIS — I110 Hypertensive heart disease with heart failure: Secondary | ICD-10-CM | POA: Diagnosis not present

## 2020-07-24 DIAGNOSIS — I509 Heart failure, unspecified: Secondary | ICD-10-CM | POA: Insufficient documentation

## 2020-07-24 DIAGNOSIS — R0789 Other chest pain: Secondary | ICD-10-CM | POA: Insufficient documentation

## 2020-07-24 DIAGNOSIS — Z79899 Other long term (current) drug therapy: Secondary | ICD-10-CM | POA: Insufficient documentation

## 2020-07-24 DIAGNOSIS — R1900 Intra-abdominal and pelvic swelling, mass and lump, unspecified site: Secondary | ICD-10-CM

## 2020-07-24 DIAGNOSIS — R1084 Generalized abdominal pain: Secondary | ICD-10-CM | POA: Insufficient documentation

## 2020-07-24 DIAGNOSIS — Z87891 Personal history of nicotine dependence: Secondary | ICD-10-CM | POA: Insufficient documentation

## 2020-07-24 LAB — CBC
HCT: 40.6 % (ref 39.0–52.0)
Hemoglobin: 13 g/dL (ref 13.0–17.0)
MCH: 28.4 pg (ref 26.0–34.0)
MCHC: 32 g/dL (ref 30.0–36.0)
MCV: 88.6 fL (ref 80.0–100.0)
Platelets: 239 10*3/uL (ref 150–400)
RBC: 4.58 MIL/uL (ref 4.22–5.81)
RDW: 14.6 % (ref 11.5–15.5)
WBC: 5.5 10*3/uL (ref 4.0–10.5)
nRBC: 0 % (ref 0.0–0.2)

## 2020-07-24 LAB — COMPREHENSIVE METABOLIC PANEL
ALT: 16 U/L (ref 0–44)
AST: 23 U/L (ref 15–41)
Albumin: 3.7 g/dL (ref 3.5–5.0)
Alkaline Phosphatase: 72 U/L (ref 38–126)
Anion gap: 10 (ref 5–15)
BUN: 21 mg/dL — ABNORMAL HIGH (ref 6–20)
CO2: 26 mmol/L (ref 22–32)
Calcium: 9.3 mg/dL (ref 8.9–10.3)
Chloride: 101 mmol/L (ref 98–111)
Creatinine, Ser: 1.36 mg/dL — ABNORMAL HIGH (ref 0.61–1.24)
GFR calc Af Amer: >60 mL/min (ref >60)
GFR calc non Af Amer: >60 mL/min (ref >60)
Glucose, Bld: 97 mg/dL (ref 70–99)
Potassium: 3.8 mmol/L (ref 3.5–5.1)
Sodium: 137 mmol/L (ref 135–145)
Total Bilirubin: 0.5 mg/dL (ref 0.3–1.2)
Total Protein: 8 g/dL (ref 6.5–8.1)

## 2020-07-24 LAB — LACTATE DEHYDROGENASE: LDH: 309 U/L — ABNORMAL HIGH (ref 98–192)

## 2020-07-24 LAB — LIPASE, BLOOD: Lipase: 116 U/L — ABNORMAL HIGH (ref 11–51)

## 2020-07-24 LAB — URIC ACID: Uric Acid, Serum: 10.2 mg/dL — ABNORMAL HIGH (ref 3.7–8.6)

## 2020-07-24 LAB — TROPONIN I (HIGH SENSITIVITY)
Troponin I (High Sensitivity): 69 ng/L — ABNORMAL HIGH (ref <18)
Troponin I (High Sensitivity): 76 ng/L — ABNORMAL HIGH (ref <18)

## 2020-07-24 MED ORDER — ONDANSETRON 4 MG PO TBDP: 4.0000 mg | ORAL_TABLET | Freq: Three times a day (TID) | ORAL | 0 refills | Status: DC | PRN

## 2020-07-24 MED ORDER — IOHEXOL 300 MG/ML  SOLN
100.0000 mL | Freq: Once | INTRAMUSCULAR | Status: AC | PRN
  Administered 2020-07-24: 100 mL via INTRAVENOUS

## 2020-07-24 MED ORDER — ALLOPURINOL 300 MG PO TABS: 300.0000 mg | ORAL_TABLET | Freq: Every day | ORAL | 0 refills | Status: DC

## 2020-07-24 MED ORDER — ALLOPURINOL 300 MG PO TABS
300.0000 mg | ORAL_TABLET | Freq: Once | ORAL | Status: AC
  Administered 2020-07-24: 300 mg via ORAL
  Filled 2020-07-24: qty 1

## 2020-07-24 MED ORDER — PANTOPRAZOLE SODIUM 40 MG IV SOLR
40.0000 mg | Freq: Once | INTRAVENOUS | Status: AC
  Administered 2020-07-24: 40 mg via INTRAVENOUS
  Filled 2020-07-24: qty 40

## 2020-07-24 MED ORDER — HYDROMORPHONE HCL 1 MG/ML IJ SOLN
0.5000 mg | Freq: Once | INTRAMUSCULAR | Status: AC
  Administered 2020-07-24: 0.5 mg via INTRAVENOUS
  Filled 2020-07-24: qty 1

## 2020-07-24 MED ORDER — SENNOSIDES-DOCUSATE SODIUM 8.6-50 MG PO TABS: 1.0000 | ORAL_TABLET | Freq: Every evening | ORAL | 0 refills | Status: DC | PRN

## 2020-07-24 MED ORDER — ONDANSETRON HCL 4 MG/2ML IJ SOLN
4.0000 mg | Freq: Once | INTRAMUSCULAR | Status: AC
  Administered 2020-07-24: 4 mg via INTRAVENOUS
  Filled 2020-07-24: qty 2

## 2020-07-24 MED ORDER — SODIUM CHLORIDE 0.9 % IV BOLUS
1000.0000 mL | Freq: Once | INTRAVENOUS | Status: AC
  Administered 2020-07-24: 1000 mL via INTRAVENOUS

## 2020-07-24 MED ORDER — OXYCODONE-ACETAMINOPHEN 5-325 MG PO TABS: 1.0000 | ORAL_TABLET | Freq: Four times a day (QID) | ORAL | 0 refills | Status: DC | PRN

## 2020-07-24 NOTE — Discharge Instructions (Signed)
You were seen in the emergency room today with abdominal pain.  I am sending you home with several medications including pain medicines to take only as needed.  Please take the allopurinol daily.  The CT scan of your abdomen showed several masses in her abdomen which need urgent follow-up with the oncologist.  We are unsure what these masses are but we have scheduled an oncology appointment for you tomorrow at 4:30 PM.  Please arrive to the above address and go to the fourth floor to be seen at the cancer center.  They would like for you to arrive by 4 PM.  Please do not miss this appointment as it is crucial to diagnosis and treating these issues.   The above pain medicines can make you drowsy and you cannot take them with alcohol or other pain medicines.  He cannot drive a car while taking his medicines.  They may cause constipation so also called in constipation medication.   Return to the emergency department any new or suddenly worsening symptoms.

## 2020-07-24 NOTE — ED Provider Notes (Signed)
Blood pressure (!) 157/115, pulse 76, temperature 98.1 F (36.7 C), temperature source Oral, resp. rate (!) 21, height 6\' 1"  (1.854 m), weight 74.8 kg, SpO2 98 %.  Assuming care from Dr. Roderic Palau.  In short, Richard Cox is a 39 y.o. male with a chief complaint of Chest Pain .  Refer to the original H&P for additional details.  The current plan of care is to f/u on LDH and Uric acid and discuss with Oncology to decide on inpatient vs outpatient mgmt.  04:00 PM  Spoke with Dr. Delton Coombes regarding the LDH and uric acid levels.  We discussed disposition for the patient.  He is feeling improved here after pain medication and when I reassessed him he is eating [employer] in the room without significant pain.  We discussed his CT scan results and any barriers he might have to outpatient follow-up.  The patient has transportation and does not think you will have any problem making the appointment tomorrow.   04:15 PM  Spoke with Dr. Delton Coombes again.  They have arranged for a 4:30 PM appointment tomorrow at the cancer center.  I have called in pain, nausea, constipation medication to the patient's pharmacy after review of the [facility] drug database.  Have also called in a prescription for allopurinol.  Discussed the plan with the patient who is comfortable at discharge and tolerating PO without difficulty.     Margette Fast, MD 07/24/20 1620

## 2020-07-24 NOTE — ED Provider Notes (Signed)
[facility]Y DE[facility]MENT Provider Note   CSN: [id number] Arrival date & time: 07/24/20  1003     History Chief Complaint  Patient presents with  . Chest Pain    Richard Cox is a 39 y.o. male.  Patient complains of chest discomfort especially at night.  But when you ask the patient about his pain he points to his umbilicus where the pain is.  Patient has a history of HIV and congestive heart failure  The history is provided by the patient. No language interpreter was used.  Chest Pain Pain location:  Epigastric Pain quality: aching   Pain radiates to:  Does not radiate Pain severity:  Moderate Onset quality:  Sudden Timing:  Constant Progression:  Waxing and waning Chronicity:  New Context: not breathing   Relieved by:  Nothing Worsened by:  Nothing Associated symptoms: abdominal pain   Associated symptoms: no back pain, no cough, no fatigue and no headache        Past Medical History:  Diagnosis Date  . CHF (congestive heart failure) ([place])   . Essential hypertension   . HIV infection ([place])   . Noncompliance with medication regimen   . 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 Varicella zoster 01/04/2020  . AIDS (acquired immune deficiency syndrome) ([place]) 10/10/2019  . Healthcare maintenance 10/10/2019  . Acute on chronic HFrEF (heart failure with reduced ejection fraction) -combined diastolic and systolic dysfunction CHF 68/09/5725  . HIV (human immunodeficiency virus infection) ([place]) 09/18/2019  . Polysubstance abuse -cocaine use in remission 09/18/2019  . Alcohol abuse 09/18/2019  . Tobacco abuse 09/18/2019  . Nonspecific serologic evidence of human immunodeficiency virus (HIV) 03/13/2019  . Acute exacerbation of CHF (congestive heart failure) ([place]) 03/10/2019  . Hypertensive urgency 03/10/2019  . Congestive heart failure ([place]) 03/10/2019  . SOB (shortness of breath) 03/10/2019  . Noncompliance with  medication regimen 03/10/2019    Past Surgical History:  Procedure Laterality Date  . NO PAST SURGERIES         Family History  Problem Relation Age of Onset  . Cancer Mother   . Diabetes Mellitus II Mother   . Cancer Father     Social History   Tobacco Use  . Smoking status: Former Smoker    Packs/day: 0.25    Types: Cigarettes    Quit date: 11/23/2018    Years since quitting: 1.6  . Smokeless tobacco: Never Used  Vaping Use  . Vaping Use: Never used  Substance Use Topics  . Alcohol use: Not Currently  . Drug use: Yes    Types: Marijuana    Comment: x 1 week    Home Medications Prior to Admission medications   Medication Sig Start Date End Date Taking? Authorizing Provider  acetaminophen (TYLENOL) 325 MG tablet Take 2 tablets (650 mg total) by mouth every 6 (six) hours as needed for mild pain, fever or headache (or Fever >/= 101). 09/19/19  Yes Emokpae, Courage, MD  bictegravir-emtricitabine-tenofovir AF (BIKTARVY) 50-200-25 MG TABS tablet Take 1 tablet by mouth daily. 04/04/20  Yes Golden Circle, FNP  carvedilol (COREG) 6.25 MG tablet Take 1 tablet (6.25 mg total) by mouth 2 (two) times daily with a meal. For BP and Heart 09/19/19  Yes Emokpae, Courage, MD  ferrous sulfate 325 (65 FE) MG tablet Take 1 tablet (325 mg total) by mouth daily with breakfast. 09/19/19 07/24/20 Yes Emokpae, Courage, MD  furosemide (LASIX) 40  MG tablet Take 1 tablet (40 mg total) by mouth daily. 09/19/19 07/24/20 Yes Emokpae, Courage, MD  hydrALAZINE (APRESOLINE) 50 MG tablet Take 50 mg by mouth 3 (three) times daily. 07/22/20  Yes [provider]  isosorbide mononitrate (IMDUR) 30 MG 24 hr tablet Take 30 mg by mouth daily. 04/30/20  Yes [provider]  potassium chloride (KLOR-CON) 10 MEQ tablet Take 10 mEq by mouth daily. 04/30/20  Yes [provider]    Allergies    Neosporin [neomycin-bacitracin zn-polymyx]  Review of Systems   Review of Systems   Constitutional: Negative for appetite change and fatigue.  HENT: Negative for congestion, ear discharge and sinus pressure.   Eyes: Negative for discharge.  Respiratory: Negative for cough.   Gastrointestinal: Positive for abdominal pain. Negative for diarrhea.  Genitourinary: Negative for frequency and hematuria.  Musculoskeletal: Negative for back pain.  Skin: Negative for rash.  Neurological: Negative for seizures and headaches.  Psychiatric/Behavioral: Negative for hallucinations.    Physical Exam Updated Vital Signs BP (!) 157/115   Pulse 76   Temp 98.1 F (36.7 C) (Oral)   Resp (!) 21   Ht 6\' 1"  (1.854 m)   Wt 74.8 kg   SpO2 98%   BMI 21.77 kg/m   Physical Exam Vitals and nursing note reviewed.  Constitutional:      Appearance: He is well-developed.  HENT:     Head: Normocephalic.     Nose: Nose normal.  Eyes:     General: No scleral icterus.    Conjunctiva/sclera: Conjunctivae normal.  Neck:     Thyroid: No thyromegaly.  Cardiovascular:     Rate and Rhythm: Normal rate and regular rhythm.     Heart sounds: No murmur heard.  No friction rub. No gallop.   Pulmonary:     Breath sounds: No stridor. No wheezing or rales.  Chest:     Chest wall: No tenderness.  Abdominal:     General: There is no distension.     Tenderness: There is abdominal tenderness. There is no rebound.     Comments: Mild periumbilical tenderness  Musculoskeletal:        General: Normal range of motion.     Cervical back: Neck supple.  Lymphadenopathy:     Cervical: No cervical adenopathy.  Skin:    Findings: No erythema or rash.  Neurological:     Mental Status: He is alert and oriented to person, place, and time.     Motor: No abnormal muscle tone.     Coordination: Coordination normal.  Psychiatric:        Behavior: Behavior normal.     ED Results / Procedures / Treatments   Labs (all labs ordered are listed, but only abnormal results are displayed) Labs Reviewed   LIPASE, BLOOD - Abnormal; Notable for the following components:      Result Value   Lipase 116 (*)    All other components within normal limits  COMPREHENSIVE METABOLIC PANEL - Abnormal; Notable for the following components:   BUN 21 (*)    Creatinine, Ser 1.36 (*)    All other components within normal limits  TROPONIN I (HIGH SENSITIVITY) - Abnormal; Notable for the following components:   Troponin I (High Sensitivity) 69 (*)    All other components within normal limits  TROPONIN I (HIGH SENSITIVITY) - Abnormal; Notable for the following components:   Troponin I (High Sensitivity) 76 (*)    All other components within normal limits  CBC  LACTATE DEHYDROGENASE  URIC ACID    EKG EKG Interpretation  Date/Time:  Wednesday July 24 2020 10:09:56 EDT Ventricular Rate:  79 PR Interval:  166 QRS Duration: 96 QT Interval:  436 QTC Calculation: 499 R Axis:   32 Text Interpretation: Normal sinus rhythm Possible Left atrial enlargement Left ventricular hypertrophy ( Cornell product , Romhilt-Estes ) ST & Marked T wave abnormality, consider inferior ischemia ST & Marked T wave abnormality, consider anterolateral ischemia Prolonged QT Abnormal ECG Confirmed by Milton Ferguson [phone]) on 07/24/2020 10:36:00 AM   Radiology DG Chest 2 View  Result Date: 07/24/2020 CLINICAL DATA:  Chest pain, shortness of breath EXAM: CHEST - 2 VIEW COMPARISON:  09/17/2019 FINDINGS: Cardiomegaly. No confluent airspace opacities, effusions or edema. No acute bony abnormality. IMPRESSION: Cardiomegaly.  No active disease. Electronically Signed   By: Rolm Baptise M.D.   On: 07/24/2020 10:34   CT ABDOMEN PELVIS W CONTRAST  Result Date: 07/24/2020 CLINICAL DATA:  Acute generalized abdominal pain and distention. EXAM: CT ABDOMEN AND PELVIS WITH CONTRAST TECHNIQUE: Multidetector CT imaging of the abdomen and pelvis was performed using the standard protocol following bolus administration of intravenous contrast.  CONTRAST:  137mL OMNIPAQUE IOHEXOL 300 MG/ML  SOLN COMPARISON:  None. FINDINGS: Lower chest: No acute abnormality. Hepatobiliary: No focal liver abnormality is seen. No gallstones, gallbladder wall thickening, or biliary dilatation. Pancreas: There is a large lobulated and heterogeneous mass in the epigastric region which appears to be arising from or involving the pancreatic body and tail, measuring 13.8 by 11.2 cm. This is highly concerning for malignancy. Spleen: The mass described above appears to have medially abut the posterior spleen, and involvement of the spleen cannot be excluded. Adrenals/Urinary Tract: Adrenal glands are unremarkable. Kidneys are normal, without renal calculi, focal lesion, or hydronephrosis. Bladder is unremarkable. Stomach/Bowel: The stomach appears to be significantly displaced anteriorly by the previously described mass. No definite evidence of large or small bowel dilatation is noted. 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 Multiple other lobulated large masses are seen in the mesenteric region concerning for adenopathy, and potentially the previously described mass in left upper quadrant may represent adenopathy. 9.3 x 5.1 cm mass is noted in the inferior pelvis. 9.7 x 4.1 cm mass is seen more superiorly in the pelvis. Probable 7.3 x 5.8 cm mass is noted in the right upper quadrant. Reproductive: Prostate is unremarkable. Other: Mild amount of free fluid is noted in the pelvis. Musculoskeletal: No acute or significant osseous findings. IMPRESSION: Large lobulated and heterogeneous mass is noted in the epigastric region which measures 13.8 x 11.2 cm and is concerning for adenopathy or other malignancy; it appears to be involving the body and tail of the  pancreas, and potentially may be arising from the pancreas. This mass does appear to surround branches of the celiac artery as well as the splenic vein. 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 Multiple other lobulated masses are seen in the mesenteric region concerning for adenopathy or malignancy or possibly lymphoma. Mild amount of free fluid is noted in the pelvis. Electronically Signed  By: Marijo Conception M.D.   On: 07/24/2020 13:16    Procedures Procedures (including critical care time)  Medications Ordered in ED Medications  pantoprazole (PROTONIX) injection 40 mg (has no administration in time range)  sodium chloride 0.9 % bolus 1,000 mL (1,000 mLs Intravenous New Bag/Given 07/24/20 1237)  HYDROmorphone (DILAUDID) injection 0.5 mg (0.5 mg Intravenous Given 07/24/20 1230)  ondansetron (ZOFRAN) injection 4 mg (4 mg Intravenous Given 07/24/20 1223)  iohexol (OMNIPAQUE) 300 MG/ML solution 100 mL (100 mLs Intravenous Contrast Given 07/24/20 1245)    ED Course  I have reviewed the triage vital signs and the nursing notes.  Pertinent labs & imaging results that were available during my care of the patient were reviewed by me and considered in my medical decision making (see chart for details). CRITICAL CARE Performed by: Milton Ferguson Total critical care time: 45 minutes Critical care time was exclusive of separately billable procedures and treating other patients. Critical care was necessary to treat or prevent imminent or life-threatening deterioration. Critical care was time spent personally by me on the following activities: development of treatment plan with patient and/or surrogate as well as nursing, discussions with consultants, evaluation of patient's response to treatment,  examination of patient, obtaining history from patient or surrogate, ordering and performing treatments and interventions, ordering and review of laboratory studies, ordering and review of radiographic studies, pulse oximetry and re-evaluation of patient's condition.    Patient has inverted T waves that appear chronic along with mild elevated troponins.  I spoke with cardiology Dr. Domenic Polite and he reviewed the EKG and troponins.  Has been followed up with cardiology and has had an echo and has a history of heart failure.  Dr. Domenic Polite felt like the patient could follow-up as an outpatient he did not feel like this is ACS and neither did I.     CT of the abdomen showed multiple masses in his abdomen possibly arising from the pancreas.  I spoke with oncology Dr. Delton Coombes and he wanted [place] to get an LDH and a uric acid and then call them back.  The patient clinically is no longer having any discomfort and nausea.  If oncology decides the patient can go home he should be sent home with pain and nausea medicine   MDM Rules/Calculators/A&P                         Patient with new mass in the abdomen.  Arrangements have been made with Dr. Delton Coombes for outpatient follow-up tomorrow       This patient presents to the ED for concern of abdominal pain, this involves an extensive number of treatment options, and is a complaint that carries with it a high risk of complications and morbidity.  The differential diagnosis includes pancreatitis gastritis   Lab Tests:   I Ordered, reviewed, and interpreted labs, which included CBC chemistries troponin.  Patient has elevated lipase and elevated troponin CBC unremarkable  Medicines ordered:   I ordered medication Zofran for nausea and Dilaudid for pain  Imaging Studies ordered:   I ordered imaging studies which included CT abdomen  I independently visualized and interpreted imaging which showed mass from the pancreas  Additional history  obtained:   Additional history obtained from records  Previous records obtained and reviewed.  Consultations Obtained:   I consulted cardiology and oncology and discussed lab and imaging findings  Reevaluation:  After the interventions stated above, I reevaluated the patient  and found improved  Critical Interventions:  .   Final Clinical Impression(s) / ED Diagnoses Final diagnoses:  None    Rx / DC Orders ED Discharge Orders    None       Milton Ferguson, MD 07/24/20 1810

## 2020-07-24 NOTE — ED Triage Notes (Signed)
Pt reports history of chf and c/o chest pain and sob approx 1 hour ago.  Reports felt like was going to pass out.  Pt also c/o intermittent abd cramping.

## 2020-07-24 NOTE — ED Notes (Signed)
Amy(Cancer Center) called with appt with Dr. Delton Coombes tomorrow 9/16.  Arrive at 4pm.  One person may accompany PT.  Dr. Laverta [place] informed.

## 2020-07-25 ENCOUNTER — Inpatient Hospital Stay (HOSPITAL_COMMUNITY): Payer: 59 | Attending: Hematology | Admitting: Hematology

## 2020-07-25 ENCOUNTER — Encounter (HOSPITAL_COMMUNITY): Payer: Self-pay | Admitting: Hematology

## 2020-07-25 DIAGNOSIS — I509 Heart failure, unspecified: Secondary | ICD-10-CM | POA: Diagnosis not present

## 2020-07-25 DIAGNOSIS — Z87891 Personal history of nicotine dependence: Secondary | ICD-10-CM | POA: Diagnosis not present

## 2020-07-25 DIAGNOSIS — Z8249 Family history of ischemic heart disease and other diseases of the circulatory system: Secondary | ICD-10-CM | POA: Insufficient documentation

## 2020-07-25 DIAGNOSIS — E790 Hyperuricemia without signs of inflammatory arthritis and tophaceous disease: Secondary | ICD-10-CM | POA: Diagnosis not present

## 2020-07-25 DIAGNOSIS — R1906 Epigastric swelling, mass or lump: Secondary | ICD-10-CM | POA: Insufficient documentation

## 2020-07-25 DIAGNOSIS — R591 Generalized enlarged lymph nodes: Secondary | ICD-10-CM

## 2020-07-25 DIAGNOSIS — R110 Nausea: Secondary | ICD-10-CM | POA: Insufficient documentation

## 2020-07-25 DIAGNOSIS — B20 Human immunodeficiency virus [HIV] disease: Secondary | ICD-10-CM | POA: Diagnosis not present

## 2020-07-25 DIAGNOSIS — R590 Localized enlarged lymph nodes: Secondary | ICD-10-CM | POA: Insufficient documentation

## 2020-07-25 DIAGNOSIS — Z800 Family history of malignant neoplasm of digestive organs: Secondary | ICD-10-CM | POA: Insufficient documentation

## 2020-07-25 DIAGNOSIS — Z833 Family history of diabetes mellitus: Secondary | ICD-10-CM | POA: Diagnosis not present

## 2020-07-25 DIAGNOSIS — Z79899 Other long term (current) drug therapy: Secondary | ICD-10-CM | POA: Insufficient documentation

## 2020-07-25 DIAGNOSIS — I110 Hypertensive heart disease with heart failure: Secondary | ICD-10-CM | POA: Diagnosis not present

## 2020-07-25 DIAGNOSIS — Z807 Family history of other malignant neoplasms of lymphoid, hematopoietic and related tissues: Secondary | ICD-10-CM | POA: Diagnosis not present

## 2020-07-25 NOTE — Progress Notes (Signed)
[facility] [address], [place] [address]   CLINIC:  Medical Oncology/Hematology  CONSULT NOTE  Patient Care Team: Patient, No Pcp Per as PCP - General (General Practice) Satira Sark, MD as PCP - Cardiology (Cardiology)  CHIEF COMPLAINTS/PURPOSE OF CONSULTATION:  Evaluation of multiple abdominal masses  HISTORY OF PRESENTING ILLNESS:  [name] 39 y.o. male is here because of evaluation of multiple abdominal masses, at the request of Dr. Nanda Quinton from [facility]. He went to [facility] on 9/15 after having abdominal pain especially at night.  Today he is accompanied by his mother. He reports that his abdominal pain felt like his stomach was becoming twisted. He has lost about 10 lbs in 2 weeks due to pain in his abdomen and his appetite dropping significantly. His bowels have hardened since he became sick and he sometimes gets melena when he takes Pepto Bismol, but denies hematochezia. He started having nausea when he went to the ED, but denies vomiting. He is supposed to take iron tablets, but he stopped when his prescription ran out. He denies having any skin rashes, headaches or vision changes.  He is vaccinated for COVID. 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 He quit smoking in 2020. His mother has multiple myeloma; his father has multiple myeloma; maternal GF had colon cancer.   MEDICAL HISTORY:  Past Medical History:  Diagnosis Date  . CHF (congestive heart failure) ([place])   . Essential hypertension   . HIV infection ([place])   . Noncompliance with medication regimen   . Secondary cardiomyopathy ([place])     SURGICAL HISTORY: Past Surgical History:  Procedure Laterality Date  . NO PAST SURGERIES      SOCIAL HISTORY: Social History   Socioeconomic History  . Marital status: Single    Spouse name: Not on file  . Number of children: 0  . Years of education: 75  . Highest education level: Not on file  Occupational History  . Not on  file  Tobacco Use  . Smoking status: Former Smoker    Packs/day: 0.25    Types: Cigarettes    Quit date: 11/23/2018    Years since quitting: 1.6  . Smokeless tobacco: Never Used  Vaping Use  . Vaping Use: Never used  Substance and Sexual Activity  . Alcohol use: Not Currently  . Drug use: Yes    Types: Marijuana    Comment: x 1 week  . Sexual activity: Not Currently  Other Topics Concern  . Not on file  Social History Narrative  . Not on file   Social Determinants of Health   Financial Resource Strain:   . Difficulty of Paying Living Expenses: Not on file  Food Insecurity:   . Worried About Charity fundraiser in the Last Year: Not on file  . Ran Out of Food in the Last Year: Not on file  Transportation Needs:   . Lack of Transportation (Medical): Not on file  . Lack of Transportation (Non-Medical): Not on file  Physical Activity:   . Days of Exercise per Week: Not on file  . Minutes of Exercise per Session: Not on file  Stress:   . Feeling of Stress : Not on file  Social Connections:   . Frequency of Communication with Friends and Family: Not on file  . Frequency of Social Gatherings with Friends and Family: Not on file  . Attends Religious Services: Not on file  . Active  Member of Clubs or Organizations: Not on file  . Attends Archivist Meetings: Not on file  . Marital Status: Not on file  Intimate Partner Violence:   . Fear of Current or Ex-Partner: Not on file  . Emotionally Abused: Not on file  . Physically Abused: Not on file  . Sexually Abused: Not on file    FAMILY HISTORY: Family History  Problem Relation Age of Onset  . Cancer Mother   . Diabetes Mellitus II Mother   . Cancer Father   . Congestive Heart Failure Brother   . Heart Problems Brother   . Lupus Brother     ALLERGIES:  is allergic to neosporin [neomycin-bacitracin zn-polymyx].  MEDICATIONS:  Current Outpatient Medications  Medication Sig Dispense Refill  . acetaminophen  (TYLENOL) 325 MG tablet Take 2 tablets (650 mg total) by mouth every 6 (six) hours as needed for mild pain, fever or headache (or Fever >/= 101). 12 tablet 0  . allopurinol (ZYLOPRIM) 300 MG tablet Take 1 tablet (300 mg total) by mouth daily for 20 days. 20 tablet 0  . bictegravir-emtricitabine-tenofovir AF (BIKTARVY) 50-200-25 MG TABS tablet Take 1 tablet by mouth daily. 30 tablet 5  . carvedilol (COREG) 6.25 MG tablet Take 1 tablet (6.25 mg total) by mouth 2 (two) times daily with a meal. For BP and Heart 60 tablet 11  . hydrALAZINE (APRESOLINE) 50 MG tablet Take 50 mg by mouth 3 (three) times daily.    . isosorbide mononitrate (IMDUR) 30 MG 24 hr tablet Take 30 mg by mouth daily.    . ondansetron (ZOFRAN ODT) 4 MG disintegrating tablet Take 1 tablet (4 mg total) by mouth every 8 (eight) hours as needed. 20 tablet 0  . oxyCODONE-acetaminophen (PERCOCET/ROXICET) 5-325 MG tablet Take 1 tablet by mouth every 6 (six) hours as needed for severe pain. 12 tablet 0  . potassium chloride (KLOR-CON) 10 MEQ tablet Take 10 mEq by mouth daily.    Marland Kitchen senna-docusate (SENOKOT-S) 8.6-50 MG tablet Take 1 tablet by mouth at bedtime as needed for mild constipation or moderate constipation. 20 tablet 0  . ferrous sulfate 325 (65 FE) MG tablet Take 1 tablet (325 mg total) by mouth daily with breakfast. 30 tablet 3  . furosemide (LASIX) 40 MG tablet Take 1 tablet (40 mg total) by mouth daily. 30 tablet 11   No current facility-administered medications for this visit.    REVIEW OF SYSTEMS:   Review of Systems  Constitutional: Positive for appetite change (moderately decreased), fatigue (moderate) and unexpected weight change (10 lbs in 2 weeks).  Eyes: Negative for eye problems.  Respiratory: Positive for shortness of breath.   Cardiovascular: Positive for chest pain.  Gastrointestinal: Positive for abdominal pain (5/10 lower abdominal pain), constipation and nausea. Negative for blood in stool and vomiting.  Skin:  Negative for rash.  Neurological: Positive for dizziness. Negative for headaches.  Psychiatric/Behavioral: Positive for depression and sleep disturbance (d/t pain).  All other systems reviewed and are negative.    PHYSICAL EXAMINATION: ECOG PERFORMANCE STATUS: 0 - Asymptomatic  Vitals:   07/25/20 1615  BP: (!) 156/91  Pulse: 72  Resp: 18  Temp: (!) 97.5 F (36.4 C)  SpO2: 100%   Filed Weights   07/25/20 1615  Weight: 154 lb 11.2 oz (70.2 kg)   Physical Exam Vitals reviewed.  Constitutional:      Appearance: Normal appearance.  Cardiovascular:     Rate and Rhythm: Normal rate and regular rhythm.  Pulses: Normal pulses.     Heart sounds: Normal heart sounds.  Pulmonary:     Effort: Pulmonary effort is normal.     Breath sounds: Normal breath sounds.  Abdominal:     Palpations: Abdomen is soft. There is no hepatomegaly.     Tenderness: There is abdominal tenderness in the left lower quadrant.     Hernia: No hernia is present.  Lymphadenopathy:     Cervical: No cervical adenopathy.     Upper Body:     Right upper body: No supraclavicular, axillary or pectoral adenopathy.     Left upper body: No supraclavicular, axillary or pectoral adenopathy.     Lower Body: No right inguinal adenopathy. No left inguinal adenopathy.  Neurological:     General: No focal deficit present.     Mental Status: He is alert and oriented to person, place, and time.  Psychiatric:        Mood and Affect: Mood normal.        Behavior: Behavior normal.      LABORATORY DATA:  I have reviewed the data as listed CBC Latest Ref Rng & Units 07/24/2020 07/22/2020 03/06/2020  WBC 4.0 - 10.5 K/uL 5.5 4.5 3.7(L)  Hemoglobin 13.0 - 17.0 g/dL 13.0 12.2(L) 12.7(L)  Hematocrit 39 - 52 % 40.6 37.3(L) 37.5(L)  Platelets 150 - 400 K/uL 239 184 157   CMP Latest Ref Rng & Units 07/24/2020 07/22/2020 07/11/2020  Glucose 70 - 99 mg/dL 97 97 82  BUN 6 - 20 mg/dL 21(H) 17 16  Creatinine 0.61 - 1.24 mg/dL  1.36(H) 0.97 1.25  Sodium 135 - 145 mmol/L 137 140 139  Potassium 3.5 - 5.1 mmol/L 3.8 3.9 4.5  Chloride 98 - 111 mmol/L 101 103 104  CO2 22 - 32 mmol/L '26 25 26  ' Calcium 8.9 - 10.3 mg/dL 9.3 9.3 9.6  Total Protein 6.5 - 8.1 g/dL 8.0 7.7 7.8  Total Bilirubin 0.3 - 1.2 mg/dL 0.5 0.5 0.5  Alkaline Phos 38 - 126 U/L 72 64 -  AST 15 - 41 U/L '23 16 17  ' ALT 0 - 44 U/L 16 8 7(L)   Lab Results  Component Value Date   LDH 309 (H) 07/24/2020    RADIOGRAPHIC STUDIES: I have personally reviewed the radiological images as listed and agreed with the findings in the report. DG Chest 2 View  Result Date: 07/24/2020 CLINICAL DATA:  Chest pain, shortness of breath EXAM: CHEST - 2 VIEW COMPARISON:  09/17/2019 FINDINGS: Cardiomegaly. No confluent airspace opacities, effusions or edema. No acute bony abnormality. IMPRESSION: Cardiomegaly.  No active disease. Electronically Signed   By: Rolm Baptise M.D.   On: 07/24/2020 10:34   CT ABDOMEN PELVIS W CONTRAST  Result Date: 07/24/2020 CLINICAL DATA:  Acute generalized abdominal pain and distention. EXAM: CT ABDOMEN AND PELVIS WITH CONTRAST TECHNIQUE: Multidetector CT imaging of the abdomen and pelvis was performed using the standard protocol following bolus administration of intravenous contrast. CONTRAST:  124m OMNIPAQUE IOHEXOL 300 MG/ML  SOLN COMPARISON:  None. FINDINGS: Lower chest: No acute abnormality. Hepatobiliary: No focal liver abnormality is seen. No gallstones, gallbladder wall thickening, or biliary dilatation. Pancreas: There is a large lobulated and heterogeneous mass in the epigastric region which appears to be arising from or involving the pancreatic body and tail, measuring 13.8 by 11.2 cm. This is highly concerning for malignancy. Spleen: The mass described above appears to have medially abut the posterior spleen, and involvement of the spleen cannot be excluded.  Adrenals/Urinary Tract: Adrenal glands are unremarkable. Kidneys are normal, without  renal calculi, focal lesion, or hydronephrosis. Bladder is unremarkable. Stomach/Bowel: The stomach appears to be significantly displaced anteriorly by the previously described mass. No definite evidence of large or small bowel dilatation is noted. 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 Multiple other lobulated large masses are seen in the mesenteric region concerning for adenopathy, and potentially the previously described mass in left upper quadrant may represent adenopathy. 9.3 x 5.1 cm mass is noted in the inferior pelvis. 9.7 x 4.1 cm mass is seen more superiorly in the pelvis. Probable 7.3 x 5.8 cm mass is noted in the right upper quadrant. Reproductive: Prostate is unremarkable. Other: Mild amount of free fluid is noted in the pelvis. Musculoskeletal: No acute or significant osseous findings. IMPRESSION: Large lobulated and heterogeneous mass is noted in the epigastric region which measures 13.8 x 11.2 cm and is concerning for adenopathy or other malignancy; it appears to be involving the body and tail of the pancreas, and potentially may be arising from the pancreas. This mass does appear to surround branches of the celiac artery as well as the splenic vein. 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 Multiple other lobulated masses are seen in the mesenteric region concerning for adenopathy or malignancy or possibly lymphoma. Mild amount of free fluid is noted in the pelvis. Electronically Signed   By: Marijo Conception M.D.   On: 07/24/2020 13:16    ASSESSMENT:  1.   Diffuse abdominal and pelvic lymphadenopathy: -Presentation to the ER with left upper quadrant and mid abdominal pain for 2 weeks. -10 pound weight loss in the last 2 weeks due to decreased appetite.  Denies any fevers or night sweats. -CT AP with contrast on 07/24/2020 shows 13.8 11.2 cm mass in the epigastric region, lobulated large mass in the mesenteric region, 9.3 x 5.1 cm mass in the inferior pelvis, 9.7 x 4.1 cm mass in the superior pelvis.  Epigastric mass seems to abut posterior spleen. -LDH was elevated at 309, uric acid 10.2.  2.  Social/family history: -He quit smoking last year. -Family history significant for mother and father who had multiple myeloma.  Grandfather had colon cancer.  3.  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  4.  CHF: -Echocardiogram on 09/18/2019 shows EF 40-45%. -He is on Coreg 6.25 mg twice daily, Imdur 30 mg daily.  Lasix 40 mg is an expired medication list.  PLAN:  1.  Abdominal and pelvic lymphadenopathy: -I have reviewed images with the patient. -This is highly suspicious for non-Hodgkin's lymphoma especially in the setting of HIV/AIDS. -Physical examination does not show any palpable lymphadenopathy in the neck or axilla or inguinal region.  Labs that needs to be drawn: Hepatitis C antibody, hepatitis B surface antigen, hepatitis B surface antibody, hepatitis B core antibody -I have recommended a PET CT scan to identify the site of biopsy. -I will also check hepatitis B and C serology.  We will also check CD4 count. -We will also check another echocardiogram.  2.  Abdominal pain: -Continue Percocet 5/325 mg as needed.  3.  Nausea: -Continue Zofran ODT 4 mg every 8 hours as needed.  4.  Hyperuricemia: -Uric acid was elevated.  We started him on allopurinol 300 mg daily.    All questions were answered. The patient knows to call the clinic with any problems, questions or concerns.   Derek Jack, MD, 07/25/20 4:50 PM   [facility] [phone]   I, Milinda Antis, am acting as a scribe for Dr. Sanda Linger.  I, Derek Jack MD, have reviewed the above documentation for accuracy and completeness, and I agree with the above.

## 2020-07-26 ENCOUNTER — Other Ambulatory Visit (HOSPITAL_COMMUNITY): Payer: Self-pay

## 2020-07-26 ENCOUNTER — Other Ambulatory Visit (HOSPITAL_COMMUNITY): Payer: Self-pay | Admitting: Nurse Practitioner

## 2020-07-26 MED ORDER — OXYCODONE-ACETAMINOPHEN 5-325 MG PO TABS
1.0000 | ORAL_TABLET | Freq: Four times a day (QID) | ORAL | 0 refills | Status: DC | PRN
Start: 2020-07-26 — End: 2020-07-30

## 2020-07-30 ENCOUNTER — Ambulatory Visit (INDEPENDENT_AMBULATORY_CARE_PROVIDER_SITE_OTHER): Payer: 59 | Admitting: Internal Medicine

## 2020-07-30 ENCOUNTER — Encounter: Payer: Self-pay | Admitting: Internal Medicine

## 2020-07-30 ENCOUNTER — Encounter (HOSPITAL_COMMUNITY): Payer: Self-pay

## 2020-07-30 ENCOUNTER — Other Ambulatory Visit: Payer: Self-pay

## 2020-07-30 VITALS — BP 145/92 | HR 68 | Temp 97.9°F | Resp 18 | Ht 73.0 in | Wt 154.1 lb

## 2020-07-30 DIAGNOSIS — B20 Human immunodeficiency virus [HIV] disease: Secondary | ICD-10-CM | POA: Diagnosis not present

## 2020-07-30 DIAGNOSIS — Z7689 Persons encountering health services in other specified circumstances: Secondary | ICD-10-CM | POA: Diagnosis not present

## 2020-07-30 DIAGNOSIS — I5042 Chronic combined systolic (congestive) and diastolic (congestive) heart failure: Secondary | ICD-10-CM

## 2020-07-30 DIAGNOSIS — I10 Essential (primary) hypertension: Secondary | ICD-10-CM | POA: Diagnosis not present

## 2020-07-30 DIAGNOSIS — Z21 Asymptomatic human immunodeficiency virus [HIV] infection status: Secondary | ICD-10-CM

## 2020-07-30 DIAGNOSIS — R590 Localized enlarged lymph nodes: Secondary | ICD-10-CM

## 2020-07-30 MED ORDER — FUROSEMIDE 40 MG PO TABS: 40.0000 mg | ORAL_TABLET | Freq: Every day | ORAL | 3 refills | Status: DC

## 2020-07-30 MED ORDER — LOSARTAN POTASSIUM 50 MG PO TABS: 50.0000 mg | ORAL_TABLET | Freq: Every day | ORAL | 3 refills | Status: DC

## 2020-07-30 MED ORDER — DICYCLOMINE HCL 10 MG PO CAPS: 10.0000 mg | ORAL_CAPSULE | Freq: Three times a day (TID) | ORAL | 0 refills | Status: DC

## 2020-07-30 NOTE — Assessment & Plan Note (Signed)
BP 153/96, repeat 145/92 Mentions that he ran out of Lasix Noncompliant with medications Started Losartan considering h/o HFrEF Continue Coreg, Imdur and Lasix for CHF Advised for low-salt diet

## 2020-07-30 NOTE — Progress Notes (Signed)
New Patient Office Visit  Subjective:  Patient ID: Richard Cox, male    DOB: [birth year]  Age: 39 y.o. MRN: [id number]  CC:  Chief Complaint  Patient presents with  . New Patient (Initial Visit)    new pt hasnt had a primary since moving here from [place] 6 years ago. Pt was seen at [facility] ER 07-24-20 due to severe abdominal cramps they told him they found a large dark mass above pancreas he is scheduled for pet scan 07-31-20    HPI Richard Cox is a 39 year old male with past medical history of HIV, chronic combined systolic and diastolic heart failure, hypertension and recent ER visit for abdominal pain comes for establishing care.  In recent ER visit, CT scan of the abdomen showed mass around the pancreas with lymphadenopathy.  Patient visited oncologist after that, and is scheduled for PET scan tomorrow to assess for site for biopsy.  Patient still has abdominal cramps.  Patient has completed Percocet prescribed from ER.  Patient visited his cardiologist for CHF in 10/2019, but did not follow-up after that.  Patient has ran out of his Lasix medications, and appears to be noncompliant with his medications.  Patient follows with infectious disease specialist for management of HIV.  Patient takes Biktarvy regularly.  Past Medical History:  Diagnosis Date  . CHF (congestive heart failure) ([place])   . Essential hypertension   . HIV infection ([place])   . Noncompliance with medication regimen   . Secondary cardiomyopathy [facility])     Past Surgical History:  Procedure Laterality Date  . NO PAST SURGERIES      Family History  Problem Relation Age of Onset  . Diabetes Mellitus II Mother   . Cancer Mother        multiple myeloma  . Cancer Father        multiple myeloma  . Congestive Heart Failure Brother   . Heart Problems Brother   . Lupus Brother     Social History   Socioeconomic History  . Marital status: Single    Spouse name: Not on file  . Number of children: 0  . Years of  education: 56  . Highest education level: Not on file  Occupational History  . Not on file  Tobacco Use  . Smoking status: Former Smoker    Packs/day: 0.25    Types: Cigarettes    Quit date: 11/23/2018    Years since quitting: 1.6  . Smokeless tobacco: Never Used  Vaping Use  . Vaping Use: Never used  Substance and Sexual Activity  . Alcohol use: Not Currently  . Drug use: Yes    Types: Marijuana    Comment: x 1 week  . Sexual activity: Not Currently  Other Topics Concern  . Not on file  Social History Narrative  . Not on file   Social Determinants of Health   Financial Resource Strain:   . Difficulty of Paying Living Expenses: Not on file  Food Insecurity:   . Worried About Charity fundraiser in the Last Year: Not on file  . Ran Out of Food in the Last Year: Not on file  Transportation Needs:   . Lack of Transportation (Medical): Not on file  . Lack of Transportation (Non-Medical): Not on file  Physical Activity:   . Days of Exercise per Week: Not on file  . Minutes of Exercise per Session: Not on file  Stress:   . Feeling of Stress : Not on file  Social Connections:   . Frequency of Communication with Friends and Family: Not on file  . Frequency of Social Gatherings with Friends and Family: Not on file  . Attends Religious Services: Not on file  . Active Member of Clubs or Organizations: Not on file  . Attends Archivist Meetings: Not on file  . Marital Status: Not on file  Intimate Partner Violence:   . Fear of Current or Ex-Partner: Not on file  . Emotionally Abused: Not on file  . Physically Abused: Not on file  . Sexually Abused: Not on file    ROS Review of Systems  Constitutional: Negative for chills and fever.  HENT: Negative for congestion and sore throat.   Eyes: Negative for pain and discharge.  Respiratory: Negative for cough and shortness of breath.   Cardiovascular: Negative for chest pain and palpitations.  Gastrointestinal:  Positive for abdominal pain (Cramps) and constipation. Negative for diarrhea, nausea and vomiting.  Endocrine: Negative for polydipsia and polyuria.  Genitourinary: Negative for dysuria and hematuria.  Musculoskeletal: Negative for neck pain and neck stiffness.  Skin: Negative for rash.  Neurological: Negative for dizziness, weakness, numbness and headaches.  Psychiatric/Behavioral: Negative for agitation and behavioral problems.    Objective:   Today's Vitals: BP (!) 145/92 (BP Location: Right Arm, Patient Position: Sitting)   Pulse 68   Temp 97.9 F (36.6 C) (Temporal)   Resp 18   Ht '6\' 1"'  (1.854 m)   Wt 154 lb 1.9 oz (69.9 kg)   SpO2 99%   BMI 20.33 kg/m   Physical Exam Vitals reviewed.  Constitutional:      General: He is not in acute distress.    Appearance: He is not diaphoretic.  HENT:     Head: Normocephalic and atraumatic.     Nose: Nose normal.     Mouth/Throat:     Mouth: Mucous membranes are moist.  Eyes:     General: No scleral icterus.    Extraocular Movements: Extraocular movements intact.     Pupils: Pupils are equal, round, and reactive to light.  Cardiovascular:     Rate and Rhythm: Normal rate and regular rhythm.     Pulses: Normal pulses.     Heart sounds: No murmur heard.   Pulmonary:     Breath sounds: Normal breath sounds. No wheezing or rales.  Abdominal:     General: There is no distension.     Palpations: Abdomen is soft.     Tenderness: There is abdominal tenderness (Mild, generalized). There is no guarding or rebound.  Musculoskeletal:     Cervical back: Neck supple. No tenderness.     Right lower leg: No edema.     Left lower leg: No edema.  Skin:    General: Skin is warm.     Findings: No rash.  Neurological:     General: No focal deficit present.     Mental Status: He is alert and oriented to person, place, and time.  Psychiatric:        Mood and Affect: Mood normal.        Behavior: Behavior normal.     Assessment & Plan:     Encounter to establish care Care established History and medications reveiwed  Chronic combined systolic and diastolic CHF (congestive heart failure) ([place]) Last assessed Echo (09/2019): Left ventricular ejection fraction, by visual estimation, is 40 to 45%. LVH and myocardial thickening suggestive of infiltrative process.  Patient did not follow up with Cardiologist  after 10/2019. Takes Imdur, Lasix and Coreg, but noncompliant  Start Losartan 50 mg QD Advised to follow up with Cardiologist, referral provided  HIV (human immunodeficiency virus infection) ([place]) Follows up with Dr Elna Breslow On Bictarvy   HTN (hypertension) BP 153/96, repeat 145/92 Mentions that he ran out of Lasix Noncompliant with medications Started Losartan considering h/o HFrEF Continue Coreg, Imdur and Lasix for CHF Advised for low-salt diet  Intra-abdominal lymphadenopathy CT abdomen in ER showed large lobulated and heterogeneous mass is noted in the epigastric region which measures 13.8 x 11.2 cm and is concerning for adenopathy or other malignancy; it appears to be involving the body and tail of the pancreas, and potentially may be arising from the pancreas.  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 - F/u Hepatitis C antibody, hepatitis B surface antigen, hepatitis B surface antibody, hepatitis B core antibody - Scheduled for PET CT scan to identify the site of biopsy. - Follows with Oncologist     Flu vaccine administered in Office today.    Outpatient Encounter Medications as of 07/30/2020  Medication Sig  . acetaminophen (TYLENOL) 325 MG tablet Take 2 tablets (650 mg total) by mouth every 6 (six) hours as needed for mild pain, fever or headache (or Fever >/= 101).  Marland Kitchen allopurinol (ZYLOPRIM) 300 MG tablet Take 1 tablet (300 mg total) by mouth daily for 20 days.  . bictegravir-emtricitabine-tenofovir AF (BIKTARVY) 50-200-25 MG TABS tablet Take 1 tablet  by mouth daily.  . carvedilol (COREG) 6.25 MG tablet Take 1 tablet (6.25 mg total) by mouth 2 (two) times daily with a meal. For BP and Heart  . hydrALAZINE (APRESOLINE) 50 MG tablet Take 50 mg by mouth 3 (three) times daily.  . isosorbide mononitrate (IMDUR) 30 MG 24 hr tablet Take 30 mg by mouth daily.  . potassium chloride (KLOR-CON) 10 MEQ tablet Take 10 mEq by mouth daily.  Marland Kitchen dicyclomine (BENTYL) 10 MG capsule Take 1 capsule (10 mg total) by mouth 4 (four) times daily -  before meals and at bedtime for 15 days.  . furosemide (LASIX) 40 MG tablet Take 1 tablet (40 mg total) by mouth daily.  Marland Kitchen losartan (COZAAR) 50 MG tablet Take 1 tablet (50 mg total) by mouth daily.  . [DISCONTINUED] ferrous sulfate 325 (65 FE) MG tablet Take 1 tablet (325 mg total) by mouth daily with breakfast.  . [DISCONTINUED] furosemide (LASIX) 40 MG tablet Take 1 tablet (40 mg total) by mouth daily.  . [DISCONTINUED] ondansetron (ZOFRAN ODT) 4 MG disintegrating tablet Take 1 tablet (4 mg total) by mouth every 8 (eight) hours as needed.  . [DISCONTINUED] oxyCODONE-acetaminophen (PERCOCET/ROXICET) 5-325 MG tablet Take 1 tablet by mouth every 6 (six) hours as needed for severe pain.  . [DISCONTINUED] senna-docusate (SENOKOT-S) 8.6-50 MG tablet Take 1 tablet by mouth at bedtime as needed for mild constipation or moderate constipation.   No facility-administered encounter medications on file as of 07/30/2020.    Follow-up: Return in about 4 months (around 11/29/2020), or if symptoms worsen or fail to improve.   Lindell Spar, MD

## 2020-07-30 NOTE — Patient Instructions (Signed)
Please follow up with Oncologist and Cardiologist as discussed.  Please continue to take medications as advised.  Please follow low-sodium diet.  DASH stands for Dietary Approaches to Stop Hypertension. The DASH diet is a healthy-eating plan designed to help treat or prevent high blood pressure (hypertension).  The DASH diet includes foods that are rich in potassium, calcium and magnesium. These nutrients help control blood pressure. The diet limits foods that are high in sodium, saturated fat and added sugars.  Studies have shown that the DASH diet can lower blood pressure in as little as two weeks. The diet can also lower low-density lipoprotein (LDL or "bad") cholesterol levels in the blood. High blood pressure and high LDL cholesterol levels are two major risk factors for heart disease and stroke.    DASH diet: Recommended servings The DASH diet provides daily and weekly nutritional goals. The number of servings you should have depends on your daily calorie needs.  Here's a look at the recommended servings from each food group for a 2,000-calorie-a-day DASH diet:  Grains: 6 to 8 servings a day. One serving is one slice bread, 1 ounce dry cereal, or 1/2 cup cooked cereal, rice or pasta. Vegetables: 4 to 5 servings a day. One serving is 1 cup raw leafy green vegetable, 1/2 cup cut-up raw or cooked vegetables, or 1/2 cup vegetable juice. Fruits: 4 to 5 servings a day. One serving is one medium fruit, 1/2 cup fresh, frozen or canned fruit, or 1/2 cup fruit juice. Fat-free or low-fat dairy products: 2 to 3 servings a day. One serving is 1 cup milk or yogurt, or 1 1/2 ounces cheese. Lean meats, poultry and fish: six 1-ounce servings or fewer a day. One serving is 1 ounce cooked meat, poultry or fish, or 1 egg. Nuts, seeds and legumes: 4 to 5 servings a week. One serving is 1/3 cup nuts, 2 tablespoons peanut butter, 2 tablespoons seeds, or 1/2 cup cooked legumes (dried beans or peas). Fats and  oils: 2 to 3 servings a day. One serving is 1 teaspoon soft margarine, 1 teaspoon vegetable oil, 1 tablespoon mayonnaise or 2 tablespoons salad dressing. Sweets and added sugars: 5 servings or fewer a week. One serving is 1 tablespoon sugar, jelly or jam, 1/2 cup sorbet, or 1 cup lemonade.

## 2020-07-30 NOTE — Assessment & Plan Note (Signed)
Care established History and medications reveiwed

## 2020-07-30 NOTE — Assessment & Plan Note (Signed)
Follows up with Dr Elna Breslow On [facility]

## 2020-07-30 NOTE — Progress Notes (Unsigned)
Richard Cox Male, 39 y.o., Jun 25, 1981 MRN:  [id number] Phone:  [phone] Richard Cox) PCP:  Lindell Spar, MD Coverage:  [employer]/A[employer] Nap Next Appt With Radiology ([facility] PET) 07/31/2020 at 7:00 AM  RE: Biopsy (STAT) Received: 4 days ago Message Details  Arne Cleveland, MD  Lenore Cordia Ok   CT core peritoneal mass  Attn: venous collaterals in LUQ, sigmoid colon in pelvis complicate percutaneous approach. Presacral lesion may be best from L postlat approach   [facility]   Previous Messages  ----- Message -----  From: Lenore Cordia  Sent: 07/25/2020  4:29 PM EDT  To: Jillyn Hidden, Ir Procedure Requests  Subject: Biopsy (STAT)                   Procedure Requested: CT Biopsy    Reason for Procedure: abnormal scans, lymphadenopathy    Provider Requesting: Dr Lamonte Richer  Provider Telephone: [phone]    Other Info: Rad exams in Epic  Future: NM PET 9/22

## 2020-07-30 NOTE — Assessment & Plan Note (Signed)
Last assessed Echo (09/2019): Left ventricular ejection fraction, by visual estimation, is 40 to 45%. LVH and myocardial thickening suggestive of infiltrative process.  Patient did not follow up with Cardiologist after 10/2019. Takes Imdur, Lasix and Coreg, but noncompliant  Start Losartan 50 mg QD Advised to follow up with Cardiologist, referral provided

## 2020-07-30 NOTE — Assessment & Plan Note (Signed)
CT abdomen in ER showed large lobulated and heterogeneous mass is noted in the epigastric region which measures 13.8 x 11.2 cm and is concerning for adenopathy or other malignancy; it appears to be involving the body and tail of the pancreas, and potentially may be arising from the pancreas.  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 - F/u Hepatitis C antibody, hepatitis B surface antigen, hepatitis B surface antibody, hepatitis B core antibody - Scheduled for PET CT scan to identify the site of biopsy. - Follows with Oncologist

## 2020-07-31 ENCOUNTER — Encounter (HOSPITAL_COMMUNITY): Admission: RE | Admit: 2020-07-31 | Payer: 59 | Source: Ambulatory Visit

## 2020-07-31 ENCOUNTER — Ambulatory Visit (HOSPITAL_COMMUNITY)
Admission: RE | Admit: 2020-07-31 | Discharge: 2020-07-31 | Disposition: A | Discharge status: Home / Self Care | Payer: 59 | Source: Ambulatory Visit | Attending: Hematology | Admitting: Hematology

## 2020-07-31 DIAGNOSIS — R590 Localized enlarged lymph nodes: Secondary | ICD-10-CM | POA: Insufficient documentation

## 2020-07-31 DIAGNOSIS — R161 Splenomegaly, not elsewhere classified: Secondary | ICD-10-CM | POA: Insufficient documentation

## 2020-07-31 DIAGNOSIS — R918 Other nonspecific abnormal finding of lung field: Secondary | ICD-10-CM | POA: Insufficient documentation

## 2020-07-31 DIAGNOSIS — I517 Cardiomegaly: Secondary | ICD-10-CM | POA: Insufficient documentation

## 2020-07-31 LAB — GLUCOSE, CAPILLARY: Glucose-Capillary: 79 mg/dL (ref 70–99)

## 2020-07-31 MED ORDER — FLUDEOXYGLUCOSE F - 18 (FDG) INJECTION
7.9000 | Freq: Once | INTRAVENOUS | Status: AC | PRN
  Administered 2020-07-31: 7.9 via INTRAVENOUS

## 2020-07-31 MED FILL — BIKTARVY 50-200-25 MG TABS: 50-200-25 | 30 days supply | Qty: 30 | Fill #1

## 2020-08-01 ENCOUNTER — Inpatient Hospital Stay (HOSPITAL_BASED_OUTPATIENT_CLINIC_OR_DEPARTMENT_OTHER): Payer: 59 | Admitting: Hematology

## 2020-08-01 ENCOUNTER — Other Ambulatory Visit (HOSPITAL_COMMUNITY): Payer: Self-pay

## 2020-08-01 ENCOUNTER — Other Ambulatory Visit (HOSPITAL_COMMUNITY): Payer: Self-pay | Admitting: *Deleted

## 2020-08-01 ENCOUNTER — Other Ambulatory Visit: Payer: Self-pay

## 2020-08-01 VITALS — BP 154/94 | HR 90 | Temp 96.9°F | Resp 18 | Wt 152.2 lb

## 2020-08-01 DIAGNOSIS — R591 Generalized enlarged lymph nodes: Secondary | ICD-10-CM | POA: Diagnosis not present

## 2020-08-01 DIAGNOSIS — R1906 Epigastric swelling, mass or lump: Secondary | ICD-10-CM | POA: Diagnosis not present

## 2020-08-01 DIAGNOSIS — R590 Localized enlarged lymph nodes: Secondary | ICD-10-CM | POA: Diagnosis not present

## 2020-08-01 MED ORDER — OXYCODONE-ACETAMINOPHEN 5-325 MG PO TABS: 1.0000 | ORAL_TABLET | Freq: Three times a day (TID) | ORAL | 0 refills | Status: DC | PRN

## 2020-08-01 NOTE — Patient Instructions (Signed)
[place] at [facility] Discharge Instructions  You were seen today by Dr. Delton Coombes. He went over your recent results and scans. You will be referred to radiology for a biopsy. Dr. Delton Coombes will see you back on October 5th for labs and follow up.   Thank you for choosing [place] at [facility] to provide your oncology and hematology care.  To afford each patient quality time with our provider, please arrive at least 15 minutes before your scheduled appointment time.   If you have a lab appointment with the [facility] please come in thru the Main Entrance and check in at the main information desk  You need to re-schedule your appointment should you arrive 10 or more minutes late.  We strive to give you quality time with our providers, and arriving late affects you and other patients whose appointments are after yours.  Also, if you no show three or more times for appointments you may be dismissed from the clinic at the providers discretion.     Again, thank you for choosing [facility].  Our hope is that these requests will decrease the amount of time that you wait before being seen by our physicians.       _____________________________________________________________  Should you have questions after your visit to [facility], please contact our office at [phone] between the hours of 8:00 a.m. and 4:30 p.m.  Voicemails left after 4:00 p.m. will not be returned until the following business day.  For prescription refill requests, have your pharmacy contact our office and allow 72 hours.    Cancer Center Support Programs:   > Cancer Support Group  2nd Tuesday of the month 1pm-2pm, Journey Room

## 2020-08-01 NOTE — Progress Notes (Signed)
[place],  [id number]   CLINIC:  Medical Oncology/Hematology  PCP:  Lindell Spar, MD [address] / [place] [address]  [phone]  REASON FOR VISIT:  Follow-up for diffuse abdominal and pelvic lymphadenopathy  PRIOR THERAPY: None  CURRENT THERAPY: Under work-up  INTERVAL HISTORY:  [name], a 39 y.o. male, returns for routine follow-up for his diffuse abdominal and pelvic lymphadenopathy. Joss was last seen on 07/25/2020.  Today he complains of having abdominal pain this morning like before lasting for several hours this morning, but resolved by the time he came to the clinic. He reports that his pain is worse at night and he has to take pain meds to go to sleep. His abdominal pain eases up slightly when he has a BM.   REVIEW OF SYSTEMS:  Review of Systems  Constitutional: Positive for appetite change (severely decreased) and fatigue (mild).  Gastrointestinal: Positive for abdominal pain (10/10 abdominal pain after eating) and constipation.  All other systems reviewed and are negative.   PAST MEDICAL/SURGICAL HISTORY:  Past Medical History:  Diagnosis Date   CHF (congestive heart failure) (HCC)    Essential hypertension    HIV infection ([place])    Noncompliance with medication regimen    Secondary cardiomyopathy ([place])    Past Surgical History:  Procedure Laterality Date   NO PAST SURGERIES      SOCIAL HISTORY:  Social History   Socioeconomic History   Marital status: Single    Spouse name: Not on file   Number of children: 0   Years of education: 13   Highest education level: Not on file  Occupational History   Not on file  Tobacco Use   Smoking status: Former Smoker    Packs/day: 0.25    Types: Cigarettes    Quit date: 11/23/2018    Years since quitting: 1.6   Smokeless tobacco: Never Used  Vaping Use   Vaping Use: Never used  Substance and Sexual Activity   Alcohol use: Not  Currently   Drug use: Yes    Types: Marijuana    Comment: x 1 week   Sexual activity: Not Currently  Other Topics Concern   Not on file  Social History Narrative   Not on file   Social Determinants of Health   Financial Resource Strain:    Difficulty of Paying Living Expenses: Not on file  Food Insecurity:    Worried About Charity fundraiser in the Last Year: Not on file   [employer] of Food in the Last Year: Not on file  Transportation Needs:    Lack of Transportation (Medical): Not on file   Lack of Transportation (Non-Medical): Not on file  Physical Activity:    Days of Exercise per Week: Not on file   Minutes of Exercise per Session: Not on file  Stress:    Feeling of Stress : Not on file  Social Connections:    Frequency of Communication with Friends and Family: Not on file   Frequency of Social Gatherings with Friends and Family: Not on file   Attends Religious Services: Not on file   Active Member of Clubs or Organizations: Not on file   Attends Archivist Meetings: Not on file   Marital Status: Not on file  Intimate Partner Violence:    Fear of Current or Ex-Partner: Not on file   Emotionally Abused: Not on file   Physically Abused:  Not on file   Sexually Abused: Not on file    FAMILY HISTORY:  Family History  Problem Relation Age of Onset   Diabetes Mellitus II Mother    Cancer Mother        multiple myeloma   Cancer Father        multiple myeloma   Congestive Heart Failure Brother    Heart Problems Brother    Lupus Brother     CURRENT MEDICATIONS:  Current Outpatient Medications  Medication Sig Dispense Refill   acetaminophen (TYLENOL) 325 MG tablet Take 2 tablets (650 mg total) by mouth every 6 (six) hours as needed for mild pain, fever or headache (or Fever >/= 101). 12 tablet 0   allopurinol (ZYLOPRIM) 300 MG tablet Take 1 tablet (300 mg total) by mouth daily for 20 days. 20 tablet 0    bictegravir-emtricitabine-tenofovir AF (BIKTARVY) 50-200-25 MG TABS tablet Take 1 tablet by mouth daily. 30 tablet 5   carvedilol (COREG) 6.25 MG tablet Take 1 tablet (6.25 mg total) by mouth 2 (two) times daily with a meal. For BP and Heart (Patient taking differently: Take 6.25 mg by mouth 2 (two) times daily with a meal. ) 60 tablet 11   dicyclomine (BENTYL) 10 MG capsule Take 1 capsule (10 mg total) by mouth 4 (four) times daily -  before meals and at bedtime for 15 days. 60 capsule 0   docusate sodium (COLACE) 100 MG capsule Take 100 mg by mouth daily as needed for mild constipation.     furosemide (LASIX) 40 MG tablet Take 1 tablet (40 mg total) by mouth daily. 30 tablet 3   hydrALAZINE (APRESOLINE) 50 MG tablet Take 50 mg by mouth 3 (three) times daily.     isosorbide mononitrate (IMDUR) 30 MG 24 hr tablet Take 30 mg by mouth daily.     losartan (COZAAR) 50 MG tablet Take 1 tablet (50 mg total) by mouth daily. 90 tablet 3   potassium chloride (KLOR-CON) 10 MEQ tablet Take 10 mEq by mouth daily.     No current facility-administered medications for this visit.    ALLERGIES:  Allergies  Allergen Reactions   Neosporin [Neomycin-Bacitracin Zn-Polymyx] Rash    PHYSICAL EXAM:  Performance status (ECOG): 0 - Asymptomatic  Vitals:   08/01/20 1508  BP: (!) 154/94  Pulse: 90  Resp: 18  Temp: (!) 96.9 F (36.1 C)  SpO2: 96%   Wt Readings from Last 3 Encounters:  08/01/20 152 lb 3.2 oz (69 kg)  07/30/20 154 lb 1.9 oz (69.9 kg)  07/25/20 154 lb 11.2 oz (70.2 kg)   Physical Exam Vitals reviewed.  Constitutional:      Appearance: Normal appearance.  Abdominal:     Palpations: There is mass (L epigastric mass; mass to the R of umbilicus).     Tenderness: There is abdominal tenderness in the epigastric area.  Neurological:     General: No focal deficit present.     Mental Status: He is alert and oriented to person, place, and time.  Psychiatric:        Mood and Affect:  Mood normal.        Behavior: Behavior normal.     LABORATORY DATA:  I have reviewed the labs as listed.  CBC Latest Ref Rng & Units 07/24/2020 07/22/2020 03/06/2020  WBC 4.0 - 10.5 K/uL 5.5 4.5 3.7(L)  Hemoglobin 13.0 - 17.0 g/dL 13.0 12.2(L) 12.7(L)  Hematocrit 39 - 52 % 40.6 37.3(L) 37.5(L)  Platelets 150 -  400 K/uL 239 184 157   CMP Latest Ref Rng & Units 07/24/2020 07/22/2020 07/11/2020  Glucose 70 - 99 mg/dL 97 97 82  BUN 6 - 20 mg/dL 21(H) 17 16  Creatinine 0.61 - 1.24 mg/dL 1.36(H) 0.97 1.25  Sodium 135 - 145 mmol/L 137 140 139  Potassium 3.5 - 5.1 mmol/L 3.8 3.9 4.5  Chloride 98 - 111 mmol/L 101 103 104  CO2 22 - 32 mmol/L '26 25 26  ' Calcium 8.9 - 10.3 mg/dL 9.3 9.3 9.6  Total Protein 6.5 - 8.1 g/dL 8.0 7.7 7.8  Total Bilirubin 0.3 - 1.2 mg/dL 0.5 0.5 0.5  Alkaline Phos 38 - 126 U/L 72 64 -  AST 15 - 41 U/L '23 16 17  ' ALT 0 - 44 U/L 16 8 7(L)      Component Value Date/Time   RBC 4.58 07/24/2020 1040   MCV 88.6 07/24/2020 1040   MCH 28.4 07/24/2020 1040   MCHC 32.0 07/24/2020 1040   RDW 14.6 07/24/2020 1040   LYMPHSABS 1,669 03/06/2020 0933   MONOABS 0.4 03/11/2019 0409   EOSABS 41 03/06/2020 0933   BASOSABS 19 03/06/2020 0933    DIAGNOSTIC IMAGING:  I have independently reviewed the scans and discussed with the patient. DG Chest 2 View  Result Date: 07/24/2020 CLINICAL DATA:  Chest pain, shortness of breath EXAM: CHEST - 2 VIEW COMPARISON:  09/17/2019 FINDINGS: Cardiomegaly. No confluent airspace opacities, effusions or edema. No acute bony abnormality. IMPRESSION: Cardiomegaly.  No active disease. Electronically Signed   By: Rolm Baptise M.D.   On: 07/24/2020 10:34   CT ABDOMEN PELVIS W CONTRAST  Result Date: 07/24/2020 CLINICAL DATA:  Acute generalized abdominal pain and distention. EXAM: CT ABDOMEN AND PELVIS WITH CONTRAST TECHNIQUE: Multidetector CT imaging of the abdomen and pelvis was performed using the standard protocol following bolus administration of  intravenous contrast. CONTRAST:  123m OMNIPAQUE IOHEXOL 300 MG/ML  SOLN COMPARISON:  None. FINDINGS: Lower chest: No acute abnormality. Hepatobiliary: No focal liver abnormality is seen. No gallstones, gallbladder wall thickening, or biliary dilatation. Pancreas: There is a large lobulated and heterogeneous mass in the epigastric region which appears to be arising from or involving the pancreatic body and tail, measuring 13.8 by 11.2 cm. This is highly concerning for malignancy. Spleen: The mass described above appears to have medially abut the posterior spleen, and involvement of the spleen cannot be excluded. Adrenals/Urinary Tract: Adrenal glands are unremarkable. Kidneys are normal, without renal calculi, focal lesion, or hydronephrosis. Bladder is unremarkable. Stomach/Bowel: The stomach appears to be significantly displaced anteriorly by the previously described mass. No definite evidence of large or small bowel dilatation is noted. 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 Multiple other lobulated large masses are seen in the mesenteric region concerning for adenopathy, and potentially the previously described mass in left upper quadrant may represent adenopathy. 9.3 x 5.1 cm mass is noted in the inferior pelvis. 9.7 x 4.1 cm mass is seen more superiorly in the pelvis. Probable 7.3 x 5.8 cm mass is noted in the right upper quadrant. Reproductive: Prostate is unremarkable. Other: Mild amount of free fluid is noted in the pelvis. Musculoskeletal: No acute or significant osseous findings. IMPRESSION: Large lobulated and heterogeneous mass is noted in the epigastric region which measures 13.8 x 11.2  cm and is concerning for adenopathy or other malignancy; it appears to be involving  the body and tail of the pancreas, and potentially may be arising from the pancreas. This mass does appear to surround branches of the celiac artery as well as the splenic vein. 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 Multiple other lobulated masses are seen in the mesenteric region concerning for adenopathy or malignancy or possibly lymphoma. Mild amount of free fluid is noted in the pelvis. Electronically Signed   By: Marijo Conception M.D.   On: 07/24/2020 13:16   NM PET Image Initial (PI) Skull Base To Thigh  Result Date: 07/31/2020 CLINICAL DATA:  Initial treatment strategy for adenopathy. EXAM: NUCLEAR MEDICINE PET SKULL BASE TO THIGH TECHNIQUE: 7.9 mCi F-18 FDG was injected intravenously. Full-ring PET imaging was performed from the skull base to thigh after the radiotracer. CT data was obtained and used for attenuation correction and anatomic localization. Fasting blood glucose: 79 mg/dl COMPARISON:  CT AP 07/24/2020 FINDINGS: Mediastinal blood pool activity: SUV max 1.24 Liver activity: SUV max NA NECK: No hypermetabolic lymph nodes in the neck. Incidental CT findings: none CHEST: Mild diffuse tracer uptake noted within both lobes of thyroid gland. Tiny lymph node in the right axilla exhibits mild tracer uptake with SUV max of 4.21. Likely related to right upper extremity radiotracer injection site. No FDG avid mediastinal or hilar adenopathy. Triangular-shaped soft tissue density within the anterior mediastinum exhibits mild tracer uptake with SUV max of 2.51 and likely reflects thymic hyperplasia. Broad-based pleural base nodule overlying the right middle lobe measures 9 mm with SUV max of 4.24. Small scattered lung nodules (approximately 10) are identified, nonspecific these are all too  small to reliably characterize by PET-CT. Example nodule in the superior segment of right lower lobe measures 4 mm, image 63/4. No pleural effusion, airspace consolidation, or atelectasis. Incidental CT findings: Cardiac enlargement. No aortic atherosclerosis or coronary artery calcifications. ABDOMEN/PELVIS: No abnormal FDG uptake identified within the liver, spleen, adrenal glands, or kidneys. The spleen is enlarged with a cranial caudal dimension of 14.8 cm. No focal areas of abnormal uptake identified within the spleen. Multifocal FDG avid soft tissue masses are identified within the abdomen or pelvis. Index lesions include: -Intense FDG uptake is associated with the left upper quadrant mass which appears closely associated with the tail of pancreas. This mass measures 13.8 x 12.5 cm with SUV max of 19.35, image 121/4. -Soft tissue mass within the small bowel mesentery (eccentric to the right) measures 8.9 x 6.2 cm with SUV max of 15.57, image 147/4. -Left lower quadrant small bowel mesenteric mass measures 5.1 x 3.7 cm within SUV max of 14.29, image 159/4. -Soft tissue mass within the right side of pelvis measures 10.4 x 4.6 cm with SUV max of 14.10, image 173/4. -Mass within left pelvis measures 9.2 x 4.6 cm within SUV max of 20.7, image 177/4. -Along the right pericolic gutter there is a soft tissue mass measuring 2.8 x 3.2 cm with SUV max of 17.48, image 157/4. -Mass overlying the right hepatic lobe measures 2.9 x 1.8 cm and has an SUV max of 16.19. Incidental CT findings: none SKELETON: No focal hypermetabolic activity to suggest skeletal metastasis. Incidental CT findings: none IMPRESSION: 1. Extensive, multifocal FDG avid  soft tissue masses are identified within the abdomen and pelvis. Suspect lymphoproliferative disorder. Correlation with soft tissue sampling recommended. 2. Additional, FDG avid subpleural nodules overlying the right lung are noted also concerning for sites of lymphoproliferative  disorder. 3. Splenomegaly. 4. Multiple small scattered lung nodules are identified in both lungs. The appearance of these nodules is nonspecific. These are all too small to reliably characterize by PET-CT. Attention on follow-up imaging advised. 5. Cardiac enlargement. Electronically Signed   By: Kerby Moors M.D.   On: 07/31/2020 09:14     ASSESSMENT:  1.  Diffuse abdominal and pelvic lymphadenopathy: -Presentation to the ER with left upper quadrant and mid abdominal pain for 2 weeks. -10 pound weight loss in the last 2 weeks due to decreased appetite.  Denies any fevers or night sweats. -CT AP with contrast on 07/24/2020 shows 13.8 11.2 cm mass in the epigastric region, lobulated large mass in the mesenteric region, 9.3 x 5.1 cm mass in the inferior pelvis, 9.7 x 4.1 cm mass in the superior pelvis.  Epigastric mass seems to abut posterior spleen. -LDH was elevated at 309, uric acid 10.2. -PET scan on 07/31/2020 showed extensive multifocal soft tissue masses within the abdomen and pelvis, most of them between SUV 15 and 20.  FDG avid subpleural nodules overlying the right lung.  Splenomegaly.  Multiple scattered lung nodules, nonspecific.  2.  Social/family history: -He quit smoking last year. -Family history significant for mother and father who had multiple myeloma.  Grandfather had colon cancer.  3.  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  4.  CHF: -Echocardiogram on 09/18/2019 shows EF 40-45%. -He is on Coreg 6.25 mg twice daily, Imdur 30 mg daily.  Lasix 40 mg is an expired medication list.   PLAN:  1.  Abdominal and pelvic lymphadenopathy: -I have recommended checking for hepatitis serology and CD4 count. -We discussed images of the PET scan with the patient in detail.  I have recommended biopsy of the abdominal masses which are clearly palpable, as they have high SUV value. -There is a mass palpable in the epigastric region as well as lateral to the right side of  the umbilicus. -I will see him back after the biopsy.  2.  Abdominal pain: -Will increase Percocet 5/325 every 8 hours as needed.  He is taking 1 tablet in the morning and 2 tablets at bedtime.  I will send a refill.  3.  Nausea: -Continue Zofran ODT 4 mg every 8 hours as needed.  4.  Hyperuricemia: -I have started him on allopurinol 300 mg daily.  Orders placed this encounter:  No orders of the defined types were placed in this encounter.    Derek Jack, MD [place] [phone]   I, Milinda Antis, am acting as a scribe for Dr. Sanda Linger.  I, Derek Jack MD, have reviewed the above documentation for accuracy and completeness, and I agree with the above.

## 2020-08-05 ENCOUNTER — Ambulatory Visit (HOSPITAL_COMMUNITY): Payer: 59 | Admitting: Hematology

## 2020-08-05 ENCOUNTER — Inpatient Hospital Stay (HOSPITAL_COMMUNITY): Payer: 59

## 2020-08-05 ENCOUNTER — Other Ambulatory Visit (HOSPITAL_COMMUNITY): Payer: 59

## 2020-08-05 ENCOUNTER — Other Ambulatory Visit: Payer: Self-pay

## 2020-08-05 DIAGNOSIS — R1906 Epigastric swelling, mass or lump: Secondary | ICD-10-CM | POA: Diagnosis not present

## 2020-08-05 DIAGNOSIS — R591 Generalized enlarged lymph nodes: Secondary | ICD-10-CM

## 2020-08-05 LAB — HEPATITIS C ANTIBODY: HCV Ab: NONREACTIVE

## 2020-08-05 LAB — HEPATITIS B SURFACE ANTIGEN: Hepatitis B Surface Ag: NONREACTIVE

## 2020-08-05 LAB — HEPATITIS B CORE ANTIBODY, TOTAL: Hep B Core Total Ab: NONREACTIVE

## 2020-08-05 LAB — HEPATITIS B SURFACE ANTIBODY,QUALITATIVE: Hep B S Ab: NONREACTIVE

## 2020-08-05 LAB — URIC ACID: Uric Acid, Serum: 4.2 mg/dL (ref 3.7–8.6)

## 2020-08-06 ENCOUNTER — Telehealth (HOSPITAL_COMMUNITY): Payer: Self-pay | Admitting: Surgery

## 2020-08-06 ENCOUNTER — Other Ambulatory Visit: Payer: Self-pay | Admitting: Radiology

## 2020-08-06 ENCOUNTER — Other Ambulatory Visit (HOSPITAL_COMMUNITY): Payer: Self-pay | Admitting: Hematology

## 2020-08-06 ENCOUNTER — Ambulatory Visit (HOSPITAL_COMMUNITY): Payer: 59

## 2020-08-06 LAB — T-HELPER CELLS (CD4) COUNT (NOT AT ARMC)
CD4 % Helper T Cell: 19 % — ABNORMAL LOW (ref 33–65)
CD4 T Cell Abs: 153 /uL — ABNORMAL LOW (ref 400–1790)

## 2020-08-06 LAB — BETA 2 MICROGLOBULIN, SERUM: Beta-2 Microglobulin: 2.3 mg/L (ref 0.6–2.4)

## 2020-08-06 MED ORDER — OXYCODONE-ACETAMINOPHEN 10-325 MG PO TABS: 1.0000 | ORAL_TABLET | Freq: Four times a day (QID) | ORAL | 0 refills | Status: DC | PRN

## 2020-08-06 NOTE — Telephone Encounter (Signed)
The pt called this morning and stated that he was still having severe pain in the left side of his abdomen and that he is having to take 4-5 oxycodone tablets daily and only has 1 tablet left.  Also, the pt had questions about his bx tomorrow.  Dr. Delton Coombes increased the pt's oxycodone to 10/325 and sent the prescription in today.  I told the pt that per the bx appointment notes, he is to have nothing to eat/drink after midnight, and he is also not supposed to take any aspirin or blood thinners.  Also, the pt was told to have someone drive him for the appointment since he will have light sedation.  Pt verbalized understanding that the strength of his pain medication was increased, and he also verbalized that he understood the instructions for his bx.

## 2020-08-07 ENCOUNTER — Ambulatory Visit (HOSPITAL_COMMUNITY)
Admission: RE | Admit: 2020-08-07 | Discharge: 2020-08-07 | Disposition: A | Discharge status: Home / Self Care | Payer: 59 | Source: Ambulatory Visit | Attending: Hematology | Admitting: Hematology

## 2020-08-07 ENCOUNTER — Other Ambulatory Visit: Payer: Self-pay

## 2020-08-07 ENCOUNTER — Ambulatory Visit: Payer: Self-pay | Admitting: Family Medicine

## 2020-08-07 ENCOUNTER — Encounter (HOSPITAL_COMMUNITY): Payer: Self-pay

## 2020-08-07 DIAGNOSIS — R590 Localized enlarged lymph nodes: Secondary | ICD-10-CM

## 2020-08-07 NOTE — Progress Notes (Signed)
Attempted to call pt at [phone] (M) regarding appt today for biopsy. No answer, unable to leave message.

## 2020-08-08 ENCOUNTER — Encounter (HOSPITAL_COMMUNITY): Payer: Self-pay

## 2020-08-09 ENCOUNTER — Other Ambulatory Visit (HOSPITAL_COMMUNITY): Payer: Self-pay | Admitting: *Deleted

## 2020-08-09 DIAGNOSIS — R591 Generalized enlarged lymph nodes: Secondary | ICD-10-CM

## 2020-08-09 DIAGNOSIS — R590 Localized enlarged lymph nodes: Secondary | ICD-10-CM

## 2020-08-12 ENCOUNTER — Telehealth (HOSPITAL_COMMUNITY): Payer: Self-pay | Admitting: *Deleted

## 2020-08-12 ENCOUNTER — Other Ambulatory Visit (HOSPITAL_COMMUNITY): Payer: Self-pay | Admitting: *Deleted

## 2020-08-13 ENCOUNTER — Encounter (HOSPITAL_COMMUNITY): Payer: Self-pay | Admitting: *Deleted

## 2020-08-13 ENCOUNTER — Other Ambulatory Visit: Payer: Self-pay | Admitting: Radiology

## 2020-08-13 ENCOUNTER — Other Ambulatory Visit (HOSPITAL_COMMUNITY): Payer: Self-pay | Admitting: *Deleted

## 2020-08-13 ENCOUNTER — Ambulatory Visit (HOSPITAL_COMMUNITY): Payer: 59 | Admitting: Hematology

## 2020-08-13 MED ORDER — OXYCODONE-ACETAMINOPHEN 10-325 MG PO TABS
1.0000 | ORAL_TABLET | ORAL | 0 refills | Status: DC | PRN
Start: 2020-08-13 — End: 2020-09-23

## 2020-08-13 NOTE — Progress Notes (Signed)
Patient called clinic stating that he is having increased pain.  He states that it hurts worse with eating.  He reports that he has had to take more of his pain medications than he should have.  He reports that the medication is lasting 1-2 hours and then he has to take another.  He reports taking about 6-7 daily.    After talking with Dr. Delton Coombes about this, he wants patient to try taking Maalox prior to meals.  He is going to increase his pain medication to every 4 hours.    I have called patient and advised him of this and he verbalizes understanding.

## 2020-08-14 ENCOUNTER — Other Ambulatory Visit: Payer: Self-pay | Admitting: Radiology

## 2020-08-14 ENCOUNTER — Ambulatory Visit (HOSPITAL_COMMUNITY): Admission: RE | Admit: 2020-08-14 | Payer: 59 | Source: Ambulatory Visit

## 2020-08-15 ENCOUNTER — Other Ambulatory Visit: Payer: Self-pay

## 2020-08-15 ENCOUNTER — Ambulatory Visit (HOSPITAL_COMMUNITY)
Admission: RE | Admit: 2020-08-15 | Discharge: 2020-08-15 | Disposition: A | Discharge status: Home / Self Care | Payer: 59 | Source: Ambulatory Visit | Attending: Hematology | Admitting: Hematology

## 2020-08-15 DIAGNOSIS — I110 Hypertensive heart disease with heart failure: Secondary | ICD-10-CM | POA: Insufficient documentation

## 2020-08-15 DIAGNOSIS — I509 Heart failure, unspecified: Secondary | ICD-10-CM | POA: Insufficient documentation

## 2020-08-15 DIAGNOSIS — Z79899 Other long term (current) drug therapy: Secondary | ICD-10-CM | POA: Diagnosis not present

## 2020-08-15 DIAGNOSIS — B20 Human immunodeficiency virus [HIV] disease: Secondary | ICD-10-CM | POA: Insufficient documentation

## 2020-08-15 DIAGNOSIS — R590 Localized enlarged lymph nodes: Secondary | ICD-10-CM | POA: Insufficient documentation

## 2020-08-15 LAB — CBC
HCT: 34.9 % — ABNORMAL LOW (ref 39.0–52.0)
Hemoglobin: 10.9 g/dL — ABNORMAL LOW (ref 13.0–17.0)
MCH: 27.3 pg (ref 26.0–34.0)
MCHC: 31.2 g/dL (ref 30.0–36.0)
MCV: 87.5 fL (ref 80.0–100.0)
Platelets: 215 10*3/uL (ref 150–400)
RBC: 3.99 MIL/uL — ABNORMAL LOW (ref 4.22–5.81)
RDW: 15.4 % (ref 11.5–15.5)
WBC: 5.7 10*3/uL (ref 4.0–10.5)
nRBC: 0 % (ref 0.0–0.2)

## 2020-08-15 LAB — PROTIME-INR
INR: 1.2 (ref 0.8–1.2)
Prothrombin Time: 14.4 seconds (ref 11.4–15.2)

## 2020-08-15 MED ORDER — FENTANYL CITRATE (PF) 100 MCG/2ML IJ SOLN
INTRAMUSCULAR | Status: AC
  Filled 2020-08-15: qty 2

## 2020-08-15 MED ORDER — MIDAZOLAM HCL 2 MG/2ML IJ SOLN
INTRAMUSCULAR | Status: AC | PRN
  Administered 2020-08-15: 0.5 mg via INTRAVENOUS
  Administered 2020-08-15: 1 mg via INTRAVENOUS

## 2020-08-15 MED ORDER — MIDAZOLAM HCL 2 MG/2ML IJ SOLN
INTRAMUSCULAR | Status: AC
  Filled 2020-08-15: qty 2

## 2020-08-15 MED ORDER — LIDOCAINE HCL 1 % IJ SOLN
INTRAMUSCULAR | Status: AC
  Filled 2020-08-15: qty 20

## 2020-08-15 MED ORDER — SODIUM CHLORIDE 0.9 % IV SOLN: INTRAVENOUS | Status: DC

## 2020-08-15 MED ORDER — FENTANYL CITRATE (PF) 100 MCG/2ML IJ SOLN
INTRAMUSCULAR | Status: AC | PRN
  Administered 2020-08-15 (×2): 50 ug via INTRAVENOUS

## 2020-08-15 MED ORDER — FENTANYL CITRATE (PF) 100 MCG/2ML IJ SOLN
INTRAMUSCULAR | Status: DC
Start: 2020-08-15 — End: 2020-08-15
  Filled 2020-08-15: qty 2

## 2020-08-15 NOTE — Procedures (Signed)
Interventional Radiology Procedure Note  Procedure: US guided biopsy of peritoneal mass, subxiphoid approach Complications: None EBL: None Recommendations: - Bedrest 1 hours.   - Routine wound care - Follow up pathology - Advance diet   Signed,  Corrie Mckusick, DO

## 2020-08-15 NOTE — H&P (Signed)
Chief Complaint: Patient was seen in consultation today for a presacral or peritoneal lesion biopsy.   Referring Physician(s): Derek Jack  Supervising Physician: Corrie Mckusick  Patient Status: [facility] - Out-pt  History of Present Illness: Richard Cox is a 39 y.o. male with a medical history significant for CHF, HTN and HIV infection. He presented to the ED 07/22/20 with abdominal pain and distention. CT imaging was obtained.   CT abdomen/pelvis 07/24/20: IMPRESSION: Large lobulated and heterogeneous mass is noted in the epigastric region which measures 13.8 x 11.2 cm and is concerning for adenopathy or other malignancy; it appears to be involving the body and tail of the pancreas, and potentially may be arising from the pancreas. This mass does appear to surround branches of the celiac artery as well as the splenic vein. 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 Multiple other lobulated masses are seen in the mesenteric region concerning for adenopathy or malignancy or possibly lymphoma. Mild amount of free fluid is noted in the pelvis.  Interventional Radiology has been asked to evaluate this patient for an image-guided biopsy of a presacral or peritoneal lesion. This case has been reviewed and procedure approved by Dr. Vernard Gambles.   Past Medical History:  Diagnosis Date  . CHF (congestive heart failure) ([place])   . Essential hypertension   . HIV infection ([place])   . Noncompliance with medication regimen   . 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 NO PAST SURGERIES      Allergies: Neosporin [neomycin-bacitracin zn-polymyx]  Medications: Prior to Admission medications   Medication Sig Start Date End Date  Taking? Authorizing Provider  acetaminophen (TYLENOL) 325 MG tablet Take 2 tablets (650 mg total) by mouth every 6 (six) hours as needed for mild pain, fever or headache (or Fever >/= 101). 09/19/19  Yes Emokpae, Courage, MD  allopurinol (ZYLOPRIM) 300 MG tablet Take 1 tablet (300 mg total) by mouth daily for 20 days. 07/24/20 08/15/20 Yes Long, Wonda Olds, MD  bictegravir-emtricitabine-tenofovir AF (BIKTARVY) 50-200-25 MG TABS tablet Take 1 tablet by mouth daily. 04/04/20  Yes Golden Circle, FNP  carvedilol (COREG) 6.25 MG tablet Take 1 tablet (6.25 mg total) by mouth 2 (two) times daily with a meal. For BP and Heart Patient taking differently: Take 6.25 mg by mouth 2 (two) times daily with a meal.  09/19/19  Yes Emokpae, Courage, MD  docusate sodium (COLACE) 100 MG capsule Take 100 mg by mouth daily as needed for mild constipation.   Yes [provider]  furosemide (LASIX) 40 MG tablet Take 1 tablet (40 mg total) by mouth daily. 07/30/20 11/27/20 Yes Lindell Spar, MD  hydrALAZINE (APRESOLINE) 50 MG tablet Take 50 mg by mouth 3 (three) times daily. 07/22/20  Yes [provider]  isosorbide mononitrate (IMDUR) 30 MG 24 hr tablet Take 30 mg by mouth daily. 04/30/20  Yes [provider]  losartan (COZAAR) 50 MG tablet Take 1 tablet (50 mg total) by mouth daily. 07/30/20  Yes Lindell Spar, MD  oxyCODONE-acetaminophen (PERCOCET) 10-325 MG tablet Take 1 tablet by mouth every 4 (four) hours as needed for pain. 08/13/20  Yes Derek Jack, MD  potassium chloride (KLOR-CON) 10 MEQ tablet Take 10 mEq by mouth daily.  04/30/20  Yes [provider]  dicyclomine (BENTYL) 10 MG capsule Take 1 capsule (10 mg total) by mouth 4 (four) times daily -  before meals and at bedtime for 15 days. 07/30/20 08/14/20  Lindell Spar, MD     Family History  Problem Relation Age of Onset  . Diabetes Mellitus II Mother   . Cancer Mother        multiple myeloma  . Cancer Father         multiple myeloma  . Congestive Heart Failure Brother   . Heart Problems Brother   . Lupus Brother     Social History   Socioeconomic History  . Marital status: Single    Spouse name: Not on file  . Number of children: 0  . Years of education: 34  . Highest education level: Not on file  Occupational History  . Not on file  Tobacco Use  . Smoking status: Former Smoker    Packs/day: 0.25    Types: Cigarettes    Quit date: 11/23/2018    Years since quitting: 1.7  . Smokeless tobacco: Never Used  Vaping Use  . Vaping Use: Never used  Substance and Sexual Activity  . Alcohol use: Not Currently  . Drug use: Yes    Types: Marijuana    Comment: x 1 week  . Sexual activity: Not Currently  Other Topics Concern  . Not on file  Social History Narrative  . Not on file   Social Determinants of Health   Financial Resource Strain:   . Difficulty of Paying Living Expenses: Not on file  Food Insecurity:   . Worried About Charity fundraiser in the Last Year: Not on file  . Ran Out of Food in the Last Year: Not on file  Transportation Needs:   . Lack of Transportation (Medical): Not on file  . Lack of Transportation (Non-Medical): Not on file  Physical Activity:   . Days of Exercise per Week: Not on file  . Minutes of Exercise per Session: Not on file  Stress:   . Feeling of Stress : Not on file  Social Connections:   . Frequency of Communication with Friends and Family: Not on file  . Frequency of Social Gatherings with Friends and Family: Not on file  . Attends Religious Services: Not on file  . Active Member of Clubs or Organizations: Not on file  . Attends Archivist Meetings: Not on file  . Marital Status: Not on file    Review of Systems: A 12 point ROS discussed and pertinent positives are indicated in the HPI above.  All other systems are negative.  Review of Systems  Constitutional: Positive for appetite change, fatigue and unexpected weight change.   Respiratory: Negative for cough and shortness of breath.   Cardiovascular: Negative for chest pain and leg swelling.  Gastrointestinal: Positive for abdominal pain and constipation. Negative for nausea and vomiting.  Musculoskeletal: Negative for back pain.  Neurological: Negative for headaches.    Vital Signs: BP 124/84   Pulse 79   Temp 98 F (36.7 C) (Oral)   Resp 18   Ht '6\' 1"'  (1.854 m)   Wt 157 lb (71.2 kg)   SpO2 100%   BMI 20.71 kg/m   Physical Exam Constitutional:      General: He is not in acute distress. HENT:     Mouth/Throat:     Mouth: Mucous membranes are moist.     Pharynx: Oropharynx  is clear.  Cardiovascular:     Rate and Rhythm: Normal rate and regular rhythm.     Pulses: Normal pulses.     Comments: Visible precordial pulsations  Pulmonary:     Effort: Pulmonary effort is normal.     Breath sounds: Normal breath sounds.  Abdominal:     General: Abdomen is flat. Bowel sounds are normal.     Tenderness: There is abdominal tenderness. There is guarding.  Musculoskeletal:        General: Normal range of motion.     Cervical back: Normal range of motion.  Skin:    General: Skin is warm and dry.  Neurological:     Mental Status: He is alert and oriented to person, place, and time.     Imaging: DG Chest 2 View  Result Date: 07/24/2020 CLINICAL DATA:  Chest pain, shortness of breath EXAM: CHEST - 2 VIEW COMPARISON:  09/17/2019 FINDINGS: Cardiomegaly. No confluent airspace opacities, effusions or edema. No acute bony abnormality. IMPRESSION: Cardiomegaly.  No active disease. Electronically Signed   By: Rolm Baptise M.D.   On: 07/24/2020 10:34   CT ABDOMEN PELVIS W CONTRAST  Result Date: 07/24/2020 CLINICAL DATA:  Acute generalized abdominal pain and distention. EXAM: CT ABDOMEN AND PELVIS WITH CONTRAST TECHNIQUE: Multidetector CT imaging of the abdomen and pelvis was performed using the standard protocol following bolus administration of intravenous  contrast. CONTRAST:  159m OMNIPAQUE IOHEXOL 300 MG/ML  SOLN COMPARISON:  None. FINDINGS: Lower chest: No acute abnormality. Hepatobiliary: No focal liver abnormality is seen. No gallstones, gallbladder wall thickening, or biliary dilatation. Pancreas: There is a large lobulated and heterogeneous mass in the epigastric region which appears to be arising from or involving the pancreatic body and tail, measuring 13.8 by 11.2 cm. This is highly concerning for malignancy. Spleen: The mass described above appears to have medially abut the posterior spleen, and involvement of the spleen cannot be excluded. Adrenals/Urinary Tract: Adrenal glands are unremarkable. Kidneys are normal, without renal calculi, focal lesion, or hydronephrosis. Bladder is unremarkable. Stomach/Bowel: The stomach appears to be significantly displaced anteriorly by the previously described mass. No definite evidence of large or small bowel dilatation is noted. 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 Multiple other lobulated large masses are seen in the mesenteric region concerning for adenopathy, and potentially the previously described mass in left upper quadrant may represent adenopathy. 9.3 x 5.1 cm mass is noted in the inferior pelvis. 9.7 x 4.1 cm mass is seen more superiorly in the pelvis. Probable 7.3 x 5.8 cm mass is noted in the right upper quadrant. Reproductive: Prostate is unremarkable. Other: Mild amount of free fluid is noted in the pelvis. Musculoskeletal: No acute or significant osseous findings. IMPRESSION: Large lobulated and heterogeneous mass is noted in the epigastric region which measures 13.8 x 11.2 cm and is concerning for adenopathy or other malignancy; it appears to be involving the body and  tail of the pancreas, and potentially may be arising from the pancreas. This mass does appear to surround branches of the celiac artery as well as the splenic vein. 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 Multiple other lobulated masses are seen in the mesenteric region concerning for adenopathy or malignancy or possibly lymphoma. Mild amount of free fluid is noted in the pelvis. Electronically Signed   By: Marijo Conception M.D.   On: 07/24/2020 13:16   NM PET Image Initial (PI) Skull Base To Thigh  Result Date: 07/31/2020 CLINICAL DATA:  Initial treatment strategy for adenopathy. EXAM: NUCLEAR MEDICINE PET SKULL BASE TO THIGH TECHNIQUE: 7.9 mCi F-18 FDG was injected intravenously. Full-ring PET imaging was performed from the skull base to thigh after the radiotracer. CT data was obtained and used for attenuation correction and anatomic localization. Fasting blood glucose: 79 mg/dl COMPARISON:  CT AP 07/24/2020 FINDINGS: Mediastinal blood pool activity: SUV max 1.24 Liver activity: SUV max NA NECK: No hypermetabolic lymph nodes in the neck. Incidental CT findings: none CHEST: Mild diffuse tracer uptake noted within both lobes of thyroid gland. Tiny lymph node in the right axilla exhibits mild tracer uptake with SUV max of 4.21. Likely related to right upper extremity radiotracer injection site. No FDG avid mediastinal or hilar adenopathy. Triangular-shaped soft tissue density within the anterior mediastinum exhibits mild tracer uptake with SUV max of 2.51 and likely reflects thymic hyperplasia. Broad-based pleural base nodule overlying the right middle lobe measures 9 mm with SUV max of 4.24. Small scattered lung nodules (approximately 10) are identified, nonspecific these are all too small to reliably  characterize by PET-CT. Example nodule in the superior segment of right lower lobe measures 4 mm, image 63/4. No pleural effusion, airspace consolidation, or atelectasis. Incidental CT findings: Cardiac enlargement. No aortic atherosclerosis or coronary artery calcifications. ABDOMEN/PELVIS: No abnormal FDG uptake identified within the liver, spleen, adrenal glands, or kidneys. The spleen is enlarged with a cranial caudal dimension of 14.8 cm. No focal areas of abnormal uptake identified within the spleen. Multifocal FDG avid soft tissue masses are identified within the abdomen or pelvis. Index lesions include: -Intense FDG uptake is associated with the left upper quadrant mass which appears closely associated with the tail of pancreas. This mass measures 13.8 x 12.5 cm with SUV max of 19.35, image 121/4. -Soft tissue mass within the small bowel mesentery (eccentric to the right) measures 8.9 x 6.2 cm with SUV max of 15.57, image 147/4. -Left lower quadrant small bowel mesenteric mass measures 5.1 x 3.7 cm within SUV max of 14.29, image 159/4. -Soft tissue mass within the right side of pelvis measures 10.4 x 4.6 cm with SUV max of 14.10, image 173/4. -Mass within left pelvis measures 9.2 x 4.6 cm within SUV max of 20.7, image 177/4. -Along the right pericolic gutter there is a soft tissue mass measuring 2.8 x 3.2 cm with SUV max of 17.48, image 157/4. -Mass overlying the right hepatic lobe measures 2.9 x 1.8 cm and has an SUV max of 16.19. Incidental CT findings: none SKELETON: No focal hypermetabolic activity to suggest skeletal metastasis. Incidental CT findings: none IMPRESSION: 1. Extensive, multifocal FDG avid soft tissue masses are identified within the abdomen and pelvis. Suspect lymphoproliferative disorder. Correlation with soft tissue sampling recommended. 2. Additional, FDG avid subpleural nodules overlying the right lung are noted also concerning for sites of lymphoproliferative disorder. 3.  Splenomegaly. 4. Multiple small scattered lung nodules are identified in both lungs. The appearance of these nodules is nonspecific. These are all too small to reliably characterize by PET-CT. Attention on follow-up imaging advised. 5. Cardiac enlargement. Electronically Signed  By: Kerby Moors M.D.   On: 07/31/2020 09:14    Labs:  CBC: Recent Labs    03/06/20 0933 07/22/20 0421 07/24/20 1040 08/15/20 0900  WBC 3.7* 4.5 5.5 5.7  HGB 12.7* 12.2* 13.0 10.9*  HCT 37.5* 37.3* 40.6 34.9*  PLT 157 184 239 215    COAGS: No results for input(s): INR, APTT in the last 8760 hours.  BMP: Recent Labs    09/26/19 0928 09/26/19 0928 01/04/20 0902 01/04/20 0902 03/06/20 0933 07/11/20 0834 07/22/20 0421 07/24/20 1040  NA 137   < > 138   < > 138 139 140 137  K 4.5   < > 4.0   < > 4.4 4.5 3.9 3.8  CL 104   < > 104   < > 105 104 103 101  CO2 32   < > 29   < > '28 26 25 26  ' GLUCOSE 82   < > 64*   < > 87 82 97 97  BUN 22   < > 19   < > '22 16 17 ' 21*  CALCIUM 9.5   < > 9.1   < > 9.5 9.6 9.3 9.3  CREATININE 1.05   < > 1.20   < > 1.26 1.25 0.97 1.36*  GFRNONAA 91  --  77  --   --   --  >60 >60  GFRAA 105  --  90  --   --   --  >60 >60   < > = values in this interval not displayed.    LIVER FUNCTION TESTS: Recent Labs    03/06/20 0933 07/11/20 0834 07/22/20 0421 07/24/20 1040  BILITOT 0.3 0.5 0.5 0.5  AST '19 17 16 23  ' ALT 11 7* 8 16  ALKPHOS  --   --  64 72  PROT 8.0 7.8 7.7 8.0  ALBUMIN  --   --  3.7 3.7    TUMOR MARKERS: No results for input(s): AFPTM, CEA, CA199, CHROMGRNA in the last 8760 hours.  Assessment and Plan:  Pelvic and abdominal lymphadenopathy: Jalin Alicea, 39 year old male, presents today [facility] Interventional Radiology department for an image-guided biopsy of a peritoneal or presacral lesion.   Risks and benefits of this procedure were discussed with the patient including, but not limited to bleeding, infection, damage to adjacent structures or  low yield requiring additional tests.  All of the questions were answered and there is agreement to proceed.  The patient has been NPO. Labs and vitals have been reviewed.   Consent signed and in chart.  Thank you for this interesting consult.  I greatly enjoyed meeting Robertt Buda and look forward to participating in their care.  A copy of this report was sent to the requesting provider on this date.  Electronically Signed: Soyla Dryer, AGACNP-BC [phone] 08/15/2020, 9:37 AM   I spent a total of  30 Minutes   in face to face in clinical consultation, greater than 50% of which was counseling/coordinating care for an image-guided biopsy of a presacral lesion.

## 2020-08-15 NOTE — Progress Notes (Signed)
Discharge instructions reviewed with pt and his father (via telephone) both voice understanding.

## 2020-08-15 NOTE — Discharge Instructions (Signed)

## 2020-08-16 ENCOUNTER — Encounter (HOSPITAL_COMMUNITY): Payer: Self-pay | Admitting: *Deleted

## 2020-08-16 NOTE — Progress Notes (Signed)
FMLA papers faxed to [place]

## 2020-08-19 ENCOUNTER — Other Ambulatory Visit (HOSPITAL_COMMUNITY): Payer: Self-pay

## 2020-08-19 DIAGNOSIS — C8593 Non-Hodgkin lymphoma, unspecified, intra-abdominal lymph nodes: Secondary | ICD-10-CM

## 2020-08-19 LAB — SURGICAL PATHOLOGY

## 2020-08-19 MED ORDER — OXYCODONE HCL 5 MG PO TABS: 10.0000 mg | ORAL_TABLET | Freq: Four times a day (QID) | ORAL | 0 refills | Status: DC | PRN

## 2020-08-19 MED ORDER — OXYCODONE HCL ER 15 MG PO T12A: 30.0000 mg | EXTENDED_RELEASE_TABLET | Freq: Two times a day (BID) | ORAL | 0 refills | Status: DC

## 2020-08-20 ENCOUNTER — Encounter: Payer: Self-pay | Admitting: Family

## 2020-08-20 ENCOUNTER — Other Ambulatory Visit: Payer: Self-pay

## 2020-08-20 ENCOUNTER — Ambulatory Visit (INDEPENDENT_AMBULATORY_CARE_PROVIDER_SITE_OTHER): Payer: Self-pay | Admitting: Family

## 2020-08-20 ENCOUNTER — Other Ambulatory Visit: Payer: Self-pay | Admitting: Family

## 2020-08-20 DIAGNOSIS — B20 Human immunodeficiency virus [HIV] disease: Secondary | ICD-10-CM

## 2020-08-20 DIAGNOSIS — Z0000 Encounter for general adult medical examination without abnormal findings: Secondary | ICD-10-CM

## 2020-08-20 MED ORDER — SULFAMETHOXAZOLE-TRIMETHOPRIM 800-160 MG PO TABS: 1.0000 | ORAL_TABLET | Freq: Every day | ORAL | 5 refills | Status: DC

## 2020-08-20 MED ORDER — BIKTARVY 50-200-25 MG PO TABS: 1.0000 | ORAL_TABLET | Freq: Every day | ORAL | 5 refills | Status: DC

## 2020-08-20 NOTE — Assessment & Plan Note (Signed)
   Covid vaccinations are up-to-date per recommendations.  Discussed/recommended influenza vaccine.  Discussed importance of safe sexual practice to reduce risk of STI.  Condoms provided.  Due for routine dental care with referral placed to [facility].

## 2020-08-20 NOTE — Patient Instructions (Signed)
Nice to see you.  Continue to take your [place] daily.   Restart Bactrim.   If you are needing counseling assistance you can check with the cancer center or you can call behavioral health at [phone]  Plan for follow-up in 4 months or sooner if needed.  Thoughts and prayers for her cancer treatment.  Have a great day and stay safe!

## 2020-08-20 NOTE — Progress Notes (Signed)
Subjective:    Patient ID: Richard Cox, male    DOB: 05/21/1981, 39 y.o.   MRN: [id number]  Chief Complaint  Patient presents with  . Follow-up    B20     HPI:  Lemoine Richard Cox is a 39 y.o. male with HIV/AIDS was last seen in the office on 04/04/2020 with well-controlled HIV and good adherence and tolerance to his ART regimen of Biktarvy.  Bactrim was stopped at that time.  Viral load was undetectable with CD4 count of 153. Most recent blood work completed shows a viral load that was undetectable and CD4 count of 153. Here today for routine follow-up.  [name] continues to take his Biktarvy daily as prescribed with no adverse side effects or missed doses since his last office visit.  He has increased levels of anxiety today as he undergoing evaluation for cancer. Denies fevers, chills, headaches, changes in vision, neck pain/stiffness, nausea, diarrhea, vomiting, lesions or rashes.  He is having abdominal pain then currently on pain medications.  PET scan with multiple lesions within his abdomen.  [name] has no problems obtaining medication from the pharmacy.  He remains optimistic at this point.  Denies any recreational or illicit drug use or alcohol consumption.  Smokes tobacco on occasion.  Covid vaccine is up-to-date per recommendations.  Due for routine dental care.  Condoms provided.   Allergies  Allergen Reactions  . Neosporin [Neomycin-Bacitracin Zn-Polymyx] Rash      Outpatient Medications Prior to Visit  Medication Sig Dispense Refill  . acetaminophen (TYLENOL) 325 MG tablet Take 2 tablets (650 mg total) by mouth every 6 (six) hours as needed for mild pain, fever or headache (or Fever >/= 101). 12 tablet 0  . carvedilol (COREG) 6.25 MG tablet Take 1 tablet (6.25 mg total) by mouth 2 (two) times daily with a meal. For BP and Heart (Patient taking differently: Take 6.25 mg by mouth 2 (two) times daily with a meal. ) 60 tablet 11  . docusate sodium (COLACE) 100 MG capsule Take 100  mg by mouth daily as needed for mild constipation.    . furosemide (LASIX) 40 MG tablet Take 1 tablet (40 mg total) by mouth daily. 30 tablet 3  . hydrALAZINE (APRESOLINE) 50 MG tablet Take 50 mg by mouth 3 (three) times daily.    . isosorbide mononitrate (IMDUR) 30 MG 24 hr tablet Take 30 mg by mouth daily.    Marland Kitchen losartan (COZAAR) 50 MG tablet Take 1 tablet (50 mg total) by mouth daily. 90 tablet 3  . oxyCODONE (OXY IR/ROXICODONE) 5 MG immediate release tablet Take 2 tablets (10 mg total) by mouth every 6 (six) hours as needed for severe pain. 56 tablet 0  . oxyCODONE (OXYCONTIN) 15 mg 12 hr tablet Take 2 tablets (30 mg total) by mouth every 12 (twelve) hours. 30 tablet 0  . oxyCODONE-acetaminophen (PERCOCET) 10-325 MG tablet Take 1 tablet by mouth every 4 (four) hours as needed for pain. 84 tablet 0  . potassium chloride (KLOR-CON) 10 MEQ tablet Take 10 mEq by mouth daily.    . bictegravir-emtricitabine-tenofovir AF (BIKTARVY) 50-200-25 MG TABS tablet Take 1 tablet by mouth daily. 30 tablet 5  . allopurinol (ZYLOPRIM) 300 MG tablet Take 1 tablet (300 mg total) by mouth daily for 20 days. 20 tablet 0  . dicyclomine (BENTYL) 10 MG capsule Take 1 capsule (10 mg total) by mouth 4 (four) times daily -  before meals and at bedtime for 15 days. 60 capsule  0   No facility-administered medications prior to visit.     Past Medical History:  Diagnosis Date  . CHF (congestive heart failure) ([place])   . Essential hypertension   . HIV infection ([place])   . Noncompliance with medication regimen   . 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 NO PAST SURGERIES         Review of Systems  Constitutional: Negative for appetite change, chills, fatigue, fever and unexpected weight change.  Eyes: Negative for visual disturbance.  Respiratory: Negative for cough, chest tightness, shortness of breath and wheezing.   Cardiovascular: Negative for chest pain and  leg swelling.  Gastrointestinal: Positive for abdominal pain. Negative for constipation, diarrhea, nausea and vomiting.  Genitourinary: Negative for dysuria, flank pain, frequency, genital sores, hematuria and urgency.  Skin: Negative for rash.  Allergic/Immunologic: Negative for immunocompromised state.  Neurological: Negative for dizziness and headaches.      Objective:    BP (!) 150/95   Pulse 72   Temp 98.3 F (36.8 C) (Oral)   Wt 154 lb (69.9 kg)   BMI 20.32 kg/m  Nursing note and vital signs reviewed.  Physical Exam Constitutional:      General: He is not in acute distress.    Appearance: He is well-developed.  Eyes:     Conjunctiva/sclera: Conjunctivae normal.  Cardiovascular:     Rate and Rhythm: Normal rate and regular rhythm.     Heart sounds: Normal heart sounds. No murmur heard.  No friction rub. No gallop.   Pulmonary:     Effort: Pulmonary effort is normal. No respiratory distress.     Breath sounds: Normal breath sounds. No wheezing or rales.  Chest:     Chest wall: No tenderness.  Abdominal:     General: Bowel sounds are normal.     Palpations: Abdomen is soft.     Tenderness: There is no abdominal tenderness.  Musculoskeletal:     Cervical back: Neck supple.  Lymphadenopathy:     Cervical: No cervical adenopathy.  Skin:    General: Skin is warm and dry.     Findings: No rash.  Neurological:     Mental Status: He is alert and oriented to person, place, and time.  Psychiatric:        Behavior: Behavior normal.        Thought Content: Thought content normal.        Judgment: Judgment normal.      Depression screen [facility] 2/9 08/20/2020 07/30/2020 10/10/2019  Decreased Interest 0 0 0  Down, Depressed, Hopeless 0 0 1  PHQ - 2 Score 0 0 1       Assessment & Plan:    Patient Active Problem List   Diagnosis Date Noted  . Encounter to establish care 07/30/2020  . Intra-abdominal lymphadenopathy 07/30/2020  . Generalized lymphadenopathy 07/25/2020   . Varicella zoster 01/04/2020  . AIDS (acquired immune deficiency syndrome) ([place]) 10/10/2019  . Healthcare maintenance 10/10/2019  . Chronic combined systolic and diastolic CHF (congestive heart failure) ([place]) 09/18/2019  . HIV (human immunodeficiency virus infection) ([place]) 09/18/2019  . Polysubstance abuse -cocaine use in remission 09/18/2019  . Alcohol abuse 09/18/2019  . Tobacco abuse 09/18/2019  . Nonspecific serologic evidence of human immunodeficiency virus (HIV) 03/13/2019  . Acute exacerbation of CHF (congestive heart failure) ([place]) 03/10/2019  . HTN (hypertension) 03/10/2019  . Congestive heart failure ([place]) 03/10/2019  . SOB (shortness of breath)  03/10/2019  . Noncompliance with medication regimen 03/10/2019     Problem List Items Addressed This Visit      Other   AIDS (acquired immune deficiency syndrome) [facility])    Mr. Beckner continues to have well-controlled HIV/AIDS.  Most recent viral load was undetectable with CD4 count of 153.  No signs/symptoms of opportunistic infection or progressive HIV disease.  Given newly diagnosed malignancy will restart Bactrim for OI prophylaxis with concern for effects on immune system with chemotherapy.  Continue current dose of Biktarvy.  Plan for follow-up in 4 months or sooner if needed with lab work on the same day.      Relevant Medications   bictegravir-emtricitabine-tenofovir AF (BIKTARVY) 50-200-25 MG TABS tablet   sulfamethoxazole-trimethoprim (BACTRIM DS) 800-160 MG tablet   Healthcare maintenance     Covid vaccinations are up-to-date per recommendations.  Discussed/recommended influenza vaccine.  Discussed importance of safe sexual practice to reduce risk of STI.  Condoms provided.  Due for routine dental care with referral placed to [facility].           I am having Rico Ala start on sulfamethoxazole-trimethoprim. I am also having him maintain his acetaminophen, carvedilol, hydrALAZINE, isosorbide mononitrate, potassium  chloride, allopurinol, dicyclomine, losartan, furosemide, docusate sodium, oxyCODONE-acetaminophen, oxyCODONE, oxyCODONE, and Biktarvy.   Meds ordered this encounter  Medications  . bictegravir-emtricitabine-tenofovir AF (BIKTARVY) 50-200-25 MG TABS tablet    Sig: Take 1 tablet by mouth daily.    Dispense:  30 tablet    Refill:  5    Order Specific Question:   Supervising Provider    Answer:   Carlyle Basques [[id number]]  . sulfamethoxazole-trimethoprim (BACTRIM DS) 800-160 MG tablet    Sig: Take 1 tablet by mouth daily.    Dispense:  30 tablet    Refill:  5    Please do not fill until a refill is requested.    Order Specific Question:   Supervising Provider    Answer:   Carlyle Basques [[id number]]     Follow-up: Return in about 4 months (around 12/21/2020), or if symptoms worsen or fail to improve.   Terri Piedra, MSN, FNP-C Nurse Practitioner [facility] [place] number: [phone]

## 2020-08-20 NOTE — Assessment & Plan Note (Signed)
Mr. Razzano continues to have well-controlled HIV/AIDS.  Most recent viral load was undetectable with CD4 count of 153.  No signs/symptoms of opportunistic infection or progressive HIV disease.  Given newly diagnosed malignancy will restart Bactrim for OI prophylaxis with concern for effects on immune system with chemotherapy.  Continue current dose of Biktarvy.  Plan for follow-up in 4 months or sooner if needed with lab work on the same day.

## 2020-08-21 ENCOUNTER — Other Ambulatory Visit (HOSPITAL_COMMUNITY): Payer: Self-pay

## 2020-08-21 ENCOUNTER — Inpatient Hospital Stay (HOSPITAL_COMMUNITY): Payer: 59 | Attending: Hematology | Admitting: Hematology

## 2020-08-21 ENCOUNTER — Encounter (HOSPITAL_COMMUNITY): Payer: Self-pay | Admitting: Hematology

## 2020-08-21 VITALS — BP 161/99 | HR 70 | Temp 97.1°F | Resp 19 | Wt 153.8 lb

## 2020-08-21 DIAGNOSIS — Z5111 Encounter for antineoplastic chemotherapy: Secondary | ICD-10-CM | POA: Diagnosis not present

## 2020-08-21 DIAGNOSIS — R5383 Other fatigue: Secondary | ICD-10-CM | POA: Insufficient documentation

## 2020-08-21 DIAGNOSIS — B20 Human immunodeficiency virus [HIV] disease: Secondary | ICD-10-CM | POA: Insufficient documentation

## 2020-08-21 DIAGNOSIS — Z87891 Personal history of nicotine dependence: Secondary | ICD-10-CM | POA: Diagnosis not present

## 2020-08-21 DIAGNOSIS — E790 Hyperuricemia without signs of inflammatory arthritis and tophaceous disease: Secondary | ICD-10-CM | POA: Diagnosis not present

## 2020-08-21 DIAGNOSIS — C8510 Unspecified B-cell lymphoma, unspecified site: Secondary | ICD-10-CM

## 2020-08-21 DIAGNOSIS — R110 Nausea: Secondary | ICD-10-CM | POA: Insufficient documentation

## 2020-08-21 DIAGNOSIS — I10 Essential (primary) hypertension: Secondary | ICD-10-CM | POA: Insufficient documentation

## 2020-08-21 DIAGNOSIS — R1012 Left upper quadrant pain: Secondary | ICD-10-CM | POA: Insufficient documentation

## 2020-08-21 DIAGNOSIS — Z79899 Other long term (current) drug therapy: Secondary | ICD-10-CM | POA: Insufficient documentation

## 2020-08-21 DIAGNOSIS — C8333 Diffuse large B-cell lymphoma, intra-abdominal lymph nodes: Secondary | ICD-10-CM | POA: Insufficient documentation

## 2020-08-21 MED ORDER — MORPHINE SULFATE ER 30 MG PO TBCR: 30.0000 mg | EXTENDED_RELEASE_TABLET | Freq: Two times a day (BID) | ORAL | 0 refills | Status: DC

## 2020-08-21 NOTE — Patient Instructions (Addendum)
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  You will be treated on Days 1-5 every 21 days with a chemotherapy regimen called R-EPOCH.  The intent of treatment is cure. You will see the doctor regularly throughout treatment.  We will obtain blood work from you prior to every treatment and monitor your results to make sure it is safe to give your treatment. The doctor monitors your response to treatment by the way you are feeling, your blood work, and by obtaining scans periodically.  There will be wait times while you are here for treatment.  It will take about 30 minutes to 1 hour for your lab work to result.  Then there will be wait times while pharmacy mixes your medications.     R-EPOCH is the acronym for the combination of chemotherapy drugs and other drugs you will receive for your treatment.  The drugs this regimen includes are:  Rituxan (rituximab); Cytoxan (cyclophosphamide); etoposide; Oncovin (vincristine); Adriamycin (doxorubicin HCl); and prednisone (not a chemo drug). You will receive Rituxan on Day 1 (Monday) of treatment and then be hooked up to an ambulatory pump (a small pump that you will be able to wear home while your chemotherapy infuses) that will infuse the etoposide, vincristine, and Adriamycin.  The ambulatory pump infusion will take 24 hours.  You will return to the clinic on Days 2, 3, and 4 (Tuesday, Wednesday, Thursday) to have the pump removed and replaced each day.  On Day 5 (Friday) we will remove the pump (it will not be reconnected this time) and give you a Cytoxan infusion.  Once this infusion is complete, we will remove the needle from your port and you will have 2 weeks off prior to starting your next cycle of chemotherapy.    Medications you will receive in the clinic prior to your chemotherapy medications:  Aloxi:  ALOXI is used in adults to help prevent nausea and vomiting that happens with certain  chemotherapy drugs.  Aloxi is a long acting medication, and will remain in your system for about two days.   Emend:  This is an anti-nausea medication that is used with Aloxi to help prevent nausea and vomiting caused by chemotherapy.  Dexamethasone:  This is a steroid given prior to chemotherapy to help prevent allergic reactions; it may also help prevent and control nausea and diarrhea.   Tylenol:  Given to prevent infusion reactions to rituximab.  Benadryl:  Antihistamine given to prevent allergic/infusion reactions to rituximab.    Doxorubicin (Generic Name) Other Names: Adriamycin, hydroxyl daunorubicin  About This Drug  Doxorubicin is a drug used to treat cancer. This drug is given in the vein (IV) through your port a cath.  This medication will be given to you via an ambulatory pump (a pump that you can wear home while your chemotherapy infuses).  Possible Side Effects (More Common)  . Bone marrow depression. This is a decrease in the number of white blood cells, red blood cells, and platelets. This may raise your risk of infection, make you tired and weak (fatigue), and raise your risk of bleeding.  . Hair loss: Hair loss is often complete scalp hair loss and can involve loss of eyebrows, eyelashes, and pubic hair. You may notice this a few days or weeks after treatment has started. Most often hair loss is temporary; your hair should grow back when treatment is done.  . Nausea and throwing up (vomiting). These symptoms may  happen within a few hours after your treatment and may last up to 24 hours. Medicines are available to stop or lessen these side effects.  . Soreness of the mouth and throat. You may have red areas, white patches, or sores that hurt.  . Change in the color of your urine to pink or red. This color change will go away in one to two days.  . Effects on the heart: This drug can weaken the heart and lower heart function. Your heart function will be checked as  needed. You may have trouble catching your breath, mainly during activities.  You may also have trouble breathing while lying down, and have swelling in your ankles.  . Sensitivity to light (photosensitivity). Photosensitivity means that you may become more sensitive to the effects of the sun, sun lamps, and tanning beds. Your eyes may water more, mostly in bright light.  . Metallic taste in the mouth: This may change the taste of food and drinks  . Decreased appetite (decreased hunger)  . Darkening of the skin or nails  . Weakness that interferes with your daily activities   Possible Side Effects (Less Common)  . Skin and tissue irritation may involve redness, pain, warmth, or swelling at the IV site. This happens if the drug leaks out of the vein and into nearby tissue.  . Changes in your liver function. Your doctor will check your liver function as needed.  . This drug may cause an increased risk of developing a second cancer  Allergic Reaction  Serious allergic reactions, including anaphylaxis are rare. While you are getting this drug in your vein (IV), tell your nurse right away if you have any of these symptoms of an allergic reaction:  . Trouble catching your breath  . Feeling like your tongue or throat are swelling  . Feeling your heart beat quickly or in a not normal way (palpitations)  . Feeling dizzy or lightheaded  . Flushing, itching, rash, and/or hives   Treating Side Effects  . Drink 6-8 cups of fluids every day unless your doctor has told you to limit your fluid intake due to some other health problem. A cup is 8 ounces of fluid. If you vomit or have diarrhea, you should drink more fluids so that you do not become dehydrated (lack water in the body due to losing too much fluid).  . Ask your doctor or nurse about medicine that is available to help stop or lessen nausea, throwing up, and/or loose bowel movements  . Wear dark sunglasses and use sunscreen with  SPF 30 or higher when you are outdoors even for a short time. Cover up when you are out in the sun. Wear wide-brimmed hats, long-sleeved shirts, and pants. Keep your neck, chest, and back covered.  . Mouth care is very important. Your mouth care should consist of routine, gentle cleaning of your teeth or dentures and rinsing your mouth with a mixture of 1/2 teaspoon of salt in 8 ounces of water or  teaspoon of baking soda in 8 ounces of water. This should be done at least after each meal and at bedtime.  . If you have mouth sores, avoid mouthwash that has alcohol. Avoid alcohol and smoking because they can bother your mouth and throat.  . Talk with your nurse about getting a wig before you lose your hair. Also, call the [place] at 800-ACS-2345 to find out information about the "Look Good, Feel Better" program close to where you  live. It is a free program where women getting chemotherapy can learn about wigs, turbans and scarves as well as makeup techniques and skin and nail care.  . While you are getting this drug, please tell your nurse right away if you have any pain, redness, or swelling at the site of the IV infusion.   Food and Drug Interactions  There are no known interactions of doxorubicin with food. This drug may interact with other medicines. Tell your doctor and pharmacist about all the medicines and dietary supplements (vitamins, minerals, herbs and others) that you are taking at this time. The safety and use of dietary supplements and alternative diets are often not known. Using these might affect your cancer or interfere with your treatment. Until more is known, you should not use dietary supplements or alternative diets without your cancer doctor's help.   When to Call the Doctor  Call your doctor or nurse right away if you have any of these symptoms:  . Fever of 100.4 F (38 C) or above  . Chills  . Easy bruising or bleeding  . Wheezing or trouble  breathing  . Rash or itching  . Feeling dizzy or lightheaded  . Feeling that your heart is beating in a fast or not normal way (palpitations)  . Loose bowel movements (diarrhea) more than 4 times a day or diarrhea with weakness or feeling lightheaded  . Nausea that stops you from eating or drinking  . Throwing up/vomiting  . Signs of liver problems: dark urine, pale bowel movements, bad stomach pain, feeling very tired and weak, unusual itching, or yellowing of the eyes or skin,  . During the IV infusion, if you have pain, redness, or swelling at the site of the IV infusion, please tell your nurse right away   Call your doctor or nurse as soon as possible if any of these symptoms happen:  . Decreased urine  . Pain in your mouth or throat that makes it hard to eat or drink  . Nausea and throwing up that is not relieved by prescribed medicines  . Rash that is not relieved by prescribed medicines  . Swelling of legs, ankles, or feet  . Weight gain of 5 pounds in one week (fluid retention)  . Lasting loss of appetite or rapid weight loss of five pounds in a week  . Fatigue that interferes with your daily activities  . Extreme weakness that interferes with normal activities   Sexual Problems and Reproduction Concerns  . Infertility warning: Sexual problems and reproduction concerns may happen. In both men and women, this drug may affect your ability to have children. This cannot be determined before your treatment. Talk with your doctor or nurse if you plan to have children. Ask for information on sperm or egg banking.  . In men, this drug may interfere with your ability to make sperm, but it should not change your ability to have sexual relations.  . In women, menstrual bleeding may become irregular or stop while you are getting this drug. Do not assume that you cannot become pregnant if you do not have a menstrual period.  . Women may go through signs of menopause  (change of life) like vaginal dryness or itching. Vaginal lubricants can be used to lessen vaginal dryness, itching, and pain during sexual relations.  . Genetic counseling is available for you to talk about the effects of this drug therapy on future pregnancies. Also, a Dietitian can look at the  possible risk of problems in the unborn baby due to this medicine if an exposure happens during pregnancy.  . Pregnancy warning: This drug may have harmful effects on the unborn baby, so effective methods of birth control should be used by you and your partner during your cancer treatment and for at least 6 months after treatment  . Breast feeding warning: Women should not breast feed during treatment because this drug could enter the breast milk and badly harm a breast feeding baby.   Etoposide  About This Drug  Etoposide is used to treat cancer. It is given in the vein (IV) through your port a cath. This medication will be given to you via an ambulatory pump (a pump that you can wear home while your chemotherapy infuses).  Possible Side Effects  . Bone marrow suppression. This is a decrease in the number of white blood cells, red blood cells, and platelets. This may raise your risk of infection, make you tired and weak (fatigue), and raise your risk of bleeding.  . Nausea and vomiting (throwing up)  . Hair loss. Hair loss is often temporary, although with certain medicine, hair loss can sometimes be permanent. Hair loss may happen suddenly or gradually. If you lose hair, you may lose it from your head, face, armpits, pubic area, chest, and/or legs. You may also notice your hair getting thin.  Note: Each of the side effects above was reported in 20% or greater of patients treated with etoposide. Not all possible side effects are included above.   Warnings and Precautions  . Severe bone marrow suppression, which may be life-threatening  . This drug may raise your risk of getting a  second cancer, such as leukemia  . Low blood pressure with rapid infusion of the medication  . Allergic reactions, including anaphylaxis are rare but may happen in some patients. Signs of allergic reaction to this drug may be swelling of the face, feeling like your tongue or throat are swelling, trouble breathing, rash, itching, fever, chills, feeling dizzy, and/or feeling that your heart is beating in a fast or not normal way. If this happens, do not take another dose of this drug. You should get urgent medical treatment.  . These side effects may be more severe if you are receiving high doses of this medication included in pre-transplant chemotherapy.   Treating Side Effects  . Manage tiredness by pacing your activities for the day.  . Be sure to include periods of rest between energy-draining activities.  . To decrease the risk of infection, wash your hands regularly.  . Avoid close contact with people who have a cold, the flu, or other infections.  . Take your temperature as your doctor or nurse tells you, and whenever you feel like you may have a fever.  . To help decrease bleeding, use a soft toothbrush. Check with your nurse before using dental floss.  . Be very careful when using knives or tools.  . Use an electric shaver instead of a razor.  . Drink plenty of fluids (a minimum of eight glasses per day is recommended).  . If you throw up or have loose bowel movements, you should drink more fluids so that you do not become dehydrated (lack of water in the body from losing too much fluid).  . To help with nausea and vomiting, eat small, frequent meals instead of three large meals a day. Choose foods and drinks that are at room temperature. Ask your nurse or doctor  about other helpful tips and medicine that is available to help or stop lessen these symptoms.  . To help with hair loss, wash with a mild shampoo and avoid washing your hair every day.  . Avoid rubbing your scalp,  pat your hair or scalp dry.  . Avoid coloring your hair.  . Limit your use of hair spray, electric curlers, blow dryers, and curling irons.  . If you are interested in getting a wig, talk to your nurse. You can also call the [place] at 800-ACS-2345 to find out information about the "Look Good, Feel Better" program close to where you live. It is a free program where women getting chemotherapy can learn about wigs, turbans and scarves as well as makeup techniques and skin and nail care.   Food and Drug Interactions  . There are no known interactions of etoposide with food.  . This drug may interact with other medicines. Tell your doctor and pharmacist about all the medicines and dietary supplements (vitamins, minerals, herbs and others) that you are taking at this time. The safety and use of dietary supplements and alternative diets are often not known. Using these might affect your cancer or interfere with your treatment. Until more is known, you should not use dietary supplements or alternative diets without your cancer doctor's help.  . There are known interactions of etoposide with blood thinning medicine such as warfarin. Ask your doctor what precautions you should take.   When to Call the Doctor  Call your doctor or nurse if you have any of these symptoms and/or any new or unusual symptoms:  . Fever of 100.4 F (38 C) or higher  . Chills  . Tiredness that interferes with your daily activities  . Feeling dizzy or lightheaded  . Feeling that your heart is beating in a fast or not normal way (palpitations)  . Easy bleeding or bruising  . Nausea that stops you from eating or drinking and/or is not relieved by prescribed medicines  . Throwing up more than 3 times a day  . Signs of allergic reaction: swelling of the face, feeling like your tongue or throat are swelling, trouble breathing, rash, itching, fever, chills, feeling dizzy, and/or feeling that your heart  is beating in a fast or not normal way  . If you think you may be pregnant or may have impregnated your partner   Reproduction Warnings  . Pregnancy warning: This drug can have harmful effects on the unborn baby. Women of childbearing potential should use effective methods of birth control during your cancer treatment and for at least 6 months after treatment. Men with male partners of childbearing potential should use effective methods of birth control during your cancer treatment and for at least 4 months after your cancer treatment. Let your doctor know right away if you think you may be pregnant or may have impregnated your partner.  . Breastfeeding warning: It is not known if this drug passes into breast milk. For this reason, women should talk to their doctor about the risks and benefits of breastfeeding during treatment with this drug because this drug may enter the breast milk and cause harm to a breastfeeding baby.  . Fertility warning: In men and women both, this drug may affect your ability to have children in the future. Talk with your doctor or nurse if you plan to have children. Ask for information on sperm or egg banking.   Vincristine sulfate (Generic Name) Other Names: Oncovin, Vincasar,  VCR  About This Drug  Vincristine is a drug used to treat cancer. It is given in the vein (IV) through your port a cath. This medication will be given to you via an ambulatory pump (a pump that you can wear home while your chemotherapy infuses).   Possible Side Effects (More Common)  . Constipation (not able to move bowels)  . Hair loss. You may notice hair getting thin. Some patients lose their hair. Your hair often grows back when treatment is done. Most often hair loss is temporary; your hair should grow back when treatment is done.  . Effects on the nerves are called peripheral neuropathy. You may feel numbness, tingling, or pain in your hands and feet. It may be hard for you to  button your clothes, open jars, or walk as usual. The effect on the nerves may get worse with more doses of the drug. These effects get better in some people after the drug is stopped but it does not get better in all people.   Possible Side Effects (Less Common)  . Soreness of the mouth and throat. You may have red areas, white patches, or sores that hurt.  . Swelling of your legs, ankles, and/or feet  . Skin and tissue irritation may involve redness, pain, warmth, or swelling at the IV site. This happens if the drug leaks out of the vein and into nearby tissue.  . Blood pressure changes. Some patients have low blood pressure and some patients have high blood pressure.  . Jaw pain  . Hoarseness  . Blurred or double vision or other changes in eyesight may happen but are a rare side effect.  . Feeling dizzy or lightheaded  . Drooping eyelids are a rare side effect.  . Depression  . Rash  . Bone marrow depression. This is a decrease in the number of white blood cells, red blood cells, and platelets. This may raise your risk of infection, make you tired and weak (fatigue), and raise your risk of bleeding.  . Chest pain or symptoms of a heart attack. Most heart attacks involve pain in the center of the chest that lasts more than a few minutes. The pain may go away and come back or it can be constant. It can feel like pressure, squeezing, fullness, or pain. Sometimes pain is felt in one or both arms, the back, neck, jaw, or stomach. If any of these symptoms last 2 minutes, call 911.  . Trouble sleeping  . Lack of appetite; weight loss   Allergic Reactions  Serious allergic reactions including anaphylaxis are rare. While you are getting this drug in your vein (IV), tell your nurse right away if you have any of these symptoms of an allergic reaction:  . Trouble catching your breath  . Feeling like your tongue or throat are swelling  . Feeling your heart beat quickly or in a not  normal way (palpitations)  . Feeling dizzy or lightheaded  . Flushing, itching, rash, and/or hives   Treating Side Effects  . While you are getting this drug in your IV, please tell your nurse right away if you have any pain, redness, or swelling at the site of the IV infusion.  . Ask your doctor or nurse how to prevent or lessen constipation. Constipation can become a serious side effect. If you are constipated, check with your doctor or nurse before you use enemas, laxatives, or suppositories.  . Drink 6-8 cups of fluids each day unless your  doctor has told you to limit your fluid intake due to some other health problem. A cup is 8 ounces of fluid. If you throw up or have loose bowel movements, you should drink more fluids so that you do not become dehydrated (lack water in the body from losing too much fluid).  . Mouth care is very important. Your mouth care should consist of gently cleaning your teeth or dentures and rinsing your mouth with a mixture of  teaspoon salt in 8 ounces of water or  teaspoon sodium bicarbonate (baking soda) in 8 ounces of water. This should be done at least after each meal and at bedtime.  . If you have mouth sores, avoid mouthwash that has alcohol. Also avoid alcohol and smoking because they can bother your mouth and throat.  . If you get a rash do not put anything on it unless your doctor or nurse says you may. Keep the area around the rash clean and dry. Ask your doctor for medicine if your rash bothers you.  . If you have numbness and tingling in your hands and feet, be careful when cooking, walking, and handling sharp objects and hot liquids.  . Talk with your nurse about getting a wig before you lose your hair. Also, call the [place] at 800-ACS-2345 to find out information about the "Look Good, Feel Better" program close to where you live. It is a free program where women getting chemotherapy can learn about wigs, turbans and scarves as  well as makeup techniques and skin and nail care.   Food and Drug Interactions  . There are known interactions of Vincristine with grapefruit. Do not eat grapefruit or drink grapefruit juice while taking this drug.  . Check with your doctor or pharmacist about all other prescription medicines and dietary supplements you are taking before starting this medicine as there are a lot of known drug interactions with Vincristine. Also, check with your doctor or pharmacist before starting any new prescription, over-the-counter medicines, or dietary supplement to make sure that there are no interactions.   When to Call the Doctor  Call your doctor or nurse right away if you have any of these symptoms:  . Redness, pain, warmth, or swelling at the IV site while you are getting this drug  . No bowel movement in 3 days or when you feel uncomfortable.  . Abdominal pain  . Fever of 100.4 F (38 C) or higher  . Chills  . Feeling short of breath  . Easy bleeding or bruising  . Jaw pain  . Hoarseness  . Drooping eyelids  . Blurred vision or other changes in eyesight  . Feeling confused, restless, or irritable  . Seizures (Common symptoms of a seizure can include confusion, blacking out, passing out, loss of hearing or vision, blurred vision, unusual smells or tastes (such as burning rubber), trouble talking, tremors or shaking in parts or all of the body, repeated body movements, tense muscles that do not relax, and loss of control of urine and bowels. There are other less common symptoms of seizures. If you or your family member suspects you are having a seizure, call 911 right away).  . Hallucinations (seeing, hearing, or feeling things that are not really there)  . Feeling  . Rash  . Chest pain or symptoms of a heart attack. Most heart attacks involve pain in the center of the chest that lasts more than a few minutes. The pain may go away and come  back. It can feel like pressure,  squeezing, fullness, or pain. Sometimes pain is felt in one or both arms, the back, neck, jaw, or stomach. If any of these symptoms last 2 minutes, call 911.  Call your doctor or nurse as soon as possible if you have any of these symptoms:  . Pain in fingers, toes, joints, or testicles  . Numbness, tingling, or decreased feeling or weakness in fingers, toes, arms, or legs  . Trouble walking or changes in the way you walk  . Swelling of your legs, ankles, and/or feet  . Headache  . Pain in your mouth or throat that makes it hard to eat or drink  . Loss of appetite or weight loss  . Hearing changes  . Depression  . Trouble sleeping   Reproduction Concerns  . Women may go through signs of menopause (change of life) like vaginal dryness or itching. Vaginal lubricants can be used to lessen vaginal dryness, itching, and pain during sexual relations.  . Pregnancy warning: This drug may have harmful effects on the unborn child, so effective methods of birth control should be used during your cancer treatment.  . Breast feeding warning: It is not known if this drug passes into breast milk. For this reason, women should talk to their doctor about the risks and benefits of breast feeding during treatment with this drug because this drug may enter the breast milk and badly harm a breast feeding baby.   Cyclophosphamide (Generic Name) Other Names: Cytoxan, Neosar  About This Drug  Cyclophosphamide is a drug used to treat cancer. It is given in the vein (IV) through your port a cath. This drug takes 30 minutes to infuse.    Possible Side Effects (More Common)  . Nausea and throwing up (vomiting). These symptoms may happen within a few hours after your treatment and may last up to 72 hours. Medicines are available to stop or lessen these side effects.  . Bone marrow depression. This is a decrease in the number of white blood cells, red blood cells, and platelets. This may raise your  risk of infection, make you tired and weak (fatigue), and raise your risk of bleeding.  . Hair loss: You may notice hair getting thin. Some patients lose their hair. Hair loss is often complete scalp hair loss and can involve loss of eyebrows, eyelashes, and pubic hair. You may notice this a few days or weeks after treatment has started. Most often hair loss is temporary; your hair should grow back when treatment is done.  . Decreased appetite (decreased hunger)  . Blurred vision  . Soreness of the mouth and throat. You may have red areas, white patches, or sores that hurt.  . Effects on the bladder. This drug may cause irritation and bleeding in the bladder. You may have blood in your urine. To help stop this, you will get extra fluids to help you pass more urine. You may get a drug called mesna, which helps to decrease irritation and bleeding. You may also get a medicine to help you pass more urine. You may have a catheter (tube) placed in your bladder so that your bladder will be washed with this drug.  Possible Side Effects (Less Common)  . Darkening of the skin or nails  . Metallic taste in the mouth  . Changes in lung tissue may happen with large amounts of this drug. These changes may not last forever, and your lung tissue may go back to normal.  Sometimes these changes may not be seen for many years. You may get a cough or have trouble catching your breath.   Allergic Reactions  Serious allergic reactions including anaphylaxis are rare. While you are getting this drug in your vein (IV), tell your nurse right away if you have any of these symptoms of an allergic reaction:  . Trouble catching your breath  . Feeling like your tongue or throat are swelling  . Feeling your heart beat quickly or in a not normal way (palpitations)  . Feeling dizzy or lightheaded  . Flushing, itching, rash, and/or hives   Treating Side Effects  . Drink 6-8 cups of fluids each day unless your  doctor has told you to limit your fluid intake due to some other health problem. A cup is 8 ounces of fluid. If you throw up or have loose bowel movements you should drink more fluids so that you do not become dehydrated (lack water in the body due to losing too much fluid).  . Ask your doctor or nurse about medicine that is available to help stop or lessen nausea or throwing up.  . Mouth care is very important. Your mouth care should consist of routine, gentle cleaning of your teeth or dentures and rinsing your mouth with a mixture of 1/2 teaspoon of salt in 8 ounces of water or  teaspoon of baking soda in 8 ounces of water. This should be done at least after each meal and at bedtime.  . If you have mouth sores, avoid mouthwash that has alcohol. Also avoid alcohol and smoking because they can bother your mouth and throat.  . Talk with your nurse about getting a wig before you lose your hair. Also, call the [facility] at 800-ACS-2345 to find out information about the " Look Good.Marland KitchenMarland KitchenFeel Better" program close to where you live. It is a free program where women undergoing chemotherapy learn about wigs, turbans and scarves as well as makeup techniques and skin and nail care.  Important Information  . Whenever you tell a doctor or nurse your health history, always tell them that you have received cyclophosphamide in the past.  . If you take this drug by mouth swallow the medicine whole. Do not chew, break or crush it.  . You can take the medicine with or without food. If you have nausea, take it with food. Do not take the pills at bedtime.  Food and Drug Interactions  There are no known interactions of cyclophosphamide with food. This drug may interact with other medicines. Tell your doctor and pharmacist about all the medicines and dietary supplements (vitamins, minerals, herbs and others) that you are taking at this time. The safety and use of dietary supplements and alternative diets  are often not known. Using these might affect your cancer or interfere with your treatment. Until more is known, you should not use dietary supplements or alternative diets without your cancer doctor's help.   When to Call the Doctor  Call your doctor or nurse right away if you have any of these symptoms:  . Fever of 100.4 F (38 C) or higher  . Chills  . Bleeding or bruising that is not normal  . Blurred vision or other changes in eyesight  . Pain when passing urine; blood in urine  . Pain in your lower back or side  . Wheezing or trouble breathing  . Swelling of legs, ankles, or feet  . Feeling dizzy or lightheaded  . Feeling  confused or agitated  . Signs of liver problems: dark urine, pale bowel movements, bad stomach pain, feeling very tired and weak, unusual itching, or yellowing of the eyes or skin  . Unusual thirst or passing urine often  . Nausea that stops you from eating or drinking  . Throwing up/vomiting  Call your doctor or nurse as soon as possible if any of these symptoms happen:  . Pain in your mouth or throat that makes it hard to eat or drink  . Nausea not relieved by prescribed medicines   Sexual Problems and Reproductive Concerns  . Infertility warning: Sexual problems and reproduction concerns may happen. In both men and women, this drug may affect your ability to have children. This cannot be determined before your treatment. Talk with your doctor or nurse if you plan to have children. Ask for information on sperm or egg banking.  . In men, this drug may interfere with your ability to make sperm, but it should not change your ability to have sexual relations.  . In women, menstrual bleeding may become irregular or stop while you are getting this drug. Do not assume that you cannot become pregnant if you do not have a menstrual period.  . Women may go through signs of menopause (change of life) like vaginal dryness or itching. Vaginal lubricants  can be used to lessen vaginal dryness, itching, and pain during sexual relations.  . Genetic counseling is available for you to talk about the effects of this drug therapy on future pregnancies. Also, a genetic counselor can look at the possible risk of problems in the unborn baby due to this medicine if an exposure happens during pregnancy.  . Pregnancy warning: This drug may have harmful effects on the unborn child, so effective methods of birth control should be used during your cancer treatment.  . Breast feeding warning: Women should not breast feed during treatment because this drug could enter the breast milk and badly harm a breast feeding baby.   Rituximab (Generic Name) Other Name: Rituxan  About This Drug  Rituximab is a monoclonal antibody used to treat cancer. This drug is given in the vein (IV).  This first time this is given it will be infused slower to monitor for infusion reactions.    Possible Side Effects (More Common)  . Bone marrow depression. This is a decrease in the number of white blood cells, red blood cells, and platelets. This  may raise your risk of infection, make you tired and weak (fatigue), and raise your risk of bleeding.  . Rash-skin irritation, redness or itching (dermatitis)  . Flu-like symptoms: fever, headache, muscle and joint aches, and fatigue (low energy, feeling weak)  . Infusion-related reactions  . Hepatitis B - if you have ever had hepatitis B, the virus may come back during treatment with this drug. Your doctor will test to see if you have ever had hepatitis B prior to your treatment.  . Changes in your central nervous system can happen. The central nervous system is made up of your brain and spinal cord. You could feel: extreme tiredness, agitation, confusion, or have: hallucinations (see or hear things that are not there), trouble understanding or speaking, loss of control of your bowels or bladder, eyesight changes, numbness or lack of  strength to your arms, legs, face, or body, seizures or coma. If you start to have any of these symptoms let your doctor know right away.  . Tumor lysis: This drug may act  on the cancer cells very quickly. This may affect how your kidneys work. Your doctor will monitor your kidney function.  . Changes in your liver function. Your doctor will check your liver function as needed.  . Nausea and throwing up (vomiting): these symptoms may happen within a few hours after your treatment and may last up to 24 hours. Medicines are available to stop or lessen these side effects.  . Loose bowel movements (diarrhea) that may last for a few days  . Abdominal pain  . Infections  . Cough, runny nose  . Swelling of your legs, ankles and/or feet or hands  . High blood pressure. Your doctor will check your blood pressure as needed.  . Abnormal heart beat  Possible Side Effects (Less Common)  . Shortness of breath  . Soreness of the mouth and throat. You may have red areas, white patches, or sores that hurt.  Infusion Reactions  Infusion Reactions are the most common side effect linked to use of this drug and can be quite severe. Medicines will be given before you get the drug to lower the severity of this side effect. The infusion reactions are the worse with the first dose of the drug and become less severe with more doses of the drug. While you are getting this drug in your vein (IV), tell your nurse right away if you have any of these symptoms of an allergic reaction:  . Trouble catching your breath  . Feeling like your tongue or throat are swelling  . Feeling your heart beat quickly or in a not normal way (palpitations)  . Feeling dizzy or lightheaded  . Flushing, itching, rash, and/or hives   Treating Side Effects  . Ask your doctor or nurse about medicine to stop or lessen headache, loose bowel movements (diarrhea), constipation, nausea, throwing up (vomiting), or pain.  . If you  get a rash do not put anything it unless your doctor or nurse says you may. Keep the area around the rash clean and dry. Ask your doctor for medicine if the rash bothers you.  . Drink 6-8 cups of fluids each day unless your doctor has told you to limit your fluid intake due to some other health problem. A cup is 8 ounces of fluid. If you throw up or have loose bowel movements, you should drink more fluids so that you do not become dehydrated (lack of water in the body from losing too much fluid).  . If you are not able to move your bowels, check with your doctor or nurse before you use enemas, laxatives, or suppositories  . If you have mouth sores, avoid mouthwash that has alcohol. Also avoid alcohol and smoking because they can bother your mouth and throat.  . If you have a nose bleed, sit with your head tipped slightly forward. Apply pressure by lightly pinching the bridge of your nose between your thumb and forefinger. Call your doctor if you feel dizzy or faint or if the bleeding doesn't stop after 10 to 15 minutes   Important Information  . After treatment with this drug, vaccination with live viruses should be delayed until the immune system recovers.  . Symptoms of abnormal bleeding may be: coughing up blood, throwing up blood (may look like coffee grounds), red or black, tarry bowel movements, blood in urine, abnormally heavy menstrual flow, nosebleeds, or any unusual bleeding.  . Symptoms of high blood pressure may be: headache, blurred vision, confusion, chest pain, or a feeling  that your heart is beat differently.  Marland Kitchen Urinary tract infection. Symptoms may include:  . Pain or burning when you pass urine  . Feeling like you have to pass urine often, but not much comes out when you do.  . Tender or heavy feeling in your lower abdomen  . Cloudy urine and/or urine that smells bad.  . Pain on one side of your back under your ribs. This is where your kidneys are.  . Fever, chills,  nausea and/or throwing up   Food and Drug Interactions  There are no known interactions of rituximab and any food. This drug may interact with other medicines. Tell your doctor and pharmacist about all the medicines and dietary supplements (vitamins, minerals, herbs and others) that you are taking at this time. The safety and use of dietary supplements and alternative diets are often not known. Using these might affect your cancer or interfere with your treatment. Until more is known, you should not use dietary supplements or alternative diets without your cancer doctor's help.   When to Call the Doctor  Call your doctor or nurse right away if you have any of these symptoms:  . Fever of 100.4 F (38 C) higher  . Chills  . Trouble breathing  . Rash with or without itching  . Blistering or peeling of skin  . Chest pain or symptoms of a heart attack. Most heart attacks involve pain in the center of the chest that lasts more than a few minutes. The pain may go away and come back or it can be constant. It can feel like pressure, squeezing, fullness, or pain. Sometimes pain is felt in one or both arms, the back, neck, jaw, or stomach. If any of these symptoms last 2 minutes, call 911  . Easy bleeding or bruising  . Blood in urine or bowel movements  . Feeling that your heart is beating in a fast or not normal way (palpitations)  . Nausea that stops you from eating or drinking  . Throwing up/vomiting  . Abdominal pain  . Loose bowel movements (diarrhea) 4 times in one day or diarrhea with weakness or lightheadedness  . No bowel movement in 3 days or if you feel uncomfortable  . Feeling dizzy or lightheaded  . Changes in your speech or vision  . Feeling confused  . Weakness of your arms and legs or poor coordination (feeling clumsy)  . Signs of liver problems: dark urine, pale bowel movements, bad stomach pain, feeling very tired and weak, unusual itching, or yellowing of skin  or eyes  . Symptoms of a urinary tract infection (see important information)   Call your doctor or nurse as soon as possible if you have any of these symptoms:  . Swelling of your legs, ankles and/or feet  . Fatigue and /or weakness that interferes with your daily activities  . Joint and muscle pain or muscle spasms that are not relieved by prescribed medicines  . Cough that lasts longer than normal    Reproduction Concerns  . Pregnancy warning: This drug is known to cross the placenta. This drug may have harmful effects on an unborn baby. Effective methods of birth control should be used during treatment with this drug and for 12 months after the last treatment. If exposure occurs to an unborn baby, the baby's immune system may be affected, which could last for months after birth. Until the immune system recovers, live vaccines should not be administered to the baby. Be  sure to talk with your doctor if you are pregnant or planning to become pregnant while getting this drug.  . Breast feeding warning: It is not known if rituximab is passed into human breast milk. In animal studies, this drug was detected in in breast milk. For this reason, women should talk to their doctor about the risks and benefits of breast feeding during treatment with this drug because this drug may enter the breast milk and badly harm a breast feeding baby.  Prednisone  About This Drug  Prednisone is a steroid that may be used to treat cancer. It is given orally (by mouth).  Possible Side Effects  . Abnormal heart beat  . Headache  . High blood pressure  . Increased sweating  . Blurred vision or other changes in eyesight  . Tiredness and weakness  . Changes in mood, which may include depression or a feeling of extreme well-being  . Trouble sleeping  . Feeling restless (unable to relax)  . Skin changes such as rash, dryness, or redness  . Increased appetite (increased hunger)  . Weight  gain  . Electrolyte changes  . Increased risk of infections  . Aggravation of stomach ulcers  . Pain in your abdomen  . Nausea  . Blood sugar levels may change  . Swelling of your legs, ankles and/or feet  . Muscle loss and/or weakness (lack of muscle strength)  . Increased risk of developing osteoporosis- your bones may become weak and brittle  . Increased risk for cataracts, glaucoma or infections of the eye  . Changes in your liver function  Note: Not all possible side effects are included above.  Warnings and Precautions  . High blood pressure and changes in electrolytes, which can cause fluid build-up around your heart, lungs or elsewhere.  . Severe infections, which can be life-threatening.  . Allergic reactions, including anaphylaxis are rare but may happen in some patients. Signs of allergic reaction to this drug may be swelling of the face, feeling like your tongue or throat are swelling, trouble breathing, rash, itching, fever, chills, feeling dizzy, and/or feeling that your heart is beating in a fast or not normal way. If this happens, do not take another dose of this drug. You should get urgent medical treatment.  . Increased risk of developing a hole in your stomach, small, and/or large intestine if you have ulcers in the lining of your stomach and/or intestine, or have diverticulitis, ulcerative colitis and/or other diseases that affect the gastrointestinal tract.  . Blurred vision or other changes in eyesight.  . Severe depression and other psychiatric disorders such as mood changes.  . Effects on the endocrine glands including pituitary, adrenals and thyroid during or after use of this medication.  Note: Some of the side effects above are very rare. If you have concerns and/or questions, please discuss them with your medical team.  Important Information  . Talk to your doctor or your nurse before stopping this medication, it should be stopped gradually.  Depending on the dose and length of treatment, you could experience serious side effects if stopped abruptly (suddenly).  . Talk to your doctor before receiving any vaccinations during your treatment. Some vaccinations are not recommended while receiving prednisone.  How to Take Your Medication  . For Oral (by mouth): You can take the medicine with or without food, but it is preferable to be taken with food, especially If you have nausea or upset stomach.  . Missed dose: If you miss  or vomit a dose, contact your physician. Do not take 2 doses at the same time and do not double up on the next dose.  Marland Kitchen Handling: Wash your hands after handling your medicine, your caretakers should not handle your medicine with bare hands and should wear latex gloves.  . Storage: Store this medicine in the original container at room temperature, protect from moisture. Discuss with your nurse or your doctor how to dispose of unused medicine.   Treating Side Effects  . Drink plenty of fluids (a minimum of eight glasses per day is recommended).  . To help with nausea and vomiting, eat small, frequent meals instead of three large meals a day. Choose foods and drinks that are at room temperature. Ask your nurse or doctor about other helpful tips and medicine that is available to help stop or lessen these symptoms.  . If you throw up, you should drink more fluids so that you do not become dehydrated (lack of water in the body from losing too much fluid).  . Manage tiredness by pacing your activities for the day. Be sure to include periods of rest between energy-draining activities.  . To help with muscle weakness, get regular exercise. If you feel too tired to exercise vigorously, try taking a short walk.  . If you are having trouble sleeping, talk to your nurse or doctor on tips to help you sleep better.  . If you are feeling depressed, talk to your nurse or doctor about it.  Marland Kitchen Keeping your pain under  control is important to your well-being. Please tell your doctor or nurse if you are experiencing pain.   . If you have diabetes, keep good control of your blood sugar level. Tell your nurse or your doctor if your glucose levels are higher or lower than normal.  . To decrease the risk of infection, wash your hands regularly.  . Avoid close contact with people who have a cold, the flu, or other infections.  . Take your temperature as your doctor or nurse tells you, and whenever you feel like you may have a fever.  . If you get a rash do not put anything on it unless your doctor or nurse says you may. Keep the area around the rash clean and dry. Ask your doctor for medicine if your rash bothers you.  . Moisturize your skin several times a day  . Avoid sun exposure and apply sunscreen routinely when outdoors.   Food and Drug Interactions  . This drug may interact with grapefruit and grapefruit juice. Talk to your doctor as this could make side effects worse.  . Check with your doctor or pharmacist about all other prescription medicines and over-the-counter medicines and dietary supplements (vitamins, minerals, herbs and others) you are taking before starting this medicine as there are known drug interactions with prednisone. Also, check with your doctor or pharmacist before starting any new prescription or over-the-counter medicines, or dietary supplements to make sure that there are no interactions.  . There are known interactions of prednisone with other medicines and products like aspirin, and other nonsteroidal anti-inflammatory agents. Ask your doctor what over-the-counter (OTC) medicines you can take.  . There are known interactions of prednisone with blood thinning medicine such as warfarin. Ask your doctor what precautions you should take.  Marland Kitchen Avoid the use of St. John's Wort while taking prednisone as this may lower the levels of the drug in your body, which can make it less  effective.  When to Call the Doctor  Call your doctor or nurse if you have any of these symptoms and/or any new or unusual symptoms:  . Fever of 100.4 F (38 C) or higher  . Chills  . A headache that does not go away  . Blurry vision or other changes in eyesight  . Feel irritable, nervous or restless  . Trouble sleeping  . Tiredness or weakness that interferes with your daily activities  . Trouble breathing  . Feeling that your heart is beating in a fast or not normal way (palpitations)  . Chest pain or symptoms of a heart attack. Most heart attacks involve pain in the center of the chest that lasts more than a few minutes. The pain may go away and come back or it can be constant. It can feel like pressure, squeezing, fullness, or pain. Sometimes pain is felt in one or both arms, the back, neck, jaw, or stomach. If any of these symptoms last 2 minutes, call 911.  . Nausea that stops you from eating or drinking and/or is not relieved by prescribed medicines  . Throwing up more than 3 times a day  . Severe abdominal pain that does not go away  . Heartburn or indigestion  . Abnormal blood sugar  . Severe mood changes such as depression or unusual thoughts and/or behaviors  . Thoughts of hurting yourself or others, and suicide  . Feeling hopeless on most days  . Unusual thirst, passing urine often, headache, sweating, shakiness, irritability  . Swelling of legs, ankles, or feet  . Weight gain of 5 pounds in one week (fluid retention)  . A new rash or a rash that is not relieved by prescribed medicines  . Signs of possible liver problems: dark urine, pale bowel movements, bad stomach pain, feeling very tired and weak, unusual itching, or yellowing of the eyes or skin  . Signs of allergic reaction: swelling of the face, feeling like your tongue or throat are swelling, trouble breathing, rash, itching, fever, chills, feeling dizzy, and/or feeling that your heart  is beating in a fast or not normal way. If this happens, call 911 for emergency care.  . If you think you may be pregnant  Reproduction Warnings  . Pregnancy warning: It is not known if this drug may harm an unborn child. For this reason, be sure to talk with your doctor if you are pregnant or planning to become pregnant while receiving this drug. Let your doctor know right away if you think you may be pregnant.  . Breastfeeding warning: It is not known if this drug passes into breast milk. For this reason, women should talk to their doctor about the risks and benefits of breastfeeding during treatment with this drug because this drug may enter the breast milk and cause harm to a breastfeeding baby.  . Fertility warning: Human fertility studies have not been done with this drug. Talk with your doctor or nurse if you plan to have children. Ask for information on sperm or egg banking.  You will get Neulasta (or similar) after every treatment of chemo:  Neulasta (or similar) - this medication is not chemotherapy but is being given because you have had chemo. It is usually given 24-48 hours after the completion of chemotherapy. This medication works by boosting your bone marrow's supply of white blood cells. White blood cells are what protect our bodies against infection. The medication is given in the form of a subcutaneous injection. It is  given in the fatty tissue of your abdomen, or in the skin on the back of your arm . It is a short needle. The major side effect of this medication is bone or muscle pain. The drug of choice to relieve or lessen the pain is Aleve or Ibuprofen. If a physician has ever told you not to take Aleve or Ibuprofen - then don't take it. You should then take Tylenol/acetaminophen. Take either medication as the bottle directs you to.  The level of pain you experience as a result of this injection can range from none, to mild or moderate, or severe. Please let us know if you  develop moderate or severe bone pain.   You can take Claritin 10 mg over the counter for a few days after receiving Neulasta to help with the bone aches and pains.      SELF CARE ACTIVITIES WHILE RECEIVING CHEMOTHERAPY:  Hydration Increase your fluid intake 48 hours prior to treatment and drink at least 8 to 12 cups (64 ounces) of water/decaffeinated beverages per day after treatment. You can still have your cup of coffee or soda but these beverages do not count as part of your 8 to 12 cups that you need to drink daily. No alcohol intake.  Medications Continue taking your normal prescription medication as prescribed.  If you start any new herbal or new supplements please let us know first to make sure it is safe.  Mouth Care Have teeth cleaned professionally before starting treatment. Keep dentures and partial plates clean. Use soft toothbrush and do not use mouthwashes that contain alcohol. Biotene is a good mouthwash that is available at most pharmacies or may be ordered by calling [phone]. Use warm salt water gargles (1 teaspoon salt per 1 quart warm water) before and after meals and at bedtime. If you need dental work, please let the doctor know before you go for your appointment so that we can coordinate the best possible time for you in regards to your chemo regimen. You need to also let your dentist know that you are actively taking chemo. We may need to do labs prior to your dental appointment.  Skin Care Always use sunscreen that has not expired and with SPF (Sun Protection Factor) of 50 or higher. Wear hats to protect your head from the sun. Remember to use sunscreen on your hands, ears, face, & feet.  Use good moisturizing lotions such as udder cream, eucerin, or even Vaseline. Some chemotherapies can cause dry skin, color changes in your skin and nails.    . Avoid long, hot showers or baths. . Use gentle, fragrance-free soaps and laundry detergent. . Use moisturizers, preferably  creams or ointments rather than lotions because the thicker consistency is better at preventing skin dehydration. Apply the cream or ointment within 15 minutes of showering. Reapply moisturizer at night, and moisturize your hands every time after you wash them.  Hair Loss (if your doctor says your hair will fall out)  . If your doctor says that your hair is likely to fall out, decide before you begin chemo whether you want to wear a wig. You may want to shop before treatment to match your hair color. . Hats, turbans, and scarves can also camouflage hair loss, although some people prefer to leave their heads uncovered. If you go bare-headed outdoors, be sure to use sunscreen on your scalp. . Cut your hair short. It eases the inconvenience of shedding lots of hair, but it also can  reduce the emotional impact of watching your hair fall out. . Don't perm or color your hair during chemotherapy. Those chemical treatments are already damaging to hair and can enhance hair loss. Once your chemo treatments are done and your hair has grown back, it's OK to resume dyeing or perming hair.  With chemotherapy, hair loss is almost always temporary. But when it grows back, it may be a different color or texture. In older adults who still had hair color before chemotherapy, the new growth may be completely gray.  Often, new hair is very fine and soft.  Infection Prevention Please wash your hands for at least 30 seconds using warm soapy water. Handwashing is the #1 way to prevent the spread of germs. Stay away from sick people or people who are getting over a cold. If you develop respiratory systems such as green/yellow mucus production or productive cough or persistent cough let us know and we will see if you need an antibiotic. It is a good idea to keep a pair of gloves on when going into grocery stores/[employer] to decrease your risk of coming into contact with germs on the carts, etc. Carry alcohol hand gel with you at  all times and use it frequently if out in public. If your temperature reaches 100.4 or higher please call the clinic and let us know.  If it is after hours or on the weekend please go to the ER if your temperature is over 100.4.  Please have your own personal thermometer at home to use.    Sex and bodily fluids If you are going to have sex, a condom must be used to protect the person that isn't taking chemotherapy. Chemo can decrease your libido (sex drive). For a few days after chemotherapy, chemotherapy can be excreted through your bodily fluids.  When using the toilet please close the lid and flush the toilet twice.  Do this for a few day after you have had chemotherapy.   Effects of chemotherapy on your sex life Some changes are simple and won't last long. They won't affect your sex life permanently.  Sometimes you may feel: . too tired . not strong enough to be very active . sick or sore  . not in the mood . anxious or low  Your anxiety might not seem related to sex. For example, you may be worried about the cancer and how your treatment is going. Or you may be worried about money, or about how you family are coping with your illness. These things can cause stress, which can affect your interest in sex. It's important to talk to your partner about how you feel. Remember - the changes to your sex life don't usually last long. There's usually no medical reason to stop having sex during chemo. The drugs won't have any long term physical effects on your performance or enjoyment of sex. Cancer can't be passed on to your partner during sex  Contraception It's important to use reliable contraception during treatment. Avoid getting pregnant while you or your partner are having chemotherapy. This is because the drugs may harm the baby. Sometimes chemotherapy drugs can leave a man or woman infertile.  This means you would not be able to have children in the future. You might want to talk to someone  about permanent infertility. It can be very difficult to learn that you may no longer be able to have children. Some people find counselling helpful. There might be ways to preserve your fertility,  although this is easier for men than for women. You may want to speak to a fertility expert. You can talk about sperm banking or harvesting your eggs. You can also ask about other fertility options, such as donor eggs. If you have or have had breast cancer, your doctor might advise you not to take the contraceptive pill. This is because the hormones in it might affect the cancer.  It is not known for sure whether or not chemotherapy drugs can be passed on through semen or secretions from the vagina. Because of this some doctors advise people to use a barrier method if you have sex during treatment. This applies to vaginal, anal or oral sex. Generally, doctors advise a barrier method only for the time you are actually having the treatment and for about a week after your treatment. Advice like this can be worrying, but this does not mean that you have to avoid being intimate with your partner. You can still have close contact with your partner and continue to enjoy sex.  Animals If you have cats or birds we just ask that you not change the litter or change the cage.  Please have someone else do this for you while you are on chemotherapy.   Food Safety During and After Cancer Treatment Food safety is important for people both during and after cancer treatment. Cancer and cancer treatments, such as chemotherapy, radiation therapy, and stem cell/bone marrow transplantation, often weaken the immune system. This makes it harder for your body to protect itself from foodborne illness, also called food poisoning. Foodborne illness is caused by eating food that contains harmful bacteria, parasites, or viruses.  Foods to avoid Some foods have a higher risk of becoming tainted with bacteria. These include: [place] Kitchen Unwashed fresh  fruit and vegetables, especially leafy vegetables that can hide dirt and other contaminants . Raw sprouts, such as alfalfa sprouts . Raw or undercooked beef, especially ground beef, or other raw or undercooked meat and poultry . Fatty, fried, or spicy foods immediately before or after treatment.  These can sit heavy on your stomach and make you feel nauseous. . Raw or undercooked shellfish, such as oysters. . Sushi and sashimi, which often contain raw fish.  . Unpasteurized beverages, such as unpasteurized fruit juices, raw milk, raw yogurt, or cider . Undercooked eggs, such as soft boiled, over easy, and poached; raw, unpasteurized eggs; or foods made with raw egg, such as homemade raw cookie dough and homemade mayonnaise  Simple steps for food safety  Shop smart. . Do not buy food stored or displayed in an unclean area. . Do not buy bruised or damaged fruits or vegetables. . Do not buy cans that have cracks, dents, or bulges. . Pick up foods that can spoil at the end of your shopping trip and store them in a cooler on the way home.  Prepare and clean up foods carefully. . Rinse all fresh fruits and vegetables under running water, and dry them with a clean towel or paper towel. . Clean the top of cans before opening them. . After preparing food, wash your hands for 20 seconds with hot water and soap. Pay special attention to areas between fingers and under nails. . Clean your utensils and dishes with hot water and soap. [place] Kitchen Disinfect your kitchen and cutting boards using 1 teaspoon of liquid, unscented bleach mixed into 1 quart of water.    Dispose of old food. . Eat canned and packaged food before its expiration  date (the "use by" or "best before" date). . Consume refrigerated leftovers within 3 to 4 days. After that time, throw out the food. Even if the food does not smell or look spoiled, it still may be unsafe. Some bacteria, such as Listeria, can grow even on foods stored in the  refrigerator if they are kept for too long.  Take precautions when eating out. . At restaurants, avoid buffets and salad bars where food sits out for a long time and comes in contact with many people. Food can become contaminated when someone with a virus, often a norovirus, or another "bug" handles it. . Put any leftover food in a "to-go" container yourself, rather than having the server do it. And, refrigerate leftovers as soon as you get home. . Choose restaurants that are clean and that are willing to prepare your food as you order it cooked.   AT HOME MEDICATIONS:                                                                                                                                                                Compazine/Prochlorperazine 10mg  tablet. Take 1 tablet every 6 hours as needed for nausea/vomiting. (This can make you sleepy)   EMLA cream. Apply a quarter size amount to port site 1 hour prior to chemo. Do not rub in. Cover with plastic wrap.    Diarrhea Sheet   If you are having loose stools/diarrhea, please purchase Imodium and begin taking as outlined:  At the first sign of poorly formed or loose stools you should begin taking Imodium (loperamide) 2 mg capsules. Take two tablets (4mg ) followed by one tablet (2mg ) every 2 hours - DO NOT EXCEED 8 tablets in 24 hours.  If it is bedtime and you are having loose stools, take 2 tablets at bedtime, then 2 tablets every 4 hours until morning.   Always call the [facility] if you are having loose stools/diarrhea that you can't get under control.  Loose stools/diarrhea leads to dehydration (loss of water) in your body.  We have other options of trying to get the loose stools/diarrhea to stop but you must let us know!  Constipation Sheet  Colace - 100 mg capsules - take 2 capsules daily.  If this doesn't help then you can increase to 2 capsules twice daily.  Please call if the above does not work for you. Do not go more than  2 days without a bowel movement.  It is very important that you do not become constipated.  It will make you feel sick to your stomach (nausea) and can cause abdominal pain and vomiting.  Nausea Sheet   Compazine/Prochlorperazine 10mg  tablet. Take 1 tablet every 6 hours as needed for nausea/vomiting (This can make you drowsy).  If you are having persistent nausea (nausea that does not stop) please call the [facility] and let us know the amount of nausea that you are experiencing.  If you begin to vomit, you need to call the [facility] and if it is the weekend and you have vomited more than one time and can't get it to stop-go to the Emergency Room.  Persistent nausea/vomiting can lead to dehydration (loss of fluid in your body) and will make you feel very weak and unwell. Ice chips, sips of clear liquids, foods that are at room temperature, crackers, and toast tend to be better tolerated.    SYMPTOMS TO REPORT AS SOON AS POSSIBLE AFTER TREATMENT:  FEVER GREATER THAN 100.4 F  CHILLS WITH OR WITHOUT FEVER  NAUSEA AND VOMITING THAT IS NOT CONTROLLED WITH YOUR NAUSEA MEDICATION  UNUSUAL SHORTNESS OF BREATH  UNUSUAL BRUISING OR BLEEDING  TENDERNESS IN MOUTH AND THROAT WITH OR WITHOUT   PRESENCE OF ULCERS  URINARY PROBLEMS  BOWEL PROBLEMS  UNUSUAL RASH    Wear comfortable clothing and clothing appropriate for easy access to any Portacath or PICC line. Let us know if there is anything that we can do to make your therapy better!   What to do if you need assistance after hours or on the weekends: CALL [phone].  HOLD on the line, do not hang up.  You will hear multiple messages but at the end you will be connected with a nurse triage line.  They will contact the doctor if necessary.  Most of the time they will be able to assist you.  Do not call the hospital operator.     I have been informed and understand all of the instructions given to me and have received a copy. I have  been instructed to call the clinic [phone] or my family physician as soon as possible for continued medical care, if indicated. I do not have any more questions at this time but understand that I may call the [facility] or the Patient Navigator at [phone] during office hours should I have questions or need assistance in obtaining follow-up care.

## 2020-08-21 NOTE — Patient Instructions (Signed)
[place] at [facility] Discharge Instructions  You were seen today by Dr. Delton Coombes. He went over your recent results. You will be referred back to radiology for port placement. Keep your appointment to get your echocardiogram. 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 Dr. Delton Coombes will see you back on 10/18 for labs and initiating treatment.   Thank you for choosing [place] at [facility] to provide your oncology and hematology care.  To afford each patient quality time with our provider, please arrive at least 15 minutes before your scheduled appointment time.   If you have a lab appointment with the [facility] please come in thru the Main Entrance and check in at the main information desk  You need to re-schedule your appointment should you arrive 10 or more minutes late.  We strive to give you quality time with our providers, and arriving late affects you and other patients whose appointments are after yours.  Also, if you no show three or more times for appointments you may be dismissed from the clinic at the providers discretion.     Again, thank you for choosing [facility].  Our hope is that these requests will decrease the amount of time that you wait before being seen by our physicians.       _____________________________________________________________  Should you have questions after your visit to [facility], please contact our office at [phone] between the hours of 8:00 a.m. and 4:30 p.m.  Voicemails left after 4:00 p.m. will not be returned until the following business day.  For prescription refill requests, have your pharmacy contact our office and allow 72 hours.    Cancer Center Support Programs:   > Cancer Support Group  2nd Tuesday of the month 1pm-2pm, Journey Room

## 2020-08-21 NOTE — Progress Notes (Signed)
[place], [place] [address]   CLINIC:  Medical Oncology/Hematology  PCP:  Lindell Spar, Richard Cox [address] / [place] [address]  [phone]  REASON FOR VISIT:  Follow-up for high-grade B-cell lymphoma  PRIOR THERAPY: None  CURRENT THERAPY: Under work-up  INTERVAL HISTORY:  Richard Cox, a 39 y.o. male, returns for routine follow-up for his high-grade B-cell lymphoma. Richard Cox was last seen on 08/01/2020.  Today he complains of intermittent abdominal pain in the LUQ and epigastric area and takes 7 tablets of oxycodone 5 mg, taking 2 tablets every hour, which eased the pain enough to do his errands. The pain starts when he eats or sits or reclines for a long time. He stopped drinking Ensure since he is not tolerating the lactose. His stool is soft, but he has BM's every 3 days; he is taking stool softeners once daily. He denies having N/V and he has an appetite, but fears eating anything.  He had a visit with his ID doctor for his CD4 levels on 10/12 and was started on Bactrim daily.   REVIEW OF SYSTEMS:  Review of Systems  Constitutional: Positive for appetite change (25%) and fatigue (25%).  Gastrointestinal: Positive for abdominal pain (8/10 intermittent LUQ and epigastric pain) and constipation (BM every 3 days). Negative for nausea and vomiting.  All other systems reviewed and are negative.   PAST MEDICAL/SURGICAL HISTORY:  Past Medical History:  Diagnosis Date  . CHF (congestive heart failure) ([place])   . Essential hypertension   . HIV infection ([place])   . Noncompliance with medication regimen   . 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 NO PAST SURGERIES      SOCIAL HISTORY:  Social History   Socioeconomic History  . Marital status: Single    Spouse name: Not on file  . Number of children: 0  . Years of education: 54  . Highest education level: Not on file   Occupational History  . Not on file  Tobacco Use  . Smoking status: Light Tobacco Smoker    Packs/day: 0.25    Types: Cigarettes  . Smokeless tobacco: Never Used  Vaping Use  . Vaping Use: Never used  Substance and Sexual Activity  . Alcohol use: Not Currently  . Drug use: Not Currently    Types: Marijuana    Comment: x 1 week  . Sexual activity: Not Currently    Comment: pt given condoms  Other Topics Concern  . Not on file  Social History Narrative  . Not on file   Social Determinants of Health   Financial Resource Strain:   . Difficulty of Paying Living Expenses: Not on file  Food Insecurity:   . Worried About Charity fundraiser in the Last Year: Not on file  . Ran Out of Food in the Last Year: Not on file  Transportation Needs:   . Lack of Transportation (Medical): Not on file  . Lack of Transportation (Non-Medical): Not on file  Physical Activity:   . Days of Exercise per Week: Not on file  . Minutes of Exercise per Session: Not on file  Stress:   . Feeling of Stress : Not on file  Social Connections:   . Frequency of Communication with Friends and Family: Not on file  . Frequency of Social Gatherings with Friends and Family: Not on file  . Attends Religious Services: Not on  file  . Active Member of Clubs or Organizations: Not on file  . Attends Archivist Meetings: Not on file  . Marital Status: Not on file  Intimate Partner Violence:   . Fear of Current or Ex-Partner: Not on file  . Emotionally Abused: Not on file  . Physically Abused: Not on file  . Sexually Abused: Not on file    FAMILY HISTORY:  Family History  Problem Relation Age of Onset  . Diabetes Mellitus II Mother   . Cancer Mother        multiple myeloma  . Cancer Father        multiple myeloma  . Congestive Heart Failure Brother   . Heart Problems Brother   . Lupus Brother     CURRENT MEDICATIONS:  Current Outpatient Medications  Medication Sig Dispense Refill  .  acetaminophen (TYLENOL) 325 MG tablet Take 2 tablets (650 mg total) by mouth every 6 (six) hours as needed for mild pain, fever or headache (or Fever >/= 101). 12 tablet 0  . bictegravir-emtricitabine-tenofovir AF (BIKTARVY) 50-200-25 MG TABS tablet Take 1 tablet by mouth daily. 30 tablet 5  . carvedilol (COREG) 6.25 MG tablet Take 1 tablet (6.25 mg total) by mouth 2 (two) times daily with a meal. For BP and Heart (Patient taking differently: Take 6.25 mg by mouth 2 (two) times daily with a meal. ) 60 tablet 11  . docusate sodium (COLACE) 100 MG capsule Take 100 mg by mouth daily as needed for mild constipation.    . furosemide (LASIX) 40 MG tablet Take 1 tablet (40 mg total) by mouth daily. 30 tablet 3  . hydrALAZINE (APRESOLINE) 50 MG tablet Take 50 mg by mouth 3 (three) times daily.    . isosorbide mononitrate (IMDUR) 30 MG 24 hr tablet Take 30 mg by mouth daily.    Marland Kitchen losartan (COZAAR) 50 MG tablet Take 1 tablet (50 mg total) by mouth daily. 90 tablet 3  . oxyCODONE (OXY IR/ROXICODONE) 5 MG immediate release tablet Take 2 tablets (10 mg total) by mouth every 6 (six) hours as needed for severe pain. 56 tablet 0  . oxyCODONE (OXYCONTIN) 15 mg 12 hr tablet Take 2 tablets (30 mg total) by mouth every 12 (twelve) hours. 30 tablet 0  . oxyCODONE-acetaminophen (PERCOCET) 10-325 MG tablet Take 1 tablet by mouth every 4 (four) hours as needed for pain. 84 tablet 0  . potassium chloride (KLOR-CON) 10 MEQ tablet Take 10 mEq by mouth daily.    Marland Kitchen sulfamethoxazole-trimethoprim (BACTRIM DS) 800-160 MG tablet Take 1 tablet by mouth daily. 30 tablet 5  . allopurinol (ZYLOPRIM) 300 MG tablet Take 1 tablet (300 mg total) by mouth daily for 20 days. 20 tablet 0  . dicyclomine (BENTYL) 10 MG capsule Take 1 capsule (10 mg total) by mouth 4 (four) times daily -  before meals and at bedtime for 15 days. 60 capsule 0   No current facility-administered medications for this visit.    ALLERGIES:  Allergies  Allergen  Reactions  . Neosporin [Neomycin-Bacitracin Zn-Polymyx] Rash    PHYSICAL EXAM:  Performance status (ECOG): 0 - Asymptomatic  Vitals:   08/21/20 0759  BP: (!) 161/99  Pulse: 70  Resp: 19  Temp: (!) 97.1 F (36.2 C)  SpO2: 98%   Wt Readings from Last 3 Encounters:  08/21/20 153 lb 12.8 oz (69.8 kg)  08/20/20 154 lb (69.9 kg)  08/15/20 157 lb (71.2 kg)   Physical Exam Vitals reviewed.  Constitutional:  Appearance: Normal appearance.  Cardiovascular:     Rate and Rhythm: Normal rate and regular rhythm.     Pulses: Normal pulses.     Heart sounds: Normal heart sounds.  Pulmonary:     Effort: Pulmonary effort is normal.     Breath sounds: Normal breath sounds.  Abdominal:     Palpations: Abdomen is soft. There is mass.     Tenderness: There is abdominal tenderness in the epigastric area and left upper quadrant.  Neurological:     General: No focal deficit present.     Mental Status: He is alert and oriented to person, place, and time.  Psychiatric:        Mood and Affect: Mood normal.        Behavior: Behavior normal.     LABORATORY DATA:  I have reviewed the labs as listed.  CBC Latest Ref Rng & Units 08/15/2020 07/24/2020 07/22/2020  WBC 4.0 - 10.5 K/uL 5.7 5.5 4.5  Hemoglobin 13.0 - 17.0 g/dL 10.9(L) 13.0 12.2(L)  Hematocrit 39 - 52 % 34.9(L) 40.6 37.3(L)  Platelets 150 - 400 K/uL 215 239 184   CMP Latest Ref Rng & Units 07/24/2020 07/22/2020 07/11/2020  Glucose 70 - 99 mg/dL 97 97 82  BUN 6 - 20 mg/dL 21(H) 17 16  Creatinine 0.61 - 1.24 mg/dL 1.36(H) 0.97 1.25  Sodium 135 - 145 mmol/L 137 140 139  Potassium 3.5 - 5.1 mmol/L 3.8 3.9 4.5  Chloride 98 - 111 mmol/L 101 103 104  CO2 22 - 32 mmol/L '26 25 26  ' Calcium 8.9 - 10.3 mg/dL 9.3 9.3 9.6  Total Protein 6.5 - 8.1 g/dL 8.0 7.7 7.8  Total Bilirubin 0.3 - 1.2 mg/dL 0.5 0.5 0.5  Alkaline Phos 38 - 126 U/L 72 64 -  AST 15 - 41 U/L '23 16 17  ' ALT 0 - 44 U/L 16 8 7(L)      Component Value Date/Time   RBC 3.99  (L) 08/15/2020 0900   MCV 87.5 08/15/2020 0900   MCH 27.3 08/15/2020 0900   MCHC 31.2 08/15/2020 0900   RDW 15.4 08/15/2020 0900   LYMPHSABS 1,669 03/06/2020 0933   MONOABS 0.4 03/11/2019 0409   EOSABS 41 03/06/2020 0933   BASOSABS 19 03/06/2020 0933   Surgical pathology ([id number]) on 08/15/2020: Upper abdominal soft tissue mass needle core biopsy: high-grade B-cell lymphoma, Ki-67 80-90%.  DIAGNOSTIC IMAGING:  I have independently reviewed the scans and discussed with the patient. DG Chest 2 View  Result Date: 07/24/2020 CLINICAL DATA:  Chest pain, shortness of breath EXAM: CHEST - 2 VIEW COMPARISON:  09/17/2019 FINDINGS: Cardiomegaly. No confluent airspace opacities, effusions or edema. No acute bony abnormality. IMPRESSION: Cardiomegaly.  No active disease. Electronically Signed   By: Rolm Baptise M.D.   On: 07/24/2020 10:34   CT ABDOMEN PELVIS W CONTRAST  Result Date: 07/24/2020 CLINICAL DATA:  Acute generalized abdominal pain and distention. EXAM: CT ABDOMEN AND PELVIS WITH CONTRAST TECHNIQUE: Multidetector CT imaging of the abdomen and pelvis was performed using the standard protocol following bolus administration of intravenous contrast. CONTRAST:  134m OMNIPAQUE IOHEXOL 300 MG/ML  SOLN COMPARISON:  None. FINDINGS: Lower chest: No acute abnormality. Hepatobiliary: No focal liver abnormality is seen. No gallstones, gallbladder wall thickening, or biliary dilatation. Pancreas: There is a large lobulated and heterogeneous mass in the epigastric region which appears to be arising from or involving the pancreatic body and tail, measuring 13.8 by 11.2 cm. This is highly concerning for malignancy. Spleen:  The mass described above appears to have medially abut the posterior spleen, and involvement of the spleen cannot be excluded. Adrenals/Urinary Tract: Adrenal glands are unremarkable. Kidneys are normal, without renal calculi, focal lesion, or hydronephrosis. Bladder is unremarkable.  Stomach/Bowel: The stomach appears to be significantly displaced anteriorly by the previously described mass. No definite evidence of large or small bowel dilatation is noted. 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 Multiple other lobulated large masses are seen in the mesenteric region concerning for adenopathy, and potentially the previously described mass in left upper quadrant may represent adenopathy. 9.3 x 5.1 cm mass is noted in the inferior pelvis. 9.7 x 4.1 cm mass is seen more superiorly in the pelvis. Probable 7.3 x 5.8 cm mass is noted in the right upper quadrant. Reproductive: Prostate is unremarkable. Other: Mild amount of free fluid is noted in the pelvis. Musculoskeletal: No acute or significant osseous findings. IMPRESSION: Large lobulated and heterogeneous mass is noted in the epigastric region which measures 13.8 x 11.2 cm and is concerning for adenopathy or other malignancy; it appears to be involving the body and tail of the pancreas, and potentially may be arising from the pancreas. This mass does appear to surround branches of the celiac artery as well as the splenic vein. 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 Multiple other lobulated masses are seen in the mesenteric region concerning for adenopathy or malignancy or possibly lymphoma. Mild amount of free fluid is noted in the pelvis. Electronically Signed   By: Marijo Conception M.D.   On: 07/24/2020 13:16   NM PET Image Initial (PI) Skull Base To Thigh  Result Date: 07/31/2020 CLINICAL DATA:   Initial treatment strategy for adenopathy. EXAM: NUCLEAR MEDICINE PET SKULL BASE TO THIGH TECHNIQUE: 7.9 mCi F-18 FDG was injected intravenously. Full-ring PET imaging was performed from the skull base to thigh after the radiotracer. CT data was obtained and used for attenuation correction and anatomic localization. Fasting blood glucose: 79 mg/dl COMPARISON:  CT AP 07/24/2020 FINDINGS: Mediastinal blood pool activity: SUV max 1.24 Liver activity: SUV max NA NECK: No hypermetabolic lymph nodes in the neck. Incidental CT findings: none CHEST: Mild diffuse tracer uptake noted within both lobes of thyroid gland. Tiny lymph node in the right axilla exhibits mild tracer uptake with SUV max of 4.21. Likely related to right upper extremity radiotracer injection site. No FDG avid mediastinal or hilar adenopathy. Triangular-shaped soft tissue density within the anterior mediastinum exhibits mild tracer uptake with SUV max of 2.51 and likely reflects thymic hyperplasia. Broad-based pleural base nodule overlying the right middle lobe measures 9 mm with SUV max of 4.24. Small scattered lung nodules (approximately 10) are identified, nonspecific these are all too small to reliably characterize by PET-CT. Example nodule in the superior segment of right lower lobe measures 4 mm, image 63/4. No pleural effusion, airspace consolidation, or atelectasis. Incidental CT findings: Cardiac enlargement. No aortic atherosclerosis or coronary artery calcifications. ABDOMEN/PELVIS: No abnormal FDG uptake identified within the liver, spleen, adrenal glands, or kidneys. The spleen is enlarged with a cranial caudal  dimension of 14.8 cm. No focal areas of abnormal uptake identified within the spleen. Multifocal FDG avid soft tissue masses are identified within the abdomen or pelvis. Index lesions include: -Intense FDG uptake is associated with the left upper quadrant mass which appears closely associated with the tail of pancreas. This mass  measures 13.8 x 12.5 cm with SUV max of 19.35, image 121/4. -Soft tissue mass within the small bowel mesentery (eccentric to the right) measures 8.9 x 6.2 cm with SUV max of 15.57, image 147/4. -Left lower quadrant small bowel mesenteric mass measures 5.1 x 3.7 cm within SUV max of 14.29, image 159/4. -Soft tissue mass within the right side of pelvis measures 10.4 x 4.6 cm with SUV max of 14.10, image 173/4. -Mass within left pelvis measures 9.2 x 4.6 cm within SUV max of 20.7, image 177/4. -Along the right pericolic gutter there is a soft tissue mass measuring 2.8 x 3.2 cm with SUV max of 17.48, image 157/4. -Mass overlying the right hepatic lobe measures 2.9 x 1.8 cm and has an SUV max of 16.19. Incidental CT findings: none SKELETON: No focal hypermetabolic activity to suggest skeletal metastasis. Incidental CT findings: none IMPRESSION: 1. Extensive, multifocal FDG avid soft tissue masses are identified within the abdomen and pelvis. Suspect lymphoproliferative disorder. Correlation with soft tissue sampling recommended. 2. Additional, FDG avid subpleural nodules overlying the right lung are noted also concerning for sites of lymphoproliferative disorder. 3. Splenomegaly. 4. Multiple small scattered lung nodules are identified in both lungs. The appearance of these nodules is nonspecific. These are all too small to reliably characterize by PET-CT. Attention on follow-up imaging advised. 5. Cardiac enlargement. Electronically Signed   By: Kerby Moors M.D.   On: 07/31/2020 09:14   CT Biopsy  Result Date: 08/15/2020 INDICATION: 39 year old male with a history of most likely lymphoma, referred for biopsy EXAM: CT BIOPSY MEDICATIONS: None. ANESTHESIA/SEDATION: Moderate (conscious) sedation was employed during this procedure. A total of Versed 1.5 mg and Fentanyl 50 mcg was administered intravenously. Moderate Sedation Time: 10 minutes. The patient's level of consciousness and vital signs were monitored  continuously by radiology nursing throughout the procedure under my direct supervision. FLUOROSCOPY TIME:  CT COMPLICATIONS: None PROCEDURE: Informed written consent was obtained from the patient after a thorough discussion of the procedural risks, benefits and alternatives. All questions were addressed. Maximal Sterile Barrier Technique was utilized including caps, mask, sterile gowns, sterile gloves, sterile drape, hand hygiene and skin antiseptic. A timeout was performed prior to the initiation of the procedure. Patient positioned supine position on CT gantry table. Scout CT was acquired for planning purposes. We targeted the sub a Paddock/gastrohepatic ligament mass for biopsy, in the subxiphoid region. Once the patient is prepped and draped in the usual sterile fashion, 1% lidocaine was used for local anesthesia. We then advanced trocar needle under CT guidance and multiple 18 gauge core biopsy were acquired. Needle was removed and a final image was stored. Patient tolerated the procedure well and remained hemodynamically stable throughout. No complications were encountered and no significant blood loss. IMPRESSION: Status post CT-guided biopsy of upper abdominal peritoneal mass. Signed, Dulcy Fanny. Dellia Nims, RPVI [facility] Electronically Signed   By: Corrie Mckusick D.O.   On: 08/15/2020 13:08     ASSESSMENT:  1.  B-cell high-grade lymphoma: -Presentation to the ER with left upper quadrant and mid abdominal pain for 2 weeks. -10 pound weight loss in the last 2 weeks due  to decreased appetite. Denies any fevers or night sweats. -CT AP with contrast on 07/24/2020 shows 13.8 11.2 cm mass in the epigastric region, lobulated large mass in the mesenteric region, 9.3 x 5.1 cm mass in the inferior pelvis, 9.7 x 4.1 cm mass in the superior pelvis. Epigastric mass seems to abut posterior spleen. -LDH was elevated at 309, uric acid 10.2. -PET scan on  07/31/2020 showed extensive multifocal soft tissue masses within the abdomen and pelvis, most of them between SUV 15 and 20.  FDG avid subpleural nodules overlying the right lung.  Splenomegaly.  Multiple scattered lung nodules, nonspecific. -Biopsy of the soft tissue mass in the upper abdomen on 08/15/2020 consistent with high-grade B-cell lymphoma.  IHC positive for CD20, CD10, BCL6, negative for BCL-2, CD3, CD5, CD30, mum 1 and EBV.  Ki-67 is 80-90%.  Differential diagnosis includes Burkitt's lymphoma another large B-cell lymphoma's.  2. Social/family history: -He quit smoking last year. -Family history significant for mother and father who had multiple myeloma. Grandfather had colon cancer.  3. 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  4. CHF: -Echocardiogram on 09/18/2019 shows EF 40-45%. -He is on Coreg 6.25 mg twice daily, Imdur 30 mg daily. Lasix 40 mg is an expired medication list.   PLAN:  1.  High-grade B cell lymphoma: -We reviewed results of the biopsy from 08/15/2020 with the patient in detail. -FISH panel for high risk lymphoma is pending at this time. -I have asked for emergent port placement. -We will also obtain 2D echocardiogram as soon as possible. -Chemotherapy regimens under consideration or dose adjusted R-EPOCH versus R-CHOP.  Only reason to consider dose adjusted R-EPOCH is his low ejection fraction.  We will also have to consider dose reduction because of his HIV status. -If Burkitt's lymphoma is confirmed, he will require intensive chemotherapy like R-CODOX-M/IVAC. -I have reached out to [place] specialist Dr. Mina Marble at [facility].  I will also reach out to Dr. Harrell Gave Dittus at [facility] who apparently has special interest in lymphomas in the setting of HIV.  2. Abdominal pain: -We had to change several treatment regimens as his pain is poorly controlled. -We will start him on MS Contin 30 mg every 12 hours. -He will take  oxycodone 10 mg every 6 hours as needed for breakthrough pain.  3. Nausea: -Continue Zofran ODT 4 mg every 8 hours as needed.  4. Hyperuricemia: -Continue allopurinol.  Latest uric acid is 4.2.  Orders placed this encounter:  No orders of the defined types were placed in this encounter.  Total time spent is 40 minutes with more than 50% of the time spent face-to-face discussing diagnosis, treatment plan, counseling and coordination of care.  Richard Jack, Richard Cox [place] [phone]   I, Milinda Antis, am acting as a scribe for Dr. Sanda Linger.  I, Richard Jack Richard Cox, have reviewed the above documentation for accuracy and completeness, and I agree with the above.

## 2020-08-22 ENCOUNTER — Other Ambulatory Visit: Payer: Self-pay

## 2020-08-22 ENCOUNTER — Ambulatory Visit (HOSPITAL_COMMUNITY)
Admission: RE | Admit: 2020-08-22 | Discharge: 2020-08-22 | Disposition: A | Discharge status: Home / Self Care | Payer: 59 | Source: Ambulatory Visit | Attending: Hematology | Admitting: Hematology

## 2020-08-22 ENCOUNTER — Other Ambulatory Visit (HOSPITAL_COMMUNITY): Payer: Self-pay

## 2020-08-22 ENCOUNTER — Inpatient Hospital Stay (HOSPITAL_COMMUNITY): Payer: 59

## 2020-08-22 DIAGNOSIS — R591 Generalized enlarged lymph nodes: Secondary | ICD-10-CM | POA: Diagnosis present

## 2020-08-22 DIAGNOSIS — R590 Localized enlarged lymph nodes: Secondary | ICD-10-CM | POA: Diagnosis present

## 2020-08-22 DIAGNOSIS — Z0189 Encounter for other specified special examinations: Secondary | ICD-10-CM | POA: Diagnosis not present

## 2020-08-22 DIAGNOSIS — C8510 Unspecified B-cell lymphoma, unspecified site: Secondary | ICD-10-CM

## 2020-08-22 LAB — ECHOCARDIOGRAM COMPLETE
Area-P 1/2: 3.51 cm2
S' Lateral: 3.27 cm

## 2020-08-22 MED ORDER — PREDNISONE 20 MG PO TABS: 60.0000 mg | ORAL_TABLET | Freq: Every day | ORAL | 0 refills | Status: DC

## 2020-08-22 NOTE — Progress Notes (Signed)
Chemotherapy and immunotherapy education packet given and discussed with pt and family in detail. Discussed diagnosis and staging, tx regimen, and intent of tx. Reviewed chemotherapy and immunotherapy medications and side effects, as well as pre-medications. Instructed on how to manage side effects at home, and when to call the clinic. Importance of fever/chills discussed with pt and family. Discussed precautions to implement at home after receiving tx, as well as self care strategies. Phone numbers provided for clinic during regular working hours, also how to reach the clinic after hours and on weekends. Pt and family provided the opportunity to ask questions - all questions answered to pt's and family satisfaction.   

## 2020-08-22 NOTE — H&P (Signed)
Chief Complaint: Patient was seen in consultation today for tunneled central venous catheter with port placement.  Referring Physician(s): Firefighter  Supervising Physician: Ruthann Cancer  Patient Status: [facility] - Out-pt  History of Present Illness: Richard Cox is a 39 y.o. male with a past medical history significant for HIV, HTN, cardiomyopathy, CHF and recently diagnosed high grade B-cell lymphoma who presents today for placement of a tunneled central venous catheter with port placement. Richard Cox was seen in IR on 08/15/20 for a peritoneal mass biopsy and pathology of this biopsy showed high-grade B-cell lymphoma. He was seen by Dr. Delton Coombes on 08/21/20 and decision was made to proceed with systemic therapy. IR has been asked to place a port for durable venous access.  Richard Cox denies any complaints currently, he feels that his abdominal pain is starting to recur because the last time he took morphine was around 11 pm last night but otherwise he is feeling well. He is concerned about seeing anything during the procedure and is pleased to hear that we will be giving him the same sedation medications as last time because those worked very well. He understands the procedure today and is agreeable to proceed as planned.  Past Medical History:  Diagnosis Date  . CHF (congestive heart failure) ([place])   . Essential hypertension   . HIV infection ([place])   . Noncompliance with medication regimen   . Secondary cardiomyopathy [facility])     Past Surgical History:  Procedure Laterality Date  . NO PAST SURGERIES      Allergies: Neosporin [neomycin-bacitracin zn-polymyx]  Medications: Prior to Admission medications   Medication Sig Start Date End Date Taking? Authorizing Provider  acetaminophen (TYLENOL) 500 MG tablet Take 1,000 mg by mouth every 6 (six) hours as needed for moderate pain or headache.   Yes [provider]  bictegravir-emtricitabine-tenofovir AF (BIKTARVY)  50-200-25 MG TABS tablet Take 1 tablet by mouth daily. 08/20/20  Yes Golden Circle, FNP  carvedilol (COREG) 6.25 MG tablet Take 1 tablet (6.25 mg total) by mouth 2 (two) times daily with a meal. For BP and Heart Patient taking differently: Take 6.25 mg by mouth 2 (two) times daily with a meal.  09/19/19  Yes Emokpae, Courage, MD  docusate sodium (COLACE) 100 MG capsule Take 100 mg by mouth daily as needed for mild constipation.   Yes [provider]  furosemide (LASIX) 40 MG tablet Take 1 tablet (40 mg total) by mouth daily. 07/30/20 11/27/20 Yes Lindell Spar, MD  hydrALAZINE (APRESOLINE) 50 MG tablet Take 50 mg by mouth 3 (three) times daily. 07/22/20  Yes [provider]  isosorbide mononitrate (IMDUR) 30 MG 24 hr tablet Take 30 mg by mouth daily. 04/30/20  Yes [provider]  losartan (COZAAR) 50 MG tablet Take 1 tablet (50 mg total) by mouth daily. 07/30/20  Yes Lindell Spar, MD  potassium chloride (KLOR-CON) 10 MEQ tablet Take 10 mEq by mouth daily. 04/30/20  Yes [provider]  sulfamethoxazole-trimethoprim (BACTRIM DS) 800-160 MG tablet Take 1 tablet by mouth daily. 08/20/20  Yes Golden Circle, FNP  allopurinol (ZYLOPRIM) 300 MG tablet Take 1 tablet (300 mg total) by mouth daily for 20 days. Patient not taking: Reported on 08/21/2020 07/24/20 08/21/20  Long, Wonda Olds, MD  dicyclomine (BENTYL) 10 MG capsule Take 1 capsule (10 mg total) by mouth 4 (four) times daily -  before meals and at bedtime for 15 days. Patient not taking: Reported on 08/21/2020 07/30/20 08/21/20  Lindell Spar, MD  morphine (MS CONTIN) 30 MG 12 hr tablet Take 1 tablet (30 mg total) by mouth every 12 (twelve) hours. 08/21/20   Derek Jack, MD  oxyCODONE (OXY IR/ROXICODONE) 5 MG immediate release tablet Take 2 tablets (10 mg total) by mouth every 6 (six) hours as needed for severe pain. Patient not taking: Reported on 08/21/2020 08/19/20   Derek Jack, MD   oxyCODONE (OXYCONTIN) 15 mg 12 hr tablet Take 2 tablets (30 mg total) by mouth every 12 (twelve) hours. Patient not taking: Reported on 08/21/2020 08/19/20   Derek Jack, MD  oxyCODONE-acetaminophen (PERCOCET) 10-325 MG tablet Take 1 tablet by mouth every 4 (four) hours as needed for pain. Patient not taking: Reported on 08/21/2020 08/13/20   Derek Jack, MD     Family History  Problem Relation Age of Onset  . Diabetes Mellitus II Mother   . Cancer Mother        multiple myeloma  . Cancer Father        multiple myeloma  . Congestive Heart Failure Brother   . Heart Problems Brother   . Lupus Brother     Social History   Socioeconomic History  . Marital status: Single    Spouse name: Not on file  . Number of children: 0  . Years of education: 26  . Highest education level: Not on file  Occupational History  . Not on file  Tobacco Use  . Smoking status: Light Tobacco Smoker    Packs/day: 0.25    Types: Cigarettes  . Smokeless tobacco: Never Used  Vaping Use  . Vaping Use: Never used  Substance and Sexual Activity  . Alcohol use: Not Currently  . Drug use: Not Currently    Types: Marijuana    Comment: x 1 week  . Sexual activity: Not Currently    Comment: pt given condoms  Other Topics Concern  . Not on file  Social History Narrative  . Not on file   Social Determinants of Health   Financial Resource Strain:   . Difficulty of Paying Living Expenses: Not on file  Food Insecurity:   . Worried About Charity fundraiser in the Last Year: Not on file  . Ran Out of Food in the Last Year: Not on file  Transportation Needs:   . Lack of Transportation (Medical): Not on file  . Lack of Transportation (Non-Medical): Not on file  Physical Activity:   . Days of Exercise per Week: Not on file  . Minutes of Exercise per Session: Not on file  Stress:   . Feeling of Stress : Not on file  Social Connections:   . Frequency of Communication with Friends and  Family: Not on file  . Frequency of Social Gatherings with Friends and Family: Not on file  . Attends Religious Services: Not on file  . Active Member of Clubs or Organizations: Not on file  . Attends Archivist Meetings: Not on file  . Marital Status: Not on file     Review of Systems: A 12 point ROS discussed and pertinent positives are indicated in the HPI above.  All other systems are negative.  Review of Systems  Constitutional: Negative for chills and fever.  Respiratory: Negative for cough and shortness of breath.   Cardiovascular: Negative for chest pain.  Gastrointestinal: Positive for abdominal pain. Negative for diarrhea, nausea and vomiting.  Genitourinary: Negative for flank pain.  Musculoskeletal: Negative for back pain.  Neurological: Negative  for dizziness and headaches.    Vital Signs: BP (!) 140/96 (BP Location: Right Arm)   Pulse 74   Temp 97.9 F (36.6 C) (Oral)   Resp 18   SpO2 100%   Physical Exam Vitals reviewed.  HENT:     Head: Normocephalic.  Cardiovascular:     Rate and Rhythm: Normal rate and regular rhythm.  Pulmonary:     Effort: Pulmonary effort is normal.     Breath sounds: Normal breath sounds.  Abdominal:     General: There is no distension.     Palpations: Abdomen is soft.     Tenderness: There is no abdominal tenderness.  Skin:    General: Skin is warm and dry.  Neurological:     Mental Status: He is alert and oriented to person, place, and time.  Psychiatric:        Mood and Affect: Mood normal.        Behavior: Behavior normal.        Thought Content: Thought content normal.        Judgment: Judgment normal.      MD Evaluation Airway: WNL Heart: WNL Abdomen: WNL Chest/ Lungs: WNL ASA  Classification: 3 Mallampati/Airway Score: Two   Imaging: DG Chest 2 View  Result Date: 07/24/2020 CLINICAL DATA:  Chest pain, shortness of breath EXAM: CHEST - 2 VIEW COMPARISON:  09/17/2019 FINDINGS: Cardiomegaly. No  confluent airspace opacities, effusions or edema. No acute bony abnormality. IMPRESSION: Cardiomegaly.  No active disease. Electronically Signed   By: Rolm Baptise M.D.   On: 07/24/2020 10:34   CT ABDOMEN PELVIS W CONTRAST  Result Date: 07/24/2020 CLINICAL DATA:  Acute generalized abdominal pain and distention. EXAM: CT ABDOMEN AND PELVIS WITH CONTRAST TECHNIQUE: Multidetector CT imaging of the abdomen and pelvis was performed using the standard protocol following bolus administration of intravenous contrast. CONTRAST:  178m OMNIPAQUE IOHEXOL 300 MG/ML  SOLN COMPARISON:  None. FINDINGS: Lower chest: No acute abnormality. Hepatobiliary: No focal liver abnormality is seen. No gallstones, gallbladder wall thickening, or biliary dilatation. Pancreas: There is a large lobulated and heterogeneous mass in the epigastric region which appears to be arising from or involving the pancreatic body and tail, measuring 13.8 by 11.2 cm. This is highly concerning for malignancy. Spleen: The mass described above appears to have medially abut the posterior spleen, and involvement of the spleen cannot be excluded. Adrenals/Urinary Tract: Adrenal glands are unremarkable. Kidneys are normal, without renal calculi, focal lesion, or hydronephrosis. Bladder is unremarkable. Stomach/Bowel: The stomach appears to be significantly displaced anteriorly by the previously described mass. No definite evidence of large or small bowel dilatation is noted. 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 Multiple other lobulated large masses are seen in the mesenteric region concerning for adenopathy, and potentially the previously described mass in left upper quadrant may represent adenopathy. 9.3 x 5.1 cm mass is noted in  the inferior pelvis. 9.7 x 4.1 cm mass is seen more superiorly in the pelvis. Probable 7.3 x 5.8 cm mass is noted in the right upper quadrant. Reproductive: Prostate is unremarkable. Other: Mild amount of free fluid is noted in the pelvis. Musculoskeletal: No acute or significant osseous findings. IMPRESSION: Large lobulated and heterogeneous mass is noted in the epigastric region which measures 13.8 x 11.2  cm and is concerning for adenopathy or other malignancy; it appears to be involving the body and tail of the pancreas, and potentially may be arising from the pancreas. This mass does appear to surround branches of the celiac artery as well as the splenic vein. 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 Multiple other lobulated masses are seen in the mesenteric region concerning for adenopathy or malignancy or possibly lymphoma. Mild amount of free fluid is noted in the pelvis. Electronically Signed   By: Marijo Conception M.D.   On: 07/24/2020 13:16   NM PET Image Initial (PI) Skull Base To Thigh  Result Date: 07/31/2020 CLINICAL DATA:  Initial treatment strategy for adenopathy. EXAM: NUCLEAR MEDICINE PET SKULL BASE TO THIGH TECHNIQUE: 7.9 mCi F-18 FDG was injected intravenously. Full-ring PET imaging was performed from the skull base to thigh after the radiotracer. CT data was obtained and used for attenuation correction and anatomic localization. Fasting blood glucose: 79 mg/dl COMPARISON:  CT AP 07/24/2020 FINDINGS: Mediastinal blood pool activity: SUV max 1.24 Liver activity: SUV max NA NECK: No hypermetabolic lymph nodes in the neck. Incidental CT findings: none CHEST: Mild diffuse tracer uptake noted within both lobes of thyroid gland. Tiny lymph node in the right axilla exhibits mild tracer  uptake with SUV max of 4.21. Likely related to right upper extremity radiotracer injection site. No FDG avid mediastinal or hilar adenopathy. Triangular-shaped soft tissue density within the anterior mediastinum exhibits mild tracer uptake with SUV max of 2.51 and likely reflects thymic hyperplasia. Broad-based pleural base nodule overlying the right middle lobe measures 9 mm with SUV max of 4.24. Small scattered lung nodules (approximately 10) are identified, nonspecific these are all too small to reliably characterize by PET-CT. Example nodule in the superior segment of right lower lobe measures 4 mm, image 63/4. No pleural effusion, airspace consolidation, or atelectasis. Incidental CT findings: Cardiac enlargement. No aortic atherosclerosis or coronary artery calcifications. ABDOMEN/PELVIS: No abnormal FDG uptake identified within the liver, spleen, adrenal glands, or kidneys. The spleen is enlarged with a cranial caudal dimension of 14.8 cm. No focal areas of abnormal uptake identified within the spleen. Multifocal FDG avid soft tissue masses are identified within the abdomen or pelvis. Index lesions include: -Intense FDG uptake is associated with the left upper quadrant mass which appears closely associated with the tail of pancreas. This mass measures 13.8 x 12.5 cm with SUV max of 19.35, image 121/4. -Soft tissue mass within the small bowel mesentery (eccentric to the right) measures 8.9 x 6.2 cm with SUV max of 15.57, image 147/4. -Left lower quadrant small bowel mesenteric mass measures 5.1 x 3.7 cm within SUV max of 14.29, image 159/4. -Soft tissue mass within the right side of pelvis measures 10.4 x 4.6 cm with SUV max of 14.10, image 173/4. -Mass within left pelvis measures 9.2 x 4.6 cm within SUV max of 20.7, image 177/4. -Along the right pericolic gutter there is a soft tissue mass measuring 2.8 x 3.2 cm with SUV max of 17.48, image 157/4. -Mass overlying the right hepatic lobe measures 2.9 x 1.8 cm  and has an SUV max of 16.19. Incidental CT findings: none SKELETON: No focal hypermetabolic activity to suggest skeletal metastasis. Incidental CT findings: none IMPRESSION: 1. Extensive, multifocal FDG avid  soft tissue masses are identified within the abdomen and pelvis. Suspect lymphoproliferative disorder. Correlation with soft tissue sampling recommended. 2. Additional, FDG avid subpleural nodules overlying the right lung are noted also concerning for sites of lymphoproliferative disorder. 3. Splenomegaly. 4. Multiple small scattered lung nodules are identified in both lungs. The appearance of these nodules is nonspecific. These are all too small to reliably characterize by PET-CT. Attention on follow-up imaging advised. 5. Cardiac enlargement. Electronically Signed   By: Kerby Moors M.D.   On: 07/31/2020 09:14   CT Biopsy  Result Date: 08/15/2020 INDICATION: 39 year old male with a history of most likely lymphoma, referred for biopsy EXAM: CT BIOPSY MEDICATIONS: None. ANESTHESIA/SEDATION: Moderate (conscious) sedation was employed during this procedure. A total of Versed 1.5 mg and Fentanyl 50 mcg was administered intravenously. Moderate Sedation Time: 10 minutes. The patient's level of consciousness and vital signs were monitored continuously by radiology nursing throughout the procedure under my direct supervision. FLUOROSCOPY TIME:  CT COMPLICATIONS: None PROCEDURE: Informed written consent was obtained from the patient after a thorough discussion of the procedural risks, benefits and alternatives. All questions were addressed. Maximal Sterile Barrier Technique was utilized including caps, mask, sterile gowns, sterile gloves, sterile drape, hand hygiene and skin antiseptic. A timeout was performed prior to the initiation of the procedure. Patient positioned supine position on CT gantry table. Scout CT was acquired for planning purposes. We targeted the sub a Paddock/gastrohepatic ligament mass for  biopsy, in the subxiphoid region. Once the patient is prepped and draped in the usual sterile fashion, 1% lidocaine was used for local anesthesia. We then advanced trocar needle under CT guidance and multiple 18 gauge core biopsy were acquired. Needle was removed and a final image was stored. Patient tolerated the procedure well and remained hemodynamically stable throughout. No complications were encountered and no significant blood loss. IMPRESSION: Status post CT-guided biopsy of upper abdominal peritoneal mass. Signed, Dulcy Fanny. Dellia Nims, RPVI [facility] Radiology Electronically Signed   By: Corrie Mckusick D.O.   On: 08/15/2020 13:08   ECHOCARDIOGRAM COMPLETE  Result Date: 08/22/2020    ECHOCARDIOGRAM REPORT   Patient Name:   Richard Cox Date of Exam: 08/22/2020 Medical Rec #:  [id number]  Height:       73.0 in Accession #:    [id number] Weight:       153.8 lb Date of Birth:  31-Oct-1981   BSA:          1.924 m Patient Age:    39 years   BP:           148/87 mmHg Patient Gender: M          HR:           77 bpm. Exam Location:  [facility] Procedure: 2D Echo, Cardiac Doppler and Color Doppler Indications:    Chemotherapy evaluation v87.41 / v58.11  History:        Patient has prior history of Echocardiogram examinations, most                 recent 09/18/2019. CHF; Risk Factors:Hypertension. AIDS (acquired                 immune deficiency syndrome), Alcohol abuse, Polysubstance abuse                 -cocaine use in remission, Secondary cardiomyopathy ([place]) (From  Hx).  Sonographer:    Alvino Chapel RCS Referring Phys: [phone] [place]  1. Left ventricular ejection fraction, by estimation, is 45 to 50%. The left ventricle has mildly decreased function. The left ventricle demonstrates global hypokinesis. There is severe left ventricular hypertrophy, septum greater than posterior wall. Left ventricular diastolic parameters are  indeterminate. Consistent with hypertrophic cardiomyoapthy, although myocardial acoustic appearance suggests possible infiltrative cardiomyopathy.  2. Right ventricular systolic function is normal. The right ventricular size is normal. Moderately increased right ventricular wall thickness. Tricuspid regurgitation signal is inadequate for assessing PA pressure.  3. Left atrial size was moderately dilated.  4. The mitral valve is grossly normal. No evidence of mitral valve regurgitation.  5. The aortic valve is tricuspid. Aortic valve regurgitation is not visualized.  6. The inferior vena cava is normal in size with greater than 50% respiratory variability, suggesting right atrial pressure of 3 mmHg. FINDINGS  Left Ventricle: Left ventricular ejection fraction, by estimation, is 45 to 50%. The left ventricle has mildly decreased function. The left ventricle demonstrates global hypokinesis. The left ventricular internal cavity size was normal in size. There is  severe left ventricular hypertrophy. Left ventricular diastolic parameters are indeterminate. Right Ventricle: The right ventricular size is normal. Moderately increased right ventricular wall thickness. Right ventricular systolic function is normal. Tricuspid regurgitation signal is inadequate for assessing PA pressure. Left Atrium: Left atrial size was moderately dilated. Right Atrium: Right atrial size was normal in size. Pericardium: There is no evidence of pericardial effusion. Mitral Valve: The mitral valve is grossly normal. No evidence of mitral valve regurgitation. Tricuspid Valve: The tricuspid valve is grossly normal. Tricuspid valve regurgitation is trivial. Aortic Valve: The aortic valve is tricuspid. Aortic valve regurgitation is not visualized. Pulmonic Valve: The pulmonic valve was grossly normal. Pulmonic valve regurgitation is trivial. Aorta: The aortic root is normal in size and structure. Venous: The inferior vena cava is normal in size with  greater than 50% respiratory variability, suggesting right atrial pressure of 3 mmHg. IAS/Shunts: No atrial level shunt detected by color flow Doppler.  LEFT VENTRICLE PLAX 2D LVIDd:         4.23 cm  Diastology LVIDs:         3.27 cm  LV e' medial:    4.13 cm/s LV PW:         2.29 cm  LV E/e' medial:  14.8 LV IVS:        3.30 cm  LV e' lateral:   7.40 cm/s LVOT diam:     2.50 cm  LV E/e' lateral: 8.3 LV SV:         126 LV SV Index:   66 LVOT Area:     4.91 cm  RIGHT VENTRICLE RV S prime:     12.60 cm/s TAPSE (M-mode): 2.2 cm LEFT ATRIUM             Index       RIGHT ATRIUM           Index LA diam:        5.10 cm 2.65 cm/m  RA Area:     16.20 cm LA Vol (A2C):   88.5 ml 45.99 ml/m RA Volume:   42.00 ml  21.83 ml/m LA Vol (A4C):   88.4 ml 45.94 ml/m LA Biplane Vol: 90.6 ml 47.08 ml/m  AORTIC VALVE LVOT Vmax:   143.00 cm/s LVOT Vmean:  83.200 cm/s LVOT VTI:    0.257 m  AORTA Ao  Root diam: 3.40 cm MITRAL VALVE MV Area (PHT): 3.51 cm    SHUNTS MV Decel Time: 216 msec    Systemic VTI:  0.26 m MV E velocity: 61.30 cm/s  Systemic Diam: 2.50 cm MV A velocity: 58.50 cm/s MV E/A ratio:  1.05 Rozann Lesches MD Electronically signed by Rozann Lesches MD Signature Date/Time: 08/22/2020/12:19:50 PM    Final     Labs:  CBC: Recent Labs    03/06/20 0933 07/22/20 0421 07/24/20 1040 08/15/20 0900  WBC 3.7* 4.5 5.5 5.7  HGB 12.7* 12.2* 13.0 10.9*  HCT 37.5* 37.3* 40.6 34.9*  PLT 157 184 239 215    COAGS: Recent Labs    08/15/20 0900  INR 1.2    BMP: Recent Labs    09/26/19 0928 09/26/19 0928 01/04/20 0902 01/04/20 0902 03/06/20 0933 07/11/20 0834 07/22/20 0421 07/24/20 1040  NA 137   < > 138   < > 138 139 140 137  K 4.5   < > 4.0   < > 4.4 4.5 3.9 3.8  CL 104   < > 104   < > 105 104 103 101  CO2 32   < > 29   < > _0 GLUCOSE 82   < > 64*   < > 87 82 97 97  BUN 22   < > 19   < > _1 21*  CALCIUM 9.5   < > 9.1   < > 9.5 9.6 9.3 9.3  CREATININE 1.05   < > 1.20   < > 1.26  1.25 0.97 1.36*  GFRNONAA 91  --  77  --   --   --  >60 >60  GFRAA 105  --  90  --   --   --  >60 >60   < > = values in this interval not displayed.    LIVER FUNCTION TESTS: Recent Labs    03/06/20 0933 07/11/20 0834 07/22/20 0421 07/24/20 1040  BILITOT 0.3 0.5 0.5 0.5  AST _2 ALT 11 7* 8 16  ALKPHOS  --   --  64 72  PROT 8.0 7.8 7.7 8.0  ALBUMIN  --   --  3.7 3.7    TUMOR MARKERS: No results for input(s): AFPTM, CEA, CA199, CHROMGRNA in the last 8760 hours.  Assessment and Plan:  39 y/o M with recently diagnosed high-grade B cell lymphoma planned for systemic chemotherapy who presents today for a tunneled central venous catheter with port placement.   Patient has been NPO since midnight, no current anticoagulation/antiplatelet medications. Last dose PO morphine around 11 pm last night per patient.  Risks and benefits of image-guided Port-a-catheter placement were discussed with the patient including, but not limited to bleeding, infection, pneumothorax, or fibrin sheath development and need for additional procedures.  All of the patient's questions were answered, patient is agreeable to proceed.  Consent signed and in chart.  Thank you for this interesting consult.  I greatly enjoyed meeting Richard Cox and look forward to participating in their care.  A copy of this report was sent to the requesting provider on this date.  Electronically Signed: Joaquim Nam, PA-C 08/22/2020, 2:45 PM   I spent a total of15 Minutes in face to face in clinical consultation, greater than 50% of which was counseling/coordinating care for port placement.

## 2020-08-22 NOTE — Progress Notes (Signed)
*  PRELIMINARY RESULTS* Echocardiogram 2D Echocardiogram has been performed.  Samuel Germany 08/22/2020, 10:16 AM

## 2020-08-23 ENCOUNTER — Ambulatory Visit (HOSPITAL_COMMUNITY)
Admission: RE | Admit: 2020-08-23 | Discharge: 2020-08-23 | Disposition: A | Discharge status: Home / Self Care | Payer: 59 | Source: Ambulatory Visit | Attending: Hematology | Admitting: Hematology

## 2020-08-23 ENCOUNTER — Other Ambulatory Visit: Payer: Self-pay

## 2020-08-23 DIAGNOSIS — C8593 Non-Hodgkin lymphoma, unspecified, intra-abdominal lymph nodes: Secondary | ICD-10-CM

## 2020-08-23 DIAGNOSIS — Z79899 Other long term (current) drug therapy: Secondary | ICD-10-CM | POA: Insufficient documentation

## 2020-08-23 DIAGNOSIS — I110 Hypertensive heart disease with heart failure: Secondary | ICD-10-CM | POA: Diagnosis not present

## 2020-08-23 DIAGNOSIS — C8590 Non-Hodgkin lymphoma, unspecified, unspecified site: Secondary | ICD-10-CM | POA: Insufficient documentation

## 2020-08-23 DIAGNOSIS — F17210 Nicotine dependence, cigarettes, uncomplicated: Secondary | ICD-10-CM | POA: Insufficient documentation

## 2020-08-23 DIAGNOSIS — I509 Heart failure, unspecified: Secondary | ICD-10-CM | POA: Diagnosis not present

## 2020-08-23 DIAGNOSIS — I429 Cardiomyopathy, unspecified: Secondary | ICD-10-CM | POA: Diagnosis not present

## 2020-08-23 DIAGNOSIS — Z21 Asymptomatic human immunodeficiency virus [HIV] infection status: Secondary | ICD-10-CM | POA: Diagnosis not present

## 2020-08-23 HISTORY — PX: IR IMAGING GUIDED PORT INSERTION: IMG5740

## 2020-08-23 LAB — PROTIME-INR
INR: 1 (ref 0.8–1.2)
Prothrombin Time: 12.7 seconds (ref 11.4–15.2)

## 2020-08-23 MED ORDER — FENTANYL CITRATE (PF) 100 MCG/2ML IJ SOLN
INTRAMUSCULAR | Status: AC | PRN
  Administered 2020-08-23 (×2): 25 ug via INTRAVENOUS
  Administered 2020-08-23: 50 ug via INTRAVENOUS

## 2020-08-23 MED ORDER — LIDOCAINE-EPINEPHRINE 1 %-1:100000 IJ SOLN
INTRAMUSCULAR | Status: AC
  Filled 2020-08-23: qty 1

## 2020-08-23 MED ORDER — MIDAZOLAM HCL 2 MG/2ML IJ SOLN
INTRAMUSCULAR | Status: AC | PRN
  Administered 2020-08-23 (×2): 0.5 mg via INTRAVENOUS
  Administered 2020-08-23: 1 mg via INTRAVENOUS

## 2020-08-23 MED ORDER — CEFAZOLIN SODIUM-DEXTROSE 2-4 GM/100ML-% IV SOLN
2.0000 g | Freq: Once | INTRAVENOUS | Status: AC
  Administered 2020-08-23: 2 g via INTRAVENOUS

## 2020-08-23 MED ORDER — HEPARIN SOD (PORK) LOCK FLUSH 100 UNIT/ML IV SOLN
INTRAVENOUS | Status: AC
  Filled 2020-08-23: qty 5

## 2020-08-23 MED ORDER — MIDAZOLAM HCL 2 MG/2ML IJ SOLN
INTRAMUSCULAR | Status: AC
  Filled 2020-08-23: qty 2

## 2020-08-23 MED ORDER — CEFAZOLIN SODIUM-DEXTROSE 2-4 GM/100ML-% IV SOLN
INTRAVENOUS | Status: AC
  Filled 2020-08-23: qty 100

## 2020-08-23 MED ORDER — FENTANYL CITRATE (PF) 100 MCG/2ML IJ SOLN
INTRAMUSCULAR | Status: AC
  Filled 2020-08-23: qty 2

## 2020-08-23 NOTE — Discharge Instructions (Signed)

## 2020-08-23 NOTE — Procedures (Signed)
Interventional Radiology Procedure Note  Procedure: Single Lumen Power Port Placement    Access:  Right internal jugular vein  Findings: Catheter tip positioned at cavoatrial junction. Port is ready for immediate use.   Complications: None  EBL: < 10 mL  Recommendations:  - Ok to shower in 24 hours - Do not submerge for 14 days - Routine line care  - Line ready for immediate use   Ruthann Cancer, MD Pager: [phone]

## 2020-08-26 ENCOUNTER — Ambulatory Visit (HOSPITAL_COMMUNITY): Payer: 59

## 2020-08-26 ENCOUNTER — Other Ambulatory Visit (HOSPITAL_COMMUNITY): Payer: Self-pay | Admitting: *Deleted

## 2020-08-26 ENCOUNTER — Ambulatory Visit (HOSPITAL_COMMUNITY): Payer: 59 | Admitting: Hematology

## 2020-08-26 ENCOUNTER — Encounter (HOSPITAL_COMMUNITY): Payer: Self-pay | Admitting: Hematology

## 2020-08-26 ENCOUNTER — Other Ambulatory Visit (HOSPITAL_COMMUNITY): Payer: 59

## 2020-08-26 ENCOUNTER — Inpatient Hospital Stay (HOSPITAL_COMMUNITY): Payer: 59

## 2020-08-26 ENCOUNTER — Inpatient Hospital Stay (HOSPITAL_BASED_OUTPATIENT_CLINIC_OR_DEPARTMENT_OTHER): Payer: 59 | Admitting: Hematology

## 2020-08-26 ENCOUNTER — Other Ambulatory Visit: Payer: Self-pay

## 2020-08-26 ENCOUNTER — Encounter (HOSPITAL_COMMUNITY): Payer: Self-pay

## 2020-08-26 VITALS — BP 140/81 | HR 63 | Temp 97.5°F | Resp 18 | Wt 155.0 lb

## 2020-08-26 DIAGNOSIS — C8510 Unspecified B-cell lymphoma, unspecified site: Secondary | ICD-10-CM | POA: Diagnosis not present

## 2020-08-26 DIAGNOSIS — Z5111 Encounter for antineoplastic chemotherapy: Secondary | ICD-10-CM | POA: Diagnosis not present

## 2020-08-26 LAB — CBC WITH DIFFERENTIAL/PLATELET
Abs Immature Granulocytes: 0.03 10*3/uL (ref 0.00–0.07)
Basophils Absolute: 0 10*3/uL (ref 0.0–0.1)
Basophils Relative: 0 %
Eosinophils Absolute: 0 10*3/uL (ref 0.0–0.5)
Eosinophils Relative: 0 %
HCT: 32.8 % — ABNORMAL LOW (ref 39.0–52.0)
Hemoglobin: 10.8 g/dL — ABNORMAL LOW (ref 13.0–17.0)
Immature Granulocytes: 0 %
Lymphocytes Relative: 14 %
Lymphs Abs: 0.9 10*3/uL (ref 0.7–4.0)
MCH: 28.9 pg (ref 26.0–34.0)
MCHC: 32.9 g/dL (ref 30.0–36.0)
MCV: 87.7 fL (ref 80.0–100.0)
Monocytes Absolute: 0.3 10*3/uL (ref 0.1–1.0)
Monocytes Relative: 4 %
Neutro Abs: 5.6 10*3/uL (ref 1.7–7.7)
Neutrophils Relative %: 82 %
Platelets: 247 10*3/uL (ref 150–400)
RBC: 3.74 MIL/uL — ABNORMAL LOW (ref 4.22–5.81)
RDW: 15.9 % — ABNORMAL HIGH (ref 11.5–15.5)
WBC: 6.8 10*3/uL (ref 4.0–10.5)
nRBC: 0 % (ref 0.0–0.2)

## 2020-08-26 LAB — COMPREHENSIVE METABOLIC PANEL
ALT: 12 U/L (ref 0–44)
AST: 12 U/L — ABNORMAL LOW (ref 15–41)
Albumin: 3.2 g/dL — ABNORMAL LOW (ref 3.5–5.0)
Alkaline Phosphatase: 78 U/L (ref 38–126)
Anion gap: 12 (ref 5–15)
BUN: 31 mg/dL — ABNORMAL HIGH (ref 6–20)
CO2: 26 mmol/L (ref 22–32)
Calcium: 8.9 mg/dL (ref 8.9–10.3)
Chloride: 100 mmol/L (ref 98–111)
Creatinine, Ser: 0.88 mg/dL (ref 0.61–1.24)
GFR, Estimated: >60 mL/min (ref >60)
Glucose, Bld: 108 mg/dL — ABNORMAL HIGH (ref 70–99)
Potassium: 4.1 mmol/L (ref 3.5–5.1)
Sodium: 138 mmol/L (ref 135–145)
Total Bilirubin: 0.2 mg/dL — ABNORMAL LOW (ref 0.3–1.2)
Total Protein: 7.1 g/dL (ref 6.5–8.1)

## 2020-08-26 LAB — RAPID URINE DRUG SCREEN, HOSP PERFORMED
Amphetamines: NOT DETECTED
Barbiturates: NOT DETECTED
Benzodiazepines: NOT DETECTED
Cocaine: POSITIVE — AB
Opiates: POSITIVE — AB
Tetrahydrocannabinol: POSITIVE — AB

## 2020-08-26 LAB — PHOSPHORUS: Phosphorus: 4 mg/dL (ref 2.5–4.6)

## 2020-08-26 LAB — URIC ACID: Uric Acid, Serum: 9.2 mg/dL — ABNORMAL HIGH (ref 3.7–8.6)

## 2020-08-26 LAB — MAGNESIUM: Magnesium: 2.1 mg/dL (ref 1.7–2.4)

## 2020-08-26 LAB — LACTATE DEHYDROGENASE: LDH: 240 U/L — ABNORMAL HIGH (ref 98–192)

## 2020-08-26 MED ORDER — ALLOPURINOL 300 MG PO TABS
300.0000 mg | ORAL_TABLET | Freq: Every day | ORAL | 3 refills | Status: DC
Start: 2020-08-26 — End: 2020-09-30

## 2020-08-26 NOTE — Patient Instructions (Addendum)
[place] at [facility] Discharge Instructions  You were seen today by Dr. Delton Coombes. 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 Keep your appointment with Dr. Smith Robert. Drink high-protein shakes and/or high-protein Boost or Ensure to increase your blood albumin level. Dr. Delton Coombes will see you back in 1 week for labs and follow up.   Thank you for choosing [place] at [facility] to provide your oncology and hematology care.  To afford each patient quality time with our provider, please arrive at least 15 minutes before your scheduled appointment time.   If you have a lab appointment with the [facility] please come in thru the Main Entrance and check in at the main information desk  You need to re-schedule your appointment should you arrive 10 or more minutes late.  We strive to give you quality time with our providers, and arriving late affects you and other patients whose appointments are after yours.  Also, if you no show three or more times for appointments you may be dismissed from the clinic at the providers discretion.     Again, thank you for choosing [facility].  Our hope is that these requests will decrease the amount of time that you wait before being seen by our physicians.       _____________________________________________________________  Should you have questions after your visit to [facility], please contact our office at [phone] between the hours of 8:00 a.m. and 4:30 p.m.  Voicemails left after 4:00 p.m. will not be returned until the following business day.  For prescription refill requests, have your pharmacy contact our office and allow 72 hours.    Cancer Center Support Programs:   > Cancer Support Group  2nd Tuesday of the month 1pm-2pm, Journey Room

## 2020-08-26 NOTE — Progress Notes (Signed)
Richard Cox presents today for portacath flush and labs. VSS.  Right upper chest port accessed, labs drawn, and port flushed per protocol. See MAR and IV assessment for details. Port deaccessed and bandaid applied. Site clean and intact. Patient taken back to waiting room until MD visit.

## 2020-08-26 NOTE — Progress Notes (Signed)
Richard Cox, Richard Cox   CLINIC:  Medical Oncology/Hematology  PCP:  Richard Spar, MD [address] / [place] [address]  [phone]  REASON FOR VISIT:  Follow-up for high-grade B-cell lymphoma  PRIOR THERAPY: None  CURRENT THERAPY: Under work-up  INTERVAL HISTORY:  Richard Cox, a 39 y.o. male, returns for routine follow-up for his high-grade B-cell lymphoma. Richard Cox was last seen on 08/21/2020.  Today he reports feeling well. He has not had any pain in the past 3-4 days since he started taking MS Contin BID. He also started taking prednisone on 10/15. He is not currently taking allopurinol. His appetite and sleep have improved greatly.  He will see Dr. Smith Robert on 10/20.   REVIEW OF SYSTEMS:  Review of Systems  Constitutional: Positive for appetite change (75%) and fatigue (85%).  Gastrointestinal: Positive for nausea.  Genitourinary: Positive for difficulty urinating (hard to start and continue).   All other systems reviewed and are negative.   PAST MEDICAL/SURGICAL HISTORY:  Past Medical History:  Diagnosis Date  . CHF (congestive heart failure) ([place])   . Essential hypertension   . HIV infection ([place])   . Noncompliance with medication regimen   . Secondary cardiomyopathy [facility])    Past Surgical History:  Procedure Laterality Date  . IR IMAGING GUIDED PORT INSERTION  08/23/2020   Right  . NO PAST SURGERIES      SOCIAL HISTORY:  Social History   Socioeconomic History  . Marital status: Single    Spouse name: Not on file  . Number of children: 0  . Years of education: 56  . Highest education level: Not on file  Occupational History  . Not on file  Tobacco Use  . Smoking status: Light Tobacco Smoker    Packs/day: 0.25    Types: Cigarettes  . Smokeless tobacco: Never Used  Vaping Use  . Vaping Use: Never used  Substance and Sexual Activity  . Alcohol use: Not Currently  . Drug use: Not Currently     Types: Marijuana    Comment: x 1 week  . Sexual activity: Not Currently    Comment: pt given condoms  Other Topics Concern  . Not on file  Social History Narrative  . Not on file   Social Determinants of Health   Financial Resource Strain:   . Difficulty of Paying Living Expenses: Not on file  Food Insecurity:   . Worried About Charity fundraiser in the Last Year: Not on file  . Ran Out of Food in the Last Year: Not on file  Transportation Needs:   . Lack of Transportation (Medical): Not on file  . Lack of Transportation (Non-Medical): Not on file  Physical Activity:   . Days of Exercise per Week: Not on file  . Minutes of Exercise per Session: Not on file  Stress:   . Feeling of Stress : Not on file  Social Connections:   . Frequency of Communication with Friends and Family: Not on file  . Frequency of Social Gatherings with Friends and Family: Not on file  . Attends Religious Services: Not on file  . Active Member of Clubs or Organizations: Not on file  . Attends Archivist Meetings: Not on file  . Marital Status: Not on file  Intimate Partner Violence:   . Fear of Current or Ex-Partner: Not on file  . Emotionally Abused: Not on file  . Physically Abused:  Not on file  . Sexually Abused: Not on file    FAMILY HISTORY:  Family History  Problem Relation Age of Onset  . Diabetes Mellitus II Mother   . Cancer Mother        multiple myeloma  . Cancer Father        multiple myeloma  . Congestive Heart Failure Brother   . Heart Problems Brother   . Lupus Brother     CURRENT MEDICATIONS:  Current Outpatient Medications  Medication Sig Dispense Refill  . acetaminophen (TYLENOL) 500 MG tablet Take 1,000 mg by mouth every 6 (six) hours as needed for moderate pain or headache.    . bictegravir-emtricitabine-tenofovir AF (BIKTARVY) 50-200-25 MG TABS tablet Take 1 tablet by mouth daily. 30 tablet 5  . carvedilol (COREG) 6.25 MG tablet Take 1 tablet (6.25 mg  total) by mouth 2 (two) times daily with a meal. For BP and Heart (Patient taking differently: Take 6.25 mg by mouth 2 (two) times daily with a meal. ) 60 tablet 11  . docusate sodium (COLACE) 100 MG capsule Take 100 mg by mouth daily as needed for mild constipation.    . furosemide (LASIX) 40 MG tablet Take 1 tablet (40 mg total) by mouth daily. 30 tablet 3  . hydrALAZINE (APRESOLINE) 50 MG tablet Take 50 mg by mouth 3 (three) times daily.    . isosorbide mononitrate (IMDUR) 30 MG 24 hr tablet Take 30 mg by mouth daily.    Marland Kitchen losartan (COZAAR) 50 MG tablet Take 1 tablet (50 mg total) by mouth daily. 90 tablet 3  . morphine (MS CONTIN) 30 MG 12 hr tablet Take 1 tablet (30 mg total) by mouth every 12 (twelve) hours. 30 tablet 0  . potassium chloride (KLOR-CON) 10 MEQ tablet Take 10 mEq by mouth daily.    . predniSONE (DELTASONE) 20 MG tablet Take 3 tablets (60 mg total) by mouth daily with breakfast. 30 tablet 0  . sulfamethoxazole-trimethoprim (BACTRIM DS) 800-160 MG tablet Take 1 tablet by mouth daily. 30 tablet 5  . allopurinol (ZYLOPRIM) 300 MG tablet Take 1 tablet (300 mg total) by mouth daily. 30 tablet 3  . oxyCODONE (OXY IR/ROXICODONE) 5 MG immediate release tablet Take 2 tablets (10 mg total) by mouth every 6 (six) hours as needed for severe pain. (Patient not taking: Reported on 08/26/2020) 56 tablet 0  . oxyCODONE (OXYCONTIN) 15 mg 12 hr tablet Take 2 tablets (30 mg total) by mouth every 12 (twelve) hours. (Patient not taking: Reported on 08/26/2020) 30 tablet 0  . oxyCODONE-acetaminophen (PERCOCET) 10-325 MG tablet Take 1 tablet by mouth every 4 (four) hours as needed for pain. (Patient not taking: Reported on 08/26/2020) 84 tablet 0   No current facility-administered medications for this visit.    ALLERGIES:  Allergies  Allergen Reactions  . Neosporin [Neomycin-Bacitracin Zn-Polymyx] Rash    PHYSICAL EXAM:  Performance status (ECOG): 0 - Asymptomatic  Vitals:   08/26/20 0932   BP: 140/81  Pulse: 63  Resp: 18  Temp: (!) 97.5 F (36.4 C)  SpO2: 97%   Wt Readings from Last 3 Encounters:  08/26/20 155 lb (70.3 kg)  08/21/20 153 lb 12.8 oz (69.8 kg)  08/20/20 154 lb (69.9 kg)   Physical Exam Vitals reviewed.  Constitutional:      Appearance: Normal appearance.  Cardiovascular:     Rate and Rhythm: Normal rate and regular rhythm.     Pulses: Normal pulses.     Heart sounds: Normal  heart sounds.  Pulmonary:     Effort: Pulmonary effort is normal.     Breath sounds: Normal breath sounds.  Neurological:     General: No focal deficit present.     Mental Status: He is alert and oriented to person, place, and time.  Psychiatric:        Mood and Affect: Mood normal.        Behavior: Behavior normal.     LABORATORY DATA:  I have reviewed the labs as listed.  CBC Latest Ref Rng & Units 08/26/2020 08/15/2020 07/24/2020  WBC 4.0 - 10.5 K/uL 6.8 5.7 5.5  Hemoglobin 13.0 - 17.0 g/dL 10.8(L) 10.9(L) 13.0  Hematocrit 39 - 52 % 32.8(L) 34.9(L) 40.6  Platelets 150 - 400 K/uL 247 215 239   CMP Latest Ref Rng & Units 08/26/2020 07/24/2020 07/22/2020  Glucose 70 - 99 mg/dL 108(H) 97 97  BUN 6 - 20 mg/dL 31(H) 21(H) 17  Creatinine 0.61 - 1.24 mg/dL 0.88 1.36(H) 0.97  Sodium 135 - 145 mmol/L 138 137 140  Potassium 3.5 - 5.1 mmol/L 4.1 3.8 3.9  Chloride 98 - 111 mmol/L 100 101 103  CO2 22 - 32 mmol/L '26 26 25  ' Calcium 8.9 - 10.3 mg/dL 8.9 9.3 9.3  Total Protein 6.5 - 8.1 g/dL 7.1 8.0 7.7  Total Bilirubin 0.3 - 1.2 mg/dL 0.2(L) 0.5 0.5  Alkaline Phos 38 - 126 U/L 78 72 64  AST 15 - 41 U/L 12(L) 23 16  ALT 0 - 44 U/L '12 16 8      ' Component Value Date/Time   RBC 3.74 (L) 08/26/2020 0937   MCV 87.7 08/26/2020 0937   MCH 28.9 08/26/2020 0937   MCHC 32.9 08/26/2020 0937   RDW 15.9 (H) 08/26/2020 0937   LYMPHSABS 0.9 08/26/2020 0937   MONOABS 0.3 08/26/2020 0937   EOSABS 0.0 08/26/2020 0937   BASOSABS 0.0 08/26/2020 0937   Lab Results  Component Value Date    LDH 240 (H) 08/26/2020   LDH 309 (H) 07/24/2020    DIAGNOSTIC IMAGING:  I have independently reviewed the scans and discussed with the patient. NM PET Image Initial (PI) Skull Base To Thigh  Result Date: 07/31/2020 CLINICAL DATA:  Initial treatment strategy for adenopathy. EXAM: NUCLEAR MEDICINE PET SKULL BASE TO THIGH TECHNIQUE: 7.9 mCi F-18 FDG was injected intravenously. Full-ring PET imaging was performed from the skull base to thigh after the radiotracer. CT data was obtained and used for attenuation correction and anatomic localization. Fasting blood glucose: 79 mg/dl COMPARISON:  CT AP 07/24/2020 FINDINGS: Mediastinal blood pool activity: SUV max 1.24 Liver activity: SUV max NA NECK: No hypermetabolic lymph nodes in the neck. Incidental CT findings: none CHEST: Mild diffuse tracer uptake noted within both lobes of thyroid gland. Tiny lymph node in the right axilla exhibits mild tracer uptake with SUV max of 4.21. Likely related to right upper extremity radiotracer injection site. No FDG avid mediastinal or hilar adenopathy. Triangular-shaped soft tissue density within the anterior mediastinum exhibits mild tracer uptake with SUV max of 2.51 and likely reflects thymic hyperplasia. Broad-based pleural base nodule overlying the right middle lobe measures 9 mm with SUV max of 4.24. Small scattered lung nodules (approximately 10) are identified, nonspecific these are all too small to reliably characterize by PET-CT. Example nodule in the superior segment of right lower lobe measures 4 mm, image 63/4. No pleural effusion, airspace consolidation, or atelectasis. Incidental CT findings: Cardiac enlargement. No aortic atherosclerosis or coronary artery calcifications.  ABDOMEN/PELVIS: No abnormal FDG uptake identified within the liver, spleen, adrenal glands, or kidneys. The spleen is enlarged with a cranial caudal dimension of 14.8 cm. No focal areas of abnormal uptake identified within the spleen. Multifocal  FDG avid soft tissue masses are identified within the abdomen or pelvis. Index lesions include: -Intense FDG uptake is associated with the left upper quadrant mass which appears closely associated with the tail of pancreas. This mass measures 13.8 x 12.5 cm with SUV max of 19.35, image 121/4. -Soft tissue mass within the small bowel mesentery (eccentric to the right) measures 8.9 x 6.2 cm with SUV max of 15.57, image 147/4. -Left lower quadrant small bowel mesenteric mass measures 5.1 x 3.7 cm within SUV max of 14.29, image 159/4. -Soft tissue mass within the right side of pelvis measures 10.4 x 4.6 cm with SUV max of 14.10, image 173/4. -Mass within left pelvis measures 9.2 x 4.6 cm within SUV max of 20.7, image 177/4. -Along the right pericolic gutter there is a soft tissue mass measuring 2.8 x 3.2 cm with SUV max of 17.48, image 157/4. -Mass overlying the right hepatic lobe measures 2.9 x 1.8 cm and has an SUV max of 16.19. Incidental CT findings: none SKELETON: No focal hypermetabolic activity to suggest skeletal metastasis. Incidental CT findings: none IMPRESSION: 1. Extensive, multifocal FDG avid soft tissue masses are identified within the abdomen and pelvis. Suspect lymphoproliferative disorder. Correlation with soft tissue sampling recommended. 2. Additional, FDG avid subpleural nodules overlying the right lung are noted also concerning for sites of lymphoproliferative disorder. 3. Splenomegaly. 4. Multiple small scattered lung nodules are identified in both lungs. The appearance of these nodules is nonspecific. These are all too small to reliably characterize by PET-CT. Attention on follow-up imaging advised. 5. Cardiac enlargement. Electronically Signed   By: Kerby Moors M.D.   On: 07/31/2020 09:14   CT Biopsy  Result Date: 08/15/2020 INDICATION: 39 year old male with a history of most likely lymphoma, referred for biopsy EXAM: CT BIOPSY MEDICATIONS: None. ANESTHESIA/SEDATION: Moderate (conscious)  sedation was employed during this procedure. A total of Versed 1.5 mg and Fentanyl 50 mcg was administered intravenously. Moderate Sedation Time: 10 minutes. The patient's level of consciousness and vital signs were monitored continuously by radiology nursing throughout the procedure under my direct supervision. FLUOROSCOPY TIME:  CT COMPLICATIONS: None PROCEDURE: Informed written consent was obtained from the patient after a thorough discussion of the procedural risks, benefits and alternatives. All questions were addressed. Maximal Sterile Barrier Technique was utilized including caps, mask, sterile gowns, sterile gloves, sterile drape, hand hygiene and skin antiseptic. A timeout was performed prior to the initiation of the procedure. Patient positioned supine position on CT gantry table. Scout CT was acquired for planning purposes. We targeted the sub a Paddock/gastrohepatic ligament mass for biopsy, in the subxiphoid region. Once the patient is prepped and draped in the usual sterile fashion, 1% lidocaine was used for local anesthesia. We then advanced trocar needle under CT guidance and multiple 18 gauge core biopsy were acquired. Needle was removed and a final image was stored. Patient tolerated the procedure well and remained hemodynamically stable throughout. No complications were encountered and no significant blood loss. IMPRESSION: Status post CT-guided biopsy of upper abdominal peritoneal mass. Signed, Dulcy Fanny. Dellia Nims, RPVI [facility] Electronically Signed   By: Corrie Mckusick D.O.   On: 08/15/2020 13:08   ECHOCARDIOGRAM COMPLETE  Result Date: 08/22/2020    ECHOCARDIOGRAM REPORT  Patient Name:   DRURY ARDIZZONE Date of Exam: 08/22/2020 Medical Rec #:  [id number]  Height:       73.0 in Accession #:    [id number] Weight:       153.8 lb Date of Birth:  03/02/1981   BSA:          1.924 m Patient Age:    39 years   BP:           148/87 mmHg Patient  Gender: M          HR:           77 bpm. Exam Location:  [facility] Procedure: 2D Echo, Cardiac Doppler and Color Doppler Indications:    Chemotherapy evaluation v87.41 / v58.11  History:        Patient has prior history of Echocardiogram examinations, most                 recent 09/18/2019. CHF; Risk Factors:Hypertension. AIDS (acquired                 immune deficiency syndrome), Alcohol abuse, Polysubstance abuse                 -cocaine use in remission, Secondary cardiomyopathy ([place]) (From                 Hx).  Sonographer:    Alvino Chapel RCS Referring Phys: [phone] [name]  1. Left ventricular ejection fraction, by estimation, is 45 to 50%. The left ventricle has mildly decreased function. The left ventricle demonstrates global hypokinesis. There is severe left ventricular hypertrophy, septum greater than posterior wall. Left ventricular diastolic parameters are indeterminate. Consistent with hypertrophic cardiomyoapthy, although myocardial acoustic appearance suggests possible infiltrative cardiomyopathy.  2. Right ventricular systolic function is normal. The right ventricular size is normal. Moderately increased right ventricular wall thickness. Tricuspid regurgitation signal is inadequate for assessing PA pressure.  3. Left atrial size was moderately dilated.  4. The mitral valve is grossly normal. No evidence of mitral valve regurgitation.  5. The aortic valve is tricuspid. Aortic valve regurgitation is not visualized.  6. The inferior vena cava is normal in size with greater than 50% respiratory variability, suggesting right atrial pressure of 3 mmHg. FINDINGS  Left Ventricle: Left ventricular ejection fraction, by estimation, is 45 to 50%. The left ventricle has mildly decreased function. The left ventricle demonstrates global hypokinesis. The left ventricular internal cavity size was normal in size. There is  severe left ventricular hypertrophy. Left ventricular diastolic  parameters are indeterminate. Right Ventricle: The right ventricular size is normal. Moderately increased right ventricular wall thickness. Right ventricular systolic function is normal. Tricuspid regurgitation signal is inadequate for assessing PA pressure. Left Atrium: Left atrial size was moderately dilated. Right Atrium: Right atrial size was normal in size. Pericardium: There is no evidence of pericardial effusion. Mitral Valve: The mitral valve is grossly normal. No evidence of mitral valve regurgitation. Tricuspid Valve: The tricuspid valve is grossly normal. Tricuspid valve regurgitation is trivial. Aortic Valve: The aortic valve is tricuspid. Aortic valve regurgitation is not visualized. Pulmonic Valve: The pulmonic valve was grossly normal. Pulmonic valve regurgitation is trivial. Aorta: The aortic root is normal in size and structure. Venous: The inferior vena cava is normal in size with greater than 50% respiratory variability, suggesting right atrial pressure of 3 mmHg. IAS/Shunts: No atrial level shunt detected by color flow Doppler.  LEFT VENTRICLE PLAX 2D LVIDd:  4.23 cm  Diastology LVIDs:         3.27 cm  LV e' medial:    4.13 cm/s LV PW:         2.29 cm  LV E/e' medial:  14.8 LV IVS:        3.30 cm  LV e' lateral:   7.40 cm/s LVOT diam:     2.50 cm  LV E/e' lateral: 8.3 LV SV:         126 LV SV Index:   66 LVOT Area:     4.91 cm  RIGHT VENTRICLE RV S prime:     12.60 cm/s TAPSE (M-mode): 2.2 cm LEFT ATRIUM             Index       RIGHT ATRIUM           Index LA diam:        5.10 cm 2.65 cm/m  RA Area:     16.20 cm LA Vol (A2C):   88.5 ml 45.99 ml/m RA Volume:   42.00 ml  21.83 ml/m LA Vol (A4C):   88.4 ml 45.94 ml/m LA Biplane Vol: 90.6 ml 47.08 ml/m  AORTIC VALVE LVOT Vmax:   143.00 cm/s LVOT Vmean:  83.200 cm/s LVOT VTI:    0.257 m  AORTA Ao Root diam: 3.40 cm MITRAL VALVE MV Area (PHT): 3.51 cm    SHUNTS MV Decel Time: 216 msec    Systemic VTI:  0.26 m MV E velocity: 61.30 cm/s   Systemic Diam: 2.50 cm MV A velocity: 58.50 cm/s MV E/A ratio:  1.05 Rozann Lesches MD Electronically signed by Rozann Lesches MD Signature Date/Time: 08/22/2020/12:19:50 PM    Final    IR IMAGING GUIDED PORT INSERTION  Result Date: 08/23/2020 INDICATION: 39 year old male with history of lymphoma requiring central venous access for chemotherapy. EXAM: IMPLANTED PORT A CATH PLACEMENT WITH ULTRASOUND AND FLUOROSCOPIC GUIDANCE COMPARISON:  None. MEDICATIONS: Ancef 2 gm IV; The antibiotic was administered within an appropriate time interval prior to skin puncture. ANESTHESIA/SEDATION: Moderate (conscious) sedation was employed during this procedure. A total of Versed 2 mg and Fentanyl 100 mcg was administered intravenously. Moderate Sedation Time: 25 minutes. The patient's level of consciousness and vital signs were monitored continuously by radiology nursing throughout the procedure under my direct supervision. CONTRAST:  None FLUOROSCOPY TIME:  0 minutes, 12 seconds (1 mGy) COMPLICATIONS: None immediate. PROCEDURE: The procedure, risks, benefits, and alternatives were explained to the patient. Questions regarding the procedure were encouraged and answered. The patient understands and consents to the procedure. The right neck and chest were prepped with chlorhexidine in a sterile fashion, and a sterile drape was applied covering the operative field. Maximum barrier sterile technique with sterile gowns and gloves were used for the procedure. A timeout was performed prior to the initiation of the procedure. Ultrasound was used to examine the jugular vein which was compressible and free of internal echoes. A skin marker was used to demarcate the planned venotomy and port pocket incision sites. Local anesthesia was provided to these sites and the subcutaneous tunnel track with 1% lidocaine with 1:100,000 epinephrine. A small incision was created at the jugular access site and blunt dissection was performed of the  subcutaneous tissues. Under real time ultrasound guidance, the jugular vein was accessed with a 21 ga micropuncture needle and an 0.018" wire was inserted to the superior vena cava. A 5 Fr micopuncture set was then used, through which a 0.035" [employer]  wire was passed under fluoroscopic guidance into the inferior vena cava. An 8 Fr dilator was then placed over the wire. A subcutaneous port pocket was then created along the upper chest wall utilizing a combination of sharp and blunt dissection. The pocket was irrigated with sterile saline, packed with gauze, and observed for hemorrhage. A single lumen power injectable port was chosen for placement. The 8 Fr catheter was tunneled from the port pocket site to the venotomy incision. The port was placed in the pocket. The external catheter was trimmed to appropriate length. The dilator was exchanged for an 8 Fr peel-away sheath under fluoroscopic guidance. The catheter was then placed through the sheath and the sheath was removed. Final catheter positioning was confirmed and documented with a fluoroscopic spot radiograph. The port was accessed with a Huber needle, aspirated, and flushed with heparinized saline. The deep dermal layer of the port pocket incision was closed with interrupted 3-0 Vicryl suture. The skin was opposed with a running subcuticular 4-0 Monocryl suture. Dermabond was then placed over the port pocket and neck incisions. The patient tolerated the procedure well without immediate post procedural complication. FINDINGS: After catheter placement, the tip lies within the cavoatrial junction the catheter aspirates and flushes normally and is ready for immediate use. IMPRESSION: Successful placement of a power injectable Port-A-Cath via the right internal jugular vein. The catheter is ready for immediate use. Ruthann Cancer, MD [facility] Electronically Signed   By: Ruthann Cancer MD   On: 08/23/2020  10:09     ASSESSMENT:  1.  B-cell high-grade lymphoma: -Presentation to the ER with left upper quadrant and mid abdominal pain for 2 weeks. -10 pound weight loss in the last 2 weeks due to decreased appetite. Denies any fevers or night sweats. -CT AP with contrast on 07/24/2020 shows 13.8 11.2 cm mass in the epigastric region, lobulated large mass in the mesenteric region, 9.3 x 5.1 cm mass in the inferior pelvis, 9.7 x 4.1 cm mass in the superior pelvis. Epigastric mass seems to abut posterior spleen. -LDH was elevated at 309, uric acid 10.2. -PET scan on 07/31/2020 showed extensive multifocal soft tissue masses within the abdomen and pelvis, most of them between SUV 15 and 20. FDG avid subpleural nodules overlying the right lung. Splenomegaly. Multiple scattered lung nodules, nonspecific. -Biopsy of the soft tissue mass in the upper abdomen on 08/15/2020 consistent with high-grade B-cell lymphoma.  IHC positive for CD20, CD10, BCL6, negative for BCL-2, CD3, CD5, CD30, mum 1 and EBV.  Ki-67 is 80-90%.  Differential diagnosis includes Burkitt's lymphoma another large B-cell lymphoma's.  2. Social/family history: -He quit smoking last year. -Family history significant for mother and father who had multiple myeloma. Grandfather had colon cancer.  3. 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  4. CHF: -Echocardiogram on 08/22/2020 with EF 45-50%. -Echocardiogram on 09/18/2019 shows EF 40-45%. -He is on Coreg 6.25 mg twice daily, Imdur 30 mg daily. Lasix 40 mg is an expired medication list.   PLAN:  1.  High-grade B cell lymphoma: -FISH panel for high risk lymphoma is still pending as of today. -Echo on 08/22/2020 shows ejection fraction 45 to 50%.  He has port placed by IR. -I have reached out to Dr. Lonia Blood at [facility], who has kindly agreed to see this patient on Wednesday for assessment for clinical trial. -If he does not qualify for the clinical trial, we will  give treatment with  R-EPOCH next week. -We discussed about the chemotherapy regimen in detail. -We started him on prednisone 60 mg daily on 08/23/2020. -Urine was positive for cocaine and marijuana.  I have recommended to stop using cocaine. -RTC 1 week.  2. Abdominal pain: -Continue MS Contin 30 mg every 12 hours. -Continue oxycodone 10 mg as needed for breakthrough.  He is not requiring it. -His pain has improved since Friday.  3. Nausea: -Continue Zofran ODT 4 mg every 8 hours as needed.  4. Hyperuricemia: -Uric acid increased to 9.2.  He stopped taking allopurinol. -He will start back on allopurinol 300 mg daily.  Orders placed this encounter:  No orders of the defined types were placed in this encounter.    Derek Jack, MD [place] [phone]   I, Milinda Antis, am acting as a scribe for Dr. Sanda Linger.  I, Derek Jack MD, have reviewed the above documentation for accuracy and completeness, and I agree with the above.

## 2020-08-27 ENCOUNTER — Encounter (HOSPITAL_COMMUNITY): Payer: Self-pay

## 2020-08-27 ENCOUNTER — Ambulatory Visit: Payer: Self-pay | Admitting: General Surgery

## 2020-08-27 ENCOUNTER — Other Ambulatory Visit (HOSPITAL_COMMUNITY): Payer: 59

## 2020-08-27 ENCOUNTER — Ambulatory Visit (HOSPITAL_COMMUNITY): Payer: 59 | Admitting: Hematology

## 2020-08-28 ENCOUNTER — Other Ambulatory Visit (HOSPITAL_COMMUNITY): Payer: Self-pay

## 2020-08-28 DIAGNOSIS — C8590 Non-Hodgkin lymphoma, unspecified, unspecified site: Secondary | ICD-10-CM

## 2020-08-28 HISTORY — DX: Non-Hodgkin lymphoma, unspecified, unspecified site: C85.90

## 2020-08-28 MED ORDER — LIDOCAINE-PRILOCAINE 2.5-2.5 % EX CREA: 1.0000 "application " | TOPICAL_CREAM | CUTANEOUS | 0 refills | Status: DC | PRN

## 2020-08-30 ENCOUNTER — Encounter (HOSPITAL_COMMUNITY): Payer: Self-pay | Admitting: *Deleted

## 2020-09-02 ENCOUNTER — Inpatient Hospital Stay (HOSPITAL_COMMUNITY): Payer: 59

## 2020-09-02 ENCOUNTER — Inpatient Hospital Stay (HOSPITAL_BASED_OUTPATIENT_CLINIC_OR_DEPARTMENT_OTHER): Payer: 59 | Admitting: Hematology

## 2020-09-02 ENCOUNTER — Other Ambulatory Visit (HOSPITAL_COMMUNITY): Payer: Self-pay | Admitting: *Deleted

## 2020-09-02 ENCOUNTER — Other Ambulatory Visit: Payer: Self-pay

## 2020-09-02 ENCOUNTER — Encounter (HOSPITAL_COMMUNITY): Payer: Self-pay

## 2020-09-02 VITALS — BP 121/68 | HR 67 | Temp 96.8°F | Resp 18

## 2020-09-02 VITALS — BP 143/80 | HR 78 | Temp 97.2°F | Resp 18 | Wt 157.9 lb

## 2020-09-02 DIAGNOSIS — C8510 Unspecified B-cell lymphoma, unspecified site: Secondary | ICD-10-CM

## 2020-09-02 DIAGNOSIS — Z5111 Encounter for antineoplastic chemotherapy: Secondary | ICD-10-CM | POA: Diagnosis not present

## 2020-09-02 LAB — CBC WITH DIFFERENTIAL/PLATELET
Abs Immature Granulocytes: 0.03 10*3/uL (ref 0.00–0.07)
Basophils Absolute: 0 10*3/uL (ref 0.0–0.1)
Basophils Relative: 0 %
Eosinophils Absolute: 0 10*3/uL (ref 0.0–0.5)
Eosinophils Relative: 0 %
HCT: 35.5 % — ABNORMAL LOW (ref 39.0–52.0)
Hemoglobin: 11.9 g/dL — ABNORMAL LOW (ref 13.0–17.0)
Immature Granulocytes: 1 %
Lymphocytes Relative: 22 %
Lymphs Abs: 1.1 10*3/uL (ref 0.7–4.0)
MCH: 29 pg (ref 26.0–34.0)
MCHC: 33.5 g/dL (ref 30.0–36.0)
MCV: 86.6 fL (ref 80.0–100.0)
Monocytes Absolute: 0.4 10*3/uL (ref 0.1–1.0)
Monocytes Relative: 8 %
Neutro Abs: 3.5 10*3/uL (ref 1.7–7.7)
Neutrophils Relative %: 69 %
Platelets: 203 10*3/uL (ref 150–400)
RBC: 4.1 MIL/uL — ABNORMAL LOW (ref 4.22–5.81)
RDW: 16.5 % — ABNORMAL HIGH (ref 11.5–15.5)
WBC: 5.1 10*3/uL (ref 4.0–10.5)
nRBC: 0 % (ref 0.0–0.2)

## 2020-09-02 LAB — COMPREHENSIVE METABOLIC PANEL
ALT: 22 U/L (ref 0–44)
AST: 22 U/L (ref 15–41)
Albumin: 3.4 g/dL — ABNORMAL LOW (ref 3.5–5.0)
Alkaline Phosphatase: 70 U/L (ref 38–126)
Anion gap: 11 (ref 5–15)
BUN: 21 mg/dL — ABNORMAL HIGH (ref 6–20)
CO2: 26 mmol/L (ref 22–32)
Calcium: 9.1 mg/dL (ref 8.9–10.3)
Chloride: 99 mmol/L (ref 98–111)
Creatinine, Ser: 1.03 mg/dL (ref 0.61–1.24)
GFR, Estimated: >60 mL/min (ref >60)
Glucose, Bld: 89 mg/dL (ref 70–99)
Potassium: 3.2 mmol/L — ABNORMAL LOW (ref 3.5–5.1)
Sodium: 136 mmol/L (ref 135–145)
Total Bilirubin: 0.3 mg/dL (ref 0.3–1.2)
Total Protein: 6.9 g/dL (ref 6.5–8.1)

## 2020-09-02 LAB — LACTATE DEHYDROGENASE: LDH: 162 U/L (ref 98–192)

## 2020-09-02 LAB — PHOSPHORUS: Phosphorus: 2.6 mg/dL (ref 2.5–4.6)

## 2020-09-02 LAB — URIC ACID: Uric Acid, Serum: 4 mg/dL (ref 3.7–8.6)

## 2020-09-02 MED ORDER — METHYLPREDNISOLONE SODIUM SUCC 125 MG IJ SOLR
125.0000 mg | Freq: Once | INTRAMUSCULAR | Status: AC
  Administered 2020-09-02: 125 mg via INTRAVENOUS

## 2020-09-02 MED ORDER — SODIUM CHLORIDE 0.9% FLUSH: 10.0000 mL | INTRAVENOUS | Status: DC | PRN

## 2020-09-02 MED ORDER — MEPERIDINE HCL 50 MG/ML IJ SOLN
50.0000 mg | Freq: Once | INTRAMUSCULAR | Status: AC
  Administered 2020-09-02: 50 mg via INTRAVENOUS
  Filled 2020-09-02: qty 1

## 2020-09-02 MED ORDER — POTASSIUM CHLORIDE CRYS ER 20 MEQ PO TBCR
EXTENDED_RELEASE_TABLET | ORAL | Status: AC
  Filled 2020-09-02: qty 2

## 2020-09-02 MED ORDER — PREDNISONE 20 MG PO TABS: ORAL_TABLET | ORAL | 0 refills | Status: DC

## 2020-09-02 MED ORDER — SODIUM CHLORIDE 0.9 % IV SOLN
Freq: Once | INTRAVENOUS | Status: AC
  Administered 2020-09-02: 8 mg via INTRAVENOUS
  Filled 2020-09-02: qty 4

## 2020-09-02 MED ORDER — SODIUM CHLORIDE 0.9 % IV SOLN
375.0000 mg/m2 | Freq: Once | INTRAVENOUS | Status: AC
  Administered 2020-09-02: 700 mg via INTRAVENOUS
  Filled 2020-09-02: qty 20

## 2020-09-02 MED ORDER — ACETAMINOPHEN 325 MG PO TABS
650.0000 mg | ORAL_TABLET | Freq: Once | ORAL | Status: AC
  Administered 2020-09-02: 650 mg via ORAL
  Filled 2020-09-02: qty 2

## 2020-09-02 MED ORDER — POTASSIUM CHLORIDE CRYS ER 20 MEQ PO TBCR
40.0000 meq | EXTENDED_RELEASE_TABLET | Freq: Once | ORAL | Status: AC
  Administered 2020-09-02: 40 meq via ORAL

## 2020-09-02 MED ORDER — VINCRISTINE SULFATE CHEMO INJECTION 1 MG/ML
Freq: Once | INTRAVENOUS | Status: AC
  Filled 2020-09-02: qty 10

## 2020-09-02 MED ORDER — OXYCODONE HCL 5 MG PO TABS: 10.0000 mg | ORAL_TABLET | Freq: Two times a day (BID) | ORAL | 0 refills | Status: DC | PRN

## 2020-09-02 MED ORDER — PREDNISONE 50 MG PO TABS
100.0000 mg | ORAL_TABLET | Freq: Once | ORAL | Status: AC
  Administered 2020-09-02: 100 mg via ORAL
  Filled 2020-09-02: qty 2

## 2020-09-02 MED ORDER — SODIUM CHLORIDE 0.9 % IV SOLN
6.0000 mg | Freq: Once | INTRAVENOUS | Status: AC
  Administered 2020-09-02: 6 mg via INTRAVENOUS
  Filled 2020-09-02: qty 4

## 2020-09-02 MED ORDER — HEPARIN SOD (PORK) LOCK FLUSH 100 UNIT/ML IV SOLN: 500.0000 [IU] | Freq: Once | INTRAVENOUS | Status: DC | PRN

## 2020-09-02 MED ORDER — SODIUM CHLORIDE 0.9 % IV SOLN: INTRAVENOUS | Status: DC

## 2020-09-02 MED ORDER — DIPHENHYDRAMINE HCL 50 MG/ML IJ SOLN
50.0000 mg | Freq: Once | INTRAMUSCULAR | Status: AC
  Administered 2020-09-02: 50 mg via INTRAVENOUS
  Filled 2020-09-02: qty 1

## 2020-09-02 MED ORDER — FAMOTIDINE IN NACL 20-0.9 MG/50ML-% IV SOLN
20.0000 mg | Freq: Once | INTRAVENOUS | Status: AC
  Administered 2020-09-02: 20 mg via INTRAVENOUS
  Filled 2020-09-02: qty 50

## 2020-09-02 MED FILL — SULFAMETHOXAZOLE-TMP DS TAB: 800-160 | 30 days supply | Qty: 30 | Fill #0

## 2020-09-02 MED FILL — BIKTARVY 50-200-25 MG TABS: 50-200-25 | 30 days supply | Qty: 30 | Fill #2

## 2020-09-02 NOTE — Progress Notes (Signed)
Patient was assessed by Dr. Delton Coombes and labs have been reviewed.  EF 40-45%, We will repeat echocardiogram in 3 weeks prior to next cycle of chemotherapy. Potassium 3.2, orders received for potassium 40 mEq by mouth times once today.  Patient is okay to proceed with treatment today. Primary RN and pharmacy aware.

## 2020-09-02 NOTE — Patient Instructions (Signed)
[facility] at [facility] Discharge Instructions  Labs drawn from portacath today   Thank you for choosing [facility] at [facility] to provide your oncology and hematology care.  To afford each patient quality time with our provider, please arrive at least 15 minutes before your scheduled appointment time.   If you have a lab appointment with the Cancer Center please come in thru the Main Entrance and check in at the main information desk.  You need to re-schedule your appointment should you arrive 10 or more minutes late.  We strive to give you quality time with our providers, and arriving late affects you and other patients whose appointments are after yours.  Also, if you no show three or more times for appointments you may be dismissed from the clinic at the providers discretion.     Again, thank you for choosing [facility].  Our hope is that these requests will decrease the amount of time that you wait before being seen by our physicians.       _____________________________________________________________  Should you have questions after your visit to [facility], please contact our office at [phone] and follow the prompts.  Our office hours are 8:00 a.m. and 4:30 p.m. Monday - Friday.  Please note that voicemails left after 4:00 p.m. may not be returned until the following business day.  We are closed weekends and major holidays.  You do have access to a nurse 24-7, just call the main number to the clinic [phone] and do not press any options, hold on the line and a nurse will answer the phone.    For prescription refill requests, have your pharmacy contact our office and allow 72 hours.    Due to Covid, you will need to wear a mask upon entering the hospital. If you do not have a mask, a mask will be given to you at the Main Entrance upon arrival. For doctor visits, patients may have 1 support person age 18  or older with them. For treatment visits, patients can not have anyone with them due to social distancing guidelines and our immunocompromised population.     

## 2020-09-02 NOTE — Progress Notes (Signed)
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

## 2020-09-02 NOTE — Patient Instructions (Signed)
[facility] Discharge Instructions for Patients Receiving Chemotherapy  Today you received the following chemotherapy agents   To help prevent nausea and vomiting after your treatment, we encourage you to take your nausea medication   If you develop nausea and vomiting that is not controlled by your nausea medication, call the clinic.   BELOW ARE SYMPTOMS THAT SHOULD BE REPORTED IMMEDIATELY:  *FEVER GREATER THAN 100.5 F  *CHILLS WITH OR WITHOUT FEVER  NAUSEA AND VOMITING THAT IS NOT CONTROLLED WITH YOUR NAUSEA MEDICATION  *UNUSUAL SHORTNESS OF BREATH  *UNUSUAL BRUISING OR BLEEDING  TENDERNESS IN MOUTH AND THROAT WITH OR WITHOUT PRESENCE OF ULCERS  *URINARY PROBLEMS  *BOWEL PROBLEMS  UNUSUAL RASH Items with * indicate a potential emergency and should be followed up as soon as possible.  Feel free to call the clinic should you have any questions or concerns. The clinic phone number is [phone].  Please show the CHEMO ALERT CARD at check-in to the Emergency Department and triage nurse.   

## 2020-09-02 NOTE — Progress Notes (Signed)
.   Pharmacist Chemotherapy Monitoring - Initial Assessment    Anticipated start date: 09/02/20   Regimen:  . Are orders appropriate based on the patient's diagnosis, regimen, and cycle? Yes . Does the plan date match the patient's scheduled date? Yes . Is the sequencing of drugs appropriate? Yes . Are the premedications appropriate for the patient's regimen? Yes . Prior Authorization for treatment is: Approved o If applicable, is the correct biosimilar selected based on the patient's insurance? yes  Organ Function and Labs: Marland Kitchen Are dose adjustments needed based on the patient's renal function, hepatic function, or hematologic function? No . Are appropriate labs ordered prior to the start of patient's treatment? Yes . Other organ system assessment, if indicated: anthracyclines: Echo/ MUGA . The following baseline labs, if indicated, have been ordered: rituximab: baseline Hepatitis B labs  Dose Assessment: . Are the drug doses appropriate? Yes . Are the following correct: o Drug concentrations Yes o IV fluid compatible with drug Yes o Administration routes Yes o Timing of therapy Yes . If applicable, does the patient have documented access for treatment and/or plans for port-a-cath placement? yes . If applicable, have lifetime cumulative doses been properly documented and assessed? yes Lifetime Dose Tracking  No doses have been documented on this patient for the following tracked chemicals: Doxorubicin, Epirubicin, Idarubicin, Daunorubicin, Mitoxantrone, Bleomycin, Oxaliplatin, Carboplatin, Liposomal Doxorubicin  o   Toxicity Monitoring/Prevention: . The patient has the following take home antiemetics prescribed: Ondansetron . The patient has the following take home medications prescribed: Tumor Lysis Syndrome prophylaxis . Medication allergies and previous infusion related reactions, if applicable, have been reviewed and addressed. Yes . The patient's current medication list has been  assessed for drug-drug interactions with their chemotherapy regimen. no significant drug-drug interactions were identified on review.  Order Review: . Are the treatment plan orders signed? No . Is the patient scheduled to see a provider prior to their treatment? Yes  I verify that I have reviewed each item in the above checklist and answered each question accordingly.  Wynona Neat 09/02/2020 8:29 AM

## 2020-09-02 NOTE — Progress Notes (Signed)
Pt here for D1C1 Of R-EPOCH.  Consent signed for treatment. We will repeat ECHO before next cycle. We are okay to treat today with the decreased EF. K 3.2.  Uric acid 4.  K 40 meq by mouth PO today.  Okay for treatment today.  Prednisone 100 mg PO given today.  1345 pt complaining of scratchy throat, numbness in lips and chills and shaking.  Pt at end of 200 mg of Rituxan. VS stable.   Harding Dr Raliegh Ip at bedside.  Orders received for demerol 50 mg and solumedrol 125 mg.   1353 solumedrol given 1357 demerol 50 mg given. 1426 VS stable.  Pt is feeling better.  No numbness or tingling in throat. No scratchiness. Rituxan restarted at 200 mg/hr. Increased every 30 minutes thereafter and tolerated well.   Ambulatory pump teaching completed.   Vitals stable prior to discharge.  Discharged in stable condition with ambulatory pump in stable condition.

## 2020-09-02 NOTE — Progress Notes (Signed)
Richard Cox, [place] [address]   CLINIC:  Medical Oncology/Hematology  PCP:  Richard Spar, MD [address] 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[place])   09/02/2020 -  Chemotherapy   The patient had DOXOrubicin (ADRIAMYCIN) 20 mg, etoposide (VEPESID) 96 mg, vinCRIStine (ONCOVIN) 0.8 mg in sodium chloride 0.9 % 500 mL chemo infusion, , Intravenous, Once, 1 of 4 cycles ondansetron (ZOFRAN) 8 mg, dexamethasone (DECADRON) 10 mg in sodium chloride 0.9 % 50 mL IVPB, , Intravenous,  Once, 1 of 4 cycles cyclophosphamide (CYTOXAN) 1,420 mg in sodium chloride 0.9 % 250 mL chemo infusion, 750 mg/m2 = 1,420 mg, Intravenous,  Once, 1 of 4 cycles riTUXimab-pvvr (RUXIENCE) 700 mg in sodium chloride 0.9 % 250 mL (2.1875 mg/mL) infusion, 375 mg/m2 = 700 mg, Intravenous,  Once, 1 of 4 cycles  for chemotherapy treatment.      CANCER STAGING: Cancer Staging No matching staging information was found for the patient.  INTERVAL HISTORY:  Richard Cox, a 39 y.o. male, returns for routine follow-up and consideration for first cycle of chemotherapy. Richard Cox was last seen on 08/26/2020.  Due for initiating cycle #1 of R-EPOCH today.   Overall, Richard Cox tells me Richard Cox has been feeling pretty well. Richard Cox reports not having a BM since 10/22. Richard Cox continues walking every morning and his appetite is good; Richard Cox has stool softeners but is not taking them and Richard Cox is passing gas. His abdominal pain is better controlled; Richard Cox reports not taking morphine since 10/21 and is out of oxycodone. Richard Cox reports that the morphine made him drowsy. Richard Cox is taking allopurinol.  Richard Cox spoke to Dr. Lonia Blood at [facility] on  10/20.  Overall, Richard Cox feels ready for first cycle of chemo today.    REVIEW OF SYSTEMS:  Review of Systems  Constitutional: Positive for appetite change (75%) and fatigue (75%).  Gastrointestinal: Positive for abdominal pain (5/10 pain) and constipation (w/o obstipation).  All other systems reviewed and are negative.   PAST MEDICAL/SURGICAL HISTORY:  Past Medical History:  Diagnosis Date  . CHF (congestive heart failure) ([place])   . Essential hypertension   . HIV infection ([place])   . Noncompliance with medication regimen   . 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 IR IMAGING GUIDED PORT INSERTION  08/23/2020   Right  . NO PAST SURGERIES      SOCIAL HISTORY:  Social History   Socioeconomic History  . Marital status: Single    Spouse name: Not on file  . Number of children: 0  . Years of education: 66  . Highest education level: Not on file  Occupational History  . Not on file  Tobacco Use  . Smoking status: Light Tobacco Smoker    Packs/day: 0.25    Types: Cigarettes  . Smokeless tobacco: Never Used  Vaping Use  . Vaping Use: Never used  Substance and Sexual Activity  . Alcohol use: Not Currently  . Drug use: Not Currently    Types: Marijuana    Comment: x 1 week  . Sexual activity: Not Currently    Comment: pt given condoms  Other Topics Concern  .  Not on file  Social History Narrative  . Not on file   Social Determinants of Health   Financial Resource Strain:   . Difficulty of Paying Living Expenses: Not on file  Food Insecurity:   . Worried About Charity fundraiser in the Last Year: Not on file  . Ran Out of Food in the Last Year: Not on file  Transportation Needs:   . Lack of Transportation (Medical): Not on file  . Lack of Transportation (Non-Medical): Not on file  Physical Activity:   . Days of Exercise per Week: Not on file  . Minutes of Exercise per Session: Not on file  Stress:   . Feeling of  Stress : Not on file  Social Connections:   . Frequency of Communication with Friends and Family: Not on file  . Frequency of Social Gatherings with Friends and Family: Not on file  . Attends Religious Services: Not on file  . Active Member of Clubs or Organizations: Not on file  . Attends Archivist Meetings: Not on file  . Marital Status: Not on file  Intimate Partner Violence:   . Fear of Current or Ex-Partner: Not on file  . Emotionally Abused: Not on file  . Physically Abused: Not on file  . Sexually Abused: Not on file    FAMILY HISTORY:  Family History  Problem Relation Age of Onset  . Diabetes Mellitus II Mother   . Cancer Mother        multiple myeloma  . Cancer Father        multiple myeloma  . Congestive Heart Failure Brother   . Heart Problems Brother   . Lupus Brother     CURRENT MEDICATIONS:  Current Outpatient Medications  Medication Sig Dispense Refill  . acetaminophen (TYLENOL) 500 MG tablet Take 1,000 mg by mouth every 6 (six) hours as needed for moderate pain or headache.    . allopurinol (ZYLOPRIM) 300 MG tablet Take 1 tablet (300 mg total) by mouth daily. 30 tablet 3  . bictegravir-emtricitabine-tenofovir AF (BIKTARVY) 50-200-25 MG TABS tablet Take 1 tablet by mouth daily. 30 tablet 5  . carvedilol (COREG) 6.25 MG tablet Take 1 tablet (6.25 mg total) by mouth 2 (two) times daily with a meal. For BP and Heart (Patient taking differently: Take 6.25 mg by mouth 2 (two) times daily with a meal. ) 60 tablet 11  . dicyclomine (BENTYL) 10 MG capsule     . docusate sodium (COLACE) 100 MG capsule Take 100 mg by mouth daily as needed for mild constipation.    . furosemide (LASIX) 40 MG tablet Take 1 tablet (40 mg total) by mouth daily. 30 tablet 3  . hydrALAZINE (APRESOLINE) 50 MG tablet Take 50 mg by mouth 3 (three) times daily.    . isosorbide mononitrate (IMDUR) 30 MG 24 hr tablet Take 30 mg by mouth daily.    Marland Kitchen lidocaine-prilocaine (EMLA) cream Apply 1  application topically as needed. 30 g 0  . losartan (COZAAR) 50 MG tablet Take 1 tablet (50 mg total) by mouth daily. 90 tablet 3  . ondansetron (ZOFRAN-ODT) 4 MG disintegrating tablet     . oxyCODONE (OXY IR/ROXICODONE) 5 MG immediate release tablet Take 2 tablets (10 mg total) by mouth every 6 (six) hours as needed for severe pain. 56 tablet 0  . oxyCODONE (OXYCONTIN) 15 mg 12 hr tablet Take 2 tablets (30 mg total) by mouth every 12 (twelve) hours. 30 tablet 0  . oxyCODONE-acetaminophen (  PERCOCET) 10-325 MG tablet Take 1 tablet by mouth every 4 (four) hours as needed for pain. 84 tablet 0  . potassium chloride (KLOR-CON) 10 MEQ tablet Take 10 mEq by mouth daily.    Marland Kitchen sulfamethoxazole-trimethoprim (BACTRIM DS) 800-160 MG tablet Take 1 tablet by mouth daily. 30 tablet 5   No current facility-administered medications for this visit.   Facility-Administered Medications Ordered in Other Visits  Medication Dose Route Frequency Provider Last Rate Last Admin  . 0.9 %  sodium chloride infusion   Intravenous Continuous Derek Jack, MD      . acetaminophen (TYLENOL) tablet 650 mg  650 mg Oral Once Derek Jack, MD      . diphenhydrAMINE (BENADRYL) injection 50 mg  50 mg Intravenous Once Derek Jack, MD      . famotidine (PEPCID) IVPB 20 mg premix  20 mg Intravenous Once Derek Jack, MD      . heparin lock flush 100 unit/mL  500 Units Intracatheter Once PRN Derek Jack, MD      . ondansetron (ZOFRAN) 8 mg, dexamethasone (DECADRON) 10 mg in sodium chloride 0.9 % 50 mL IVPB   Intravenous Once Derek Jack, MD      . sodium chloride flush (NS) 0.9 % injection 10 mL  10 mL Intracatheter PRN Derek Jack, MD        ALLERGIES:  Allergies  Allergen Reactions  . Neosporin [Neomycin-Bacitracin Zn-Polymyx] Rash    PHYSICAL EXAM:  Performance status (ECOG): 0 - Asymptomatic  Vitals:   09/02/20 0805  BP: (!) 143/80  Pulse: 78  Resp: 18  Temp:  (!) 97.2 F (36.2 C)  SpO2: 100%   Wt Readings from Last 3 Encounters:  09/02/20 157 lb 14.4 oz (71.6 kg)  08/26/20 155 lb (70.3 kg)  08/21/20 153 lb 12.8 oz (69.8 kg)   Physical Exam Vitals reviewed.  Constitutional:      Appearance: Normal appearance.  Cardiovascular:     Rate and Rhythm: Normal rate and regular rhythm.     Pulses: Normal pulses.     Heart sounds: Normal heart sounds.  Pulmonary:     Effort: Pulmonary effort is normal.     Breath sounds: Normal breath sounds.  Chest:     Comments: Port-a-Cath in R chest Abdominal:     Palpations: Abdomen is soft. There is no mass.     Tenderness: There is no abdominal tenderness.  Neurological:     General: No focal deficit present.     Mental Status: Richard Cox is alert and oriented to person, place, and time.  Psychiatric:        Mood and Affect: Mood normal.        Behavior: Behavior normal.     LABORATORY DATA:  I have reviewed the labs as listed.  CBC Latest Ref Rng & Units 09/02/2020 08/26/2020 08/15/2020  WBC 4.0 - 10.5 K/uL 5.1 6.8 5.7  Hemoglobin 13.0 - 17.0 g/dL 11.9(L) 10.8(L) 10.9(L)  Hematocrit 39 - 52 % 35.5(L) 32.8(L) 34.9(L)  Platelets 150 - 400 K/uL 203 247 215   CMP Latest Ref Rng & Units 09/02/2020 08/26/2020 07/24/2020  Glucose 70 - 99 mg/dL 89 108(H) 97  BUN 6 - 20 mg/dL 21(H) 31(H) 21(H)  Creatinine 0.61 - 1.24 mg/dL 1.03 0.88 1.36(H)  Sodium 135 - 145 mmol/L 136 138 137  Potassium 3.5 - 5.1 mmol/L 3.2(L) 4.1 3.8  Chloride 98 - 111 mmol/L 99 100 101  CO2 22 - 32 mmol/L '26 26 26  ' Calcium  8.9 - 10.3 mg/dL 9.1 8.9 9.3  Total Protein 6.5 - 8.1 g/dL 6.9 7.1 8.0  Total Bilirubin 0.3 - 1.2 mg/dL 0.3 0.2(L) 0.5  Alkaline Phos 38 - 126 U/L 70 78 72  AST 15 - 41 U/L 22 12(L) 23  ALT 0 - 44 U/L '22 12 16   ' Lab Results  Component Value Date   LDH 162 09/02/2020   LDH 240 (H) 08/26/2020   LDH 309 (H) 07/24/2020    DIAGNOSTIC IMAGING:  I have independently reviewed the scans and discussed with the  patient. CT Biopsy  Result Date: 08/15/2020 INDICATION: 39 year old male with a history of most likely lymphoma, referred for biopsy EXAM: CT BIOPSY MEDICATIONS: None. ANESTHESIA/SEDATION: Moderate (conscious) sedation was employed during this procedure. A total of Versed 1.5 mg and Fentanyl 50 mcg was administered intravenously. Moderate Sedation Time: 10 minutes. The patient's level of consciousness and vital signs were monitored continuously by radiology nursing throughout the procedure under my direct supervision. FLUOROSCOPY TIME:  CT COMPLICATIONS: None PROCEDURE: Informed written consent was obtained from the patient after a thorough discussion of the procedural risks, benefits and alternatives. All questions were addressed. Maximal Sterile Barrier Technique was utilized including caps, mask, sterile gowns, sterile gloves, sterile drape, hand hygiene and skin antiseptic. A timeout was performed prior to the initiation of the procedure. Patient positioned supine position on CT gantry table. Scout CT was acquired for planning purposes. We targeted the sub a Paddock/gastrohepatic ligament mass for biopsy, in the subxiphoid region. Once the patient is prepped and draped in the usual sterile fashion, 1% lidocaine was used for local anesthesia. We then advanced trocar needle under CT guidance and multiple 18 gauge core biopsy were acquired. Needle was removed and a final image was stored. Patient tolerated the procedure well and remained hemodynamically stable throughout. No complications were encountered and no significant blood loss. IMPRESSION: Status post CT-guided biopsy of upper abdominal peritoneal mass. Signed, Dulcy Fanny. Dellia Nims, RPVI [facility] Radiology Electronically Signed   By: Corrie Mckusick D.O.   On: 08/15/2020 13:08   ECHOCARDIOGRAM COMPLETE  Result Date: 08/22/2020    ECHOCARDIOGRAM REPORT   Patient Name:   OGLE HOEFFNER Date of Exam:  08/22/2020 Medical Rec #:  [id number]  Height:       73.0 in Accession #:    [id number] Weight:       153.8 lb Date of Birth:  1981/05/16   BSA:          1.924 m Patient Age:    39 years   BP:           148/87 mmHg Patient Gender: M          HR:           77 bpm. Exam Location:  [facility] Procedure: 2D Echo, Cardiac Doppler and Color Doppler Indications:    Chemotherapy evaluation v87.41 / v58.11  History:        Patient has prior history of Echocardiogram examinations, most                 recent 09/18/2019. CHF; Risk Factors:Hypertension. AIDS (acquired                 immune deficiency syndrome), Alcohol abuse, Polysubstance abuse                 -cocaine use in remission, Secondary cardiomyopathy ([place]) (From  Hx).  Sonographer:    Alvino Chapel RCS Referring Phys: [phone] [place]  1. Left ventricular ejection fraction, by estimation, is 45 to 50%. The left ventricle has mildly decreased function. The left ventricle demonstrates global hypokinesis. There is severe left ventricular hypertrophy, septum greater than posterior wall. Left ventricular diastolic parameters are indeterminate. Consistent with hypertrophic cardiomyoapthy, although myocardial acoustic appearance suggests possible infiltrative cardiomyopathy.  2. Right ventricular systolic function is normal. The right ventricular size is normal. Moderately increased right ventricular wall thickness. Tricuspid regurgitation signal is inadequate for assessing PA pressure.  3. Left atrial size was moderately dilated.  4. The mitral valve is grossly normal. No evidence of mitral valve regurgitation.  5. The aortic valve is tricuspid. Aortic valve regurgitation is not visualized.  6. The inferior vena cava is normal in size with greater than 50% respiratory variability, suggesting right atrial pressure of 3 mmHg. FINDINGS  Left Ventricle: Left ventricular ejection fraction, by estimation, is 45 to 50%. The left ventricle has  mildly decreased function. The left ventricle demonstrates global hypokinesis. The left ventricular internal cavity size was normal in size. There is  severe left ventricular hypertrophy. Left ventricular diastolic parameters are indeterminate. Right Ventricle: The right ventricular size is normal. Moderately increased right ventricular wall thickness. Right ventricular systolic function is normal. Tricuspid regurgitation signal is inadequate for assessing PA pressure. Left Atrium: Left atrial size was moderately dilated. Right Atrium: Right atrial size was normal in size. Pericardium: There is no evidence of pericardial effusion. Mitral Valve: The mitral valve is grossly normal. No evidence of mitral valve regurgitation. Tricuspid Valve: The tricuspid valve is grossly normal. Tricuspid valve regurgitation is trivial. Aortic Valve: The aortic valve is tricuspid. Aortic valve regurgitation is not visualized. Pulmonic Valve: The pulmonic valve was grossly normal. Pulmonic valve regurgitation is trivial. Aorta: The aortic root is normal in size and structure. Venous: The inferior vena cava is normal in size with greater than 50% respiratory variability, suggesting right atrial pressure of 3 mmHg. IAS/Shunts: No atrial level shunt detected by color flow Doppler.  LEFT VENTRICLE PLAX 2D LVIDd:         4.23 cm  Diastology LVIDs:         3.27 cm  LV e' medial:    4.13 cm/s LV PW:         2.29 cm  LV E/e' medial:  14.8 LV IVS:        3.30 cm  LV e' lateral:   7.40 cm/s LVOT diam:     2.50 cm  LV E/e' lateral: 8.3 LV SV:         126 LV SV Index:   66 LVOT Area:     4.91 cm  RIGHT VENTRICLE RV S prime:     12.60 cm/s TAPSE (M-mode): 2.2 cm LEFT ATRIUM             Index       RIGHT ATRIUM           Index LA diam:        5.10 cm 2.65 cm/m  RA Area:     16.20 cm LA Vol (A2C):   88.5 ml 45.99 ml/m RA Volume:   42.00 ml  21.83 ml/m LA Vol (A4C):   88.4 ml 45.94 ml/m LA Biplane Vol: 90.6 ml 47.08 ml/m  AORTIC VALVE LVOT  Vmax:   143.00 cm/s LVOT Vmean:  83.200 cm/s LVOT VTI:    0.257 m  AORTA Ao  Root diam: 3.40 cm MITRAL VALVE MV Area (PHT): 3.51 cm    SHUNTS MV Decel Time: 216 msec    Systemic VTI:  0.26 m MV E velocity: 61.30 cm/s  Systemic Diam: 2.50 cm MV A velocity: 58.50 cm/s MV E/A ratio:  1.05 Rozann Lesches MD Electronically signed by Rozann Lesches MD Signature Date/Time: 08/22/2020/12:19:50 PM    Final    IR IMAGING GUIDED PORT INSERTION  Result Date: 08/23/2020 INDICATION: 39 year old male with history of lymphoma requiring central venous access for chemotherapy. EXAM: IMPLANTED PORT A CATH PLACEMENT WITH ULTRASOUND AND FLUOROSCOPIC GUIDANCE COMPARISON:  None. MEDICATIONS: Ancef 2 gm IV; The antibiotic was administered within an appropriate time interval prior to skin puncture. ANESTHESIA/SEDATION: Moderate (conscious) sedation was employed during this procedure. A total of Versed 2 mg and Fentanyl 100 mcg was administered intravenously. Moderate Sedation Time: 25 minutes. The patient's level of consciousness and vital signs were monitored continuously by radiology nursing throughout the procedure under my direct supervision. CONTRAST:  None FLUOROSCOPY TIME:  0 minutes, 12 seconds (1 mGy) COMPLICATIONS: None immediate. PROCEDURE: The procedure, risks, benefits, and alternatives were explained to the patient. Questions regarding the procedure were encouraged and answered. The patient understands and consents to the procedure. The right neck and chest were prepped with chlorhexidine in a sterile fashion, and a sterile drape was applied covering the operative field. Maximum barrier sterile technique with sterile gowns and gloves were used for the procedure. A timeout was performed prior to the initiation of the procedure. Ultrasound was used to examine the jugular vein which was compressible and free of internal echoes. A skin marker was used to demarcate the planned venotomy and port pocket incision sites. Local  anesthesia was provided to these sites and the subcutaneous tunnel track with 1% lidocaine with 1:100,000 epinephrine. A small incision was created at the jugular access site and blunt dissection was performed of the subcutaneous tissues. Under real time ultrasound guidance, the jugular vein was accessed with a 21 ga micropuncture needle and an 0.018" wire was inserted to the superior vena cava. A 5 Fr micopuncture set was then used, through which a 0.035" Rosen wire was passed under fluoroscopic guidance into the inferior vena cava. An 8 Fr dilator was then placed over the wire. A subcutaneous port pocket was then created along the upper chest wall utilizing a combination of sharp and blunt dissection. The pocket was irrigated with sterile saline, packed with gauze, and observed for hemorrhage. A single lumen power injectable port was chosen for placement. The 8 Fr catheter was tunneled from the port pocket site to the venotomy incision. The port was placed in the pocket. The external catheter was trimmed to appropriate length. The dilator was exchanged for an 8 Fr peel-away sheath under fluoroscopic guidance. The catheter was then placed through the sheath and the sheath was removed. Final catheter positioning was confirmed and documented with a fluoroscopic spot radiograph. The port was accessed with a Huber needle, aspirated, and flushed with heparinized saline. The deep dermal layer of the port pocket incision was closed with interrupted 3-0 Vicryl suture. The skin was opposed with a running subcuticular 4-0 Monocryl suture. Dermabond was then placed over the port pocket and neck incisions. The patient tolerated the procedure well without immediate post procedural complication. FINDINGS: After catheter placement, the tip lies within the cavoatrial junction the catheter aspirates and flushes normally and is ready for immediate use. IMPRESSION: Successful placement of a power injectable Port-A-Cath via the right  internal jugular vein. The catheter is ready for immediate use. Ruthann Cancer, MD [facility] Radiology Electronically Signed   By: Ruthann Cancer MD   On: 08/23/2020 10:09     ASSESSMENT:  1.B-cell high-grade lymphoma: -Presentation to the ER with left upper quadrant and mid abdominal pain for 2 weeks. -10 pound weight loss in the last 2 weeks due to decreased appetite. Denies any fevers or night sweats. -CT AP with contrast on 07/24/2020 shows 13.8 11.2 cm mass in the epigastric region, lobulated large mass in the mesenteric region, 9.3 x 5.1 cm mass in the inferior pelvis, 9.7 x 4.1 cm mass in the superior pelvis. Epigastric mass seems to abut posterior spleen. -LDH was elevated at 309, uric acid 10.2. -PET scan on 07/31/2020 showed extensive multifocal soft tissue masses within the abdomen and pelvis, most of them between SUV 15 and 20. FDG avid subpleural nodules overlying the right lung. Splenomegaly. Multiple scattered lung nodules, nonspecific. -Biopsy of the soft tissue mass in the upper abdomen on 08/15/2020 consistent with high-grade B-cell lymphoma. IHC positive for CD20, CD10, BCL6, negative for BCL-2, CD3, CD5, CD30, mum 1 and EBV. Ki-67 is 80-90%. Differential diagnosis includes Burkitt's lymphoma another large B-cell lymphoma's.  2. Social/family history: -Richard Cox quit smoking last year. -Family history significant for mother and father who had multiple myeloma. Grandfather had colon cancer.  3. HIV/AIDS: -Richard Cox is on Biktarvy 1 tablet by mouth daily.  4. CHF: -Echocardiogram on 08/22/2020 with EF 45-50%. -Echocardiogram on 09/18/2019 shows EF 40-45%. -Richard Cox is on Coreg 6.25 mg twice daily, Imdur 30 mg daily. Lasix 40 mg is an expired medication list.   PLAN:  1.High-grade B cell lymphoma: -High risk FISH panel is pending. -Richard Cox was evaluated by Dr. Lonia Blood at [facility].  Richard Cox was felt not a candidate for clinical trial. -I have  reviewed his labs today.  LDH is 162.  Uric acid is 4.0. -Potassium is 3.2.  Replete potassium. -We will proceed with dose adjusted R-EPOCH.  We discussed the side effects in detail.  Continue prednisone for 5 days at 100 mg flat dose. -We plan to repeat echocardiogram prior to cycle 2. -Reevaluate for tumor lysis labs in 1 week.  I plan to see him back in 3 weeks prior to his next cycle.  2. Abdominal pain: -Pain has improved since steroids started.  We will discontinue MS Contin. -We will give prescription for oxycodone to be taken as needed.  3. Nausea: -Continue Zofran ODT 4 mg every 8 hours as needed.  4. Hyperuricemia: -Continue allopurinol 300 mg daily.  Rasburicase 6 mg dose today prior to chemotherapy.   Orders placed this encounter:  No orders of the defined types were placed in this encounter.  Addendum: -Patient developed reaction when rituximab was infusing at 200 ml/h. -Richard Cox developed scratchiness of the throat and feeling like throat was swelling up.  Richard Cox also noted some chills. -His infusion was stopped and we gave Solu-Medrol 125 mg and Demerol 50 mg.  His symptoms improved.  Subsequently Richard Cox was started back at same rate and titrated up.  On further evaluation, Richard Cox did not have any problems. Total time spent is 40 minutes.  Derek Jack, MD [place] [phone]   I, Milinda Antis, am acting as a scribe for Dr. Sanda Linger.  I, Derek Jack MD, have reviewed the above documentation for accuracy and completeness, and I agree with the above.

## 2020-09-02 NOTE — Progress Notes (Deleted)
Patient was assessed by Dr. Delton Coombes and labs have been reviewed.  Potassium 3.2, orders received for potassium 40 mEq by mouth times once today. Patient is okay to proceed with treatment today. Primary RN and pharmacy aware.

## 2020-09-02 NOTE — Patient Instructions (Signed)
[place] [facility] Discharge Instructions  You were seen today by Dr. Delton Coombes. He went over your recent results. You received your first treatment today; continue receiving your treatment daily. Drink plenty of fluids daily to flush your kidneys from the chemo. You will be prescribed prednisone 100 mg to take for 5 days during the first week of every cycle. Check your temperature daily and if your develop a fever of 100.5 F or higher, go to the ER immediately. Your next appointment will be in 1 week with the nurse practitioner for labs and follow up. You will be scheduled for an echo before your next visit. Dr. Delton Coombes will see you back in 3 weeks for labs and follow up.   Thank you for choosing [place] at [facility] to provide your oncology and hematology care.  To afford each patient quality time with our provider, please arrive at least 15 minutes before your scheduled appointment time.   If you have a lab appointment with the [facility] please come in thru the Main Entrance and check in at the main information desk  You need to re-schedule your appointment should you arrive 10 or more minutes late.  We strive to give you quality time with our providers, and arriving late affects you and other patients whose appointments are after yours.  Also, if you no show three or more times for appointments you may be dismissed from the clinic at the providers discretion.     Again, thank you for choosing [facility].  Our hope is that these requests will decrease the amount of time that you wait before being seen by our physicians.       _____________________________________________________________  Should you have questions after your visit to [facility], please contact our office at [phone] between the hours of 8:00 a.m. and 4:30 p.m.  Voicemails left after 4:00 p.m. will not be returned until the following  business day.  For prescription refill requests, have your pharmacy contact our office and allow 72 hours.    Cancer Center Support Programs:   > Cancer Support Group  2nd Tuesday of the month 1pm-2pm, Journey Room

## 2020-09-03 ENCOUNTER — Encounter (HOSPITAL_COMMUNITY): Payer: Self-pay | Admitting: Hematology

## 2020-09-03 ENCOUNTER — Encounter (HOSPITAL_COMMUNITY): Payer: Self-pay

## 2020-09-03 ENCOUNTER — Inpatient Hospital Stay (HOSPITAL_COMMUNITY): Payer: 59

## 2020-09-03 VITALS — BP 119/75 | HR 68 | Temp 98.1°F | Resp 18

## 2020-09-03 DIAGNOSIS — Z5111 Encounter for antineoplastic chemotherapy: Secondary | ICD-10-CM | POA: Diagnosis not present

## 2020-09-03 DIAGNOSIS — C8510 Unspecified B-cell lymphoma, unspecified site: Secondary | ICD-10-CM

## 2020-09-03 LAB — SURGICAL PATHOLOGY

## 2020-09-03 MED ORDER — VINCRISTINE SULFATE CHEMO INJECTION 1 MG/ML
Freq: Once | INTRAVENOUS | Status: AC
  Filled 2020-09-03: qty 10

## 2020-09-03 MED ORDER — SODIUM CHLORIDE 0.9 % IV SOLN
Freq: Once | INTRAVENOUS | Status: AC
  Administered 2020-09-03: 8 mg via INTRAVENOUS
  Filled 2020-09-03: qty 4

## 2020-09-03 MED ORDER — PREDNISONE 50 MG PO TABS
100.0000 mg | ORAL_TABLET | Freq: Once | ORAL | Status: AC
  Administered 2020-09-03: 100 mg via ORAL
  Filled 2020-09-03: qty 2

## 2020-09-03 MED ORDER — SODIUM CHLORIDE 0.9 % IV SOLN: INTRAVENOUS | Status: DC

## 2020-09-03 NOTE — Progress Notes (Signed)
Pt here today for D2C1.  Pt tolerated 22 hr ambulatory pump well last night.  Pump disconnected.  Dressing changed.   Pre-meds administered. 100 mg prednisone given. Connected to ambulatory infusion pump.  Pt discharged ambulatory in stable condition.

## 2020-09-03 NOTE — Patient Instructions (Signed)
[facility] Discharge Instructions for Patients Receiving Chemotherapy  Today you received the following chemotherapy agents   To help prevent nausea and vomiting after your treatment, we encourage you to take your nausea medication   If you develop nausea and vomiting that is not controlled by your nausea medication, call the clinic.   BELOW ARE SYMPTOMS THAT SHOULD BE REPORTED IMMEDIATELY:  *FEVER GREATER THAN 100.5 F  *CHILLS WITH OR WITHOUT FEVER  NAUSEA AND VOMITING THAT IS NOT CONTROLLED WITH YOUR NAUSEA MEDICATION  *UNUSUAL SHORTNESS OF BREATH  *UNUSUAL BRUISING OR BLEEDING  TENDERNESS IN MOUTH AND THROAT WITH OR WITHOUT PRESENCE OF ULCERS  *URINARY PROBLEMS  *BOWEL PROBLEMS  UNUSUAL RASH Items with * indicate a potential emergency and should be followed up as soon as possible.  Feel free to call the clinic should you have any questions or concerns. The clinic phone number is [phone].  Please show the CHEMO ALERT CARD at check-in to the Emergency Department and triage nurse.   

## 2020-09-04 ENCOUNTER — Encounter (HOSPITAL_COMMUNITY): Payer: Self-pay

## 2020-09-04 ENCOUNTER — Inpatient Hospital Stay (HOSPITAL_COMMUNITY): Payer: 59

## 2020-09-04 ENCOUNTER — Other Ambulatory Visit: Payer: Self-pay

## 2020-09-04 VITALS — BP 113/64 | HR 67 | Temp 97.4°F | Resp 18

## 2020-09-04 DIAGNOSIS — C8510 Unspecified B-cell lymphoma, unspecified site: Secondary | ICD-10-CM

## 2020-09-04 DIAGNOSIS — Z5111 Encounter for antineoplastic chemotherapy: Secondary | ICD-10-CM | POA: Diagnosis not present

## 2020-09-04 MED ORDER — PREDNISONE 50 MG PO TABS
100.0000 mg | ORAL_TABLET | Freq: Once | ORAL | Status: AC
  Administered 2020-09-04: 100 mg via ORAL
  Filled 2020-09-04: qty 2

## 2020-09-04 MED ORDER — SODIUM CHLORIDE 0.9 % IV SOLN: INTRAVENOUS | Status: DC

## 2020-09-04 MED ORDER — VINCRISTINE SULFATE CHEMO INJECTION 1 MG/ML
Freq: Once | INTRAVENOUS | Status: AC
  Filled 2020-09-04: qty 10

## 2020-09-04 MED ORDER — SODIUM CHLORIDE 0.9 % IV SOLN
Freq: Once | INTRAVENOUS | Status: AC
  Administered 2020-09-04: 8 mg via INTRAVENOUS
  Filled 2020-09-04: qty 4

## 2020-09-04 NOTE — Progress Notes (Signed)
Pt here for C1D3.  Pt did experience some nausea after drinking a boost this am, thinks could have been related to the boost.  Has not had any since.   Pt disconnected from his ambulatory chemo pump.  Site looks WNL.  Dressing intact.  Vital signs stable.  100 mg by mouth Predinose given at bedside today. Pre-medication administered.  Re-connected pts ambulatory chemo pump for 22 hrs.  Pt discharged ambulatory in stable condition with ambulatory pump.

## 2020-09-04 NOTE — Patient Instructions (Signed)
[facility] Discharge Instructions for Patients Receiving Chemotherapy  Today you received the following chemotherapy agents   To help prevent nausea and vomiting after your treatment, we encourage you to take your nausea medication   If you develop nausea and vomiting that is not controlled by your nausea medication, call the clinic.   BELOW ARE SYMPTOMS THAT SHOULD BE REPORTED IMMEDIATELY:  *FEVER GREATER THAN 100.5 F  *CHILLS WITH OR WITHOUT FEVER  NAUSEA AND VOMITING THAT IS NOT CONTROLLED WITH YOUR NAUSEA MEDICATION  *UNUSUAL SHORTNESS OF BREATH  *UNUSUAL BRUISING OR BLEEDING  TENDERNESS IN MOUTH AND THROAT WITH OR WITHOUT PRESENCE OF ULCERS  *URINARY PROBLEMS  *BOWEL PROBLEMS  UNUSUAL RASH Items with * indicate a potential emergency and should be followed up as soon as possible.  Feel free to call the clinic should you have any questions or concerns. The clinic phone number is [phone].  Please show the CHEMO ALERT CARD at check-in to the Emergency Department and triage nurse.   

## 2020-09-05 ENCOUNTER — Encounter (HOSPITAL_COMMUNITY): Payer: Self-pay

## 2020-09-05 ENCOUNTER — Inpatient Hospital Stay (HOSPITAL_COMMUNITY): Payer: 59

## 2020-09-05 VITALS — BP 104/51 | HR 67 | Temp 97.2°F | Resp 18

## 2020-09-05 DIAGNOSIS — C8510 Unspecified B-cell lymphoma, unspecified site: Secondary | ICD-10-CM

## 2020-09-05 DIAGNOSIS — Z5111 Encounter for antineoplastic chemotherapy: Secondary | ICD-10-CM | POA: Diagnosis not present

## 2020-09-05 MED ORDER — SODIUM CHLORIDE 0.9 % IV SOLN
Freq: Once | INTRAVENOUS | Status: AC
  Administered 2020-09-05: 8 mg via INTRAVENOUS
  Filled 2020-09-05: qty 4

## 2020-09-05 MED ORDER — PREDNISONE 50 MG PO TABS
100.0000 mg | ORAL_TABLET | Freq: Once | ORAL | Status: AC
  Administered 2020-09-05: 100 mg via ORAL
  Filled 2020-09-05: qty 2

## 2020-09-05 MED ORDER — VINCRISTINE SULFATE CHEMO INJECTION 1 MG/ML
Freq: Once | INTRAVENOUS | Status: AC
  Filled 2020-09-05: qty 10

## 2020-09-05 MED ORDER — SODIUM CHLORIDE 0.9 % IV SOLN: INTRAVENOUS | Status: DC

## 2020-09-05 NOTE — Patient Instructions (Signed)
[facility] Discharge Instructions for Patients Receiving Chemotherapy  Today you received the following chemotherapy agents   To help prevent nausea and vomiting after your treatment, we encourage you to take your nausea medication   If you develop nausea and vomiting that is not controlled by your nausea medication, call the clinic.   BELOW ARE SYMPTOMS THAT SHOULD BE REPORTED IMMEDIATELY:  *FEVER GREATER THAN 100.5 F  *CHILLS WITH OR WITHOUT FEVER  NAUSEA AND VOMITING THAT IS NOT CONTROLLED WITH YOUR NAUSEA MEDICATION  *UNUSUAL SHORTNESS OF BREATH  *UNUSUAL BRUISING OR BLEEDING  TENDERNESS IN MOUTH AND THROAT WITH OR WITHOUT PRESENCE OF ULCERS  *URINARY PROBLEMS  *BOWEL PROBLEMS  UNUSUAL RASH Items with * indicate a potential emergency and should be followed up as soon as possible.  Feel free to call the clinic should you have any questions or concerns. The clinic phone number is [phone].  Please show the CHEMO ALERT CARD at check-in to the Emergency Department and triage nurse.   

## 2020-09-05 NOTE — Progress Notes (Signed)
Pt here for Day 4 treatment. He is having some weakness in his right leg. He is still walking and climbing stairs. Notices its little weaker than other leg. No pain or swelling noted. Dr Raliegh Ip is aware. Pt also has small amount of nausea upon awakening.  Talk about taking compazine. Also noticed that his taste when eating has started to disappear.  Explained this could be side effect from his chemo. VS WNL limits for treatment today.   Pump disconnected.  Site WNL.  Pre-med given.  100 mg prednisone given at bedside. Pump re-connected for 22 hrs.  Pt discharged in stable condition ambulatory.

## 2020-09-06 ENCOUNTER — Inpatient Hospital Stay (HOSPITAL_COMMUNITY): Payer: 59

## 2020-09-06 ENCOUNTER — Other Ambulatory Visit: Payer: Self-pay

## 2020-09-06 VITALS — BP 116/61 | HR 66 | Temp 97.4°F | Resp 18

## 2020-09-06 DIAGNOSIS — C8510 Unspecified B-cell lymphoma, unspecified site: Secondary | ICD-10-CM

## 2020-09-06 DIAGNOSIS — Z5111 Encounter for antineoplastic chemotherapy: Secondary | ICD-10-CM | POA: Diagnosis not present

## 2020-09-06 MED ORDER — PREDNISONE 50 MG PO TABS
100.0000 mg | ORAL_TABLET | Freq: Once | ORAL | Status: AC
  Administered 2020-09-06: 100 mg via ORAL
  Filled 2020-09-06: qty 2

## 2020-09-06 MED ORDER — PROCHLORPERAZINE MALEATE 10 MG PO TABS: 10.0000 mg | ORAL_TABLET | Freq: Four times a day (QID) | ORAL | 2 refills | Status: DC | PRN

## 2020-09-06 MED ORDER — SODIUM CHLORIDE 0.9 % IV SOLN
Freq: Once | INTRAVENOUS | Status: AC
  Administered 2020-09-06: 16 mg via INTRAVENOUS
  Filled 2020-09-06: qty 8

## 2020-09-06 MED ORDER — HEPARIN SOD (PORK) LOCK FLUSH 100 UNIT/ML IV SOLN
500.0000 [IU] | Freq: Once | INTRAVENOUS | Status: AC
  Administered 2020-09-06: 500 [IU] via INTRAVENOUS

## 2020-09-06 MED ORDER — SODIUM CHLORIDE 0.9 % IV SOLN
Freq: Once | INTRAVENOUS | Status: DC
  Filled 2020-09-06: qty 8

## 2020-09-06 MED ORDER — SODIUM CHLORIDE 0.9 % IV SOLN
750.0000 mg/m2 | Freq: Once | INTRAVENOUS | Status: AC
  Administered 2020-09-06: 1420 mg via INTRAVENOUS
  Filled 2020-09-06: qty 71

## 2020-09-06 MED ORDER — SODIUM CHLORIDE 0.9 % IV SOLN: INTRAVENOUS | Status: DC

## 2020-09-06 NOTE — Progress Notes (Signed)
Richard Cox presents to have home infusion pump d/c'd.  Portacath located right chest wall accessed with  H 20 needle.  Good blood return present. Port remains accessed for Cytoxan infusion.   Tolerated infusion w/o adverse reaction.  Alert, in no distress.  VSS.  Discharged ambulatory in stable condition.

## 2020-09-09 ENCOUNTER — Other Ambulatory Visit: Payer: Self-pay

## 2020-09-09 ENCOUNTER — Inpatient Hospital Stay (HOSPITAL_COMMUNITY): Payer: 59

## 2020-09-09 ENCOUNTER — Other Ambulatory Visit (HOSPITAL_COMMUNITY): Payer: 59

## 2020-09-09 ENCOUNTER — Inpatient Hospital Stay (HOSPITAL_BASED_OUTPATIENT_CLINIC_OR_DEPARTMENT_OTHER): Payer: 59 | Admitting: Oncology

## 2020-09-09 ENCOUNTER — Inpatient Hospital Stay (HOSPITAL_COMMUNITY): Payer: 59 | Attending: Hematology

## 2020-09-09 VITALS — BP 102/50 | HR 77 | Temp 97.3°F | Resp 18 | Wt 157.2 lb

## 2020-09-09 DIAGNOSIS — Z5189 Encounter for other specified aftercare: Secondary | ICD-10-CM | POA: Diagnosis not present

## 2020-09-09 DIAGNOSIS — C8510 Unspecified B-cell lymphoma, unspecified site: Secondary | ICD-10-CM

## 2020-09-09 DIAGNOSIS — R630 Anorexia: Secondary | ICD-10-CM | POA: Insufficient documentation

## 2020-09-09 DIAGNOSIS — Z79899 Other long term (current) drug therapy: Secondary | ICD-10-CM | POA: Diagnosis not present

## 2020-09-09 DIAGNOSIS — Z5112 Encounter for antineoplastic immunotherapy: Secondary | ICD-10-CM | POA: Insufficient documentation

## 2020-09-09 DIAGNOSIS — R5383 Other fatigue: Secondary | ICD-10-CM | POA: Diagnosis not present

## 2020-09-09 DIAGNOSIS — R197 Diarrhea, unspecified: Secondary | ICD-10-CM | POA: Insufficient documentation

## 2020-09-09 DIAGNOSIS — Z5111 Encounter for antineoplastic chemotherapy: Secondary | ICD-10-CM | POA: Diagnosis present

## 2020-09-09 DIAGNOSIS — R1032 Left lower quadrant pain: Secondary | ICD-10-CM | POA: Diagnosis not present

## 2020-09-09 DIAGNOSIS — Z87891 Personal history of nicotine dependence: Secondary | ICD-10-CM | POA: Diagnosis not present

## 2020-09-09 DIAGNOSIS — I110 Hypertensive heart disease with heart failure: Secondary | ICD-10-CM | POA: Insufficient documentation

## 2020-09-09 DIAGNOSIS — I5030 Unspecified diastolic (congestive) heart failure: Secondary | ICD-10-CM | POA: Diagnosis not present

## 2020-09-09 DIAGNOSIS — Z800 Family history of malignant neoplasm of digestive organs: Secondary | ICD-10-CM | POA: Diagnosis not present

## 2020-09-09 LAB — CBC WITH DIFFERENTIAL/PLATELET
Abs Immature Granulocytes: 0.04 10*3/uL (ref 0.00–0.07)
Basophils Absolute: 0 10*3/uL (ref 0.0–0.1)
Basophils Relative: 0 %
Eosinophils Absolute: 0.1 10*3/uL (ref 0.0–0.5)
Eosinophils Relative: 2 %
HCT: 34.7 % — ABNORMAL LOW (ref 39.0–52.0)
Hemoglobin: 11.6 g/dL — ABNORMAL LOW (ref 13.0–17.0)
Immature Granulocytes: 1 %
Lymphocytes Relative: 17 %
Lymphs Abs: 0.7 10*3/uL (ref 0.7–4.0)
MCH: 29.3 pg (ref 26.0–34.0)
MCHC: 33.4 g/dL (ref 30.0–36.0)
MCV: 87.6 fL (ref 80.0–100.0)
Monocytes Absolute: 0 10*3/uL — ABNORMAL LOW (ref 0.1–1.0)
Monocytes Relative: 1 %
Neutro Abs: 3.4 10*3/uL (ref 1.7–7.7)
Neutrophils Relative %: 79 %
Platelets: 158 10*3/uL (ref 150–400)
RBC: 3.96 MIL/uL — ABNORMAL LOW (ref 4.22–5.81)
RDW: 16.7 % — ABNORMAL HIGH (ref 11.5–15.5)
WBC: 4.3 10*3/uL (ref 4.0–10.5)
nRBC: 0 % (ref 0.0–0.2)

## 2020-09-09 LAB — COMPREHENSIVE METABOLIC PANEL
ALT: 27 U/L (ref 0–44)
AST: 25 U/L (ref 15–41)
Albumin: 3.1 g/dL — ABNORMAL LOW (ref 3.5–5.0)
Alkaline Phosphatase: 56 U/L (ref 38–126)
Anion gap: 8 (ref 5–15)
BUN: 24 mg/dL — ABNORMAL HIGH (ref 6–20)
CO2: 26 mmol/L (ref 22–32)
Calcium: 8.4 mg/dL — ABNORMAL LOW (ref 8.9–10.3)
Chloride: 101 mmol/L (ref 98–111)
Creatinine, Ser: 1 mg/dL (ref 0.61–1.24)
GFR, Estimated: >60 mL/min (ref >60)
Glucose, Bld: 86 mg/dL (ref 70–99)
Potassium: 3.4 mmol/L — ABNORMAL LOW (ref 3.5–5.1)
Sodium: 135 mmol/L (ref 135–145)
Total Bilirubin: 0.6 mg/dL (ref 0.3–1.2)
Total Protein: 6.1 g/dL — ABNORMAL LOW (ref 6.5–8.1)

## 2020-09-09 LAB — LACTATE DEHYDROGENASE: LDH: 209 U/L — ABNORMAL HIGH (ref 98–192)

## 2020-09-09 LAB — PHOSPHORUS: Phosphorus: 3.7 mg/dL (ref 2.5–4.6)

## 2020-09-09 LAB — URIC ACID: Uric Acid, Serum: 5 mg/dL (ref 3.7–8.6)

## 2020-09-09 MED ORDER — PEGFILGRASTIM-CBQV 6 MG/0.6ML ~~LOC~~ SOSY
6.0000 mg | PREFILLED_SYRINGE | Freq: Once | SUBCUTANEOUS | Status: AC
  Administered 2020-09-09: 6 mg via SUBCUTANEOUS
  Filled 2020-09-09: qty 0.6

## 2020-09-09 NOTE — Progress Notes (Signed)
Patient tolerated Udenyca injection with no complaints voiced.  Site clean and dry with no bruising or swelling noted at site.  Band aid applied.  VSS with discharge and left ambulatory with no s/s of distress noted.  24 Hour post chemo check     Pt stated that he felt very energetic this weekend, and his only complaint is diarrhea today.  The pt has taken Immodium to help with the diarrhea.

## 2020-09-09 NOTE — Progress Notes (Signed)
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[place])   09/02/2020 -  Chemotherapy   The patient had pegfilgrastim-cbqv (UDENYCA) injection 6 mg, 6 mg, Subcutaneous, Once, 1 of 4 cycles DOXOrubicin (ADRIAMYCIN) 20 mg, etoposide (VEPESID) 96 mg, vinCRIStine (ONCOVIN) 0.8 mg in sodium chloride 0.9 % 500 mL chemo infusion, , Intravenous, Once, 1 of 4 cycles Administration:  (09/02/2020),  (09/03/2020),  (09/04/2020),  (09/05/2020) ondansetron (ZOFRAN) 8 mg, dexamethasone (DECADRON) 10 mg in sodium chloride 0.9 % 50 mL IVPB, , Intravenous,  Once, 1 of 4 cycles Administration: 8 mg (09/02/2020), 8 mg (09/03/2020), 16 mg (09/06/2020), 8 mg (09/04/2020), 8 mg (09/05/2020) cyclophosphamide (CYTOXAN) 1,420 mg in sodium chloride 0.9 % 250 mL chemo infusion, 750 mg/m2 = 1,420 mg, Intravenous,  Once, 1 of 4 cycles Administration: 1,420 mg (09/06/2020) riTUXimab-pvvr (RUXIENCE) 700 mg in sodium chloride 0.9 % 250 mL (2.1875 mg/mL) infusion, 375 mg/m2 = 700 mg, Intravenous,  Once, 1 of 4 cycles Administration: 700 mg (09/02/2020)  for chemotherapy treatment.    09/02/2020 Genetic Testing         CANCER STAGING: Cancer Staging No matching staging information was found for the patient.  INTERVAL HISTORY:  Richard Cox, a 39 y.o. male, returns to assess tolerance of cycle 1 R-EPOCH.  Ever was last seen on 09/02/2020.  Received cycle #1 of R-EPOCH from 09/02/20-09/06/20.  He will receive Udenyca today.  Overall, he states he is doing pretty good.  Admits to 4  loose stools this morning.  His appetite is good.  Abdominal pain is stable.  He has intermittent sharp stabbing pain to left lower and upper quadrant.  He is taking his oxycodone with relief.  He denies any fevers or recent illness.  He has been checking his temperatures daily.  He is drinking 1-2 boost daily.  He is taking his allopurinol as prescribed.  Feels like he tolerated his first cycle well.  He spoke to Dr. Lonia Blood at [facility] on 10/20.    REVIEW OF SYSTEMS:  Review of Systems  Constitutional: Negative.  Negative for appetite change, chills, fatigue and fever.  HENT:  Negative.  Negative for hearing loss, lump/mass, mouth sores and nosebleeds.   Eyes: Negative.  Negative for eye problems.  Respiratory: Negative for cough, hemoptysis and shortness of breath.   Cardiovascular: Negative.  Negative for chest pain and leg swelling.  Gastrointestinal: Positive for abdominal pain and diarrhea. Negative for blood in stool, constipation, nausea and vomiting.  Endocrine: Negative.  Negative for hot flashes.  Genitourinary: Negative.  Negative for bladder incontinence, difficulty urinating, dysuria, frequency and hematuria.   Musculoskeletal: Negative.  Negative for back pain, flank pain, gait problem and myalgias.  Skin: Negative.  Negative for itching and rash.  Neurological: Negative.  Negative for dizziness, gait problem, headaches, light-headedness and numbness.  Hematological: Negative.  Negative for adenopathy.  Psychiatric/Behavioral: Negative for confusion. The patient is not nervous/anxious.     PAST MEDICAL/SURGICAL HISTORY:  Past Medical History:  Diagnosis Date  . CHF (congestive heart failure) ([place])   . Essential  hypertension   . HIV infection ([place])   . Noncompliance with medication regimen   . 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 IR IMAGING GUIDED PORT INSERTION  08/23/2020   Right  . NO PAST SURGERIES      SOCIAL  HISTORY:  Social History   Socioeconomic History  . Marital status: Single    Spouse name: Not on file  . Number of children: 0  . Years of education: 70  . Highest education level: Not on file  Occupational History  . Not on file  Tobacco Use  . Smoking status: Light Tobacco Smoker    Packs/day: 0.25    Types: Cigarettes  . Smokeless tobacco: Never Used  Vaping Use  . Vaping Use: Never used  Substance and Sexual Activity  . Alcohol use: Not Currently  . Drug use: Not Currently    Types: Marijuana    Comment: x 1 week  . Sexual activity: Not Currently    Comment: pt given condoms  Other Topics Concern  . Not on file  Social History Narrative  . Not on file   Social Determinants of Health   Financial Resource Strain:   . Difficulty of Paying Living Expenses: Not on file  Food Insecurity:   . Worried About Charity fundraiser in the Last Year: Not on file  . Ran Out of Food in the Last Year: Not on file  Transportation Needs:   . Lack of Transportation (Medical): Not on file  . Lack of Transportation (Non-Medical): Not on file  Physical Activity:   . Days of Exercise per Week: Not on file  . Minutes of Exercise per Session: Not on file  Stress:   . Feeling of Stress : Not on file  Social Connections:   . Frequency of Communication with Friends and Family: Not on file  . Frequency of Social Gatherings with Friends and Family: Not on file  . Attends Religious Services: Not on file  . Active Member of Clubs or Organizations: Not on file  . Attends Archivist Meetings: Not on file  . Marital Status: Not on file  Intimate Partner Violence:   . Fear of Current or Ex-Partner: Not on file  . Emotionally Abused: Not on file  . Physically Abused: Not on file  . Sexually Abused: Not on file    FAMILY HISTORY:  Family History  Problem Relation Age of Onset  . Diabetes Mellitus II Mother   . Cancer Mother        multiple myeloma  . Cancer Father         multiple myeloma  . Congestive Heart Failure Brother   . Heart Problems Brother   . Lupus Brother     CURRENT MEDICATIONS:  Current Outpatient Medications  Medication Sig Dispense Refill  . acetaminophen (TYLENOL) 500 MG tablet Take 1,000 mg by mouth every 6 (six) hours as needed for moderate pain or headache.    . allopurinol (ZYLOPRIM) 300 MG tablet Take 1 tablet (300 mg total) by mouth daily. 30 tablet 3  . bictegravir-emtricitabine-tenofovir AF (BIKTARVY) 50-200-25 MG TABS tablet Take 1 tablet by mouth daily. 30 tablet 5  . carvedilol (COREG) 6.25 MG tablet Take 1 tablet (6.25 mg total) by mouth 2 (two) times daily with a meal. For BP and Heart (Patient taking differently: Take 6.25 mg by mouth 2 (two) times daily with a meal. ) 60 tablet 11  .  dicyclomine (BENTYL) 10 MG capsule     . docusate sodium (COLACE) 100 MG capsule Take 100 mg by mouth daily as needed for mild constipation.    . furosemide (LASIX) 40 MG tablet Take 1 tablet (40 mg total) by mouth daily. 30 tablet 3  . hydrALAZINE (APRESOLINE) 50 MG tablet Take 50 mg by mouth 3 (three) times daily.    . isosorbide mononitrate (IMDUR) 30 MG 24 hr tablet Take 30 mg by mouth daily.    Marland Kitchen lidocaine-prilocaine (EMLA) cream Apply 1 application topically as needed. 30 g 0  . losartan (COZAAR) 50 MG tablet Take 1 tablet (50 mg total) by mouth daily. 90 tablet 3  . ondansetron (ZOFRAN-ODT) 4 MG disintegrating tablet     . oxyCODONE (OXY IR/ROXICODONE) 5 MG immediate release tablet Take 2 tablets (10 mg total) by mouth every 12 (twelve) hours as needed for severe pain. 60 tablet 0  . oxyCODONE (OXYCONTIN) 15 mg 12 hr tablet Take 2 tablets (30 mg total) by mouth every 12 (twelve) hours. 30 tablet 0  . oxyCODONE-acetaminophen (PERCOCET) 10-325 MG tablet Take 1 tablet by mouth every 4 (four) hours as needed for pain. 84 tablet 0  . potassium chloride (KLOR-CON) 10 MEQ tablet Take 10 mEq by mouth daily.    Marland Kitchen sulfamethoxazole-trimethoprim  (BACTRIM DS) 800-160 MG tablet Take 1 tablet by mouth daily. 30 tablet 5  . prochlorperazine (COMPAZINE) 10 MG tablet Take 1 tablet (10 mg total) by mouth every 6 (six) hours as needed for nausea or vomiting. (Patient not taking: Reported on 09/09/2020) 30 tablet 2   No current facility-administered medications for this visit.    ALLERGIES:  Allergies  Allergen Reactions  . Neosporin [Neomycin-Bacitracin Zn-Polymyx] Rash    PHYSICAL EXAM:  Performance status (ECOG): 0 - Asymptomatic  Vitals:   09/09/20 0836  BP: (!) 102/50  Pulse: 77  Resp: 18  Temp: (!) 97.3 F (36.3 C)  SpO2: 99%   Wt Readings from Last 3 Encounters:  09/09/20 157 lb 3 oz (71.3 kg)  09/02/20 157 lb 14.4 oz (71.6 kg)  08/26/20 155 lb (70.3 kg)   Physical Exam Vitals reviewed.  Constitutional:      Appearance: Normal appearance.  Cardiovascular:     Rate and Rhythm: Normal rate and regular rhythm.     Pulses: Normal pulses.     Heart sounds: Normal heart sounds.  Pulmonary:     Effort: Pulmonary effort is normal.     Breath sounds: Normal breath sounds.  Chest:     Comments: Port-a-Cath in R chest Abdominal:     Palpations: Abdomen is soft. There is no mass.     Tenderness: There is no abdominal tenderness.  Neurological:     General: No focal deficit present.     Mental Status: He is alert and oriented to person, place, and time.  Psychiatric:        Mood and Affect: Mood normal.        Behavior: Behavior normal.     LABORATORY DATA:  I have reviewed the labs as listed.  CBC Latest Ref Rng & Units 09/09/2020 09/02/2020 08/26/2020  WBC 4.0 - 10.5 K/uL 4.3 5.1 6.8  Hemoglobin 13.0 - 17.0 g/dL 11.6(L) 11.9(L) 10.8(L)  Hematocrit 39 - 52 % 34.7(L) 35.5(L) 32.8(L)  Platelets 150 - 400 K/uL 158 203 247   CMP Latest Ref Rng & Units 09/09/2020 09/02/2020 08/26/2020  Glucose 70 - 99 mg/dL 86 89 108(H)  BUN 6 -  20 mg/dL 24(H) 21(H) 31(H)  Creatinine 0.61 - 1.24 mg/dL 1.00 1.03 0.88  Sodium 135 -  145 mmol/L 135 136 138  Potassium 3.5 - 5.1 mmol/L 3.4(L) 3.2(L) 4.1  Chloride 98 - 111 mmol/L 101 99 100  CO2 22 - 32 mmol/L '26 26 26  ' Calcium 8.9 - 10.3 mg/dL 8.4(L) 9.1 8.9  Total Protein 6.5 - 8.1 g/dL 6.1(L) 6.9 7.1  Total Bilirubin 0.3 - 1.2 mg/dL 0.6 0.3 0.2(L)  Alkaline Phos 38 - 126 U/L 56 70 78  AST 15 - 41 U/L 25 22 12(L)  ALT 0 - 44 U/L '27 22 12   ' Lab Results  Component Value Date   LDH 209 (H) 09/09/2020   LDH 162 09/02/2020   LDH 240 (H) 08/26/2020    DIAGNOSTIC IMAGING:  I have independently reviewed the scans and discussed with the patient. CT Biopsy  Result Date: 08/15/2020 INDICATION: 39 year old male with a history of most likely lymphoma, referred for biopsy EXAM: CT BIOPSY MEDICATIONS: None. ANESTHESIA/SEDATION: Moderate (conscious) sedation was employed during this procedure. A total of Versed 1.5 mg and Fentanyl 50 mcg was administered intravenously. Moderate Sedation Time: 10 minutes. The patient's level of consciousness and vital signs were monitored continuously by radiology nursing throughout the procedure under my direct supervision. FLUOROSCOPY TIME:  CT COMPLICATIONS: None PROCEDURE: Informed written consent was obtained from the patient after a thorough discussion of the procedural risks, benefits and alternatives. All questions were addressed. Maximal Sterile Barrier Technique was utilized including caps, mask, sterile gowns, sterile gloves, sterile drape, hand hygiene and skin antiseptic. A timeout was performed prior to the initiation of the procedure. Patient positioned supine position on CT gantry table. Scout CT was acquired for planning purposes. We targeted the sub a Paddock/gastrohepatic ligament mass for biopsy, in the subxiphoid region. Once the patient is prepped and draped in the usual sterile fashion, 1% lidocaine was used for local anesthesia. We then advanced trocar needle under CT guidance and multiple 18 gauge core biopsy were acquired. Needle  was removed and a final image was stored. Patient tolerated the procedure well and remained hemodynamically stable throughout. No complications were encountered and no significant blood loss. IMPRESSION: Status post CT-guided biopsy of upper abdominal peritoneal mass. Signed, Dulcy Fanny. Dellia Nims, RPVI [facility] Electronically Signed   By: Corrie Mckusick D.O.   On: 08/15/2020 13:08   ECHOCARDIOGRAM COMPLETE  Result Date: 08/22/2020    ECHOCARDIOGRAM REPORT   Patient Name:   Richard Cox Date of Exam: 08/22/2020 Medical Rec #:  [id number]  Height:       73.0 in Accession #:    [id number] Weight:       153.8 lb Date of Birth:  1981/10/17   BSA:          1.924 m Patient Age:    39 years   BP:           148/87 mmHg Patient Gender: M          HR:           77 bpm. Exam Location:  [facility] Procedure: 2D Echo, Cardiac Doppler and Color Doppler Indications:    Chemotherapy evaluation v87.41 / v58.11  History:        Patient has prior history of Echocardiogram examinations, most                 recent 09/18/2019. CHF; Risk Factors:Hypertension. AIDS (acquired  immune deficiency syndrome), Alcohol abuse, Polysubstance abuse                 -cocaine use in remission, Secondary cardiomyopathy ([place]) (From                 Hx).  Sonographer:    Alvino Chapel RCS Referring Phys: [phone] [place]  1. Left ventricular ejection fraction, by estimation, is 45 to 50%. The left ventricle has mildly decreased function. The left ventricle demonstrates global hypokinesis. There is severe left ventricular hypertrophy, septum greater than posterior wall. Left ventricular diastolic parameters are indeterminate. Consistent with hypertrophic cardiomyoapthy, although myocardial acoustic appearance suggests possible infiltrative cardiomyopathy.  2. Right ventricular systolic function is normal. The right ventricular size is normal. Moderately  increased right ventricular wall thickness. Tricuspid regurgitation signal is inadequate for assessing PA pressure.  3. Left atrial size was moderately dilated.  4. The mitral valve is grossly normal. No evidence of mitral valve regurgitation.  5. The aortic valve is tricuspid. Aortic valve regurgitation is not visualized.  6. The inferior vena cava is normal in size with greater than 50% respiratory variability, suggesting right atrial pressure of 3 mmHg. FINDINGS  Left Ventricle: Left ventricular ejection fraction, by estimation, is 45 to 50%. The left ventricle has mildly decreased function. The left ventricle demonstrates global hypokinesis. The left ventricular internal cavity size was normal in size. There is  severe left ventricular hypertrophy. Left ventricular diastolic parameters are indeterminate. Right Ventricle: The right ventricular size is normal. Moderately increased right ventricular wall thickness. Right ventricular systolic function is normal. Tricuspid regurgitation signal is inadequate for assessing PA pressure. Left Atrium: Left atrial size was moderately dilated. Right Atrium: Right atrial size was normal in size. Pericardium: There is no evidence of pericardial effusion. Mitral Valve: The mitral valve is grossly normal. No evidence of mitral valve regurgitation. Tricuspid Valve: The tricuspid valve is grossly normal. Tricuspid valve regurgitation is trivial. Aortic Valve: The aortic valve is tricuspid. Aortic valve regurgitation is not visualized. Pulmonic Valve: The pulmonic valve was grossly normal. Pulmonic valve regurgitation is trivial. Aorta: The aortic root is normal in size and structure. Venous: The inferior vena cava is normal in size with greater than 50% respiratory variability, suggesting right atrial pressure of 3 mmHg. IAS/Shunts: No atrial level shunt detected by color flow Doppler.  LEFT VENTRICLE PLAX 2D LVIDd:         4.23 cm  Diastology LVIDs:         3.27 cm  LV e'  medial:    4.13 cm/s LV PW:         2.29 cm  LV E/e' medial:  14.8 LV IVS:        3.30 cm  LV e' lateral:   7.40 cm/s LVOT diam:     2.50 cm  LV E/e' lateral: 8.3 LV SV:         126 LV SV Index:   66 LVOT Area:     4.91 cm  RIGHT VENTRICLE RV S prime:     12.60 cm/s TAPSE (M-mode): 2.2 cm LEFT ATRIUM             Index       RIGHT ATRIUM           Index LA diam:        5.10 cm 2.65 cm/m  RA Area:     16.20 cm LA Vol (A2C):   88.5 ml 45.99 ml/m RA Volume:  42.00 ml  21.83 ml/m LA Vol (A4C):   88.4 ml 45.94 ml/m LA Biplane Vol: 90.6 ml 47.08 ml/m  AORTIC VALVE LVOT Vmax:   143.00 cm/s LVOT Vmean:  83.200 cm/s LVOT VTI:    0.257 m  AORTA Ao Root diam: 3.40 cm MITRAL VALVE MV Area (PHT): 3.51 cm    SHUNTS MV Decel Time: 216 msec    Systemic VTI:  0.26 m MV E velocity: 61.30 cm/s  Systemic Diam: 2.50 cm MV A velocity: 58.50 cm/s MV E/A ratio:  1.05 Rozann Lesches MD Electronically signed by Rozann Lesches MD Signature Date/Time: 08/22/2020/12:19:50 PM    Final    IR IMAGING GUIDED PORT INSERTION  Result Date: 08/23/2020 INDICATION: 39 year old male with history of lymphoma requiring central venous access for chemotherapy. EXAM: IMPLANTED PORT A CATH PLACEMENT WITH ULTRASOUND AND FLUOROSCOPIC GUIDANCE COMPARISON:  None. MEDICATIONS: Ancef 2 gm IV; The antibiotic was administered within an appropriate time interval prior to skin puncture. ANESTHESIA/SEDATION: Moderate (conscious) sedation was employed during this procedure. A total of Versed 2 mg and Fentanyl 100 mcg was administered intravenously. Moderate Sedation Time: 25 minutes. The patient's level of consciousness and vital signs were monitored continuously by radiology nursing throughout the procedure under my direct supervision. CONTRAST:  None FLUOROSCOPY TIME:  0 minutes, 12 seconds (1 mGy) COMPLICATIONS: None immediate. PROCEDURE: The procedure, risks, benefits, and alternatives were explained to the patient. Questions regarding the procedure were  encouraged and answered. The patient understands and consents to the procedure. The right neck and chest were prepped with chlorhexidine in a sterile fashion, and a sterile drape was applied covering the operative field. Maximum barrier sterile technique with sterile gowns and gloves were used for the procedure. A timeout was performed prior to the initiation of the procedure. Ultrasound was used to examine the jugular vein which was compressible and free of internal echoes. A skin marker was used to demarcate the planned venotomy and port pocket incision sites. Local anesthesia was provided to these sites and the subcutaneous tunnel track with 1% lidocaine with 1:100,000 epinephrine. A small incision was created at the jugular access site and blunt dissection was performed of the subcutaneous tissues. Under real time ultrasound guidance, the jugular vein was accessed with a 21 ga micropuncture needle and an 0.018" wire was inserted to the superior vena cava. A 5 Fr micopuncture set was then used, through which a 0.035" Rosen wire was passed under fluoroscopic guidance into the inferior vena cava. An 8 Fr dilator was then placed over the wire. A subcutaneous port pocket was then created along the upper chest wall utilizing a combination of sharp and blunt dissection. The pocket was irrigated with sterile saline, packed with gauze, and observed for hemorrhage. A single lumen power injectable port was chosen for placement. The 8 Fr catheter was tunneled from the port pocket site to the venotomy incision. The port was placed in the pocket. The external catheter was trimmed to appropriate length. The dilator was exchanged for an 8 Fr peel-away sheath under fluoroscopic guidance. The catheter was then placed through the sheath and the sheath was removed. Final catheter positioning was confirmed and documented with a fluoroscopic spot radiograph. The port was accessed with a Huber needle, aspirated, and flushed with  heparinized saline. The deep dermal layer of the port pocket incision was closed with interrupted 3-0 Vicryl suture. The skin was opposed with a running subcuticular 4-0 Monocryl suture. Dermabond was then placed over the port pocket and neck  incisions. The patient tolerated the procedure well without immediate post procedural complication. FINDINGS: After catheter placement, the tip lies within the cavoatrial junction the catheter aspirates and flushes normally and is ready for immediate use. IMPRESSION: Successful placement of a power injectable Port-A-Cath via the right internal jugular vein. The catheter is ready for immediate use. Ruthann Cancer, MD [facility] Electronically Signed   By: Ruthann Cancer MD   On: 08/23/2020 10:09     ASSESSMENT:  1.B-cell high-grade lymphoma: -Presentation to the ER with left upper quadrant and mid abdominal pain for 2 weeks. -10 pound weight loss in the last 2 weeks due to decreased appetite. Denies any fevers or night sweats. -CT AP with contrast on 07/24/2020 shows 13.8 11.2 cm mass in the epigastric region, lobulated large mass in the mesenteric region, 9.3 x 5.1 cm mass in the inferior pelvis, 9.7 x 4.1 cm mass in the superior pelvis. Epigastric mass seems to abut posterior spleen. -LDH was elevated at 309, uric acid 10.2. -PET scan on 07/31/2020 showed extensive multifocal soft tissue masses within the abdomen and pelvis, most of them between SUV 15 and 20. FDG avid subpleural nodules overlying the right lung. Splenomegaly. Multiple scattered lung nodules, nonspecific. -Biopsy of the soft tissue mass in the upper abdomen on 08/15/2020 consistent with high-grade B-cell lymphoma. IHC positive for CD20, CD10, BCL6, negative for BCL-2, CD3, CD5, CD30, mum 1 and EBV. Ki-67 is 80-90%. Differential diagnosis includes Burkitt's lymphoma another large B-cell lymphoma's.  2. Social/family history: -He quit  smoking last year. -Family history significant for mother and father who had multiple myeloma. Grandfather had colon cancer.  3. 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  4. CHF: -Echocardiogram on 08/22/2020 with EF 45-50%. -Echocardiogram on 09/18/2019 shows EF 40-45%. -He is on Coreg 6.25 mg twice daily, Imdur 30 mg daily. Lasix 40 mg is an expired medication list.   PLAN:  1.High-grade B cell lymphoma: -High risk FISH panel is pending. -He was evaluated by Dr. Lonia Blood at [facility]  He was felt not a candidate for clinical trial. -Received cycle 1 R-EPOCH last week. -Tolerated well. -Reviewed his lab work from today-potassium 3.4, albumin 3.1, hemoglobin 11.6, uric acid 5.0, LDH 209. -Continue prednisone for 5 days at 100 mg flat dose. -Repeat echocardiogram prior to cycle 2. -No evidence of tumor lysis.  Lab work shows a corrected calcium of 8.7, potassium 3.4, normal uric acid and phosphorus level.  Kidney function at baseline. -Proceed with Udenyca injection today.  We did discuss side effects of the Udenyca.  I recommend he take Tylenol and Claritin for the next 3 days.  2. Abdominal pain: -Pain improved with steroids. -Continue oxycodone to be taken as needed.  3. Nausea: -Continue Zofran ODT 4 mg every 8 hours as needed.  4. Hyperuricemia: -Continue allopurinol 300 mg daily.  He was given rasburicase 6 mg IV with his treatment last week.  5. Hypokalemia: -Increase potassium supplements (10 meq) to twice daily for the next 5 days. -Potassium 3.4. -Given he is having some diarrhea, would recommend the increase for the next few days.   6.  Diarrhea X 1 day: -Unclear etiology-likely multifactorial secondary to chemo agents and use of stool softeners. -If diarrhea is persistent would recommend stool collection to rule out C. difficile and or other infection/bacteria. -Several of the chemo agents can cause diarrhea,  colitis and enteritis.  -I have  asked that he monitor this and let  us know if this worsens. -I have also asked that he hydrate.    Disposition: Return to clinic as scheduled on 09/16/2020 for repeat echo. Return to clinic on 09/23/2020 for lab work, MD assessment prior to cycle 2 R-EPOCH.   Orders placed this encounter:  No orders of the defined types were placed in this encounter.  Greater than 50% was spent in counseling and coordination of care with this patient including but not limited to discussion of the relevant topics above (See A&P) including, but not limited to diagnosis and management of acute and chronic medical conditions.   Faythe Casa, NP 09/09/2020 3:28 PM

## 2020-09-10 ENCOUNTER — Telehealth: Payer: Self-pay

## 2020-09-10 ENCOUNTER — Inpatient Hospital Stay (HOSPITAL_COMMUNITY): Payer: 59 | Admitting: General Practice

## 2020-09-10 ENCOUNTER — Telehealth (HOSPITAL_COMMUNITY): Payer: Self-pay | Admitting: General Practice

## 2020-09-10 NOTE — Telephone Encounter (Signed)
Left message to see if interested in counseling.   Gaylyn Rong Counseling Intern

## 2020-09-10 NOTE — Telephone Encounter (Signed)
[place] Work  Initial Assessment   Richard Cox is a 39 y.o. year old male . Clinical Social Work was referred by nurse navigator M Dishmon for assessment of psychosocial needs.   SDOH (Social Determinants of Health) assessments performed: Yes  Distress Screen completed: No   Family/Social Information:  . Housing Arrangement: patient lives with parents, can stay as long as he needs.  Prior to that was living w brother . Family members/support persons in your life? Parents are both retired and very helpful . Transportation concerns: drives his own car except when he needs help, family helps as needed . 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  Income source: short term disability, has insurance through employer; will check w HR about long term disability . Financial concerns: Yes, due to illness and/or loss of work during treatment o Type of concern: Medical bills and loans and living expenses . Food access concerns: none at this time . Religious or spiritual practice: {none, used to attend online . Medication Concerns: none at this time . Services Currently in place:  None  Coping/ Adjustment to diagnosis: . Patient understands treatment plan and what happens next? Newly diagnosed after having severe stomach pains, mass near pancreas found, diagnosed with Stage 3 lymphoma.  Will do one week of chemotherapy at home then 2 weeks off, then repeat this cycle for 4 treatment (approx 12).   Tolerated initial treatment well although had dizziness and nausea, has had reduction in pain.   . Concerns about diagnosis and/or treatment: Losing my job, How I will pay for the services I need and pain has subsided while on chemo.  Tries to breathe and stay active to manage anxiety. . Patient reported stressors: Work/ school, Finances, Anxiety and Adjusting to my illness . Hopes and priorities:  beating cancer, focusing on his writing, has thought about doing online school "because I have the time", trying to find focus . Patient enjoys exercise, time with family/ friends and exploring options for online learning . Current coping skills/ strengths: Motivation for treatment/growth, Supportive family/friends and Other: has connected w another young person who is in treatment for lung cancer    SUMMARY: Current SDOH Barriers:  . Financial constraints related to income reduction, inability to work  .  Interventions: . Discussed common feeling and emotions when being diagnosed with cancer, and the importance of support during treatment . Informed patient of the support team roles and support services at [facility] . Provided CSW contact information and encouraged patient to call with any questions or concerns . Patient interviewed and appropriate assessments performed   Follow Up Plan: Client will explore disability and health insurance coverage options through his employer,  Reach out to friend who has used employer covered options for counseling.     Referral made to [facility] counseling intern for support - he prefers to connect w someone not affiliated with his insurance at this time.  Email sent w resources for young people diagnosed w cancer as well as [place] and [employer].   Follow up call in two weeks.   Patient verbalizes understanding of plan: Yes    Beverely Pace , [place], LCSW Clinical Social Worker Phone:  [phone]

## 2020-09-10 NOTE — Telephone Encounter (Signed)
[employer] Work  Initial Assessment   Richard Cox is a 39 y.o. year old male . Clinical Social Work was referred by nurse navigator M Dishmon for assessment of psychosocial needs.   SDOH (Social Determinants of Health) assessments performed: Yes  Distress Screen completed: No   Family/Social Information:  . Housing Arrangement: patient lives with parents, can stay as long as he needs.  Prior to that was living w brother . Family members/support persons in your life? Parents are both retired and very helpful . Transportation concerns: drives his own car except when he needs help, family helps as needed . 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  Income source: short term disability, has insurance through employer; will check w HR about long term disability . Financial concerns: Yes, due to illness and/or loss of work during treatment o Type of concern: Medical bills and loans and living expenses . Food access concerns: none at this time . Religious or spiritual practice: {none, used to attend online . Medication Concerns: none at this time . Services Currently in place:  None  Coping/ Adjustment to diagnosis: . Patient understands treatment plan and what happens next? Newly diagnosed after having severe stomach pains, mass near pancreas found, diagnosed with Stage 3 lymphoma.  Will do one week of chemotherapy at home then 2 weeks off, then repeat this cycle for 4 treatment (approx 12).   Tolerated initial treatment well although had dizziness and nausea, has had reduction in pain.   . Concerns about diagnosis and/or treatment: Losing my job, How I will pay for the services I need and pain has subsided while on chemo.  Tries to breathe and stay active to manage anxiety. . Patient reported stressors: Work/ school, Finances, Anxiety and Adjusting to my illness . Hopes and priorities:  beating cancer, focusing on his writing, has thought about doing online school "because I have the time", trying to find focus . Patient enjoys exercise, time with family/ friends and exploring options for online learning . Current coping skills/ strengths: Motivation for treatment/growth, Supportive family/friends and Other: has connected w another young person who is in treatment for lung cancer    SUMMARY: Current SDOH Barriers:  . Financial constraints related to income reduction, inability to work  .  Interventions: . Discussed common feeling and emotions when being diagnosed with cancer, and the importance of support during treatment . Informed patient of the support team roles and support services at [facility] . Provided CSW contact information and encouraged patient to call with any questions or concerns . Patient interviewed and appropriate assessments performed   Follow Up Plan: Client will explore disability and health insurance coverage options through his employer,  Reach out to friend who has used employer covered options for counseling.     Referral made to [facility] counseling intern for support - he prefers to connect w someone not affiliated with his insurance at this time.  Email sent w resources for young people diagnosed w cancer as well as [place] and [employer].   Follow up call in two weeks.   Patient verbalizes understanding of plan: Yes    Beverely Pace , [place], LCSW Clinical Social Worker Phone:  [phone]

## 2020-09-16 ENCOUNTER — Other Ambulatory Visit: Payer: Self-pay

## 2020-09-16 ENCOUNTER — Other Ambulatory Visit (HOSPITAL_COMMUNITY): Payer: Self-pay | Admitting: Hematology

## 2020-09-16 ENCOUNTER — Other Ambulatory Visit (HOSPITAL_COMMUNITY): Payer: Self-pay | Admitting: Surgery

## 2020-09-16 ENCOUNTER — Ambulatory Visit (HOSPITAL_COMMUNITY)
Admission: RE | Admit: 2020-09-16 | Discharge: 2020-09-16 | Disposition: A | Discharge status: Home / Self Care | Payer: 59 | Source: Ambulatory Visit | Attending: Hematology | Admitting: Hematology

## 2020-09-16 ENCOUNTER — Telehealth (HOSPITAL_COMMUNITY): Payer: Self-pay | Admitting: *Deleted

## 2020-09-16 ENCOUNTER — Other Ambulatory Visit (HOSPITAL_COMMUNITY): Payer: Self-pay

## 2020-09-16 DIAGNOSIS — C8510 Unspecified B-cell lymphoma, unspecified site: Secondary | ICD-10-CM

## 2020-09-16 DIAGNOSIS — Z0189 Encounter for other specified special examinations: Secondary | ICD-10-CM

## 2020-09-16 LAB — ECHOCARDIOGRAM COMPLETE
Area-P 1/2: 3.5 cm2
Calc EF: 39.7 %
S' Lateral: 2.75 cm
Single Plane A2C EF: 39.6 %
Single Plane A4C EF: 43.7 %

## 2020-09-16 MED ORDER — OXYCODONE HCL 5 MG PO TABS: 10.0000 mg | ORAL_TABLET | Freq: Two times a day (BID) | ORAL | 0 refills | Status: DC | PRN

## 2020-09-16 MED FILL — oxyCODONE HCL 5 MG TABS: 5 | 30 days supply | Qty: 120 | Fill #0

## 2020-09-16 NOTE — Progress Notes (Signed)
*  PRELIMINARY RESULTS* Echocardiogram 2D Echocardiogram has been performed.  Richard Cox 09/16/2020, 10:28 AM

## 2020-09-23 ENCOUNTER — Inpatient Hospital Stay (HOSPITAL_BASED_OUTPATIENT_CLINIC_OR_DEPARTMENT_OTHER): Payer: 59 | Admitting: Hematology

## 2020-09-23 ENCOUNTER — Other Ambulatory Visit (HOSPITAL_COMMUNITY): Payer: Self-pay | Admitting: *Deleted

## 2020-09-23 ENCOUNTER — Inpatient Hospital Stay (HOSPITAL_COMMUNITY): Payer: 59

## 2020-09-23 ENCOUNTER — Other Ambulatory Visit: Payer: Self-pay

## 2020-09-23 VITALS — BP 128/70 | HR 79 | Temp 97.2°F | Resp 18

## 2020-09-23 VITALS — BP 134/80 | HR 81 | Temp 96.8°F | Resp 18

## 2020-09-23 DIAGNOSIS — C8510 Unspecified B-cell lymphoma, unspecified site: Secondary | ICD-10-CM

## 2020-09-23 DIAGNOSIS — Z5111 Encounter for antineoplastic chemotherapy: Secondary | ICD-10-CM | POA: Diagnosis not present

## 2020-09-23 LAB — CBC WITH DIFFERENTIAL/PLATELET
Abs Immature Granulocytes: 0.09 10*3/uL — ABNORMAL HIGH (ref 0.00–0.07)
Basophils Absolute: 0 10*3/uL (ref 0.0–0.1)
Basophils Relative: 0 %
Eosinophils Absolute: 0 10*3/uL (ref 0.0–0.5)
Eosinophils Relative: 0 %
HCT: 33.9 % — ABNORMAL LOW (ref 39.0–52.0)
Hemoglobin: 10.8 g/dL — ABNORMAL LOW (ref 13.0–17.0)
Immature Granulocytes: 1 %
Lymphocytes Relative: 17 %
Lymphs Abs: 1.4 10*3/uL (ref 0.7–4.0)
MCH: 28.7 pg (ref 26.0–34.0)
MCHC: 31.9 g/dL (ref 30.0–36.0)
MCV: 90.2 fL (ref 80.0–100.0)
Monocytes Absolute: 0.5 10*3/uL (ref 0.1–1.0)
Monocytes Relative: 6 %
Neutro Abs: 6 10*3/uL (ref 1.7–7.7)
Neutrophils Relative %: 76 %
Platelets: 161 10*3/uL (ref 150–400)
RBC: 3.76 MIL/uL — ABNORMAL LOW (ref 4.22–5.81)
RDW: 20.2 % — ABNORMAL HIGH (ref 11.5–15.5)
WBC: 8 10*3/uL (ref 4.0–10.5)
nRBC: 0.3 % — ABNORMAL HIGH (ref 0.0–0.2)

## 2020-09-23 LAB — COMPREHENSIVE METABOLIC PANEL
ALT: 17 U/L (ref 0–44)
AST: 20 U/L (ref 15–41)
Albumin: 3.7 g/dL (ref 3.5–5.0)
Alkaline Phosphatase: 92 U/L (ref 38–126)
Anion gap: 11 (ref 5–15)
BUN: 13 mg/dL (ref 6–20)
CO2: 22 mmol/L (ref 22–32)
Calcium: 8.8 mg/dL — ABNORMAL LOW (ref 8.9–10.3)
Chloride: 105 mmol/L (ref 98–111)
Creatinine, Ser: 1.23 mg/dL (ref 0.61–1.24)
GFR, Estimated: >60 mL/min (ref >60)
Glucose, Bld: 171 mg/dL — ABNORMAL HIGH (ref 70–99)
Potassium: 3.4 mmol/L — ABNORMAL LOW (ref 3.5–5.1)
Sodium: 138 mmol/L (ref 135–145)
Total Bilirubin: 0.2 mg/dL — ABNORMAL LOW (ref 0.3–1.2)
Total Protein: 7.1 g/dL (ref 6.5–8.1)

## 2020-09-23 LAB — URIC ACID: Uric Acid, Serum: 5 mg/dL (ref 3.7–8.6)

## 2020-09-23 LAB — PHOSPHORUS: Phosphorus: 2.8 mg/dL (ref 2.5–4.6)

## 2020-09-23 LAB — LACTATE DEHYDROGENASE: LDH: 168 U/L (ref 98–192)

## 2020-09-23 MED ORDER — METHYLPREDNISOLONE SODIUM SUCC 125 MG IJ SOLR
125.0000 mg | Freq: Once | INTRAMUSCULAR | Status: AC
  Administered 2020-09-23: 125 mg via INTRAVENOUS
  Filled 2020-09-23: qty 2

## 2020-09-23 MED ORDER — VINCRISTINE SULFATE CHEMO INJECTION 1 MG/ML
Freq: Once | INTRAVENOUS | Status: AC
  Filled 2020-09-23: qty 10

## 2020-09-23 MED ORDER — SODIUM CHLORIDE 0.9 % IV SOLN
375.0000 mg/m2 | Freq: Once | INTRAVENOUS | Status: AC
  Administered 2020-09-23: 700 mg via INTRAVENOUS
  Filled 2020-09-23: qty 50

## 2020-09-23 MED ORDER — FAMOTIDINE IN NACL 20-0.9 MG/50ML-% IV SOLN: 20.0000 mg | Freq: Once | INTRAVENOUS | Status: DC

## 2020-09-23 MED ORDER — SODIUM CHLORIDE 0.9 % IV SOLN
40.0000 mg | Freq: Once | INTRAVENOUS | Status: AC
  Administered 2020-09-23: 40 mg via INTRAVENOUS
  Filled 2020-09-23: qty 4

## 2020-09-23 MED ORDER — SODIUM CHLORIDE 0.9 % IV SOLN
Freq: Once | INTRAVENOUS | Status: AC
  Administered 2020-09-23: 8 mg via INTRAVENOUS
  Filled 2020-09-23: qty 4

## 2020-09-23 MED ORDER — POTASSIUM CHLORIDE CRYS ER 20 MEQ PO TBCR
10.0000 meq | EXTENDED_RELEASE_TABLET | Freq: Every day | ORAL | 1 refills | Status: DC
Start: 2020-09-23 — End: 2021-08-27

## 2020-09-23 MED ORDER — SODIUM CHLORIDE 0.9 % IV SOLN: INTRAVENOUS | Status: DC

## 2020-09-23 MED ORDER — FAMOTIDINE IN NACL 20-0.9 MG/50ML-% IV SOLN
INTRAVENOUS | Status: AC
  Filled 2020-09-23: qty 50

## 2020-09-23 MED ORDER — SODIUM CHLORIDE 0.9 % IV SOLN
40.0000 mg | Freq: Once | INTRAVENOUS | Status: DC
  Filled 2020-09-23: qty 4

## 2020-09-23 MED ORDER — DIPHENHYDRAMINE HCL 50 MG/ML IJ SOLN
50.0000 mg | Freq: Once | INTRAMUSCULAR | Status: AC
  Administered 2020-09-23: 50 mg via INTRAVENOUS
  Filled 2020-09-23: qty 1

## 2020-09-23 MED ORDER — DIPHENHYDRAMINE HCL 50 MG/ML IJ SOLN
INTRAMUSCULAR | Status: AC
  Filled 2020-09-23: qty 1

## 2020-09-23 MED ORDER — ACETAMINOPHEN 325 MG PO TABS
650.0000 mg | ORAL_TABLET | Freq: Once | ORAL | Status: AC
  Administered 2020-09-23: 650 mg via ORAL
  Filled 2020-09-23: qty 2

## 2020-09-23 MED ORDER — SODIUM CHLORIDE 0.9% FLUSH: 10.0000 mL | INTRAVENOUS | Status: DC | PRN

## 2020-09-23 MED ORDER — ACETAMINOPHEN 325 MG PO TABS
ORAL_TABLET | ORAL | Status: AC
  Filled 2020-09-23: qty 2

## 2020-09-23 NOTE — Progress Notes (Signed)
Richard Cox presents today for D1C2 R-EPOCH. Pt denies any new changes or symptoms since last treatment. He states that he has actually had more energy than normal. Lab results and vitals have been reviewed and are stable and within parameters for treatment. Patient has been assessed by Dr. Delton Coombes who has approved proceeding with treatment today as planned.  Infusions tolerated without incident or complaint. VSS upon completion of treatment. Port infusing EPOCH via home infusion pump with RUN visible on screen, see MAR and IV flowsheet for details. Pt instructed to return at 1300 tomorrow for pump refill. Understanding verbalized. Discharged in satisfactory condition with follow up instructions.

## 2020-09-23 NOTE — Progress Notes (Signed)
Patient was assessed by Dr. Delton Coombes and labs have been reviewed.  Due to reaction during last treatment, we will not increase his rate as fast today per Dr. Delton Coombes. Patient is okay to proceed with treatment today. Primary RN and pharmacy aware.

## 2020-09-23 NOTE — Patient Instructions (Signed)
[facility] at [facility] Discharge Instructions  Labs drawn from portacath today   Thank you for choosing [facility] at [facility] to provide your oncology and hematology care.  To afford each patient quality time with our provider, please arrive at least 15 minutes before your scheduled appointment time.   If you have a lab appointment with the Cancer Center please come in thru the Main Entrance and check in at the main information desk.  You need to re-schedule your appointment should you arrive 10 or more minutes late.  We strive to give you quality time with our providers, and arriving late affects you and other patients whose appointments are after yours.  Also, if you no show three or more times for appointments you may be dismissed from the clinic at the providers discretion.     Again, thank you for choosing [facility].  Our hope is that these requests will decrease the amount of time that you wait before being seen by our physicians.       _____________________________________________________________  Should you have questions after your visit to [facility], please contact our office at [phone] and follow the prompts.  Our office hours are 8:00 a.m. and 4:30 p.m. Monday - Friday.  Please note that voicemails left after 4:00 p.m. may not be returned until the following business day.  We are closed weekends and major holidays.  You do have access to a nurse 24-7, just call the main number to the clinic [phone] and do not press any options, hold on the line and a nurse will answer the phone.    For prescription refill requests, have your pharmacy contact our office and allow 72 hours.    Due to Covid, you will need to wear a mask upon entering the hospital. If you do not have a mask, a mask will be given to you at the Main Entrance upon arrival. For doctor visits, patients may have 1 support person age 18  or older with them. For treatment visits, patients can not have anyone with them due to social distancing guidelines and our immunocompromised population.     

## 2020-09-23 NOTE — Patient Instructions (Signed)
[facility] Discharge Instructions for Patients Receiving Chemotherapy   Beginning January 23rd 2017 lab work for the [facility] will be done in the  Main lab at [facility] on 1st floor. If you have a lab appointment with the [place] please come in thru the  Main Entrance and check in at the main information desk   Today you received the following chemotherapy agents Rituxan and EPOCH  To help prevent nausea and vomiting after your treatment, we encourage you to take your nausea medication   If you develop nausea and vomiting, or diarrhea that is not controlled by your medication, call the clinic.  The clinic phone number is [phone]. Office hours are Monday-Friday 8:30am-5:00pm.  BELOW ARE SYMPTOMS THAT SHOULD BE REPORTED IMMEDIATELY:  *FEVER GREATER THAN 101.0 F  *CHILLS WITH OR WITHOUT FEVER  NAUSEA AND VOMITING THAT IS NOT CONTROLLED WITH YOUR NAUSEA MEDICATION  *UNUSUAL SHORTNESS OF BREATH  *UNUSUAL BRUISING OR BLEEDING  TENDERNESS IN MOUTH AND THROAT WITH OR WITHOUT PRESENCE OF ULCERS  *URINARY PROBLEMS  *BOWEL PROBLEMS  UNUSUAL RASH Items with * indicate a potential emergency and should be followed up as soon as possible. If you have an emergency after office hours please contact your primary care physician or go to the nearest emergency department.  Please call the clinic during office hours if you have any questions or concerns.   You may also contact the Patient Navigator at [phone] should you have any questions or need assistance in obtaining follow up care.      Resources For Cancer Patients and their Caregivers ? American Cancer Society: Can assist with transportation, wigs, general needs, runs Look Good Feel Better.        305-644-5950 ? Cancer Care: Provides financial assistance, online support groups, medication/co-pay assistance.  1-800-813-HOPE 763-845-2053) ? [name] Assists [place] cancer patients and their families through emotional , educational and financial support.  817-692-2451 ? [place] DSS Where to apply for food stamps, Medicaid and utility assistance. (431)812-5118 ? [employer]: Transportation to medical appointments. 380-785-2561 ? Social Security Administration: May apply for disability if have a Stage IV cancer. (615) 804-5154 (972) 705-0272 ? [employer], Disability and Transit Services: Assists with nutrition, care and transit needs. 8281112757

## 2020-09-23 NOTE — Patient Instructions (Addendum)
[place] at [facility] Discharge Instructions  You were seen today by Dr. Delton Coombes. He went over your recent results. You received your treatment today. You will be prescribed potassium 20 mEq to take daily. Dr. Delton Coombes will see you back in 3 weeks for labs and follow up.   Thank you for choosing [place] at [facility] to provide your oncology and hematology care.  To afford each patient quality time with our provider, please arrive at least 15 minutes before your scheduled appointment time.   If you have a lab appointment with the [place] please come in thru the Main Entrance and check in at the main information desk  You need to re-schedule your appointment should you arrive 10 or more minutes late.  We strive to give you quality time with our providers, and arriving late affects you and other patients whose appointments are after yours.  Also, if you no show three or more times for appointments you may be dismissed from the clinic at the providers discretion.     Again, thank you for choosing [facility].  Our hope is that these requests will decrease the amount of time that you wait before being seen by our physicians.       _____________________________________________________________  Should you have questions after your visit to [facility], please contact our office at [phone] between the hours of 8:00 a.m. and 4:30 p.m.  Voicemails left after 4:00 p.m. will not be returned until the following business day.  For prescription refill requests, have your pharmacy contact our office and allow 72 hours.    Cancer Center Support Programs:   > Cancer Support Group  2nd Tuesday of the month 1pm-2pm, Journey Room

## 2020-09-23 NOTE — Progress Notes (Signed)
Reaction with cycle #1 Rituximab.   Orders received to give solu-medrol 125 mg IVPush and increase famotidine to 40 mg IVPB x 1 as premedications.  T.O. Dr Rhys Martini, PharmD

## 2020-09-23 NOTE — Progress Notes (Signed)
[name], [place] [address]   CLINIC:  Medical Oncology/Hematology  PCP:  Lindell Spar, MD [address] / [place] [address] [phone]   REASON FOR VISIT:  Follow-up for high-grade B-cell lymphoma  PRIOR THERAPY: None  NGS Results: [employer] BCL6 and MYC rearrangement detected  CURRENT THERAPY: R-EPOCH every 3 weeks  BRIEF ONCOLOGIC HISTORY:  Oncology History  High grade B-cell lymphoma ([place])  08/21/2020 Initial Diagnosis   High grade B-cell lymphoma ([place])   09/02/2020 -  Chemotherapy   The patient had pegfilgrastim-cbqv (UDENYCA) injection 6 mg, 6 mg, Subcutaneous, Once, 2 of 4 cycles Administration: 6 mg (09/09/2020) DOXOrubicin (ADRIAMYCIN) 20 mg, etoposide (VEPESID) 96 mg, vinCRIStine (ONCOVIN) 0.8 mg in sodium chloride 0.9 % 500 mL chemo infusion, , Intravenous, Once, 2 of 4 cycles Administration:  (09/02/2020),  (09/03/2020),  (09/04/2020),  (09/05/2020) ondansetron (ZOFRAN) 8 mg, dexamethasone (DECADRON) 10 mg in sodium chloride 0.9 % 50 mL IVPB, , Intravenous,  Once, 2 of 4 cycles Administration: 8 mg (09/02/2020), 8 mg (09/03/2020), 16 mg (09/06/2020), 8 mg (09/04/2020), 8 mg (09/05/2020) cyclophosphamide (CYTOXAN) 1,420 mg in sodium chloride 0.9 % 250 mL chemo infusion, 750 mg/m2 = 1,420 mg, Intravenous,  Once, 2 of 4 cycles Administration: 1,420 mg (09/06/2020) riTUXimab-pvvr (RUXIENCE) 700 mg in sodium chloride 0.9 % 250 mL (2.1875 mg/mL) infusion, 375 mg/m2 = 700 mg, Intravenous,  Once, 2 of 4 cycles Administration: 700 mg (09/02/2020)  for chemotherapy treatment.    09/02/2020 Genetic Testing         CANCER STAGING: Cancer Staging No matching staging information was found for the patient.  INTERVAL HISTORY:  [name], a 39 y.o. male, returns for routine follow-up and consideration for next cycle of chemotherapy. Coren was last seen on 09/02/2020.  Due for cycle #2 of R-EPOCH today.    Overall, he tells me he has been feeling pretty well. He tolerated the previous treatment well and reports feeling slightly weak and nauseous with mild diarrhea; he takes Zofran for nausea. He reports having mild abdominal pain 1 week ago, but not to the extent before starting chemo. His need for oxycodone has decreased and has only taken 2-3 tablets since filling his new prescription on 11/8; he stopped taking MS Contin.  He has received both of his COVID vaccines, his second dose being in July.  Overall, he feels ready for next cycle of chemo today.    REVIEW OF SYSTEMS:  Review of Systems  Constitutional: Positive for appetite change (80%) and fatigue (50%).  HENT:   Positive for trouble swallowing (pills & liquids).   Gastrointestinal: Positive for diarrhea (mild after chemo) and nausea (mild after chemo).  Neurological: Positive for dizziness.  All other systems reviewed and are negative.   PAST MEDICAL/SURGICAL HISTORY:  Past Medical History:  Diagnosis Date   CHF (congestive heart failure) (HCC)    Essential hypertension    HIV infection ([place])    Noncompliance with medication regimen    Secondary cardiomyopathy ([place])    Past Surgical History:  Procedure Laterality Date   IR IMAGING GUIDED PORT INSERTION  08/23/2020   Right   NO PAST SURGERIES      SOCIAL HISTORY:  Social History   Socioeconomic History   Marital status: Single    Spouse name: Not on file   Number of children: 0   Years of education: 13   Highest education level: Not on file  Occupational History  Not on file  Tobacco Use   Smoking status: Light Tobacco Smoker    Packs/day: 0.25    Types: Cigarettes   Smokeless tobacco: Never Used  Vaping Use   Vaping Use: Never used  Substance and Sexual Activity   Alcohol use: Not Currently   Drug use: Not Currently    Types: Marijuana    Comment: x 1 week   Sexual activity: Not Currently    Comment: pt given condoms  Other  Topics Concern   Not on file  Social History Narrative   Not on file   Social Determinants of Health   Financial Resource Strain: Low Risk    Difficulty of Paying Living Expenses: Not very hard  Food Insecurity: No Food Insecurity   Worried About Charity fundraiser in the Last Year: Never true   Ran Out of Food in the Last Year: Never true  Transportation Needs: No Transportation Needs   Lack of Transportation (Medical): No   Lack of Transportation (Non-Medical): No  Physical Activity: Insufficiently Active   Days of Exercise per Week: 5 days   Minutes of Exercise per Session: 20 min  Stress: No Stress Concern Present   Feeling of Stress : Only a little  Social Connections: Socially Isolated   Frequency of Communication with Friends and Family: More than three times a week   Frequency of Social Gatherings with Friends and Family: Never   Attends Religious Services: Never   Printmaker: No   Attends Music therapist: Not on file   Marital Status: Never married  Human resources officer Violence:    Fear of Current or Ex-Partner: Not on file   Emotionally Abused: Not on file   Physically Abused: Not on file   Sexually Abused: Not on file    FAMILY HISTORY:  Family History  Problem Relation Age of Onset   Diabetes Mellitus II Mother    Cancer Mother        multiple myeloma   Cancer Father        multiple myeloma   Congestive Heart Failure Brother    Heart Problems Brother    Lupus Brother     CURRENT MEDICATIONS:  Current Outpatient Medications  Medication Sig Dispense Refill   acetaminophen (TYLENOL) 500 MG tablet Take 1,000 mg by mouth every 6 (six) hours as needed for moderate pain or headache.     allopurinol (ZYLOPRIM) 300 MG tablet Take 1 tablet (300 mg total) by mouth daily. 30 tablet 3   bictegravir-emtricitabine-tenofovir AF (BIKTARVY) 50-200-25 MG TABS tablet Take 1 tablet by mouth daily. 30  tablet 5   carvedilol (COREG) 6.25 MG tablet Take 1 tablet (6.25 mg total) by mouth 2 (two) times daily with a meal. For BP and Heart (Patient taking differently: Take 6.25 mg by mouth 2 (two) times daily with a meal. ) 60 tablet 11   dicyclomine (BENTYL) 10 MG capsule      docusate sodium (COLACE) 100 MG capsule Take 100 mg by mouth daily as needed for mild constipation.     furosemide (LASIX) 40 MG tablet Take 1 tablet (40 mg total) by mouth daily. 30 tablet 3   hydrALAZINE (APRESOLINE) 50 MG tablet Take 50 mg by mouth 3 (three) times daily.     isosorbide mononitrate (IMDUR) 30 MG 24 hr tablet Take 30 mg by mouth daily.     losartan (COZAAR) 50 MG tablet Take 1 tablet (50 mg total) by mouth daily.  90 tablet 3   oxyCODONE (OXY IR/ROXICODONE) 5 MG immediate release tablet Take 2 tablets (10 mg total) by mouth every 12 (twelve) hours as needed for severe pain. 120 tablet 0   potassium chloride (KLOR-CON) 10 MEQ tablet Take 10 mEq by mouth daily.     sulfamethoxazole-trimethoprim (BACTRIM DS) 800-160 MG tablet Take 1 tablet by mouth daily. 30 tablet 5   lidocaine-prilocaine (EMLA) cream Apply 1 application topically as needed. (Patient not taking: Reported on 09/23/2020) 30 g 0   ondansetron (ZOFRAN-ODT) 4 MG disintegrating tablet  (Patient not taking: Reported on 09/23/2020)     prochlorperazine (COMPAZINE) 10 MG tablet Take 1 tablet (10 mg total) by mouth every 6 (six) hours as needed for nausea or vomiting. (Patient not taking: Reported on 09/23/2020) 30 tablet 2   No current facility-administered medications for this visit.   Facility-Administered Medications Ordered in Other Visits  Medication Dose Route Frequency Provider Last Rate Last Admin   0.9 %  sodium chloride infusion   Intravenous Continuous Derek Jack, MD 20 mL/hr at 09/23/20 0929 New Bag at 09/23/20 0929   acetaminophen (TYLENOL) tablet 650 mg  650 mg Oral Once Derek Jack, MD        diphenhydrAMINE (BENADRYL) injection 50 mg  50 mg Intravenous Once Derek Jack, MD       DOXOrubicin (ADRIAMYCIN) 20 mg, etoposide (VEPESID) 96 mg, vinCRIStine (ONCOVIN) 0.8 mg in sodium chloride 0.9 % 500 mL chemo infusion   Intravenous Once Derek Jack, MD       famotidine (PEPCID) 40 mg in sodium chloride 0.9 % 100 mL IVPB  40 mg Intravenous Once Derek Jack, MD       methylPREDNISolone sodium succinate (SOLU-MEDROL) 125 mg/2 mL injection 125 mg  125 mg Intravenous Once Derek Jack, MD       ondansetron (ZOFRAN) 8 mg, dexamethasone (DECADRON) 10 mg in sodium chloride 0.9 % 50 mL IVPB   Intravenous Once Derek Jack, MD       riTUXimab-pvvr (RUXIENCE) 700 mg in sodium chloride 0.9 % 250 mL (2.1875 mg/mL) infusion  375 mg/m2 (Treatment Plan Recorded) Intravenous Once Derek Jack, MD       sodium chloride flush (NS) 0.9 % injection 10 mL  10 mL Intracatheter PRN Derek Jack, MD        ALLERGIES:  Allergies  Allergen Reactions   Neosporin [Neomycin-Bacitracin Zn-Polymyx] Rash    PHYSICAL EXAM:  Performance status (ECOG): 0 - Asymptomatic  Vitals:   09/23/20 0829  BP: 134/80  Pulse: 81  Resp: 18  Temp: (!) 96.8 F (36 C)  SpO2: 98%   Wt Readings from Last 3 Encounters:  09/09/20 157 lb 3 oz (71.3 kg)  09/02/20 157 lb 14.4 oz (71.6 kg)  08/26/20 155 lb (70.3 kg)   Physical Exam Vitals reviewed.  Constitutional:      Appearance: Normal appearance.  Cardiovascular:     Rate and Rhythm: Normal rate and regular rhythm.     Pulses: Normal pulses.     Heart sounds: Normal heart sounds.  Pulmonary:     Effort: Pulmonary effort is normal.     Breath sounds: Normal breath sounds.  Abdominal:     Palpations: Abdomen is soft. There is no hepatomegaly, splenomegaly or mass.     Tenderness: There is no abdominal tenderness.     Hernia: No hernia is present.  Lymphadenopathy:     Upper Body:     Right upper body: No  supraclavicular, axillary or pectoral  adenopathy.     Left upper body: No supraclavicular, axillary or pectoral adenopathy.     Lower Body: No right inguinal adenopathy. No left inguinal adenopathy.  Neurological:     General: No focal deficit present.     Mental Status: He is alert and oriented to person, place, and time.  Psychiatric:        Mood and Affect: Mood normal.        Behavior: Behavior normal.      LABORATORY DATA:  I have reviewed the labs as listed.  CBC Latest Ref Rng & Units 09/23/2020 09/09/2020 09/02/2020  WBC 4.0 - 10.5 K/uL 8.0 4.3 5.1  Hemoglobin 13.0 - 17.0 g/dL 10.8(L) 11.6(L) 11.9(L)  Hematocrit 39 - 52 % 33.9(L) 34.7(L) 35.5(L)  Platelets 150 - 400 K/uL 161 158 203   CMP Latest Ref Rng & Units 09/23/2020 09/09/2020 09/02/2020  Glucose 70 - 99 mg/dL 171(H) 86 89  BUN 6 - 20 mg/dL 13 24(H) 21(H)  Creatinine 0.61 - 1.24 mg/dL 1.23 1.00 1.03  Sodium 135 - 145 mmol/L 138 135 136  Potassium 3.5 - 5.1 mmol/L 3.4(L) 3.4(L) 3.2(L)  Chloride 98 - 111 mmol/L 105 101 99  CO2 22 - 32 mmol/L _0 Calcium 8.9 - 10.3 mg/dL 8.8(L) 8.4(L) 9.1  Total Protein 6.5 - 8.1 g/dL 7.1 6.1(L) 6.9  Total Bilirubin 0.3 - 1.2 mg/dL 0.2(L) 0.6 0.3  Alkaline Phos 38 - 126 U/L 92 56 70  AST 15 - 41 U/L _1 ALT 0 - 44 U/L _2 Lab Results  Component Value Date   LDH 168 09/23/2020   LDH 209 (H) 09/09/2020   LDH 162 09/02/2020    DIAGNOSTIC IMAGING:  I have independently reviewed the scans and discussed with the patient. ECHOCARDIOGRAM COMPLETE  Result Date: 09/16/2020    ECHOCARDIOGRAM REPORT   Patient Name:   Richard Cox Date of Exam: 09/16/2020 Medical Rec #:  [id number]  Height:       73.0 in Accession #:    [id number] Weight:       157.2 lb Date of Birth:  06-29-1981   BSA:          1.942 m Patient Age:    57 years   BP:           121/72 mmHg Patient Gender: M          HR:           74 bpm. Exam Location:  [facility] Procedure: 2D Echo, Cardiac Doppler and Color  Doppler Indications:    Chemotherapy evaluation v87.41 / v58.11  History:        Patient has prior history of Echocardiogram examinations, most                 recent 08/22/2020. CHF; Risk Factors:Hypertension. AIDS(acquired                 immune deficiency syndrome), Alcohol abuse, Polysubstance abuse                 -cocaine use in remission, Secondary cardiomyopathy ([place]) (From                 Hx).  Sonographer:    Alvino Chapel RCS Referring Phys: [phone] [place]  1. Left ventricular ejection fraction, by estimation, is approximately 50%. The left ventricle has low normal function. The left ventricle demonstrates global hypokinesis. There is severe asymmetric left  ventricular hypertrophy, septum greater than posterior wall. Consistent with hypertrophic versus infiltrative cardiomyopathy as noted previously. Left ventricular diastolic parameters are consistent with Grade II diastolic dysfunction (pseudonormalization).  2. Right ventricular systolic function is normal. The right ventricular size is normal. Tricuspid regurgitation signal is inadequate for assessing PA pressure.  3. Left atrial size was moderately dilated.  4. A small pericardial effusion is present. The pericardial effusion is posterior to the left ventricle.  5. The mitral valve is grossly normal. Mild mitral valve regurgitation.  6. The aortic valve is tricuspid. Aortic valve regurgitation is not visualized.  7. The inferior vena cava is normal in size with greater than 50% respiratory variability, suggesting right atrial pressure of 3 mmHg. FINDINGS  Left Ventricle: Left ventricular ejection fraction, by estimation, is 50%. The left ventricle has low normal function. The left ventricle demonstrates global hypokinesis. The left ventricular internal cavity size was small. There is severe asymmetric left ventricular hypertrophy of the septal segment. Left ventricular diastolic parameters are consistent with Grade II  diastolic dysfunction (pseudonormalization). Right Ventricle: The right ventricular size is normal. No increase in right ventricular wall thickness. Right ventricular systolic function is normal. Tricuspid regurgitation signal is inadequate for assessing PA pressure. Left Atrium: Left atrial size was moderately dilated. Right Atrium: Right atrial size was normal in size. Pericardium: A small pericardial effusion is present. The pericardial effusion is posterior to the left ventricle. Mitral Valve: The mitral valve is grossly normal. Mild mitral valve regurgitation. Tricuspid Valve: The tricuspid valve is grossly normal. Tricuspid valve regurgitation is trivial. Aortic Valve: The aortic valve is tricuspid. Aortic valve regurgitation is not visualized. Pulmonic Valve: The pulmonic valve was grossly normal. Pulmonic valve regurgitation is trivial. Aorta: The aortic root is normal in size and structure. Venous: The inferior vena cava is normal in size with greater than 50% respiratory variability, suggesting right atrial pressure of 3 mmHg. IAS/Shunts: No atrial level shunt detected by color flow Doppler.  LEFT VENTRICLE PLAX 2D LVIDd:         3.42 cm      Diastology LVIDs:         2.75 cm      LV e' medial:    4.90 cm/s LV PW:         2.62 cm      LV E/e' medial:  14.4 LV IVS:        3.32 cm      LV e' lateral:   7.83 cm/s LVOT diam:     2.10 cm      LV E/e' lateral: 9.0 LV SV:         88 LV SV Index:   45 LVOT Area:     3.46 cm  LV Volumes (MOD) LV vol d, MOD A2C: 113.0 ml LV vol d, MOD A4C: 114.0 ml LV vol s, MOD A2C: 68.3 ml LV vol s, MOD A4C: 64.2 ml LV SV MOD A2C:     44.7 ml LV SV MOD A4C:     114.0 ml LV SV MOD BP:      48.3 ml RIGHT VENTRICLE RV S prime:     11.60 cm/s TAPSE (M-mode): 2.2 cm LEFT ATRIUM             Index       RIGHT ATRIUM           Index LA diam:        5.10 cm 2.63 cm/m  RA Area:  14.90 cm LA Vol (A2C):   88.5 ml 45.57 ml/m RA Volume:   38.50 ml  19.82 ml/m LA Vol (A4C):   91.9 ml  47.32 ml/m LA Biplane Vol: 98.7 ml 50.82 ml/m  AORTIC VALVE LVOT Vmax:   135.00 cm/s LVOT Vmean:  93.000 cm/s LVOT VTI:    0.254 m  AORTA Ao Root diam: 3.80 cm MITRAL VALVE MV Area (PHT): 3.50 cm    SHUNTS MV Decel Time: 217 msec    Systemic VTI:  0.25 m MV E velocity: 70.80 cm/s  Systemic Diam: 2.10 cm MV A velocity: 50.60 cm/s MV E/A ratio:  1.40 Rozann Lesches MD Electronically signed by Rozann Lesches MD Signature Date/Time: 09/16/2020/11:44:34 AM    Final      ASSESSMENT:  1.B-cell high-grade lymphoma: -Presentation to the ER with left upper quadrant and mid abdominal pain for 2 weeks. -10 pound weight loss in the last 2 weeks due to decreased appetite. Denies any fevers or night sweats. -CT AP with contrast on 07/24/2020 shows 13.8 11.2 cm mass in the epigastric region, lobulated large mass in the mesenteric region, 9.3 x 5.1 cm mass in the inferior pelvis, 9.7 x 4.1 cm mass in the superior pelvis. Epigastric mass seems to abut posterior spleen. -LDH was elevated at 309, uric acid 10.2. -PET scan on 07/31/2020 showed extensive multifocal soft tissue masses within the abdomen and pelvis, most of them between SUV 15 and 20. FDG avid subpleural nodules overlying the right lung. Splenomegaly. Multiple scattered lung nodules, nonspecific. -Biopsy of the soft tissue mass in the upper abdomen on 08/15/2020 consistent with high-grade B-cell lymphoma. IHC positive for CD20, CD10, BCL6, negative for BCL-2, CD3, CD5, CD30, mum 1 and EBV. Ki-67 is 80-90%. 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 -He was evaluated by Dr. Lonia Blood at [facility] and was not felt to be a candidate for clinical trial. -Cycle 1 of R-EPOCH on 09/05/2020.  2. Social/family history: -He quit smoking last year. -Family history significant for mother and father who had multiple myeloma. Grandfather had colon cancer.  3. HIV/AIDS: -He is on Biktarvy 1  tablet by mouth daily.  4. CHF: -Echocardiogram on 09/16/2020 with EF 50%. -Echocardiogram on 08/22/2020 with EF 45-50%. -Echocardiogram on 09/18/2019 shows EF 40-45%. -He is on Coreg 6.25 mg twice daily, Imdur 30 mg daily. Lasix 40 mg is an expired medication list.   PLAN:  1.  Double hit lymphoma: -He has tolerated first cycle of R-EPOCH very well.  Also reviewed echocardiogram results which showed EF 50%. -He noticed improvement in abdominal pain. -Reviewed his labs which showed normal LFTs.  LDH is normal today.  CBC was adequate. -Proceed with cycle 2 of R-EPOCH today.  RTC 3 weeks. -Plan to repeat PET CT scan after cycle 3.  Plan to repeat echo after cycle 3.  2. Abdominal pain: -Pain has improved after cycle 1 of chemotherapy. -He has not required oxycodone except for 3 pills in the last 1 week.  3. Nausea: -Continue Zofran ODT 4 mg every 8 hours as needed.  4. Hyperuricemia: -Received respiratory case prior to cycle 1. -Uric acid today is 5.0.  Continue allopurinol daily.  5.  Hypokalemia: -Increase K-Dur to 20 mEq daily at home.   Orders placed this encounter:  No orders of the defined types were placed in this encounter.    Derek Jack, MD [facility] [phone]   I, Milinda Antis,  am acting as a scribe for Dr. Sanda Linger.  I, Derek Jack MD, have reviewed the above documentation for accuracy and completeness, and I agree with the above.

## 2020-09-24 ENCOUNTER — Inpatient Hospital Stay (HOSPITAL_COMMUNITY): Payer: 59 | Admitting: General Practice

## 2020-09-24 ENCOUNTER — Inpatient Hospital Stay (HOSPITAL_COMMUNITY): Payer: 59

## 2020-09-24 VITALS — BP 140/87 | HR 74 | Temp 97.5°F | Resp 18

## 2020-09-24 DIAGNOSIS — C8510 Unspecified B-cell lymphoma, unspecified site: Secondary | ICD-10-CM

## 2020-09-24 DIAGNOSIS — Z5111 Encounter for antineoplastic chemotherapy: Secondary | ICD-10-CM | POA: Diagnosis not present

## 2020-09-24 MED ORDER — SODIUM CHLORIDE 0.9 % IV SOLN: INTRAVENOUS | Status: DC

## 2020-09-24 MED ORDER — PREDNISONE 50 MG PO TABS
100.0000 mg | ORAL_TABLET | Freq: Once | ORAL | Status: AC
  Administered 2020-09-24: 100 mg via ORAL
  Filled 2020-09-24: qty 2

## 2020-09-24 MED ORDER — VINCRISTINE SULFATE CHEMO INJECTION 1 MG/ML
Freq: Once | INTRAVENOUS | Status: AC
  Filled 2020-09-24: qty 10

## 2020-09-24 MED ORDER — SODIUM CHLORIDE 0.9 % IV SOLN
Freq: Once | INTRAVENOUS | Status: AC
  Administered 2020-09-24: 8 mg via INTRAVENOUS
  Filled 2020-09-24: qty 4

## 2020-09-24 MED FILL — BIKTARVY 50-200-25 MG TABS: 50-200-25 | 30 days supply | Qty: 30 | Fill #3

## 2020-09-24 NOTE — Progress Notes (Unsigned)
Richard Cox presents today for D2C2 [facility] pump refill. Pt denies any new changes or symptoms overnight. VSS. Pump disconnected, all medication infused. Port flushed and premeds given per tx plan. Refill EPOCH connected and RUN noted on screen. Discharged in satisfactory condition with follow up instructions.

## 2020-09-24 NOTE — Progress Notes (Signed)
[facility] CSW Progress Notes  Unable to reach patient at either work or cell number, left VM on cell w my contact information and encouragement to call back.  Edwyna Shell, LCSW Clinical Social Worker Phone:  [phone] Cell:  [phone]

## 2020-09-24 NOTE — Patient Instructions (Signed)
[facility] Discharge Instructions for Patients Receiving Chemotherapy   Beginning January 23rd 2017 lab work for the [facility] will be done in the  Main lab at [facility] on 1st floor. If you have a lab appointment with the [facility] please come in thru the  Main Entrance and check in at the main information desk   Today you received the following chemotherapy agents EPOCH  To help prevent nausea and vomiting after your treatment, we encourage you to take your nausea medication If you develop nausea and vomiting, or diarrhea that is not controlled by your medication, call the clinic.  The clinic phone number is [phone]. Office hours are Monday-Friday 8:30am-5:00pm.  BELOW ARE SYMPTOMS THAT SHOULD BE REPORTED IMMEDIATELY:  *FEVER GREATER THAN 101.0 F  *CHILLS WITH OR WITHOUT FEVER  NAUSEA AND VOMITING THAT IS NOT CONTROLLED WITH YOUR NAUSEA MEDICATION  *UNUSUAL SHORTNESS OF BREATH  *UNUSUAL BRUISING OR BLEEDING  TENDERNESS IN MOUTH AND THROAT WITH OR WITHOUT PRESENCE OF ULCERS  *URINARY PROBLEMS  *BOWEL PROBLEMS  UNUSUAL RASH Items with * indicate a potential emergency and should be followed up as soon as possible. If you have an emergency after office hours please contact your primary care physician or go to the nearest emergency department.  Please call the clinic during office hours if you have any questions or concerns.   You may also contact the Patient Navigator at [phone] should you have any questions or need assistance in obtaining follow up care.      Resources For Cancer Patients and their Caregivers ? American Cancer Society: Can assist with transportation, wigs, general needs, runs Look Good Feel Better.        224-820-8361 ? Cancer Care: Provides financial assistance, online support groups, medication/co-pay assistance.  1-800-813-HOPE (862) 742-9071) ? [facility] Assists [place] cancer  patients and their families through emotional , educational and financial support.  (325)079-4942 ? [place] DSS Where to apply for food stamps, Medicaid and utility assistance. (541)865-4881 ? [facility]: Transportation to medical appointments. 856-299-3289 ? Social Security Administration: May apply for disability if have a Stage IV cancer. 708 236 2410 (423)182-2631 ? [employer], Disability and Transit Services: Assists with nutrition, care and transit needs. 864-569-5387

## 2020-09-25 ENCOUNTER — Other Ambulatory Visit: Payer: Self-pay

## 2020-09-25 ENCOUNTER — Inpatient Hospital Stay (HOSPITAL_COMMUNITY): Payer: 59

## 2020-09-25 VITALS — BP 116/67 | HR 78 | Temp 96.5°F | Resp 18

## 2020-09-25 DIAGNOSIS — Z5111 Encounter for antineoplastic chemotherapy: Secondary | ICD-10-CM | POA: Diagnosis not present

## 2020-09-25 DIAGNOSIS — C8510 Unspecified B-cell lymphoma, unspecified site: Secondary | ICD-10-CM

## 2020-09-25 MED ORDER — SODIUM CHLORIDE 0.9 % IV SOLN: Freq: Once | INTRAVENOUS | Status: AC

## 2020-09-25 MED ORDER — SODIUM CHLORIDE 0.9 % IV SOLN
Freq: Once | INTRAVENOUS | Status: AC
  Administered 2020-09-25: 8 mg via INTRAVENOUS
  Filled 2020-09-25: qty 4

## 2020-09-25 MED ORDER — VINCRISTINE SULFATE CHEMO INJECTION 1 MG/ML
Freq: Once | INTRAVENOUS | Status: AC
  Filled 2020-09-25: qty 10

## 2020-09-25 MED ORDER — PREDNISONE 50 MG PO TABS
100.0000 mg | ORAL_TABLET | Freq: Once | ORAL | Status: AC
  Administered 2020-09-25: 100 mg via ORAL
  Filled 2020-09-25: qty 2

## 2020-09-25 MED ORDER — SODIUM CHLORIDE 0.9% FLUSH
10.0000 mL | Freq: Once | INTRAVENOUS | Status: AC
  Administered 2020-09-25: 10 mL via INTRAVENOUS

## 2020-09-25 NOTE — Progress Notes (Signed)
Treatment given today per MD orders. Tolerated infusion without adverse affects. Vital signs stable. No complaints at this time. EPOCH pump infusing per protocol. RUN noted on the screen. Verified with patient. Discharged from clinic ambulatory in stable condition. Alert and oriented x 3. F/U with [facility] as scheduled.

## 2020-09-25 NOTE — Progress Notes (Signed)
Patient presents today for pump stop/start.  Vital signs within parameters.  No new complaints.

## 2020-09-26 ENCOUNTER — Inpatient Hospital Stay (HOSPITAL_COMMUNITY): Payer: 59

## 2020-09-26 VITALS — BP 115/62 | HR 76 | Temp 97.5°F | Resp 18

## 2020-09-26 DIAGNOSIS — Z5111 Encounter for antineoplastic chemotherapy: Secondary | ICD-10-CM | POA: Diagnosis not present

## 2020-09-26 DIAGNOSIS — C8510 Unspecified B-cell lymphoma, unspecified site: Secondary | ICD-10-CM

## 2020-09-26 MED ORDER — SODIUM CHLORIDE 0.9 % IV SOLN: INTRAVENOUS | Status: DC

## 2020-09-26 MED ORDER — VINCRISTINE SULFATE CHEMO INJECTION 1 MG/ML
Freq: Once | INTRAVENOUS | Status: AC
  Filled 2020-09-26: qty 10

## 2020-09-26 MED ORDER — SODIUM CHLORIDE 0.9 % IV SOLN
Freq: Once | INTRAVENOUS | Status: AC
  Administered 2020-09-26: 8 mg via INTRAVENOUS
  Filled 2020-09-26: qty 4

## 2020-09-26 MED ORDER — SODIUM CHLORIDE 0.9% FLUSH
10.0000 mL | Freq: Once | INTRAVENOUS | Status: AC
  Administered 2020-09-26: 10 mL via INTRAVENOUS

## 2020-09-26 MED ORDER — PREDNISONE 50 MG PO TABS
100.0000 mg | ORAL_TABLET | Freq: Once | ORAL | Status: AC
  Administered 2020-09-26: 100 mg via ORAL
  Filled 2020-09-26: qty 2

## 2020-09-26 NOTE — Patient Instructions (Signed)
[facility] Discharge Instructions for Patients Receiving Chemotherapy  Today you received the following chemotherapy agents   To help prevent nausea and vomiting after your treatment, we encourage you to take your nausea medication   If you develop nausea and vomiting that is not controlled by your nausea medication, call the clinic.   BELOW ARE SYMPTOMS THAT SHOULD BE REPORTED IMMEDIATELY:  *FEVER GREATER THAN 100.5 F  *CHILLS WITH OR WITHOUT FEVER  NAUSEA AND VOMITING THAT IS NOT CONTROLLED WITH YOUR NAUSEA MEDICATION  *UNUSUAL SHORTNESS OF BREATH  *UNUSUAL BRUISING OR BLEEDING  TENDERNESS IN MOUTH AND THROAT WITH OR WITHOUT PRESENCE OF ULCERS  *URINARY PROBLEMS  *BOWEL PROBLEMS  UNUSUAL RASH Items with * indicate a potential emergency and should be followed up as soon as possible.  Feel free to call the clinic should you have any questions or concerns. The clinic phone number is [phone].  Please show the CHEMO ALERT CARD at check-in to the Emergency Department and triage nurse.   

## 2020-09-26 NOTE — Progress Notes (Unsigned)
Patient presents today for [facility] pump stop/start.   Vital signs within parameters.  No new complaints.

## 2020-09-26 NOTE — Progress Notes (Unsigned)
Treatment given today per MD orders. Tolerated infusion without adverse affects. Vital signs stable. No complaints at this time. Pump infusing per protocol. RUN noted on the screen and verified with the patient. Discharged from clinic ambulatory in stable condition. Alert and oriented x 3. F/U with [facility] as scheduled.

## 2020-09-27 ENCOUNTER — Inpatient Hospital Stay (HOSPITAL_COMMUNITY): Payer: 59

## 2020-09-27 ENCOUNTER — Other Ambulatory Visit: Payer: Self-pay

## 2020-09-27 VITALS — BP 116/67 | HR 77 | Temp 97.6°F | Resp 18

## 2020-09-27 DIAGNOSIS — Z5111 Encounter for antineoplastic chemotherapy: Secondary | ICD-10-CM | POA: Diagnosis not present

## 2020-09-27 DIAGNOSIS — C8510 Unspecified B-cell lymphoma, unspecified site: Secondary | ICD-10-CM

## 2020-09-27 MED ORDER — HEPARIN SOD (PORK) LOCK FLUSH 100 UNIT/ML IV SOLN
500.0000 [IU] | Freq: Once | INTRAVENOUS | Status: AC
  Administered 2020-09-27: 500 [IU] via INTRAVENOUS

## 2020-09-27 MED ORDER — SODIUM CHLORIDE 0.9 % IV SOLN: INTRAVENOUS | Status: DC

## 2020-09-27 MED ORDER — PREDNISONE 50 MG PO TABS
100.0000 mg | ORAL_TABLET | Freq: Once | ORAL | Status: AC
  Administered 2020-09-27: 100 mg via ORAL
  Filled 2020-09-27: qty 2

## 2020-09-27 MED ORDER — SODIUM CHLORIDE 0.9% FLUSH
10.0000 mL | Freq: Once | INTRAVENOUS | Status: AC
  Administered 2020-09-27: 10 mL

## 2020-09-27 MED ORDER — SODIUM CHLORIDE 0.9 % IV SOLN
Freq: Once | INTRAVENOUS | Status: AC
  Administered 2020-09-27: 16 mg via INTRAVENOUS
  Filled 2020-09-27: qty 8

## 2020-09-27 MED ORDER — SODIUM CHLORIDE 0.9 % IV SOLN
750.0000 mg/m2 | Freq: Once | INTRAVENOUS | Status: AC
  Administered 2020-09-27: 1420 mg via INTRAVENOUS
  Filled 2020-09-27: qty 71

## 2020-09-27 NOTE — Progress Notes (Signed)
Patients port flushed without difficulty.  Good blood return noted with no bruising or swelling noted at site.  Chemotherapy pump removed.  Patient left accessed for today's treatment.

## 2020-09-27 NOTE — Progress Notes (Signed)
Tolerated Cytoxan infusion w/o adverse effects.  Alert, in no distress.  VSS.  Discharged ambulatory in stable condition.

## 2020-09-30 ENCOUNTER — Other Ambulatory Visit: Payer: Self-pay

## 2020-09-30 ENCOUNTER — Other Ambulatory Visit (HOSPITAL_COMMUNITY): Payer: Self-pay | Admitting: *Deleted

## 2020-09-30 ENCOUNTER — Encounter (HOSPITAL_COMMUNITY): Payer: Self-pay

## 2020-09-30 ENCOUNTER — Inpatient Hospital Stay (HOSPITAL_COMMUNITY): Payer: 59

## 2020-09-30 VITALS — BP 117/52 | HR 78 | Temp 96.9°F | Resp 18

## 2020-09-30 DIAGNOSIS — Z5111 Encounter for antineoplastic chemotherapy: Secondary | ICD-10-CM | POA: Diagnosis not present

## 2020-09-30 DIAGNOSIS — C8510 Unspecified B-cell lymphoma, unspecified site: Secondary | ICD-10-CM

## 2020-09-30 MED ORDER — PEGFILGRASTIM-CBQV 6 MG/0.6ML ~~LOC~~ SOSY
6.0000 mg | PREFILLED_SYRINGE | Freq: Once | SUBCUTANEOUS | Status: AC
  Administered 2020-09-30: 6 mg via SUBCUTANEOUS

## 2020-09-30 MED ORDER — ALLOPURINOL 300 MG PO TABS
300.0000 mg | ORAL_TABLET | Freq: Every day | ORAL | 3 refills | Status: DC
Start: 2020-09-30 — End: 2021-08-27

## 2020-09-30 NOTE — Progress Notes (Signed)
Patient presents today for Udenyca injection. Vital signs stable. Patient denies any pain today. Patient denies any changes since his last visit. Patient states he experienced nausea on Saturday and took his Compazine at home. Resolved. No nausea noted today. Patient inquiring about medication refill on Allopurinol. Per verbal order Dr. Delton Coombes no refill required. Uric acid 5.0 on 09/23/20.

## 2020-10-04 NOTE — Progress Notes (Signed)
[place], [place] [address]   CLINIC:  Medical Oncology/Hematology  PCP:  Lindell Spar, MD [address] / [place] [address] [phone]   REASON FOR VISIT:  Follow-up for high-grade B-cell lymphoma  PRIOR THERAPY: None  NGS Results: [employer] BCL6 and MYC rearrangement detected  CURRENT THERAPY: R-EPOCH every 3 weeks  BRIEF ONCOLOGIC HISTORY:  Oncology History  High grade B-cell lymphoma ([place])  08/21/2020 Initial Diagnosis   High grade B-cell lymphoma ([place])   09/02/2020 -  Chemotherapy   The patient had pegfilgrastim-cbqv (UDENYCA) injection 6 mg, 6 mg, Subcutaneous, Once, 3 of 4 cycles Administration: 6 mg (09/09/2020), 6 mg (09/30/2020) DOXOrubicin (ADRIAMYCIN) 20 mg, etoposide (VEPESID) 96 mg, vinCRIStine (ONCOVIN) 0.8 mg in sodium chloride 0.9 % 500 mL chemo infusion, , Intravenous, Once, 3 of 4 cycles Administration:  (09/02/2020),  (09/03/2020),  (09/23/2020),  (09/24/2020),  (09/04/2020),  (09/05/2020),  (09/25/2020),  (09/26/2020) ondansetron (ZOFRAN) 8 mg, dexamethasone (DECADRON) 10 mg in sodium chloride 0.9 % 50 mL IVPB, , Intravenous,  Once, 3 of 4 cycles Administration: 8 mg (09/02/2020), 8 mg (09/03/2020), 16 mg (09/06/2020), 8 mg (09/23/2020), 8 mg (09/24/2020), 16 mg (09/27/2020), 8 mg (09/04/2020), 8 mg (09/05/2020), 8 mg (09/25/2020), 8 mg (09/26/2020) cyclophosphamide (CYTOXAN) 1,420 mg in sodium chloride 0.9 % 250 mL chemo infusion, 750 mg/m2 = 1,420 mg, Intravenous,  Once, 3 of 4 cycles Administration: 1,420 mg (09/06/2020), 1,420 mg (09/27/2020) riTUXimab-pvvr (RUXIENCE) 700 mg in sodium chloride 0.9 % 250 mL (2.1875 mg/mL) infusion, 375 mg/m2 = 700 mg, Intravenous,  Once, 3 of 4 cycles Administration: 700 mg (09/02/2020), 700 mg (09/23/2020)  for chemotherapy treatment.    09/02/2020 Genetic Testing         CANCER STAGING: Cancer Staging No matching staging information was found for the  patient.  INTERVAL HISTORY:  [name], a 39 y.o. male, returns for routine follow-up and consideration for next cycle of chemotherapy.  He is on R [facility],  C1D1 on 09/02/2020 C2D1 on 09/23/2020  He is here for pre chemo appointment before C3D1. He is doing really well. No neuropathy or cardiac concerns. Some abdominal pain, ongoing for which he takes 1-3 tabs of oxycodone a day. No other B symptoms. Appetite and fatigue are better.  REVIEW OF SYSTEMS:  Review of Systems  Constitutional: Positive for appetite change (90%). Fatigue: 90%  HENT:   Negative for trouble swallowing (pills & liquids).   Gastrointestinal: Positive for diarrhea (mild after chemo, 2 a day). Nausea: mild after chemo.  Neurological: Negative for dizziness.  All other systems reviewed and are negative.   PAST MEDICAL/SURGICAL HISTORY:  Past Medical History:  Diagnosis Date  . CHF (congestive heart failure) ([place])   . Essential hypertension   . HIV infection ([place])   . Noncompliance with medication regimen   . 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 IR IMAGING GUIDED PORT INSERTION  08/23/2020   Right  . NO PAST SURGERIES      SOCIAL HISTORY:  Social History   Socioeconomic History  . Marital status: Single    Spouse name: Not on file  . Number of children: 0  . Years of education: 26  . Highest education level: Not on file  Occupational History  . Not on file  Tobacco Use  . Smoking status: Light Tobacco Smoker    Packs/day: 0.25    Types: Cigarettes  . Smokeless  tobacco: Never Used  Vaping Use  . Vaping Use: Never used  Substance and Sexual Activity  . Alcohol use: Not Currently  . Drug use: Not Currently    Types: Marijuana    Comment: x 1 week  . Sexual activity: Not Currently    Comment: pt given condoms  Other Topics Concern  . Not on file  Social History Narrative  . Not on file   Social Determinants of Health   Financial  Resource Strain: Low Risk   . Difficulty of Paying Living Expenses: Not very hard  Food Insecurity: No Food Insecurity  . Worried About Charity fundraiser in the Last Year: Never true  . Ran Out of Food in the Last Year: Never true  Transportation Needs: No Transportation Needs  . Lack of Transportation (Medical): No  . Lack of Transportation (Non-Medical): No  Physical Activity: Insufficiently Active  . Days of Exercise per Week: 5 days  . Minutes of Exercise per Session: 20 min  Stress: No Stress Concern Present  . Feeling of Stress : Only a little  Social Connections: Socially Isolated  . Frequency of Communication with Friends and Family: More than three times a week  . Frequency of Social Gatherings with Friends and Family: Never  . Attends Religious Services: Never  . Active Member of Clubs or Organizations: No  . Attends Archivist Meetings: Not on file  . Marital Status: Never married  Intimate Partner Violence: Not At Risk  . Fear of Current or Ex-Partner: No  . Emotionally Abused: No  . Physically Abused: No  . Sexually Abused: No    FAMILY HISTORY:  Family History  Problem Relation Age of Onset  . Diabetes Mellitus II Mother   . Cancer Mother        multiple myeloma  . Cancer Father        multiple myeloma  . Congestive Heart Failure Brother   . Heart Problems Brother   . Lupus Brother     CURRENT MEDICATIONS:  Current Outpatient Medications  Medication Sig Dispense Refill  . acetaminophen (TYLENOL) 500 MG tablet Take 1,000 mg by mouth every 6 (six) hours as needed for moderate pain or headache.    . allopurinol (ZYLOPRIM) 300 MG tablet Take 1 tablet (300 mg total) by mouth daily. 30 tablet 3  . bictegravir-emtricitabine-tenofovir AF (BIKTARVY) 50-200-25 MG TABS tablet Take 1 tablet by mouth daily. 30 tablet 5  . carvedilol (COREG) 6.25 MG tablet Take 1 tablet (6.25 mg total) by mouth 2 (two) times daily with a meal. For BP and Heart (Patient taking  differently: Take 6.25 mg by mouth 2 (two) times daily with a meal. ) 60 tablet 11  . dicyclomine (BENTYL) 10 MG capsule     . docusate sodium (COLACE) 100 MG capsule Take 100 mg by mouth daily as needed for mild constipation.    . furosemide (LASIX) 40 MG tablet Take 1 tablet (40 mg total) by mouth daily. 30 tablet 3  . hydrALAZINE (APRESOLINE) 50 MG tablet Take 50 mg by mouth 3 (three) times daily.    . isosorbide mononitrate (IMDUR) 30 MG 24 hr tablet Take 30 mg by mouth daily.    Marland Kitchen lidocaine-prilocaine (EMLA) cream Apply 1 application topically as needed. 30 g 0  . losartan (COZAAR) 50 MG tablet Take 1 tablet (50 mg total) by mouth daily. 90 tablet 3  . ondansetron (ZOFRAN-ODT) 4 MG disintegrating tablet     . oxyCODONE (OXY  IR/ROXICODONE) 5 MG immediate release tablet Take 2 tablets (10 mg total) by mouth every 12 (twelve) hours as needed for severe pain. 120 tablet 0  . potassium chloride (KLOR-CON) 20 MEQ tablet Take 0.5 tablets (10 mEq total) by mouth daily. 30 tablet 1  . prochlorperazine (COMPAZINE) 10 MG tablet Take 1 tablet (10 mg total) by mouth every 6 (six) hours as needed for nausea or vomiting. 30 tablet 2  . sulfamethoxazole-trimethoprim (BACTRIM DS) 800-160 MG tablet Take 1 tablet by mouth daily. 30 tablet 5   No current facility-administered medications for this visit.   Facility-Administered Medications Ordered in Other Visits  Medication Dose Route Frequency Provider Last Rate Last Admin  . 0.9 %  sodium chloride infusion   Intravenous Continuous Derek Jack, MD      . 0.9 %  sodium chloride infusion   Intravenous Continuous Derek Jack, MD   Stopped at 09/26/20 1124  . 0.9 %  sodium chloride infusion   Intravenous Continuous Margart Zemanek, MD      . acetaminophen (TYLENOL) tablet 650 mg  650 mg Oral Once Skyleigh Windle, Arletha Pili, MD      . diphenhydrAMINE (BENADRYL) injection 50 mg  50 mg Intravenous Once Patrice Matthew, MD      . DOXOrubicin (ADRIAMYCIN) 20  mg, etoposide (VEPESID) 96 mg, vinCRIStine (ONCOVIN) 0.8 mg in sodium chloride 0.9 % 500 mL chemo infusion   Intravenous Once Anysha Frappier, Arletha Pili, MD      . famotidine (PEPCID) IVPB 20 mg premix  20 mg Intravenous Once Dejanira Pamintuan, MD      . ondansetron (ZOFRAN) 8 mg, dexamethasone (DECADRON) 10 mg in sodium chloride 0.9 % 50 mL IVPB   Intravenous Once Azilee Pirro, MD      . riTUXimab-pvvr (RUXIENCE) 700 mg in sodium chloride 0.9 % 250 mL (2.1875 mg/mL) infusion  375 mg/m2 (Treatment Plan Recorded) Intravenous Once Benay Pike, MD        ALLERGIES:  Allergies  Allergen Reactions  . Neosporin [Neomycin-Bacitracin Zn-Polymyx] Rash    PHYSICAL EXAM:  Performance status (ECOG): 0 - Asymptomatic  There were no vitals filed for this visit. Wt Readings from Last 3 Encounters:  09/09/20 157 lb 3 oz (71.3 kg)  09/02/20 157 lb 14.4 oz (71.6 kg)  08/26/20 155 lb (70.3 kg)   Physical Exam Vitals reviewed.  Constitutional:      Appearance: Normal appearance.  Cardiovascular:     Rate and Rhythm: Normal rate and regular rhythm.     Pulses: Normal pulses.     Heart sounds: Normal heart sounds.  Pulmonary:     Effort: Pulmonary effort is normal.     Breath sounds: Normal breath sounds.  Abdominal:     Palpations: Abdomen is soft. There is no hepatomegaly, splenomegaly or mass.     Tenderness: There is no abdominal tenderness.     Hernia: No hernia is present.  Lymphadenopathy:     Upper Body:     Right upper body: No supraclavicular, axillary or pectoral adenopathy.     Left upper body: No supraclavicular, axillary or pectoral adenopathy.     Lower Body: No right inguinal adenopathy. No left inguinal adenopathy.  Neurological:     General: No focal deficit present.     Mental Status: He is alert and oriented to person, place, and time.  Psychiatric:        Mood and Affect: Mood normal.        Behavior: Behavior normal.  LABORATORY DATA:  I have reviewed the labs as  listed.  CBC Latest Ref Rng & Units 10/14/2020 09/23/2020 09/09/2020  WBC 4.0 - 10.5 K/uL 6.9 8.0 4.3  Hemoglobin 13.0 - 17.0 g/dL 10.3(L) 10.8(L) 11.6(L)  Hematocrit 39 - 52 % 31.9(L) 33.9(L) 34.7(L)  Platelets 150 - 400 K/uL 120(L) 161 158   CMP Latest Ref Rng & Units 10/14/2020 09/23/2020 09/09/2020  Glucose 70 - 99 mg/dL 147(H) 171(H) 86  BUN 6 - 20 mg/dL 18 13 24(H)  Creatinine 0.61 - 1.24 mg/dL 1.20 1.23 1.00  Sodium 135 - 145 mmol/L 136 138 135  Potassium 3.5 - 5.1 mmol/L 3.6 3.4(L) 3.4(L)  Chloride 98 - 111 mmol/L 105 105 101  CO2 22 - 32 mmol/L '22 22 26  ' Calcium 8.9 - 10.3 mg/dL 8.8(L) 8.8(L) 8.4(L)  Total Protein 6.5 - 8.1 g/dL 6.8 7.1 6.1(L)  Total Bilirubin 0.3 - 1.2 mg/dL 0.3 0.2(L) 0.6  Alkaline Phos 38 - 126 U/L 75 92 56  AST 15 - 41 U/L '18 20 25  ' ALT 0 - 44 U/L '15 17 27   ' Lab Results  Component Value Date   LDH 155 10/14/2020   LDH 168 09/23/2020   LDH 209 (H) 09/09/2020    DIAGNOSTIC IMAGING:  I have independently reviewed the scans and discussed with the patient. ECHOCARDIOGRAM COMPLETE  Result Date: 09/16/2020    ECHOCARDIOGRAM REPORT   Patient Name:   DRE GAMINO Date of Exam: 09/16/2020 Medical Rec #:  [id number]  Height:       73.0 in Accession #:    [id number] Weight:       157.2 lb Date of Birth:  12/23/1980   BSA:          1.942 m Patient Age:    69 years   BP:           121/72 mmHg Patient Gender: M          HR:           74 bpm. Exam Location:  [facility] Procedure: 2D Echo, Cardiac Doppler and Color Doppler Indications:    Chemotherapy evaluation v87.41 / v58.11  History:        Patient has prior history of Echocardiogram examinations, most                 recent 08/22/2020. CHF; Risk Factors:Hypertension. AIDS(acquired                 immune deficiency syndrome), Alcohol abuse, Polysubstance abuse                 -cocaine use in remission, Secondary cardiomyopathy ([place]) (From                 Hx).  Sonographer:    Alvino Chapel RCS Referring Phys: [phone] [place]  1. Left ventricular ejection fraction, by estimation, is approximately 50%. The left ventricle has low normal function. The left ventricle demonstrates global hypokinesis. There is severe asymmetric left ventricular hypertrophy, septum greater than posterior wall. Consistent with hypertrophic versus infiltrative cardiomyopathy as noted previously. Left ventricular diastolic parameters are consistent with Grade II diastolic dysfunction (pseudonormalization).  2. Right ventricular systolic function is normal. The right ventricular size is normal. Tricuspid regurgitation signal is inadequate for assessing PA pressure.  3. Left atrial size was moderately dilated.  4. A small pericardial effusion is present. The pericardial effusion is posterior to the left ventricle.  5. The mitral valve is grossly  normal. Mild mitral valve regurgitation.  6. The aortic valve is tricuspid. Aortic valve regurgitation is not visualized.  7. The inferior vena cava is normal in size with greater than 50% respiratory variability, suggesting right atrial pressure of 3 mmHg. FINDINGS  Left Ventricle: Left ventricular ejection fraction, by estimation, is 50%. The left ventricle has low normal function. The left ventricle demonstrates global hypokinesis. The left ventricular internal cavity size was small. There is severe asymmetric left ventricular hypertrophy of the septal segment. Left ventricular diastolic parameters are consistent with Grade II diastolic dysfunction (pseudonormalization). Right Ventricle: The right ventricular size is normal. No increase in right ventricular wall thickness. Right ventricular systolic function is normal. Tricuspid regurgitation signal is inadequate for assessing PA pressure. Left Atrium: Left atrial size was moderately dilated. Right Atrium: Right atrial size was normal in size. Pericardium: A small pericardial effusion is present. The pericardial effusion is posterior to the left  ventricle. Mitral Valve: The mitral valve is grossly normal. Mild mitral valve regurgitation. Tricuspid Valve: The tricuspid valve is grossly normal. Tricuspid valve regurgitation is trivial. Aortic Valve: The aortic valve is tricuspid. Aortic valve regurgitation is not visualized. Pulmonic Valve: The pulmonic valve was grossly normal. Pulmonic valve regurgitation is trivial. Aorta: The aortic root is normal in size and structure. Venous: The inferior vena cava is normal in size with greater than 50% respiratory variability, suggesting right atrial pressure of 3 mmHg. IAS/Shunts: No atrial level shunt detected by color flow Doppler.  LEFT VENTRICLE PLAX 2D LVIDd:         3.42 cm      Diastology LVIDs:         2.75 cm      LV e' medial:    4.90 cm/s LV PW:         2.62 cm      LV E/e' medial:  14.4 LV IVS:        3.32 cm      LV e' lateral:   7.83 cm/s LVOT diam:     2.10 cm      LV E/e' lateral: 9.0 LV SV:         88 LV SV Index:   45 LVOT Area:     3.46 cm  LV Volumes (MOD) LV vol d, MOD A2C: 113.0 ml LV vol d, MOD A4C: 114.0 ml LV vol s, MOD A2C: 68.3 ml LV vol s, MOD A4C: 64.2 ml LV SV MOD A2C:     44.7 ml LV SV MOD A4C:     114.0 ml LV SV MOD BP:      48.3 ml RIGHT VENTRICLE RV S prime:     11.60 cm/s TAPSE (M-mode): 2.2 cm LEFT ATRIUM             Index       RIGHT ATRIUM           Index LA diam:        5.10 cm 2.63 cm/m  RA Area:     14.90 cm LA Vol (A2C):   88.5 ml 45.57 ml/m RA Volume:   38.50 ml  19.82 ml/m LA Vol (A4C):   91.9 ml 47.32 ml/m LA Biplane Vol: 98.7 ml 50.82 ml/m  AORTIC VALVE LVOT Vmax:   135.00 cm/s LVOT Vmean:  93.000 cm/s LVOT VTI:    0.254 m  AORTA Ao Root diam: 3.80 cm MITRAL VALVE MV Area (PHT): 3.50 cm    SHUNTS MV Decel Time: 217 msec  Systemic VTI:  0.25 m MV E velocity: 70.80 cm/s  Systemic Diam: 2.10 cm MV A velocity: 50.60 cm/s MV E/A ratio:  1.40 Rozann Lesches MD Electronically signed by Rozann Lesches MD Signature Date/Time: 09/16/2020/11:44:34 AM    Final       ASSESSMENT:  1.B-cell high-grade lymphoma:  -Presentation to the ER with left upper quadrant and mid abdominal pain for 2 weeks. -10 pound weight loss prior to presentation, no other B symptoms. -CT AP with contrast on 07/24/2020 shows 13.8 11.2 cm mass in the epigastric region, lobulated large mass in the mesenteric region, 9.3 x 5.1 cm mass in the inferior pelvis, 9.7 x 4.1 cm mass in the superior pelvis. Epigastric mass seems to abut posterior spleen. -LDH was elevated at 309, uric acid 10.2. -PET scan on 07/31/2020 showed extensive multifocal soft tissue masses within the abdomen and pelvis, most of them between SUV 15 and 20. FDG avid subpleural nodules overlying the right lung. Splenomegaly. Multiple scattered lung nodules, nonspecific. -Biopsy of the soft tissue mass in the upper abdomen on 08/15/2020 consistent with high-grade B-cell lymphoma. IHC positive for CD20, CD10, BCL6, negative for BCL-2, CD3, CD5, CD30, mum 1 and EBV. Ki-67 is 80-90%. 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 -He was evaluated by Dr. Lonia Blood at [facility] and was not felt to be a candidate for clinical trial. -Cycle 1 of R-EPOCH on 09/05/2020. - Cycle 2 of R EPOCH on 09/23/2020   2. Social/family history: -He quit smoking last year. -Family history significant for mother and father who had multiple myeloma. Grandfather had colon cancer.  3. 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  4. CHF: -Echocardiogram on 09/16/2020 with EF 50%. -Echocardiogram on 08/22/2020 with EF 45-50%. -Echocardiogram on 09/17/2020 shows EF 40-45%. -He is on Coreg 6.25 mg twice daily, Imdur 30 mg daily. Lasix 40 mg is an expired medication list.  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   - Abdominal pain stable since last visit, no new symptoms. -Reviewed his labs today, no concerns,  mild thrombocytopenia, ok to monitor -Proceed with cycle 3 of R-EPOCH today.  RTC 3 weeks. -Plan to repeat PET CT scan after cycle 3.  Plan to repeat echo after cycle 3. Ordered.  2. Abdominal pain: - ongoing, mild mostly, occasionally needs 3 tabs a day.  3. Nausea: -Continue Zofran ODT 4 mg every 8 hours as needed.  4. Hyperuricemia: -Received rasburicase prior to cycle 1. -Uric acid today is 6.7.  Continue allopurinol daily.   Orders placed this encounter:  Orders Placed This Encounter  Procedures  . NM PET Image Restag (PS) Skull Base To Thigh  . ECHOCARDIOGRAM COMPLETE    Time NA  Benay Pike MD

## 2020-10-14 ENCOUNTER — Other Ambulatory Visit (HOSPITAL_COMMUNITY): Payer: Self-pay | Admitting: *Deleted

## 2020-10-14 ENCOUNTER — Inpatient Hospital Stay (HOSPITAL_BASED_OUTPATIENT_CLINIC_OR_DEPARTMENT_OTHER): Payer: 59 | Admitting: Hematology and Oncology

## 2020-10-14 ENCOUNTER — Inpatient Hospital Stay (HOSPITAL_COMMUNITY): Payer: 59

## 2020-10-14 ENCOUNTER — Inpatient Hospital Stay (HOSPITAL_COMMUNITY): Payer: 59 | Attending: Hematology

## 2020-10-14 ENCOUNTER — Encounter (HOSPITAL_COMMUNITY): Payer: Self-pay | Admitting: Hematology and Oncology

## 2020-10-14 ENCOUNTER — Other Ambulatory Visit: Payer: Self-pay

## 2020-10-14 VITALS — BP 114/65 | HR 74 | Temp 97.0°F | Resp 18

## 2020-10-14 DIAGNOSIS — C8513 Unspecified B-cell lymphoma, intra-abdominal lymph nodes: Secondary | ICD-10-CM | POA: Diagnosis not present

## 2020-10-14 DIAGNOSIS — E790 Hyperuricemia without signs of inflammatory arthritis and tophaceous disease: Secondary | ICD-10-CM | POA: Insufficient documentation

## 2020-10-14 DIAGNOSIS — E876 Hypokalemia: Secondary | ICD-10-CM | POA: Insufficient documentation

## 2020-10-14 DIAGNOSIS — R109 Unspecified abdominal pain: Secondary | ICD-10-CM | POA: Diagnosis not present

## 2020-10-14 DIAGNOSIS — Z87891 Personal history of nicotine dependence: Secondary | ICD-10-CM | POA: Insufficient documentation

## 2020-10-14 DIAGNOSIS — Z5111 Encounter for antineoplastic chemotherapy: Secondary | ICD-10-CM | POA: Insufficient documentation

## 2020-10-14 DIAGNOSIS — C8510 Unspecified B-cell lymphoma, unspecified site: Secondary | ICD-10-CM

## 2020-10-14 DIAGNOSIS — R5383 Other fatigue: Secondary | ICD-10-CM | POA: Insufficient documentation

## 2020-10-14 DIAGNOSIS — I110 Hypertensive heart disease with heart failure: Secondary | ICD-10-CM | POA: Diagnosis not present

## 2020-10-14 DIAGNOSIS — R197 Diarrhea, unspecified: Secondary | ICD-10-CM | POA: Diagnosis not present

## 2020-10-14 DIAGNOSIS — R630 Anorexia: Secondary | ICD-10-CM | POA: Insufficient documentation

## 2020-10-14 DIAGNOSIS — I509 Heart failure, unspecified: Secondary | ICD-10-CM | POA: Insufficient documentation

## 2020-10-14 DIAGNOSIS — Z5112 Encounter for antineoplastic immunotherapy: Secondary | ICD-10-CM | POA: Diagnosis present

## 2020-10-14 DIAGNOSIS — Z79899 Other long term (current) drug therapy: Secondary | ICD-10-CM | POA: Diagnosis not present

## 2020-10-14 LAB — COMPREHENSIVE METABOLIC PANEL
ALT: 15 U/L (ref 0–44)
AST: 18 U/L (ref 15–41)
Albumin: 3.9 g/dL (ref 3.5–5.0)
Alkaline Phosphatase: 75 U/L (ref 38–126)
Anion gap: 9 (ref 5–15)
BUN: 18 mg/dL (ref 6–20)
CO2: 22 mmol/L (ref 22–32)
Calcium: 8.8 mg/dL — ABNORMAL LOW (ref 8.9–10.3)
Chloride: 105 mmol/L (ref 98–111)
Creatinine, Ser: 1.2 mg/dL (ref 0.61–1.24)
GFR, Estimated: >60 mL/min (ref >60)
Glucose, Bld: 147 mg/dL — ABNORMAL HIGH (ref 70–99)
Potassium: 3.6 mmol/L (ref 3.5–5.1)
Sodium: 136 mmol/L (ref 135–145)
Total Bilirubin: 0.3 mg/dL (ref 0.3–1.2)
Total Protein: 6.8 g/dL (ref 6.5–8.1)

## 2020-10-14 LAB — CBC WITH DIFFERENTIAL/PLATELET
Abs Immature Granulocytes: 0.04 10*3/uL (ref 0.00–0.07)
Basophils Absolute: 0 10*3/uL (ref 0.0–0.1)
Basophils Relative: 0 %
Eosinophils Absolute: 0 10*3/uL (ref 0.0–0.5)
Eosinophils Relative: 0 %
HCT: 31.9 % — ABNORMAL LOW (ref 39.0–52.0)
Hemoglobin: 10.3 g/dL — ABNORMAL LOW (ref 13.0–17.0)
Immature Granulocytes: 1 %
Lymphocytes Relative: 18 %
Lymphs Abs: 1.2 10*3/uL (ref 0.7–4.0)
MCH: 30 pg (ref 26.0–34.0)
MCHC: 32.3 g/dL (ref 30.0–36.0)
MCV: 93 fL (ref 80.0–100.0)
Monocytes Absolute: 0.6 10*3/uL (ref 0.1–1.0)
Monocytes Relative: 9 %
Neutro Abs: 5 10*3/uL (ref 1.7–7.7)
Neutrophils Relative %: 72 %
Platelets: 120 10*3/uL — ABNORMAL LOW (ref 150–400)
RBC: 3.43 MIL/uL — ABNORMAL LOW (ref 4.22–5.81)
RDW: 21.8 % — ABNORMAL HIGH (ref 11.5–15.5)
WBC: 6.9 10*3/uL (ref 4.0–10.5)
nRBC: 0.6 % — ABNORMAL HIGH (ref 0.0–0.2)

## 2020-10-14 LAB — URIC ACID: Uric Acid, Serum: 6.7 mg/dL (ref 3.7–8.6)

## 2020-10-14 LAB — LACTATE DEHYDROGENASE: LDH: 155 U/L (ref 98–192)

## 2020-10-14 LAB — PHOSPHORUS: Phosphorus: 4.5 mg/dL (ref 2.5–4.6)

## 2020-10-14 MED ORDER — SODIUM CHLORIDE 0.9 % IV SOLN: INTRAVENOUS | Status: DC

## 2020-10-14 MED ORDER — OXYCODONE HCL 5 MG PO TABS: 10.0000 mg | ORAL_TABLET | Freq: Two times a day (BID) | ORAL | 0 refills | Status: DC | PRN

## 2020-10-14 MED ORDER — ACETAMINOPHEN 325 MG PO TABS
ORAL_TABLET | ORAL | Status: AC
  Filled 2020-10-14: qty 2

## 2020-10-14 MED ORDER — SODIUM CHLORIDE 0.9 % IV SOLN
Freq: Once | INTRAVENOUS | Status: AC
  Administered 2020-10-14: 8 mg via INTRAVENOUS
  Filled 2020-10-14: qty 4

## 2020-10-14 MED ORDER — DIPHENHYDRAMINE HCL 50 MG/ML IJ SOLN
INTRAMUSCULAR | Status: AC
  Filled 2020-10-14: qty 1

## 2020-10-14 MED ORDER — ACETAMINOPHEN 325 MG PO TABS
650.0000 mg | ORAL_TABLET | Freq: Once | ORAL | Status: AC
  Administered 2020-10-14: 650 mg via ORAL

## 2020-10-14 MED ORDER — FAMOTIDINE IN NACL 20-0.9 MG/50ML-% IV SOLN
20.0000 mg | Freq: Once | INTRAVENOUS | Status: AC
  Administered 2020-10-14: 20 mg via INTRAVENOUS

## 2020-10-14 MED ORDER — SODIUM CHLORIDE 0.9 % IV SOLN
375.0000 mg/m2 | Freq: Once | INTRAVENOUS | Status: AC
  Administered 2020-10-14: 700 mg via INTRAVENOUS
  Filled 2020-10-14: qty 50

## 2020-10-14 MED ORDER — FAMOTIDINE IN NACL 20-0.9 MG/50ML-% IV SOLN
INTRAVENOUS | Status: AC
  Filled 2020-10-14: qty 50

## 2020-10-14 MED ORDER — VINCRISTINE SULFATE CHEMO INJECTION 1 MG/ML
Freq: Once | INTRAVENOUS | Status: AC
  Filled 2020-10-14: qty 10

## 2020-10-14 MED ORDER — DIPHENHYDRAMINE HCL 50 MG/ML IJ SOLN
50.0000 mg | Freq: Once | INTRAMUSCULAR | Status: AC
  Administered 2020-10-14: 50 mg via INTRAVENOUS

## 2020-10-14 NOTE — Progress Notes (Addendum)
Patient was assessed by Dr. Chryl Heck and labs have been reviewed.  Patient is okay to proceed with treatment today. Primary RN and pharmacy aware.

## 2020-10-14 NOTE — Patient Instructions (Signed)
Richard Cox at [facility] Discharge Instructions  You were seen today by Dr. Chryl Heck.  She is covering for Dr. Delton Coombes.  You got your treatment today and we will do scans before your next treatment.     Thank you for choosing [place] at [facility] to provide your oncology and hematology care.  To afford each patient quality time with our provider, please arrive at least 15 minutes before your scheduled appointment time.   If you have a lab appointment with the [facility] please come in thru the Main Entrance and check in at the main information desk.  You need to re-schedule your appointment should you arrive 10 or more minutes late.  We strive to give you quality time with our providers, and arriving late affects you and other patients whose appointments are after yours.  Also, if you no show three or more times for appointments you may be dismissed from the clinic at the providers discretion.     Again, thank you for choosing [facility].  Our hope is that these requests will decrease the amount of time that you wait before being seen by our physicians.       _____________________________________________________________  Should you have questions after your visit to [facility], please contact our office at [phone] and follow the prompts.  Our office hours are 8:00 a.m. and 4:30 p.m. Monday - Friday.  Please note that voicemails left after 4:00 p.m. may not be returned until the following business day.  We are closed weekends and major holidays.  You do have access to a nurse 24-7, just call the main number to the clinic [phone] and do not press any options, hold on the line and a nurse will answer the phone.    For prescription refill requests, have your pharmacy contact our office and allow 72 hours.    Due to Covid, you will need to wear a mask upon entering the hospital. If you do not have a mask, a  mask will be given to you at the Main Entrance upon arrival. For doctor visits, patients may have 1 support person age 3 or older with them. For treatment visits, patients can not have anyone with them due to social distancing guidelines and our immunocompromised population.

## 2020-10-14 NOTE — Progress Notes (Signed)
Richard Cox presents today for D1C3 R-EPOCH. Pt denies any new changes or symptoms since last treatment. Lab results and vitals have been reviewed and are stable and within parameters for treatment. Patient has been assessed by Dr. Chryl Heck who has approved proceeding with treatment today as planned.   Infusions tolerated without incident or complaint. VSS upon completion of treatment. Port flushed and EPOCH connected to infuse for 22 hours, RUN noted on screen, see MAR and IV flowsheet for details. Discharged in satisfactory condition with follow up instructions.

## 2020-10-14 NOTE — Patient Instructions (Signed)
[facility] at [facility] Discharge Instructions  Labs drawn from portacath today   Thank you for choosing [facility] at [facility] to provide your oncology and hematology care.  To afford each patient quality time with our provider, please arrive at least 15 minutes before your scheduled appointment time.   If you have a lab appointment with the Cancer Center please come in thru the Main Entrance and check in at the main information desk.  You need to re-schedule your appointment should you arrive 10 or more minutes late.  We strive to give you quality time with our providers, and arriving late affects you and other patients whose appointments are after yours.  Also, if you no show three or more times for appointments you may be dismissed from the clinic at the providers discretion.     Again, thank you for choosing [facility].  Our hope is that these requests will decrease the amount of time that you wait before being seen by our physicians.       _____________________________________________________________  Should you have questions after your visit to [facility], please contact our office at [phone] and follow the prompts.  Our office hours are 8:00 a.m. and 4:30 p.m. Monday - Friday.  Please note that voicemails left after 4:00 p.m. may not be returned until the following business day.  We are closed weekends and major holidays.  You do have access to a nurse 24-7, just call the main number to the clinic [phone] and do not press any options, hold on the line and a nurse will answer the phone.    For prescription refill requests, have your pharmacy contact our office and allow 72 hours.    Due to Covid, you will need to wear a mask upon entering the hospital. If you do not have a mask, a mask will be given to you at the Main Entrance upon arrival. For doctor visits, patients may have 1 support person age 18  or older with them. For treatment visits, patients can not have anyone with them due to social distancing guidelines and our immunocompromised population.     

## 2020-10-14 NOTE — Patient Instructions (Signed)
[facility] Discharge Instructions for Patients Receiving Chemotherapy   Beginning January 23rd 2017 lab work for the Cancer Center will be done in the  Main lab at [place] on 1st floor. If you have a lab appointment with the Cancer Center please come in thru the  Main Entrance and check in at the main information desk   Today you received the following chemotherapy agents Keytruda  To help prevent nausea and vomiting after your treatment, we encourage you to take your nausea medication   If you develop nausea and vomiting, or diarrhea that is not controlled by your medication, call the clinic.  The clinic phone number is [phone]. Office hours are Monday-Friday 8:30am-5:00pm.  BELOW ARE SYMPTOMS THAT SHOULD BE REPORTED IMMEDIATELY:  *FEVER GREATER THAN 101.0 F  *CHILLS WITH OR WITHOUT FEVER  NAUSEA AND VOMITING THAT IS NOT CONTROLLED WITH YOUR NAUSEA MEDICATION  *UNUSUAL SHORTNESS OF BREATH  *UNUSUAL BRUISING OR BLEEDING  TENDERNESS IN MOUTH AND THROAT WITH OR WITHOUT PRESENCE OF ULCERS  *URINARY PROBLEMS  *BOWEL PROBLEMS  UNUSUAL RASH Items with * indicate a potential emergency and should be followed up as soon as possible. If you have an emergency after office hours please contact your primary care physician or go to the nearest emergency department.  Please call the clinic during office hours if you have any questions or concerns.   You may also contact the Patient Navigator at [phone] should you have any questions or need assistance in obtaining follow up care.      Resources For Cancer Patients and their Caregivers ? American Cancer Society: Can assist with transportation, wigs, general needs, runs Look Good Feel Better.        1-888-227-6333 ? Cancer Care: Provides financial assistance, online support groups, medication/co-pay assistance.  1-800-813-HOPE (4673) ? [facility] Assists [place] cancer  patients and their families through emotional , educational and financial support.  336-427-4357 ? [place] DSS Where to apply for food stamps, Medicaid and utility assistance. 336-342-1394 ? [facility]: Transportation to medical appointments. 336-347-2287 ? Social Security Administration: May apply for disability if have a Stage IV cancer. 336-342-7796 1-800-772-1213 ? [place] [facility], Disability and Transit Services: Assists with nutrition, care and transit needs. 336-349-2343          

## 2020-10-15 ENCOUNTER — Inpatient Hospital Stay (HOSPITAL_COMMUNITY): Payer: 59

## 2020-10-15 VITALS — BP 136/74 | HR 79 | Temp 97.4°F | Resp 17

## 2020-10-15 DIAGNOSIS — Z5111 Encounter for antineoplastic chemotherapy: Secondary | ICD-10-CM | POA: Diagnosis not present

## 2020-10-15 DIAGNOSIS — C8510 Unspecified B-cell lymphoma, unspecified site: Secondary | ICD-10-CM

## 2020-10-15 MED ORDER — SODIUM CHLORIDE 0.9 % IV SOLN
Freq: Once | INTRAVENOUS | Status: AC
  Administered 2020-10-15: 8 mg via INTRAVENOUS
  Filled 2020-10-15: qty 4

## 2020-10-15 MED ORDER — SODIUM CHLORIDE 0.9% FLUSH
10.0000 mL | INTRAVENOUS | Status: AC | PRN
  Administered 2020-10-15: 10 mL via INTRAVENOUS

## 2020-10-15 MED ORDER — PREDNISONE 50 MG PO TABS
100.0000 mg | ORAL_TABLET | Freq: Once | ORAL | Status: AC
  Administered 2020-10-15: 100 mg via ORAL
  Filled 2020-10-15: qty 2

## 2020-10-15 MED ORDER — VINCRISTINE SULFATE CHEMO INJECTION 1 MG/ML
Freq: Once | INTRAVENOUS | Status: AC
  Filled 2020-10-15: qty 10

## 2020-10-15 NOTE — Progress Notes (Unsigned)
Richard Cox tolerated ambulatory pump change well without complaints or incident. VSS Pt discharged with ambulatory pump infusing without issues. Pt discharged self ambulatory in satisfactory condition

## 2020-10-15 NOTE — Patient Instructions (Signed)
[facility] Discharge Instructions for Patients Receiving Chemotherapy   Beginning January 23rd 2017 lab work for the [facility] will be done in the  Main lab at [facility] on 1st floor. If you have a lab appointment with the [place] please come in thru the  Main Entrance and check in at the main information desk   Today you received the following chemotherapy agents Adriamycin,VP-16 and Vincristine. Follow-up as scheduled  To help prevent nausea and vomiting after your treatment, we encourage you to take your nausea medication   If you develop nausea and vomiting, or diarrhea that is not controlled by your medication, call the clinic.  The clinic phone number is [phone]. Office hours are Monday-Friday 8:30am-5:00pm.  BELOW ARE SYMPTOMS THAT SHOULD BE REPORTED IMMEDIATELY:  *FEVER GREATER THAN 101.0 F  *CHILLS WITH OR WITHOUT FEVER  NAUSEA AND VOMITING THAT IS NOT CONTROLLED WITH YOUR NAUSEA MEDICATION  *UNUSUAL SHORTNESS OF BREATH  *UNUSUAL BRUISING OR BLEEDING  TENDERNESS IN MOUTH AND THROAT WITH OR WITHOUT PRESENCE OF ULCERS  *URINARY PROBLEMS  *BOWEL PROBLEMS  UNUSUAL RASH Items with * indicate a potential emergency and should be followed up as soon as possible. If you have an emergency after office hours please contact your primary care physician or go to the nearest emergency department.  Please call the clinic during office hours if you have any questions or concerns.   You may also contact the Patient Navigator at [phone] should you have any questions or need assistance in obtaining follow up care.      Resources For Cancer Patients and their Caregivers ? American Cancer Society: Can assist with transportation, wigs, general needs, runs Look Good Feel Better.        858-690-3930 ? Cancer Care: Provides financial assistance, online support groups, medication/co-pay assistance.  1-800-813-HOPE 831-549-7503) ? [facility] Assists [place] cancer patients and their families through emotional , educational and financial support.  (269)182-8453 ? [place] DSS Where to apply for food stamps, Medicaid and utility assistance. 617-462-2278 ? [facility]: Transportation to medical appointments. 847-261-2077 ? Social Security Administration: May apply for disability if have a Stage IV cancer. 312-544-7152 647-871-7994 ? [employer], Disability and Transit Services: Assists with nutrition, care and transit needs. (404)560-5776

## 2020-10-16 ENCOUNTER — Inpatient Hospital Stay (HOSPITAL_COMMUNITY): Payer: 59

## 2020-10-16 ENCOUNTER — Other Ambulatory Visit: Payer: Self-pay

## 2020-10-16 ENCOUNTER — Encounter (HOSPITAL_COMMUNITY): Payer: Self-pay

## 2020-10-16 VITALS — BP 129/76 | HR 87 | Temp 97.5°F | Resp 18

## 2020-10-16 DIAGNOSIS — Z5111 Encounter for antineoplastic chemotherapy: Secondary | ICD-10-CM | POA: Diagnosis not present

## 2020-10-16 DIAGNOSIS — C8510 Unspecified B-cell lymphoma, unspecified site: Secondary | ICD-10-CM

## 2020-10-16 MED ORDER — PREDNISONE 50 MG PO TABS
100.0000 mg | ORAL_TABLET | Freq: Once | ORAL | Status: AC
  Administered 2020-10-16: 100 mg via ORAL
  Filled 2020-10-16: qty 2

## 2020-10-16 MED ORDER — SODIUM CHLORIDE 0.9 % IV SOLN: INTRAVENOUS | Status: DC

## 2020-10-16 MED ORDER — SODIUM CHLORIDE 0.9 % IV SOLN
Freq: Once | INTRAVENOUS | Status: AC
  Administered 2020-10-16: 8 mg via INTRAVENOUS
  Filled 2020-10-16: qty 4

## 2020-10-16 MED ORDER — VINCRISTINE SULFATE CHEMO INJECTION 1 MG/ML
Freq: Once | INTRAVENOUS | Status: AC
  Filled 2020-10-16: qty 10

## 2020-10-16 NOTE — Progress Notes (Signed)
Patients port checked with good blood return noted.  Chemotherapy bag completed with no complaints voiced.  Pharmacy notified of patient stating his pump finishes early.  Pump rate changed to 52ml/hr per pharmacy for today's infusion rate.  No s/s of distress noted.   Patient discharged with chemo pump and no alarms noted. Side effects with management reviewed with understanding verbalized.  Port site clean and dry with no bruising or swelling noted at site.  Good blood return noted before administration of chemotherapy bag and pump placement.  Dressing intact.  Patient left in satisfactory condition with VSS and no s/s of distress noted.

## 2020-10-17 ENCOUNTER — Inpatient Hospital Stay (HOSPITAL_COMMUNITY): Payer: 59

## 2020-10-17 VITALS — BP 113/66 | HR 87 | Temp 97.2°F | Resp 18

## 2020-10-17 DIAGNOSIS — C8510 Unspecified B-cell lymphoma, unspecified site: Secondary | ICD-10-CM

## 2020-10-17 DIAGNOSIS — Z5111 Encounter for antineoplastic chemotherapy: Secondary | ICD-10-CM | POA: Diagnosis not present

## 2020-10-17 MED ORDER — PREDNISONE 50 MG PO TABS
100.0000 mg | ORAL_TABLET | Freq: Once | ORAL | Status: AC
  Administered 2020-10-17: 100 mg via ORAL
  Filled 2020-10-17: qty 2

## 2020-10-17 MED ORDER — SODIUM CHLORIDE 0.9 % IV SOLN
Freq: Once | INTRAVENOUS | Status: AC
  Administered 2020-10-17: 8 mg via INTRAVENOUS
  Filled 2020-10-17: qty 4

## 2020-10-17 MED ORDER — VINCRISTINE SULFATE CHEMO INJECTION 1 MG/ML
Freq: Once | INTRAVENOUS | Status: AC
  Filled 2020-10-17: qty 10

## 2020-10-17 NOTE — Progress Notes (Signed)
Richard Cox presents to have home infusion pump d/c'd and restarted.  Portacath located right chest wall accessed with  H 20 needle.  Good blood return present. Home infusion pump restarted and infusing as ordered, verified by RNs x 2.  Procedure tolerated well and without incident.

## 2020-10-18 ENCOUNTER — Inpatient Hospital Stay (HOSPITAL_COMMUNITY): Payer: 59

## 2020-10-18 ENCOUNTER — Other Ambulatory Visit: Payer: Self-pay

## 2020-10-18 VITALS — BP 122/57 | HR 78 | Temp 97.3°F | Resp 18

## 2020-10-18 DIAGNOSIS — Z5111 Encounter for antineoplastic chemotherapy: Secondary | ICD-10-CM | POA: Diagnosis not present

## 2020-10-18 DIAGNOSIS — C8510 Unspecified B-cell lymphoma, unspecified site: Secondary | ICD-10-CM

## 2020-10-18 MED ORDER — PREDNISONE 50 MG PO TABS
100.0000 mg | ORAL_TABLET | Freq: Once | ORAL | Status: AC
  Administered 2020-10-18: 100 mg via ORAL
  Filled 2020-10-18: qty 2

## 2020-10-18 MED ORDER — SODIUM CHLORIDE 0.9 % IV SOLN
750.0000 mg/m2 | Freq: Once | INTRAVENOUS | Status: AC
  Administered 2020-10-18: 1420 mg via INTRAVENOUS
  Filled 2020-10-18: qty 71

## 2020-10-18 MED ORDER — SODIUM CHLORIDE 0.9 % IV SOLN: INTRAVENOUS | Status: DC

## 2020-10-18 MED ORDER — SODIUM CHLORIDE 0.9 % IV SOLN
Freq: Once | INTRAVENOUS | Status: AC
  Administered 2020-10-18: 8 mg via INTRAVENOUS
  Filled 2020-10-18: qty 8

## 2020-10-18 MED ORDER — HEPARIN SOD (PORK) LOCK FLUSH 100 UNIT/ML IV SOLN
500.0000 [IU] | Freq: Once | INTRAVENOUS | Status: AC
  Administered 2020-10-18: 500 [IU] via INTRAVENOUS

## 2020-10-18 MED FILL — SULFAMETHOXAZOLE-TMP DS TAB: 800-160 | 30 days supply | Qty: 30 | Fill #1

## 2020-10-18 NOTE — Patient Instructions (Signed)
[facility] Discharge Instructions for Patients Receiving Chemotherapy  Today you received the following chemotherapy agents   To help prevent nausea and vomiting after your treatment, we encourage you to take your nausea medication   If you develop nausea and vomiting that is not controlled by your nausea medication, call the clinic.   BELOW ARE SYMPTOMS THAT SHOULD BE REPORTED IMMEDIATELY:  *FEVER GREATER THAN 100.5 F  *CHILLS WITH OR WITHOUT FEVER  NAUSEA AND VOMITING THAT IS NOT CONTROLLED WITH YOUR NAUSEA MEDICATION  *UNUSUAL SHORTNESS OF BREATH  *UNUSUAL BRUISING OR BLEEDING  TENDERNESS IN MOUTH AND THROAT WITH OR WITHOUT PRESENCE OF ULCERS  *URINARY PROBLEMS  *BOWEL PROBLEMS  UNUSUAL RASH Items with * indicate a potential emergency and should be followed up as soon as possible.  Feel free to call the clinic should you have any questions or concerns. The clinic phone number is [phone].  Please show the CHEMO ALERT CARD at check-in to the Emergency Department and triage nurse.   

## 2020-10-18 NOTE — Progress Notes (Signed)
Patient presents today for pump d/c and Cytoxan. Upon arrival patient carrying pump and states, " my needle came out while I was sleeping but I already turned it off 1 hour prior to that." No chemo spill noted.  Patient clamped lines and states the chemotherapy was already infused and he turned the pump off an hour earlier per patient's words.   Treatment given today per MD orders. Tolerated infusion without adverse affects. Vital signs stable. No complaints at this time. Discharged from clinic ambulatory in stable condition. Alert and oriented x 3. F/U with [facility] as scheduled.

## 2020-10-21 ENCOUNTER — Other Ambulatory Visit: Payer: Self-pay

## 2020-10-21 ENCOUNTER — Inpatient Hospital Stay (HOSPITAL_COMMUNITY): Payer: 59

## 2020-10-21 ENCOUNTER — Encounter (HOSPITAL_COMMUNITY): Payer: Self-pay

## 2020-10-21 VITALS — BP 120/62 | HR 81 | Temp 97.1°F | Resp 18

## 2020-10-21 DIAGNOSIS — C8510 Unspecified B-cell lymphoma, unspecified site: Secondary | ICD-10-CM

## 2020-10-21 DIAGNOSIS — Z5111 Encounter for antineoplastic chemotherapy: Secondary | ICD-10-CM | POA: Diagnosis not present

## 2020-10-21 MED ORDER — PEGFILGRASTIM-CBQV 6 MG/0.6ML ~~LOC~~ SOSY
6.0000 mg | PREFILLED_SYRINGE | Freq: Once | SUBCUTANEOUS | Status: AC
  Administered 2020-10-21: 6 mg via SUBCUTANEOUS
  Filled 2020-10-21: qty 0.6

## 2020-10-21 NOTE — Progress Notes (Signed)
Richard Cox presents today for injection per the provider's orders. Udenyca administration without incident; injection site WNL; see MAR for injection details.  Patient tolerated procedure well and without incident.  No questions or complaints noted at this time.  Discharged ambulatory in stable condition.

## 2020-10-28 ENCOUNTER — Encounter (HOSPITAL_COMMUNITY)
Admission: RE | Admit: 2020-10-28 | Discharge: 2020-10-28 | Disposition: A | Discharge status: Home / Self Care | Payer: 59 | Source: Ambulatory Visit | Attending: Hematology and Oncology | Admitting: Hematology and Oncology

## 2020-10-28 ENCOUNTER — Other Ambulatory Visit: Payer: Self-pay

## 2020-10-28 DIAGNOSIS — C8510 Unspecified B-cell lymphoma, unspecified site: Secondary | ICD-10-CM | POA: Diagnosis present

## 2020-10-28 MED ORDER — FLUDEOXYGLUCOSE F - 18 (FDG) INJECTION
9.8300 | Freq: Once | INTRAVENOUS | Status: AC | PRN
  Administered 2020-10-28: 9.83 via INTRAVENOUS

## 2020-10-31 MED FILL — BIKTARVY 50-200-25 MG TABS: 50-200-25 | 30 days supply | Qty: 30 | Fill #4

## 2020-10-31 NOTE — Progress Notes (Signed)
Patient has appointment scheduled 11/04/2020 with Dr. Delton Coombes. Results will be gone over with patient at that appointment.

## 2020-11-04 ENCOUNTER — Other Ambulatory Visit: Payer: Self-pay

## 2020-11-04 ENCOUNTER — Inpatient Hospital Stay (HOSPITAL_COMMUNITY): Payer: 59

## 2020-11-04 ENCOUNTER — Inpatient Hospital Stay (HOSPITAL_BASED_OUTPATIENT_CLINIC_OR_DEPARTMENT_OTHER): Payer: 59 | Admitting: Hematology

## 2020-11-04 ENCOUNTER — Other Ambulatory Visit (HOSPITAL_COMMUNITY): Payer: Self-pay

## 2020-11-04 VITALS — BP 137/80 | HR 74 | Temp 97.0°F | Resp 18

## 2020-11-04 VITALS — BP 141/73 | HR 85 | Temp 97.2°F | Resp 18 | Wt 162.4 lb

## 2020-11-04 DIAGNOSIS — C8510 Unspecified B-cell lymphoma, unspecified site: Secondary | ICD-10-CM

## 2020-11-04 DIAGNOSIS — Z5111 Encounter for antineoplastic chemotherapy: Secondary | ICD-10-CM | POA: Diagnosis not present

## 2020-11-04 LAB — CBC WITH DIFFERENTIAL/PLATELET
Abs Immature Granulocytes: 0.11 10*3/uL — ABNORMAL HIGH (ref 0.00–0.07)
Basophils Absolute: 0 10*3/uL (ref 0.0–0.1)
Basophils Relative: 0 %
Eosinophils Absolute: 0 10*3/uL (ref 0.0–0.5)
Eosinophils Relative: 0 %
HCT: 33.6 % — ABNORMAL LOW (ref 39.0–52.0)
Hemoglobin: 10.7 g/dL — ABNORMAL LOW (ref 13.0–17.0)
Immature Granulocytes: 1 %
Lymphocytes Relative: 12 %
Lymphs Abs: 1.1 10*3/uL (ref 0.7–4.0)
MCH: 30.3 pg (ref 26.0–34.0)
MCHC: 31.8 g/dL (ref 30.0–36.0)
MCV: 95.2 fL (ref 80.0–100.0)
Monocytes Absolute: 0.7 10*3/uL (ref 0.1–1.0)
Monocytes Relative: 8 %
Neutro Abs: 6.8 10*3/uL (ref 1.7–7.7)
Neutrophils Relative %: 79 %
Platelets: 128 10*3/uL — ABNORMAL LOW (ref 150–400)
RBC: 3.53 MIL/uL — ABNORMAL LOW (ref 4.22–5.81)
RDW: 21.5 % — ABNORMAL HIGH (ref 11.5–15.5)
WBC: 8.7 10*3/uL (ref 4.0–10.5)
nRBC: 0.7 % — ABNORMAL HIGH (ref 0.0–0.2)

## 2020-11-04 LAB — COMPREHENSIVE METABOLIC PANEL
ALT: 18 U/L (ref 0–44)
AST: 24 U/L (ref 15–41)
Albumin: 4.1 g/dL (ref 3.5–5.0)
Alkaline Phosphatase: 72 U/L (ref 38–126)
Anion gap: 11 (ref 5–15)
BUN: 20 mg/dL (ref 6–20)
CO2: 23 mmol/L (ref 22–32)
Calcium: 9.3 mg/dL (ref 8.9–10.3)
Chloride: 104 mmol/L (ref 98–111)
Creatinine, Ser: 1.6 mg/dL — ABNORMAL HIGH (ref 0.61–1.24)
GFR, Estimated: 57 mL/min — ABNORMAL LOW (ref >60)
Glucose, Bld: 137 mg/dL — ABNORMAL HIGH (ref 70–99)
Potassium: 3.6 mmol/L (ref 3.5–5.1)
Sodium: 138 mmol/L (ref 135–145)
Total Bilirubin: 0.5 mg/dL (ref 0.3–1.2)
Total Protein: 7.2 g/dL (ref 6.5–8.1)

## 2020-11-04 LAB — PHOSPHORUS: Phosphorus: 4.8 mg/dL — ABNORMAL HIGH (ref 2.5–4.6)

## 2020-11-04 LAB — LACTATE DEHYDROGENASE: LDH: 179 U/L (ref 98–192)

## 2020-11-04 LAB — URIC ACID: Uric Acid, Serum: 8 mg/dL (ref 3.7–8.6)

## 2020-11-04 MED ORDER — DIPHENHYDRAMINE HCL 50 MG/ML IJ SOLN
50.0000 mg | Freq: Once | INTRAMUSCULAR | Status: AC
  Administered 2020-11-04: 50 mg via INTRAVENOUS
  Filled 2020-11-04: qty 1

## 2020-11-04 MED ORDER — SODIUM CHLORIDE 0.9 % IV SOLN
375.0000 mg/m2 | Freq: Once | INTRAVENOUS | Status: AC
  Administered 2020-11-04: 700 mg via INTRAVENOUS
  Filled 2020-11-04: qty 50

## 2020-11-04 MED ORDER — SODIUM CHLORIDE 0.9 % IV SOLN: INTRAVENOUS | Status: DC

## 2020-11-04 MED ORDER — VINCRISTINE SULFATE CHEMO INJECTION 1 MG/ML
Freq: Once | INTRAVENOUS | Status: AC
  Filled 2020-11-04: qty 10

## 2020-11-04 MED ORDER — SODIUM CHLORIDE 0.9% FLUSH
10.0000 mL | INTRAVENOUS | Status: DC | PRN
  Administered 2020-11-04: 10 mL

## 2020-11-04 MED ORDER — SODIUM CHLORIDE 0.9 % IV SOLN
Freq: Once | INTRAVENOUS | Status: AC
  Administered 2020-11-04: 8 mg via INTRAVENOUS
  Filled 2020-11-04: qty 4

## 2020-11-04 MED ORDER — ACETAMINOPHEN 325 MG PO TABS
650.0000 mg | ORAL_TABLET | Freq: Once | ORAL | Status: AC
  Administered 2020-11-04: 650 mg via ORAL
  Filled 2020-11-04: qty 2

## 2020-11-04 MED ORDER — FAMOTIDINE IN NACL 20-0.9 MG/50ML-% IV SOLN
20.0000 mg | Freq: Once | INTRAVENOUS | Status: AC
  Administered 2020-11-04: 20 mg via INTRAVENOUS
  Filled 2020-11-04: qty 50

## 2020-11-04 NOTE — Patient Instructions (Addendum)
[facility] at [facility] Discharge Instructions  You were seen today by Dr. Ellin Saba. He went over your recent results. You received your treatment today; continue getting your daily treatments. Drink plenty of water daily to keep your kidneys flushed. Keep with your appointment for your echocardiogram. Dr. Ellin Saba will see you back in 3 weeks for labs and follow up.   Thank you for choosing [facility] at [facility] to provide your oncology and hematology care.  To afford each patient quality time with our provider, please arrive at least 15 minutes before your scheduled appointment time.   If you have a lab appointment with the Cancer Center please come in thru the Main Entrance and check in at the main information desk  You need to re-schedule your appointment should you arrive 10 or more minutes late.  We strive to give you quality time with our providers, and arriving late affects you and other patients whose appointments are after yours.  Also, if you no show three or more times for appointments you may be dismissed from the clinic at the providers discretion.     Again, thank you for choosing [facility].  Our hope is that these requests will decrease the amount of time that you wait before being seen by our physicians.       _____________________________________________________________  Should you have questions after your visit to [facility], please contact our office at [phone] between the hours of 8:00 a.m. and 4:30 p.m.  Voicemails left after 4:00 p.m. will not be returned until the following business day.  For prescription refill requests, have your pharmacy contact our office and allow 72 hours.    Cancer Center Support Programs:   > Cancer Support Group  2nd Tuesday of the month 1pm-2pm, Journey Room

## 2020-11-04 NOTE — Progress Notes (Unsigned)
Serum Creat 1.6 today for oncology follow up and chemotherapy.  Patient to receive Normal Saline 500 ml for treatment today and Ok to treat today verbal order Dr. Ellin Saba.

## 2020-11-04 NOTE — Progress Notes (Unsigned)
Infusions tolerated without incident or complaint. VSS upon completion of treatment. Port flushed and [facility] infusing via home infusion pump with RUN noted on screen, see MAR and IV flowsheet for details. Discharged in satisfactory condition with follow up instructions.

## 2020-11-04 NOTE — Progress Notes (Signed)
[place] [place], [place] [address]   CLINIC:  Medical Oncology/Hematology  PCP:  Lindell Spar, MD [address] / [place] [address] [phone]   REASON FOR VISIT:  Follow-up for high-grade B-cell lymphoma  PRIOR THERAPY: None  NGS Results: [employer] BCL6 and MYC rearrangement detected  CURRENT THERAPY: R-EPOCH every 3 weeks  BRIEF ONCOLOGIC HISTORY:  Oncology History  High grade B-cell lymphoma ([place])  08/21/2020 Initial Diagnosis   High grade B-cell lymphoma ([place])   09/02/2020 -  Chemotherapy   The patient had pegfilgrastim-cbqv (UDENYCA) injection 6 mg, 6 mg, Subcutaneous, Once, 4 of 6 cycles Administration: 6 mg (09/09/2020), 6 mg (09/30/2020), 6 mg (10/21/2020) DOXOrubicin (ADRIAMYCIN) 20 mg, etoposide (VEPESID) 96 mg, vinCRIStine (ONCOVIN) 0.8 mg in sodium chloride 0.9 % 500 mL chemo infusion, , Intravenous, Once, 4 of 6 cycles Administration:  (09/02/2020),  (09/03/2020),  (09/23/2020),  (09/24/2020),  (09/04/2020),  (09/05/2020),  (10/14/2020),  (09/25/2020),  (09/26/2020),  (10/15/2020),  (10/16/2020),  (10/17/2020) ondansetron (ZOFRAN) 8 mg, dexamethasone (DECADRON) 10 mg in sodium chloride 0.9 % 50 mL IVPB, , Intravenous,  Once, 4 of 6 cycles Administration: 8 mg (09/02/2020), 8 mg (09/03/2020), 16 mg (09/06/2020), 8 mg (09/23/2020), 8 mg (09/24/2020), 16 mg (09/27/2020), 8 mg (09/04/2020), 8 mg (09/05/2020), 8 mg (10/14/2020), 8 mg (09/25/2020), 8 mg (09/26/2020), 8 mg (10/15/2020), 8 mg (10/18/2020), 8 mg (10/16/2020), 8 mg (10/17/2020) cyclophosphamide (CYTOXAN) 1,420 mg in sodium chloride 0.9 % 250 mL chemo infusion, 750 mg/m2 = 1,420 mg, Intravenous,  Once, 4 of 6 cycles Administration: 1,420 mg (09/06/2020), 1,420 mg (09/27/2020), 1,420 mg (10/18/2020) riTUXimab-pvvr (RUXIENCE) 700 mg in sodium chloride 0.9 % 250 mL (2.1875 mg/mL) infusion, 375 mg/m2 = 700 mg, Intravenous,  Once, 4 of 6 cycles Administration: 700 mg  (09/02/2020), 700 mg (09/23/2020), 700 mg (10/14/2020)  for chemotherapy treatment.    09/02/2020 Genetic Testing         CANCER STAGING: Cancer Staging No matching staging information was found for the patient.  INTERVAL HISTORY:  Richard Cox, a 38 y.o. male, returns for routine follow-up and consideration for next cycle of chemotherapy. Richard Cox was last seen on 10/14/2020.  Due for cycle #4 of R-EPOCH today.   Overall, he tells me he has been feeling pretty well. He tolerated the previous treatment well and reports having some N/V, but denies F/C, diarrhea, numbness or tingling, orthopnea, SOB or leg swelling. His appetite has improved since starting treatment. He takes oxycodone as needed for his pain, about every 2-3 days. He continues taking Biktarvy.  Overall, he feels ready for next cycle of chemo today.    REVIEW OF SYSTEMS:  Review of Systems  Constitutional: Positive for fatigue (90%). Negative for appetite change, chills and fever.  Respiratory: Negative for shortness of breath.   Cardiovascular: Negative for leg swelling.  Gastrointestinal: Positive for nausea and vomiting. Negative for diarrhea.  Neurological: Negative for numbness.  All other systems reviewed and are negative.   PAST MEDICAL/SURGICAL HISTORY:  Past Medical History:  Diagnosis Date  . CHF (congestive heart failure) ([place])   . Essential hypertension   . HIV infection ([place])   . Noncompliance with medication regimen   . 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 IR IMAGING GUIDED PORT INSERTION  08/23/2020   Right  . NO PAST SURGERIES      SOCIAL HISTORY:  Social History   Socioeconomic History  .  Marital status: Single    Spouse name: Not on file  . Number of children: 0  . Years of education: 44  . Highest education level: Not on file  Occupational History  . Not on file  Tobacco Use  . Smoking status: Light Tobacco Smoker     Packs/day: 0.25    Types: Cigarettes  . Smokeless tobacco: Never Used  Vaping Use  . Vaping Use: Never used  Substance and Sexual Activity  . Alcohol use: Not Currently  . Drug use: Not Currently    Types: Marijuana    Comment: x 1 week  . Sexual activity: Not Currently    Comment: pt given condoms  Other Topics Concern  . Not on file  Social History Narrative  . Not on file   Social Determinants of Health   Financial Resource Strain: Low Risk   . Difficulty of Paying Living Expenses: Not very hard  Food Insecurity: No Food Insecurity  . Worried About Charity fundraiser in the Last Year: Never true  . Ran Out of Food in the Last Year: Never true  Transportation Needs: No Transportation Needs  . Lack of Transportation (Medical): No  . Lack of Transportation (Non-Medical): No  Physical Activity: Insufficiently Active  . Days of Exercise per Week: 5 days  . Minutes of Exercise per Session: 20 min  Stress: No Stress Concern Present  . Feeling of Stress : Only a little  Social Connections: Socially Isolated  . Frequency of Communication with Friends and Family: More than three times a week  . Frequency of Social Gatherings with Friends and Family: Never  . Attends Religious Services: Never  . Active Member of Clubs or Organizations: No  . Attends Archivist Meetings: Not on file  . Marital Status: Never married  Intimate Partner Violence: Not At Risk  . Fear of Current or Ex-Partner: No  . Emotionally Abused: No  . Physically Abused: No  . Sexually Abused: No    FAMILY HISTORY:  Family History  Problem Relation Age of Onset  . Diabetes Mellitus II Mother   . Cancer Mother        multiple myeloma  . Cancer Father        multiple myeloma  . Congestive Heart Failure Brother   . Heart Problems Brother   . Lupus Brother     CURRENT MEDICATIONS:  Current Outpatient Medications  Medication Sig Dispense Refill  . acetaminophen (TYLENOL) 500 MG tablet Take  1,000 mg by mouth every 6 (six) hours as needed for moderate pain or headache.    . allopurinol (ZYLOPRIM) 300 MG tablet Take 1 tablet (300 mg total) by mouth daily. 30 tablet 3  . bictegravir-emtricitabine-tenofovir AF (BIKTARVY) 50-200-25 MG TABS tablet Take 1 tablet by mouth daily. 30 tablet 5  . carvedilol (COREG) 6.25 MG tablet Take 1 tablet (6.25 mg total) by mouth 2 (two) times daily with a meal. For BP and Heart (Patient taking differently: Take 6.25 mg by mouth 2 (two) times daily with a meal.) 60 tablet 11  . dicyclomine (BENTYL) 10 MG capsule     . docusate sodium (COLACE) 100 MG capsule Take 100 mg by mouth daily as needed for mild constipation.    . furosemide (LASIX) 40 MG tablet Take 1 tablet (40 mg total) by mouth daily. 30 tablet 3  . hydrALAZINE (APRESOLINE) 50 MG tablet Take 50 mg by mouth 3 (three) times daily.    . isosorbide mononitrate (IMDUR)  30 MG 24 hr tablet Take 30 mg by mouth daily.    Marland Kitchen lidocaine-prilocaine (EMLA) cream Apply 1 application topically as needed. 30 g 0  . losartan (COZAAR) 50 MG tablet Take 1 tablet (50 mg total) by mouth daily. 90 tablet 3  . ondansetron (ZOFRAN-ODT) 4 MG disintegrating tablet     . oxyCODONE (OXY IR/ROXICODONE) 5 MG immediate release tablet Take 2 tablets (10 mg total) by mouth every 12 (twelve) hours as needed for severe pain. 120 tablet 0  . potassium chloride (KLOR-CON) 20 MEQ tablet Take 0.5 tablets (10 mEq total) by mouth daily. 30 tablet 1  . prochlorperazine (COMPAZINE) 10 MG tablet Take 1 tablet (10 mg total) by mouth every 6 (six) hours as needed for nausea or vomiting. 30 tablet 2  . sulfamethoxazole-trimethoprim (BACTRIM DS) 800-160 MG tablet Take 1 tablet by mouth daily. 30 tablet 5   No current facility-administered medications for this visit.   Facility-Administered Medications Ordered in Other Visits  Medication Dose Route Frequency Provider Last Rate Last Admin  . 0.9 %  sodium chloride infusion   Intravenous  Continuous Derek Jack, MD      . 0.9 %  sodium chloride infusion   Intravenous Continuous Derek Jack, MD   Stopped at 09/26/20 1124  . 0.9 %  sodium chloride infusion   Intravenous Continuous Derek Jack, MD      . 0.9 %  sodium chloride infusion   Intravenous Continuous Derek Jack, MD      . acetaminophen (TYLENOL) tablet 650 mg  650 mg Oral Once Derek Jack, MD      . diphenhydrAMINE (BENADRYL) injection 50 mg  50 mg Intravenous Once Derek Jack, MD      . DOXOrubicin (ADRIAMYCIN) 20 mg, etoposide (VEPESID) 76 mg, vinCRIStine (ONCOVIN) 0.8 mg in sodium chloride 0.9 % 500 mL chemo infusion   Intravenous Once Derek Jack, MD      . famotidine (PEPCID) IVPB 20 mg premix  20 mg Intravenous Once Derek Jack, MD      . ondansetron (ZOFRAN) 8 mg, dexamethasone (DECADRON) 10 mg in sodium chloride 0.9 % 50 mL IVPB   Intravenous Once Derek Jack, MD      . riTUXimab-pvvr (RUXIENCE) 700 mg in sodium chloride 0.9 % 250 mL (2.1875 mg/mL) infusion  375 mg/m2 (Treatment Plan Recorded) Intravenous Once Derek Jack, MD      . sodium chloride flush (NS) 0.9 % injection 10 mL  10 mL Intravenous PRN Derek Jack, MD   10 mL at 10/15/20 1035  . sodium chloride flush (NS) 0.9 % injection 10 mL  10 mL Intracatheter PRN Derek Jack, MD        ALLERGIES:  Allergies  Allergen Reactions  . Neosporin [Neomycin-Bacitracin Zn-Polymyx] Rash    PHYSICAL EXAM:  Performance status (ECOG): 0 - Asymptomatic  Vitals:   11/04/20 0841  BP: (!) 141/73  Pulse: 85  Resp: 18  Temp: (!) 97.2 F (36.2 C)  SpO2: 100%   Wt Readings from Last 3 Encounters:  11/04/20 162 lb 6.4 oz (73.7 kg)  09/09/20 157 lb 3 oz (71.3 kg)  09/02/20 157 lb 14.4 oz (71.6 kg)   Physical Exam Vitals reviewed.  Constitutional:      Appearance: Normal appearance.  Cardiovascular:     Rate and Rhythm: Normal rate and regular rhythm.      Pulses: Normal pulses.     Heart sounds: Normal heart sounds.  Pulmonary:     Effort: Pulmonary effort  is normal.     Breath sounds: Normal breath sounds.  Chest:     Comments: Port-a-Cath in R chest Abdominal:     Palpations: Abdomen is soft.     Tenderness: There is no abdominal tenderness.  Musculoskeletal:     Right lower leg: No edema.     Left lower leg: No edema.  Neurological:     General: No focal deficit present.     Mental Status: He is alert and oriented to person, place, and time.  Psychiatric:        Mood and Affect: Mood normal.        Behavior: Behavior normal.     LABORATORY DATA:  I have reviewed the labs as listed.  CBC Latest Ref Rng & Units 11/04/2020 10/14/2020 09/23/2020  WBC 4.0 - 10.5 K/uL 8.7 6.9 8.0  Hemoglobin 13.0 - 17.0 g/dL 10.7(L) 10.3(L) 10.8(L)  Hematocrit 39.0 - 52.0 % 33.6(L) 31.9(L) 33.9(L)  Platelets 150 - 400 K/uL 128(L) 120(L) 161   CMP Latest Ref Rng & Units 11/04/2020 10/14/2020 09/23/2020  Glucose 70 - 99 mg/dL 137(H) 147(H) 171(H)  BUN 6 - 20 mg/dL '20 18 13  ' Creatinine 0.61 - 1.24 mg/dL 1.60(H) 1.20 1.23  Sodium 135 - 145 mmol/L 138 136 138  Potassium 3.5 - 5.1 mmol/L 3.6 3.6 3.4(L)  Chloride 98 - 111 mmol/L 104 105 105  CO2 22 - 32 mmol/L '23 22 22  ' Calcium 8.9 - 10.3 mg/dL 9.3 8.8(L) 8.8(L)  Total Protein 6.5 - 8.1 g/dL 7.2 6.8 7.1  Total Bilirubin 0.3 - 1.2 mg/dL 0.5 0.3 0.2(L)  Alkaline Phos 38 - 126 U/L 72 75 92  AST 15 - 41 U/L '24 18 20  ' ALT 0 - 44 U/L '18 15 17   ' Lab Results  Component Value Date   LDH 179 11/04/2020   LDH 155 10/14/2020   LDH 168 09/23/2020    DIAGNOSTIC IMAGING:  I have independently reviewed the scans and discussed with the patient. NM PET Image Restag (PS) Skull Base To Thigh  Result Date: 10/29/2020 CLINICAL DATA:  Subsequent treatment strategy for lymphoma. EXAM: NUCLEAR MEDICINE PET SKULL BASE TO THIGH TECHNIQUE: 9.8 mCi F-18 FDG was injected intravenously. Full-ring PET imaging was  performed from the skull base to thigh after the radiotracer. CT data was obtained and used for attenuation correction and anatomic localization. Fasting blood glucose: 100 mg/dl COMPARISON:  07/31/2020 FINDINGS: Mediastinal blood pool activity: SUV max 1.6 Liver activity: SUV max 2.3 NECK: Hypermetabolic uptake identified anterior floor of mouth just posterior to the mandibular symphysis. Nonspecific. No evidence for hypermetabolic lymphadenopathy in the neck. Incidental CT findings: none CHEST: The tiny right axillary node with low level hypermetabolism seen previously has resolved in the interval. The broad-based right middle lobe pleural-based hypermetabolic nodule seen previously has resolved completely in the interval. No new hypermetabolic disease in the chest today. Incidental CT findings: Right Port-A-Cath tip is positioned at the SVC/RA junction. Stable appearance thymic remnant anterior mediastinum. Most of the tiny bilateral pulmonary parenchymal nodule seen previously have resolved although some appear decreased but persistent (see subpleural anterior right lung on 102/3 and perifissural left lower lobe on 113/3). ABDOMEN/PELVIS: Bulky disease in the left upper quadrant seen previously has decreased substantially in the interval. Left upper quadrant mass measured previously at 13.8 x 12.5 now measures approximately 4.5 x 3.5 cm (178/3) with SUV max = 4.5 Index right mesenteric soft tissue mass measured previously at 8.9 x 6.2 cm is difficult  to discern on today's CT scan performed without oral or intravenous contrast but may be visible on image 230/3 with measurements of 1.9 x 1.6 cm and SUV max = 2.1. 2.8 x 3.2 cm lesion identified previously in the right paracolic gutter has resolved completely in the interval with no residual hypermetabolism on today's study. No new sites of hypermetabolism in the abdomen/pelvis today. Incidental CT findings: No free fluid. Spleen remains enlarged at 16 cm  craniocaudal length today compared to 14.8 cm previously. SKELETON: Diffuse low level marrow uptake presumably secondary to stimulatory effects of therapy. Incidental CT findings: none IMPRESSION: 1. Marked interval response to therapy. Most of the sites of hypermetabolic disease seen previously show no residual hypermetabolism today. 1 of the more dominant foci of disease in the left upper quadrant of the abdomen does show low level hypermetabolism compatible with Deauville 4. 2. No new hypermetabolic disease on today's exam. 3. Persistent splenomegaly without hypermetabolism in the splenic parenchyma. Electronically Signed   By: Misty Stanley M.D.   On: 10/29/2020 10:04     ASSESSMENT:  1.B-cell high-grade lymphoma: -Presentation to the ER with left upper quadrant and mid abdominal pain for 2 weeks. -10 pound weight loss in the last 2 weeks due to decreased appetite. Denies any fevers or night sweats. -CT AP with contrast on 07/24/2020 shows 13.8 11.2 cm mass in the epigastric region, lobulated large mass in the mesenteric region, 9.3 x 5.1 cm mass in the inferior pelvis, 9.7 x 4.1 cm mass in the superior pelvis. Epigastric mass seems to abut posterior spleen. -LDH was elevated at 309, uric acid 10.2. -PET scan on 07/31/2020 showed extensive multifocal soft tissue masses within the abdomen and pelvis, most of them between SUV 15 and 20. FDG avid subpleural nodules overlying the right lung. Splenomegaly. Multiple scattered lung nodules, nonspecific. -Biopsy of the soft tissue mass in the upper abdomen on 08/15/2020 consistent with high-grade B-cell lymphoma. IHC positive for CD20, CD10, BCL6, negative for BCL-2, CD3, CD5, CD30, mum 1 and EBV. Ki-67 is 80-90%. 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 -He was evaluated by Dr. Lonia Blood at [facility] and was not felt to be a candidate for clinical trial. -Cycle 1 of R-EPOCH on  09/05/2020. -PET scan on 10/28/2020 showed marked interval response with most of the sites of hypermetabolic disease with no residual hypermetabolism.  One of the more dominant foci of disease in the left upper quadrant of the abdomen does show low-level of hypermetabolism compatible with Deauville 4.  No new hypermetabolic disease.  Persistent splenomegaly without hypermetabolism in the parenchyma.  2. Social/family history: -He quit smoking last year. -Family history significant for mother and father who had multiple myeloma. Grandfather had colon cancer.  3. 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  4. CHF: -Echocardiogram on 09/16/2020 with EF 50%. -Echocardiogram on 08/22/2020 with EF 45-50%. -Echocardiogram on 09/18/2019 shows EF 40-45%. -He is on Coreg 6.25 mg twice daily, Imdur 30 mg daily. Lasix 40 mg is an expired medication list.   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 -Reviewed labs today.  Creatinine is elevated at 1.6. -He will receive 500 mL of normal saline.  I will cut back on today's dose of etoposide by 20%. -We will plan to recheck creatinine tomorrow.  If GFR improves more than 60 mL/min, will  give etoposide 100% dose. -Reviewed his medications which showed Lasix and ACE inhibitor likely contributing to elevated creatinine. -Proceed with his next cycle today.  RTC 3 weeks for follow-up. -Repeat echocardiogram prior to next visit.  2. Abdominal pain: -This has improved since chemotherapy started. -Continue oxycodone as needed once every 2 to 3 days.  3. Nausea: -Continue Zofran ODT 4 mg every 8 hours as needed.  4. Hyperuricemia: -Uric acid is 8.0.  Allopurinol was discontinued.  We will closely monitor.  5.  Hypokalemia: -Continue K-Dur 20 mEq daily.   Orders placed this encounter:  No orders of the defined types were placed in this  encounter.    Derek Jack, MD [place] [phone]   I, Milinda Antis, am acting as a scribe for Dr. Sanda Linger.  I, Derek Jack MD, have reviewed the above documentation for accuracy and completeness, and I agree with the above.

## 2020-11-04 NOTE — Patient Instructions (Signed)
[facility] Discharge Instructions for Patients Receiving Chemotherapy  Today you received the following chemotherapy agents Rituxan and EPOCH.  Drink fluids as directed.   To help prevent nausea and vomiting after your treatment, we encourage you to take your nausea medication as directed.     If you develop nausea and vomiting that is not controlled by your nausea medication, call the clinic.   BELOW ARE SYMPTOMS THAT SHOULD BE REPORTED IMMEDIATELY:  *FEVER GREATER THAN 100.5 F  *CHILLS WITH OR WITHOUT FEVER  NAUSEA AND VOMITING THAT IS NOT CONTROLLED WITH YOUR NAUSEA MEDICATION  *UNUSUAL SHORTNESS OF BREATH  *UNUSUAL BRUISING OR BLEEDING  TENDERNESS IN MOUTH AND THROAT WITH OR WITHOUT PRESENCE OF ULCERS  *URINARY PROBLEMS  *BOWEL PROBLEMS  UNUSUAL RASH Items with * indicate a potential emergency and should be followed up as soon as possible.  Feel free to call the clinic should you have any questions or concerns. The clinic phone number is [phone].  Please show the CHEMO ALERT CARD at check-in to the Emergency Department and triage nurse.

## 2020-11-05 ENCOUNTER — Encounter (HOSPITAL_COMMUNITY): Payer: Self-pay

## 2020-11-05 ENCOUNTER — Inpatient Hospital Stay (HOSPITAL_COMMUNITY): Payer: 59

## 2020-11-05 VITALS — BP 146/83 | HR 78 | Temp 97.7°F | Resp 18

## 2020-11-05 DIAGNOSIS — Z5111 Encounter for antineoplastic chemotherapy: Secondary | ICD-10-CM | POA: Diagnosis not present

## 2020-11-05 DIAGNOSIS — C8510 Unspecified B-cell lymphoma, unspecified site: Secondary | ICD-10-CM

## 2020-11-05 MED ORDER — VINCRISTINE SULFATE CHEMO INJECTION 1 MG/ML
Freq: Once | INTRAVENOUS | Status: AC
  Filled 2020-11-05: qty 10

## 2020-11-05 MED ORDER — SODIUM CHLORIDE 0.9 % IV SOLN: Freq: Once | INTRAVENOUS | Status: AC

## 2020-11-05 MED ORDER — HEPARIN SOD (PORK) LOCK FLUSH 100 UNIT/ML IV SOLN: 500.0000 [IU] | Freq: Once | INTRAVENOUS | Status: DC

## 2020-11-05 MED ORDER — SODIUM CHLORIDE 0.9% FLUSH
10.0000 mL | Freq: Once | INTRAVENOUS | Status: AC
  Administered 2020-11-05: 10 mL via INTRAVENOUS

## 2020-11-05 MED ORDER — PREDNISONE 50 MG PO TABS
100.0000 mg | ORAL_TABLET | Freq: Once | ORAL | Status: AC
  Administered 2020-11-05: 100 mg via ORAL
  Filled 2020-11-05: qty 2

## 2020-11-05 MED ORDER — SODIUM CHLORIDE 0.9 % IV SOLN
Freq: Once | INTRAVENOUS | Status: AC
  Administered 2020-11-05: 8 mg via INTRAVENOUS
  Filled 2020-11-05: qty 4

## 2020-11-05 NOTE — Progress Notes (Signed)
Patients port checked with good blood return noted.  No complaints voiced.  No s/s of distress noted.   Patient connected to chemotherapy pump with no alarms noted.  See MAR for details.  Port site clean and dry with no bruising or swelling noted at site. Good blood return noted.  Tolerated procedure with no complaints voiced.  Discharged in stable condition with no s/s of distress noted.

## 2020-11-06 ENCOUNTER — Other Ambulatory Visit: Payer: Self-pay | Admitting: *Deleted

## 2020-11-06 ENCOUNTER — Encounter (HOSPITAL_COMMUNITY): Payer: Self-pay

## 2020-11-06 ENCOUNTER — Inpatient Hospital Stay (HOSPITAL_COMMUNITY): Payer: 59

## 2020-11-06 ENCOUNTER — Other Ambulatory Visit: Payer: Self-pay

## 2020-11-06 VITALS — BP 140/81 | HR 76 | Temp 97.0°F | Resp 18

## 2020-11-06 DIAGNOSIS — C8510 Unspecified B-cell lymphoma, unspecified site: Secondary | ICD-10-CM

## 2020-11-06 DIAGNOSIS — Z5111 Encounter for antineoplastic chemotherapy: Secondary | ICD-10-CM | POA: Diagnosis not present

## 2020-11-06 MED ORDER — PREDNISONE 50 MG PO TABS
100.0000 mg | ORAL_TABLET | Freq: Once | ORAL | Status: AC
  Administered 2020-11-06: 100 mg via ORAL
  Filled 2020-11-06: qty 2

## 2020-11-06 MED ORDER — SODIUM CHLORIDE 0.9 % IV SOLN: INTRAVENOUS | Status: DC

## 2020-11-06 MED ORDER — VINCRISTINE SULFATE CHEMO INJECTION 1 MG/ML
Freq: Once | INTRAVENOUS | Status: AC
  Filled 2020-11-06: qty 10

## 2020-11-06 MED ORDER — SODIUM CHLORIDE 0.9 % IV SOLN
Freq: Once | INTRAVENOUS | Status: AC
  Administered 2020-11-06: 8 mg via INTRAVENOUS
  Filled 2020-11-06: qty 4

## 2020-11-07 ENCOUNTER — Inpatient Hospital Stay (HOSPITAL_COMMUNITY): Payer: 59

## 2020-11-07 ENCOUNTER — Encounter (HOSPITAL_COMMUNITY): Payer: Self-pay

## 2020-11-07 VITALS — BP 124/75 | HR 75 | Temp 97.4°F | Resp 17

## 2020-11-07 DIAGNOSIS — Z5111 Encounter for antineoplastic chemotherapy: Secondary | ICD-10-CM | POA: Diagnosis not present

## 2020-11-07 DIAGNOSIS — C8510 Unspecified B-cell lymphoma, unspecified site: Secondary | ICD-10-CM

## 2020-11-07 MED ORDER — SODIUM CHLORIDE 0.9 % IV SOLN: INTRAVENOUS | Status: DC

## 2020-11-07 MED ORDER — PREDNISONE 50 MG PO TABS
100.0000 mg | ORAL_TABLET | Freq: Once | ORAL | Status: AC
  Administered 2020-11-07: 100 mg via ORAL
  Filled 2020-11-07: qty 2

## 2020-11-07 MED ORDER — VINCRISTINE SULFATE CHEMO INJECTION 1 MG/ML
Freq: Once | INTRAVENOUS | Status: AC
  Filled 2020-11-07: qty 10

## 2020-11-07 MED ORDER — SODIUM CHLORIDE 0.9% FLUSH
10.0000 mL | INTRAVENOUS | Status: AC | PRN
  Administered 2020-11-07: 10 mL via INTRAVENOUS

## 2020-11-07 MED ORDER — SODIUM CHLORIDE 0.9 % IV SOLN
Freq: Once | INTRAVENOUS | Status: AC
  Administered 2020-11-07: 8 mg via INTRAVENOUS
  Filled 2020-11-07: qty 4

## 2020-11-07 NOTE — Patient Instructions (Signed)
[facility] Discharge Instructions for Patients Receiving Chemotherapy   Beginning January 23rd 2017 lab work for the [facility] will be done in the  Main lab at [facility] on 1st floor. If you have a lab appointment with the Cancer Center please come in thru the  Main Entrance and check in at the main information desk   Today you received the following chemotherapy agents Adraimycin,VP-16 and Vincristine ambulatory pump. Follow-up as sheduled  To help prevent nausea and vomiting after your treatment, we encourage you to take your nausea medication   If you develop nausea and vomiting, or diarrhea that is not controlled by your medication, call the clinic.  The clinic phone number is [phone]. Office hours are Monday-Friday 8:30am-5:00pm.  BELOW ARE SYMPTOMS THAT SHOULD BE REPORTED IMMEDIATELY:  *FEVER GREATER THAN 101.0 F  *CHILLS WITH OR WITHOUT FEVER  NAUSEA AND VOMITING THAT IS NOT CONTROLLED WITH YOUR NAUSEA MEDICATION  *UNUSUAL SHORTNESS OF BREATH  *UNUSUAL BRUISING OR BLEEDING  TENDERNESS IN MOUTH AND THROAT WITH OR WITHOUT PRESENCE OF ULCERS  *URINARY PROBLEMS  *BOWEL PROBLEMS  UNUSUAL RASH Items with * indicate a potential emergency and should be followed up as soon as possible. If you have an emergency after office hours please contact your primary care physician or go to the nearest emergency department.  Please call the clinic during office hours if you have any questions or concerns.   You may also contact the Patient Navigator at [phone] should you have any questions or need assistance in obtaining follow up care.      Resources For Cancer Patients and their Caregivers ? American Cancer Society: Can assist with transportation, wigs, general needs, runs Look Good Feel Better.        819-554-7028 ? Cancer Care: Provides financial assistance, online support groups, medication/co-pay assistance.  1-800-813-HOPE  813-597-4762) ? [facility] Assists [place] cancer patients and their families through emotional , educational and financial support.  817-331-7952 ? [place] DSS Where to apply for food stamps, Medicaid and utility assistance. (564) 685-9930 ? [facility]: Transportation to medical appointments. 312-463-7813 ? Social Security Administration: May apply for disability if have a Stage IV cancer. 803-121-1079 602 780 7329 ? [employer], Disability and Transit Services: Assists with nutrition, care and transit needs. ([phone]

## 2020-11-07 NOTE — Progress Notes (Unsigned)
Richard Cox tolerated ambulatory pump well without complaints or incident. Adriamycin,VP-16 and Vincristine ambulatory pump changed out and infusing without issues upon discharge. VSS Pt informed of chemo appt at [facility] at 1030 tomorrow and voiced understanding Pt discharged self ambulatory in satisfactory condition

## 2020-11-08 ENCOUNTER — Other Ambulatory Visit: Payer: Self-pay

## 2020-11-08 ENCOUNTER — Inpatient Hospital Stay: Payer: 59 | Attending: Hematology

## 2020-11-08 VITALS — BP 158/95 | HR 78 | Temp 98.1°F | Resp 17

## 2020-11-08 DIAGNOSIS — Z79899 Other long term (current) drug therapy: Secondary | ICD-10-CM | POA: Insufficient documentation

## 2020-11-08 DIAGNOSIS — C8513 Unspecified B-cell lymphoma, intra-abdominal lymph nodes: Secondary | ICD-10-CM | POA: Diagnosis present

## 2020-11-08 DIAGNOSIS — I110 Hypertensive heart disease with heart failure: Secondary | ICD-10-CM | POA: Diagnosis not present

## 2020-11-08 DIAGNOSIS — I509 Heart failure, unspecified: Secondary | ICD-10-CM | POA: Insufficient documentation

## 2020-11-08 DIAGNOSIS — Z87891 Personal history of nicotine dependence: Secondary | ICD-10-CM | POA: Diagnosis not present

## 2020-11-08 DIAGNOSIS — E876 Hypokalemia: Secondary | ICD-10-CM | POA: Diagnosis not present

## 2020-11-08 DIAGNOSIS — Z5111 Encounter for antineoplastic chemotherapy: Secondary | ICD-10-CM | POA: Diagnosis present

## 2020-11-08 DIAGNOSIS — C8510 Unspecified B-cell lymphoma, unspecified site: Secondary | ICD-10-CM

## 2020-11-08 MED ORDER — SODIUM CHLORIDE 0.9 % IV SOLN
Freq: Once | INTRAVENOUS | Status: AC
  Administered 2020-11-08: 16 mg via INTRAVENOUS
  Filled 2020-11-08: qty 8

## 2020-11-08 MED ORDER — SODIUM CHLORIDE 0.9 % IV SOLN
INTRAVENOUS | Status: DC
  Filled 2020-11-08: qty 250

## 2020-11-08 MED ORDER — PREDNISONE 50 MG PO TABS
100.0000 mg | ORAL_TABLET | Freq: Once | ORAL | Status: AC
  Administered 2020-11-08: 100 mg via ORAL

## 2020-11-08 MED ORDER — SODIUM CHLORIDE 0.9% FLUSH
10.0000 mL | INTRAVENOUS | Status: DC | PRN
  Administered 2020-11-08: 10 mL via INTRAVENOUS
  Filled 2020-11-08: qty 10

## 2020-11-08 MED ORDER — SODIUM CHLORIDE 0.9 % IV SOLN
750.0000 mg/m2 | Freq: Once | INTRAVENOUS | Status: AC
  Administered 2020-11-08: 1420 mg via INTRAVENOUS
  Filled 2020-11-08: qty 71

## 2020-11-08 MED ORDER — HEPARIN SOD (PORK) LOCK FLUSH 100 UNIT/ML IV SOLN
500.0000 [IU] | Freq: Once | INTRAVENOUS | Status: AC
  Administered 2020-11-08: 500 [IU] via INTRAVENOUS
  Filled 2020-11-08: qty 5

## 2020-11-08 NOTE — Patient Instructions (Signed)
[facility] Discharge Instructions for Patients Receiving Chemotherapy  Today you received the following chemotherapy agents cytoxan  To help prevent nausea and vomiting after your treatment, we encourage you to take your nausea medication as directed   If you develop nausea and vomiting that is not controlled by your nausea medication, call the clinic.   BELOW ARE SYMPTOMS THAT SHOULD BE REPORTED IMMEDIATELY:  *FEVER GREATER THAN 100.5 F  *CHILLS WITH OR WITHOUT FEVER  NAUSEA AND VOMITING THAT IS NOT CONTROLLED WITH YOUR NAUSEA MEDICATION  *UNUSUAL SHORTNESS OF BREATH  *UNUSUAL BRUISING OR BLEEDING  TENDERNESS IN MOUTH AND THROAT WITH OR WITHOUT PRESENCE OF ULCERS  *URINARY PROBLEMS  *BOWEL PROBLEMS  UNUSUAL RASH Items with * indicate a potential emergency and should be followed up as soon as possible.  Feel free to call the clinic you have any questions or concerns. The clinic phone number is [phone].  

## 2020-11-11 ENCOUNTER — Other Ambulatory Visit: Payer: Self-pay

## 2020-11-11 ENCOUNTER — Other Ambulatory Visit (HOSPITAL_COMMUNITY): Payer: Self-pay | Admitting: *Deleted

## 2020-11-11 ENCOUNTER — Encounter (HOSPITAL_COMMUNITY): Payer: Self-pay

## 2020-11-11 ENCOUNTER — Ambulatory Visit (HOSPITAL_COMMUNITY): Payer: 59

## 2020-11-11 ENCOUNTER — Inpatient Hospital Stay (HOSPITAL_COMMUNITY): Payer: 59 | Attending: Hematology

## 2020-11-11 ENCOUNTER — Other Ambulatory Visit (HOSPITAL_COMMUNITY): Payer: Self-pay | Admitting: Hematology and Oncology

## 2020-11-11 VITALS — BP 136/84 | HR 93 | Temp 97.3°F | Resp 17 | Wt 170.6 lb

## 2020-11-11 DIAGNOSIS — I509 Heart failure, unspecified: Secondary | ICD-10-CM | POA: Diagnosis not present

## 2020-11-11 DIAGNOSIS — E876 Hypokalemia: Secondary | ICD-10-CM | POA: Diagnosis not present

## 2020-11-11 DIAGNOSIS — Z800 Family history of malignant neoplasm of digestive organs: Secondary | ICD-10-CM | POA: Insufficient documentation

## 2020-11-11 DIAGNOSIS — Z5111 Encounter for antineoplastic chemotherapy: Secondary | ICD-10-CM | POA: Diagnosis not present

## 2020-11-11 DIAGNOSIS — R5383 Other fatigue: Secondary | ICD-10-CM | POA: Insufficient documentation

## 2020-11-11 DIAGNOSIS — R110 Nausea: Secondary | ICD-10-CM | POA: Diagnosis not present

## 2020-11-11 DIAGNOSIS — R200 Anesthesia of skin: Secondary | ICD-10-CM | POA: Diagnosis not present

## 2020-11-11 DIAGNOSIS — I110 Hypertensive heart disease with heart failure: Secondary | ICD-10-CM | POA: Diagnosis not present

## 2020-11-11 DIAGNOSIS — G629 Polyneuropathy, unspecified: Secondary | ICD-10-CM | POA: Diagnosis not present

## 2020-11-11 DIAGNOSIS — Z87891 Personal history of nicotine dependence: Secondary | ICD-10-CM | POA: Insufficient documentation

## 2020-11-11 DIAGNOSIS — R197 Diarrhea, unspecified: Secondary | ICD-10-CM | POA: Diagnosis not present

## 2020-11-11 DIAGNOSIS — Z79899 Other long term (current) drug therapy: Secondary | ICD-10-CM | POA: Insufficient documentation

## 2020-11-11 DIAGNOSIS — C8510 Unspecified B-cell lymphoma, unspecified site: Secondary | ICD-10-CM

## 2020-11-11 DIAGNOSIS — R109 Unspecified abdominal pain: Secondary | ICD-10-CM | POA: Diagnosis not present

## 2020-11-11 DIAGNOSIS — Z5112 Encounter for antineoplastic immunotherapy: Secondary | ICD-10-CM | POA: Diagnosis present

## 2020-11-11 DIAGNOSIS — Z5189 Encounter for other specified aftercare: Secondary | ICD-10-CM | POA: Diagnosis not present

## 2020-11-11 MED ORDER — PEGFILGRASTIM-CBQV 6 MG/0.6ML ~~LOC~~ SOSY
6.0000 mg | PREFILLED_SYRINGE | Freq: Once | SUBCUTANEOUS | Status: AC
  Administered 2020-11-11: 6 mg via SUBCUTANEOUS

## 2020-11-11 MED ORDER — OXYCODONE HCL 5 MG PO TABS: 10.0000 mg | ORAL_TABLET | Freq: Two times a day (BID) | ORAL | 0 refills | Status: DC | PRN

## 2020-11-11 MED ORDER — PEGFILGRASTIM-CBQV 6 MG/0.6ML ~~LOC~~ SOSY
PREFILLED_SYRINGE | SUBCUTANEOUS | Status: AC
  Filled 2020-11-11: qty 0.6

## 2020-11-11 NOTE — Progress Notes (Signed)
Richard Cox presents today for injection per the provider's orders. Udenyca administration without incident; injection site WNL; see MAR for injection details.  Patient tolerated procedure well and without incident.  No questions or complaints noted at this time.  Discharged ambulatory in stable condition.  

## 2020-11-12 ENCOUNTER — Ambulatory Visit (HOSPITAL_COMMUNITY): Payer: 59

## 2020-11-18 ENCOUNTER — Other Ambulatory Visit: Payer: Self-pay

## 2020-11-18 ENCOUNTER — Ambulatory Visit (HOSPITAL_COMMUNITY)
Admission: RE | Admit: 2020-11-18 | Discharge: 2020-11-18 | Disposition: A | Discharge status: Home / Self Care | Payer: 59 | Source: Ambulatory Visit | Attending: Hematology | Admitting: Hematology

## 2020-11-18 DIAGNOSIS — C8510 Unspecified B-cell lymphoma, unspecified site: Secondary | ICD-10-CM | POA: Diagnosis not present

## 2020-11-18 DIAGNOSIS — Z0189 Encounter for other specified special examinations: Secondary | ICD-10-CM | POA: Diagnosis not present

## 2020-11-18 LAB — ECHOCARDIOGRAM COMPLETE
AR max vel: 4.25 cm2
AV Area VTI: 4.99 cm2
AV Area mean vel: 5.16 cm2
AV Mean grad: 4.7 mmHg
AV Peak grad: 10.7 mmHg
Ao pk vel: 1.64 m/s
Area-P 1/2: 3.4 cm2
S' Lateral: 3 cm

## 2020-11-18 MED FILL — SULFAMETHOXAZOLE-TMP DS TAB: 800-160 | 30 days supply | Qty: 30 | Fill #2

## 2020-11-18 NOTE — Progress Notes (Signed)
*  PRELIMINARY RESULTS* Echocardiogram 2D Echocardiogram has been performed.  Richard Cox 11/18/2020, 1:58 PM

## 2020-11-25 ENCOUNTER — Other Ambulatory Visit (HOSPITAL_COMMUNITY): Payer: 59

## 2020-11-25 ENCOUNTER — Ambulatory Visit (HOSPITAL_COMMUNITY): Payer: 59 | Admitting: Hematology

## 2020-11-25 ENCOUNTER — Telehealth: Payer: 59 | Admitting: Internal Medicine

## 2020-11-25 ENCOUNTER — Other Ambulatory Visit: Payer: Self-pay

## 2020-11-25 ENCOUNTER — Ambulatory Visit (HOSPITAL_COMMUNITY): Payer: 59

## 2020-11-26 ENCOUNTER — Other Ambulatory Visit (HOSPITAL_COMMUNITY): Payer: 59

## 2020-11-26 ENCOUNTER — Ambulatory Visit (HOSPITAL_COMMUNITY): Payer: 59

## 2020-11-26 ENCOUNTER — Ambulatory Visit (HOSPITAL_COMMUNITY): Payer: 59 | Admitting: Hematology

## 2020-11-27 ENCOUNTER — Telehealth (HOSPITAL_COMMUNITY): Payer: Self-pay

## 2020-11-27 ENCOUNTER — Ambulatory Visit (HOSPITAL_COMMUNITY): Payer: 59

## 2020-11-27 NOTE — Telephone Encounter (Signed)
Patient called concerning the status of FMLA paperwork.  Forms have not yet been received from employer.  This nurse returned call and there was no answer on patients mobile number.  This nurse left a message to have employer to resend forms via fax.

## 2020-11-28 ENCOUNTER — Ambulatory Visit (HOSPITAL_COMMUNITY): Payer: 59

## 2020-11-28 ENCOUNTER — Other Ambulatory Visit: Payer: Self-pay | Admitting: Pharmacist

## 2020-11-28 DIAGNOSIS — B20 Human immunodeficiency virus [HIV] disease: Secondary | ICD-10-CM

## 2020-11-28 MED ORDER — BIKTARVY 50-200-25 MG PO TABS
1.0000 | ORAL_TABLET | Freq: Every day | ORAL | 5 refills | Status: DC
Start: 2020-11-28 — End: 2021-05-13

## 2020-11-28 NOTE — Progress Notes (Signed)
Patient's insurance requires Biktarvy to be filled at [facility]. Resending Rx now. Butch Penny will call patient and coordinate.

## 2020-11-29 ENCOUNTER — Ambulatory Visit (HOSPITAL_COMMUNITY): Payer: 59

## 2020-11-29 ENCOUNTER — Ambulatory Visit: Payer: 59 | Admitting: Internal Medicine

## 2020-12-02 ENCOUNTER — Other Ambulatory Visit: Payer: Self-pay

## 2020-12-02 ENCOUNTER — Ambulatory Visit (HOSPITAL_COMMUNITY): Payer: 59

## 2020-12-02 ENCOUNTER — Other Ambulatory Visit (HOSPITAL_COMMUNITY): Payer: Self-pay

## 2020-12-02 ENCOUNTER — Inpatient Hospital Stay (HOSPITAL_BASED_OUTPATIENT_CLINIC_OR_DEPARTMENT_OTHER): Payer: 59 | Admitting: Hematology

## 2020-12-02 ENCOUNTER — Inpatient Hospital Stay (HOSPITAL_COMMUNITY): Payer: 59

## 2020-12-02 VITALS — BP 132/77 | HR 75 | Temp 97.2°F | Resp 18

## 2020-12-02 VITALS — BP 156/78 | HR 68 | Temp 97.3°F | Resp 18 | Wt 170.2 lb

## 2020-12-02 DIAGNOSIS — C8510 Unspecified B-cell lymphoma, unspecified site: Secondary | ICD-10-CM

## 2020-12-02 DIAGNOSIS — Z5111 Encounter for antineoplastic chemotherapy: Secondary | ICD-10-CM | POA: Diagnosis not present

## 2020-12-02 LAB — CBC WITH DIFFERENTIAL/PLATELET
Abs Immature Granulocytes: 0.02 10*3/uL (ref 0.00–0.07)
Basophils Absolute: 0 10*3/uL (ref 0.0–0.1)
Basophils Relative: 0 %
Eosinophils Absolute: 0 10*3/uL (ref 0.0–0.5)
Eosinophils Relative: 0 %
HCT: 32.6 % — ABNORMAL LOW (ref 39.0–52.0)
Hemoglobin: 10.6 g/dL — ABNORMAL LOW (ref 13.0–17.0)
Immature Granulocytes: 0 %
Lymphocytes Relative: 19 %
Lymphs Abs: 0.9 10*3/uL (ref 0.7–4.0)
MCH: 32.1 pg (ref 26.0–34.0)
MCHC: 32.5 g/dL (ref 30.0–36.0)
MCV: 98.8 fL (ref 80.0–100.0)
Monocytes Absolute: 0.6 10*3/uL (ref 0.1–1.0)
Monocytes Relative: 12 %
Neutro Abs: 3.1 10*3/uL (ref 1.7–7.7)
Neutrophils Relative %: 69 %
Platelets: 114 10*3/uL — ABNORMAL LOW (ref 150–400)
RBC: 3.3 MIL/uL — ABNORMAL LOW (ref 4.22–5.81)
RDW: 17.9 % — ABNORMAL HIGH (ref 11.5–15.5)
WBC: 4.6 10*3/uL (ref 4.0–10.5)
nRBC: 0 % (ref 0.0–0.2)

## 2020-12-02 LAB — URIC ACID: Uric Acid, Serum: 7.8 mg/dL (ref 3.7–8.6)

## 2020-12-02 LAB — COMPREHENSIVE METABOLIC PANEL
ALT: 17 U/L (ref 0–44)
AST: 19 U/L (ref 15–41)
Albumin: 3.9 g/dL (ref 3.5–5.0)
Alkaline Phosphatase: 54 U/L (ref 38–126)
Anion gap: 12 (ref 5–15)
BUN: 22 mg/dL — ABNORMAL HIGH (ref 6–20)
CO2: 22 mmol/L (ref 22–32)
Calcium: 9.3 mg/dL (ref 8.9–10.3)
Chloride: 103 mmol/L (ref 98–111)
Creatinine, Ser: 1.17 mg/dL (ref 0.61–1.24)
GFR, Estimated: >60 mL/min (ref >60)
Glucose, Bld: 93 mg/dL (ref 70–99)
Potassium: 3.7 mmol/L (ref 3.5–5.1)
Sodium: 137 mmol/L (ref 135–145)
Total Bilirubin: 0.6 mg/dL (ref 0.3–1.2)
Total Protein: 6.9 g/dL (ref 6.5–8.1)

## 2020-12-02 LAB — LACTATE DEHYDROGENASE: LDH: 144 U/L (ref 98–192)

## 2020-12-02 LAB — PHOSPHORUS: Phosphorus: 3.7 mg/dL (ref 2.5–4.6)

## 2020-12-02 MED ORDER — SODIUM CHLORIDE 0.9% FLUSH
10.0000 mL | INTRAVENOUS | Status: DC | PRN
  Administered 2020-12-02: 10 mL

## 2020-12-02 MED ORDER — SODIUM CHLORIDE 0.9 % IV SOLN: INTRAVENOUS | Status: DC

## 2020-12-02 MED ORDER — SODIUM CHLORIDE 0.9 % IV SOLN
Freq: Once | INTRAVENOUS | Status: AC
  Administered 2020-12-02: 8 mg via INTRAVENOUS
  Filled 2020-12-02: qty 4

## 2020-12-02 MED ORDER — PROCHLORPERAZINE MALEATE 10 MG PO TABS: 10.0000 mg | ORAL_TABLET | Freq: Four times a day (QID) | ORAL | 2 refills | Status: DC | PRN

## 2020-12-02 MED ORDER — FAMOTIDINE IN NACL 20-0.9 MG/50ML-% IV SOLN
20.0000 mg | Freq: Once | INTRAVENOUS | Status: AC
  Administered 2020-12-02: 20 mg via INTRAVENOUS
  Filled 2020-12-02: qty 50

## 2020-12-02 MED ORDER — SODIUM CHLORIDE 0.9 % IV SOLN
375.0000 mg/m2 | Freq: Once | INTRAVENOUS | Status: AC
  Administered 2020-12-02: 700 mg via INTRAVENOUS
  Filled 2020-12-02: qty 50

## 2020-12-02 MED ORDER — ACETAMINOPHEN 325 MG PO TABS
650.0000 mg | ORAL_TABLET | Freq: Once | ORAL | Status: AC
  Administered 2020-12-02: 650 mg via ORAL
  Filled 2020-12-02: qty 2

## 2020-12-02 MED ORDER — DIPHENHYDRAMINE HCL 50 MG/ML IJ SOLN
50.0000 mg | Freq: Once | INTRAMUSCULAR | Status: AC
  Administered 2020-12-02: 50 mg via INTRAVENOUS
  Filled 2020-12-02: qty 1

## 2020-12-02 MED ORDER — VINCRISTINE SULFATE CHEMO INJECTION 1 MG/ML
Freq: Once | INTRAVENOUS | Status: AC
  Filled 2020-12-02: qty 10

## 2020-12-02 NOTE — Progress Notes (Signed)
[place] [place], [place] [address]   CLINIC:  Medical Oncology/Hematology  PCP:  Richard Spar, MD [address] / [place] [address] [phone]   REASON FOR VISIT:  Follow-up for high-grade B-cell lymphoma  PRIOR THERAPY: None  NGS Results: [employer] BCL6 and MYC rearrangement detected  CURRENT THERAPY: R-EPOCH every 3 weeks  BRIEF ONCOLOGIC HISTORY:  Oncology History  High grade B-cell lymphoma (Richard Cox)  08/21/2020 Initial Diagnosis   High grade B-cell lymphoma (Richard Cox)   09/02/2020 -  Chemotherapy    Patient is on Treatment Plan: IP NON-HODGKINS LYMPHOMA R-EPOCH Q21D      09/02/2020 Genetic Testing         CANCER STAGING: Cancer Staging No matching staging information was found for the patient.  INTERVAL HISTORY:  [name], a 40 y.o. male, returns for routine follow-up and consideration for next cycle of chemotherapy. Richard Cox was last seen on 11/04/2020.  Due for cycle #5 of R-EPOCH today.   Overall, he tells me he has been feeling pretty well. He reports that he started feeling numbness in his fingers 1 week ago and that it is constant and more noticeable when he tries to pick things up, but not painful; he is able to tie shoes and use his smartphone. He tolerated the previous treatment well. He denies having N/V or leg swelling, though he reports having occasional diarrhea and abdominal discomfort. He takes his pain meds only as needed.  Overall, he feels ready for next cycle of chemo today.    REVIEW OF SYSTEMS:  Review of Systems  Constitutional: Positive for fatigue (90%). Negative for appetite change.  Gastrointestinal: Positive for abdominal pain (occasional discomfort) and diarrhea (occasional). Negative for nausea and vomiting.  Musculoskeletal: Positive for myalgias (4/10 generalized intermittent body pain).  Neurological: Positive for numbness (fingers; constant).  All other systems reviewed and are  negative.   PAST MEDICAL/SURGICAL HISTORY:  Past Medical History:  Diagnosis Date  . CHF (congestive heart failure) (Richard Cox)   . Essential hypertension   . HIV infection (Richard Cox)   . Noncompliance with medication regimen   . Secondary cardiomyopathy Richard Cox)    Past Surgical History:  Procedure Laterality Date  . IR IMAGING GUIDED PORT INSERTION  08/23/2020   Right  . NO PAST SURGERIES      SOCIAL HISTORY:  Social History   Socioeconomic History  . Marital status: Single    Spouse name: Not on file  . Number of children: 0  . Years of education: 27  . Highest education level: Not on file  Occupational History  . Not on file  Tobacco Use  . Smoking status: Light Tobacco Smoker    Packs/day: 0.25    Types: Cigarettes  . Smokeless tobacco: Never Used  Vaping Use  . Vaping Use: Never used  Substance and Sexual Activity  . Alcohol use: Not Currently  . Drug use: Not Currently    Types: Marijuana    Comment: x 1 week  . Sexual activity: Not Currently    Comment: pt given condoms  Other Topics Concern  . Not on file  Social History Narrative  . Not on file   Social Determinants of Health   Financial Resource Strain: Low Risk   . Difficulty of Paying Living Expenses: Not very hard  Food Insecurity: No Food Insecurity  . Worried About Charity fundraiser in the Last Year: Never true  . Ran Out of Food in  the Last Year: Never true  Transportation Needs: No Transportation Needs  . Lack of Transportation (Medical): No  . Lack of Transportation (Non-Medical): No  Physical Activity: Insufficiently Active  . Days of Exercise per Week: 5 days  . Minutes of Exercise per Session: 20 min  Stress: No Stress Concern Present  . Feeling of Stress : Only a little  Social Connections: Socially Isolated  . Frequency of Communication with Friends and Family: More than three times a week  . Frequency of Social Gatherings with Friends and Family: Never  . Attends Religious Services:  Never  . Active Member of Clubs or Organizations: No  . Attends Archivist Meetings: Not on file  . Marital Status: Never married  Intimate Partner Violence: Not At Risk  . Fear of Current or Ex-Partner: No  . Emotionally Abused: No  . Physically Abused: No  . Sexually Abused: No    FAMILY HISTORY:  Family History  Problem Relation Age of Onset  . Diabetes Mellitus II Mother   . Cancer Mother        multiple myeloma  . Cancer Father        multiple myeloma  . Congestive Heart Failure Brother   . Heart Problems Brother   . Lupus Brother     CURRENT MEDICATIONS:  Current Outpatient Medications  Medication Sig Dispense Refill  . acetaminophen (TYLENOL) 500 MG tablet Take 1,000 mg by mouth every 6 (six) hours as needed for moderate pain or headache.    . allopurinol (ZYLOPRIM) 300 MG tablet Take 1 tablet (300 mg total) by mouth daily. 30 tablet 3  . bictegravir-emtricitabine-tenofovir AF (BIKTARVY) 50-200-25 MG TABS tablet Take 1 tablet by mouth daily. 30 tablet 5  . carvedilol (COREG) 6.25 MG tablet Take 1 tablet (6.25 mg total) by mouth 2 (two) times daily with a meal. For BP and Heart (Patient taking differently: Take 6.25 mg by mouth 2 (two) times daily with a meal.) 60 tablet 11  . dicyclomine (BENTYL) 10 MG capsule     . docusate sodium (COLACE) 100 MG capsule Take 100 mg by mouth daily as needed for mild constipation.    . hydrALAZINE (APRESOLINE) 50 MG tablet Take 50 mg by mouth 3 (three) times daily.    . isosorbide mononitrate (IMDUR) 30 MG 24 hr tablet Take 30 mg by mouth daily.    Marland Kitchen lidocaine-prilocaine (EMLA) cream Apply 1 application topically as needed. 30 g 0  . losartan (COZAAR) 50 MG tablet Take 1 tablet (50 mg total) by mouth daily. 90 tablet 3  . ondansetron (ZOFRAN-ODT) 4 MG disintegrating tablet     . oxyCODONE (OXY IR/ROXICODONE) 5 MG immediate release tablet Take 2 tablets (10 mg total) by mouth every 12 (twelve) hours as needed for severe pain. 120  tablet 0  . potassium chloride (KLOR-CON) 20 MEQ tablet Take 0.5 tablets (10 mEq total) by mouth daily. 30 tablet 1  . prochlorperazine (COMPAZINE) 10 MG tablet Take 1 tablet (10 mg total) by mouth every 6 (six) hours as needed for nausea or vomiting. 30 tablet 2  . sulfamethoxazole-trimethoprim (BACTRIM DS) 800-160 MG tablet Take 1 tablet by mouth daily. 30 tablet 5  . furosemide (LASIX) 40 MG tablet Take 1 tablet (40 mg total) by mouth daily. 30 tablet 3   No current facility-administered medications for this visit.   Facility-Administered Medications Ordered in Other Visits  Medication Dose Route Frequency Provider Last Rate Last Admin  . 0.9 %  sodium  chloride infusion   Intravenous Continuous Derek Jack, MD      . 0.9 %  sodium chloride infusion   Intravenous Continuous Derek Jack, MD   Stopped at 09/26/20 1124  . 0.9 %  sodium chloride infusion   Intravenous Continuous Derek Jack, MD 20 mL/hr at 11/04/20 0934 New Bag at 11/04/20 0934  . 0.9 %  sodium chloride infusion   Intravenous Continuous Derek Jack, MD 500 mL/hr at 11/04/20 0935 Bolus from Bag at 11/04/20 0935  . 0.9 %  sodium chloride infusion   Intravenous Continuous Derek Jack, MD   Stopped at 11/07/20 1110  . sodium chloride flush (NS) 0.9 % injection 10 mL  10 mL Intravenous PRN Derek Jack, MD   10 mL at 10/15/20 1035  . sodium chloride flush (NS) 0.9 % injection 10 mL  10 mL Intracatheter PRN Derek Jack, MD   10 mL at 11/04/20 0800  . sodium chloride flush (NS) 0.9 % injection 10 mL  10 mL Intravenous PRN Derek Jack, MD   10 mL at 11/07/20 1046    ALLERGIES:  Allergies  Allergen Reactions  . Neosporin [Neomycin-Bacitracin Zn-Polymyx] Rash    PHYSICAL EXAM:  Performance status (ECOG): 0 - Asymptomatic  Vitals:   12/02/20 0745  BP: (!) 156/78  Pulse: 68  Resp: 18  Temp: (!) 97.3 F (36.3 C)  SpO2: 100%   Wt Readings from Last 3  Encounters:  12/02/20 170 lb 3.2 oz (77.2 kg)  11/11/20 170 lb 9.6 oz (77.4 kg)  11/04/20 162 lb 6.4 oz (73.7 kg)   Physical Exam Vitals reviewed.  Constitutional:      Appearance: Normal appearance.  Cardiovascular:     Rate and Rhythm: Normal rate and regular rhythm.     Pulses: Normal pulses.     Heart sounds: Normal heart sounds.  Pulmonary:     Effort: Pulmonary effort is normal.     Breath sounds: Normal breath sounds.  Chest:     Comments: Port-a-Cath in R chest Abdominal:     Palpations: Abdomen is soft. There is no mass.     Tenderness: There is no abdominal tenderness.  Neurological:     General: No focal deficit present.     Mental Status: He is alert and oriented to person, place, and time.  Psychiatric:        Mood and Affect: Mood normal.        Behavior: Behavior normal.     LABORATORY DATA:  I have reviewed the labs as listed.  CBC Latest Ref Rng & Units 12/02/2020 11/04/2020 10/14/2020  WBC 4.0 - 10.5 K/uL 4.6 8.7 6.9  Hemoglobin 13.0 - 17.0 g/dL 10.6(L) 10.7(L) 10.3(L)  Hematocrit 39.0 - 52.0 % 32.6(L) 33.6(L) 31.9(L)  Platelets 150 - 400 K/uL 114(L) 128(L) 120(L)   CMP Latest Ref Rng & Units 12/02/2020 11/04/2020 10/14/2020  Glucose 70 - 99 mg/dL 93 137(H) 147(H)  BUN 6 - 20 mg/dL 22(H) 20 18  Creatinine 0.61 - 1.24 mg/dL 1.17 1.60(H) 1.20  Sodium 135 - 145 mmol/L 137 138 136  Potassium 3.5 - 5.1 mmol/L 3.7 3.6 3.6  Chloride 98 - 111 mmol/L 103 104 105  CO2 22 - 32 mmol/L '22 23 22  ' Calcium 8.9 - 10.3 mg/dL 9.3 9.3 8.8(L)  Total Protein 6.5 - 8.1 g/dL 6.9 7.2 6.8  Total Bilirubin 0.3 - 1.2 mg/dL 0.6 0.5 0.3  Alkaline Phos 38 - 126 U/L 54 72 75  AST 15 - 41 U/L  '19 24 18  ' ALT 0 - 44 U/L '17 18 15   ' Lab Results  Component Value Date   LDH 144 12/02/2020   LDH 179 11/04/2020   LDH 155 10/14/2020    DIAGNOSTIC IMAGING:  I have independently reviewed the scans and discussed with the patient. ECHOCARDIOGRAM COMPLETE  Result Date: 11/18/2020     ECHOCARDIOGRAM REPORT   Patient Name:   Richard Cox Date of Exam: 11/18/2020 Medical Rec #:  [id number]  Height:       73.0 in Accession #:    [id number] Weight:       170.6 lb Date of Birth:  [birth year]   BSA:          2.011 m Patient Age:    63 years   BP:           145/79 mmHg Patient Gender: M          HR:           80 bpm. Exam Location:  [facility] Procedure: 2D Echo, Cardiac Doppler and Color Doppler Indications:    Chemo Z09  History:        Patient has prior history of Echocardiogram examinations, most                 recent 09/16/2020. CHF; Risk Factors:Hypertension. AIDS(acquired                 immune deficiency syndrome), Alcohol abuse, Polysubstance abuse                 -cocaine use in remission, Secondary cardiomyopathy (From Hx),                 High grade B-cell lymphoma.  Sonographer:    Alvino Chapel RCS Referring Phys: [phone] [place]  1. Severe asymmetric septal hypertrophy without obstructive gradient. . Left ventricular ejection fraction, by estimation, is 55 to 60%. The left ventricle has normal function. The left ventricle has no regional wall motion abnormalities. There is severe asymmetric left ventricular hypertrophy of the septal segment. Left ventricular diastolic parameters are indeterminate.  2. Right ventricular systolic function is normal. The right ventricular size is normal. There is normal pulmonary artery systolic pressure.  3. Left atrial size was severely dilated.  4. The mitral valve is normal in structure. Trivial mitral valve regurgitation. No evidence of mitral stenosis.  5. The aortic valve is tricuspid. Aortic valve regurgitation is not visualized. No aortic stenosis is present.  6. The inferior vena cava is normal in size with greater than 50% respiratory variability, suggesting right atrial pressure of 3 mmHg. FINDINGS  Left Ventricle: Severe asymmetric septal hypertrophy without obstructive gradient. Left ventricular ejection fraction, by  estimation, is 55 to 60%. The left ventricle has normal function. The left ventricle has no regional wall motion abnormalities. The  left ventricular internal cavity size was normal in size. There is severe asymmetric left ventricular hypertrophy of the septal segment. Left ventricular diastolic parameters are indeterminate. Right Ventricle: The right ventricular size is normal. No increase in right ventricular wall thickness. Right ventricular systolic function is normal. There is normal pulmonary artery systolic pressure. The tricuspid regurgitant velocity is 2.49 m/s, and  with an assumed right atrial pressure of 3 mmHg, the estimated right ventricular systolic pressure is 49.8 mmHg. Left Atrium: Left atrial size was severely dilated. Right Atrium: Right atrial size was normal in size. Pericardium: There is no evidence of pericardial effusion. Mitral Valve: The mitral  valve is normal in structure. Trivial mitral valve regurgitation. No evidence of mitral valve stenosis. Tricuspid Valve: The tricuspid valve is normal in structure. Tricuspid valve regurgitation is mild . No evidence of tricuspid stenosis. Aortic Valve: The aortic valve is tricuspid. Aortic valve regurgitation is not visualized. No aortic stenosis is present. Aortic valve mean gradient measures 4.7 mmHg. Aortic valve peak gradient measures 10.7 mmHg. Aortic valve area, by VTI measures 4.99  cm. Pulmonic Valve: The pulmonic valve was not well visualized. Pulmonic valve regurgitation is not visualized. No evidence of pulmonic stenosis. Aorta: The aortic root is normal in size and structure. Pulmonary Artery: 25. Venous: The inferior vena cava is normal in size with greater than 50% respiratory variability, suggesting right atrial pressure of 3 mmHg. IAS/Shunts: No atrial level shunt detected by color flow Doppler.  LEFT VENTRICLE PLAX 2D LVIDd:         4.00 cm  Diastology LVIDs:         3.00 cm  LV e' medial:    6.20 cm/s LV PW:         1.50 cm  LV  E/e' medial:  11.9 LV IVS:        3.50 cm  LV e' lateral:   8.59 cm/s LVOT diam:     2.60 cm  LV E/e' lateral: 8.6 LV SV:         126 LV SV Index:   63 LVOT Area:     5.31 cm  RIGHT VENTRICLE RV S prime:     11.50 cm/s TAPSE (M-mode): 2.2 cm LEFT ATRIUM              Index       RIGHT ATRIUM           Index LA diam:        5.10 cm  2.54 cm/m  RA Area:     16.30 cm LA Vol (A2C):   98.2 ml  48.83 ml/m RA Volume:   44.40 ml  22.08 ml/m LA Vol (A4C):   102.5 ml 50.97 ml/m LA Biplane Vol: 101.0 ml 50.22 ml/m  AORTIC VALVE AV Area (Vmax):    4.25 cm AV Area (Vmean):   5.16 cm AV Area (VTI):     4.99 cm AV Vmax:           163.50 cm/s AV Vmean:          96.821 cm/s AV VTI:            0.252 m AV Peak Grad:      10.7 mmHg AV Mean Grad:      4.7 mmHg LVOT Vmax:         131.00 cm/s LVOT Vmean:        94.100 cm/s LVOT VTI:          0.237 m LVOT/AV VTI ratio: 0.94  AORTA Ao Root diam: 3.50 cm MITRAL VALVE               TRICUSPID VALVE MV Area (PHT): 3.40 cm    TR Peak grad:   24.8 mmHg MV Decel Time: 223 msec    TR Vmax:        249.00 cm/s MV E velocity: 73.70 cm/s MV A velocity: 59.30 cm/s  SHUNTS MV E/A ratio:  1.24        Systemic VTI:  0.24 m  Systemic Diam: 2.60 cm Carlyle Dolly MD Electronically signed by Carlyle Dolly MD Signature Date/Time: 11/18/2020/4:18:17 PM    Final      ASSESSMENT:  1.B-cell high-grade lymphoma: -Presentation to the ER with left upper quadrant and mid abdominal pain for 2 weeks. -10 pound weight loss in the last 2 weeks due to decreased appetite. Denies any fevers or night sweats. -CT AP with contrast on 07/24/2020 shows 13.8 11.2 cm mass in the epigastric region, lobulated large mass in the mesenteric region, 9.3 x 5.1 cm mass in the inferior pelvis, 9.7 x 4.1 cm mass in the superior pelvis. Epigastric mass seems to abut posterior spleen. -LDH was elevated at 309, uric acid 10.2. -PET scan on 07/31/2020 showed extensive multifocal soft tissue masses  within the abdomen and pelvis, most of them between SUV 15 and 20. FDG avid subpleural nodules overlying the right lung. Splenomegaly. Multiple scattered lung nodules, nonspecific. -Biopsy of the soft tissue mass in the upper abdomen on 08/15/2020 consistent with high-grade B-cell lymphoma. IHC positive for CD20, CD10, BCL6, negative for BCL-2, CD3, CD5, CD30, mum 1 and EBV. Ki-67 is 80-90%. 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 -He was evaluated by Dr. Eldridge Abrahams [facility] and was not felt to be a candidate for clinical trial. -Cycle 1 of R-EPOCH on 09/05/2020. -PET scan on 10/28/2020 showed marked interval response with most of the sites of hypermetabolic disease with no residual hypermetabolism.  One of the more dominant foci of disease in the left upper quadrant of the abdomen does show low-level of hypermetabolism compatible with [place]au[place] 4.  No new hypermetabolic disease.  Persistent splenomegaly without hypermetabolism in the parenchyma.  2. Social/family history: -He quit smoking last year. -Family history significant for mother and father who had multiple myeloma. Grandfather had colon cancer.  3. 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  4. CHF: -Echocardiogram on 09/16/2020 with EF 50%. -Echocardiogram on 08/22/2020 with EF 45-50%. -Echocardiogram on 09/18/2019 shows EF 40-45%. -He is on Coreg 6.25 mg twice daily, Imdur 30 mg daily. Lasix 40 mg is an expired medication list.   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 -He has tolerated first cycle except neuropathy which is new in the fingertips. -Reviewed his labs. LDH was normal. LFTs are also normal. White count is 4.6 with platelet count of 114. -He will proceed with cycle 5 today. Plan to cut back on vincristine by 20%. -RTC 3 weeks for follow-up. Plan to repeat scans  after cycle 6.  2. Abdominal pain: -This has improved significantly since chemo started. He rarely requires oxycodone.  3. Nausea: -Continue Zofran ODT every 8 hours as needed.  4. Neuropathy: -He developed constant numbness of the fingertips. No numbness in the toes. -We will cut back on vincristine dose by 20%.  5. Hypokalemia: -Continue potassium 20 mEq daily.   Orders placed this encounter:  No orders of the defined types were placed in this encounter.    Derek Jack, MD [facility] [phone]   I, Milinda Antis, am acting as a scribe for Dr. Sanda Linger.  I, Derek Jack MD, have reviewed the above documentation for accuracy and completeness, and I agree with the above.

## 2020-12-02 NOTE — Progress Notes (Signed)
Patient assessed and labs reviewed by Dr. Katragadda. Okay to proceed with treatment. Primary RN and pharmacy aware. 

## 2020-12-02 NOTE — Patient Instructions (Signed)
[place] at [facility] Discharge Instructions  You were seen today by Dr. Delton Coombes. He went over your recent results. You received your treatment today; continue getting your daily treatment. Dr. Delton Coombes will see you back in 3 weeks for labs and follow up.   Thank you for choosing [place] at [facility] to provide your oncology and hematology care.  To afford each patient quality time with our provider, please arrive at least 15 minutes before your scheduled appointment time.   If you have a lab appointment with the [facility] please come in thru the Main Entrance and check in at the main information desk  You need to re-schedule your appointment should you arrive 10 or more minutes late.  We strive to give you quality time with our providers, and arriving late affects you and other patients whose appointments are after yours.  Also, if you no show three or more times for appointments you may be dismissed from the clinic at the providers discretion.     Again, thank you for choosing [facility].  Our hope is that these requests will decrease the amount of time that you wait before being seen by our physicians.       _____________________________________________________________  Should you have questions after your visit to [facility], please contact our office at [phone] between the hours of 8:00 a.m. and 4:30 p.m.  Voicemails left after 4:00 p.m. will not be returned until the following business day.  For prescription refill requests, have your pharmacy contact our office and allow 72 hours.    Cancer Center Support Programs:   > Cancer Support Group  2nd Tuesday of the month 1pm-2pm, Journey Room

## 2020-12-02 NOTE — Progress Notes (Signed)
Patient tolerated chemotherapy with no complaints voiced.  Side effects with management reviewed with understanding verbalized.  Port site clean and dry with no bruising or swelling noted at site.  Good blood return noted before and after administration of chemotherapy.  Chemotherapy pump connected with no alarms noted.   Patient left in satisfactory condition with VSS and no s/s of distress noted.  

## 2020-12-02 NOTE — Progress Notes (Signed)
Order received to reduce vincristine by 20% to 0.32 mg/m2 due to neuropathy.  Treatment plan updated to reflect above.  T.O. Dr Rhys Martini, PharmD

## 2020-12-03 ENCOUNTER — Inpatient Hospital Stay (HOSPITAL_COMMUNITY): Payer: 59

## 2020-12-03 ENCOUNTER — Telehealth: Payer: Self-pay

## 2020-12-03 ENCOUNTER — Encounter (HOSPITAL_COMMUNITY): Payer: Self-pay

## 2020-12-03 VITALS — BP 145/77 | HR 68 | Temp 97.8°F | Resp 18

## 2020-12-03 DIAGNOSIS — Z5111 Encounter for antineoplastic chemotherapy: Secondary | ICD-10-CM | POA: Diagnosis not present

## 2020-12-03 DIAGNOSIS — C8510 Unspecified B-cell lymphoma, unspecified site: Secondary | ICD-10-CM

## 2020-12-03 MED ORDER — PREDNISONE 50 MG PO TABS
100.0000 mg | ORAL_TABLET | Freq: Once | ORAL | Status: AC
  Administered 2020-12-03: 100 mg via ORAL
  Filled 2020-12-03: qty 2

## 2020-12-03 MED ORDER — SODIUM CHLORIDE 0.9 % IV SOLN: INTRAVENOUS | Status: DC

## 2020-12-03 MED ORDER — VINCRISTINE SULFATE CHEMO INJECTION 1 MG/ML
Freq: Once | INTRAVENOUS | Status: AC
  Filled 2020-12-03: qty 10

## 2020-12-03 MED ORDER — VINCRISTINE SULFATE CHEMO INJECTION 1 MG/ML: Freq: Once | INTRAVENOUS | Status: DC

## 2020-12-03 MED ORDER — SODIUM CHLORIDE 0.9 % IV SOLN
Freq: Once | INTRAVENOUS | Status: AC
  Administered 2020-12-03: 8 mg via INTRAVENOUS
  Filled 2020-12-03: qty 4

## 2020-12-03 NOTE — Telephone Encounter (Signed)
[facility] Patient Advocate Encounter   Was successful in obtaining a [place] copay card for [employer].  This copay card will make the patients copay 0.00.  I have spoken with the patient.    The billing information is as follows and has been shared with [facility].       Ileene Patrick, [facility] Patient [facility] for Infectious Disease Phone: [phone] Fax:  [phone]

## 2020-12-03 NOTE — Progress Notes (Unsigned)
  Patients port flushed with good blood return.  No complaints of pain.  Site clean and dry with no bruising or swelling noted.  No complaints voiced.  No s/s of distress noted.    Patient connected to chemotherapy pump with no alarms noted. See MAR for details. Port site clean and dry with no bruising or swelling noted at site. Good blood return noted. Tolerated procedure with no complaints voiced. Discharged in stable condition with no s/s of distress noted.

## 2020-12-04 ENCOUNTER — Ambulatory Visit: Payer: 59 | Admitting: Internal Medicine

## 2020-12-04 ENCOUNTER — Inpatient Hospital Stay (HOSPITAL_COMMUNITY): Payer: 59

## 2020-12-04 ENCOUNTER — Other Ambulatory Visit: Payer: Self-pay

## 2020-12-04 VITALS — BP 138/75 | HR 82 | Temp 97.5°F | Resp 18

## 2020-12-04 DIAGNOSIS — C8510 Unspecified B-cell lymphoma, unspecified site: Secondary | ICD-10-CM

## 2020-12-04 DIAGNOSIS — Z5111 Encounter for antineoplastic chemotherapy: Secondary | ICD-10-CM | POA: Diagnosis not present

## 2020-12-04 MED ORDER — SODIUM CHLORIDE 0.9 % IV SOLN
Freq: Once | INTRAVENOUS | Status: AC
  Administered 2020-12-04: 8 mg via INTRAVENOUS
  Filled 2020-12-04: qty 4

## 2020-12-04 MED ORDER — SODIUM CHLORIDE 0.9 % IV SOLN
INTRAVENOUS | Status: DC
Start: 2020-12-04 — End: 2020-12-06

## 2020-12-04 MED ORDER — PREDNISONE 50 MG PO TABS
100.0000 mg | ORAL_TABLET | Freq: Once | ORAL | Status: AC
  Administered 2020-12-04: 100 mg via ORAL
  Filled 2020-12-04: qty 2

## 2020-12-04 MED ORDER — VINCRISTINE SULFATE CHEMO INJECTION 1 MG/ML
Freq: Once | INTRAVENOUS | Status: AC
  Filled 2020-12-04: qty 10

## 2020-12-05 ENCOUNTER — Inpatient Hospital Stay (HOSPITAL_COMMUNITY): Payer: 59

## 2020-12-05 DIAGNOSIS — Z5111 Encounter for antineoplastic chemotherapy: Secondary | ICD-10-CM | POA: Diagnosis not present

## 2020-12-05 DIAGNOSIS — C8510 Unspecified B-cell lymphoma, unspecified site: Secondary | ICD-10-CM

## 2020-12-05 MED ORDER — PREDNISONE 50 MG PO TABS
100.0000 mg | ORAL_TABLET | Freq: Once | ORAL | Status: AC
  Administered 2020-12-05: 100 mg via ORAL
  Filled 2020-12-05: qty 2

## 2020-12-05 MED ORDER — VINCRISTINE SULFATE CHEMO INJECTION 1 MG/ML
Freq: Once | INTRAVENOUS | Status: AC
  Filled 2020-12-05: qty 10

## 2020-12-05 MED ORDER — SODIUM CHLORIDE 0.9 % IV SOLN
Freq: Once | INTRAVENOUS | Status: AC
  Administered 2020-12-05: 8 mg via INTRAVENOUS
  Filled 2020-12-05: qty 4

## 2020-12-05 MED ORDER — SODIUM CHLORIDE 0.9 % IV SOLN: INTRAVENOUS | Status: DC

## 2020-12-05 NOTE — Progress Notes (Signed)
Ambulatory pump discontinued and restarted as ordered.  Pt discharged ambulatory in stable condition with pump infusing.

## 2020-12-06 ENCOUNTER — Other Ambulatory Visit: Payer: Self-pay

## 2020-12-06 ENCOUNTER — Encounter (HOSPITAL_COMMUNITY): Payer: Self-pay

## 2020-12-06 ENCOUNTER — Inpatient Hospital Stay (HOSPITAL_COMMUNITY): Payer: 59

## 2020-12-06 VITALS — BP 116/56 | HR 67 | Temp 97.7°F | Resp 18

## 2020-12-06 DIAGNOSIS — C8510 Unspecified B-cell lymphoma, unspecified site: Secondary | ICD-10-CM

## 2020-12-06 DIAGNOSIS — Z5111 Encounter for antineoplastic chemotherapy: Secondary | ICD-10-CM | POA: Diagnosis not present

## 2020-12-06 MED ORDER — PREDNISONE 50 MG PO TABS
100.0000 mg | ORAL_TABLET | Freq: Once | ORAL | Status: AC
  Administered 2020-12-06: 100 mg via ORAL
  Filled 2020-12-06: qty 2

## 2020-12-06 MED ORDER — HEPARIN SOD (PORK) LOCK FLUSH 100 UNIT/ML IV SOLN
500.0000 [IU] | Freq: Once | INTRAVENOUS | Status: AC
  Administered 2020-12-06: 500 [IU] via INTRAVENOUS

## 2020-12-06 MED ORDER — SODIUM CHLORIDE 0.9 % IV SOLN
Freq: Once | INTRAVENOUS | Status: AC
  Administered 2020-12-06: 16 mg via INTRAVENOUS
  Filled 2020-12-06: qty 8

## 2020-12-06 MED ORDER — SODIUM CHLORIDE 0.9 % IV SOLN: INTRAVENOUS | Status: DC

## 2020-12-06 MED ORDER — SODIUM CHLORIDE 0.9 % IV SOLN
750.0000 mg/m2 | Freq: Once | INTRAVENOUS | Status: AC
  Administered 2020-12-06: 1420 mg via INTRAVENOUS
  Filled 2020-12-06: qty 71

## 2020-12-06 NOTE — Progress Notes (Signed)
Infusion tolerated without incident or complaint. VSS upon completion of treatment. Port flushed and deaccessed per protocol, see MAR and IV flowsheet for details. Discharged ambulatory in satisfactory condition with follow up instructions. 

## 2020-12-09 ENCOUNTER — Encounter (HOSPITAL_COMMUNITY): Payer: Self-pay

## 2020-12-09 ENCOUNTER — Other Ambulatory Visit: Payer: Self-pay

## 2020-12-09 ENCOUNTER — Other Ambulatory Visit (HOSPITAL_COMMUNITY): Payer: Self-pay | Admitting: *Deleted

## 2020-12-09 ENCOUNTER — Inpatient Hospital Stay (HOSPITAL_COMMUNITY): Payer: 59

## 2020-12-09 VITALS — BP 114/53 | HR 84 | Temp 97.3°F | Resp 18

## 2020-12-09 DIAGNOSIS — C8510 Unspecified B-cell lymphoma, unspecified site: Secondary | ICD-10-CM

## 2020-12-09 DIAGNOSIS — Z5111 Encounter for antineoplastic chemotherapy: Secondary | ICD-10-CM | POA: Diagnosis not present

## 2020-12-09 MED ORDER — PEGFILGRASTIM-CBQV 6 MG/0.6ML ~~LOC~~ SOSY
6.0000 mg | PREFILLED_SYRINGE | Freq: Once | SUBCUTANEOUS | Status: AC
  Administered 2020-12-09: 6 mg via SUBCUTANEOUS
  Filled 2020-12-09: qty 0.6

## 2020-12-09 MED ORDER — OXYCODONE HCL 5 MG PO TABS: 10.0000 mg | ORAL_TABLET | Freq: Two times a day (BID) | ORAL | 0 refills | Status: DC | PRN

## 2020-12-09 NOTE — Progress Notes (Signed)
Patient tolerated injection with no complaints voiced.  Site clean and dry with no bruising or swelling noted at site.  Band aid applied.  Vss with discharge and left in satisfactory condition with no s/s of distress noted.  

## 2020-12-23 ENCOUNTER — Other Ambulatory Visit: Payer: Self-pay

## 2020-12-23 ENCOUNTER — Inpatient Hospital Stay (HOSPITAL_COMMUNITY): Payer: 59

## 2020-12-23 ENCOUNTER — Encounter (HOSPITAL_COMMUNITY): Payer: Self-pay | Admitting: Hematology

## 2020-12-23 ENCOUNTER — Inpatient Hospital Stay (HOSPITAL_COMMUNITY): Payer: 59 | Attending: Hematology | Admitting: Hematology

## 2020-12-23 VITALS — BP 141/90 | HR 79 | Temp 97.1°F | Resp 18 | Wt 166.2 lb

## 2020-12-23 VITALS — BP 131/77 | HR 72 | Temp 96.9°F | Resp 18

## 2020-12-23 DIAGNOSIS — F17210 Nicotine dependence, cigarettes, uncomplicated: Secondary | ICD-10-CM | POA: Diagnosis not present

## 2020-12-23 DIAGNOSIS — D6959 Other secondary thrombocytopenia: Secondary | ICD-10-CM | POA: Insufficient documentation

## 2020-12-23 DIAGNOSIS — Z5112 Encounter for antineoplastic immunotherapy: Secondary | ICD-10-CM | POA: Insufficient documentation

## 2020-12-23 DIAGNOSIS — Z800 Family history of malignant neoplasm of digestive organs: Secondary | ICD-10-CM | POA: Diagnosis not present

## 2020-12-23 DIAGNOSIS — R161 Splenomegaly, not elsewhere classified: Secondary | ICD-10-CM | POA: Diagnosis not present

## 2020-12-23 DIAGNOSIS — Z5189 Encounter for other specified aftercare: Secondary | ICD-10-CM | POA: Diagnosis not present

## 2020-12-23 DIAGNOSIS — T451X5A Adverse effect of antineoplastic and immunosuppressive drugs, initial encounter: Secondary | ICD-10-CM | POA: Diagnosis not present

## 2020-12-23 DIAGNOSIS — C8510 Unspecified B-cell lymphoma, unspecified site: Secondary | ICD-10-CM | POA: Diagnosis not present

## 2020-12-23 DIAGNOSIS — E876 Hypokalemia: Secondary | ICD-10-CM | POA: Diagnosis not present

## 2020-12-23 DIAGNOSIS — Z5111 Encounter for antineoplastic chemotherapy: Secondary | ICD-10-CM | POA: Diagnosis present

## 2020-12-23 DIAGNOSIS — G620 Drug-induced polyneuropathy: Secondary | ICD-10-CM | POA: Diagnosis not present

## 2020-12-23 DIAGNOSIS — Z79899 Other long term (current) drug therapy: Secondary | ICD-10-CM | POA: Diagnosis not present

## 2020-12-23 DIAGNOSIS — Z807 Family history of other malignant neoplasms of lymphoid, hematopoietic and related tissues: Secondary | ICD-10-CM | POA: Insufficient documentation

## 2020-12-23 LAB — CBC WITH DIFFERENTIAL/PLATELET
Abs Immature Granulocytes: 0.04 10*3/uL (ref 0.00–0.07)
Basophils Absolute: 0 10*3/uL (ref 0.0–0.1)
Basophils Relative: 0 %
Eosinophils Absolute: 0 10*3/uL (ref 0.0–0.5)
Eosinophils Relative: 0 %
HCT: 36.4 % — ABNORMAL LOW (ref 39.0–52.0)
Hemoglobin: 11.8 g/dL — ABNORMAL LOW (ref 13.0–17.0)
Immature Granulocytes: 0 %
Lymphocytes Relative: 9 %
Lymphs Abs: 0.9 10*3/uL (ref 0.7–4.0)
MCH: 32.6 pg (ref 26.0–34.0)
MCHC: 32.4 g/dL (ref 30.0–36.0)
MCV: 100.6 fL — ABNORMAL HIGH (ref 80.0–100.0)
Monocytes Absolute: 0.8 10*3/uL (ref 0.1–1.0)
Monocytes Relative: 8 %
Neutro Abs: 7.9 10*3/uL — ABNORMAL HIGH (ref 1.7–7.7)
Neutrophils Relative %: 83 %
Platelets: 126 10*3/uL — ABNORMAL LOW (ref 150–400)
RBC: 3.62 MIL/uL — ABNORMAL LOW (ref 4.22–5.81)
RDW: 15.9 % — ABNORMAL HIGH (ref 11.5–15.5)
WBC: 9.6 10*3/uL (ref 4.0–10.5)
nRBC: 0 % (ref 0.0–0.2)

## 2020-12-23 LAB — COMPREHENSIVE METABOLIC PANEL
ALT: 14 U/L (ref 0–44)
AST: 18 U/L (ref 15–41)
Albumin: 3.9 g/dL (ref 3.5–5.0)
Alkaline Phosphatase: 73 U/L (ref 38–126)
Anion gap: 9 (ref 5–15)
BUN: 18 mg/dL (ref 6–20)
CO2: 24 mmol/L (ref 22–32)
Calcium: 9.4 mg/dL (ref 8.9–10.3)
Chloride: 106 mmol/L (ref 98–111)
Creatinine, Ser: 1.11 mg/dL (ref 0.61–1.24)
GFR, Estimated: >60 mL/min (ref >60)
Glucose, Bld: 131 mg/dL — ABNORMAL HIGH (ref 70–99)
Potassium: 3.9 mmol/L (ref 3.5–5.1)
Sodium: 139 mmol/L (ref 135–145)
Total Bilirubin: 0.5 mg/dL (ref 0.3–1.2)
Total Protein: 6.8 g/dL (ref 6.5–8.1)

## 2020-12-23 LAB — LACTATE DEHYDROGENASE: LDH: 146 U/L (ref 98–192)

## 2020-12-23 LAB — PHOSPHORUS: Phosphorus: 4.3 mg/dL (ref 2.5–4.6)

## 2020-12-23 LAB — URIC ACID: Uric Acid, Serum: 6.6 mg/dL (ref 3.7–8.6)

## 2020-12-23 MED ORDER — SODIUM CHLORIDE 0.9 % IV SOLN
375.0000 mg/m2 | Freq: Once | INTRAVENOUS | Status: AC
  Administered 2020-12-23: 700 mg via INTRAVENOUS
  Filled 2020-12-23: qty 50

## 2020-12-23 MED ORDER — ACETAMINOPHEN 325 MG PO TABS
650.0000 mg | ORAL_TABLET | Freq: Once | ORAL | Status: AC
  Administered 2020-12-23: 650 mg via ORAL
  Filled 2020-12-23: qty 2

## 2020-12-23 MED ORDER — FAMOTIDINE IN NACL 20-0.9 MG/50ML-% IV SOLN
20.0000 mg | Freq: Once | INTRAVENOUS | Status: AC
  Administered 2020-12-23: 20 mg via INTRAVENOUS
  Filled 2020-12-23: qty 50

## 2020-12-23 MED ORDER — SODIUM CHLORIDE 0.9 % IV SOLN
Freq: Once | INTRAVENOUS | Status: AC
  Administered 2020-12-23: 8 mg via INTRAVENOUS
  Filled 2020-12-23: qty 4

## 2020-12-23 MED ORDER — DIPHENHYDRAMINE HCL 50 MG/ML IJ SOLN
50.0000 mg | Freq: Once | INTRAMUSCULAR | Status: AC
  Administered 2020-12-23: 50 mg via INTRAVENOUS
  Filled 2020-12-23: qty 1

## 2020-12-23 MED ORDER — VINCRISTINE SULFATE CHEMO INJECTION 1 MG/ML
Freq: Once | INTRAVENOUS | Status: AC
  Filled 2020-12-23: qty 10

## 2020-12-23 MED ORDER — SODIUM CHLORIDE 0.9 % IV SOLN: INTRAVENOUS | Status: DC

## 2020-12-23 NOTE — Patient Instructions (Signed)
[place] at [facility] Discharge Instructions  You were seen today by Dr. Delton Coombes. He went over your recent results. You received your treatment today. Drink plenty of water to stay hydrated and to keep your kidneys flushed. You will be scheduled for a PET scan before your next visit. Dr. Delton Coombes will see you back in 3 weeks for labs and follow up.   Thank you for choosing [place] at [facility] to provide your oncology and hematology care.  To afford each patient quality time with our provider, please arrive at least 15 minutes before your scheduled appointment time.   If you have a lab appointment with the [facility] please come in thru the Main Entrance and check in at the main information desk  You need to re-schedule your appointment should you arrive 10 or more minutes late.  We strive to give you quality time with our providers, and arriving late affects you and other patients whose appointments are after yours.  Also, if you no show three or more times for appointments you may be dismissed from the clinic at the providers discretion.     Again, thank you for choosing [facility].  Our hope is that these requests will decrease the amount of time that you wait before being seen by our physicians.       _____________________________________________________________  Should you have questions after your visit to [facility], please contact our office at [phone] between the hours of 8:00 a.m. and 4:30 p.m.  Voicemails left after 4:00 p.m. will not be returned until the following business day.  For prescription refill requests, have your pharmacy contact our office and allow 72 hours.    Cancer Center Support Programs:   > Cancer Support Group  2nd Tuesday of the month 1pm-2pm, Journey Room

## 2020-12-23 NOTE — Patient Instructions (Signed)
[facility] at [facility]  Discharge Instructions:   _______________________________________________________________  Thank you for choosing [facility] at [facility] to provide your oncology and hematology care.  To afford each patient quality time with our providers, please arrive at least 15 minutes before your scheduled appointment.  You need to re-schedule your appointment if you arrive 10 or more minutes late.  We strive to give you quality time with our providers, and arriving late affects you and other patients whose appointments are after yours.  Also, if you no show three or more times for appointments you may be dismissed from the clinic.  Again, thank you for choosing [facility] at [facility]. Our hope is that these requests will allow you access to exceptional care and in a timely manner. _______________________________________________________________  If you have questions after your visit, please contact our office at [phone] between the hours of 8:30 a.m. and 5:00 p.m. Voicemails left after 4:30 p.m. will not be returned until the following business day. _______________________________________________________________  For prescription refill requests, have your pharmacy contact our office. _______________________________________________________________  Recommendations made by the consultant and any test results will be sent to your referring physician. _______________________________________________________________ 

## 2020-12-23 NOTE — Patient Instructions (Signed)
[facility] Discharge Instructions for Patients Receiving Chemotherapy  Today you received the following chemotherapy agents   To help prevent nausea and vomiting after your treatment, we encourage you to take your nausea medication   If you develop nausea and vomiting that is not controlled by your nausea medication, call the clinic.   BELOW ARE SYMPTOMS THAT SHOULD BE REPORTED IMMEDIATELY:  *FEVER GREATER THAN 100.5 F  *CHILLS WITH OR WITHOUT FEVER  NAUSEA AND VOMITING THAT IS NOT CONTROLLED WITH YOUR NAUSEA MEDICATION  *UNUSUAL SHORTNESS OF BREATH  *UNUSUAL BRUISING OR BLEEDING  TENDERNESS IN MOUTH AND THROAT WITH OR WITHOUT PRESENCE OF ULCERS  *URINARY PROBLEMS  *BOWEL PROBLEMS  UNUSUAL RASH Items with * indicate a potential emergency and should be followed up as soon as possible.  Feel free to call the clinic should you have any questions or concerns. The clinic phone number is [phone].  Please show the CHEMO ALERT CARD at check-in to the Emergency Department and triage nurse.   

## 2020-12-23 NOTE — Progress Notes (Signed)
Patient was assessed by Dr. Katragadda and labs have been reviewed.  Patient is okay to proceed with treatment today. Primary RN and pharmacy aware.   

## 2020-12-23 NOTE — Progress Notes (Unsigned)
Patient presents today for treatment and follow up visit with Dr. Delton Coombes. Patient denies any pain today. Patient has complaints of skin being very dry and itchy. No rash noted. Patient's vital signs and labs within parameters for treatment today.   Message received from Dr. Delton Coombes CEdwards LPN. Proceed with treatment today.   Treatment given today per MD orders. Tolerated infusion without adverse affects. Vital signs stable. No complaints at this time. Discharged from clinic ambulatory in stable condition. Pump infusing per protocol. RUN noted on screen and verified with the patient.  Alert and oriented x 3. F/U with [facility] as scheduled.

## 2020-12-23 NOTE — Progress Notes (Signed)
[place], [place] [address]   CLINIC:  Medical Oncology/Hematology  PCP:  Lindell Spar, MD [address] / [place] [address] [phone]   REASON FOR VISIT:  Follow-up for high-grade B-cell lymphoma  PRIOR THERAPY: None  NGS Results: [employer] BCL6 and MYC rearrangement detected  CURRENT THERAPY: R-EPOCH every 3 weeks  BRIEF ONCOLOGIC HISTORY:  Oncology History  High grade B-cell lymphoma ([place])  08/21/2020 Initial Diagnosis   High grade B-cell lymphoma ([place])   09/02/2020 -  Chemotherapy    Patient is on Treatment Plan: IP NON-HODGKINS LYMPHOMA R-EPOCH Q21D      09/02/2020 Genetic Testing         CANCER STAGING: Cancer Staging No matching staging information was found for the patient.  INTERVAL HISTORY:  [name], a 40 y.o. male, returns for routine follow-up and consideration for final cycle of chemotherapy. Marquel was last seen on 12/02/2020.  Due for cycle #6 of R-EPOCH today.   Overall, he tells me he has been feeling okay. He notes having occasional trouble swallowing food and liquids, usually once a day especially when his throat is dry, which has been going on for several months, but denies having vomiting; his nausea is controlled with Zofran. The numbness in his fingertips is slightly improved with dose reduction. He continues taking potassium. His appetite is good.  Overall, he feels ready for final cycle of chemo today.    REVIEW OF SYSTEMS:  Review of Systems  Constitutional: Positive for appetite change (75%) and fatigue (85%).  HENT:   Positive for trouble swallowing (solids & liquids d/t dry throat).   Gastrointestinal: Positive for nausea.  Neurological: Positive for dizziness (intermittent) and numbness (fingertips).  Psychiatric/Behavioral: Positive for sleep disturbance.  All other systems reviewed and are negative.   PAST MEDICAL/SURGICAL HISTORY:  Past Medical History:   Diagnosis Date  . CHF (congestive heart failure) ([place])   . Essential hypertension   . HIV infection ([place])   . Noncompliance with medication regimen   . 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 IR IMAGING GUIDED PORT INSERTION  08/23/2020   Right  . NO PAST SURGERIES      SOCIAL HISTORY:  Social History   Socioeconomic History  . Marital status: Single    Spouse name: Not on file  . Number of children: 0  . Years of education: 91  . Highest education level: Not on file  Occupational History  . Not on file  Tobacco Use  . Smoking status: Light Tobacco Smoker    Packs/day: 0.25    Types: Cigarettes  . Smokeless tobacco: Never Used  Vaping Use  . Vaping Use: Never used  Substance and Sexual Activity  . Alcohol use: Not Currently  . Drug use: Not Currently    Types: Marijuana    Comment: x 1 week  . Sexual activity: Not Currently    Comment: pt given condoms  Other Topics Concern  . Not on file  Social History Narrative  . Not on file   Social Determinants of Health   Financial Resource Strain: Low Risk   . Difficulty of Paying Living Expenses: Not very hard  Food Insecurity: No Food Insecurity  . Worried About Charity fundraiser in the Last Year: Never true  . Ran Out of Food in the Last Year: Never true  Transportation Needs: No Transportation Needs  . Lack  of Transportation (Medical): No  . Lack of Transportation (Non-Medical): No  Physical Activity: Insufficiently Active  . Days of Exercise per Week: 5 days  . Minutes of Exercise per Session: 20 min  Stress: No Stress Concern Present  . Feeling of Stress : Only a little  Social Connections: Socially Isolated  . Frequency of Communication with Friends and Family: More than three times a week  . Frequency of Social Gatherings with Friends and Family: Never  . Attends Religious Services: Never  . Active Member of Clubs or Organizations: No  . Attends Theatre manager Meetings: Not on file  . Marital Status: Never married  Intimate Partner Violence: Not At Risk  . Fear of Current or Ex-Partner: No  . Emotionally Abused: No  . Physically Abused: No  . Sexually Abused: No    FAMILY HISTORY:  Family History  Problem Relation Age of Onset  . Diabetes Mellitus II Mother   . Cancer Mother        multiple myeloma  . Cancer Father        multiple myeloma  . Congestive Heart Failure Brother   . Heart Problems Brother   . Lupus Brother     CURRENT MEDICATIONS:  Current Outpatient Medications  Medication Sig Dispense Refill  . acetaminophen (TYLENOL) 500 MG tablet Take 1,000 mg by mouth every 6 (six) hours as needed for moderate pain or headache.    . allopurinol (ZYLOPRIM) 300 MG tablet Take 1 tablet (300 mg total) by mouth daily. 30 tablet 3  . bictegravir-emtricitabine-tenofovir AF (BIKTARVY) 50-200-25 MG TABS tablet Take 1 tablet by mouth daily. 30 tablet 5  . carvedilol (COREG) 6.25 MG tablet Take 1 tablet (6.25 mg total) by mouth 2 (two) times daily with a meal. For BP and Heart (Patient taking differently: Take 6.25 mg by mouth 2 (two) times daily with a meal.) 60 tablet 11  . dicyclomine (BENTYL) 10 MG capsule     . docusate sodium (COLACE) 100 MG capsule Take 100 mg by mouth daily as needed for mild constipation.    . furosemide (LASIX) 40 MG tablet Take 1 tablet (40 mg total) by mouth daily. 30 tablet 3  . hydrALAZINE (APRESOLINE) 50 MG tablet Take 50 mg by mouth 3 (three) times daily.    . isosorbide mononitrate (IMDUR) 30 MG 24 hr tablet Take 30 mg by mouth daily.    Marland Kitchen lidocaine-prilocaine (EMLA) cream Apply 1 application topically as needed. 30 g 0  . losartan (COZAAR) 50 MG tablet Take 1 tablet (50 mg total) by mouth daily. 90 tablet 3  . ondansetron (ZOFRAN-ODT) 4 MG disintegrating tablet     . oxyCODONE (OXY IR/ROXICODONE) 5 MG immediate release tablet Take 2 tablets (10 mg total) by mouth every 12 (twelve) hours as needed  for severe pain. 120 tablet 0  . potassium chloride (KLOR-CON) 20 MEQ tablet Take 0.5 tablets (10 mEq total) by mouth daily. 30 tablet 1  . prochlorperazine (COMPAZINE) 10 MG tablet Take 1 tablet (10 mg total) by mouth every 6 (six) hours as needed for nausea or vomiting. 30 tablet 2  . sulfamethoxazole-trimethoprim (BACTRIM DS) 800-160 MG tablet Take 1 tablet by mouth daily. 30 tablet 5   No current facility-administered medications for this visit.   Facility-Administered Medications Ordered in Other Visits  Medication Dose Route Frequency Provider Last Rate Last Admin  . 0.9 %  sodium chloride infusion   Intravenous Continuous Derek Jack, MD      .  0.9 %  sodium chloride infusion   Intravenous Continuous Derek Jack, MD   Stopped at 09/26/20 1124  . 0.9 %  sodium chloride infusion   Intravenous Continuous Derek Jack, MD 20 mL/hr at 11/04/20 0934 New Bag at 11/04/20 0934  . 0.9 %  sodium chloride infusion   Intravenous Continuous Derek Jack, MD 500 mL/hr at 11/04/20 0935 Bolus from Bag at 11/04/20 0935  . 0.9 %  sodium chloride infusion   Intravenous Continuous Derek Jack, MD   Stopped at 11/07/20 1110  . sodium chloride flush (NS) 0.9 % injection 10 mL  10 mL Intravenous PRN Derek Jack, MD   10 mL at 10/15/20 1035  . sodium chloride flush (NS) 0.9 % injection 10 mL  10 mL Intracatheter PRN Derek Jack, MD   10 mL at 11/04/20 0800  . sodium chloride flush (NS) 0.9 % injection 10 mL  10 mL Intravenous PRN Derek Jack, MD   10 mL at 11/07/20 1046    ALLERGIES:  Allergies  Allergen Reactions  . Neosporin [Neomycin-Bacitracin Zn-Polymyx] Rash    PHYSICAL EXAM:  Performance status (ECOG): 0 - Asymptomatic  Vitals:   12/23/20 0919  BP: (!) 141/90  Pulse: 79  Resp: 18  Temp: (!) 97.1 F (36.2 C)  SpO2: 100%   Wt Readings from Last 3 Encounters:  12/23/20 166 lb 3.2 oz (75.4 kg)  12/02/20 170 lb 3.2 oz (77.2  kg)  11/11/20 170 lb 9.6 oz (77.4 kg)   Physical Exam Vitals reviewed.  Constitutional:      Appearance: Normal appearance.  HENT:     Mouth/Throat:     Lips: No lesions.     Tongue: No lesions.  Cardiovascular:     Rate and Rhythm: Normal rate and regular rhythm.     Pulses: Normal pulses.     Heart sounds: Normal heart sounds.  Pulmonary:     Effort: Pulmonary effort is normal.     Breath sounds: Normal breath sounds.  Chest:     Comments: Port-a-Cath in R chest Abdominal:     Palpations: Abdomen is soft. There is no mass.     Tenderness: There is no abdominal tenderness.     Hernia: No hernia is present.  Neurological:     General: No focal deficit present.     Mental Status: He is alert and oriented to person, place, and time.  Psychiatric:        Mood and Affect: Mood normal.        Behavior: Behavior normal.     LABORATORY DATA:  I have reviewed the labs as listed.  CBC Latest Ref Rng & Units 12/23/2020 12/02/2020 11/04/2020  WBC 4.0 - 10.5 K/uL 9.6 4.6 8.7  Hemoglobin 13.0 - 17.0 g/dL 11.8(L) 10.6(L) 10.7(L)  Hematocrit 39.0 - 52.0 % 36.4(L) 32.6(L) 33.6(L)  Platelets 150 - 400 K/uL 126(L) 114(L) 128(L)   CMP Latest Ref Rng & Units 12/23/2020 12/02/2020 11/04/2020  Glucose 70 - 99 mg/dL 131(H) 93 137(H)  BUN 6 - 20 mg/dL 18 22(H) 20  Creatinine 0.61 - 1.24 mg/dL 1.11 1.17 1.60(H)  Sodium 135 - 145 mmol/L 139 137 138  Potassium 3.5 - 5.1 mmol/L 3.9 3.7 3.6  Chloride 98 - 111 mmol/L 106 103 104  CO2 22 - 32 mmol/L '24 22 23  ' Calcium 8.9 - 10.3 mg/dL 9.4 9.3 9.3  Total Protein 6.5 - 8.1 g/dL 6.8 6.9 7.2  Total Bilirubin 0.3 - 1.2 mg/dL 0.5 0.6 0.5  Alkaline Phos 38 - 126 U/L 73 54 72  AST 15 - 41 U/L '18 19 24  ' ALT 0 - 44 U/L '14 17 18   ' Lab Results  Component Value Date   LDH 146 12/23/2020   LDH 144 12/02/2020   LDH 179 11/04/2020    DIAGNOSTIC IMAGING:  I have independently reviewed the scans and discussed with the patient. No results found.    ASSESSMENT:  1.B-cell high-grade lymphoma: -Presentation to the ER with left upper quadrant and mid abdominal pain for 2 weeks. -10 pound weight loss in the last 2 weeks due to decreased appetite. Denies any fevers or night sweats. -CT AP with contrast on 07/24/2020 shows 13.8 11.2 cm mass in the epigastric region, lobulated large mass in the mesenteric region, 9.3 x 5.1 cm mass in the inferior pelvis, 9.7 x 4.1 cm mass in the superior pelvis. Epigastric mass seems to abut posterior spleen. -LDH was elevated at 309, uric acid 10.2. -PET scan on 07/31/2020 showed extensive multifocal soft tissue masses within the abdomen and pelvis, most of them between SUV 15 and 20. FDG avid subpleural nodules overlying the right lung. Splenomegaly. Multiple scattered lung nodules, nonspecific. -Biopsy of the soft tissue mass in the upper abdomen on 08/15/2020 consistent with high-grade B-cell lymphoma. IHC positive for CD20, CD10, BCL6, negative for BCL-2, CD3, CD5, CD30, mum 1 and EBV. Ki-67 is 80-90%. 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 -He was evaluated by Dr. Eldridge Abrahams [facility] and was not felt to be a candidate for clinical trial. -Cycle 1 of R-EPOCH on 09/05/2020. -PET scan on 10/28/2020 showed marked interval response with most of the sites of hypermetabolic disease with no residual hypermetabolism. One of the more dominant foci of disease in the left upper quadrant of the abdomen does show low-level of hypermetabolism compatible with [place] 4. No new hypermetabolic disease. Persistent splenomegaly without hypermetabolism in the parenchyma.  2. Social/family history: -He quit smoking last year. -Family history significant for mother and father who had multiple myeloma. Grandfather had colon cancer.  3. 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  4. CHF: -Echocardiogram on 09/16/2020 with EF  50%. -Echocardiogram on 08/22/2020 with EF 45-50%. -Echocardiogram on 09/18/2019 shows EF 40-45%. -He is on Coreg 6.25 mg twice daily, Imdur 30 mg daily. Lasix 40 mg is an expired medication list.   PLAN:  1.Double hitlarge B celllymphoma: -He has completed 5 cycles of chemotherapy. -He did not experience any major side effects from chemo.  Neuropathy has been stable. -He has intermittent trouble swallowing liquids and solids since last treatment.  No thrush in the oropharyngeal region.  Will keep close eye on it. -I have reviewed his LFTs and CBC which are adequate to proceed with treatment today.  Mild thrombocytopenia with platelet count 126. -He will proceed with cycle 6 today.  We will maintain the dose reduction of vincristine. -RTC 3 weeks with PET scan.  2. Abdominal pain: -He is rarely requiring oxycodone.  3. Nausea: -Continue Zofran ODT every 8 hours as needed.  4. Neuropathy: -Constant numbness in the fingertips is stable.  We have cut back on vincristine dose by 20% during cycle 5.  5. Hypokalemia: -Continue potassium 20 mEq daily.   Orders placed this encounter:  Orders Placed This Encounter  Procedures  . NM PET Image Restag (PS) Skull Base To Thigh  . CBC with Differential/Platelet  . Comprehensive metabolic panel  .  Lactate dehydrogenase     Derek Jack, MD [place] [phone]   I, Milinda Antis, am acting as a scribe for Dr. Sanda Linger.  I, Derek Jack MD, have reviewed the above documentation for accuracy and completeness, and I agree with the above.

## 2020-12-24 ENCOUNTER — Inpatient Hospital Stay (HOSPITAL_COMMUNITY): Payer: 59

## 2020-12-24 VITALS — BP 153/84 | HR 76 | Temp 97.5°F | Resp 17

## 2020-12-24 DIAGNOSIS — C8510 Unspecified B-cell lymphoma, unspecified site: Secondary | ICD-10-CM

## 2020-12-24 DIAGNOSIS — Z5112 Encounter for antineoplastic immunotherapy: Secondary | ICD-10-CM | POA: Diagnosis not present

## 2020-12-24 MED ORDER — ONDANSETRON HCL 40 MG/20ML IJ SOLN
Freq: Once | INTRAMUSCULAR | Status: AC
  Administered 2020-12-24: 8 mg via INTRAVENOUS
  Filled 2020-12-24: qty 4

## 2020-12-24 MED ORDER — PREDNISONE 50 MG PO TABS
100.0000 mg | ORAL_TABLET | Freq: Once | ORAL | Status: AC
  Administered 2020-12-24: 100 mg via ORAL
  Filled 2020-12-24: qty 2

## 2020-12-24 MED ORDER — VINCRISTINE SULFATE CHEMO INJECTION 1 MG/ML
Freq: Once | INTRAVENOUS | Status: AC
  Filled 2020-12-24: qty 10

## 2020-12-24 NOTE — Patient Instructions (Signed)
[facility] Discharge Instructions for Patients Receiving Chemotherapy   Beginning January 23rd 2017 lab work for the Cancer Center will be done in the  Main lab at [facility] on 1st floor. If you have a lab appointment with the Cancer Center please come in thru the  Main Entrance and check in at the main information desk   Today you received the following chemotherapy agents   To help prevent nausea and vomiting after your treatment, we encourage you to take your nausea medication     If you develop nausea and vomiting, or diarrhea that is not controlled by your medication, call the clinic.  The clinic phone number is [phone]. Office hours are Monday-Friday 8:30am-5:00pm.  BELOW ARE SYMPTOMS THAT SHOULD BE REPORTED IMMEDIATELY:  *FEVER GREATER THAN 101.0 F  *CHILLS WITH OR WITHOUT FEVER  NAUSEA AND VOMITING THAT IS NOT CONTROLLED WITH YOUR NAUSEA MEDICATION  *UNUSUAL SHORTNESS OF BREATH  *UNUSUAL BRUISING OR BLEEDING  TENDERNESS IN MOUTH AND THROAT WITH OR WITHOUT PRESENCE OF ULCERS  *URINARY PROBLEMS  *BOWEL PROBLEMS  UNUSUAL RASH Items with * indicate a potential emergency and should be followed up as soon as possible. If you have an emergency after office hours please contact your primary care physician or go to the nearest emergency department.  Please call the clinic during office hours if you have any questions or concerns.   You may also contact the Patient Navigator at [phone] should you have any questions or need assistance in obtaining follow up care.      Resources For Cancer Patients and their Caregivers ? American Cancer Society: Can assist with transportation, wigs, general needs, runs Look Good Feel Better.        1-888-227-6333 ? Cancer Care: Provides financial assistance, online support groups, medication/co-pay assistance.  1-800-813-HOPE (4673) ? [facility] Assists [place] cancer  patients and their families through emotional , educational and financial support.  336-427-4357 ? [place] DSS Where to apply for food stamps, Medicaid and utility assistance. 336-342-1394 ? [facility]: Transportation to medical appointments. 336-347-2287 ? Social Security Administration: May apply for disability if have a Stage IV cancer. 336-342-7796 1-800-772-1213 ? [place] Aging, Disability and Transit Services: Assists with nutrition, care and transit needs. 336-349-2343         

## 2020-12-24 NOTE — Progress Notes (Signed)
Richard Cox presents today for [facility] pump exchange. He denies any new symptoms or complaints over night. VSS upon arrival. Premedications given per order in treatment plan, see MAR for details. EPOCH infusing via home infusion pump with RUN noted on screen. Discharged in satisfactory condition with follow up instructions.

## 2020-12-25 ENCOUNTER — Inpatient Hospital Stay (HOSPITAL_COMMUNITY): Payer: 59

## 2020-12-25 ENCOUNTER — Other Ambulatory Visit: Payer: Self-pay

## 2020-12-25 VITALS — BP 157/89 | HR 65 | Temp 97.3°F | Resp 18

## 2020-12-25 DIAGNOSIS — Z5112 Encounter for antineoplastic immunotherapy: Secondary | ICD-10-CM | POA: Diagnosis not present

## 2020-12-25 DIAGNOSIS — C8510 Unspecified B-cell lymphoma, unspecified site: Secondary | ICD-10-CM

## 2020-12-25 MED ORDER — PREDNISONE 50 MG PO TABS
100.0000 mg | ORAL_TABLET | Freq: Once | ORAL | Status: AC
  Administered 2020-12-25: 100 mg via ORAL
  Filled 2020-12-25: qty 2

## 2020-12-25 MED ORDER — SODIUM CHLORIDE 0.9 % IV SOLN
INTRAVENOUS | Status: DC
Start: 2020-12-25 — End: 2022-04-18

## 2020-12-25 MED ORDER — VINCRISTINE SULFATE CHEMO INJECTION 1 MG/ML
Freq: Once | INTRAVENOUS | Status: AC
  Filled 2020-12-25: qty 10

## 2020-12-25 MED ORDER — SODIUM CHLORIDE 0.9 % IV SOLN
Freq: Once | INTRAVENOUS | Status: AC
  Administered 2020-12-25: 8 mg via INTRAVENOUS
  Filled 2020-12-25: qty 4

## 2020-12-25 NOTE — Progress Notes (Unsigned)
Pt is here for D3.  Vital signs WNL for treatment today.  Pt connected to ambulatory pump. Discharged in stable condition ambulatory.

## 2020-12-25 NOTE — Patient Instructions (Signed)
[facility] Discharge Instructions for Patients Receiving Chemotherapy  Today you received the following chemotherapy agents   To help prevent nausea and vomiting after your treatment, we encourage you to take your nausea medication   If you develop nausea and vomiting that is not controlled by your nausea medication, call the clinic.   BELOW ARE SYMPTOMS THAT SHOULD BE REPORTED IMMEDIATELY:  *FEVER GREATER THAN 100.5 F  *CHILLS WITH OR WITHOUT FEVER  NAUSEA AND VOMITING THAT IS NOT CONTROLLED WITH YOUR NAUSEA MEDICATION  *UNUSUAL SHORTNESS OF BREATH  *UNUSUAL BRUISING OR BLEEDING  TENDERNESS IN MOUTH AND THROAT WITH OR WITHOUT PRESENCE OF ULCERS  *URINARY PROBLEMS  *BOWEL PROBLEMS  UNUSUAL RASH Items with * indicate a potential emergency and should be followed up as soon as possible.  Feel free to call the clinic should you have any questions or concerns. The clinic phone number is [phone].  Please show the CHEMO ALERT CARD at check-in to the Emergency Department and triage nurse.   

## 2020-12-26 ENCOUNTER — Inpatient Hospital Stay (HOSPITAL_COMMUNITY): Payer: 59

## 2020-12-26 VITALS — BP 137/80 | HR 73 | Temp 97.5°F | Resp 18

## 2020-12-26 DIAGNOSIS — Z5112 Encounter for antineoplastic immunotherapy: Secondary | ICD-10-CM | POA: Diagnosis not present

## 2020-12-26 DIAGNOSIS — C8510 Unspecified B-cell lymphoma, unspecified site: Secondary | ICD-10-CM

## 2020-12-26 MED ORDER — SODIUM CHLORIDE 0.9 % IV SOLN: Freq: Once | INTRAVENOUS | Status: AC

## 2020-12-26 MED ORDER — SODIUM CHLORIDE 0.9 % IV SOLN: INTRAVENOUS | Status: DC

## 2020-12-26 MED ORDER — PREDNISONE 50 MG PO TABS
100.0000 mg | ORAL_TABLET | Freq: Once | ORAL | Status: AC
  Administered 2020-12-26: 100 mg via ORAL
  Filled 2020-12-26: qty 2

## 2020-12-26 MED ORDER — VINCRISTINE SULFATE CHEMO INJECTION 1 MG/ML
Freq: Once | INTRAVENOUS | Status: AC
  Filled 2020-12-26: qty 10

## 2020-12-26 MED ORDER — SODIUM CHLORIDE 0.9 % IV SOLN
Freq: Once | INTRAVENOUS | Status: AC
  Administered 2020-12-26: 8 mg via INTRAVENOUS
  Filled 2020-12-26: qty 4

## 2020-12-26 NOTE — Progress Notes (Unsigned)
Patient presents today for treatment. Patient denies any changes since last treatment. Patient denies pain today.   Treatment given today per MD orders. Tolerated infusion without adverse affects. Vital signs stable. Infusion pump infusing per protocol. RUN noted on screen and verified with the patient.  No complaints at this time. Discharged from clinic ambulatory in stable condition. Alert and oriented x 3. F/U with [facility] as scheduled.

## 2020-12-26 NOTE — Patient Instructions (Signed)
[facility] Discharge Instructions for Patients Receiving Chemotherapy  Today you received the following chemotherapy agents   To help prevent nausea and vomiting after your treatment, we encourage you to take your nausea medication   If you develop nausea and vomiting that is not controlled by your nausea medication, call the clinic.   BELOW ARE SYMPTOMS THAT SHOULD BE REPORTED IMMEDIATELY:  *FEVER GREATER THAN 100.5 F  *CHILLS WITH OR WITHOUT FEVER  NAUSEA AND VOMITING THAT IS NOT CONTROLLED WITH YOUR NAUSEA MEDICATION  *UNUSUAL SHORTNESS OF BREATH  *UNUSUAL BRUISING OR BLEEDING  TENDERNESS IN MOUTH AND THROAT WITH OR WITHOUT PRESENCE OF ULCERS  *URINARY PROBLEMS  *BOWEL PROBLEMS  UNUSUAL RASH Items with * indicate a potential emergency and should be followed up as soon as possible.  Feel free to call the clinic should you have any questions or concerns. The clinic phone number is [phone].  Please show the CHEMO ALERT CARD at check-in to the Emergency Department and triage nurse.   

## 2020-12-27 ENCOUNTER — Inpatient Hospital Stay (HOSPITAL_COMMUNITY): Payer: 59

## 2020-12-27 ENCOUNTER — Other Ambulatory Visit: Payer: Self-pay

## 2020-12-27 VITALS — BP 148/90 | HR 77 | Temp 97.5°F | Resp 18

## 2020-12-27 DIAGNOSIS — C8510 Unspecified B-cell lymphoma, unspecified site: Secondary | ICD-10-CM

## 2020-12-27 DIAGNOSIS — Z5112 Encounter for antineoplastic immunotherapy: Secondary | ICD-10-CM | POA: Diagnosis not present

## 2020-12-27 MED ORDER — SODIUM CHLORIDE 0.9% FLUSH
10.0000 mL | Freq: Once | INTRAVENOUS | Status: AC
  Administered 2020-12-27: 10 mL

## 2020-12-27 MED ORDER — PREDNISONE 50 MG PO TABS
100.0000 mg | ORAL_TABLET | Freq: Once | ORAL | Status: AC
  Administered 2020-12-27: 100 mg via ORAL
  Filled 2020-12-27: qty 2

## 2020-12-27 MED ORDER — SODIUM CHLORIDE 0.9 % IV SOLN
Freq: Once | INTRAVENOUS | Status: AC
  Administered 2020-12-27: 16 mg via INTRAVENOUS
  Filled 2020-12-27: qty 8

## 2020-12-27 MED ORDER — SODIUM CHLORIDE 0.9 % IV SOLN: INTRAVENOUS | Status: DC

## 2020-12-27 MED ORDER — SODIUM CHLORIDE 0.9 % IV SOLN
750.0000 mg/m2 | Freq: Once | INTRAVENOUS | Status: AC
  Administered 2020-12-27: 1420 mg via INTRAVENOUS
  Filled 2020-12-27: qty 71

## 2020-12-27 MED ORDER — HEPARIN SOD (PORK) LOCK FLUSH 100 UNIT/ML IV SOLN
500.0000 [IU] | Freq: Once | INTRAVENOUS | Status: AC
  Administered 2020-12-27: 500 [IU] via INTRAVENOUS

## 2020-12-27 NOTE — Patient Instructions (Signed)
[facility] Discharge Instructions for Patients Receiving Chemotherapy  Today you received the following chemotherapy agents   To help prevent nausea and vomiting after your treatment, we encourage you to take your nausea medication   If you develop nausea and vomiting that is not controlled by your nausea medication, call the clinic.   BELOW ARE SYMPTOMS THAT SHOULD BE REPORTED IMMEDIATELY:  *FEVER GREATER THAN 100.5 F  *CHILLS WITH OR WITHOUT FEVER  NAUSEA AND VOMITING THAT IS NOT CONTROLLED WITH YOUR NAUSEA MEDICATION  *UNUSUAL SHORTNESS OF BREATH  *UNUSUAL BRUISING OR BLEEDING  TENDERNESS IN MOUTH AND THROAT WITH OR WITHOUT PRESENCE OF ULCERS  *URINARY PROBLEMS  *BOWEL PROBLEMS  UNUSUAL RASH Items with * indicate a potential emergency and should be followed up as soon as possible.  Feel free to call the clinic should you have any questions or concerns. The clinic phone number is [phone].  Please show the CHEMO ALERT CARD at check-in to the Emergency Department and triage nurse.   

## 2020-12-27 NOTE — Progress Notes (Signed)
Treatment given today per MD orders. Tolerated infusion without adverse affects. Vital signs stable. No complaints at this time. Discharged from clinic ambulatory in stable condition. Alert and oriented x 3. F/U with [facility] as scheduled.   

## 2020-12-30 ENCOUNTER — Other Ambulatory Visit: Payer: Self-pay

## 2020-12-30 ENCOUNTER — Encounter (HOSPITAL_COMMUNITY): Payer: Self-pay

## 2020-12-30 ENCOUNTER — Ambulatory Visit: Payer: 59 | Admitting: Family

## 2020-12-30 ENCOUNTER — Inpatient Hospital Stay (HOSPITAL_COMMUNITY): Payer: 59

## 2020-12-30 VITALS — BP 142/81 | HR 83 | Temp 97.7°F | Resp 18

## 2020-12-30 DIAGNOSIS — C8510 Unspecified B-cell lymphoma, unspecified site: Secondary | ICD-10-CM

## 2020-12-30 DIAGNOSIS — Z5112 Encounter for antineoplastic immunotherapy: Secondary | ICD-10-CM | POA: Diagnosis not present

## 2020-12-30 MED ORDER — PEGFILGRASTIM-CBQV 6 MG/0.6ML ~~LOC~~ SOSY
6.0000 mg | PREFILLED_SYRINGE | Freq: Once | SUBCUTANEOUS | Status: AC
  Administered 2020-12-30: 6 mg via SUBCUTANEOUS
  Filled 2020-12-30: qty 0.6

## 2020-12-30 NOTE — Progress Notes (Signed)
Patient tolerated injection with no complaints voiced.  Site clean and dry with no bruising or swelling noted at site.  Band aid applied.  Vss with discharge and left in satisfactory condition with no s/s of distress noted.  

## 2021-01-07 ENCOUNTER — Other Ambulatory Visit: Payer: Self-pay

## 2021-01-07 ENCOUNTER — Encounter (HOSPITAL_COMMUNITY)
Admission: RE | Admit: 2021-01-07 | Discharge: 2021-01-07 | Disposition: A | Discharge status: Home / Self Care | Payer: 59 | Source: Ambulatory Visit | Attending: Hematology | Admitting: Hematology

## 2021-01-07 ENCOUNTER — Other Ambulatory Visit (HOSPITAL_COMMUNITY): Payer: Self-pay

## 2021-01-07 DIAGNOSIS — C8510 Unspecified B-cell lymphoma, unspecified site: Secondary | ICD-10-CM | POA: Diagnosis present

## 2021-01-07 LAB — GLUCOSE, CAPILLARY: Glucose-Capillary: 94 mg/dL (ref 70–99)

## 2021-01-07 MED ORDER — FLUDEOXYGLUCOSE F - 18 (FDG) INJECTION
8.2900 | Freq: Once | INTRAVENOUS | Status: AC
  Administered 2021-01-07: 8.29 via INTRAVENOUS

## 2021-01-07 MED ORDER — OXYCODONE HCL 5 MG PO TABS: 5.0000 mg | ORAL_TABLET | Freq: Two times a day (BID) | ORAL | 0 refills | Status: DC | PRN

## 2021-01-14 ENCOUNTER — Inpatient Hospital Stay (HOSPITAL_COMMUNITY): Payer: 59

## 2021-01-14 ENCOUNTER — Inpatient Hospital Stay (HOSPITAL_COMMUNITY): Payer: 59 | Attending: Hematology | Admitting: Hematology

## 2021-01-14 ENCOUNTER — Other Ambulatory Visit: Payer: Self-pay

## 2021-01-14 VITALS — BP 152/85 | HR 72 | Temp 97.3°F | Resp 18 | Wt 170.2 lb

## 2021-01-14 DIAGNOSIS — I509 Heart failure, unspecified: Secondary | ICD-10-CM | POA: Insufficient documentation

## 2021-01-14 DIAGNOSIS — R5383 Other fatigue: Secondary | ICD-10-CM | POA: Insufficient documentation

## 2021-01-14 DIAGNOSIS — I110 Hypertensive heart disease with heart failure: Secondary | ICD-10-CM | POA: Insufficient documentation

## 2021-01-14 DIAGNOSIS — R634 Abnormal weight loss: Secondary | ICD-10-CM | POA: Diagnosis not present

## 2021-01-14 DIAGNOSIS — R161 Splenomegaly, not elsewhere classified: Secondary | ICD-10-CM | POA: Diagnosis not present

## 2021-01-14 DIAGNOSIS — E876 Hypokalemia: Secondary | ICD-10-CM | POA: Diagnosis not present

## 2021-01-14 DIAGNOSIS — C8510 Unspecified B-cell lymphoma, unspecified site: Secondary | ICD-10-CM

## 2021-01-14 DIAGNOSIS — R7402 Elevation of levels of lactic acid dehydrogenase (LDH): Secondary | ICD-10-CM | POA: Diagnosis not present

## 2021-01-14 DIAGNOSIS — R110 Nausea: Secondary | ICD-10-CM | POA: Insufficient documentation

## 2021-01-14 DIAGNOSIS — F17210 Nicotine dependence, cigarettes, uncomplicated: Secondary | ICD-10-CM | POA: Insufficient documentation

## 2021-01-14 DIAGNOSIS — G629 Polyneuropathy, unspecified: Secondary | ICD-10-CM | POA: Insufficient documentation

## 2021-01-14 DIAGNOSIS — R630 Anorexia: Secondary | ICD-10-CM | POA: Insufficient documentation

## 2021-01-14 DIAGNOSIS — Z79899 Other long term (current) drug therapy: Secondary | ICD-10-CM | POA: Insufficient documentation

## 2021-01-14 DIAGNOSIS — Z9221 Personal history of antineoplastic chemotherapy: Secondary | ICD-10-CM | POA: Insufficient documentation

## 2021-01-14 DIAGNOSIS — R109 Unspecified abdominal pain: Secondary | ICD-10-CM | POA: Diagnosis not present

## 2021-01-14 DIAGNOSIS — Z9225 Personal history of immunosupression therapy: Secondary | ICD-10-CM | POA: Diagnosis not present

## 2021-01-14 LAB — CBC WITH DIFFERENTIAL/PLATELET
Abs Immature Granulocytes: 0.11 10*3/uL — ABNORMAL HIGH (ref 0.00–0.07)
Basophils Absolute: 0 10*3/uL (ref 0.0–0.1)
Basophils Relative: 0 %
Eosinophils Absolute: 0 10*3/uL (ref 0.0–0.5)
Eosinophils Relative: 0 %
HCT: 34.3 % — ABNORMAL LOW (ref 39.0–52.0)
Hemoglobin: 11 g/dL — ABNORMAL LOW (ref 13.0–17.0)
Immature Granulocytes: 2 %
Lymphocytes Relative: 15 %
Lymphs Abs: 0.9 10*3/uL (ref 0.7–4.0)
MCH: 31.8 pg (ref 26.0–34.0)
MCHC: 32.1 g/dL (ref 30.0–36.0)
MCV: 99.1 fL (ref 80.0–100.0)
Monocytes Absolute: 0.7 10*3/uL (ref 0.1–1.0)
Monocytes Relative: 13 %
Neutro Abs: 3.9 10*3/uL (ref 1.7–7.7)
Neutrophils Relative %: 70 %
Platelets: 132 10*3/uL — ABNORMAL LOW (ref 150–400)
RBC: 3.46 MIL/uL — ABNORMAL LOW (ref 4.22–5.81)
RDW: 16.6 % — ABNORMAL HIGH (ref 11.5–15.5)
WBC: 5.7 10*3/uL (ref 4.0–10.5)
nRBC: 0.9 % — ABNORMAL HIGH (ref 0.0–0.2)

## 2021-01-14 LAB — LACTATE DEHYDROGENASE: LDH: 212 U/L — ABNORMAL HIGH (ref 98–192)

## 2021-01-14 LAB — COMPREHENSIVE METABOLIC PANEL
ALT: 14 U/L (ref 0–44)
AST: 17 U/L (ref 15–41)
Albumin: 4 g/dL (ref 3.5–5.0)
Alkaline Phosphatase: 67 U/L (ref 38–126)
Anion gap: 8 (ref 5–15)
BUN: 18 mg/dL (ref 6–20)
CO2: 22 mmol/L (ref 22–32)
Calcium: 9.2 mg/dL (ref 8.9–10.3)
Chloride: 108 mmol/L (ref 98–111)
Creatinine, Ser: 1.16 mg/dL (ref 0.61–1.24)
GFR, Estimated: >60 mL/min (ref >60)
Glucose, Bld: 96 mg/dL (ref 70–99)
Potassium: 4.4 mmol/L (ref 3.5–5.1)
Sodium: 138 mmol/L (ref 135–145)
Total Bilirubin: 0.4 mg/dL (ref 0.3–1.2)
Total Protein: 7 g/dL (ref 6.5–8.1)

## 2021-01-14 NOTE — Progress Notes (Signed)
[address], [place] [address]   CLINIC:  Medical Oncology/Hematology  PCP:  Lindell Spar, MD [address] / [place] [address]  [phone]  REASON FOR VISIT:  Follow-up for high-grade B-cell lymphoma  PRIOR THERAPY: R-EPOCH x 6 cycles from 09/02/2020 to 12/27/2020  CURRENT THERAPY: Surveillance  INTERVAL HISTORY:  [name], a 40 y.o. male, returns for routine follow-up for his high-grade B-cell lymphoma. Trayden was last seen on 12/23/2020.  Today he reports feeling well. His appetite and energy levels are improving. The occasional numbness in his fingertips and toes is improving, though worse at night, but denies pain. He denies having any skin rashes.   REVIEW OF SYSTEMS:  Review of Systems  Constitutional: Positive for appetite change (75%) and fatigue (75%).  Skin: Negative for rash.  Neurological: Positive for numbness (occasional numbness in fingertips & toes).  Psychiatric/Behavioral: Positive for sleep disturbance.  All other systems reviewed and are negative.   PAST MEDICAL/SURGICAL HISTORY:  Past Medical History:  Diagnosis Date  . CHF (congestive heart failure) ([place])   . Essential hypertension   . HIV infection ([place])   . Noncompliance with medication regimen   . 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 IR IMAGING GUIDED PORT INSERTION  08/23/2020   Right  . NO PAST SURGERIES      SOCIAL HISTORY:  Social History   Socioeconomic History  . Marital status: Single    Spouse name: Not on file  . Number of children: 0  . Years of education: 5  . Highest education level: Not on file  Occupational History  . Not on file  Tobacco Use  . Smoking status: Light Tobacco Smoker    Packs/day: 0.25    Types: Cigarettes  . Smokeless tobacco: Never Used  Vaping Use  . Vaping Use: Never used  Substance and Sexual Activity  . Alcohol use: Not Currently  .  Drug use: Not Currently    Types: Marijuana    Comment: x 1 week  . Sexual activity: Not Currently    Comment: pt given condoms  Other Topics Concern  . Not on file  Social History Narrative  . Not on file   Social Determinants of Health   Financial Resource Strain: Low Risk   . Difficulty of Paying Living Expenses: Not very hard  Food Insecurity: No Food Insecurity  . Worried About Charity fundraiser in the Last Year: Never true  . Ran Out of Food in the Last Year: Never true  Transportation Needs: No Transportation Needs  . Lack of Transportation (Medical): No  . Lack of Transportation (Non-Medical): No  Physical Activity: Insufficiently Active  . Days of Exercise per Week: 5 days  . Minutes of Exercise per Session: 20 min  Stress: No Stress Concern Present  . Feeling of Stress : Only a little  Social Connections: Socially Isolated  . Frequency of Communication with Friends and Family: More than three times a week  . Frequency of Social Gatherings with Friends and Family: Never  . Attends Religious Services: Never  . Active Member of Clubs or Organizations: No  . Attends Archivist Meetings: Not on file  . Marital Status: Never married  Intimate Partner Violence: Not At Risk  . Fear of Current or Ex-Partner: No  . Emotionally Abused: No  . Physically Abused: No  . Sexually Abused: No  FAMILY HISTORY:  Family History  Problem Relation Age of Onset  . Diabetes Mellitus II Mother   . Cancer Mother        multiple myeloma  . Cancer Father        multiple myeloma  . Congestive Heart Failure Brother   . Heart Problems Brother   . Lupus Brother     CURRENT MEDICATIONS:  Current Outpatient Medications  Medication Sig Dispense Refill  . acetaminophen (TYLENOL) 500 MG tablet Take 1,000 mg by mouth every 6 (six) hours as needed for moderate pain or headache.    . allopurinol (ZYLOPRIM) 300 MG tablet Take 1 tablet (300 mg total) by mouth daily. 30 tablet 3   . bictegravir-emtricitabine-tenofovir AF (BIKTARVY) 50-200-25 MG TABS tablet Take 1 tablet by mouth daily. 30 tablet 5  . carvedilol (COREG) 6.25 MG tablet Take 1 tablet (6.25 mg total) by mouth 2 (two) times daily with a meal. For BP and Heart (Patient taking differently: Take 6.25 mg by mouth 2 (two) times daily with a meal.) 60 tablet 11  . dicyclomine (BENTYL) 10 MG capsule     . docusate sodium (COLACE) 100 MG capsule Take 100 mg by mouth daily as needed for mild constipation.    . hydrALAZINE (APRESOLINE) 50 MG tablet Take 50 mg by mouth 3 (three) times daily.    . isosorbide mononitrate (IMDUR) 30 MG 24 hr tablet Take 30 mg by mouth daily.    Marland Kitchen lidocaine-prilocaine (EMLA) cream Apply 1 application topically as needed. 30 g 0  . losartan (COZAAR) 50 MG tablet Take 1 tablet (50 mg total) by mouth daily. 90 tablet 3  . ondansetron (ZOFRAN-ODT) 4 MG disintegrating tablet     . oxyCODONE (OXY IR/ROXICODONE) 5 MG immediate release tablet Take 1 tablet (5 mg total) by mouth every 12 (twelve) hours as needed for severe pain. 60 tablet 0  . potassium chloride (KLOR-CON) 20 MEQ tablet Take 0.5 tablets (10 mEq total) by mouth daily. 30 tablet 1  . prochlorperazine (COMPAZINE) 10 MG tablet Take 1 tablet (10 mg total) by mouth every 6 (six) hours as needed for nausea or vomiting. 30 tablet 2  . sulfamethoxazole-trimethoprim (BACTRIM DS) 800-160 MG tablet Take 1 tablet by mouth daily. 30 tablet 5  . furosemide (LASIX) 40 MG tablet Take 1 tablet (40 mg total) by mouth daily. 30 tablet 3   No current facility-administered medications for this visit.   Facility-Administered Medications Ordered in Other Visits  Medication Dose Route Frequency Provider Last Rate Last Admin  . 0.9 %  sodium chloride infusion   Intravenous Continuous Derek Jack, MD      . 0.9 %  sodium chloride infusion   Intravenous Continuous Derek Jack, MD   Stopped at 09/26/20 1124  . 0.9 %  sodium chloride infusion    Intravenous Continuous Derek Jack, MD 20 mL/hr at 11/04/20 0934 New Bag at 11/04/20 0934  . 0.9 %  sodium chloride infusion   Intravenous Continuous Derek Jack, MD 500 mL/hr at 11/04/20 0935 Bolus from Bag at 11/04/20 0935  . 0.9 %  sodium chloride infusion   Intravenous Continuous Derek Jack, MD   Stopped at 11/07/20 1110  . 0.9 %  sodium chloride infusion   Intravenous Continuous Derek Jack, MD   Stopped at 12/25/20 1117  . 0.9 %  sodium chloride infusion   Intravenous Continuous Derek Jack, MD      . sodium chloride flush (NS) 0.9 % injection 10 mL  10 mL Intravenous PRN Derek Jack, MD   10 mL at 10/15/20 1035  . sodium chloride flush (NS) 0.9 % injection 10 mL  10 mL Intracatheter PRN Derek Jack, MD   10 mL at 11/04/20 0800  . sodium chloride flush (NS) 0.9 % injection 10 mL  10 mL Intravenous PRN Derek Jack, MD   10 mL at 11/07/20 1046    ALLERGIES:  Allergies  Allergen Reactions  . Neosporin [Neomycin-Bacitracin Zn-Polymyx] Rash    PHYSICAL EXAM:  Performance status (ECOG): 0 - Asymptomatic  Vitals:   01/14/21 1003  BP: (!) 152/85  Pulse: 72  Resp: 18  Temp: (!) 97.3 F (36.3 C)  SpO2: 100%   Wt Readings from Last 3 Encounters:  01/14/21 170 lb 3.1 oz (77.2 kg)  12/23/20 166 lb 3.2 oz (75.4 kg)  12/02/20 170 lb 3.2 oz (77.2 kg)   Physical Exam Vitals reviewed.  Constitutional:      Appearance: Normal appearance.  Cardiovascular:     Rate and Rhythm: Normal rate and regular rhythm.     Pulses: Normal pulses.     Heart sounds: Normal heart sounds.  Pulmonary:     Effort: Pulmonary effort is normal.     Breath sounds: Normal breath sounds.  Chest:     Comments: Port-a-Cath in R chest Abdominal:     Palpations: Abdomen is soft. There is no hepatomegaly, splenomegaly or mass.     Tenderness: There is no abdominal tenderness.     Hernia: No hernia is present.  Neurological:     General:  No focal deficit present.     Mental Status: He is alert and oriented to person, place, and time.  Psychiatric:        Mood and Affect: Mood normal.        Behavior: Behavior normal.     LABORATORY DATA:  I have reviewed the labs as listed.  CBC Latest Ref Rng & Units 01/14/2021 12/23/2020 12/02/2020  WBC 4.0 - 10.5 K/uL 5.7 9.6 4.6  Hemoglobin 13.0 - 17.0 g/dL 11.0(L) 11.8(L) 10.6(L)  Hematocrit 39.0 - 52.0 % 34.3(L) 36.4(L) 32.6(L)  Platelets 150 - 400 K/uL 132(L) 126(L) 114(L)   CMP Latest Ref Rng & Units 01/14/2021 12/23/2020 12/02/2020  Glucose 70 - 99 mg/dL 96 131(H) 93  BUN 6 - 20 mg/dL 18 18 22(H)  Creatinine 0.61 - 1.24 mg/dL 1.16 1.11 1.17  Sodium 135 - 145 mmol/L 138 139 137  Potassium 3.5 - 5.1 mmol/L 4.4 3.9 3.7  Chloride 98 - 111 mmol/L 108 106 103  CO2 22 - 32 mmol/L _0 Calcium 8.9 - 10.3 mg/dL 9.2 9.4 9.3  Total Protein 6.5 - 8.1 g/dL 7.0 6.8 6.9  Total Bilirubin 0.3 - 1.2 mg/dL 0.4 0.5 0.6  Alkaline Phos 38 - 126 U/L 67 73 54  AST 15 - 41 U/L _1 ALT 0 - 44 U/L _2 Component Value Date/Time   RBC 3.46 (L) 01/14/2021 1024   MCV 99.1 01/14/2021 1024   MCH 31.8 01/14/2021 1024   MCHC 32.1 01/14/2021 1024   RDW 16.6 (H) 01/14/2021 1024   LYMPHSABS 0.9 01/14/2021 1024   MONOABS 0.7 01/14/2021 1024   EOSABS 0.0 01/14/2021 1024   BASOSABS 0.0 01/14/2021 1024   Lab Results  Component Value Date   LDH 212 (H) 01/14/2021   LDH 146 12/23/2020   LDH 144 12/02/2020    DIAGNOSTIC IMAGING:  I have independently reviewed the scans and discussed with the patient. NM PET Image Restag (PS) Skull Base To Thigh  Result Date: 01/08/2021 CLINICAL DATA:  Subsequent treatment strategy for high-grade B-cell lymphoma. EXAM: NUCLEAR MEDICINE PET SKULL BASE TO THIGH TECHNIQUE: 8.3 mCi F-18 FDG was injected intravenously. Full-ring PET imaging was performed from the skull base to thigh after the radiotracer. CT data was obtained and used for attenuation  correction and anatomic localization. Fasting blood glucose: 94 mg/dl COMPARISON:  10/28/2020 FINDINGS: Mediastinal blood pool activity: SUV max 2.0 Liver activity: SUV max 2.6 NECK: No hypermetabolic lymph nodes in the neck. Incidental CT findings: none CHEST: No hypermetabolic mediastinal or hilar nodes. No suspicious pulmonary nodules on the CT scan. Incidental CT findings: Similar appearance of thymic remnant anterior mediastinum with tiny scattered mediastinal nodes. Heart size appears borderline to mildly increased. No pericardial effusion. Right Port-A-Cath tip is positioned in the distal SVC near the RA junction. Tiny bilateral index pulmonary nodules identified on the previous study have resolved in the interval. No new suspicious pulmonary nodule or mass. No pleural effusion. ABDOMEN/PELVIS: Index left upper quadrant lesion posterior to the stomach (image 121/4) measures slightly smaller today at 3.2 x 3.1 cm compared to 4.5 x 3.5 cm previously. This lesion still shows low level FDG accumulation with SUV max = 4.4 compared to 4.5 previously. Index right mesenteric mass described on prior studies is not easily discernible today with assessment limited by lack of oral and intravenous contrast material. 1.2 x 0.9 cm soft tissue nodule in the right paramidline mesentery (162/4) is likely the index lesion demonstrating SUV max = 1.5 today compared to 2.1 previously. No new sites of hypermetabolism identified in the abdomen/pelvis today. Incidental CT findings: Spleen measures 15.3 cm craniocaudal length today compared to 16 cm previously. SKELETON: Similar appearance of diffuse marrow uptake, likely related to stimulatory effects of therapy. Incidental CT findings: none IMPRESSION: 1. Stable exam. Left upper quadrant index lesion shows persistent low level hypermetabolism (Deauville 4). No new sites of suspicious hypermetabolic disease identified on today's study. 2. Similar appearance of splenomegaly without  hypermetabolic splenic uptake. Electronically Signed   By: Misty Stanley M.D.   On: 01/08/2021 07:56     ASSESSMENT:  1.B-cell high-grade lymphoma: -Presentation to the ER with left upper quadrant and mid abdominal pain for 2 weeks. -10 pound weight loss in the last 2 weeks due to decreased appetite. Denies any fevers or night sweats. -CT AP with contrast on 07/24/2020 shows 13.8 11.2 cm mass in the epigastric region, lobulated large mass in the mesenteric region, 9.3 x 5.1 cm mass in the inferior pelvis, 9.7 x 4.1 cm mass in the superior pelvis. Epigastric mass seems to abut posterior spleen. -LDH was elevated at 309, uric acid 10.2. -PET scan on 07/31/2020 showed extensive multifocal soft tissue masses within the abdomen and pelvis, most of them between SUV 15 and 20. FDG avid subpleural nodules overlying the right lung. Splenomegaly. Multiple scattered lung nodules, nonspecific. -Biopsy of the soft tissue mass in the upper abdomen on 08/15/2020 consistent with high-grade B-cell lymphoma. IHC positive for CD20, CD10, BCL6, negative for BCL-2, CD3, CD5, CD30, mum 1 and EBV. Ki-67 is 80-90%. 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 -He was evaluated by Dr. Eldridge Abrahams [facility] and was not felt to be a candidate for clinical trial. -6 cycles of R-EPOCH from 08/28/2020 through 12/23/2020. -PET scan on 10/28/2020 showed marked  interval response with most of the sites of hypermetabolic disease with no residual hypermetabolism. One of the more dominant foci of disease in the left upper quadrant of the abdomen does show low-level of hypermetabolism compatible with Deauville 4. No new hypermetabolic disease. Persistent splenomegaly without hypermetabolism in the parenchyma.  2. Social/family history: -He quit smoking last year. -Family history significant for mother and father who had multiple myeloma. Grandfather had colon  cancer.  3. 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  4. CHF: -Echocardiogram on 09/16/2020 with EF 50%. -Echocardiogram on 08/22/2020 with EF 45-50%. -Echocardiogram on 09/18/2019 shows EF 40-45%. -He is on Coreg 6.25 mg twice daily, Imdur 30 mg daily. Lasix 40 mg is an expired medication list.   PLAN:  1.Double hitlarge B celllymphoma: -He has completed 6 cycles of R-EPOCH. -We reviewed results and images of the PET scan from 01/07/2021.  Left upper quadrant lesion posterior to the stomach measures slightly smaller, at 3.2 x 3.1 cm compared to 4.5 x 3.5 cm previously.  This has SUV max of 4.4 compared to 4.5 previously.  Similar appearance of splenomegaly without hypermetabolism.  No new sites of suspicious hypermetabolic disease identified. -Options include consolidative radiation to the remaining area versus repeating PET scan in 2 months. -I have recommended follow-up with Dr. Lonia Blood at [facility] for further recommendations. -Reviewed labs from today which showed slightly elevated LDH of 212, unlikely significant given the findings on the PET scan. -RTC 1 month for follow-up.  2. Abdominal pain: -This is almost completely resolved.  He is rarely requiring oxycodone.  3. Nausea: -He will continue Zofran ODT every 8 hours as needed.  4.Neuropathy: -He has constant numbness in the fingertips which is stable.  We have cut back on vincristine dose for the last 2 cycles.  5. Hypokalemia: -Continue potassium 20 mEq daily.  Orders placed this encounter:  No orders of the defined types were placed in this encounter.    Derek Jack, MD [place] [phone]   I, Milinda Antis, am acting as a scribe for Dr. Sanda Linger.  I, Derek Jack MD, have reviewed the above documentation for accuracy and completeness, and I agree with the above.

## 2021-01-14 NOTE — Progress Notes (Signed)
Patients port flushed without difficulty.  Good blood return noted with no bruising or swelling noted at site.  Band aid applied.  VSS with discharge and left in satisfactory condition with no s/s of distress noted.   

## 2021-01-14 NOTE — Patient Instructions (Signed)
[place] at [facility] Discharge Instructions  You were seen today by Dr. Delton Coombes. He went over your recent results and scans. You will be referred to Dr. Lonia Blood in [facility] for follow-up. Dr. Delton Coombes will see you back in 3 to 4 weeks (after seeing Dr. Lonia Blood) for labs and follow up.   Thank you for choosing [place] at [facility] to provide your oncology and hematology care.  To afford each patient quality time with our provider, please arrive at least 15 minutes before your scheduled appointment time.   If you have a lab appointment with the [facility] please come in thru the Main Entrance and check in at the main information desk  You need to re-schedule your appointment should you arrive 10 or more minutes late.  We strive to give you quality time with our providers, and arriving late affects you and other patients whose appointments are after yours.  Also, if you no show three or more times for appointments you may be dismissed from the clinic at the providers discretion.     Again, thank you for choosing [facility].  Our hope is that these requests will decrease the amount of time that you wait before being seen by our physicians.       _____________________________________________________________  Should you have questions after your visit to [facility], please contact our office at [phone] between the hours of 8:00 a.m. and 4:30 p.m.  Voicemails left after 4:00 p.m. will not be returned until the following business day.  For prescription refill requests, have your pharmacy contact our office and allow 72 hours.    Cancer Center Support Programs:   > Cancer Support Group  2nd Tuesday of the month 1pm-2pm, Journey Room

## 2021-01-27 ENCOUNTER — Ambulatory Visit (INDEPENDENT_AMBULATORY_CARE_PROVIDER_SITE_OTHER): Payer: 59 | Admitting: Family

## 2021-01-27 ENCOUNTER — Other Ambulatory Visit: Payer: Self-pay

## 2021-01-27 ENCOUNTER — Encounter: Payer: Self-pay | Admitting: Family

## 2021-01-27 VITALS — Ht 73.0 in | Wt 174.0 lb

## 2021-01-27 DIAGNOSIS — Z113 Encounter for screening for infections with a predominantly sexual mode of transmission: Secondary | ICD-10-CM

## 2021-01-27 DIAGNOSIS — Z0000 Encounter for general adult medical examination without abnormal findings: Secondary | ICD-10-CM

## 2021-01-27 DIAGNOSIS — B20 Human immunodeficiency virus [HIV] disease: Secondary | ICD-10-CM

## 2021-01-27 DIAGNOSIS — Z79899 Other long term (current) drug therapy: Secondary | ICD-10-CM

## 2021-01-27 DIAGNOSIS — C8510 Unspecified B-cell lymphoma, unspecified site: Secondary | ICD-10-CM

## 2021-01-27 NOTE — Progress Notes (Signed)
Brief Narrative  Patient ID: Richard Cox, male    DOB: [birth year], 40 y.o.   MRN: [id number]  Richard Cox is an AA male diagnosed with HIV in May of 2020 with risk factor for acquiring HIV including MSM. No history of opportunistic infection or acute retroviral syndrome. Initial blood work for entry to care with CD4 of 188 and viral load of 65,200 (CDC Stage 3). Genotype was wild with no significant resistance.   Subjective:    Chief Complaint  Patient presents with  . HIV Positive/AIDS     HPI:  Richard Cox is a 40 y.o. male with HIV/AIDS last seen on 08/20/2020 with well-controlled HIV and good adherence and tolerance to his ART regimen of Biktarvy.  Viral load at the time was undetectable with CD4 count of 153.  He recently has completed chemotherapy and is being evaluated for the potential of radiation treatment for his high-grade B-cell lymphoma.  Here today for routine follow-up.  Richard Cox continues to take his Biktarvy daily as prescribed with no adverse side effects or missed doses since his last office visit.  He has not been taking Bactrim recently.  Overall feeling well today with no new concerns/complaints. Denies fevers, chills, night sweats, headaches, changes in vision, neck pain/stiffness, nausea, diarrhea, vomiting, lesions or rashes.  Awaiting appointment with [facility] healthcare to determine any additional need for treatment of lymphoma.  Richard Cox has no problems obtaining his medication from the pharmacy remains covered through [place].  Denies feelings of being down, depressed, or hopeless recently.  No recreational or illicit drug use or alcohol consumption.  He does smoke tobacco on occasion.  Condoms provided.  Due for routine dental care.  Declines vaccines.     Allergies  Allergen Reactions  . Neosporin [Neomycin-Bacitracin Zn-Polymyx] Rash      Outpatient Medications Prior to Visit  Medication Sig Dispense Refill  . acetaminophen (TYLENOL) 500 MG tablet Take 1,000 mg  by mouth every 6 (six) hours as needed for moderate pain or headache.    . allopurinol (ZYLOPRIM) 300 MG tablet Take 1 tablet (300 mg total) by mouth daily. 30 tablet 3  . bictegravir-emtricitabine-tenofovir AF (BIKTARVY) 50-200-25 MG TABS tablet Take 1 tablet by mouth daily. 30 tablet 5  . carvedilol (COREG) 6.25 MG tablet Take 1 tablet (6.25 mg total) by mouth 2 (two) times daily with a meal. For BP and Heart (Patient taking differently: Take 6.25 mg by mouth 2 (two) times daily with a meal.) 60 tablet 11  . dicyclomine (BENTYL) 10 MG capsule     . docusate sodium (COLACE) 100 MG capsule Take 100 mg by mouth daily as needed for mild constipation.    . hydrALAZINE (APRESOLINE) 50 MG tablet Take 50 mg by mouth 3 (three) times daily.    . isosorbide mononitrate (IMDUR) 30 MG 24 hr tablet Take 30 mg by mouth daily.    Marland Kitchen lidocaine-prilocaine (EMLA) cream Apply 1 application topically as needed. 30 g 0  . losartan (COZAAR) 50 MG tablet Take 1 tablet (50 mg total) by mouth daily. 90 tablet 3  . ondansetron (ZOFRAN-ODT) 4 MG disintegrating tablet     . oxyCODONE (OXY IR/ROXICODONE) 5 MG immediate release tablet Take 1 tablet (5 mg total) by mouth every 12 (twelve) hours as needed for severe pain. 60 tablet 0  . potassium chloride (KLOR-CON) 20 MEQ tablet Take 0.5 tablets (10 mEq total) by mouth daily. 30 tablet 1  . prochlorperazine (COMPAZINE) 10 MG tablet Take 1  tablet (10 mg total) by mouth every 6 (six) hours as needed for nausea or vomiting. 30 tablet 2  . sulfamethoxazole-trimethoprim (BACTRIM DS) 800-160 MG tablet Take 1 tablet by mouth daily. 30 tablet 5  . furosemide (LASIX) 40 MG tablet Take 1 tablet (40 mg total) by mouth daily. 30 tablet 3   Facility-Administered Medications Prior to Visit  Medication Dose Route Frequency Provider Last Rate Last Admin  . 0.9 %  sodium chloride infusion   Intravenous Continuous Derek Jack, MD      . 0.9 %  sodium chloride infusion   Intravenous  Continuous Derek Jack, MD   Stopped at 09/26/20 1124  . 0.9 %  sodium chloride infusion   Intravenous Continuous Derek Jack, MD 20 mL/hr at 11/04/20 0934 New Bag at 11/04/20 0934  . 0.9 %  sodium chloride infusion   Intravenous Continuous Derek Jack, MD 500 mL/hr at 11/04/20 0935 Bolus from Bag at 11/04/20 0935  . 0.9 %  sodium chloride infusion   Intravenous Continuous Derek Jack, MD   Stopped at 11/07/20 1110  . 0.9 %  sodium chloride infusion   Intravenous Continuous Derek Jack, MD   Stopped at 12/25/20 1117  . 0.9 %  sodium chloride infusion   Intravenous Continuous Derek Jack, MD      . sodium chloride flush (NS) 0.9 % injection 10 mL  10 mL Intravenous PRN Derek Jack, MD   10 mL at 10/15/20 1035  . sodium chloride flush (NS) 0.9 % injection 10 mL  10 mL Intracatheter PRN Derek Jack, MD   10 mL at 11/04/20 0800  . sodium chloride flush (NS) 0.9 % injection 10 mL  10 mL Intravenous PRN Derek Jack, MD   10 mL at 11/07/20 1046     Past Medical History:  Diagnosis Date  . CHF (congestive heart failure) ([place])   . Essential hypertension   . HIV infection ([place])   . Noncompliance with medication regimen   . 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 IR IMAGING GUIDED PORT INSERTION  08/23/2020   Right  . NO PAST SURGERIES         Review of Systems  Constitutional: Negative for appetite change, chills, fatigue, fever and unexpected weight change.  Eyes: Negative for visual disturbance.  Respiratory: Negative for cough, chest tightness, shortness of breath and wheezing.   Cardiovascular: Negative for chest pain and leg swelling.  Gastrointestinal: Negative for abdominal pain, constipation, diarrhea, nausea and vomiting.  Genitourinary: Negative for dysuria, flank pain, frequency, genital sores, hematuria and urgency.  Skin: Negative for rash.   Allergic/Immunologic: Negative for immunocompromised state.  Neurological: Negative for dizziness and headaches.      Objective:    Ht '6\' 1"'  (1.854 m)   Wt 174 lb (78.9 kg)   BMI 22.96 kg/m  Nursing note and vital signs reviewed.  Physical Exam Constitutional:      General: He is not in acute distress.    Appearance: He is well-developed.  Eyes:     Conjunctiva/sclera: Conjunctivae normal.  Cardiovascular:     Rate and Rhythm: Normal rate and regular rhythm.     Heart sounds: Normal heart sounds. No murmur heard. No friction rub. No gallop.   Pulmonary:     Effort: Pulmonary effort is normal. No respiratory distress.     Breath sounds: Normal breath sounds. No wheezing or rales.  Chest:     Chest wall:  No tenderness.  Abdominal:     General: Bowel sounds are normal.     Palpations: Abdomen is soft.     Tenderness: There is no abdominal tenderness.  Musculoskeletal:     Cervical back: Neck supple.  Lymphadenopathy:     Cervical: No cervical adenopathy.  Skin:    General: Skin is warm and dry.     Findings: No rash.  Neurological:     Mental Status: He is alert and oriented to person, place, and time.  Psychiatric:        Behavior: Behavior normal.        Thought Content: Thought content normal.        Judgment: Judgment normal.      Depression screen [facility] 2/9 01/27/2021 09/10/2020 08/20/2020 07/30/2020 10/10/2019  Decreased Interest 0 - 0 0 0  Down, Depressed, Hopeless 0 1 0 0 1  PHQ - 2 Score 0 1 0 0 1  Altered sleeping - 0 - - -  Tired, decreased energy - 0 - - -  Change in appetite - 0 - - -  Feeling bad or failure about yourself  - 0 - - -  Trouble concentrating - 0 - - -  Moving slowly or fidgety/restless - 0 - - -  Suicidal thoughts - 0 - - -  PHQ-9 Score - 1 - - -  Difficult doing work/chores - Somewhat difficult - - -       Assessment & Plan:    Patient Active Problem List   Diagnosis Date Noted  . High grade B-cell lymphoma ([place]) 08/21/2020  .  Encounter to establish care 07/30/2020  . Intra-abdominal lymphadenopathy 07/30/2020  . Generalized lymphadenopathy 07/25/2020  . Varicella zoster 01/04/2020  . AIDS (acquired immune deficiency syndrome) ([place]) 10/10/2019  . Healthcare maintenance 10/10/2019  . Chronic combined systolic and diastolic CHF (congestive heart failure) ([place]) 09/18/2019  . HIV (human immunodeficiency virus infection) ([place]) 09/18/2019  . Polysubstance abuse -cocaine use in remission 09/18/2019  . Alcohol abuse 09/18/2019  . Tobacco abuse 09/18/2019  . Nonspecific serologic evidence of human immunodeficiency virus (HIV) 03/13/2019  . Acute exacerbation of CHF (congestive heart failure) ([place]) 03/10/2019  . HTN (hypertension) 03/10/2019  . Congestive heart failure ([place]) 03/10/2019  . SOB (shortness of breath) 03/10/2019  . Noncompliance with medication regimen 03/10/2019     Problem List Items Addressed This Visit      Other   AIDS (acquired immune deficiency syndrome) ([place]) - Primary    Mr. Ramus continues to have well-controlled HIV/AIDS with good adherence and tolerance to his ART regimen of Biktarvy.  Given his CD4 count is above 100 and viral load is undetectable we will continue to monitor off Bactrim at the present time.  If additional chemotherapy and/or radiation is pursued for his lymphoma it would be advised to restart taking Bactrim for OI prophylaxis. Plan for follow up in 4 months or sooner if needed with lab work on the same day.       Relevant Orders   HIV-1 RNA quant-no reflex-bld   T-helper cell (CD4)- ([facility] clinic only)   Comp Met ([facility])   Healthcare maintenance     Discussed importance of safe sexual practice to reduce risk of STI.  Condoms provided.  Declines vaccines.  Due for routine dental care with paperwork completed for referral to [facility] dental cli[facility]      High grade B-cell lymphoma ([place])    Recently completed chemotherapy and currently in surveillance stage and  awaiting  follow-up with [facility] to determine any additional needs for chemotherapy/radiation.  Continue management per oncology.       Other Visit Diagnoses    Screening for STDs (sexually transmitted diseases)       Relevant Orders   RPR   Pharmacologic therapy       Relevant Orders   Lipid Profile       I am having Elray Dains maintain his carvedilol, hydrALAZINE, isosorbide mononitrate, losartan, furosemide, docusate sodium, sulfamethoxazole-trimethoprim, acetaminophen, lidocaine-prilocaine, ondansetron, dicyclomine, potassium chloride SA, allopurinol, Biktarvy, prochlorperazine, and oxyCODONE.    Follow-up: Return in about 4 months (around 05/29/2021), or if symptoms worsen or fail to improve.   Terri Piedra, MSN, FNP-C Nurse Practitioner [facility] [place] number: [phone]

## 2021-01-27 NOTE — Assessment & Plan Note (Signed)
Recently completed chemotherapy and currently in surveillance stage and awaiting follow-up with [facility] to determine any additional needs for chemotherapy/radiation.  Continue management per oncology.

## 2021-01-27 NOTE — Patient Instructions (Signed)
Nice to see you.  We will check your lab work today.  Continue to take your [place] daily as prescribed.  Refills are at the pharmacy.  Let us know if you are going to have any additional treatments (chemo/radiation).  Plan for follow up in 4 months or sooner if needed with lab work 1-2 weeks prior to appointment.   Have a great day and stay safe!

## 2021-01-27 NOTE — Assessment & Plan Note (Signed)
   Discussed importance of safe sexual practice to reduce risk of STI.  Condoms provided.  Declines vaccines.  Due for routine dental care with paperwork completed for referral to [place]

## 2021-01-27 NOTE — Assessment & Plan Note (Signed)
Richard Cox continues to have well-controlled HIV/AIDS with good adherence and tolerance to his ART regimen of Biktarvy.  Given his CD4 count is above 100 and viral load is undetectable we will continue to monitor off Bactrim at the present time.  If additional chemotherapy and/or radiation is pursued for his lymphoma it would be advised to restart taking Bactrim for OI prophylaxis. Plan for follow up in 4 months or sooner if needed with lab work on the same day.

## 2021-01-28 LAB — T-HELPER CELL (CD4) - (RCID CLINIC ONLY)
CD4 % Helper T Cell: 27 % — ABNORMAL LOW (ref 33–65)
CD4 T Cell Abs: 170 /uL — ABNORMAL LOW (ref 400–1790)

## 2021-01-29 LAB — COMPREHENSIVE METABOLIC PANEL
AG Ratio: 1.6 (calc) (ref 1.0–2.5)
ALT: 11 U/L (ref 9–46)
AST: 17 U/L (ref 10–40)
Albumin: 4.2 g/dL (ref 3.6–5.1)
Alkaline phosphatase (APISO): 65 U/L (ref 36–130)
BUN: 17 mg/dL (ref 7–25)
CO2: 22 mmol/L (ref 20–32)
Calcium: 8.9 mg/dL (ref 8.6–10.3)
Chloride: 108 mmol/L (ref 98–110)
Creat: 0.91 mg/dL (ref 0.60–1.35)
Globulin: 2.6 g/dL (calc) (ref 1.9–3.7)
Glucose, Bld: 91 mg/dL (ref 65–99)
Potassium: 4 mmol/L (ref 3.5–5.3)
Sodium: 140 mmol/L (ref 135–146)
Total Bilirubin: 0.4 mg/dL (ref 0.2–1.2)
Total Protein: 6.8 g/dL (ref 6.1–8.1)

## 2021-01-29 LAB — LIPID PANEL
Cholesterol: 198 mg/dL (ref <200)
HDL: 30 mg/dL — ABNORMAL LOW (ref >=40)
LDL Cholesterol (Calc): 132 mg/dL (calc) — ABNORMAL HIGH
Non-HDL Cholesterol (Calc): 168 mg/dL (calc) — ABNORMAL HIGH (ref <130)
Total CHOL/HDL Ratio: 6.6 (calc) — ABNORMAL HIGH (ref <5.0)
Triglycerides: 218 mg/dL — ABNORMAL HIGH (ref <150)

## 2021-01-29 LAB — HIV-1 RNA QUANT-NO REFLEX-BLD
HIV 1 RNA Quant: <20 Copies/mL — ABNORMAL HIGH
HIV-1 RNA Quant, Log: <1.3 Log cps/mL — ABNORMAL HIGH

## 2021-01-29 LAB — RPR: RPR Ser Ql: NONREACTIVE

## 2021-02-04 ENCOUNTER — Other Ambulatory Visit (HOSPITAL_COMMUNITY): Payer: Self-pay

## 2021-02-07 ENCOUNTER — Other Ambulatory Visit (HOSPITAL_COMMUNITY): Payer: Self-pay

## 2021-02-07 DIAGNOSIS — C8510 Unspecified B-cell lymphoma, unspecified site: Secondary | ICD-10-CM

## 2021-02-10 ENCOUNTER — Other Ambulatory Visit (HOSPITAL_COMMUNITY): Payer: Self-pay | Admitting: *Deleted

## 2021-02-10 DIAGNOSIS — C8510 Unspecified B-cell lymphoma, unspecified site: Secondary | ICD-10-CM

## 2021-02-10 MED ORDER — OXYCODONE HCL 5 MG PO TABS: 5.0000 mg | ORAL_TABLET | Freq: Two times a day (BID) | ORAL | 0 refills | Status: DC | PRN

## 2021-02-13 ENCOUNTER — Other Ambulatory Visit (HOSPITAL_COMMUNITY): Payer: Self-pay

## 2021-02-25 ENCOUNTER — Inpatient Hospital Stay (HOSPITAL_COMMUNITY): Payer: 59 | Attending: Hematology | Admitting: Hematology

## 2021-02-25 ENCOUNTER — Other Ambulatory Visit: Payer: Self-pay

## 2021-02-25 ENCOUNTER — Other Ambulatory Visit (HOSPITAL_COMMUNITY): Payer: 59

## 2021-02-25 VITALS — BP 168/102 | HR 86 | Temp 98.5°F | Resp 17 | Wt 178.0 lb

## 2021-02-25 DIAGNOSIS — C8510 Unspecified B-cell lymphoma, unspecified site: Secondary | ICD-10-CM

## 2021-02-25 DIAGNOSIS — I509 Heart failure, unspecified: Secondary | ICD-10-CM | POA: Diagnosis not present

## 2021-02-25 DIAGNOSIS — I5042 Chronic combined systolic (congestive) and diastolic (congestive) heart failure: Secondary | ICD-10-CM | POA: Diagnosis not present

## 2021-02-25 DIAGNOSIS — F17210 Nicotine dependence, cigarettes, uncomplicated: Secondary | ICD-10-CM | POA: Insufficient documentation

## 2021-02-25 DIAGNOSIS — B20 Human immunodeficiency virus [HIV] disease: Secondary | ICD-10-CM | POA: Insufficient documentation

## 2021-02-25 DIAGNOSIS — I110 Hypertensive heart disease with heart failure: Secondary | ICD-10-CM | POA: Diagnosis not present

## 2021-02-25 DIAGNOSIS — E876 Hypokalemia: Secondary | ICD-10-CM | POA: Insufficient documentation

## 2021-02-25 MED ORDER — HYDRALAZINE HCL 50 MG PO TABS: 50.0000 mg | ORAL_TABLET | Freq: Three times a day (TID) | ORAL | 1 refills | Status: DC

## 2021-02-25 MED ORDER — CARVEDILOL 6.25 MG PO TABS: 6.2500 mg | ORAL_TABLET | Freq: Two times a day (BID) | ORAL | 1 refills | Status: DC

## 2021-02-25 MED ORDER — LOSARTAN POTASSIUM 50 MG PO TABS: 50.0000 mg | ORAL_TABLET | Freq: Every day | ORAL | 1 refills | Status: DC

## 2021-02-25 NOTE — Progress Notes (Signed)
[facility] [place], [place] [address]   CLINIC:  Medical Oncology/Hematology  PCP:  Richard Spar, MD [address] / [place] [address]  [phone]  REASON FOR VISIT:  Follow-up for high-grade B-cell lymphoma  PRIOR THERAPY: R-EPOCH x 6 cycles from 09/02/2020 to 12/27/2020  CURRENT THERAPY: Surveillance  INTERVAL HISTORY:  [name], a 40 y.o. male, returns for routine follow-up for his high-grade B-cell lymphoma. Richard Cox was last seen on 01/14/2021. He saw Dr. Harrell Gave Cox at [facility] on 02/21/2021.  Today he reports feeling okay. He reports having an episode of nausea and vomiting last week while he was sitting in his car and listening to music; he attributes this to his allergies. He also reports that 2 weeks ago he had an episode of lower abdominal pain, similar to the pain he had initially which brought him to the hospital for his lymphoma. His appetite is excellent.   REVIEW OF SYSTEMS:  Review of Systems  Constitutional: Positive for fatigue (75%). Negative for appetite change.  Gastrointestinal: Positive for abdominal pain (x1 episode lower abdominal pain), nausea (x1 episode) and vomiting (x1 episode).  All other systems reviewed and are negative.   PAST MEDICAL/SURGICAL HISTORY:  Past Medical History:  Diagnosis Date  . CHF (congestive heart failure) ([place])   . Essential hypertension   . HIV infection ([place])   . Noncompliance with medication regimen   . 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 IR IMAGING GUIDED PORT INSERTION  08/23/2020   Right  . NO PAST SURGERIES      SOCIAL HISTORY:  Social History   Socioeconomic History  . Marital status: Single    Spouse name: Not on file  . Number of children: 0  . Years of education: 70  . Highest education level: Not on file  Occupational History  . Not on file  Tobacco Use  . Smoking status: Light Tobacco Smoker     Packs/day: 0.25    Types: Cigarettes  . Smokeless tobacco: Never Used  Vaping Use  . Vaping Use: Never used  Substance and Sexual Activity  . Alcohol use: Not Currently  . Drug use: Not Currently    Types: Marijuana    Comment: x 1 week  . Sexual activity: Not Currently    Comment: pt given condoms  Other Topics Concern  . Not on file  Social History Narrative  . Not on file   Social Determinants of Health   Financial Resource Strain: Low Risk   . Difficulty of Paying Living Expenses: Not very hard  Food Insecurity: No Food Insecurity  . Worried About Charity fundraiser in the Last Year: Never true  . Ran Out of Food in the Last Year: Never true  Transportation Needs: No Transportation Needs  . Lack of Transportation (Medical): No  . Lack of Transportation (Non-Medical): No  Physical Activity: Insufficiently Active  . Days of Exercise per Week: 5 days  . Minutes of Exercise per Session: 20 min  Stress: No Stress Concern Present  . Feeling of Stress : Only a little  Social Connections: Socially Isolated  . Frequency of Communication with Friends and Family: More than three times a week  . Frequency of Social Gatherings with Friends and Family: Never  . Attends Religious Services: Never  . Active Member of Clubs or Organizations: No  . Attends Archivist Meetings: Not on file  .  Marital Status: Never married  Intimate Partner Violence: Not At Risk  . Fear of Current or Ex-Partner: No  . Emotionally Abused: No  . Physically Abused: No  . Sexually Abused: No    FAMILY HISTORY:  Family History  Problem Relation Age of Onset  . Diabetes Mellitus II Mother   . Cancer Mother        multiple myeloma  . Cancer Father        multiple myeloma  . Congestive Heart Failure Brother   . Heart Problems Brother   . Lupus Brother     CURRENT MEDICATIONS:  Current Outpatient Medications  Medication Sig Dispense Refill  . acetaminophen (TYLENOL) 500 MG tablet Take  1,000 mg by mouth every 6 (six) hours as needed for moderate pain or headache.    . allopurinol (ZYLOPRIM) 300 MG tablet Take 1 tablet (300 mg total) by mouth daily. 30 tablet 3  . bictegravir-emtricitabine-tenofovir AF (BIKTARVY) 50-200-25 MG TABS tablet Take 1 tablet by mouth daily. 30 tablet 5  . dicyclomine (BENTYL) 10 MG capsule     . docusate sodium (COLACE) 100 MG capsule Take 100 mg by mouth daily as needed for mild constipation.    . isosorbide mononitrate (IMDUR) 30 MG 24 hr tablet Take 30 mg by mouth daily.    [place] Kitchen lidocaine-prilocaine (EMLA) cream Apply 1 application topically as needed. 30 g 0  . ondansetron (ZOFRAN-ODT) 4 MG disintegrating tablet     . oxyCODONE (OXY IR/ROXICODONE) 5 MG immediate release tablet Take 1 tablet (5 mg total) by mouth every 12 (twelve) hours as needed for severe pain. 60 tablet 0  . potassium chloride (KLOR-CON) 20 MEQ tablet Take 0.5 tablets (10 mEq total) by mouth daily. 30 tablet 1  . prochlorperazine (COMPAZINE) 10 MG tablet Take 1 tablet (10 mg total) by mouth every 6 (six) hours as needed for nausea or vomiting. 30 tablet 2  . sulfamethoxazole-trimethoprim (BACTRIM DS) 800-160 MG tablet TAKE 1 TABLET BY MOUTH DAILY. 30 tablet 5  . carvedilol (COREG) 6.25 MG tablet Take 1 tablet (6.25 mg total) by mouth 2 (two) times daily with a meal. 60 tablet 1  . furosemide (LASIX) 40 MG tablet Take 1 tablet (40 mg total) by mouth daily. 30 tablet 3  . hydrALAZINE (APRESOLINE) 50 MG tablet Take 1 tablet (50 mg total) by mouth 3 (three) times daily. 90 tablet 1  . losartan (COZAAR) 50 MG tablet Take 1 tablet (50 mg total) by mouth daily. 90 tablet 1   No current facility-administered medications for this visit.   Facility-Administered Medications Ordered in Other Visits  Medication Dose Route Frequency Provider Last Rate Last Admin  . 0.9 %  sodium chloride infusion   Intravenous Continuous Derek Jack, MD      . 0.9 %  sodium chloride infusion    Intravenous Continuous Derek Jack, MD   Stopped at 09/26/20 1124  . 0.9 %  sodium chloride infusion   Intravenous Continuous Derek Jack, MD 20 mL/hr at 11/04/20 0934 New Bag at 11/04/20 0934  . 0.9 %  sodium chloride infusion   Intravenous Continuous Derek Jack, MD 500 mL/hr at 11/04/20 0935 Bolus from Bag at 11/04/20 0935  . 0.9 %  sodium chloride infusion   Intravenous Continuous Derek Jack, MD   Stopped at 11/07/20 1110  . 0.9 %  sodium chloride infusion   Intravenous Continuous Derek Jack, MD   Stopped at 12/25/20 1117  . 0.9 %  sodium chloride infusion  Intravenous Continuous Derek Jack, MD      . sodium chloride flush (NS) 0.9 % injection 10 mL  10 mL Intravenous PRN Derek Jack, MD   10 mL at 10/15/20 1035  . sodium chloride flush (NS) 0.9 % injection 10 mL  10 mL Intracatheter PRN Derek Jack, MD   10 mL at 11/04/20 0800  . sodium chloride flush (NS) 0.9 % injection 10 mL  10 mL Intravenous PRN Derek Jack, MD   10 mL at 11/07/20 1046    ALLERGIES:  Allergies  Allergen Reactions  . Neosporin [Neomycin-Bacitracin Zn-Polymyx] Rash    PHYSICAL EXAM:  Performance status (ECOG): 0 - Asymptomatic  Vitals:   02/25/21 1301  BP: (!) 168/102  Pulse: 86  Resp: 17  Temp: 98.5 F (36.9 C)  SpO2: 95%   Wt Readings from Last 3 Encounters:  02/25/21 178 lb (80.7 kg)  01/27/21 174 lb (78.9 kg)  01/14/21 170 lb 3.1 oz (77.2 kg)   Physical Exam Vitals reviewed.  Constitutional:      Appearance: Normal appearance.  Cardiovascular:     Rate and Rhythm: Normal rate and regular rhythm.     Pulses: Normal pulses.     Heart sounds: Normal heart sounds.  Pulmonary:     Effort: Pulmonary effort is normal.     Breath sounds: Normal breath sounds.  Chest:  Breasts:     Right: No axillary adenopathy.     Left: No axillary adenopathy.    Abdominal:     Palpations: Abdomen is soft. There is no  hepatomegaly, splenomegaly or mass.     Tenderness: There is no abdominal tenderness.  Lymphadenopathy:     Cervical: No cervical adenopathy.     Upper Body:     Right upper body: No axillary or pectoral adenopathy.     Left upper body: No axillary or pectoral adenopathy.  Neurological:     General: No focal deficit present.     Mental Status: He is alert and oriented to person, place, and time.  Psychiatric:        Mood and Affect: Mood normal.        Behavior: Behavior normal.     LABORATORY DATA:  I have reviewed the labs as listed.  CBC Latest Ref Rng & Units 01/14/2021 12/23/2020 12/02/2020  WBC 4.0 - 10.5 K/uL 5.7 9.6 4.6  Hemoglobin 13.0 - 17.0 g/dL 11.0(L) 11.8(L) 10.6(L)  Hematocrit 39.0 - 52.0 % 34.3(L) 36.4(L) 32.6(L)  Platelets 150 - 400 K/uL 132(L) 126(L) 114(L)   CMP Latest Ref Rng & Units 01/27/2021 01/14/2021 12/23/2020  Glucose 65 - 99 mg/dL 91 96 131(H)  BUN 7 - 25 mg/dL _0 Creatinine 0.60 - 1.35 mg/dL 0.91 1.16 1.11  Sodium 135 - 146 mmol/L 140 138 139  Potassium 3.5 - 5.3 mmol/L 4.0 4.4 3.9  Chloride 98 - 110 mmol/L 108 108 106  CO2 20 - 32 mmol/L _1 Calcium 8.6 - 10.3 mg/dL 8.9 9.2 9.4  Total Protein 6.1 - 8.1 g/dL 6.8 7.0 6.8  Total Bilirubin 0.2 - 1.2 mg/dL 0.4 0.4 0.5  Alkaline Phos 38 - 126 U/L - 67 73  AST 10 - 40 U/L _2 ALT 9 - 46 U/L _3 Component Value Date/Time   RBC 3.46 (L) 01/14/2021 1024   MCV 99.1 01/14/2021 1024   MCH 31.8 01/14/2021 1024   MCHC 32.1 01/14/2021 1024  RDW 16.6 (H) 01/14/2021 1024   LYMPHSABS 0.9 01/14/2021 1024   MONOABS 0.7 01/14/2021 1024   EOSABS 0.0 01/14/2021 1024   BASOSABS 0.0 01/14/2021 1024    DIAGNOSTIC IMAGING:  I have independently reviewed the scans and discussed with the patient. No results found.   ASSESSMENT:  1.B-cell high-grade lymphoma: -Presentation to the ER with left upper quadrant and mid abdominal pain for 2 weeks. -10 pound weight loss in the last 2 weeks  due to decreased appetite. Denies any fevers or night sweats. -CT AP with contrast on 07/24/2020 shows 13.8 11.2 cm mass in the epigastric region, lobulated large mass in the mesenteric region, 9.3 x 5.1 cm mass in the inferior pelvis, 9.7 x 4.1 cm mass in the superior pelvis. Epigastric mass seems to abut posterior spleen. -LDH was elevated at 309, uric acid 10.2. -PET scan on 07/31/2020 showed extensive multifocal soft tissue masses within the abdomen and pelvis, most of them between SUV 15 and 20. FDG avid subpleural nodules overlying the right lung. Splenomegaly. Multiple scattered lung nodules, nonspecific. -Biopsy of the soft tissue mass in the upper abdomen on 08/15/2020 consistent with high-grade B-cell lymphoma. IHC positive for CD20, CD10, BCL6, negative for BCL-2, CD3, CD5, CD30, mum 1 and EBV. Ki-67 is 80-90%. 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 -He was evaluated by Dr. Eldridge [facility] and was not felt to be a candidate for clinical trial. -6 cycles of R-EPOCH from 08/28/2020 through 12/23/2020. -PET scan on 10/28/2020 showed marked interval response with most of the sites of hypermetabolic disease with no residual hypermetabolism. One of the more dominant foci of disease in the left upper quadrant of the abdomen does show low-level of hypermetabolism compatible with [place] 4. No new hypermetabolic disease. Persistent splenomegaly without hypermetabolism in the parenchyma. - PET scan on 01/07/2021 Left upper quadrant lesion posterior to the stomach measures slightly smaller, at 3.2 x 3.1 cm compared to 4.5 x 3.5 cm previously.  This has SUV max of 4.4 compared to 4.5 previously.  Similar appearance of splenomegaly without hypermetabolism.  No new sites of suspicious hypermetabolic disease identified.   2. Social/family history: -He quit smoking last year. -Family history significant for mother and father  who had multiple myeloma. Grandfather had colon cancer.  3. 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  4. CHF: -Echocardiogram on 09/16/2020 with EF 50%. -Echocardiogram on 08/22/2020 with EF 45-50%. -Echocardiogram on 09/18/2019 shows EF 40-45%. -He is on Coreg 6.25 mg twice daily, Imdur 30 mg daily. Lasix 40 mg is an expired medication list.   PLAN:  1.Double hitlarge B celllymphoma: -He was evaluated by Dr. Tana [facility] at [facility]. - We will plan to repeat his PET scan in the first or second week of May. - Reviewed his labs from [facility].  Mildly elevated LDH. - Physical examination today did not reveal any palpable masses.  He reported 1 episode of nausea and vomiting while sitting in the car.  He also had 1 episode of pain in the lower abdomen 1 week prior to that nausea episode. - We will see him back after the PET scan.  2. Abdominal pain: -He is rarely requiring oxycodone.  3. Nausea: -Continue Zofran as needed.  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  5. Hypokalemia: -Continue potassium 20 mEq daily.  6.  Hypertension: - He ran out of blood pressure  medications.  His blood pressure today is high. - I have renewed his losartan, hydralazine and Coreg.  Orders placed this encounter:  No orders of the defined types were placed in this encounter.    Derek Jack, MD [place] [phone]   I, Milinda Antis, am acting as a scribe for Dr. Sanda Linger.  I, Derek Jack MD, have reviewed the above documentation for accuracy and completeness, and I agree with the above.

## 2021-02-25 NOTE — Patient Instructions (Addendum)
[place] at [facility] Discharge Instructions  You were seen today by Dr. Delton Coombes. He went over your recent results. You will be scheduled to have a PET scan on May 9th. Dr. Delton Coombes will see you back after the PET scan for labs and follow up.   Thank you for choosing [place] at [facility] to provide your oncology and hematology care.  To afford each patient quality time with our provider, please arrive at least 15 minutes before your scheduled appointment time.   If you have a lab appointment with the [facility] please come in thru the Main Entrance and check in at the main information desk  You need to re-schedule your appointment should you arrive 10 or more minutes late.  We strive to give you quality time with our providers, and arriving late affects you and other patients whose appointments are after yours.  Also, if you no show three or more times for appointments you may be dismissed from the clinic at the providers discretion.     Again, thank you for choosing [facility].  Our hope is that these requests will decrease the amount of time that you wait before being seen by our physicians.       _____________________________________________________________  Should you have questions after your visit to [facility], please contact our office at [phone] between the hours of 8:00 a.m. and 4:30 p.m.  Voicemails left after 4:00 p.m. will not be returned until the following business day.  For prescription refill requests, have your pharmacy contact our office and allow 72 hours.    Cancer Center Support Programs:   > Cancer Support Group  2nd Tuesday of the month 1pm-2pm, Journey Room

## 2021-03-07 ENCOUNTER — Other Ambulatory Visit (HOSPITAL_COMMUNITY): Payer: Self-pay

## 2021-03-07 DIAGNOSIS — C8510 Unspecified B-cell lymphoma, unspecified site: Secondary | ICD-10-CM

## 2021-03-07 MED ORDER — OXYCODONE HCL 5 MG PO TABS: 5.0000 mg | ORAL_TABLET | Freq: Two times a day (BID) | ORAL | 0 refills | Status: DC | PRN

## 2021-03-17 ENCOUNTER — Inpatient Hospital Stay (HOSPITAL_COMMUNITY): Payer: 59 | Attending: Hematology

## 2021-03-17 ENCOUNTER — Other Ambulatory Visit: Payer: Self-pay

## 2021-03-17 ENCOUNTER — Ambulatory Visit (HOSPITAL_COMMUNITY)
Admission: RE | Admit: 2021-03-17 | Discharge: 2021-03-17 | Disposition: A | Discharge status: Home / Self Care | Payer: 59 | Source: Ambulatory Visit | Attending: Hematology | Admitting: Hematology

## 2021-03-17 DIAGNOSIS — C8510 Unspecified B-cell lymphoma, unspecified site: Secondary | ICD-10-CM | POA: Insufficient documentation

## 2021-03-17 DIAGNOSIS — B20 Human immunodeficiency virus [HIV] disease: Secondary | ICD-10-CM | POA: Insufficient documentation

## 2021-03-17 DIAGNOSIS — I43 Cardiomyopathy in diseases classified elsewhere: Secondary | ICD-10-CM | POA: Insufficient documentation

## 2021-03-17 DIAGNOSIS — F17210 Nicotine dependence, cigarettes, uncomplicated: Secondary | ICD-10-CM | POA: Insufficient documentation

## 2021-03-17 DIAGNOSIS — Z87891 Personal history of nicotine dependence: Secondary | ICD-10-CM | POA: Insufficient documentation

## 2021-03-17 DIAGNOSIS — E876 Hypokalemia: Secondary | ICD-10-CM | POA: Insufficient documentation

## 2021-03-17 DIAGNOSIS — Z9114 Patient's other noncompliance with medication regimen: Secondary | ICD-10-CM | POA: Insufficient documentation

## 2021-03-17 DIAGNOSIS — I5042 Chronic combined systolic (congestive) and diastolic (congestive) heart failure: Secondary | ICD-10-CM

## 2021-03-17 DIAGNOSIS — R110 Nausea: Secondary | ICD-10-CM | POA: Insufficient documentation

## 2021-03-17 DIAGNOSIS — Z79899 Other long term (current) drug therapy: Secondary | ICD-10-CM | POA: Insufficient documentation

## 2021-03-17 DIAGNOSIS — I509 Heart failure, unspecified: Secondary | ICD-10-CM | POA: Insufficient documentation

## 2021-03-17 DIAGNOSIS — G893 Neoplasm related pain (acute) (chronic): Secondary | ICD-10-CM | POA: Insufficient documentation

## 2021-03-17 DIAGNOSIS — I110 Hypertensive heart disease with heart failure: Secondary | ICD-10-CM | POA: Insufficient documentation

## 2021-03-17 DIAGNOSIS — Z9221 Personal history of antineoplastic chemotherapy: Secondary | ICD-10-CM | POA: Insufficient documentation

## 2021-03-17 MED ORDER — FLUDEOXYGLUCOSE F - 18 (FDG) INJECTION
9.7200 | Freq: Once | INTRAVENOUS | Status: AC | PRN
  Administered 2021-03-17: 9.72 via INTRAVENOUS

## 2021-03-24 NOTE — Progress Notes (Shared)
Richard Cox, [place] [address]   CLINIC:  Medical Oncology/Hematology  PCP:  Richard Spar, MD [address] / [place] [address]  [phone]  REASON FOR VISIT:  Follow-up for high-grade B-cell lymphoma  PRIOR THERAPY: R-EPOCH x 6 cycles from 09/02/2020 to 12/27/2020  CURRENT THERAPY: surveillance  INTERVAL HISTORY:  Richard Cox, a 40 y.o. male, returns for routine follow-up for his high-grade B-cell lymphoma. Richard Cox was last seen on 02/25/2021.   REVIEW OF SYSTEMS:  Review of Systems  All other systems reviewed and are negative.   PAST MEDICAL/SURGICAL HISTORY:  Past Medical History:  Diagnosis Date  . CHF (congestive heart failure) ([place])   . Essential hypertension   . HIV infection ([place])   . Noncompliance with medication regimen   . 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 IR IMAGING GUIDED PORT INSERTION  08/23/2020   Right  . NO PAST SURGERIES      SOCIAL HISTORY:  Social History   Socioeconomic History  . Marital status: Single    Spouse name: Not on file  . Number of children: 0  . Years of education: 86  . Highest education level: Not on file  Occupational History  . Not on file  Tobacco Use  . Smoking status: Light Tobacco Smoker    Packs/day: 0.25    Types: Cigarettes  . Smokeless tobacco: Never Used  Vaping Use  . Vaping Use: Never used  Substance and Sexual Activity  . Alcohol use: Not Currently  . Drug use: Not Currently    Types: Marijuana    Comment: x 1 week  . Sexual activity: Not Currently    Comment: pt given condoms  Other Topics Concern  . Not on file  Social History Narrative  . Not on file   Social Determinants of Health   Financial Resource Strain: Low Risk   . Difficulty of Paying Living Expenses: Not very hard  Food Insecurity: No Food Insecurity  . Worried About Charity fundraiser in the Last Year: Never true  . Ran Out  of Food in the Last Year: Never true  Transportation Needs: No Transportation Needs  . Lack of Transportation (Medical): No  . Lack of Transportation (Non-Medical): No  Physical Activity: Insufficiently Active  . Days of Exercise per Week: 5 days  . Minutes of Exercise per Session: 20 min  Stress: No Stress Concern Present  . Feeling of Stress : Only a little  Social Connections: Socially Isolated  . Frequency of Communication with Friends and Family: More than three times a week  . Frequency of Social Gatherings with Friends and Family: Never  . Attends Religious Services: Never  . Active Member of Clubs or Organizations: No  . Attends Archivist Meetings: Not on file  . Marital Status: Never married  Intimate Partner Violence: Not At Risk  . Fear of Current or Ex-Partner: No  . Emotionally Abused: No  . Physically Abused: No  . Sexually Abused: No    FAMILY HISTORY:  Family History  Problem Relation Age of Onset  . Diabetes Mellitus II Mother   . Cancer Mother        multiple myeloma  . Cancer Father        multiple myeloma  . Congestive Heart Failure Brother   . Heart Problems Brother   . Lupus Brother     CURRENT MEDICATIONS:  Current Outpatient Medications  Medication Sig Dispense Refill  . acetaminophen (TYLENOL) 500 MG tablet Take 1,000 mg by mouth every 6 (six) hours as needed for moderate pain or headache.    . allopurinol (ZYLOPRIM) 300 MG tablet Take 1 tablet (300 mg total) by mouth daily. 30 tablet 3  . bictegravir-emtricitabine-tenofovir AF (BIKTARVY) 50-200-25 MG TABS tablet Take 1 tablet by mouth daily. 30 tablet 5  . carvedilol (COREG) 6.25 MG tablet Take 1 tablet (6.25 mg total) by mouth 2 (two) times daily with a meal. 60 tablet 1  . dicyclomine (BENTYL) 10 MG capsule     . docusate sodium (COLACE) 100 MG capsule Take 100 mg by mouth daily as needed for mild constipation.    . furosemide (LASIX) 40 MG tablet Take 1 tablet (40 mg total) by mouth  daily. 30 tablet 3  . hydrALAZINE (APRESOLINE) 50 MG tablet Take 1 tablet (50 mg total) by mouth 3 (three) times daily. 90 tablet 1  . isosorbide mononitrate (IMDUR) 30 MG 24 hr tablet Take 30 mg by mouth daily.    Marland Kitchen lidocaine-prilocaine (EMLA) cream Apply 1 application topically as needed. 30 g 0  . losartan (COZAAR) 50 MG tablet Take 1 tablet (50 mg total) by mouth daily. 90 tablet 1  . ondansetron (ZOFRAN-ODT) 4 MG disintegrating tablet     . oxyCODONE (OXY IR/ROXICODONE) 5 MG immediate release tablet Take 1 tablet (5 mg total) by mouth every 12 (twelve) hours as needed for severe pain. 60 tablet 0  . potassium chloride (KLOR-CON) 20 MEQ tablet Take 0.5 tablets (10 mEq total) by mouth daily. 30 tablet 1  . prochlorperazine (COMPAZINE) 10 MG tablet Take 1 tablet (10 mg total) by mouth every 6 (six) hours as needed for nausea or vomiting. 30 tablet 2  . sulfamethoxazole-trimethoprim (BACTRIM DS) 800-160 MG tablet TAKE 1 TABLET BY MOUTH DAILY. 30 tablet 5   No current facility-administered medications for this visit.   Facility-Administered Medications Ordered in Other Visits  Medication Dose Route Frequency Provider Last Rate Last Admin  . 0.9 %  sodium chloride infusion   Intravenous Continuous Derek Jack, MD      . 0.9 %  sodium chloride infusion   Intravenous Continuous Derek Jack, MD   Stopped at 09/26/20 1124  . 0.9 %  sodium chloride infusion   Intravenous Continuous Derek Jack, MD 20 mL/hr at 11/04/20 0934 New Bag at 11/04/20 0934  . 0.9 %  sodium chloride infusion   Intravenous Continuous Derek Jack, MD 500 mL/hr at 11/04/20 0935 Bolus from Bag at 11/04/20 0935  . 0.9 %  sodium chloride infusion   Intravenous Continuous Derek Jack, MD   Stopped at 11/07/20 1110  . 0.9 %  sodium chloride infusion   Intravenous Continuous Derek Jack, MD   Stopped at 12/25/20 1117  . 0.9 %  sodium chloride infusion   Intravenous Continuous  Derek Jack, MD      . sodium chloride flush (NS) 0.9 % injection 10 mL  10 mL Intravenous PRN Derek Jack, MD   10 mL at 10/15/20 1035  . sodium chloride flush (NS) 0.9 % injection 10 mL  10 mL Intracatheter PRN Derek Jack, MD   10 mL at 11/04/20 0800  . sodium chloride flush (NS) 0.9 % injection 10 mL  10 mL Intravenous PRN Derek Jack, MD   10 mL at 11/07/20 1046    ALLERGIES:  Allergies  Allergen Reactions  . Neosporin [Neomycin-Bacitracin Zn-Polymyx] Rash  PHYSICAL EXAM:  Performance status (ECOG): 0 - Asymptomatic  There were no vitals filed for this visit. Wt Readings from Last 3 Encounters:  02/25/21 178 lb (80.7 kg)  01/27/21 174 lb (78.9 kg)  01/14/21 170 lb 3.1 oz (77.2 kg)   Physical Exam Vitals reviewed.  Constitutional:      Appearance: Normal appearance.  Cardiovascular:     Rate and Rhythm: Normal rate and regular rhythm.     Pulses: Normal pulses.     Heart sounds: Normal heart sounds.  Pulmonary:     Effort: Pulmonary effort is normal.     Breath sounds: Normal breath sounds.  Neurological:     General: No focal deficit present.     Mental Status: He is alert and oriented to person, place, and time.  Psychiatric:        Mood and Affect: Mood normal.        Behavior: Behavior normal.     LABORATORY DATA:  I have reviewed the labs as listed.  CBC Latest Ref Rng & Units 01/14/2021 12/23/2020 12/02/2020  WBC 4.0 - 10.5 K/uL 5.7 9.6 4.6  Hemoglobin 13.0 - 17.0 g/dL 11.0(L) 11.8(L) 10.6(L)  Hematocrit 39.0 - 52.0 % 34.3(L) 36.4(L) 32.6(L)  Platelets 150 - 400 K/uL 132(L) 126(L) 114(L)   CMP Latest Ref Rng & Units 01/27/2021 01/14/2021 12/23/2020  Glucose 65 - 99 mg/dL 91 96 131(H)  BUN 7 - 25 mg/dL '17 18 18  ' Creatinine 0.60 - 1.35 mg/dL 0.91 1.16 1.11  Sodium 135 - 146 mmol/L 140 138 139  Potassium 3.5 - 5.3 mmol/L 4.0 4.4 3.9  Chloride 98 - 110 mmol/L 108 108 106  CO2 20 - 32 mmol/L '22 22 24  ' Calcium 8.6 - 10.3 mg/dL  8.9 9.2 9.4  Total Protein 6.1 - 8.1 g/dL 6.8 7.0 6.8  Total Bilirubin 0.2 - 1.2 mg/dL 0.4 0.4 0.5  Alkaline Phos 38 - 126 U/L - 67 73  AST 10 - 40 U/L '17 17 18  ' ALT 9 - 46 U/L '11 14 14      ' Component Value Date/Time   RBC 3.46 (L) 01/14/2021 1024   MCV 99.1 01/14/2021 1024   MCH 31.8 01/14/2021 1024   MCHC 32.1 01/14/2021 1024   RDW 16.6 (H) 01/14/2021 1024   LYMPHSABS 0.9 01/14/2021 1024   MONOABS 0.7 01/14/2021 1024   EOSABS 0.0 01/14/2021 1024   BASOSABS 0.0 01/14/2021 1024    DIAGNOSTIC IMAGING:  I have independently reviewed the scans and discussed with the patient. NM PET Image Restag (PS) Skull Base To Thigh  Result Date: 03/18/2021 CLINICAL DATA:  Subsequent treatment strategy for B-cell lymphoma. EXAM: NUCLEAR MEDICINE PET SKULL BASE TO THIGH TECHNIQUE: 9.7 mCi F-18 FDG was injected intravenously. Full-ring PET imaging was performed from the skull base to thigh after the radiotracer. CT data was obtained and used for attenuation correction and anatomic localization. Fasting blood glucose: 95 mg/dl COMPARISON:  PET-CT dated 01/07/2021 FINDINGS: Mediastinal blood pool activity: SUV max 2.4 Liver activity: SUV max 2.6 NECK: No hypermetabolic cervical lymphadenopathy. Incidental CT findings: none CHEST: 11 mm short axis prevascular node (series 3/image 103), max SUV 3.1. Additional small mediastinal nodes. No suspicious pulmonary nodules. Right chest port terminates at the cavoatrial junction. Incidental CT findings: Cardiomegaly. ABDOMEN/PELVIS: 2.3 cm short axis nodal mass in the left upper abdomen (series 3/image 194), grossly unchanged, max SUV 6.1, previously 4.4. Otherwise, no suspicious abdominopelvic lymphadenopathy. Spleen is normal in size.  Reference splenic max SUV  2.0. No abnormal hypermetabolism in the liver, pancreas, or adrenal glands. Incidental CT findings: Motion degraded images. Mild atherosclerotic calcifications of the abdominal aorta and branch vessels. SKELETON:  No focal hypermetabolic activity to suggest skeletal metastasis. Incidental CT findings: none IMPRESSION: 11 mm short axis prevascular node with mild hypermetabolism, new. 2.3 cm short axis nodal mass in the left upper abdomen, grossly unchanged, although possibly with mildly increased hypermetabolism. Overall, this is considered Deauville criteria 5. Electronically Signed   By: Julian Hy M.D.   On: 03/18/2021 12:38     ASSESSMENT:  1.B-cell high-grade lymphoma: -Presentation to the ER with left upper quadrant and mid abdominal pain for 2 weeks. -10 pound weight loss in the last 2 weeks due to decreased appetite. Denies any fevers or night sweats. -CT AP with contrast on 07/24/2020 shows 13.8 11.2 cm mass in the epigastric region, lobulated large mass in the mesenteric region, 9.3 x 5.1 cm mass in the inferior pelvis, 9.7 x 4.1 cm mass in the superior pelvis. Epigastric mass seems to abut posterior spleen. -LDH was elevated at 309, uric acid 10.2. -PET scan on 07/31/2020 showed extensive multifocal soft tissue masses within the abdomen and pelvis, most of them between SUV 15 and 20. FDG avid subpleural nodules overlying the right lung. Splenomegaly. Multiple scattered lung nodules, nonspecific. -Biopsy of the soft tissue mass in the upper abdomen on 08/15/2020 consistent with high-grade B-cell lymphoma. IHC positive for CD20, CD10, BCL6, negative for BCL-2, CD3, CD5, CD30, mum 1 and EBV. Ki-67 is 80-90%. 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 -He was evaluated by Dr. Eldridge Abrahams [facility] and was not felt to be a candidate for clinical trial. -6 cycles of R-EPOCH from 08/28/2020 through 12/23/2020. -PET scan on 10/28/2020 showed marked interval response with most of the sites of hypermetabolic disease with no residual hypermetabolism. One of the more dominant foci of disease in the left upper quadrant of the abdomen does  show low-level of hypermetabolism compatible with Deauville 4. No new hypermetabolic disease. Persistent splenomegaly without hypermetabolism in the parenchyma. - PET scan on 3/1/2022Left upper quadrant lesion posterior to the stomach measures slightly smaller, at 3.2 x 3.1 cm compared to 4.5 x 3.5 cm previously. This has SUV max of 4.4 compared to 4.5 previously. Similar appearance of splenomegaly without hypermetabolism. No new sites of suspicious hypermetabolic disease identified.   2. Social/family history: -He quit smoking last year. -Family history significant for mother and father who had multiple myeloma. Grandfather had colon cancer.  3. 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  4. CHF: -Echocardiogram on 09/16/2020 with EF 50%. -Echocardiogram on 08/22/2020 with EF 45-50%. -Echocardiogram on 09/18/2019 shows EF 40-45%. -He is on Coreg 6.25 mg twice daily, Imdur 30 mg daily. Lasix 40 mg is an expired medication list.   PLAN:  1.Double hitlarge B celllymphoma: -He was evaluated by Dr. Tana Coast at [facility] - We will plan to repeat his PET scan in the first or second week of May. - Reviewed his labs from [facility].  Mildly elevated LDH. - Physical examination today did not reveal any palpable masses.  He reported 1 episode of nausea and vomiting while sitting in the car.  He also had 1 episode of pain in the lower abdomen 1 week prior to that nausea episode. - We will see him back after the PET scan.  2. Abdominal pain: -He is rarely requiring oxycodone.  3. Nausea: -Continue Zofran as needed.  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  5. Hypokalemia: -Continue potassium 20 mEq daily.  6.  Hypertension: - He ran out of blood pressure medications.  His blood pressure today is high. - I have renewed his losartan, hydralazine and Coreg.  Orders placed this encounter:  No orders of  the defined types were placed in this encounter.    Derek Jack, MD [place] [phone]   I, Thana Ates, am acting as a scribe for Dr. Derek Jack.  {Add Barista Statement}

## 2021-03-25 ENCOUNTER — Other Ambulatory Visit (HOSPITAL_COMMUNITY): Payer: 59

## 2021-03-25 ENCOUNTER — Inpatient Hospital Stay (HOSPITAL_BASED_OUTPATIENT_CLINIC_OR_DEPARTMENT_OTHER): Payer: 59 | Admitting: Hematology

## 2021-03-25 ENCOUNTER — Other Ambulatory Visit: Payer: Self-pay

## 2021-03-25 ENCOUNTER — Encounter (HOSPITAL_COMMUNITY): Payer: Self-pay | Admitting: Hematology

## 2021-03-25 VITALS — BP 146/97 | HR 76 | Temp 96.9°F | Resp 18 | Wt 173.9 lb

## 2021-03-25 DIAGNOSIS — E876 Hypokalemia: Secondary | ICD-10-CM | POA: Diagnosis not present

## 2021-03-25 DIAGNOSIS — C8510 Unspecified B-cell lymphoma, unspecified site: Secondary | ICD-10-CM

## 2021-03-25 DIAGNOSIS — Z9221 Personal history of antineoplastic chemotherapy: Secondary | ICD-10-CM | POA: Diagnosis not present

## 2021-03-25 DIAGNOSIS — B20 Human immunodeficiency virus [HIV] disease: Secondary | ICD-10-CM | POA: Diagnosis not present

## 2021-03-25 DIAGNOSIS — Z87891 Personal history of nicotine dependence: Secondary | ICD-10-CM | POA: Diagnosis not present

## 2021-03-25 DIAGNOSIS — I110 Hypertensive heart disease with heart failure: Secondary | ICD-10-CM | POA: Diagnosis not present

## 2021-03-25 DIAGNOSIS — R110 Nausea: Secondary | ICD-10-CM | POA: Diagnosis not present

## 2021-03-25 DIAGNOSIS — Z79899 Other long term (current) drug therapy: Secondary | ICD-10-CM | POA: Diagnosis not present

## 2021-03-25 DIAGNOSIS — I43 Cardiomyopathy in diseases classified elsewhere: Secondary | ICD-10-CM | POA: Diagnosis not present

## 2021-03-25 DIAGNOSIS — F17210 Nicotine dependence, cigarettes, uncomplicated: Secondary | ICD-10-CM | POA: Diagnosis not present

## 2021-03-25 DIAGNOSIS — Z9114 Patient's other noncompliance with medication regimen: Secondary | ICD-10-CM | POA: Diagnosis not present

## 2021-03-25 DIAGNOSIS — I509 Heart failure, unspecified: Secondary | ICD-10-CM | POA: Diagnosis not present

## 2021-03-25 DIAGNOSIS — G893 Neoplasm related pain (acute) (chronic): Secondary | ICD-10-CM | POA: Diagnosis not present

## 2021-03-25 DIAGNOSIS — C8593 Non-Hodgkin lymphoma, unspecified, intra-abdominal lymph nodes: Secondary | ICD-10-CM | POA: Diagnosis not present

## 2021-03-25 NOTE — Patient Instructions (Signed)
[place] at [facility] Discharge Instructions  You were seen today by Dr. Delton Coombes. He went over your recent results. You will be scheduled for a PET scan prior to your next appointment. Dr. Delton Coombes will see you back in 2 months for labs and follow up.   Thank you for choosing [place] at [facility] to provide your oncology and hematology care.  To afford each patient quality time with our provider, please arrive at least 15 minutes before your scheduled appointment time.   If you have a lab appointment with the [facility] please come in thru the Main Entrance and check in at the main information desk  You need to re-schedule your appointment should you arrive 10 or more minutes late.  We strive to give you quality time with our providers, and arriving late affects you and other patients whose appointments are after yours.  Also, if you no show three or more times for appointments you may be dismissed from the clinic at the providers discretion.     Again, thank you for choosing [facility].  Our hope is that these requests will decrease the amount of time that you wait before being seen by our physicians.       _____________________________________________________________  Should you have questions after your visit to [facility], please contact our office at [phone] between the hours of 8:00 a.m. and 4:30 p.m.  Voicemails left after 4:00 p.m. will not be returned until the following business day.  For prescription refill requests, have your pharmacy contact our office and allow 72 hours.    Cancer Center Support Programs:   > Cancer Support Group  2nd Tuesday of the month 1pm-2pm, Journey Room

## 2021-03-25 NOTE — Progress Notes (Signed)
[place], [place] [address]   CLINIC:  Medical Oncology/Hematology  PCP:  Lindell Spar, MD [address] / [place] [address]  [phone]  REASON FOR VISIT:  Follow-up for hihg-grade B-cell lymphoma  PRIOR THERAPY:  R-EPOCH x 6 cycles from 09/02/2020 to 12/27/2020  CURRENT THERAPY: observation  INTERVAL HISTORY:  [name], a 40 y.o. male, returns for routine follow-up for his high-grade B-cell lymphoma. Richard Cox was last seen on 02/25/2021.  Today he reports feeling well. He reports, beginning several days ago, experiencing intermittent cramping in his abdomen which has waned by this past weekend. He denies diarrhea.     REVIEW OF SYSTEMS:  Review of Systems  Constitutional: Positive for fatigue (50% sleeping frequently). Negative for appetite change.  HENT:   Positive for trouble swallowing (liquids).   Respiratory: Positive for shortness of breath (w/ activity and allergies).   Gastrointestinal: Positive for nausea and vomiting. Negative for diarrhea.  Neurological: Positive for numbness (feet).  All other systems reviewed and are negative.   PAST MEDICAL/SURGICAL HISTORY:  Past Medical History:  Diagnosis Date  . CHF (congestive heart failure) ([place])   . Essential hypertension   . HIV infection ([place])   . Noncompliance with medication regimen   . 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 IR IMAGING GUIDED PORT INSERTION  08/23/2020   Right  . NO PAST SURGERIES      SOCIAL HISTORY:  Social History   Socioeconomic History  . Marital status: Single    Spouse name: Not on file  . Number of children: 0  . Years of education: 66  . Highest education level: Not on file  Occupational History  . Not on file  Tobacco Use  . Smoking status: Light Tobacco Smoker    Packs/day: 0.25    Types: Cigarettes  . Smokeless tobacco: Never Used  Vaping Use  . Vaping Use:  Never used  Substance and Sexual Activity  . Alcohol use: Not Currently  . Drug use: Not Currently    Types: Marijuana    Comment: x 1 week  . Sexual activity: Not Currently    Comment: pt given condoms  Other Topics Concern  . Not on file  Social History Narrative  . Not on file   Social Determinants of Health   Financial Resource Strain: Low Risk   . Difficulty of Paying Living Expenses: Not very hard  Food Insecurity: No Food Insecurity  . Worried About Charity fundraiser in the Last Year: Never true  . Ran Out of Food in the Last Year: Never true  Transportation Needs: No Transportation Needs  . Lack of Transportation (Medical): No  . Lack of Transportation (Non-Medical): No  Physical Activity: Insufficiently Active  . Days of Exercise per Week: 5 days  . Minutes of Exercise per Session: 20 min  Stress: No Stress Concern Present  . Feeling of Stress : Only a little  Social Connections: Socially Isolated  . Frequency of Communication with Friends and Family: More than three times a week  . Frequency of Social Gatherings with Friends and Family: Never  . Attends Religious Services: Never  . Active Member of Clubs or Organizations: No  . Attends Archivist Meetings: Not on file  . Marital Status: Never married  Intimate Partner Violence: Not At Risk  . Fear of Current or Ex-Partner: No  . Emotionally Abused:  No  . Physically Abused: No  . Sexually Abused: No    FAMILY HISTORY:  Family History  Problem Relation Age of Onset  . Diabetes Mellitus II Mother   . Cancer Mother        multiple myeloma  . Cancer Father        multiple myeloma  . Congestive Heart Failure Brother   . Heart Problems Brother   . Lupus Brother     CURRENT MEDICATIONS:  Current Outpatient Medications  Medication Sig Dispense Refill  . acetaminophen (TYLENOL) 500 MG tablet Take 1,000 mg by mouth every 6 (six) hours as needed for moderate pain or headache.    . allopurinol  (ZYLOPRIM) 300 MG tablet Take 1 tablet (300 mg total) by mouth daily. 30 tablet 3  . bictegravir-emtricitabine-tenofovir AF (BIKTARVY) 50-200-25 MG TABS tablet Take 1 tablet by mouth daily. 30 tablet 5  . carvedilol (COREG) 6.25 MG tablet Take 1 tablet (6.25 mg total) by mouth 2 (two) times daily with a meal. 60 tablet 1  . dicyclomine (BENTYL) 10 MG capsule     . docusate sodium (COLACE) 100 MG capsule Take 100 mg by mouth daily as needed for mild constipation.    . furosemide (LASIX) 40 MG tablet Take 1 tablet (40 mg total) by mouth daily. 30 tablet 3  . hydrALAZINE (APRESOLINE) 50 MG tablet Take 1 tablet (50 mg total) by mouth 3 (three) times daily. 90 tablet 1  . isosorbide mononitrate (IMDUR) 30 MG 24 hr tablet Take 30 mg by mouth daily.    Marland Kitchen lidocaine-prilocaine (EMLA) cream Apply 1 application topically as needed. 30 g 0  . losartan (COZAAR) 50 MG tablet Take 1 tablet (50 mg total) by mouth daily. 90 tablet 1  . ondansetron (ZOFRAN-ODT) 4 MG disintegrating tablet     . oxyCODONE (OXY IR/ROXICODONE) 5 MG immediate release tablet Take 1 tablet (5 mg total) by mouth every 12 (twelve) hours as needed for severe pain. 60 tablet 0  . potassium chloride (KLOR-CON) 20 MEQ tablet Take 0.5 tablets (10 mEq total) by mouth daily. 30 tablet 1  . prochlorperazine (COMPAZINE) 10 MG tablet Take 1 tablet (10 mg total) by mouth every 6 (six) hours as needed for nausea or vomiting. 30 tablet 2  . sulfamethoxazole-trimethoprim (BACTRIM DS) 800-160 MG tablet TAKE 1 TABLET BY MOUTH DAILY. 30 tablet 5   No current facility-administered medications for this visit.   Facility-Administered Medications Ordered in Other Visits  Medication Dose Route Frequency Provider Last Rate Last Admin  . 0.9 %  sodium chloride infusion   Intravenous Continuous Derek Jack, MD      . 0.9 %  sodium chloride infusion   Intravenous Continuous Derek Jack, MD   Stopped at 09/26/20 1124  . 0.9 %  sodium chloride  infusion   Intravenous Continuous Derek Jack, MD 20 mL/hr at 11/04/20 0934 New Bag at 11/04/20 0934  . 0.9 %  sodium chloride infusion   Intravenous Continuous Derek Jack, MD 500 mL/hr at 11/04/20 0935 Bolus from Bag at 11/04/20 0935  . 0.9 %  sodium chloride infusion   Intravenous Continuous Derek Jack, MD   Stopped at 11/07/20 1110  . 0.9 %  sodium chloride infusion   Intravenous Continuous Derek Jack, MD   Stopped at 12/25/20 1117  . 0.9 %  sodium chloride infusion   Intravenous Continuous Derek Jack, MD      . sodium chloride flush (NS) 0.9 % injection 10 mL  10  mL Intravenous PRN Derek Jack, MD   10 mL at 10/15/20 1035  . sodium chloride flush (NS) 0.9 % injection 10 mL  10 mL Intracatheter PRN Derek Jack, MD   10 mL at 11/04/20 0800  . sodium chloride flush (NS) 0.9 % injection 10 mL  10 mL Intravenous PRN Derek Jack, MD   10 mL at 11/07/20 1046    ALLERGIES:  Allergies  Allergen Reactions  . Neosporin [Neomycin-Bacitracin Zn-Polymyx] Rash    PHYSICAL EXAM:  Performance status (ECOG): 0 - Asymptomatic  There were no vitals filed for this visit. Wt Readings from Last 3 Encounters:  02/25/21 178 lb (80.7 kg)  01/27/21 174 lb (78.9 kg)  01/14/21 170 lb 3.1 oz (77.2 kg)   Physical Exam Vitals reviewed.  Constitutional:      Appearance: Normal appearance.  Cardiovascular:     Rate and Rhythm: Normal rate and regular rhythm.     Pulses: Normal pulses.     Heart sounds: Normal heart sounds.  Pulmonary:     Effort: Pulmonary effort is normal.     Breath sounds: Normal breath sounds.  Chest:  Breasts:     Right: No supraclavicular adenopathy.     Left: No supraclavicular adenopathy.    Abdominal:     Palpations: Abdomen is soft. There is no hepatomegaly, splenomegaly or mass.     Tenderness: There is no abdominal tenderness.  Musculoskeletal:     Right lower leg: No edema.     Left lower leg:  No edema.  Lymphadenopathy:     Cervical: No cervical adenopathy.     Right cervical: No superficial cervical adenopathy.    Left cervical: No superficial cervical adenopathy.     Upper Body:     Right upper body: No supraclavicular adenopathy.     Left upper body: No supraclavicular adenopathy.     Lower Body: No right inguinal adenopathy. No left inguinal adenopathy.  Neurological:     General: No focal deficit present.     Mental Status: He is alert and oriented to person, place, and time.  Psychiatric:        Mood and Affect: Mood normal.        Behavior: Behavior normal.     LABORATORY DATA:  I have reviewed the labs as listed.  CBC Latest Ref Rng & Units 01/14/2021 12/23/2020 12/02/2020  WBC 4.0 - 10.5 K/uL 5.7 9.6 4.6  Hemoglobin 13.0 - 17.0 g/dL 11.0(L) 11.8(L) 10.6(L)  Hematocrit 39.0 - 52.0 % 34.3(L) 36.4(L) 32.6(L)  Platelets 150 - 400 K/uL 132(L) 126(L) 114(L)   CMP Latest Ref Rng & Units 01/27/2021 01/14/2021 12/23/2020  Glucose 65 - 99 mg/dL 91 96 131(H)  BUN 7 - 25 mg/dL _0 Creatinine 0.60 - 1.35 mg/dL 0.91 1.16 1.11  Sodium 135 - 146 mmol/L 140 138 139  Potassium 3.5 - 5.3 mmol/L 4.0 4.4 3.9  Chloride 98 - 110 mmol/L 108 108 106  CO2 20 - 32 mmol/L _1 Calcium 8.6 - 10.3 mg/dL 8.9 9.2 9.4  Total Protein 6.1 - 8.1 g/dL 6.8 7.0 6.8  Total Bilirubin 0.2 - 1.2 mg/dL 0.4 0.4 0.5  Alkaline Phos 38 - 126 U/L - 67 73  AST 10 - 40 U/L _2 ALT 9 - 46 U/L _3 Component Value Date/Time   RBC 3.46 (L) 01/14/2021 1024   MCV 99.1 01/14/2021 1024   MCH 31.8 01/14/2021  1024   MCHC 32.1 01/14/2021 1024   RDW 16.6 (H) 01/14/2021 1024   LYMPHSABS 0.9 01/14/2021 1024   MONOABS 0.7 01/14/2021 1024   EOSABS 0.0 01/14/2021 1024   BASOSABS 0.0 01/14/2021 1024    DIAGNOSTIC IMAGING:  I have independently reviewed the scans and discussed with the patient. NM PET Image Restag (PS) Skull Base To Thigh  Result Date: 03/18/2021 CLINICAL DATA:   Subsequent treatment strategy for B-cell lymphoma. EXAM: NUCLEAR MEDICINE PET SKULL BASE TO THIGH TECHNIQUE: 9.7 mCi F-18 FDG was injected intravenously. Full-ring PET imaging was performed from the skull base to thigh after the radiotracer. CT data was obtained and used for attenuation correction and anatomic localization. Fasting blood glucose: 95 mg/dl COMPARISON:  PET-CT dated 01/07/2021 FINDINGS: Mediastinal blood pool activity: SUV max 2.4 Liver activity: SUV max 2.6 NECK: No hypermetabolic cervical lymphadenopathy. Incidental CT findings: none CHEST: 11 mm short axis prevascular node (series 3/image 103), max SUV 3.1. Additional small mediastinal nodes. No suspicious pulmonary nodules. Right chest port terminates at the cavoatrial junction. Incidental CT findings: Cardiomegaly. ABDOMEN/PELVIS: 2.3 cm short axis nodal mass in the left upper abdomen (series 3/image 194), grossly unchanged, max SUV 6.1, previously 4.4. Otherwise, no suspicious abdominopelvic lymphadenopathy. Spleen is normal in size.  Reference splenic max SUV 2.0. No abnormal hypermetabolism in the liver, pancreas, or adrenal glands. Incidental CT findings: Motion degraded images. Mild atherosclerotic calcifications of the abdominal aorta and branch vessels. SKELETON: No focal hypermetabolic activity to suggest skeletal metastasis. Incidental CT findings: none IMPRESSION: 11 mm short axis prevascular node with mild hypermetabolism, new. 2.3 cm short axis nodal mass in the left upper abdomen, grossly unchanged, although possibly with mildly increased hypermetabolism. Overall, this is considered Deauville criteria 5. Electronically Signed   By: Julian Hy M.D.   On: 03/18/2021 12:38     ASSESSMENT:  1.B-cell high-grade lymphoma: -Presentation to the ER with left upper quadrant and mid abdominal pain for 2 weeks. -10 pound weight loss in the last 2 weeks due to decreased appetite. Denies any fevers or night sweats. -CT AP with  contrast on 07/24/2020 shows 13.8 11.2 cm mass in the epigastric region, lobulated large mass in the mesenteric region, 9.3 x 5.1 cm mass in the inferior pelvis, 9.7 x 4.1 cm mass in the superior pelvis. Epigastric mass seems to abut posterior spleen. -LDH was elevated at 309, uric acid 10.2. -PET scan on 07/31/2020 showed extensive multifocal soft tissue masses within the abdomen and pelvis, most of them between SUV 15 and 20. FDG avid subpleural nodules overlying the right lung. Splenomegaly. Multiple scattered lung nodules, nonspecific. -Biopsy of the soft tissue mass in the upper abdomen on 08/15/2020 consistent with high-grade B-cell lymphoma. IHC positive for CD20, CD10, BCL6, negative for BCL-2, CD3, CD5, CD30, mum 1 and EBV. Ki-67 is 80-90%. 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 -He was evaluated by Dr. Eldridge Abrahams [facility] and was not felt to be a candidate for clinical trial. -6 cycles of R-EPOCH from 08/28/2020 through 12/23/2020. -PET scan on 10/28/2020 showed marked interval response with most of the sites of hypermetabolic disease with no residual hypermetabolism. One of the more dominant foci of disease in the left upper quadrant of the abdomen does show low-level of hypermetabolism compatible with Deauville 4. No new hypermetabolic disease. Persistent splenomegaly without hypermetabolism in the parenchyma. - PET scan on 3/1/2022Left upper quadrant lesion posterior to the stomach measures slightly smaller,  at 3.2 x 3.1 cm compared to 4.5 x 3.5 cm previously. This has SUV max of 4.4 compared to 4.5 previously. Similar appearance of splenomegaly without hypermetabolism. No new sites of suspicious hypermetabolic disease identified. - PET scan on 03/17/2021 shows 2.3 cm nodal mass in the left upper abdomen, grossly unchanged, SUV 6.1, previously 4.4.  11 mm short axis prevascular node, maximum SUV 3.1.   2.  Social/family history: -He quit smoking last year. -Family history significant for mother and father who had multiple myeloma. Grandfather had colon cancer.  3. 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  4. CHF: -Echocardiogram on 09/16/2020 with EF 50%. -Echocardiogram on 08/22/2020 with EF 45-50%. -Echocardiogram on 09/18/2019 shows EF 40-45%. -He is on Coreg 6.25 mg twice daily, Imdur 30 mg daily. Lasix 40 mg is an expired medication list.   PLAN:  1.Double hitlarge B celllymphoma: -He does not have any B symptoms at this time.  No palpable adenopathy or abdominal masses. - Reviewed PET scan from 03/17/2021 which showed 11 mm short axis prevascular node, SUV 3.1.  2.3 cm short axis normal mass in the left upper abdomen, grossly unchanged, maximum SUV 6.1, previously 4.4.  Spleen is normal in size.  No other masses. - I have recommended that he check LDH and routine labs today. - I have recommended repeat PET scan in 2 months to follow-up on both areas.  He is agreeable to the plan.  If any new symptoms, he will call us back and see [place] sooner.  2. Abdominal pain: -He is rarely requiring oxycodone.  3. Nausea: -Continue Zofran for intermittent nausea.  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  5. Hypokalemia: -Continue potassium 20 mEq daily.  6.  Hypertension: -  Continue losartan, hydralazine and Coreg.  Orders placed this encounter:  No orders of the defined types were placed in this encounter.    Derek Jack, MD [place] [phone]   I, Thana Ates, am acting as a scribe for Dr. Derek Jack.  I, Derek Jack MD, have reviewed the above documentation for accuracy and completeness, and I agree with the above.

## 2021-04-08 ENCOUNTER — Other Ambulatory Visit (HOSPITAL_COMMUNITY): Payer: Self-pay

## 2021-04-08 DIAGNOSIS — C8510 Unspecified B-cell lymphoma, unspecified site: Secondary | ICD-10-CM

## 2021-04-08 MED ORDER — OXYCODONE HCL 5 MG PO TABS: 5.0000 mg | ORAL_TABLET | Freq: Two times a day (BID) | ORAL | 0 refills | Status: DC | PRN

## 2021-04-10 ENCOUNTER — Other Ambulatory Visit: Payer: Self-pay

## 2021-04-10 ENCOUNTER — Inpatient Hospital Stay (HOSPITAL_COMMUNITY): Payer: 59 | Attending: Hematology

## 2021-04-10 DIAGNOSIS — C8510 Unspecified B-cell lymphoma, unspecified site: Secondary | ICD-10-CM

## 2021-04-10 DIAGNOSIS — Z8572 Personal history of non-Hodgkin lymphomas: Secondary | ICD-10-CM | POA: Insufficient documentation

## 2021-04-10 DIAGNOSIS — C8593 Non-Hodgkin lymphoma, unspecified, intra-abdominal lymph nodes: Secondary | ICD-10-CM

## 2021-04-10 LAB — CBC WITH DIFFERENTIAL/PLATELET
Abs Immature Granulocytes: 0.01 10*3/uL (ref 0.00–0.07)
Basophils Absolute: 0 10*3/uL (ref 0.0–0.1)
Basophils Relative: 0 %
Eosinophils Absolute: 0.1 10*3/uL (ref 0.0–0.5)
Eosinophils Relative: 1 %
HCT: 39.1 % (ref 39.0–52.0)
Hemoglobin: 12.8 g/dL — ABNORMAL LOW (ref 13.0–17.0)
Immature Granulocytes: 0 %
Lymphocytes Relative: 18 %
Lymphs Abs: 0.9 10*3/uL (ref 0.7–4.0)
MCH: 30.3 pg (ref 26.0–34.0)
MCHC: 32.7 g/dL (ref 30.0–36.0)
MCV: 92.7 fL (ref 80.0–100.0)
Monocytes Absolute: 0.2 10*3/uL (ref 0.1–1.0)
Monocytes Relative: 5 %
Neutro Abs: 3.8 10*3/uL (ref 1.7–7.7)
Neutrophils Relative %: 76 %
Platelets: 115 10*3/uL — ABNORMAL LOW (ref 150–400)
RBC: 4.22 MIL/uL (ref 4.22–5.81)
RDW: 16.3 % — ABNORMAL HIGH (ref 11.5–15.5)
WBC: 5 10*3/uL (ref 4.0–10.5)
nRBC: 0 % (ref 0.0–0.2)

## 2021-04-10 LAB — COMPREHENSIVE METABOLIC PANEL
ALT: 13 U/L (ref 0–44)
AST: 23 U/L (ref 15–41)
Albumin: 4 g/dL (ref 3.5–5.0)
Alkaline Phosphatase: 71 U/L (ref 38–126)
Anion gap: 11 (ref 5–15)
BUN: 18 mg/dL (ref 6–20)
CO2: 22 mmol/L (ref 22–32)
Calcium: 9 mg/dL (ref 8.9–10.3)
Chloride: 105 mmol/L (ref 98–111)
Creatinine, Ser: 1.26 mg/dL — ABNORMAL HIGH (ref 0.61–1.24)
GFR, Estimated: >60 mL/min (ref >60)
Glucose, Bld: 145 mg/dL — ABNORMAL HIGH (ref 70–99)
Potassium: 3.6 mmol/L (ref 3.5–5.1)
Sodium: 138 mmol/L (ref 135–145)
Total Bilirubin: 0.4 mg/dL (ref 0.3–1.2)
Total Protein: 7.2 g/dL (ref 6.5–8.1)

## 2021-04-10 LAB — LACTATE DEHYDROGENASE: LDH: 227 U/L — ABNORMAL HIGH (ref 98–192)

## 2021-04-11 LAB — PROTEIN ELECTROPHORESIS, SERUM
A/G Ratio: 1.2 (ref 0.7–1.7)
Albumin ELP: 3.7 g/dL (ref 2.9–4.4)
Alpha-1-Globulin: 0.2 g/dL (ref 0.0–0.4)
Alpha-2-Globulin: 1 g/dL (ref 0.4–1.0)
Beta Globulin: 0.9 g/dL (ref 0.7–1.3)
Gamma Globulin: 0.9 g/dL (ref 0.4–1.8)
Globulin, Total: 3 g/dL (ref 2.2–3.9)
Total Protein ELP: 6.7 g/dL (ref 6.0–8.5)

## 2021-05-09 ENCOUNTER — Other Ambulatory Visit (HOSPITAL_COMMUNITY): Payer: Self-pay

## 2021-05-09 ENCOUNTER — Other Ambulatory Visit: Payer: Self-pay

## 2021-05-09 ENCOUNTER — Encounter (HOSPITAL_COMMUNITY): Payer: Self-pay | Admitting: *Deleted

## 2021-05-09 DIAGNOSIS — C8510 Unspecified B-cell lymphoma, unspecified site: Secondary | ICD-10-CM

## 2021-05-09 DIAGNOSIS — B20 Human immunodeficiency virus [HIV] disease: Secondary | ICD-10-CM

## 2021-05-09 MED ORDER — OXYCODONE HCL 5 MG PO TABS: 5.0000 mg | ORAL_TABLET | Freq: Two times a day (BID) | ORAL | 0 refills | Status: DC | PRN

## 2021-05-09 NOTE — Telephone Encounter (Signed)
Patient states taking zofran very little.  Advised to take first thing in the morning and see if this helps alleviate nausea.

## 2021-05-13 ENCOUNTER — Other Ambulatory Visit: Payer: Self-pay | Admitting: Pharmacist

## 2021-05-13 DIAGNOSIS — B20 Human immunodeficiency virus [HIV] disease: Secondary | ICD-10-CM

## 2021-05-15 ENCOUNTER — Other Ambulatory Visit: Payer: 59

## 2021-05-15 ENCOUNTER — Other Ambulatory Visit (HOSPITAL_COMMUNITY): Payer: 59

## 2021-05-16 ENCOUNTER — Other Ambulatory Visit: Payer: Self-pay

## 2021-05-16 ENCOUNTER — Encounter (HOSPITAL_COMMUNITY): Payer: Self-pay | Admitting: Hematology

## 2021-05-16 ENCOUNTER — Inpatient Hospital Stay (HOSPITAL_COMMUNITY): Payer: 59 | Attending: Hematology

## 2021-05-16 DIAGNOSIS — C8510 Unspecified B-cell lymphoma, unspecified site: Secondary | ICD-10-CM | POA: Diagnosis present

## 2021-05-16 DIAGNOSIS — I5042 Chronic combined systolic (congestive) and diastolic (congestive) heart failure: Secondary | ICD-10-CM

## 2021-05-16 LAB — CBC WITH DIFFERENTIAL/PLATELET
Abs Immature Granulocytes: 0.01 10*3/uL (ref 0.00–0.07)
Basophils Absolute: 0 10*3/uL (ref 0.0–0.1)
Basophils Relative: 0 %
Eosinophils Absolute: 0.1 10*3/uL (ref 0.0–0.5)
Eosinophils Relative: 1 %
HCT: 40.5 % (ref 39.0–52.0)
Hemoglobin: 13.5 g/dL (ref 13.0–17.0)
Immature Granulocytes: 0 %
Lymphocytes Relative: 21 %
Lymphs Abs: 0.9 10*3/uL (ref 0.7–4.0)
MCH: 30.1 pg (ref 26.0–34.0)
MCHC: 33.3 g/dL (ref 30.0–36.0)
MCV: 90.4 fL (ref 80.0–100.0)
Monocytes Absolute: 0.2 10*3/uL (ref 0.1–1.0)
Monocytes Relative: 5 %
Neutro Abs: 3.2 10*3/uL (ref 1.7–7.7)
Neutrophils Relative %: 73 %
Platelets: 123 10*3/uL — ABNORMAL LOW (ref 150–400)
RBC: 4.48 MIL/uL (ref 4.22–5.81)
RDW: 17.3 % — ABNORMAL HIGH (ref 11.5–15.5)
WBC: 4.5 10*3/uL (ref 4.0–10.5)
nRBC: 0 % (ref 0.0–0.2)

## 2021-05-16 LAB — COMPREHENSIVE METABOLIC PANEL
ALT: 14 U/L (ref 0–44)
AST: 22 U/L (ref 15–41)
Albumin: 4.1 g/dL (ref 3.5–5.0)
Alkaline Phosphatase: 68 U/L (ref 38–126)
Anion gap: 6 (ref 5–15)
BUN: 21 mg/dL — ABNORMAL HIGH (ref 6–20)
CO2: 26 mmol/L (ref 22–32)
Calcium: 9.3 mg/dL (ref 8.9–10.3)
Chloride: 107 mmol/L (ref 98–111)
Creatinine, Ser: 1.23 mg/dL (ref 0.61–1.24)
GFR, Estimated: >60 mL/min (ref >60)
Glucose, Bld: 119 mg/dL — ABNORMAL HIGH (ref 70–99)
Potassium: 3.8 mmol/L (ref 3.5–5.1)
Sodium: 139 mmol/L (ref 135–145)
Total Bilirubin: 0.3 mg/dL (ref 0.3–1.2)
Total Protein: 7.5 g/dL (ref 6.5–8.1)

## 2021-05-16 LAB — LACTATE DEHYDROGENASE: LDH: 231 U/L — ABNORMAL HIGH (ref 98–192)

## 2021-05-16 LAB — MAGNESIUM: Magnesium: 2.1 mg/dL (ref 1.7–2.4)

## 2021-05-16 LAB — PHOSPHORUS: Phosphorus: 3.7 mg/dL (ref 2.5–4.6)

## 2021-05-16 LAB — URIC ACID: Uric Acid, Serum: 6.5 mg/dL (ref 3.7–8.6)

## 2021-05-16 MED ORDER — HEPARIN SOD (PORK) LOCK FLUSH 100 UNIT/ML IV SOLN
500.0000 [IU] | Freq: Once | INTRAVENOUS | Status: AC
  Administered 2021-05-16: 500 [IU] via INTRAVENOUS

## 2021-05-16 MED ORDER — SODIUM CHLORIDE 0.9% FLUSH
10.0000 mL | Freq: Once | INTRAVENOUS | Status: AC
  Administered 2021-05-16: 10 mL via INTRAVENOUS

## 2021-05-16 NOTE — Patient Instructions (Signed)
[facility]  Discharge Instructions: Thank you for choosing [facility] to provide your oncology and hematology care.  If you have a lab appointment with the Cancer Center, please come in thru the Main Entrance and check in at the main information desk.  Wear comfortable clothing and clothing appropriate for easy access to any Portacath or PICC line.   We strive to give you quality time with your provider. You may need to reschedule your appointment if you arrive late (15 or more minutes).  Arriving late affects you and other patients whose appointments are after yours.  Also, if you miss three or more appointments without notifying the office, you may be dismissed from the clinic at the provider's discretion.      For prescription refill requests, have your pharmacy contact our office and allow 72 hours for refills to be completed.    Today you received the following:  Port flush with lab work      To help prevent nausea and vomiting after your treatment, we encourage you to take your nausea medication as directed.  BELOW ARE SYMPTOMS THAT SHOULD BE REPORTED IMMEDIATELY: *FEVER GREATER THAN 100.4 F (38 C) OR HIGHER *CHILLS OR SWEATING *NAUSEA AND VOMITING THAT IS NOT CONTROLLED WITH YOUR NAUSEA MEDICATION *UNUSUAL SHORTNESS OF BREATH *UNUSUAL BRUISING OR BLEEDING *URINARY PROBLEMS (pain or burning when urinating, or frequent urination) *BOWEL PROBLEMS (unusual diarrhea, constipation, pain near the anus) TENDERNESS IN MOUTH AND THROAT WITH OR WITHOUT PRESENCE OF ULCERS (sore throat, sores in mouth, or a toothache) UNUSUAL RASH, SWELLING OR PAIN  UNUSUAL VAGINAL DISCHARGE OR ITCHING   Items with * indicate a potential emergency and should be followed up as soon as possible or go to the Emergency Department if any problems should occur.  Please show the CHEMOTHERAPY ALERT CARD or IMMUNOTHERAPY ALERT CARD at check-in to the Emergency Department and triage  nurse.  Should you have questions after your visit or need to cancel or reschedule your appointment, please contact  CANCER CENTER [phone]  and follow the prompts.  Office hours are 8:00 a.m. to 4:30 p.m. Monday - Friday. Please note that voicemails left after 4:00 p.m. may not be returned until the following business day.  We are closed weekends and major holidays. You have access to a nurse at all times for urgent questions. Please call the main number to the clinic [phone] and follow the prompts.  For any non-urgent questions, you may also contact your provider using MyChart. We now offer e-Visits for anyone 18 and older to request care online for non-urgent symptoms. For details visit [email or web address].   Also download the MyChart app! Go to the app store, search "MyChart", open the app, select [facility], and log in with your MyChart username and password.  Due to Covid, a mask is required upon entering the hospital/clinic. If you do not have a mask, one will be given to you upon arrival. For doctor visits, patients may have 1 support person aged 18 or older with them. For treatment visits, patients cannot have anyone with them due to current Covid guidelines and our immunocompromised population.  

## 2021-05-16 NOTE — Progress Notes (Signed)
Richard Cox presented for Portacath access and flush.  Portacath located right chest wall accessed with  H 20 needle.  Good blood return present. Portacath flushed with 57ml NS and 500U/87ml Heparin and needle removed intact.  Procedure tolerated well and without incident.   Discharged ambulatory in stable condition.

## 2021-05-19 ENCOUNTER — Other Ambulatory Visit: Payer: Self-pay

## 2021-05-20 ENCOUNTER — Ambulatory Visit (HOSPITAL_COMMUNITY): Admission: RE | Admit: 2021-05-20 | Payer: 59 | Source: Ambulatory Visit

## 2021-05-22 ENCOUNTER — Telehealth (HOSPITAL_COMMUNITY): Payer: Self-pay | Admitting: Physician Assistant

## 2021-05-22 ENCOUNTER — Other Ambulatory Visit (HOSPITAL_COMMUNITY): Payer: Self-pay | Admitting: *Deleted

## 2021-05-22 DIAGNOSIS — C8510 Unspecified B-cell lymphoma, unspecified site: Secondary | ICD-10-CM

## 2021-05-22 MED ORDER — OXYCODONE HCL 5 MG PO TABS: 5.0000 mg | ORAL_TABLET | Freq: Two times a day (BID) | ORAL | 0 refills | Status: DC | PRN

## 2021-05-29 ENCOUNTER — Encounter: Payer: 59 | Admitting: Family

## 2021-06-03 ENCOUNTER — Encounter (HOSPITAL_COMMUNITY): Payer: Self-pay | Admitting: Hematology

## 2021-06-05 ENCOUNTER — Ambulatory Visit (HOSPITAL_COMMUNITY): Admission: RE | Admit: 2021-06-05 | Payer: 59 | Source: Ambulatory Visit

## 2021-06-11 ENCOUNTER — Inpatient Hospital Stay (HOSPITAL_COMMUNITY): Payer: Self-pay | Admitting: Hematology

## 2021-06-12 ENCOUNTER — Ambulatory Visit (HOSPITAL_COMMUNITY)
Admission: RE | Admit: 2021-06-12 | Discharge: 2021-06-12 | Disposition: A | Discharge status: Home / Self Care | Payer: 59 | Source: Ambulatory Visit | Attending: Hematology | Admitting: Hematology

## 2021-06-12 ENCOUNTER — Encounter (HOSPITAL_COMMUNITY): Payer: Self-pay

## 2021-06-12 ENCOUNTER — Other Ambulatory Visit: Payer: Self-pay

## 2021-06-12 DIAGNOSIS — C8510 Unspecified B-cell lymphoma, unspecified site: Secondary | ICD-10-CM | POA: Insufficient documentation

## 2021-06-12 DIAGNOSIS — C8593 Non-Hodgkin lymphoma, unspecified, intra-abdominal lymph nodes: Secondary | ICD-10-CM

## 2021-06-16 ENCOUNTER — Other Ambulatory Visit (HOSPITAL_COMMUNITY): Payer: Self-pay

## 2021-06-16 DIAGNOSIS — C8510 Unspecified B-cell lymphoma, unspecified site: Secondary | ICD-10-CM

## 2021-06-17 ENCOUNTER — Encounter (HOSPITAL_COMMUNITY): Payer: Self-pay | Admitting: *Deleted

## 2021-06-17 ENCOUNTER — Other Ambulatory Visit (HOSPITAL_COMMUNITY): Payer: Self-pay

## 2021-06-17 ENCOUNTER — Ambulatory Visit (HOSPITAL_COMMUNITY): Payer: 59 | Admitting: Hematology and Oncology

## 2021-06-17 DIAGNOSIS — C8510 Unspecified B-cell lymphoma, unspecified site: Secondary | ICD-10-CM

## 2021-06-18 ENCOUNTER — Encounter (HOSPITAL_COMMUNITY): Payer: Self-pay | Admitting: Hematology

## 2021-06-19 ENCOUNTER — Other Ambulatory Visit: Payer: Self-pay

## 2021-06-19 ENCOUNTER — Ambulatory Visit (INDEPENDENT_AMBULATORY_CARE_PROVIDER_SITE_OTHER): Payer: 59 | Admitting: Family

## 2021-06-19 ENCOUNTER — Encounter: Payer: Self-pay | Admitting: Family

## 2021-06-19 VITALS — BP 157/104 | HR 76 | Temp 98.7°F | Wt 172.0 lb

## 2021-06-19 DIAGNOSIS — B20 Human immunodeficiency virus [HIV] disease: Secondary | ICD-10-CM

## 2021-06-19 DIAGNOSIS — Z0000 Encounter for general adult medical examination without abnormal findings: Secondary | ICD-10-CM

## 2021-06-19 MED ORDER — BIKTARVY 50-200-25 MG PO TABS
ORAL_TABLET | ORAL | 5 refills | Status: DC
Start: 2021-06-19 — End: 2021-10-17

## 2021-06-19 NOTE — Assessment & Plan Note (Signed)
   Discussed importance of safe sexual practice to reduce risk of STI.  Condoms declined.  Encouraged to complete routine dental care at his convenience.  Declines vaccines.

## 2021-06-19 NOTE — Patient Instructions (Addendum)
Nice to see you.  We will check your lab work today.  Continue to take your medication daily as prescribed.   Refills have been sent to the pharmacy.  Plan for follow for follow up in 4 months or sooner with lab work on the same day.   Have a great day and stay safe!

## 2021-06-19 NOTE — Assessment & Plan Note (Signed)
Richard Cox continues to have well-controlled virus with good adherence and tolerance to his ART regimen of Biktarvy.  No signs/symptoms of opportunistic infection.  We reviewed lab work and discussed plan of care.  Check blood work today.  Continue current dose of Biktarvy.  Plan for follow-up in 4 months or sooner if needed with lab work on the same day.

## 2021-06-19 NOTE — Progress Notes (Signed)
Brief Narrative   Patient ID: Richard Cox, male    DOB: 05-05-1981, 40 y.o.   MRN: [id number]  Richard Cox is an AA male diagnosed with HIV in May of 2020 with risk factor for acquiring HIV including MSM. No history of opportunistic infection or acute retroviral syndrome. Initial blood work for entry to care with CD4 of 188 and viral load of 65,200 (CDC Stage 3). Genotype was wild with no significant resistance. Sole medication regimen of Biktarvy.   Subjective:    Chief Complaint  Patient presents with   Follow-up    B20     HPI:  Richard Cox is a 40 y.o. male with HIV/AIDS last seen on 01/27/2021 with well-controlled virus and good adherence and tolerance to his ART regimen of Biktarvy.  Viral load at the time was undetectable with CD4 count of 170.  Here today for routine follow-up.  Richard Cox continues to take his Biktarvy daily as prescribed with no adverse side effects or missed doses since his last office visit.  Overall feeling well today with no new concerns/complaints. Denies fevers, chills, night sweats, headaches, changes in vision, neck pain/stiffness, nausea, diarrhea, vomiting, lesions or rashes.  Richard Cox has no problems obtaining medication from the pharmacy remains covered through [employer].  Denies feelings of being down, depressed, or hopeless recently.  No current recreational illicit drug use or alcohol consumption.  Smokes tobacco on occasion.  Condoms offered and declined.  Healthcare maintenance due includes Prevnar, Pneumovax, and routine dental care.   Allergies  Allergen Reactions   Neosporin [Neomycin-Bacitracin Zn-Polymyx] Rash      Outpatient Medications Prior to Visit  Medication Sig Dispense Refill   BIKTARVY 50-200-25 MG TABS tablet TAKE 1 TABLET BY MOUTH 1 TIME A DAY. 30 tablet 0   acetaminophen (TYLENOL) 500 MG tablet Take 1,000 mg by mouth every 6 (six) hours as needed for moderate pain or headache. (Patient not taking: Reported on 06/19/2021)      allopurinol (ZYLOPRIM) 300 MG tablet Take 1 tablet (300 mg total) by mouth daily. (Patient not taking: No sig reported) 30 tablet 3   carvedilol (COREG) 6.25 MG tablet Take 1 tablet (6.25 mg total) by mouth 2 (two) times daily with a meal. (Patient not taking: No sig reported) 60 tablet 1   dicyclomine (BENTYL) 10 MG capsule  (Patient not taking: No sig reported)     docusate sodium (COLACE) 100 MG capsule Take 100 mg by mouth daily as needed for mild constipation. (Patient not taking: No sig reported)     furosemide (LASIX) 40 MG tablet Take 1 tablet (40 mg total) by mouth daily. 30 tablet 3   hydrALAZINE (APRESOLINE) 50 MG tablet Take 1 tablet (50 mg total) by mouth 3 (three) times daily. (Patient not taking: No sig reported) 90 tablet 1   isosorbide mononitrate (IMDUR) 30 MG 24 hr tablet Take 30 mg by mouth daily. (Patient not taking: No sig reported)     lidocaine-prilocaine (EMLA) cream Apply 1 application topically as needed. (Patient not taking: Reported on 06/19/2021) 30 g 0   losartan (COZAAR) 50 MG tablet Take 1 tablet (50 mg total) by mouth daily. (Patient not taking: No sig reported) 90 tablet 1   ondansetron (ZOFRAN-ODT) 4 MG disintegrating tablet  (Patient not taking: No sig reported)     oxyCODONE (OXY IR/ROXICODONE) 5 MG immediate release tablet Take 1 tablet (5 mg total) by mouth every 12 (twelve) hours as needed for severe pain. (Patient not  taking: Reported on 06/19/2021) 30 tablet 0   potassium chloride (KLOR-CON) 20 MEQ tablet Take 0.5 tablets (10 mEq total) by mouth daily. (Patient not taking: No sig reported) 30 tablet 1   prochlorperazine (COMPAZINE) 10 MG tablet Take 1 tablet (10 mg total) by mouth every 6 (six) hours as needed for nausea or vomiting. (Patient not taking: No sig reported) 30 tablet 2   sulfamethoxazole-trimethoprim (BACTRIM DS) 800-160 MG tablet TAKE 1 TABLET BY MOUTH DAILY. (Patient not taking: No sig reported) 30 tablet 5   Facility-Administered Medications  Prior to Visit  Medication Dose Route Frequency Provider Last Rate Last Admin   0.9 %  sodium chloride infusion   Intravenous Continuous Derek Jack, MD       0.9 %  sodium chloride infusion   Intravenous Continuous Derek Jack, MD   Stopped at 09/26/20 1124   0.9 %  sodium chloride infusion   Intravenous Continuous Derek Jack, MD 20 mL/hr at 11/04/20 0934 New Bag at 11/04/20 0934   0.9 %  sodium chloride infusion   Intravenous Continuous Derek Jack, MD 500 mL/hr at 11/04/20 0935 Bolus from Bag at 11/04/20 0935   0.9 %  sodium chloride infusion   Intravenous Continuous Derek Jack, MD   Stopped at 11/07/20 1110   0.9 %  sodium chloride infusion   Intravenous Continuous Derek Jack, MD   Stopped at 12/25/20 1117   0.9 %  sodium chloride infusion   Intravenous Continuous Derek Jack, MD       sodium chloride flush (NS) 0.9 % injection 10 mL  10 mL Intravenous PRN Derek Jack, MD   10 mL at 10/15/20 1035   sodium chloride flush (NS) 0.9 % injection 10 mL  10 mL Intracatheter PRN Derek Jack, MD   10 mL at 11/04/20 0800   sodium chloride flush (NS) 0.9 % injection 10 mL  10 mL Intravenous PRN Derek Jack, MD   10 mL at 11/07/20 1046     Past Medical History:  Diagnosis Date   CHF (congestive heart failure) ([place])    Essential hypertension    HIV infection ([place])    Noncompliance with medication regimen    Secondary cardiomyopathy ([place])      Past Surgical History:  Procedure Laterality Date   IR IMAGING GUIDED PORT INSERTION  08/23/2020   Right   NO PAST SURGERIES        Review of Systems  Constitutional:  Negative for appetite change, chills, fatigue, fever and unexpected weight change.  Eyes:  Negative for visual disturbance.  Respiratory:  Negative for cough, chest tightness, shortness of breath and wheezing.   Cardiovascular:  Negative for chest pain and leg swelling.  Gastrointestinal:   Negative for abdominal pain, constipation, diarrhea, nausea and vomiting.  Genitourinary:  Negative for dysuria, flank pain, frequency, genital sores, hematuria and urgency.  Skin:  Negative for rash.  Allergic/Immunologic: Negative for immunocompromised state.  Neurological:  Negative for dizziness and headaches.     Objective:    BP (!) 157/104   Pulse 76   Temp 98.7 F (37.1 C) (Oral)   Wt 172 lb (78 kg)   SpO2 97%   BMI 22.69 kg/m  Nursing note and vital signs reviewed.  Physical Exam Constitutional:      General: He is not in acute distress.    Appearance: He is well-developed.  Eyes:     Conjunctiva/sclera: Conjunctivae normal.  Cardiovascular:     Rate and Rhythm: Normal rate and  regular rhythm.     Heart sounds: Normal heart sounds. No murmur heard.   No friction rub. No gallop.  Pulmonary:     Effort: Pulmonary effort is normal. No respiratory distress.     Breath sounds: Normal breath sounds. No wheezing or rales.  Chest:     Chest wall: No tenderness.  Abdominal:     General: Bowel sounds are normal.     Palpations: Abdomen is soft.     Tenderness: There is no abdominal tenderness.  Musculoskeletal:     Cervical back: Neck supple.  Lymphadenopathy:     Cervical: No cervical adenopathy.  Skin:    General: Skin is warm and dry.     Findings: No rash.  Neurological:     Mental Status: He is alert and oriented to person, place, and time.  Psychiatric:        Behavior: Behavior normal.        Thought Content: Thought content normal.        Judgment: Judgment normal.     Depression screen [facility] 2/9 01/27/2021 09/10/2020 08/20/2020 07/30/2020 10/10/2019  Decreased Interest 0 - 0 0 0  Down, Depressed, Hopeless 0 1 0 0 1  PHQ - 2 Score 0 1 0 0 1  Altered sleeping - 0 - - -  Tired, decreased energy - 0 - - -  Change in appetite - 0 - - -  Feeling bad or failure about yourself  - 0 - - -  Trouble concentrating - 0 - - -  Moving slowly or fidgety/restless - 0 -  - -  Suicidal thoughts - 0 - - -  PHQ-9 Score - 1 - - -  Difficult doing work/chores - Somewhat difficult - - -       Assessment & Plan:    Patient Active Problem List   Diagnosis Date Noted   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   Encounter to establish care 07/30/2020   Intra-abdominal lymphadenopathy 07/30/2020   Generalized lymphadenopathy 07/25/2020   Varicella zoster 01/04/2020   AIDS (acquired immune deficiency syndrome) ([place]) 10/10/2019   Healthcare maintenance 10/10/2019   Chronic combined systolic and diastolic CHF (congestive heart failure) ([place]) 09/18/2019   HIV (human immunodeficiency virus infection) ([place]) 09/18/2019   Polysubstance abuse -cocaine use in remission 09/18/2019   Alcohol abuse 09/18/2019   Tobacco abuse 09/18/2019   Nonspecific serologic evidence of human immunodeficiency virus (HIV) 03/13/2019   Acute exacerbation of CHF (congestive heart failure) ([place]) 03/10/2019   HTN (hypertension) 03/10/2019   Congestive heart failure ([place]) 03/10/2019   SOB (shortness of breath) 03/10/2019   Noncompliance with medication regimen 03/10/2019     Problem List Items Addressed This Visit       Other   AIDS (acquired immune deficiency syndrome) ([place]) - Primary    Mr. Barrozo continues to have well-controlled virus with good adherence and tolerance to his ART regimen of Biktarvy.  No signs/symptoms of opportunistic infection.  We reviewed lab work and discussed plan of care.  Check blood work today.  Continue current dose of Biktarvy.  Plan for follow-up in 4 months or sooner if needed with lab work on the same day.      Relevant Medications   bictegravir-emtricitabine-tenofovir AF (BIKTARVY) 50-200-25 MG TABS tablet   Other Relevant Orders   HIV-1 RNA quant-no reflex-bld   T-helper cell (CD4)- ([facility] clinic only)   Healthcare maintenance    Discussed importance of safe sexual practice to reduce risk of  STI.  Condoms declined. Encouraged to complete routine  dental care at his convenience. Declines vaccines.        I have changed [employer]. I am also having him maintain his isosorbide mononitrate, furosemide, docusate sodium, acetaminophen, lidocaine-prilocaine, ondansetron, dicyclomine, potassium chloride SA, allopurinol, prochlorperazine, sulfamethoxazole-trimethoprim, hydrALAZINE, losartan, carvedilol, and oxyCODONE.   Meds ordered this encounter  Medications   bictegravir-emtricitabine-tenofovir AF (BIKTARVY) 50-200-25 MG TABS tablet    Sig: TAKE 1 TABLET BY MOUTH 1 TIME A DAY.    Dispense:  30 tablet    Refill:  5    Order Specific Question:   Supervising Provider    Answer:   Carlyle Basques [[id number]]     Follow-up: Return in about 4 months (around 10/19/2021), or if symptoms worsen or fail to improve.   Terri Piedra, MSN, FNP-C Nurse Practitioner [facility] [place] number: [phone]

## 2021-06-20 LAB — T-HELPER CELL (CD4) - (RCID CLINIC ONLY)
CD4 % Helper T Cell: 26 % — ABNORMAL LOW (ref 33–65)
CD4 T Cell Abs: 285 /uL — ABNORMAL LOW (ref 400–1790)

## 2021-06-23 LAB — HIV-1 RNA QUANT-NO REFLEX-BLD
HIV 1 RNA Quant: <20 Copies/mL — ABNORMAL HIGH
HIV-1 RNA Quant, Log: <1.3 Log cps/mL — ABNORMAL HIGH

## 2021-06-25 ENCOUNTER — Ambulatory Visit (HOSPITAL_COMMUNITY): Payer: 59

## 2021-06-26 ENCOUNTER — Ambulatory Visit (HOSPITAL_COMMUNITY)
Admission: RE | Admit: 2021-06-26 | Discharge: 2021-06-26 | Disposition: A | Discharge status: Home / Self Care | Payer: 59 | Source: Ambulatory Visit | Attending: Hematology | Admitting: Hematology

## 2021-06-26 ENCOUNTER — Other Ambulatory Visit: Payer: Self-pay

## 2021-06-26 DIAGNOSIS — C8593 Non-Hodgkin lymphoma, unspecified, intra-abdominal lymph nodes: Secondary | ICD-10-CM | POA: Insufficient documentation

## 2021-06-26 DIAGNOSIS — C8510 Unspecified B-cell lymphoma, unspecified site: Secondary | ICD-10-CM | POA: Insufficient documentation

## 2021-06-26 MED ORDER — FLUDEOXYGLUCOSE F - 18 (FDG) INJECTION
7.4800 | Freq: Once | INTRAVENOUS | Status: AC | PRN
  Administered 2021-06-26: 7.48 via INTRAVENOUS

## 2021-07-01 ENCOUNTER — Inpatient Hospital Stay (HOSPITAL_COMMUNITY): Payer: 59 | Attending: Hematology | Admitting: Hematology and Oncology

## 2021-07-01 ENCOUNTER — Other Ambulatory Visit: Payer: Self-pay

## 2021-07-01 VITALS — BP 141/93 | HR 87 | Temp 97.2°F | Resp 18 | Wt 166.8 lb

## 2021-07-01 DIAGNOSIS — F17210 Nicotine dependence, cigarettes, uncomplicated: Secondary | ICD-10-CM | POA: Diagnosis not present

## 2021-07-01 DIAGNOSIS — E876 Hypokalemia: Secondary | ICD-10-CM | POA: Diagnosis not present

## 2021-07-01 DIAGNOSIS — C8510 Unspecified B-cell lymphoma, unspecified site: Secondary | ICD-10-CM | POA: Diagnosis not present

## 2021-07-01 DIAGNOSIS — Z8572 Personal history of non-Hodgkin lymphomas: Secondary | ICD-10-CM | POA: Diagnosis present

## 2021-07-01 DIAGNOSIS — I509 Heart failure, unspecified: Secondary | ICD-10-CM | POA: Insufficient documentation

## 2021-07-01 DIAGNOSIS — Z79899 Other long term (current) drug therapy: Secondary | ICD-10-CM | POA: Insufficient documentation

## 2021-07-01 DIAGNOSIS — R109 Unspecified abdominal pain: Secondary | ICD-10-CM | POA: Diagnosis not present

## 2021-07-01 DIAGNOSIS — R110 Nausea: Secondary | ICD-10-CM | POA: Insufficient documentation

## 2021-07-01 DIAGNOSIS — C8593 Non-Hodgkin lymphoma, unspecified, intra-abdominal lymph nodes: Secondary | ICD-10-CM | POA: Diagnosis not present

## 2021-07-01 DIAGNOSIS — Z9221 Personal history of antineoplastic chemotherapy: Secondary | ICD-10-CM | POA: Diagnosis not present

## 2021-07-01 DIAGNOSIS — I110 Hypertensive heart disease with heart failure: Secondary | ICD-10-CM | POA: Diagnosis not present

## 2021-07-01 DIAGNOSIS — R590 Localized enlarged lymph nodes: Secondary | ICD-10-CM

## 2021-07-01 DIAGNOSIS — G629 Polyneuropathy, unspecified: Secondary | ICD-10-CM | POA: Insufficient documentation

## 2021-07-01 NOTE — Progress Notes (Signed)
[place], [place] [address]   CLINIC:  Medical Oncology/Hematology  PCP:  Richard Spar, MD [address] / [place] [address]  [phone]  REASON FOR VISIT:  Follow-up for high grade B-cell lymphoma  PRIOR THERAPY:  R-EPOCH x 6 cycles from 09/02/2020 to 12/27/2020  CURRENT THERAPY: observation  INTERVAL HISTORY:  [name], a 40 y.o. male, returns for routine follow-up for his high-grade B-cell lymphoma. Richard Cox was last seen on 03/25/2021.  On exam today Richard Cox notes that he is having a sinus infection.  He reports he is not currently taking any antibiotics and it has been "okay".  He does endorse having feelings like his temperature is elevated.  He notes that his appetite has been so-so.  He has been draining some fluid from his sinuses.  He currently has no enlarged lymph nodes.  He is not having any signs or symptoms concerning for recurrent lymphoma at this time.  His weight has been relatively stable with drop about 6 pounds over the last month.  He otherwise has not had any fevers, chills, sweats, nausea, vomiting or diarrhea.  A full 10 point ROS is listed below.   REVIEW OF SYSTEMS:  Review of Systems  Constitutional:  Positive for fatigue (50% sleeping frequently). Negative for appetite change.  HENT:   Positive for trouble swallowing (liquids).   Respiratory:  Positive for shortness of breath (w/ activity and allergies).   Gastrointestinal:  Positive for nausea and vomiting. Negative for diarrhea.  Neurological:  Positive for numbness (feet).  All other systems reviewed and are negative.  PAST MEDICAL/SURGICAL HISTORY:  Past Medical History:  Diagnosis Date   CHF (congestive heart failure) (HCC)    Essential hypertension    HIV infection ([place])    Noncompliance with medication regimen    Secondary cardiomyopathy ([place])    Past Surgical History:  Procedure Laterality Date   IR IMAGING GUIDED PORT INSERTION   08/23/2020   Right   NO PAST SURGERIES      SOCIAL HISTORY:  Social History   Socioeconomic History   Marital status: Single    Spouse name: Not on file   Number of children: 0   Years of education: 13   Highest education level: Not on file  Occupational History   Not on file  Tobacco Use   Smoking status: Light Smoker    Packs/day: 0.25    Types: Cigarettes   Smokeless tobacco: Never  Vaping Use   Vaping Use: Never used  Substance and Sexual Activity   Alcohol use: Not Currently   Drug use: Not Currently    Types: Marijuana    Comment: x 1 week   Sexual activity: Not Currently    Comment: pt given condoms  Other Topics Concern   Not on file  Social History Narrative   Not on file   Social Determinants of Health   Financial Resource Strain: Low Risk    Difficulty of Paying Living Expenses: Not very hard  Food Insecurity: No Food Insecurity   Worried About Charity fundraiser in the Last Year: Never true   Ran Out of Food in the Last Year: Never true  Transportation Needs: No Transportation Needs   Lack of Transportation (Medical): No   Lack of Transportation (Non-Medical): No  Physical Activity: Insufficiently Active   Days of Exercise per Week: 5 days   Minutes of Exercise per Session: 20 min  Stress:  No Stress Concern Present   Feeling of Stress : Only a little  Social Connections: Socially Isolated   Frequency of Communication with Friends and Family: More than three times a week   Frequency of Social Gatherings with Friends and Family: Never   Attends Religious Services: Never   Printmaker: No   Attends Music therapist: Not on file   Marital Status: Never married  Human resources officer Violence: Not At Risk   Fear of Current or Ex-Partner: No   Emotionally Abused: No   Physically Abused: No   Sexually Abused: No    FAMILY HISTORY:  Family History  Problem Relation Age of Onset   Diabetes Mellitus II Mother     Cancer Mother        multiple myeloma   Cancer Father        multiple myeloma   Congestive Heart Failure Brother    Heart Problems Brother    Lupus Brother     CURRENT MEDICATIONS:  Current Outpatient Medications  Medication Sig Dispense Refill   acetaminophen (TYLENOL) 500 MG tablet Take 1,000 mg by mouth every 6 (six) hours as needed for moderate pain or headache.     allopurinol (ZYLOPRIM) 300 MG tablet Take 1 tablet (300 mg total) by mouth daily. 30 tablet 3   bictegravir-emtricitabine-tenofovir AF (BIKTARVY) 50-200-25 MG TABS tablet TAKE 1 TABLET BY MOUTH 1 TIME A DAY. 30 tablet 5   carvedilol (COREG) 6.25 MG tablet Take 1 tablet (6.25 mg total) by mouth 2 (two) times daily with a meal. 60 tablet 1   dicyclomine (BENTYL) 10 MG capsule      docusate sodium (COLACE) 100 MG capsule Take 100 mg by mouth daily as needed for mild constipation.     hydrALAZINE (APRESOLINE) 50 MG tablet Take 1 tablet (50 mg total) by mouth 3 (three) times daily. 90 tablet 1   isosorbide mononitrate (IMDUR) 30 MG 24 hr tablet Take 30 mg by mouth daily.     losartan (COZAAR) 50 MG tablet Take 1 tablet (50 mg total) by mouth daily. 90 tablet 1   oxyCODONE (OXY IR/ROXICODONE) 5 MG immediate release tablet Take 1 tablet (5 mg total) by mouth every 12 (twelve) hours as needed for severe pain. 30 tablet 0   potassium chloride (KLOR-CON) 20 MEQ tablet Take 0.5 tablets (10 mEq total) by mouth daily. 30 tablet 1   sulfamethoxazole-trimethoprim (BACTRIM DS) 800-160 MG tablet TAKE 1 TABLET BY MOUTH DAILY. 30 tablet 5   furosemide (LASIX) 40 MG tablet Take 1 tablet (40 mg total) by mouth daily. 30 tablet 3   lidocaine-prilocaine (EMLA) cream Apply 1 application topically as needed. (Patient not taking: Reported on 07/01/2021) 30 g 0   ondansetron (ZOFRAN-ODT) 4 MG disintegrating tablet  (Patient not taking: Reported on 07/01/2021)     prochlorperazine (COMPAZINE) 10 MG tablet Take 1 tablet (10 mg total) by mouth every 6  (six) hours as needed for nausea or vomiting. (Patient not taking: Reported on 07/01/2021) 30 tablet 2   No current facility-administered medications for this visit.   Facility-Administered Medications Ordered in Other Visits  Medication Dose Route Frequency Provider Last Rate Last Admin   0.9 %  sodium chloride infusion   Intravenous Continuous Derek Jack, MD       0.9 %  sodium chloride infusion   Intravenous Continuous Derek Jack, MD   Stopped at 09/26/20 1124   0.9 %  sodium chloride infusion  Intravenous Continuous Derek Jack, MD 20 mL/hr at 11/04/20 0934 New Bag at 11/04/20 0934   0.9 %  sodium chloride infusion   Intravenous Continuous Derek Jack, MD 500 mL/hr at 11/04/20 0935 Bolus from Bag at 11/04/20 0935   0.9 %  sodium chloride infusion   Intravenous Continuous Derek Jack, MD   Stopped at 11/07/20 1110   0.9 %  sodium chloride infusion   Intravenous Continuous Derek Jack, MD   Stopped at 12/25/20 1117   0.9 %  sodium chloride infusion   Intravenous Continuous Derek Jack, MD       sodium chloride flush (NS) 0.9 % injection 10 mL  10 mL Intravenous PRN Derek Jack, MD   10 mL at 10/15/20 1035   sodium chloride flush (NS) 0.9 % injection 10 mL  10 mL Intracatheter PRN Derek Jack, MD   10 mL at 11/04/20 0800   sodium chloride flush (NS) 0.9 % injection 10 mL  10 mL Intravenous PRN Derek Jack, MD   10 mL at 11/07/20 1046    ALLERGIES:  Allergies  Allergen Reactions   Neosporin [Neomycin-Bacitracin Zn-Polymyx] Rash    PHYSICAL EXAM:  Performance status (ECOG): 0 - Asymptomatic  Vitals:   07/01/21 1043  BP: (!) 141/93  Pulse: 87  Resp: 18  Temp: (!) 97.2 F (36.2 C)  SpO2: 96%   Wt Readings from Last 3 Encounters:  07/01/21 166 lb 12.8 oz (75.7 kg)  06/19/21 172 lb (78 kg)  03/25/21 173 lb 14.4 oz (78.9 kg)   Physical Exam Vitals reviewed.  Constitutional:       Appearance: Normal appearance.  Cardiovascular:     Rate and Rhythm: Normal rate and regular rhythm.     Pulses: Normal pulses.     Heart sounds: Normal heart sounds.  Pulmonary:     Effort: Pulmonary effort is normal.     Breath sounds: Normal breath sounds.  Abdominal:     Palpations: Abdomen is soft. There is no hepatomegaly, splenomegaly or mass.     Tenderness: There is no abdominal tenderness.  Musculoskeletal:     Right lower leg: No edema.     Left lower leg: No edema.  Lymphadenopathy:     Cervical: No cervical adenopathy.     Right cervical: No superficial cervical adenopathy.    Left cervical: No superficial cervical adenopathy.     Upper Body:     Right upper body: No supraclavicular adenopathy.     Left upper body: No supraclavicular adenopathy.     Lower Body: No right inguinal adenopathy. No left inguinal adenopathy.  Neurological:     General: No focal deficit present.     Mental Status: He is alert and oriented to person, place, and time.  Psychiatric:        Mood and Affect: Mood normal.        Behavior: Behavior normal.    LABORATORY DATA:  I have reviewed the labs as listed.  CBC Latest Ref Rng & Units 05/16/2021 04/10/2021 01/14/2021  WBC 4.0 - 10.5 K/uL 4.5 5.0 5.7  Hemoglobin 13.0 - 17.0 g/dL 13.5 12.8(L) 11.0(L)  Hematocrit 39.0 - 52.0 % 40.5 39.1 34.3(L)  Platelets 150 - 400 K/uL 123(L) 115(L) 132(L)   CMP Latest Ref Rng & Units 05/16/2021 04/10/2021 01/27/2021  Glucose 70 - 99 mg/dL 119(H) 145(H) 91  BUN 6 - 20 mg/dL 21(H) 18 17  Creatinine 0.61 - 1.24 mg/dL 1.23 1.26(H) 0.91  Sodium 135 - 145 mmol/L  139 138 140  Potassium 3.5 - 5.1 mmol/L 3.8 3.6 4.0  Chloride 98 - 111 mmol/L 107 105 108  CO2 22 - 32 mmol/L '26 22 22  ' Calcium 8.9 - 10.3 mg/dL 9.3 9.0 8.9  Total Protein 6.5 - 8.1 g/dL 7.5 7.2 6.8  Total Bilirubin 0.3 - 1.2 mg/dL 0.3 0.4 0.4  Alkaline Phos 38 - 126 U/L 68 71 -  AST 15 - 41 U/L '22 23 17  ' ALT 0 - 44 U/L '14 13 11      ' Component Value  Date/Time   RBC 4.48 05/16/2021 0926   MCV 90.4 05/16/2021 0926   MCH 30.1 05/16/2021 0926   MCHC 33.3 05/16/2021 0926   RDW 17.3 (H) 05/16/2021 0926   LYMPHSABS 0.9 05/16/2021 0926   MONOABS 0.2 05/16/2021 0926   EOSABS 0.1 05/16/2021 0926   BASOSABS 0.0 05/16/2021 0926    DIAGNOSTIC IMAGING:  I have independently reviewed the scans and discussed with the patient. NM PET Image Restage (PS) Skull Base to Thigh  Result Date: 06/27/2021 CLINICAL DATA:  Subsequent treatment strategy for high-grade B-cell lymphoma. EXAM: NUCLEAR MEDICINE PET SKULL BASE TO THIGH TECHNIQUE: 7.48 mCi F-18 FDG was injected intravenously. Full-ring PET imaging was performed from the skull base to thigh after the radiotracer. CT data was obtained and used for attenuation correction and anatomic localization. Fasting blood glucose: 134 mg/dl COMPARISON:  Multiple priors including most recent PET-CT Mar 17, 2021 FINDINGS: Mediastinal blood pool activity: SUV max 1.7 Liver activity: SUV max 2.5 NECK: No hypermetabolic lymph nodes in the neck. Incidental CT findings: none CHEST: The previously visualized nodular hypermetabolic pre-vascular focus now appears to represent a chevron shaped hypermetabolic soft tissue density anterior mediastinum, favored represent thymic rebound tissue with a max SUV of 3.7. Minimally metabolic 6 mm right upper lobe pulmonary nodule with a max SUV of 1.1. There are additional scattered tiny 2-3 mm pulmonary nodules for instance in the left upper lobe on image 124/7 and the right upper lobe on image 136/7, which do not demonstrate abnormal FDG avidity but are below the resolution of PET. No hypermetabolic mediastinal or hilar lymph nodes. Incidental CT findings: Right chest Port-A-Cath with tip at the superior cavoatrial junction. Similar left-sided cardiac enlargement. Aortic atherosclerosis without aneurysmal dilation. ABDOMEN/PELVIS: Decreased hypermetabolic activity with unchanged size of the left  upper quadrant nodal mass measuring 2.3 cm in short axis on image 174/3 now demonstrating some internal calcifications with a max SUV of 3.2 previously 6.1. Otherwise no suspicious hypermetabolic abdominopelvic lymphadenopathy. Normal size spleen.  Reference splenic max SUV is 2.1. No abnormal hypermetabolism in the liver, pancreas or adrenal glands. Incidental CT findings: Motion degraded examination. Unremarkable noncontrast appearance of the liver, pancreas, spleen, kidneys and adrenal glands. No evidence of bowel obstruction. Aortic atherosclerosis without aneurysmal dilation. SKELETON: No focal hypermetabolic activity to suggest skeletal metastasis. Incidental CT findings: none IMPRESSION: 1. Similar size with decreased abnormal metabolic activity in the left upper abdominal nodal conglomerate, which now demonstrates some internal calcifications. Likely reflecting treatment response. (Deauville 4). 2. New minimally metabolic 6 mm right upper lobe pulmonary nodule with scattered 2-3 mm bilateral pulmonary nodules which are below the resolution of PET. Nonspecific but likely infectious or inflammatory. Attention on follow-up imaging suggested. (Deauville X) 3. Chevron shaped hypermetabolic soft tissue density anterior mediastinum, favored represent thymic rebound. 4. No new hypermetabolic lymphadenopathy in the neck, chest, abdomen or pelvis. Electronically Signed   By: Dahlia Bailiff M.D.   On: 06/27/2021 10:43  ASSESSMENT:  1.  B-cell high-grade lymphoma: -Presentation to the ER with left upper quadrant and mid abdominal pain for 2 weeks. -10 pound weight loss in the last 2 weeks due to decreased appetite.  Denies any fevers or night sweats. -CT AP with contrast on 07/24/2020 shows 13.8 11.2 cm mass in the epigastric region, lobulated large mass in the mesenteric region, 9.3 x 5.1 cm mass in the inferior pelvis, 9.7 x 4.1 cm mass in the superior pelvis.  Epigastric mass seems to abut posterior  spleen. -LDH was elevated at 309, uric acid 10.2. -PET scan on 07/31/2020 showed extensive multifocal soft tissue masses within the abdomen and pelvis, most of them between SUV 15 and 20.  FDG avid subpleural nodules overlying the right lung.  Splenomegaly.  Multiple scattered lung nodules, nonspecific. -Biopsy of the soft tissue mass in the upper abdomen on 08/15/2020 consistent with high-grade B-cell lymphoma.  IHC positive for CD20, CD10, BCL6, negative for BCL-2, CD3, CD5, CD30, mum 1 and EBV.  Ki-67 is 80-90%. 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 -He was evaluated by Dr. Lonia Blood at [facility] and was not felt to be a candidate for clinical trial. -6 cycles of R-EPOCH from 08/28/2020 through 12/23/2020. -PET scan on 10/28/2020 showed marked interval response with most of the sites of hypermetabolic disease with no residual hypermetabolism.  One of the more dominant foci of disease in the left upper quadrant of the abdomen does show low-level of hypermetabolism compatible with Deauville 4.  No new hypermetabolic disease.  Persistent splenomegaly without hypermetabolism in the parenchyma. - PET scan on 01/07/2021 Left upper quadrant lesion posterior to the stomach measures slightly smaller, at 3.2 x 3.1 cm compared to 4.5 x 3.5 cm previously.  This has SUV max of 4.4 compared to 4.5 previously.  Similar appearance of splenomegaly without hypermetabolism.  No new sites of suspicious hypermetabolic disease identified. - PET scan on 03/17/2021 shows 2.3 cm nodal mass in the left upper abdomen, grossly unchanged, SUV 6.1, previously 4.4.  11 mm short axis prevascular node, maximum SUV 3.1.     2.  Social/family history: -He quit smoking last year. -Family history significant for mother and father who had multiple myeloma.  Grandfather had colon cancer.   3.  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   4.   CHF: -Echocardiogram on 09/16/2020 with EF 50%. -Echocardiogram on 08/22/2020 with EF 45-50%. -Echocardiogram on 09/18/2019 shows EF 40-45%. -He is on Coreg 6.25 mg twice daily, Imdur 30 mg daily.  Lasix 40 mg is an expired medication list.   PLAN:  1.  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  No palpable adenopathy or abdominal masses. - Reviewed PET scan from 06/27/2021 which showed similar size with decreased abnormal metabolic activity in the left upper abdominal nodal conglomerate, which now demonstrates some internal calcifications Spleen is normal in size.  No other masses. - reviewed LDH and routine labs from 05/16/2021. No concerning abnormalities.  - RTC in 2 months time for continued close monitoring .   2.  Abdominal pain: -He is not requiring oxycodone.   3.  Nausea: -Continue Zofran for intermittent nausea.   4. Neuropathy: -Constant numbness in the fingertips is stable.  This was vincristine induced.   5.  Hypokalemia: -Continue potassium 20 mEq daily.   6.  Hypertension: -  Continue losartan,  hydralazine and Coreg.  Orders placed this encounter:  No orders of the defined types were placed in this encounter.  Ledell Peoples, MD Department of Hematology/Oncology [place] [facility] Phone: [phone] Pager: [phone] Email: [email or web address]

## 2021-07-16 ENCOUNTER — Encounter (HOSPITAL_COMMUNITY): Payer: Self-pay | Admitting: Hematology

## 2021-07-17 ENCOUNTER — Encounter (HOSPITAL_COMMUNITY): Payer: Self-pay | Admitting: Hematology

## 2021-08-24 ENCOUNTER — Inpatient Hospital Stay (HOSPITAL_COMMUNITY): Payer: 59

## 2021-08-24 ENCOUNTER — Inpatient Hospital Stay (HOSPITAL_COMMUNITY)
Admission: EM | Admit: 2021-08-24 | Discharge: 2021-08-27 | DRG: 974 | Disposition: A | Discharge status: Home / Self Care | Payer: 59 | Attending: Internal Medicine | Admitting: Internal Medicine

## 2021-08-24 ENCOUNTER — Encounter (HOSPITAL_COMMUNITY): Payer: Self-pay | Admitting: Emergency Medicine

## 2021-08-24 ENCOUNTER — Other Ambulatory Visit: Payer: Self-pay

## 2021-08-24 ENCOUNTER — Emergency Department (HOSPITAL_COMMUNITY): Payer: 59

## 2021-08-24 DIAGNOSIS — I5043 Acute on chronic combined systolic (congestive) and diastolic (congestive) heart failure: Secondary | ICD-10-CM | POA: Diagnosis present

## 2021-08-24 DIAGNOSIS — R7989 Other specified abnormal findings of blood chemistry: Secondary | ICD-10-CM | POA: Diagnosis present

## 2021-08-24 DIAGNOSIS — I422 Other hypertrophic cardiomyopathy: Secondary | ICD-10-CM | POA: Diagnosis present

## 2021-08-24 DIAGNOSIS — A419 Sepsis, unspecified organism: Principal | ICD-10-CM | POA: Diagnosis present

## 2021-08-24 DIAGNOSIS — I5021 Acute systolic (congestive) heart failure: Secondary | ICD-10-CM | POA: Diagnosis not present

## 2021-08-24 DIAGNOSIS — Z09 Encounter for follow-up examination after completed treatment for conditions other than malignant neoplasm: Secondary | ICD-10-CM

## 2021-08-24 DIAGNOSIS — I130 Hypertensive heart and chronic kidney disease with heart failure and stage 1 through stage 4 chronic kidney disease, or unspecified chronic kidney disease: Secondary | ICD-10-CM | POA: Diagnosis present

## 2021-08-24 DIAGNOSIS — Z20822 Contact with and (suspected) exposure to covid-19: Secondary | ICD-10-CM | POA: Diagnosis present

## 2021-08-24 DIAGNOSIS — Z0189 Encounter for other specified special examinations: Secondary | ICD-10-CM

## 2021-08-24 DIAGNOSIS — Z9911 Dependence on respirator [ventilator] status: Secondary | ICD-10-CM | POA: Diagnosis not present

## 2021-08-24 DIAGNOSIS — J9602 Acute respiratory failure with hypercapnia: Secondary | ICD-10-CM | POA: Diagnosis present

## 2021-08-24 DIAGNOSIS — J810 Acute pulmonary edema: Secondary | ICD-10-CM

## 2021-08-24 DIAGNOSIS — F17210 Nicotine dependence, cigarettes, uncomplicated: Secondary | ICD-10-CM | POA: Diagnosis present

## 2021-08-24 DIAGNOSIS — I5031 Acute diastolic (congestive) heart failure: Secondary | ICD-10-CM | POA: Diagnosis not present

## 2021-08-24 DIAGNOSIS — E872 Acidosis, unspecified: Secondary | ICD-10-CM | POA: Diagnosis present

## 2021-08-24 DIAGNOSIS — D649 Anemia, unspecified: Secondary | ICD-10-CM | POA: Diagnosis present

## 2021-08-24 DIAGNOSIS — R9431 Abnormal electrocardiogram [ECG] [EKG]: Secondary | ICD-10-CM

## 2021-08-24 DIAGNOSIS — Z8572 Personal history of non-Hodgkin lymphomas: Secondary | ICD-10-CM | POA: Diagnosis not present

## 2021-08-24 DIAGNOSIS — G9341 Metabolic encephalopathy: Secondary | ICD-10-CM | POA: Diagnosis present

## 2021-08-24 DIAGNOSIS — F1410 Cocaine abuse, uncomplicated: Secondary | ICD-10-CM | POA: Diagnosis present

## 2021-08-24 DIAGNOSIS — R1110 Vomiting, unspecified: Secondary | ICD-10-CM

## 2021-08-24 DIAGNOSIS — Z9114 Patient's other noncompliance with medication regimen: Secondary | ICD-10-CM

## 2021-08-24 DIAGNOSIS — J690 Pneumonitis due to inhalation of food and vomit: Secondary | ICD-10-CM | POA: Diagnosis present

## 2021-08-24 DIAGNOSIS — Z978 Presence of other specified devices: Secondary | ICD-10-CM

## 2021-08-24 DIAGNOSIS — R778 Other specified abnormalities of plasma proteins: Secondary | ICD-10-CM

## 2021-08-24 DIAGNOSIS — N179 Acute kidney failure, unspecified: Secondary | ICD-10-CM | POA: Diagnosis present

## 2021-08-24 DIAGNOSIS — E876 Hypokalemia: Secondary | ICD-10-CM | POA: Diagnosis present

## 2021-08-24 DIAGNOSIS — N390 Urinary tract infection, site not specified: Secondary | ICD-10-CM | POA: Diagnosis not present

## 2021-08-24 DIAGNOSIS — R509 Fever, unspecified: Secondary | ICD-10-CM

## 2021-08-24 DIAGNOSIS — Z8249 Family history of ischemic heart disease and other diseases of the circulatory system: Secondary | ICD-10-CM

## 2021-08-24 DIAGNOSIS — F1910 Other psychoactive substance abuse, uncomplicated: Secondary | ICD-10-CM | POA: Diagnosis not present

## 2021-08-24 DIAGNOSIS — J189 Pneumonia, unspecified organism: Secondary | ICD-10-CM | POA: Diagnosis present

## 2021-08-24 DIAGNOSIS — Z01818 Encounter for other preprocedural examination: Secondary | ICD-10-CM

## 2021-08-24 DIAGNOSIS — I10 Essential (primary) hypertension: Secondary | ICD-10-CM | POA: Diagnosis not present

## 2021-08-24 DIAGNOSIS — R6520 Severe sepsis without septic shock: Secondary | ICD-10-CM

## 2021-08-24 DIAGNOSIS — Z4659 Encounter for fitting and adjustment of other gastrointestinal appliance and device: Secondary | ICD-10-CM

## 2021-08-24 DIAGNOSIS — I5042 Chronic combined systolic (congestive) and diastolic (congestive) heart failure: Secondary | ICD-10-CM

## 2021-08-24 DIAGNOSIS — F419 Anxiety disorder, unspecified: Secondary | ICD-10-CM | POA: Diagnosis present

## 2021-08-24 DIAGNOSIS — I161 Hypertensive emergency: Secondary | ICD-10-CM | POA: Diagnosis present

## 2021-08-24 DIAGNOSIS — B20 Human immunodeficiency virus [HIV] disease: Secondary | ICD-10-CM | POA: Diagnosis present

## 2021-08-24 DIAGNOSIS — N182 Chronic kidney disease, stage 2 (mild): Secondary | ICD-10-CM | POA: Diagnosis present

## 2021-08-24 DIAGNOSIS — J9601 Acute respiratory failure with hypoxia: Secondary | ICD-10-CM | POA: Diagnosis present

## 2021-08-24 DIAGNOSIS — Z79899 Other long term (current) drug therapy: Secondary | ICD-10-CM

## 2021-08-24 DIAGNOSIS — Z9221 Personal history of antineoplastic chemotherapy: Secondary | ICD-10-CM | POA: Diagnosis not present

## 2021-08-24 DIAGNOSIS — I509 Heart failure, unspecified: Secondary | ICD-10-CM

## 2021-08-24 DIAGNOSIS — I248 Other forms of acute ischemic heart disease: Secondary | ICD-10-CM | POA: Diagnosis present

## 2021-08-24 DIAGNOSIS — R0602 Shortness of breath: Secondary | ICD-10-CM | POA: Diagnosis not present

## 2021-08-24 DIAGNOSIS — I5023 Acute on chronic systolic (congestive) heart failure: Secondary | ICD-10-CM | POA: Diagnosis not present

## 2021-08-24 HISTORY — DX: Presence of other specified devices: Z97.8

## 2021-08-24 HISTORY — DX: Acute respiratory failure with hypoxia: J96.01

## 2021-08-24 LAB — BLOOD GAS, ARTERIAL
Acid-base deficit: 2.5 mmol/L — ABNORMAL HIGH (ref 0.0–2.0)
Acid-base deficit: 1.6 mmol/L (ref 0.0–2.0)
Bicarbonate: 21.4 mmol/L (ref 20.0–28.0)
Bicarbonate: 22.8 mmol/L (ref 20.0–28.0)
FIO2: 100
FIO2: 70
O2 Saturation: 95.7 %
O2 Saturation: 95.9 %
Patient temperature: 38.2
Patient temperature: 37.6
pCO2 arterial: 57.7 mmHg — ABNORMAL HIGH (ref 32.0–48.0)
pCO2 arterial: 44 mmHg (ref 32.0–48.0)
pH, Arterial: 7.243 — ABNORMAL LOW (ref 7.350–7.450)
pH, Arterial: 7.344 — ABNORMAL LOW (ref 7.350–7.450)
pO2, Arterial: 108 mmHg (ref 83.0–108.0)
pO2, Arterial: 96.4 mmHg (ref 83.0–108.0)

## 2021-08-24 LAB — URINALYSIS, ROUTINE W REFLEX MICROSCOPIC
Bilirubin Urine: NEGATIVE
Glucose, UA: 50 mg/dL — AB
Ketones, ur: NEGATIVE mg/dL
Leukocytes,Ua: NEGATIVE
Nitrite: NEGATIVE
Protein, ur: 100 mg/dL — AB
Specific Gravity, Urine: 1.012 (ref 1.005–1.030)
pH: 5 (ref 5.0–8.0)

## 2021-08-24 LAB — GLUCOSE, CAPILLARY
Glucose-Capillary: 100 mg/dL — ABNORMAL HIGH (ref 70–99)
Glucose-Capillary: 102 mg/dL — ABNORMAL HIGH (ref 70–99)
Glucose-Capillary: 86 mg/dL (ref 70–99)

## 2021-08-24 LAB — RESP PANEL BY RT-PCR (FLU A&B, COVID) ARPGX2
Influenza A by PCR: NEGATIVE
Influenza B by PCR: NEGATIVE
SARS Coronavirus 2 by RT PCR: NEGATIVE

## 2021-08-24 LAB — CBG MONITORING, ED: Glucose-Capillary: 240 mg/dL — ABNORMAL HIGH (ref 70–99)

## 2021-08-24 LAB — COMPREHENSIVE METABOLIC PANEL
ALT: 22 U/L (ref 0–44)
ALT: 19 U/L (ref 0–44)
AST: 34 U/L (ref 15–41)
AST: 30 U/L (ref 15–41)
Albumin: 3.9 g/dL (ref 3.5–5.0)
Albumin: 3.4 g/dL — ABNORMAL LOW (ref 3.5–5.0)
Alkaline Phosphatase: 96 U/L (ref 38–126)
Alkaline Phosphatase: 77 U/L (ref 38–126)
Anion gap: 9 (ref 5–15)
Anion gap: 6 (ref 5–15)
BUN: 19 mg/dL (ref 6–20)
BUN: 22 mg/dL — ABNORMAL HIGH (ref 6–20)
CO2: 24 mmol/L (ref 22–32)
CO2: 25 mmol/L (ref 22–32)
Calcium: 8.4 mg/dL — ABNORMAL LOW (ref 8.9–10.3)
Calcium: 8 mg/dL — ABNORMAL LOW (ref 8.9–10.3)
Chloride: 103 mmol/L (ref 98–111)
Chloride: 107 mmol/L (ref 98–111)
Creatinine, Ser: 1.32 mg/dL — ABNORMAL HIGH (ref 0.61–1.24)
Creatinine, Ser: 1.5 mg/dL — ABNORMAL HIGH (ref 0.61–1.24)
GFR, Estimated: >60 mL/min (ref >60)
GFR, Estimated: >60 mL/min (ref >60)
Glucose, Bld: 249 mg/dL — ABNORMAL HIGH (ref 70–99)
Glucose, Bld: 83 mg/dL (ref 70–99)
Potassium: 3.4 mmol/L — ABNORMAL LOW (ref 3.5–5.1)
Potassium: 4.2 mmol/L (ref 3.5–5.1)
Sodium: 136 mmol/L (ref 135–145)
Sodium: 138 mmol/L (ref 135–145)
Total Bilirubin: 0.7 mg/dL (ref 0.3–1.2)
Total Bilirubin: 0.6 mg/dL (ref 0.3–1.2)
Total Protein: 7.6 g/dL (ref 6.5–8.1)
Total Protein: 6.3 g/dL — ABNORMAL LOW (ref 6.5–8.1)

## 2021-08-24 LAB — CBC WITH DIFFERENTIAL/PLATELET
Abs Immature Granulocytes: 0.11 10*3/uL — ABNORMAL HIGH (ref 0.00–0.07)
Basophils Absolute: 0.1 10*3/uL (ref 0.0–0.1)
Basophils Relative: 1 %
Eosinophils Absolute: 0.2 10*3/uL (ref 0.0–0.5)
Eosinophils Relative: 1 %
HCT: 41.8 % (ref 39.0–52.0)
Hemoglobin: 13.3 g/dL (ref 13.0–17.0)
Immature Granulocytes: 1 %
Lymphocytes Relative: 26 %
Lymphs Abs: 4.3 10*3/uL — ABNORMAL HIGH (ref 0.7–4.0)
MCH: 30.1 pg (ref 26.0–34.0)
MCHC: 31.8 g/dL (ref 30.0–36.0)
MCV: 94.6 fL (ref 80.0–100.0)
Monocytes Absolute: 0.8 10*3/uL (ref 0.1–1.0)
Monocytes Relative: 5 %
Neutro Abs: 11.1 10*3/uL — ABNORMAL HIGH (ref 1.7–7.7)
Neutrophils Relative %: 66 %
Platelets: 219 10*3/uL (ref 150–400)
RBC: 4.42 MIL/uL (ref 4.22–5.81)
RDW: 15.6 % — ABNORMAL HIGH (ref 11.5–15.5)
WBC: 16.6 10*3/uL — ABNORMAL HIGH (ref 4.0–10.5)
nRBC: 0 % (ref 0.0–0.2)

## 2021-08-24 LAB — LACTIC ACID, PLASMA
Lactic Acid, Venous: 3.8 mmol/L (ref 0.5–1.9)
Lactic Acid, Venous: 0.8 mmol/L (ref 0.5–1.9)
Lactic Acid, Venous: 0.8 mmol/L (ref 0.5–1.9)
Lactic Acid, Venous: 0.7 mmol/L (ref 0.5–1.9)

## 2021-08-24 LAB — CK: Total CK: 151 U/L (ref 49–397)

## 2021-08-24 LAB — TROPONIN I (HIGH SENSITIVITY)
Troponin I (High Sensitivity): 96 ng/L — ABNORMAL HIGH (ref <18)
Troponin I (High Sensitivity): 155 ng/L (ref <18)
Troponin I (High Sensitivity): 177 ng/L (ref <18)
Troponin I (High Sensitivity): 169 ng/L (ref <18)
Troponin I (High Sensitivity): 177 ng/L (ref <18)

## 2021-08-24 LAB — ECHOCARDIOGRAM COMPLETE
Area-P 1/2: 4.89 cm2
Height: 73 in
S' Lateral: 3.3 cm
Weight: 2680.79 oz

## 2021-08-24 LAB — CBC
HCT: 33.7 % — ABNORMAL LOW (ref 39.0–52.0)
Hemoglobin: 11.1 g/dL — ABNORMAL LOW (ref 13.0–17.0)
MCH: 30.5 pg (ref 26.0–34.0)
MCHC: 32.9 g/dL (ref 30.0–36.0)
MCV: 92.6 fL (ref 80.0–100.0)
Platelets: 122 10*3/uL — ABNORMAL LOW (ref 150–400)
RBC: 3.64 MIL/uL — ABNORMAL LOW (ref 4.22–5.81)
RDW: 15.5 % (ref 11.5–15.5)
WBC: 5.8 10*3/uL (ref 4.0–10.5)
nRBC: 0 % (ref 0.0–0.2)

## 2021-08-24 LAB — BRAIN NATRIURETIC PEPTIDE: B Natriuretic Peptide: 1714 pg/mL — ABNORMAL HIGH (ref 0.0–100.0)

## 2021-08-24 LAB — PROCALCITONIN: Procalcitonin: 6.97 ng/mL

## 2021-08-24 LAB — RAPID URINE DRUG SCREEN, HOSP PERFORMED
Amphetamines: NOT DETECTED
Barbiturates: NOT DETECTED
Benzodiazepines: NOT DETECTED
Cocaine: POSITIVE — AB
Opiates: NOT DETECTED
Tetrahydrocannabinol: NOT DETECTED

## 2021-08-24 LAB — TSH: TSH: 0.786 u[IU]/mL (ref 0.350–4.500)

## 2021-08-24 LAB — PHOSPHORUS: Phosphorus: 4.4 mg/dL (ref 2.5–4.6)

## 2021-08-24 LAB — MAGNESIUM
Magnesium: 1.9 mg/dL (ref 1.7–2.4)
Magnesium: 2 mg/dL (ref 1.7–2.4)

## 2021-08-24 LAB — HEPARIN LEVEL (UNFRACTIONATED): Heparin Unfractionated: <0.1 IU/mL — ABNORMAL LOW (ref 0.30–0.70)

## 2021-08-24 LAB — MRSA NEXT GEN BY PCR, NASAL: MRSA by PCR Next Gen: NOT DETECTED

## 2021-08-24 LAB — SAVE SMEAR(SSMR), FOR PROVIDER SLIDE REVIEW

## 2021-08-24 MED ORDER — PIPERACILLIN-TAZOBACTAM 3.375 G IVPB
3.3750 g | Freq: Once | INTRAVENOUS | Status: AC
  Administered 2021-08-24: 3.375 g via INTRAVENOUS
  Filled 2021-08-24: qty 50

## 2021-08-24 MED ORDER — CARVEDILOL 6.25 MG PO TABS: 6.2500 mg | ORAL_TABLET | Freq: Two times a day (BID) | ORAL | Status: DC

## 2021-08-24 MED ORDER — FUROSEMIDE 10 MG/ML IJ SOLN
INTRAMUSCULAR | Status: AC
  Administered 2021-08-24: 40 mg via INTRAVENOUS
  Filled 2021-08-24: qty 4

## 2021-08-24 MED ORDER — HEPARIN BOLUS VIA INFUSION
2000.0000 [IU] | Freq: Once | INTRAVENOUS | Status: AC
  Administered 2021-08-24: 2000 [IU] via INTRAVENOUS
  Filled 2021-08-24: qty 2000

## 2021-08-24 MED ORDER — HYDRALAZINE HCL 50 MG PO TABS: 50.0000 mg | ORAL_TABLET | Freq: Three times a day (TID) | ORAL | Status: DC

## 2021-08-24 MED ORDER — ASPIRIN 81 MG PO CHEW
81.0000 mg | CHEWABLE_TABLET | Freq: Every day | ORAL | Status: DC
  Administered 2021-08-25: 81 mg
  Filled 2021-08-24: qty 1

## 2021-08-24 MED ORDER — ACETAMINOPHEN 650 MG RE SUPP: 650.0000 mg | Freq: Four times a day (QID) | RECTAL | Status: DC | PRN

## 2021-08-24 MED ORDER — ALBUTEROL SULFATE (2.5 MG/3ML) 0.083% IN NEBU: 2.5000 mg | INHALATION_SOLUTION | RESPIRATORY_TRACT | Status: DC | PRN

## 2021-08-24 MED ORDER — NITROGLYCERIN IN D5W 200-5 MCG/ML-% IV SOLN
5.0000 ug/min | INTRAVENOUS | Status: DC
  Administered 2021-08-24: 5 ug/min via INTRAVENOUS
  Filled 2021-08-24: qty 250

## 2021-08-24 MED ORDER — BICTEGRAVIR-EMTRICITAB-TENOFOV 50-200-25 MG PO TABS
1.0000 | ORAL_TABLET | Freq: Every day | ORAL | Status: DC
  Administered 2021-08-24 – 2021-08-27 (×4): 1 via ORAL
  Filled 2021-08-24 (×4): qty 1

## 2021-08-24 MED ORDER — ASPIRIN 325 MG PO TABS: 325.0000 mg | ORAL_TABLET | Freq: Once | ORAL | Status: DC

## 2021-08-24 MED ORDER — ASPIRIN 81 MG PO CHEW
324.0000 mg | CHEWABLE_TABLET | Freq: Once | ORAL | Status: AC
  Administered 2021-08-24: 324 mg
  Filled 2021-08-24: qty 4

## 2021-08-24 MED ORDER — ACETAMINOPHEN 325 MG PO TABS: 650.0000 mg | ORAL_TABLET | Freq: Four times a day (QID) | ORAL | Status: DC | PRN

## 2021-08-24 MED ORDER — PROSOURCE TF PO LIQD
45.0000 mL | Freq: Two times a day (BID) | ORAL | Status: DC
  Administered 2021-08-24 (×2): 45 mL
  Filled 2021-08-24 (×2): qty 45

## 2021-08-24 MED ORDER — VANCOMYCIN HCL IN DEXTROSE 1-5 GM/200ML-% IV SOLN
1000.0000 mg | Freq: Once | INTRAVENOUS | Status: AC
  Administered 2021-08-24: 1000 mg via INTRAVENOUS
  Filled 2021-08-24: qty 200

## 2021-08-24 MED ORDER — FENTANYL BOLUS VIA INFUSION
50.0000 ug | INTRAVENOUS | Status: DC | PRN
  Administered 2021-08-24 (×2): 50 ug via INTRAVENOUS
  Filled 2021-08-24: qty 100

## 2021-08-24 MED ORDER — MORPHINE SULFATE (PF) 2 MG/ML IV SOLN: 2.0000 mg | INTRAVENOUS | Status: DC | PRN

## 2021-08-24 MED ORDER — POLYETHYLENE GLYCOL 3350 17 G PO PACK
17.0000 g | PACK | Freq: Every day | ORAL | Status: DC
  Administered 2021-08-24 – 2021-08-25 (×2): 17 g
  Filled 2021-08-24 (×2): qty 1

## 2021-08-24 MED ORDER — LINEZOLID 600 MG/300ML IV SOLN
600.0000 mg | Freq: Two times a day (BID) | INTRAVENOUS | Status: DC
  Administered 2021-08-24: 600 mg via INTRAVENOUS
  Filled 2021-08-24 (×2): qty 300

## 2021-08-24 MED ORDER — CHLORHEXIDINE GLUCONATE CLOTH 2 % EX PADS
6.0000 | MEDICATED_PAD | Freq: Every day | CUTANEOUS | Status: DC
  Administered 2021-08-24 – 2021-08-27 (×4): 6 via TOPICAL

## 2021-08-24 MED ORDER — PROPOFOL 1000 MG/100ML IV EMUL
0.0000 ug/kg/min | INTRAVENOUS | Status: DC
  Administered 2021-08-24: 20 ug/kg/min via INTRAVENOUS
  Administered 2021-08-24: 5 ug/kg/min via INTRAVENOUS
  Filled 2021-08-24 (×2): qty 100

## 2021-08-24 MED ORDER — ONDANSETRON HCL 4 MG/2ML IJ SOLN
4.0000 mg | Freq: Four times a day (QID) | INTRAMUSCULAR | Status: DC | PRN
Start: 2021-08-24 — End: 2021-08-24
  Administered 2021-08-24: 4 mg via INTRAVENOUS
  Filled 2021-08-24: qty 2

## 2021-08-24 MED ORDER — VITAL HIGH PROTEIN PO LIQD
1000.0000 mL | ORAL | Status: DC
  Administered 2021-08-24: 1000 mL

## 2021-08-24 MED ORDER — ONDANSETRON HCL 4 MG PO TABS: 4.0000 mg | ORAL_TABLET | Freq: Four times a day (QID) | ORAL | Status: DC | PRN

## 2021-08-24 MED ORDER — ASPIRIN EC 81 MG PO TBEC: 81.0000 mg | DELAYED_RELEASE_TABLET | Freq: Every day | ORAL | Status: DC

## 2021-08-24 MED ORDER — SODIUM CHLORIDE 0.9 % IV SOLN
2.0000 g | Freq: Three times a day (TID) | INTRAVENOUS | Status: DC
  Administered 2021-08-24 – 2021-08-27 (×9): 2 g via INTRAVENOUS
  Filled 2021-08-24 (×9): qty 2

## 2021-08-24 MED ORDER — DOCUSATE SODIUM 50 MG/5ML PO LIQD
100.0000 mg | Freq: Two times a day (BID) | ORAL | Status: DC
  Administered 2021-08-24 – 2021-08-25 (×3): 100 mg
  Filled 2021-08-24 (×3): qty 10

## 2021-08-24 MED ORDER — PROPOFOL 1000 MG/100ML IV EMUL
5.0000 ug/kg/min | INTRAVENOUS | Status: DC
  Administered 2021-08-24: 5 ug/kg/min via INTRAVENOUS
  Filled 2021-08-24: qty 100

## 2021-08-24 MED ORDER — FENTANYL CITRATE PF 50 MCG/ML IJ SOSY: 50.0000 ug | PREFILLED_SYRINGE | INTRAMUSCULAR | Status: DC | PRN

## 2021-08-24 MED ORDER — FENTANYL 2500MCG IN NS 250ML (10MCG/ML) PREMIX INFUSION: 0.0000 ug/h | INTRAVENOUS | Status: DC

## 2021-08-24 MED ORDER — LORAZEPAM 2 MG/ML IJ SOLN
2.0000 mg | Freq: Once | INTRAMUSCULAR | Status: AC
  Administered 2021-08-24: 2 mg via INTRAVENOUS

## 2021-08-24 MED ORDER — LINEZOLID 600 MG/300ML IV SOLN
600.0000 mg | Freq: Two times a day (BID) | INTRAVENOUS | Status: DC
  Filled 2021-08-24 (×3): qty 300

## 2021-08-24 MED ORDER — CHLORHEXIDINE GLUCONATE 0.12% ORAL RINSE (MEDLINE KIT)
15.0000 mL | Freq: Two times a day (BID) | OROMUCOSAL | Status: DC
  Administered 2021-08-24 – 2021-08-25 (×2): 15 mL via OROMUCOSAL

## 2021-08-24 MED ORDER — CARVEDILOL 3.125 MG PO TABS: 3.1250 mg | ORAL_TABLET | Freq: Two times a day (BID) | ORAL | Status: DC

## 2021-08-24 MED ORDER — ONDANSETRON HCL 4 MG/2ML IJ SOLN
4.0000 mg | Freq: Four times a day (QID) | INTRAMUSCULAR | Status: DC | PRN
  Administered 2021-08-24: 4 mg via INTRAVENOUS
  Filled 2021-08-24: qty 2

## 2021-08-24 MED ORDER — CALCIUM GLUCONATE-NACL 1-0.675 GM/50ML-% IV SOLN
1.0000 g | Freq: Once | INTRAVENOUS | Status: AC
  Administered 2021-08-24: 1000 mg via INTRAVENOUS
  Filled 2021-08-24: qty 50

## 2021-08-24 MED ORDER — ORAL CARE MOUTH RINSE
15.0000 mL | OROMUCOSAL | Status: DC
  Administered 2021-08-24 – 2021-08-25 (×5): 15 mL via OROMUCOSAL

## 2021-08-24 MED ORDER — FENTANYL 2500MCG IN NS 250ML (10MCG/ML) PREMIX INFUSION
0.0000 ug/h | INTRAVENOUS | Status: DC
  Administered 2021-08-24: 50 ug/h via INTRAVENOUS
  Filled 2021-08-24 (×2): qty 250

## 2021-08-24 MED ORDER — HEPARIN BOLUS VIA INFUSION
4000.0000 [IU] | Freq: Once | INTRAVENOUS | Status: AC
  Administered 2021-08-24: 4000 [IU] via INTRAVENOUS

## 2021-08-24 MED ORDER — ACETAMINOPHEN 160 MG/5ML PO SOLN: 650.0000 mg | Freq: Four times a day (QID) | ORAL | Status: DC | PRN

## 2021-08-24 MED ORDER — NITROGLYCERIN IN D5W 200-5 MCG/ML-% IV SOLN
INTRAVENOUS | Status: AC
  Administered 2021-08-24: 50 ug/min via INTRAVENOUS
  Filled 2021-08-24: qty 250

## 2021-08-24 MED ORDER — FENTANYL CITRATE PF 50 MCG/ML IJ SOSY: 50.0000 ug | PREFILLED_SYRINGE | Freq: Once | INTRAMUSCULAR | Status: DC

## 2021-08-24 MED ORDER — ASPIRIN 325 MG PO TABS
325.0000 mg | ORAL_TABLET | Freq: Once | ORAL | Status: DC
  Filled 2021-08-24: qty 1

## 2021-08-24 MED ORDER — LACTATED RINGERS IV SOLN: INTRAVENOUS | Status: DC

## 2021-08-24 MED ORDER — FUROSEMIDE 10 MG/ML IJ SOLN
40.0000 mg | Freq: Once | INTRAMUSCULAR | Status: AC
  Administered 2021-08-24: 40 mg via INTRAVENOUS

## 2021-08-24 MED ORDER — FENTANYL 2500MCG IN NS 250ML (10MCG/ML) PREMIX INFUSION
50.0000 ug/h | INTRAVENOUS | Status: DC
  Administered 2021-08-24: 200 ug/h via INTRAVENOUS

## 2021-08-24 MED ORDER — FUROSEMIDE 10 MG/ML IJ SOLN: 40.0000 mg | Freq: Once | INTRAMUSCULAR | Status: AC

## 2021-08-24 MED ORDER — HEPARIN SODIUM (PORCINE) 5000 UNIT/ML IJ SOLN
5000.0000 [IU] | Freq: Three times a day (TID) | INTRAMUSCULAR | Status: DC
  Administered 2021-08-24: 5000 [IU] via SUBCUTANEOUS
  Filled 2021-08-24: qty 1

## 2021-08-24 MED ORDER — HEPARIN (PORCINE) 25000 UT/250ML-% IV SOLN
1550.0000 [IU]/h | INTRAVENOUS | Status: DC
  Administered 2021-08-24: 950 [IU]/h via INTRAVENOUS
  Administered 2021-08-25: 1550 [IU]/h via INTRAVENOUS
  Filled 2021-08-24 (×2): qty 250

## 2021-08-24 MED ORDER — LOSARTAN POTASSIUM 50 MG PO TABS: 50.0000 mg | ORAL_TABLET | Freq: Every day | ORAL | Status: DC

## 2021-08-24 MED ORDER — SODIUM CHLORIDE 0.9 % IV BOLUS
500.0000 mL | Freq: Once | INTRAVENOUS | Status: AC
  Administered 2021-08-24: 500 mL via INTRAVENOUS

## 2021-08-24 MED ORDER — POTASSIUM CHLORIDE 10 MEQ/100ML IV SOLN
10.0000 meq | INTRAVENOUS | Status: AC
  Administered 2021-08-24 (×2): 10 meq via INTRAVENOUS
  Filled 2021-08-24 (×2): qty 100

## 2021-08-24 NOTE — ED Triage Notes (Signed)
PT brought in from home with Respiratory Distress. EMS states pt's sats were in 60's on room air upon their arrival. Pt given 1 inch Nitro paste. PT with stg 3 lymphoma but not undergoing tx at this time per EMS. Pt very anxious upon arrival to ED.

## 2021-08-24 NOTE — Progress Notes (Signed)
ANTICOAGULATION CONSULT NOTE - Initial Consult  Pharmacy Consult for Heparin Indication: chest pain/ACS  Allergies  Allergen Reactions   Neosporin [Neomycin-Bacitracin Zn-Polymyx] Rash    Patient Measurements: Height: 6\' 1"  (185.4 cm) Weight: 76 kg (167 lb 8.8 oz) IBW/kg (Calculated) : 79.9 HEPARIN DW (KG): 76   Vital Signs: Temp: 100.2 F (37.9 C) (10/16 1009) BP: 115/67 (10/16 1009) Pulse Rate: 86 (10/16 1009)  Labs: Recent Labs    08/24/21 0155 08/24/21 0351 08/24/21 0458 08/24/21 0710 08/24/21 0946  HGB 13.3  --  11.1*  --   --   HCT 41.8  --  33.7*  --   --   PLT 219  --  122*  --   --   CREATININE 1.32*  --  1.50*  --   --   TROPONINIHS 96* 155*  --  177* 169*    Estimated Creatinine Clearance: 71.8 mL/min (A) (by C-G formula based on SCr of 1.5 mg/dL (H)).   Medical History: Past Medical History:  Diagnosis Date   CHF (congestive heart failure) ([place])    Essential hypertension    HIV infection ([place])    Noncompliance with medication regimen    Secondary cardiomyopathy ([place])     Medications:  See med rec  Assessment: Patient with elevated troponins, and concern for ACS. UDS was positive for cocaine.  Patient not on oral anticoagulants PTA. Pharmacy asked to anticoagulate with heparin  Goal of Therapy:  Heparin level 0.3-0.7 units/ml Monitor platelets by anticoagulation protocol: Yes   Plan:  Give 4000 units bolus x 1 Start heparin infusion at 950 units/hr Check anti-Xa level in ~6 hours and daily while on heparin Continue to monitor H&H and platelets  Isac Sarna, BS Vena [place], BCPS Clinical Pharmacist Pager [phone] 08/24/2021,11:22 AM

## 2021-08-24 NOTE — ED Notes (Signed)
[facility] arrives for the pt and it is noted that pt has had EKG changes. MD made aware

## 2021-08-24 NOTE — Progress Notes (Signed)
ANTICOAGULATION CONSULT NOTE   Pharmacy Consult for Heparin Indication: chest pain/ACS  Allergies  Allergen Reactions   Neosporin [Neomycin-Bacitracin Zn-Polymyx] Rash    Patient Measurements: Height: 6\' 1"  (185.4 cm) Weight: 76 kg (167 lb 8.8 oz) IBW/kg (Calculated) : 79.9 HEPARIN DW (KG): 76   Vital Signs: Temp: 99.3 F (37.4 C) (10/16 2000) Temp Source: Core (10/16 1330) BP: 119/81 (10/16 2000) Pulse Rate: 83 (10/16 2000)  Labs: Recent Labs    08/24/21 0155 08/24/21 0351 08/24/21 0458 08/24/21 0710 08/24/21 0946 08/24/21 1143 08/24/21 1855  HGB 13.3  --  11.1*  --   --   --   --   HCT 41.8  --  33.7*  --   --   --   --   PLT 219  --  122*  --   --   --   --   HEPARINUNFRC  --   --   --   --   --   --  <0.10*  CREATININE 1.32*  --  1.50*  --   --   --   --   CKTOTAL  --   --   --   --   --   --  151  TROPONINIHS 96*   < >  --  177* 169* 177*  --    < > = values in this interval not displayed.     Estimated Creatinine Clearance: 71.8 mL/min (A) (by C-G formula based on SCr of 1.5 mg/dL (H)).   Medical History: Past Medical History:  Diagnosis Date   CHF (congestive heart failure) ([place])    Essential hypertension    HIV infection ([place])    Noncompliance with medication regimen    Secondary cardiomyopathy ([place])     Assessment: Patient with elevated troponins, and concern for ACS. UDS was positive for cocaine.  Patient not on oral anticoagulants PTA. Pharmacy asked to anticoagulate with heparin.  Initial heparin level undetectable. No bleeding or infusion issues noted.   Goal of Therapy:  Heparin level 0.3-0.7 units/ml Monitor platelets by anticoagulation protocol: Yes   Plan:  Rebolus heparin 2000 units x 1 Increase heparin infusion to 1250 units/hr Check anti-Xa level in ~6 hours and daily while on heparin Continue to monitor H&H and platelets  Erin Hearing PharmD., BCPS Clinical Pharmacist 08/24/2021 8:57 PM

## 2021-08-24 NOTE — Progress Notes (Addendum)
RN called to room by patient. RN found patient to be vomiting what appeared to be tube feeds. RN d/c Tube Feeding and connected OG tube to low intermit suction. Tube Feeding noted in pt ET Tube. Appprox 252ml came out from OG Tube once connected to suctioning. RN called for C-Xray and KUB. Per Dr Prudencio Burly, hold tube feeds until AM.

## 2021-08-24 NOTE — H&P (Signed)
TRH H&P    Patient Demographics:    Richard Cox, is a 40 y.o. male  MRN: [id number]  DOB - [birth year]  Admit Date - 08/24/2021  Referring MD/NP/PA: Stark Jock  Outpatient Primary MD for the patient is Lindell Spar, MD  Patient coming from: Home  Chief complaint- dyspnea   HPI:    Richard Cox  is a 40 y.o. male, with history of CHF, hypertension, HIV infection, medical noncompliance, and cardiomyopathy, presents ED with chief complaint of dyspnea.  Patient is intubated at the time of my exam 0 history is taken from staff and charting.  Apparently patient called EMS for respiratory distress.  When EMS arrived patient's O2 sats were in the 60s.  He was placed on BiPAP when he got to the ED but was not able to tolerate it, was confused and taking the BiPAP off.  His blood pressure was 240/150 at arrival as well.  Patient had increased work of breathing.  He was started on nitro drip, Lasix, there was pulmonary edema and chest x-ray.  After intubation when patient was on propofol his blood pressure dropped and he developed fever as high as 101.1, but was 100.6 at admission.  He also had a lactic acidosis of 3.8.  He been given 80 mg of Lasix without much urine output, so there was not a concern the patient may be septic and needing fluids.  500 mL bolus was given.  Patient had positive UDS for cocaine.  Patient also had a UA that was from a catheterized sample, and indicative of UTI.  Urine culture pending.  Blood cultures pending.  Patient had leukocytosis of 16.6, hemoglobin 13.3.  Chemistry panel reveals an elevated creatinine that is close to patient's baseline at 1.3 to.  BNP is 1714.  Troponins are uptrending from 96, 255.  Patient's propofol was DC'd and fentanyl was started in the setting of hypotension.  He was given Ativan when they were trying to get him to tolerate the BiPAP, but ultimately that was not successful.   Patient was started on nitro drip, given Zosyn, vancomycin, and had initially been started on propofol as mentioned above.  Admission was requested for acute hypoxic respiratory failure with hypercapnia.  It is worth noting pH was 7.243 and PCO2 was 57 on the initial blood gas.    Review of systems:    Review of systems cannot be obtained secondary to patient being intubated    Past History of the following :    Past Medical History:  Diagnosis Date   CHF (congestive heart failure) ([place])    Essential hypertension    HIV infection ([place])    Noncompliance with medication regimen    Secondary cardiomyopathy ([place])       Past Surgical History:  Procedure Laterality Date   IR IMAGING GUIDED PORT INSERTION  08/23/2020   Right   NO PAST SURGERIES        Social History:      Social History   Tobacco Use   Smoking status: Light Smoker  Packs/day: 0.25    Types: Cigarettes   Smokeless tobacco: Never  Substance Use Topics   Alcohol use: Not Currently       Family History :     Family History  Problem Relation Age of Onset   Diabetes Mellitus II Mother    Cancer Mother        multiple myeloma   Cancer Father        multiple myeloma   Congestive Heart Failure Brother    Heart Problems Brother    Lupus Brother       Home Medications:   Prior to Admission medications   Medication Sig Start Date End Date Taking? Authorizing Provider  acetaminophen (TYLENOL) 500 MG tablet Take 1,000 mg by mouth every 6 (six) hours as needed for moderate pain or headache.    [provider]  allopurinol (ZYLOPRIM) 300 MG tablet Take 1 tablet (300 mg total) by mouth daily. 09/30/20   Derek Jack, MD  bictegravir-emtricitabine-tenofovir AF (BIKTARVY) 50-200-25 MG TABS tablet TAKE 1 TABLET BY MOUTH 1 TIME A DAY. 06/19/21   Golden Circle, FNP  carvedilol (COREG) 6.25 MG tablet Take 1 tablet (6.25 mg total) by mouth 2 (two) times daily with a meal. 02/25/21    Derek Jack, MD  dicyclomine (BENTYL) 10 MG capsule  07/30/20   [provider]  docusate sodium (COLACE) 100 MG capsule Take 100 mg by mouth daily as needed for mild constipation.    [provider]  furosemide (LASIX) 40 MG tablet Take 1 tablet (40 mg total) by mouth daily. 07/30/20 11/27/20  Lindell Spar, MD  hydrALAZINE (APRESOLINE) 50 MG tablet Take 1 tablet (50 mg total) by mouth 3 (three) times daily. 02/25/21   Derek Jack, MD  isosorbide mononitrate (IMDUR) 30 MG 24 hr tablet Take 30 mg by mouth daily. 04/30/20   [provider]  lidocaine-prilocaine (EMLA) cream Apply 1 application topically as needed. Patient not taking: Reported on 07/01/2021 08/28/20   Derek Jack, MD  losartan (COZAAR) 50 MG tablet Take 1 tablet (50 mg total) by mouth daily. 02/25/21   Derek Jack, MD  ondansetron (ZOFRAN-ODT) 4 MG disintegrating tablet  07/24/20   [provider]  oxyCODONE (OXY IR/ROXICODONE) 5 MG immediate release tablet Take 1 tablet (5 mg total) by mouth every 12 (twelve) hours as needed for severe pain. 05/22/21   Derek Jack, MD  potassium chloride (KLOR-CON) 20 MEQ tablet Take 0.5 tablets (10 mEq total) by mouth daily. 09/23/20   Derek Jack, MD  prochlorperazine (COMPAZINE) 10 MG tablet Take 1 tablet (10 mg total) by mouth every 6 (six) hours as needed for nausea or vomiting. Patient not taking: Reported on 07/01/2021 12/02/20   Derek Jack, MD     Allergies:     Allergies  Allergen Reactions   Neosporin [Neomycin-Bacitracin Zn-Polymyx] Rash     Physical Exam:   Vitals  Blood pressure 106/70, pulse 81, temperature 99.1 F (37.3 C), resp. rate (!) 22, height 6' 1" (1.854 m), weight 76 kg, SpO2 98 %. 1.  General: Patient lying supine in bed, on ventilator   2. Psychiatric: Cannot be assessed as patient is on ventilator   3. Neurologic: Cannot be assessed as patient is not able to follow  commands, sedated on ventilator.   4. HEENMT:  Head is atraumatic, normocephalic, pupils reactive to light, neck is supple, trachea is midline, mucous membranes are moist   5. Respiratory : Bilateral diffuse rales and wheezes, clubbing of  digits, currently on mechanical ventilation   6. Cardiovascular : Heart rate normal, rhythm is regular, no murmurs, rubs or gallops, no peripheral edema, peripheral pulses palpated   7. Gastrointestinal:  Abdomen is soft, nondistended, nontender to palpation bowel sounds active, no masses or organomegaly palpated   8. Skin:  Skin is warm, dry and intact without rashes, acute lesions, or ulcers on limited exam   9.Musculoskeletal:  No acute deformities or trauma, no asymmetry in tone, no peripheral edema, peripheral pulses palpated, no tenderness to palpation in the extremities     Data Review:    CBC Recent Labs  Lab 08/24/21 0155 08/24/21 0458  WBC 16.6* 5.8  HGB 13.3 11.1*  HCT 41.8 33.7*  PLT 219 122*  MCV 94.6 92.6  MCH 30.1 30.5  MCHC 31.8 32.9  RDW 15.6* 15.5  LYMPHSABS 4.3*  --   MONOABS 0.8  --   EOSABS 0.2  --   BASOSABS 0.1  --    ------------------------------------------------------------------------------------------------------------------  Results for orders placed or performed during the hospital encounter of 08/24/21 (from the past 48 hour(s))  CBG monitoring, ED     Status: Abnormal   Collection Time: 08/24/21  1:50 AM  Result Value Ref Range   Glucose-Capillary 240 (H) 70 - 99 mg/dL    Comment: Glucose reference range applies only to samples taken after fasting for at least 8 hours.  Comprehensive metabolic panel     Status: Abnormal   Collection Time: 08/24/21  1:55 AM  Result Value Ref Range   Sodium 136 135 - 145 mmol/L   Potassium 3.4 (L) 3.5 - 5.1 mmol/L   Chloride 103 98 - 111 mmol/L   CO2 24 22 - 32 mmol/L   Glucose, Bld 249 (H) 70 - 99 mg/dL    Comment: Glucose reference range applies only to  samples taken after fasting for at least 8 hours.   BUN 19 6 - 20 mg/dL   Creatinine, Ser 1.32 (H) 0.61 - 1.24 mg/dL   Calcium 8.4 (L) 8.9 - 10.3 mg/dL   Total Protein 7.6 6.5 - 8.1 g/dL   Albumin 3.9 3.5 - 5.0 g/dL   AST 34 15 - 41 U/L   ALT 22 0 - 44 U/L   Alkaline Phosphatase 96 38 - 126 U/L   Total Bilirubin 0.7 0.3 - 1.2 mg/dL   GFR, Estimated >60 >60 mL/min    Comment: (NOTE) Calculated using the CKD-EPI Creatinine Equation (2021)    Anion gap 9 5 - 15    Comment: Performed at [facility], [address], [place], [place] [address]  Brain natriuretic peptide     Status: Abnormal   Collection Time: 08/24/21  1:55 AM  Result Value Ref Range   B Natriuretic Peptide 1,714.0 (H) 0.0 - 100.0 pg/mL    Comment: Performed at [facility], [address], [place], [place] [address]  Troponin I (High Sensitivity)     Status: Abnormal   Collection Time: 08/24/21  1:55 AM  Result Value Ref Range   Troponin I (High Sensitivity) 96 (H) <18 ng/L    Comment: (NOTE) Elevated high sensitivity troponin I (hsTnI) values and significant  changes across serial measurements may suggest ACS but many other  chronic and acute conditions are known to elevate hsTnI results.  Refer to the "Links" section for chest pain algorithms and additional  guidance. Performed at [facility], [address] [place], [place] [address]   CBC with Differential     Status:  Abnormal   Collection Time: 08/24/21  1:55 AM  Result Value Ref Range   WBC 16.6 (H) 4.0 - 10.5 K/uL   RBC 4.42 4.22 - 5.81 MIL/uL   Hemoglobin 13.3 13.0 - 17.0 g/dL   HCT 41.8 39.0 - 52.0 %   MCV 94.6 80.0 - 100.0 fL   MCH 30.1 26.0 - 34.0 pg   MCHC 31.8 30.0 - 36.0 g/dL   RDW 15.6 (H) 11.5 - 15.5 %   Platelets 219 150 - 400 K/uL   nRBC 0.0 0.0 - 0.2 %   Neutrophils Relative % 66 %   Neutro Abs 11.1 (H) 1.7 - 7.7 K/uL   Lymphocytes Relative 26 %   Lymphs Abs 4.3 (H) 0.7 - 4.0 K/uL   Monocytes Relative 5 %   Monocytes Absolute 0.8 0.1 -  1.0 K/uL   Eosinophils Relative 1 %   Eosinophils Absolute 0.2 0.0 - 0.5 K/uL   Basophils Relative 1 %   Basophils Absolute 0.1 0.0 - 0.1 K/uL   Immature Granulocytes 1 %   Abs Immature Granulocytes 0.11 (H) 0.00 - 0.07 K/uL    Comment: Performed at [facility], [address] [place], [place] [address]  Lactic acid, plasma     Status: Abnormal   Collection Time: 08/24/21  1:55 AM  Result Value Ref Range   Lactic Acid, Venous 3.8 (HH) 0.5 - 1.9 mmol/L    Comment: CRITICAL RESULT CALLED TO, READ BACK BY AND VERIFIED WITH: PRUITT,G @ 0324 ON 08/24/21 BY JUW Performed at [facility], [address], [place], [place] [address]   Resp Panel by RT-PCR (Flu A&B, Covid) Nasopharyngeal Swab     Status: None   Collection Time: 08/24/21  2:15 AM   Specimen: Nasopharyngeal Swab; Nasopharyngeal(NP) swabs in vial transport medium  Result Value Ref Range   SARS Coronavirus 2 by RT PCR NEGATIVE NEGATIVE    Comment: (NOTE) SARS-CoV-2 target nucleic acids are NOT DETECTED.  The SARS-CoV-2 RNA is generally detectable in upper respiratory specimens during the acute phase of infection. The lowest concentration of SARS-CoV-2 viral copies this assay can detect is 138 copies/mL. A negative result does not preclude SARS-Cov-2 infection and should not be used as the sole basis for treatment or other patient management decisions. A negative result may occur with  improper specimen collection/handling, submission of specimen other than nasopharyngeal swab, presence of viral mutation(s) within the areas targeted by this assay, and inadequate number of viral copies(<138 copies/mL). A negative result must be combined with clinical observations, patient history, and epidemiological information. The expected result is Negative.  Fact Sheet for Patients:  EntrepreneurPulse.com.au  Fact Sheet for Healthcare Providers:  IncredibleEmployment.be  This test is no t yet  approved or cleared by the [place] FDA and  has been authorized for detection and/or diagnosis of SARS-CoV-2 by FDA under an Emergency Use Authorization (EUA). This EUA will remain  in effect (meaning this test can be used) for the duration of the COVID-19 declaration under Section 564(b)(1) of the Act, 21 U.S.C.section 360bbb-3(b)(1), unless the authorization is terminated  or revoked sooner.       Influenza A by PCR NEGATIVE NEGATIVE   Influenza B by PCR NEGATIVE NEGATIVE    Comment: (NOTE) The Xpert Xpress SARS-CoV-2/FLU/RSV plus assay is intended as an aid in the diagnosis of influenza from Nasopharyngeal swab specimens and should not be used as a sole basis for treatment. Nasal washings and aspirates are unacceptable for Xpert Xpress SARS-CoV-2/FLU/RSV testing.  Fact Sheet for Patients: EntrepreneurPulse.com.au  Fact Sheet for Healthcare Providers: IncredibleEmployment.be  This test is not yet approved or cleared by the [place] FDA and has been authorized for detection and/or diagnosis of SARS-CoV-2 by FDA under an Emergency Use Authorization (EUA). This EUA will remain in effect (meaning this test can be used) for the duration of the COVID-19 declaration under Section 564(b)(1) of the Act, 21 U.S.C. section 360bbb-3(b)(1), unless the authorization is terminated or revoked.  Performed at [facility], [address], [place], [place] [address]   Blood culture (routine x 2)     Status: None (Preliminary result)   Collection Time: 08/24/21  3:03 AM   Specimen: BLOOD LEFT HAND  Result Value Ref Range   Specimen Description BLOOD LEFT HAND    Special Requests      BOTTLES DRAWN AEROBIC AND ANAEROBIC Blood Culture adequate volume Performed at [facility], [address], [place], [place] [address]    Culture PENDING    Report Status PENDING   Blood culture (routine x 2)     Status: None (Preliminary result)   Collection  Time: 08/24/21  3:08 AM   Specimen: BLOOD LEFT FOREARM  Result Value Ref Range   Specimen Description BLOOD LEFT FOREARM    Special Requests      BOTTLES DRAWN AEROBIC AND ANAEROBIC Blood Culture results may not be optimal due to an excessive volume of blood received in culture bottles Performed at [facility], [address] [place], [place] [address]    Culture PENDING    Report Status PENDING   Blood gas, arterial     Status: Abnormal   Collection Time: 08/24/21  3:11 AM  Result Value Ref Range   FIO2 100.00    pH, Arterial 7.243 (L) 7.350 - 7.450   pCO2 arterial 57.7 (H) 32.0 - 48.0 mmHg   pO2, Arterial 108 83.0 - 108.0 mmHg   Bicarbonate 21.4 20.0 - 28.0 mmol/L   Acid-base deficit 2.5 (H) 0.0 - 2.0 mmol/L   O2 Saturation 95.7 %   Patient temperature 38.2    Allens test (pass/fail) PASS PASS    Comment: Performed at [facility], [address] [place], [place] [address]  Troponin I (High Sensitivity)     Status: Abnormal   Collection Time: 08/24/21  3:51 AM  Result Value Ref Range   Troponin I (High Sensitivity) 155 (HH) <18 ng/L    Comment: CRITICAL RESULT CALLED TO, READ BACK BY AND VERIFIED WITH: PRUITT,G @ 0436 ON 08/24/21 BY JUW (NOTE) Elevated high sensitivity troponin I (hsTnI) values and significant  changes across serial measurements may suggest ACS but many other  chronic and acute conditions are known to elevate hsTnI results.  Refer to the Links section for chest pain algorithms and additional  guidance. Performed at [facility], [address] [place], [place] [address]   Urinalysis, Routine w reflex microscopic Urine, Catheterized     Status: Abnormal   Collection Time: 08/24/21  4:10 AM  Result Value Ref Range   Color, Urine YELLOW YELLOW   APPearance HAZY (A) CLEAR   Specific Gravity, Urine 1.012 1.005 - 1.030   pH 5.0 5.0 - 8.0   Glucose, UA 50 (A) NEGATIVE mg/dL   Hgb urine dipstick SMALL (A) NEGATIVE   Bilirubin Urine NEGATIVE NEGATIVE    Ketones, ur NEGATIVE NEGATIVE mg/dL   Protein, ur 100 (A) NEGATIVE mg/dL   Nitrite NEGATIVE NEGATIVE   Leukocytes,Ua NEGATIVE NEGATIVE   RBC /  HPF 21-50 0 - 5 RBC/hpf   WBC, UA 21-50 0 - 5 WBC/hpf   Bacteria, UA RARE (A) NONE SEEN   Squamous Epithelial / LPF 0-5 0 - 5   Mucus PRESENT    Hyaline Casts, UA PRESENT     Comment: Performed at [facility], [address] [place], [place] [address]  Rapid urine drug screen (hospital performed)     Status: Abnormal   Collection Time: 08/24/21  4:10 AM  Result Value Ref Range   Opiates NONE DETECTED NONE DETECTED   Cocaine POSITIVE (A) NONE DETECTED   Benzodiazepines NONE DETECTED NONE DETECTED   Amphetamines NONE DETECTED NONE DETECTED   Tetrahydrocannabinol NONE DETECTED NONE DETECTED   Barbiturates NONE DETECTED NONE DETECTED    Comment: (NOTE) DRUG SCREEN FOR MEDICAL PURPOSES ONLY.  IF CONFIRMATION IS NEEDED FOR ANY PURPOSE, NOTIFY LAB WITHIN 5 DAYS.  LOWEST DETECTABLE LIMITS FOR URINE DRUG SCREEN Drug Class                     Cutoff (ng/mL) Amphetamine and metabolites    1000 Barbiturate and metabolites    200 Benzodiazepine                 536 Tricyclics and metabolites     300 Opiates and metabolites        300 Cocaine and metabolites        300 THC                            50 Performed at [facility], [address] [place], [place] [address]   CBC     Status: Abnormal   Collection Time: 08/24/21  4:58 AM  Result Value Ref Range   WBC 5.8 4.0 - 10.5 K/uL   RBC 3.64 (L) 4.22 - 5.81 MIL/uL   Hemoglobin 11.1 (L) 13.0 - 17.0 g/dL   HCT 33.7 (L) 39.0 - 52.0 %   MCV 92.6 80.0 - 100.0 fL   MCH 30.5 26.0 - 34.0 pg   MCHC 32.9 30.0 - 36.0 g/dL   RDW 15.5 11.5 - 15.5 %   Platelets 122 (L) 150 - 400 K/uL   nRBC 0.0 0.0 - 0.2 %    Comment: Performed at [facility], [address], [place], [place] [address]  Blood gas, arterial     Status: Abnormal   Collection Time: 08/24/21  5:00 AM  Result Value Ref Range   FIO2  70.00    pH, Arterial 7.344 (L) 7.350 - 7.450   pCO2 arterial 44.0 32.0 - 48.0 mmHg   pO2, Arterial 96.4 83.0 - 108.0 mmHg   Bicarbonate 22.8 20.0 - 28.0 mmol/L   Acid-base deficit 1.6 0.0 - 2.0 mmol/L   O2 Saturation 95.9 %   Patient temperature 37.6    Allens test (pass/fail) LEFT BRACHIAL (A) PASS    Comment: Performed at [facility], [address], [place],  [address]    Chemistries  Recent Labs  Lab 08/24/21 0155  NA 136  K 3.4*  CL 103  CO2 24  GLUCOSE 249*  BUN 19  CREATININE 1.32*  CALCIUM 8.4*  AST 34  ALT 22  ALKPHOS 96  BILITOT 0.7   ------------------------------------------------------------------------------------------------------------------  ------------------------------------------------------------------------------------------------------------------ GFR: Estimated Creatinine Clearance: 81.6 mL/min (A) (by C-G formula based on SCr of 1.32 mg/dL (H)). Liver Function Tests: Recent Labs  Lab 08/24/21 0155  AST 34  ALT 22  ALKPHOS 96  BILITOT 0.7  PROT 7.6  ALBUMIN 3.9   No results for input(s): LIPASE, AMYLASE in the last 168 hours. No results for input(s): AMMONIA in the last 168 hours. Coagulation Profile: No results for input(s): INR, PROTIME in the last 168 hours. Cardiac Enzymes: No results for input(s): CKTOTAL, CKMB, CKMBINDEX, TROPONINI in the last 168 hours. BNP (last 3 results) No results for input(s): PROBNP in the last 8760 hours. HbA1C: No results for input(s): HGBA1C in the last 72 hours. CBG: Recent Labs  Lab 08/24/21 0150  GLUCAP 240*   Lipid Profile: No results for input(s): CHOL, HDL, LDLCALC, TRIG, CHOLHDL, LDLDIRECT in the last 72 hours. Thyroid Function Tests: No results for input(s): TSH, T4TOTAL, FREET4, T3FREE, THYROIDAB in the last 72 hours. Anemia Panel: No results for input(s): VITAMINB12, FOLATE, FERRITIN, TIBC, IRON, RETICCTPCT in the last 72  hours.  --------------------------------------------------------------------------------------------------------------- Urine analysis:    Component Value Date/Time   COLORURINE YELLOW 08/24/2021 0410   APPEARANCEUR HAZY (A) 08/24/2021 0410   LABSPEC 1.012 08/24/2021 0410   PHURINE 5.0 08/24/2021 0410   GLUCOSEU 50 (A) 08/24/2021 0410   HGBUR SMALL (A) 08/24/2021 0410   BILIRUBINUR NEGATIVE 08/24/2021 0410   KETONESUR NEGATIVE 08/24/2021 0410   PROTEINUR 100 (A) 08/24/2021 0410   NITRITE NEGATIVE 08/24/2021 0410   LEUKOCYTESUR NEGATIVE 08/24/2021 0410      Imaging Results:    DG Abd 1 View  Result Date: 08/24/2021 CLINICAL DATA:  OG tube placement EXAM: ABDOMEN - 1 VIEW COMPARISON:  None. FINDINGS: OG tube appears adequately positioned in the stomach. Paucity of small and large bowel gas within the visualized portion of the abdomen. No evidence of free intraperitoneal air within the visualized portion of the abdomen. IMPRESSION: OG tube adequately positioned in the stomach. Electronically Signed   By: Franki Cabot M.D.   On: 08/24/2021 05:08   DG Chest Port 1 View  Result Date: 08/24/2021 CLINICAL DATA:  Shortness of breath EXAM: PORTABLE CHEST 1 VIEW COMPARISON:  07/24/2020 FINDINGS: Cardiac shadow is enlarged but stable. New right chest wall port is noted in satisfactory position. Lungs are well aerated bilaterally. Diffuse airspace opacity is noted bilaterally likely related to edema. These changes are new from recent PET-CT from 06/26/2021. No bony abnormality is noted. IMPRESSION: Increase airspace opacity bilaterally likely related to edema. Possibility of lymphomatous involvement deserves consideration as well. These changes are new from prior PET-CT from 06/26/2021. Electronically Signed   By: Inez Catalina M.D.   On: 08/24/2021 02:04   DG Chest Port 1V same Day  Result Date: 08/24/2021 CLINICAL DATA:  Orogastric tube placement EXAM: PORTABLE CHEST 1 VIEW COMPARISON:   08/24/2021 FINDINGS: Lower right lung opacities are unchanged. Unchanged position of right chest wall Port-A-Cath. Orogastric tube tip and side port are below the field of view. No pleural effusion or pneumothorax. IMPRESSION: Orogastric tube tip and side port below the field of view. Electronically Signed   By: Ulyses Jarred M.D.   On: 08/24/2021 03:06    My personal review of EKG: Rhythm sinus tachycardia, Rate 148 /min, QTc 473 ,no Acute ST changes   Assessment & Plan:    Active Problems:   CHF exacerbation (HCC)   HTN (hypertension)   Substance abuse ([place])   AIDS (acquired immune deficiency syndrome) ([place])   Acute respiratory failure with hypoxia and hypercapnia (HCC)   Sepsis (HCC)   Elevated troponin   Acute lower UTI   Hypokalemia   Acute respiratory failure with  hypoxia and hypercapnia Oxygen sats in the 60s upon EMS arrival PCO2 57 with a pH of 7.243 Secondary to cocaine induced hypertensive emergency resulting in pulmonary edema Ultimately heart failure Patient currently on mechanical ventilation Check ABG with morning labs Breathing treatments for wheezing Negative respiratory panel BNP 1714 Troponins increasing from 96-155 in the setting of hypoxia and likely demand ischemia Chest x-ray showed pulmonary edema continue nitro drip, holding Lasix in the setting of likely sepsis, possibly volume down as there is been very little urine output with 80 the setting of milligrams of IV Lasix Sepsis Sepsis secondary to UTI SIRS criteria tachycardic, febrile, leukocytosis Most likely source of infection is UTI without previous urine culture, most likely gram-negative rod Life threatening organ dysfunction 2/2 infection is evidenced by: Lactic acidosis 3.8, respiratory failure requiring mechanical ventilation Antibiotics started vancomycin and Zosyn -continue Patient is at risk for atypical infections given history of AIDS Fluids given prior to admission 500 mL  bolus Hypertensive initially with propofol, propofol discontinued continue to monitor blood pressure Sepsis order set utilized Monitor this patient in ICU Hypokalemia Replace and recheck Cocaine induced hypertensive emergency/pulmonary edema/CHF With pulmonary edema, respiratory failure Continue nitro drip Holding Lasix at this time given sepsis above Last echo was in January showing ejection fraction of 55 to 60% with indeterminate diastolic parameters, update echo Daily weights, monitor intake and output ARB, beta-blocker ordered Continue to monitor Substance abuse UDS showing cocaine When patient comes up then he will need cessation counseling and possible TOC consult HIV/AIDS Check CD4 count to assess the need to cover opportunistic infections   DVT Prophylaxis-   Heparin- SCDs   AM Labs Ordered, also please review Full Orders  Family Communication: No family at bedside  Code Status: Full  Admission status: Inpatient :The appropriate admission status for this patient is INPATIENT. Inpatient status is judged to be reasonable and necessary in order to provide the required intensity of service to ensure the patient's safety. The patient's presenting symptoms, physical exam findings, and initial radiographic and laboratory data in the context of their chronic comorbidities is felt to place them at high risk for further clinical deterioration. Furthermore, it is not anticipated that the patient will be medically stable for discharge from the hospital within 2 midnights of admission. The following factors support the admission status of inpatient.     The patient's presenting symptoms include dyspnea. The worrisome physical exam findings include hypoxia, SIRS criteria, respiratory distress. The initial radiographic and laboratory data are worrisome because of pulmonary edema, lactic acidosis, leukocytosis. The chronic co-morbidities include HIV/AIDS, hypertension, CHF, substance  abuse       * I certify that at the point of admission it is my clinical judgment that the patient will require inpatient hospital care spanning beyond 2 midnights from the point of admission due to high intensity of service, high risk for further deterioration and high frequency of surveillance required.*  Time spent in minutes : Wiley Ford

## 2021-08-24 NOTE — Progress Notes (Signed)
Transfer order in place for [facility] ICU.  Patient accepted by PCCM Dr. Doyle Askew.

## 2021-08-24 NOTE — Progress Notes (Signed)
Called the patient's mother Donyel Nester at [phone] x [phone] to give updates on patient's condition.  No answer x3.  Left a voicemail message.

## 2021-08-24 NOTE — Progress Notes (Addendum)
PROGRESS NOTE  Richard Cox BHA:[id number] DOB: 1981/07/07 DOA: 08/24/2021 PCP: Lindell Spar, MD  HPI/Recap of past 24 hours: Richard Cox  is a 40 y.o. male, with history of CHF, hypertension, HIV infection, high-grade large B-cell lymphoma, cocaine abuse, medical noncompliance, and cardiomyopathy, presents to [facility] ED from home via EMS with chief complaint of dyspnea.  Became acutely dyspneic last night.  EMS was activated.  Was found to have O2 saturation in the 60s upon EMS arrival.  Upon arrival to the ED he was in severe respiratory distress, with altered mental status.  He was initially placed on nonrebreather.  Arterial blood gas showed hypoxemia and hypercarbia with arterial pH of 7.2.  Due to extreme anxiety, he received 2 mg of IV Ativan but with little improvement.  His initial blood pressures were markedly elevated 247/148, BNP greater than 1700, chest x-ray consistent with pulmonary edema.  He was started on nitro drip and given 1 dose of IV Lasix 40 mg x1 with minimal diuresis.  His BP improved on nitro drip.  However he remained in respiratory distress despite BiPAP and required intubation in the ED by EDP, Dr. Stark Jock.    08/24/2021: Patient was seen and examined at his bedside.  He is intubated and sedated.  Assessment/Plan: Active Problems:   CHF exacerbation (HCC)   HTN (hypertension)   Substance abuse ([place])   AIDS (acquired immune deficiency syndrome) (HCC)   Acute respiratory failure with hypoxia and hypercapnia (HCC)   Sepsis (HCC)   Elevated troponin   Acute lower UTI   Hypokalemia  Severe acute hypoxic hypercarbic respiratory failure requiring invasive mechanical ventilation. Presented via EMS due to severe respiratory distress.  On EMS arrival O2 saturation in the 60s on room air.  Not on oxygen supplementation at baseline.   Initially placed on BiPAP in the ED with no improvement, required intubation. Currently intubated and sedated. Continue fentanyl drip ABG this  morning on FiO2 70%, pH 7.3 4/44/90 6.4/22.8. Repeat ABG in the morning. Personally reviewed chest x-ray done on 08/24/2021 showing bilateral lung opacities and increasing pulmonary vascularity suggestive of pulmonary edema, ET tube about 4 cm above the carina.  OG tube below the diaphragm and into the stomach. Repeat chest x-ray in the morning. Dietitian consulted for feeding tube initiation. Maintain head of bed elevated greater than 30 degrees during tube feeding.  Acute on chronic diastolic CHF On presentation BNP greater than 1700, pulmonary edema on chest x-ray. Received IV Lasix 40 mg x 1 in the ED. Last 2D echo done on 11/18/2020 showed LVEF 55 to 60%, severe asymmetric septal hypertrophy without obstructive gradient.  The left ventricle had no regional wall motion abnormalities.  Left atrial size was severely dilated. 2D echo obtained on 08/24/2021, follow results. Add strict I's and O's and daily weight. Consult cardiology once patient arrives at [facility].  Elevated troponin, suspect demand ischemia in the setting of severe hypoxemia, need to rule out ACS. Troponin uptrending, 96, 155, 177. T inversion in V5 and V6 Repeat stat twelve-lead EKG 1 dose of aspirin 325 mg GI via tube ordered. Start heparin drip for now until seen by cardiology.  Hypertensive emergency History of hypertension on prior to admission Coreg 6.25 mg twice daily, p.o. hydralazine 50 mg 3 times daily, Imdur 30 mg daily, and losartan 50 mg daily. Presented with markedly elevated BP 247/148 and flash pulmonary edema on chest x-ray, elevated troponin BP improved on nitro drip, continue drip.  Sepsis secondary to  presumptive UTI in the setting of immunosuppression, HIV. UA positive for pyuria Fever with T-max of 100.8 on presentation, lactic acid 3.8, tachycardia, tachypnea Blood cultures and urine culture in process. Obtain MRSA screen, procalcitonin.   Start cefepime and linezolid  empirically. Judicious IV fluid hydration LR 30 cc/h x 1 day until seen by cardiology. Closely monitor volume status while on IV fluid.  Lactic acidosis in the setting of severe hypoxemia and hypercarbia. Presented with lactic acid 3.8, now has normalized post intubation.  AKI on CKD 2 Baseline creatinine appears to be 1.2 with GFR greater than 60 Creatinine uptrending 1.5 Closely monitor urine output with strict I's and O's. Avoid nephrotoxic agents and hypotension. Closely monitor renal function and electrolytes daily.  Hypocalcemia Calcium 8.0, albumin 3.4 Corrected calcium for albumin 8.5 1 dose of 1 g IV calcium gluconate ordered  Acute metabolic encephalopathy in the setting of severe hypoxemia and hypercarbia, sepsis, now intubated Obtain a stat CT head without contrast to rule out any acute intracranial findings.  Sinus tachycardia suspect secondary to lung physiology and likely contributed by fever Heart rate peaked at 143, now improved Add on TSH Continue to closely monitor.  HIV with history of noncompliance Last CD4 count 26 on 06/19/2021 Resume home HAART medication via OG tube Please consult infectious disease once the patient arrives at [facility].  High-grade large B-cell lymphoma Unclear if patient was being treated for this Obtain peripheral smear Please consult medical oncology once the patient arrives at [facility].  Polysubstance abuse, including cocaine. UDS 08/24/2021 positive for cocaine. Previous UDS on 08/26/2020 positive for opiates, cocaine and THC. Will need polysubstance cessation counseling after extubation.    Critical care time: 75 minutes.    Code Status: Full code  Family Communication: None at bedside  Disposition Plan: Transfer to [facility] ICU.   Consultants: Please consult cardiology, infectious disease, medical oncology once the patient arrives at [facility].  Procedures: Intubation on 08/24/2021 by EDP Dr. Stark Jock.  Antimicrobials: Cefepime started on 08/24/2021 Linezolid started on 08/24/2021   DVT prophylaxis: Heparin drip  Status is: Inpatient        Objective: Vitals:   08/24/21 0700 08/24/21 0730 08/24/21 0800 08/24/21 0815  BP: 115/76 123/79 119/80 124/77  Pulse: 81 84 86 85  Resp: (!) 28 (!) 28 (!) 28 (!) 28  Temp: 99.5 F (37.5 C) 99.7 F (37.6 C) 99.9 F (37.7 C) 100 F (37.8 C)  SpO2: 100% 100% 99% 100%  Weight:      Height:        Intake/Output Summary (Last 24 hours) at 08/24/2021 1007 Last data filed at 08/24/2021 1001 Gross per 24 hour  Intake 1097.03 ml  Output --  Net 1097.03 ml   Filed Weights   08/24/21 0151  Weight: 76 kg    Exam:  General: 40 y.o. year-old male well developed well nourished in no acute distress.  Intubated and sedated. Cardiovascular: Tachycardic with no rubs or gallops.  No thyromegaly or JVD noted.   Respiratory: Mild diffuse rales bilaterally with no wheezing noted.  Intubated, on invasive mechanical ventilation. Abdomen: Soft nontender nondistended with normal bowel sounds x4 quadrants. Musculoskeletal: No lower extremity edema. 2/4 pulses in all 4 extremities. Neuro: Sedated. Skin: No ulcerative lesions noted or rashes. Psychiatry: Unable to assess mood due to sensation.   Data Reviewed: CBC: Recent Labs  Lab 08/24/21 0155 08/24/21 0458  WBC 16.6* 5.8  NEUTROABS 11.1*  --  HGB 13.3 11.1*  HCT 41.8 33.7*  MCV 94.6 92.6  PLT 219 737*   Basic Metabolic Panel: Recent Labs  Lab 08/24/21 0155 08/24/21 0458  NA 136 138  K 3.4* 4.2  CL 103 107  CO2 24 25  GLUCOSE 249* 83  BUN 19 22*  CREATININE 1.32* 1.50*  CALCIUM 8.4* 8.0*  MG  --  1.9   GFR: Estimated Creatinine Clearance: 71.8 mL/min (A) (by C-G formula based on SCr of 1.5 mg/dL (H)). Liver Function Tests: Recent Labs  Lab 08/24/21 0155 08/24/21 0458  AST 34 30  ALT 22 19   ALKPHOS 96 77  BILITOT 0.7 0.6  PROT 7.6 6.3*  ALBUMIN 3.9 3.4*   No results for input(s): LIPASE, AMYLASE in the last 168 hours. No results for input(s): AMMONIA in the last 168 hours. Coagulation Profile: No results for input(s): INR, PROTIME in the last 168 hours. Cardiac Enzymes: No results for input(s): CKTOTAL, CKMB, CKMBINDEX, TROPONINI in the last 168 hours. BNP (last 3 results) No results for input(s): PROBNP in the last 8760 hours. HbA1C: No results for input(s): HGBA1C in the last 72 hours. CBG: Recent Labs  Lab 08/24/21 0150  GLUCAP 240*   Lipid Profile: No results for input(s): CHOL, HDL, LDLCALC, TRIG, CHOLHDL, LDLDIRECT in the last 72 hours. Thyroid Function Tests: No results for input(s): TSH, T4TOTAL, FREET4, T3FREE, THYROIDAB in the last 72 hours. Anemia Panel: No results for input(s): VITAMINB12, FOLATE, FERRITIN, TIBC, IRON, RETICCTPCT in the last 72 hours. Urine analysis:    Component Value Date/Time   COLORURINE YELLOW 08/24/2021 0410   APPEARANCEUR HAZY (A) 08/24/2021 0410   LABSPEC 1.012 08/24/2021 0410   PHURINE 5.0 08/24/2021 0410   GLUCOSEU 50 (A) 08/24/2021 0410   HGBUR SMALL (A) 08/24/2021 0410   BILIRUBINUR NEGATIVE 08/24/2021 0410   KETONESUR NEGATIVE 08/24/2021 0410   PROTEINUR 100 (A) 08/24/2021 0410   NITRITE NEGATIVE 08/24/2021 0410   LEUKOCYTESUR NEGATIVE 08/24/2021 0410   Sepsis Labs: @LABRCNTIP (procalcitonin:4,lacticidven:4)  ) Recent Results (from the past 240 hour(s))  Resp Panel by RT-PCR (Flu A&B, Covid) Nasopharyngeal Swab     Status: None   Collection Time: 08/24/21  2:15 AM   Specimen: Nasopharyngeal Swab; Nasopharyngeal(NP) swabs in vial transport medium  Result Value Ref Range Status   SARS Coronavirus 2 by RT PCR NEGATIVE NEGATIVE Final    Comment: (NOTE) SARS-CoV-2 target nucleic acids are NOT DETECTED.  The SARS-CoV-2 RNA is generally detectable in upper respiratory specimens during the acute phase of  infection. The lowest concentration of SARS-CoV-2 viral copies this assay can detect is 138 copies/mL. A negative result does not preclude SARS-Cov-2 infection and should not be used as the sole basis for treatment or other patient management decisions. A negative result may occur with  improper specimen collection/handling, submission of specimen other than nasopharyngeal swab, presence of viral mutation(s) within the areas targeted by this assay, and inadequate number of viral copies(<138 copies/mL). A negative result must be combined with clinical observations, patient history, and epidemiological information. The expected result is Negative.  Fact Sheet for Patients:  EntrepreneurPulse.com.au  Fact Sheet for Healthcare Providers:  IncredibleEmployment.be  This test is no t yet approved or cleared by the [place] FDA and  has been authorized for detection and/or diagnosis of SARS-CoV-2 by FDA under an Emergency Use Authorization (EUA). This EUA will remain  in effect (meaning this test can be used) for the duration of the COVID-19 declaration under Section 564(b)(1) of the  Act, 21 U.S.C.section 360bbb-3(b)(1), unless the authorization is terminated  or revoked sooner.       Influenza A by PCR NEGATIVE NEGATIVE Final   Influenza B by PCR NEGATIVE NEGATIVE Final    Comment: (NOTE) The Xpert Xpress SARS-CoV-2/FLU/RSV plus assay is intended as an aid in the diagnosis of influenza from Nasopharyngeal swab specimens and should not be used as a sole basis for treatment. Nasal washings and aspirates are unacceptable for Xpert Xpress SARS-CoV-2/FLU/RSV testing.  Fact Sheet for Patients: EntrepreneurPulse.com.au  Fact Sheet for Healthcare Providers: IncredibleEmployment.be  This test is not yet approved or cleared by the [place] FDA and has been authorized for detection and/or diagnosis of SARS-CoV-2  by FDA under an Emergency Use Authorization (EUA). This EUA will remain in effect (meaning this test can be used) for the duration of the COVID-19 declaration under Section 564(b)(1) of the Act, 21 U.S.C. section 360bbb-3(b)(1), unless the authorization is terminated or revoked.  Performed at [facility], [address] [place], [place] [address]   Blood culture (routine x 2)     Status: None (Preliminary result)   Collection Time: 08/24/21  3:03 AM   Specimen: BLOOD LEFT HAND  Result Value Ref Range Status   Specimen Description BLOOD LEFT HAND  Final   Special Requests   Final    BOTTLES DRAWN AEROBIC AND ANAEROBIC Blood Culture adequate volume   Culture   Final    NO GROWTH < 12 HOURS Performed at [facility], [address], [place], [place] [address]    Report Status PENDING  Incomplete  Blood culture (routine x 2)     Status: None (Preliminary result)   Collection Time: 08/24/21  3:08 AM   Specimen: BLOOD LEFT FOREARM  Result Value Ref Range Status   Specimen Description BLOOD LEFT FOREARM  Final   Special Requests   Final    BOTTLES DRAWN AEROBIC AND ANAEROBIC Blood Culture results may not be optimal due to an excessive volume of blood received in culture bottles   Culture   Final    NO GROWTH < 12 HOURS Performed at [facility], [address] [place], [place] [address]    Report Status PENDING  Incomplete      Studies: DG Abd 1 View  Result Date: 08/24/2021 CLINICAL DATA:  OG tube placement EXAM: ABDOMEN - 1 VIEW COMPARISON:  None. FINDINGS: OG tube appears adequately positioned in the stomach. Paucity of small and large bowel gas within the visualized portion of the abdomen. No evidence of free intraperitoneal air within the visualized portion of the abdomen. IMPRESSION: OG tube adequately positioned in the stomach. Electronically Signed   By: Franki Cabot M.D.   On: 08/24/2021 05:08   DG Chest Port 1 View  Result Date: 08/24/2021 CLINICAL DATA:  Shortness of  breath EXAM: PORTABLE CHEST 1 VIEW COMPARISON:  07/24/2020 FINDINGS: Cardiac shadow is enlarged but stable. New right chest wall port is noted in satisfactory position. Lungs are well aerated bilaterally. Diffuse airspace opacity is noted bilaterally likely related to edema. These changes are new from recent PET-CT from 06/26/2021. No bony abnormality is noted. IMPRESSION: Increase airspace opacity bilaterally likely related to edema. Possibility of lymphomatous involvement deserves consideration as well. These changes are new from prior PET-CT from 06/26/2021. Electronically Signed   By: Inez Catalina M.D.   On: 08/24/2021 02:04   DG Chest Port 1V same Day  Result Date: 08/24/2021 CLINICAL DATA:  Orogastric tube placement EXAM: PORTABLE CHEST  1 VIEW COMPARISON:  08/24/2021 FINDINGS: Lower right lung opacities are unchanged. Unchanged position of right chest wall Port-A-Cath. Orogastric tube tip and side port are below the field of view. No pleural effusion or pneumothorax. IMPRESSION: Orogastric tube tip and side port below the field of view. Electronically Signed   By: Ulyses Jarred M.D.   On: 08/24/2021 03:06    Scheduled Meds:  carvedilol  6.25 mg Oral BID WC   heparin  5,000 Units Subcutaneous Q8H   hydrALAZINE  50 mg Oral TID   losartan  50 mg Oral Daily    Continuous Infusions:  fentaNYL infusion INTRAVENOUS 225 mcg/hr (08/24/21 1001)   nitroGLYCERIN 5 mcg/min (08/24/21 0959)     LOS: 0 days     Kayleen Memos, MD [facility] Pager [phone]  If 7PM-7AM, please contact night-coverage [email or web address] Password [email or web address] 08/24/2021, 10:07 AM

## 2021-08-24 NOTE — Progress Notes (Signed)
Pt arrived from [facility] via [facility]. Pt with 7.5 ETT 25cm at the lip. Pt placed on previous vent settings and fio2 weaned to 40%. RT will continue to monitor and be available as needed.

## 2021-08-24 NOTE — Consult Note (Addendum)
Cardiology Consultation:   Patient ID: Richard Cox MRN: [id number]; DOB: 1981/07/21  Admit date: 08/24/2021 Date of Consult: 08/24/2021  PCP:  Lindell Spar, MD   [facility] Providers Cardiologist:  Rozann Lesches, MD        Patient Profile:   Richard Cox is a 40 y.o. 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 Delton Coombes), HIV and substance abuse (alcohol and cocaine) who is being seen 08/24/2021 for the evaluation of CHF and abnormal EKG at the request of Dr. [place] Crane.  History of Present Illness:   Richard Cox was last examined by Katina Dung, NP in 10/2019 following a recent hospitalization for hypertensive urgency as he had been without his medications for over 2 months prior to admission. He reported overall doing well at that time and was continued on Coreg, Hydralazine, Lasix and Imdur with plans to follow-up in 2 months but has not been evaluated by Cardiology since.  He presented to [facility] ED during the early morning hours of 08/24/2021 for worsening dyspnea and saturations were in the 60's upon arrival. He did have altered mental status and BiPAP was initially placed but he required intubation. BP was significantly elevated at 247/148 upon arrival and HR in the 140's.   Initial labs showed WBC 16.6, Hgb 13.3, platelets 219, Na+ 136, K+ 3.4 and creatinine 1.32. BNP 1714. Initial Hs Troponin 96 with repeat values of 155, 177, 169 and 177. TSH 0.786. COVID negative. Initial Lactic Acid 3.8 but improved to 0.8 on most recent check. ABG showing pH 7.243 with CO2 57.7. UDS positive for cocaine. UA concerning for UTI and culture pending. CXR showing bilateral airspace opacity to be consistent with edema. Initial EKG showed a narrow-complex  tachycardia, HR 148 with TWI along the lateral leads and noted to have significant deep TWI on prior tracings. Repeat around 1207 showed ST elevation along V3-V4 with ST depression along the inferior and lateral leads.   Received IV Lasix 55m x3 while in the ED. Was also on IV NTG but now discontinued. Remains on Heparin along with Cefepime and Linezolid. [facility]  At this time, he is currently intubated and sedated.  He will open his eyes to verbal stimuli.   Past Medical History:  Diagnosis Date   CHF (congestive heart failure) (HCC)    Essential hypertension    [place] infection [place]    Noncompliance with medication regimen    [place]     Past Surgical History:  Procedure Laterality Date   IR IMAGING GUIDED PORT INSERTION  08/23/2020   Right   NO PAST SURGERIES       Home Medications:  Prior to Admission medications   Medication Sig Start Date End Date Taking? Authorizing Provider  acetaminophen (TYLENOL) 500 MG tablet Take 1,000 mg by mouth every 6 (six) hours as needed for moderate pain or headache.    [provider]  allopurinol (ZYLOPRIM) 300 MG tablet Take 1 tablet (300 mg total) by mouth daily. 09/30/20   KDerek Jack MD  bictegravir-emtricitabine-tenofovir AF (BIKTARVY) 50-200-25 MG TABS tablet TAKE 1 TABLET BY MOUTH 1 TIME A DAY. 06/19/21   CGolden Circle FNP  carvedilol (COREG) 6.25 MG tablet Take 1 tablet (  6.25 mg total) by mouth 2 (two) times daily with a meal. 02/25/21   Derek Jack, MD  dicyclomine (BENTYL) 10 MG capsule  07/30/20   [provider]  docusate sodium (COLACE) 100 MG capsule Take 100 mg by mouth daily as needed for mild constipation.    [provider]  furosemide (LASIX) 40 MG tablet Take 1 tablet (40 mg total) by mouth daily. 07/30/20 11/27/20  Lindell Spar, MD  hydrALAZINE (APRESOLINE) 50 MG tablet Take 1 tablet (50 mg  total) by mouth 3 (three) times daily. 02/25/21   Derek Jack, MD  isosorbide mononitrate (IMDUR) 30 MG 24 hr tablet Take 30 mg by mouth daily. 04/30/20   [provider]  lidocaine-prilocaine (EMLA) cream Apply 1 application topically as needed. Patient not taking: Reported on 07/01/2021 08/28/20   Derek Jack, MD  losartan (COZAAR) 50 MG tablet Take 1 tablet (50 mg total) by mouth daily. 02/25/21   Derek Jack, MD  ondansetron (ZOFRAN-ODT) 4 MG disintegrating tablet  07/24/20   [provider]  oxyCODONE (OXY IR/ROXICODONE) 5 MG immediate release tablet Take 1 tablet (5 mg total) by mouth every 12 (twelve) hours as needed for severe pain. 05/22/21   Derek Jack, MD  potassium chloride (KLOR-CON) 20 MEQ tablet Take 0.5 tablets (10 mEq total) by mouth daily. 09/23/20   Derek Jack, MD  prochlorperazine (COMPAZINE) 10 MG tablet Take 1 tablet (10 mg total) by mouth every 6 (six) hours as needed for nausea or vomiting. Patient not taking: Reported on 07/01/2021 12/02/20   Derek Jack, MD    Inpatient Medications: Scheduled Meds:  [START ON 08/25/2021] aspirin EC  81 mg Oral Daily   bictegravir-emtricitabine-tenofovir AF  1 tablet Oral Daily   docusate  100 mg Per Tube BID   feeding supplement (PROSource TF)  45 mL Per Tube BID   feeding supplement (VITAL HIGH PROTEIN)  1,000 mL Per Tube Q24H   fentaNYL (SUBLIMAZE) injection  50 mcg Intravenous Once   polyethylene glycol  17 g Per Tube Daily   Continuous Infusions:  calcium gluconate     ceFEPime (MAXIPIME) IV 2 g (08/24/21 1421)   fentaNYL infusion INTRAVENOUS     heparin 950 Units/hr (08/24/21 1155)   linezolid (ZYVOX) IV     propofol (DIPRIVAN) infusion 20 mcg/kg/min (08/24/21 1418)   PRN Meds: acetaminophen **OR** acetaminophen, albuterol, fentaNYL, fentaNYL (SUBLIMAZE) injection, fentaNYL (SUBLIMAZE) injection, ondansetron **OR** ondansetron (ZOFRAN) IV  Allergies:     Allergies  Allergen Reactions   Neosporin [Neomycin-Bacitracin Zn-Polymyx] Rash    Social History:   Social History   Socioeconomic History   Marital status: Single    Spouse name: Not on file   Number of children: 0   Years of education: 13   Highest education level: Not on file  Occupational History   Not on file  Tobacco Use   Smoking status: Light Smoker    Packs/day: 0.25    Types: Cigarettes   Smokeless tobacco: Never  Vaping Use   Vaping Use: Never used  Substance and Sexual Activity   Alcohol use: Not Currently   Drug use: Not Currently    Types: Marijuana    Comment: x 1 week   Sexual activity: Not Currently    Comment: pt given condoms  Other Topics Concern   Not on file  Social History Narrative   Not on file   Social Determinants of Health   Financial Resource Strain: Low Risk    Difficulty of  Paying Living Expenses: Not very hard  Food Insecurity: No Food Insecurity   Worried About [place] in the Last Year: Never true   Ran Out of Food in the Last Year: Never true  Transportation Needs: No Transportation Needs   Lack of Transportation (Medical): No   Lack of Transportation (Non-Medical): No  Physical Activity: Insufficiently Active   Days of Exercise per Week: 5 days   Minutes of Exercise per Session: 20 min  Stress: No Stress Concern Present   Feeling of Stress : Only a little  Social Connections: Socially Isolated   Frequency of Communication with Friends and Family: More than three times a week   Frequency of Social Gatherings with Friends and Family: Never   Attends Religious Services: Never   Printmaker: No   Attends Music therapist: Not on file   Marital Status: Never married  Human resources officer Violence: Not At Risk   Fear of Current or Ex-Partner: No   Emotionally Abused: No   Physically Abused: No   Sexually Abused: No    Family History:    Family History  Problem Relation  Age of Onset   Diabetes Mellitus II Mother    Cancer Mother        multiple myeloma   Cancer Father        multiple myeloma   Congestive Heart Failure Brother    Heart Problems Brother    Lupus Brother      ROS:   Unable to be obtained.  Currently intubated and sedated.  Physical Exam/Data:   Vitals:   08/24/21 1009 08/24/21 1030 08/24/21 1115 08/24/21 1310  BP: 115/67 114/70 113/79 (!) 123/93  Pulse: 86 83 82 87  Resp: (!) 25 (!) 28 (!) 28 (!) 28  Temp: 100.2 F (37.9 C) 100 F (37.8 C) 99.9 F (37.7 C)   SpO2: 99% 98% 99% 100%  Weight:      Height:        Intake/Output Summary (Last 24 hours) at 08/24/2021 1602 Last data filed at 08/24/2021 1216 Gross per 24 hour  Intake 1187.72 ml  Output 1500 ml  Net -312.28 ml   Last 3 Weights 08/24/2021 07/01/2021 06/19/2021  Weight (lbs) 167 lb 8.8 oz 166 lb 12.8 oz 172 lb  Weight (kg) 76 kg 75.66 kg 78.019 kg     Body mass index is 22.11 kg/m.  General:  Well-nourished male appearing in no acute distress. Currently intubated and sedated.  HEENT: normal Neck: JVD at 10 cm Vascular: No carotid bruits; Distal pulses 2+ bilaterally Cardiac:  normal S1, S2; RRR; 2/6 systolic murmur along LLSB Lungs:  decreased breath sounds along bases bilaterally Abd: soft, nontender, no hepatomegaly  Ext: no pitting edema Musculoskeletal:  No deformities, BUE and BLE strength normal and equal Skin: warm and dry  Neuro: no focal abnormalities noted Psych: Unable to assess.  Intubated and sedated.  EKG:  The EKG was personally reviewed and demonstrates: Initial EKG showed a narrow-complex tachycardia, HR 148 with TWI along the lateral leads and noted to have significant deep TWI on prior tracings. Repeat around 1207 showed ST elevation along V3-V4 with ST depression along the inferior and lateral leads.   Telemetry:  Telemetry was personally reviewed and demonstrates:  NSR, HR in 70's to 80's.   Relevant CV Studies:  Echocardiogram:  09/18/2019 IMPRESSIONS     1. Left ventricular ejection fraction, by visual estimation, is 40 to  45%.  The left ventricle has mildly decreased function. There is severely  increased left ventricular hypertrophy.   2. Left ventricular diastolic parameters are indeterminate.   3. Global right ventricle has normal systolic function.The right  ventricular size is normal. Moderately increased right ventricular wall  thickness.   4. Left atrial size was moderately dilated.   5. Right atrial size was mildly dilated.   6. Moderate pleural effusion in the left lateral region.   7. Small pericardial effusion.   8. Small to moderate circumferential pericardial effusion. No evidence of  tampoande physiology.   9. The mitral valve is normal in structure. No evidence of mitral valve  regurgitation. No evidence of mitral stenosis.  10. The tricuspid valve is normal in structure. Tricuspid valve  regurgitation is trivial.  11. The aortic valve is tricuspid. Aortic valve regurgitation is not  visualized. No evidence of aortic valve sclerosis or stenosis.  12. The pulmonic valve was not well visualized. Pulmonic valve  regurgitation is not visualized.  13. Normal pulmonary artery systolic pressure.  14. The inferior vena cava is normal in size with greater than 50%  respiratory variability, suggesting right atrial pressure of 3 mmHg.  15. As with prior studies, the degree of LVH and myocardial appearance  could suggest potential infiltrative cardiomyopathy such as amyloidosis.  Consider cardiac MRI.   Echocardiogram: 11/18/2020 IMPRESSIONS     1. Severe asymmetric septal hypertrophy without obstructive gradient. .  Left ventricular ejection fraction, by estimation, is 55 to 60%. The left  ventricle has normal function. The left ventricle has no regional wall  motion abnormalities. There is  severe asymmetric left ventricular hypertrophy of the septal segment. Left  ventricular diastolic  parameters are indeterminate.   2. Right ventricular systolic function is normal. The right ventricular  size is normal. There is normal pulmonary artery systolic pressure.   3. Left atrial size was severely dilated.   4. The mitral valve is normal in structure. Trivial mitral valve  regurgitation. No evidence of mitral stenosis.   5. The aortic valve is tricuspid. Aortic valve regurgitation is not  visualized. No aortic stenosis is present.   6. The inferior vena cava is normal in size with greater than 50%  respiratory variability, suggesting right atrial pressure of 3 mmHg.   Laboratory Data:  High Sensitivity Troponin:   Recent Labs  Lab 08/24/21 0155 08/24/21 0351 08/24/21 0710 08/24/21 0946 08/24/21 1143  TROPONINIHS 96* 155* 177* 169* 177*     Chemistry Recent Labs  Lab 08/24/21 0155 08/24/21 0458  NA 136 138  K 3.4* 4.2  CL 103 107  CO2 24 25  GLUCOSE 249* 83  BUN 19 22*  CREATININE 1.32* 1.50*  CALCIUM 8.4* 8.0*  MG  --  1.9  GFRNONAA >60 >60  ANIONGAP 9 6    Recent Labs  Lab 08/24/21 0155 08/24/21 0458  PROT 7.6 6.3*  ALBUMIN 3.9 3.4*  AST 34 30  ALT 22 19  ALKPHOS 96 77  BILITOT 0.7 0.6   Lipids No results for input(s): CHOL, TRIG, HDL, LABVLDL, LDLCALC, CHOLHDL in the last 168 hours.  Hematology Recent Labs  Lab 08/24/21 0155 08/24/21 0458  WBC 16.6* 5.8  RBC 4.42 3.64*  HGB 13.3 11.1*  HCT 41.8 33.7*  MCV 94.6 92.6  MCH 30.1 30.5  MCHC 31.8 32.9  RDW 15.6* 15.5  PLT 219 122*   Thyroid  Recent Labs  Lab 08/24/21 0946  TSH 0.786    BNP Recent Labs  Lab 08/24/21 0155  BNP 1,714.0*    DDimer No results for input(s): DDIMER in the last 168 hours.   Radiology/Studies:  DG Abd 1 View  Result Date: 08/24/2021 CLINICAL DATA:  OG tube placement EXAM: ABDOMEN - 1 VIEW COMPARISON:  None. FINDINGS: OG tube appears adequately positioned in the stomach. Paucity of small and large bowel gas within the visualized portion of the  abdomen. No evidence of free intraperitoneal air within the visualized portion of the abdomen. IMPRESSION: OG tube adequately positioned in the stomach. Electronically Signed   By: Franki Cabot M.D.   On: 08/24/2021 05:08   DG Chest Port 1 View  Result Date: 08/24/2021 CLINICAL DATA:  Shortness of breath EXAM: PORTABLE CHEST 1 VIEW COMPARISON:  07/24/2020 FINDINGS: Cardiac shadow is enlarged but stable. New right chest wall port is noted in satisfactory position. Lungs are well aerated bilaterally. Diffuse airspace opacity is noted bilaterally likely related to edema. These changes are new from recent PET-CT from 06/26/2021. No bony abnormality is noted. IMPRESSION: Increase airspace opacity bilaterally likely related to edema. Possibility of lymphomatous involvement deserves consideration as well. These changes are new from prior PET-CT from 06/26/2021. Electronically Signed   By: Inez Catalina M.D.   On: 08/24/2021 02:04   DG Chest Port 1V same Day  Result Date: 08/24/2021 CLINICAL DATA:  Orogastric tube placement EXAM: PORTABLE CHEST 1 VIEW COMPARISON:  08/24/2021 FINDINGS: Lower right lung opacities are unchanged. Unchanged position of right chest wall Port-A-Cath. Orogastric tube tip and side port are below the field of view. No pleural effusion or pneumothorax. IMPRESSION: Orogastric tube tip and side port below the field of view. Electronically Signed   By: Ulyses Jarred M.D.   On: 08/24/2021 03:06   DG Abd Portable 1V  Result Date: 08/24/2021 CLINICAL DATA:  OG tube placement EXAM: PORTABLE ABDOMEN - 1 VIEW COMPARISON:  08/24/2021 FINDINGS: OG tube is in the stomach, unchanged. IMPRESSION: OG tube in the stomach. Electronically Signed   By: Rolm Baptise M.D.   On: 08/24/2021 15:47    Assessment and Plan:   1. Abnormal EKG/Elevated Troponin Values - He does have baseline EKG abnormalities with deep TWI noted on prior tracings. He did have ST changes earlier today showing ST elevation  along V3 to V4 with ST depression along the inferior and lateral leads but Hs Troponin values have overall been flat, peaking at 177. - Will review with MD but could consider a Coronary CT for initial assessment unless enzymes trend upwards. Would await repeat BMET tomorrow morning for reassessment of renal function given the multiple doses of IV Lasix he received today. Echo pending. Continue Heparin for now. He did receive 317m ASA. Will order 870mdaily dosing. Coreg held given soft BP. Will check Hgb A1c and FLP for risk stratification.   2. Acute CHF Exacerbation/HFimpEF - He did have a reduced EF of 40 to 45% by echocardiogram in 03/2019 but this had improved to 55 to 60% by repeat echo in 11/2020. BNP was elevated to 1714 on admission and he has received several doses of IV Lasix today. Would await repeat BMET tomorrow to further guide diuresis.  - Prior echocardiogram imaging was suspicious for cardiac amyloidosis and it was recommended he have a cardiac MRI but this was never obtained as an outpatient.  He would likely not be a candidate for advanced therapies given his multiple comorbidities including B-cell lymphoma, medication noncompliance, HIV and continued substance abuse.  3.  Hypertensive Emergency -  BP was initially elevated to 247/148 but SBP currently at 123/93 following initiation of Fentanyl and Propofol. Agree with holding PTA Coreg, Hydralazine and Imdur for now given his significant decline in BP.  4. B-cell lymphoma  - He was treated with chemo from 10/21 to 12/2020 and is followed by Dr. Delton Coombes as an outpatient.   5. HIV  - Management per the admitting team. He has been continued on PTA Biktarvy.   6. Substance Use - He has a long-standing history of alcohol use and cocaine use with UDS being positive for cocaine this admission. The importance of cessation will need to be reviewed throughout this admission.   For questions or updates, please contact [place] Please consult [email or web address] for contact info under    Signed, Erma Heritage, PA-C  08/24/2021 4:02 PM  Patient seen and examined with Bernerd Pho, PA-C.  Agree as above, with the following exceptions and changes as noted below.  This is a 40 year old male with a past history of heart failure with reduced ejection fraction with subsequent improvement on echo historically, multiple echoes suggesting possible HCM versus infiltrative cardiomyopathy, HIV, B-cell lymphoma with recent chemotherapy, alcohol and cocaine use.  Cardiology consulted for abnormal EKG in the setting of acute respiratory failure.  Patient is currently intubated in the cardiac ICU, and I have seen him in bed 2H22.  Presented to [facility] emergency department earlier this morning with dyspnea and hypoxia with O2 saturations in the 60s, with altered mental status.  Respiratory status requiring positive pressure ventilation and subsequent intubation.  Noted to be markedly hypertensive with a blood pressure of 247/148 on arrival and tachycardic.  UDS positive for cocaine.  Patient has been somewhat noncompliant and has not followed up on cardiology recommendations for further evaluation of massive septal hypertrophy.  Care discussed with patient's bedside nurse team.  Gen: Intubated and sedated, CV: RRR, heart sounds obscured by ventilator breath sounds Lungs: ventilator breath sounds, Abd: soft, Extrem: Warm, well perfused, no edema, Neuro/Psych: intubated and sedated. All available labs, radiology testing, previous records reviewed.  ECG reviewed, and discussed with primary service ECG shows sinus rhythm, biatrial enlargement, LVH, and diffuse ST changes, most notable in the anterolateral leads, with suggestion of ST elevations and T wave inversions.  On review of prior ECGs, this may not be significantly different, and most likely is representative of his underlying cardiomyopathy.  Troponin has been mildly elevated in  the setting of acute respiratory decompensation.  On review of prior chest CT, no coronary calcification seen, and coronary origins and course appear normal.  LVEF on echocardiogram is again mildly reduced.  Given risk factors, he may require an ischemic evaluation which may be accomplished by coronary CTA.  Pending discussion of the patient's goals of care, would consider cardiac MRI to delineate the nature of his massive septal hypertrophy.  On my independent review of the echocardiogram the ventricular septum measures at least 27 mm, which is approaching independent criteria for ICD consideration if the patient is found to have hypertrophic cardiomyopathy.  No evidence of LVOT obstruction or SAM on echo.  Cannot exclude infiltrative cardiomyopathy such as amyloid.  Given asymmetric hypertrophy and severity of hypertrophy ,less likely but still possible that this could represent hypertensive heart disease.  Patient was severely hypertensive on presentation but with sedation and intubation has become normotensive.  Currently sedated with fentanyl and propofol.  Patient presentation is unlikely to represent ACS, however may be reasonable to continue heparin for  48 hours if no contraindication or bleeding.   Continue aspirin 81 mg daily and resume antihypertensive therapy/heart failure therapy when blood pressure is normotensive.  Currently being held for hypotension.    CRITICAL CARE Performed by: Cherlynn Kaiser, MD   Total critical care time: 35 minutes   Critical care time was exclusive of separately billable procedures and treating other patients.   Critical care was necessary to treat or prevent imminent or life-threatening deterioration.   Critical care was time spent personally by me (independent of APPs or residents) on the following activities: development of treatment plan with patient and/or surrogate as well as nursing, discussions with consultants, evaluation of patient's response to  treatment, examination of patient, obtaining history from patient or surrogate, ordering and performing treatments and interventions, ordering and review of laboratory studies, ordering and review of radiographic studies, pulse oximetry and re-evaluation of patient's condition.    Elouise Munroe, MD 08/24/21 4:17 PM

## 2021-08-24 NOTE — Progress Notes (Signed)
Pharmacy Antibiotic Note  Richard Cox is a 40 y.o. male admitted on 08/24/2021 with sepsis.  Pharmacy has been consulted for cefepime dosing.  Plan: Cefepime 2gm IV q8h F/U cxs and clinical progress Monitor V/S, labs  Height: 6\' 1"  (185.4 cm) Weight: 76 kg (167 lb 8.8 oz) IBW/kg (Calculated) : 79.9  Temp (24hrs), Avg:99.9 F (37.7 C), Min:99.1 F (37.3 C), Max:101.1 F (38.4 C)  Recent Labs  Lab 08/24/21 0155 08/24/21 0458 08/24/21 0710 08/24/21 0946  WBC 16.6* 5.8  --   --   CREATININE 1.32* 1.50*  --   --   LATICACIDVEN 3.8* 0.8 0.8 0.7    Estimated Creatinine Clearance: 71.8 mL/min (A) (by C-G formula based on SCr of 1.5 mg/dL (H)).    Allergies  Allergen Reactions   Neosporin [Neomycin-Bacitracin Zn-Polymyx] Rash    Antimicrobials this admission: Cefepime 10/16 >>  Zyvox 10/16>> Vancomycin IV x 1 dose 10/16   Microbiology results: 10/16 BCx: pending 10/16 UCx: pending  Thank you for allowing pharmacy to be a part of this patient's care.  Isac Sarna, BS Pharm D, [place] Clinical Pharmacist Pager [phone] 08/24/2021 10:59 AM

## 2021-08-24 NOTE — ED Provider Notes (Signed)
[facility] EMERGENCY DEPARTMENT Provider Note   CSN: [id number] Arrival date & time: 08/24/21  0146     History Chief Complaint  Patient presents with   Respiratory Distress    Richard Cox is a 40 y.o. male.  Patient is a 40 year old male with past medical history of HIV disease, congestive heart failure, B-cell lymphoma.  Patient presenting today with acute onset of shortness of breath.  Patient found to be in respiratory distress with oxygen saturations in the 60s on room air.  He arrived here extremely anxious and in severe respiratory distress.  He is unable to respond to questions due to acuity of condition.  The history is provided by the patient.      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   Encounter to establish care 07/30/2020   Intra-abdominal lymphadenopathy 07/30/2020   Generalized lymphadenopathy 07/25/2020   Varicella zoster 01/04/2020   AIDS (acquired immune deficiency syndrome) ([place]) 10/10/2019   Healthcare maintenance 10/10/2019   Chronic combined systolic and diastolic CHF (congestive heart failure) ([employer]) 09/18/2019   HIV (human immunodeficiency virus infection) ([place]) 09/18/2019   Polysubstance abuse -cocaine use in remission 09/18/2019   Alcohol abuse 09/18/2019   Tobacco abuse 09/18/2019   Nonspecific serologic evidence of human immunodeficiency virus (HIV) 03/13/2019   Acute exacerbation of CHF (congestive heart failure) ([place]) 03/10/2019   HTN (hypertension) 03/10/2019   Congestive heart failure ([place]) 03/10/2019   SOB (shortness of breath) 03/10/2019   Noncompliance with medication regimen 03/10/2019    Past Surgical History:  Procedure Laterality Date   IR IMAGING GUIDED PORT  INSERTION  08/23/2020   Right   NO PAST SURGERIES         Family History  Problem Relation Age of Onset   Diabetes Mellitus II Mother    Cancer Mother        multiple myeloma   Cancer Father        multiple myeloma   Congestive Heart Failure Brother    Heart Problems Brother    Lupus Brother     Social History   Tobacco Use   Smoking status: Light Smoker    Packs/day: 0.25    Types: Cigarettes   Smokeless tobacco: Never  Vaping Use   Vaping Use: Never used  Substance Use Topics   Alcohol use: Not Currently   Drug use: Not Currently    Types: Marijuana    Comment: x 1 week    Home Medications Prior to Admission medications   Medication Sig Start Date End Date Taking? Authorizing Provider  acetaminophen (TYLENOL) 500 MG tablet Take 1,000 mg by mouth every 6 (six) hours as needed for moderate pain or headache.    [provider]  allopurinol (ZYLOPRIM) 300 MG tablet Take 1 tablet (300 mg total) by mouth daily. 09/30/20   Derek Jack, MD  bictegravir-emtricitabine-tenofovir AF (BIKTARVY) 50-200-25 MG TABS tablet TAKE 1 TABLET BY MOUTH 1 TIME A DAY. 06/19/21   [place], FNP  carvedilol (COREG) 6.25 MG tablet Take 1 tablet (6.25 mg total) by mouth 2 (two) times daily with a meal. 02/25/21   Derek Jack, MD  dicyclomine (BENTYL) 10 MG capsule  07/30/20  [provider]  docusate sodium (COLACE) 100 MG capsule Take 100 mg by mouth daily as needed for mild constipation.    [provider]  furosemide (LASIX) 40 MG tablet Take 1 tablet (40 mg total) by mouth daily. 07/30/20 11/27/20  Lindell Spar, MD  hydrALAZINE (APRESOLINE) 50 MG tablet Take 1 tablet (50 mg total) by mouth 3 (three) times daily. 02/25/21   Derek Jack, MD  isosorbide mononitrate (IMDUR) 30 MG 24 hr tablet Take 30 mg by mouth daily. 04/30/20   [provider]  lidocaine-prilocaine (EMLA) cream Apply 1 application topically as needed. Patient  not taking: Reported on 07/01/2021 08/28/20   Derek Jack, MD  losartan (COZAAR) 50 MG tablet Take 1 tablet (50 mg total) by mouth daily. 02/25/21   Derek Jack, MD  ondansetron (ZOFRAN-ODT) 4 MG disintegrating tablet  07/24/20   [provider]  oxyCODONE (OXY IR/ROXICODONE) 5 MG immediate release tablet Take 1 tablet (5 mg total) by mouth every 12 (twelve) hours as needed for severe pain. 05/22/21   Derek Jack, MD  potassium chloride (KLOR-CON) 20 MEQ tablet Take 0.5 tablets (10 mEq total) by mouth daily. 09/23/20   Derek Jack, MD  prochlorperazine (COMPAZINE) 10 MG tablet Take 1 tablet (10 mg total) by mouth every 6 (six) hours as needed for nausea or vomiting. Patient not taking: Reported on 07/01/2021 12/02/20   Derek Jack, MD    Allergies    Neosporin [neomycin-bacitracin zn-polymyx]  Review of Systems   Review of Systems  Unable to perform ROS: Acuity of condition   Physical Exam Updated Vital Signs BP (!) 226/162   Pulse (!) 147   Resp (!) 32   Ht '6\' 1"'  (1.854 m)   Wt 76 kg   SpO2 93%   BMI 22.11 kg/m   Physical Exam Vitals and nursing note reviewed.  Constitutional:      General: He is in acute distress.     Appearance: He is well-developed. He is ill-appearing and diaphoretic.     Comments: Patient is acutely ill-appearing.  He is in severe respiratory distress, moaning, and writhing.  HENT:     Head: Normocephalic and atraumatic.  Cardiovascular:     Rate and Rhythm: Normal rate and regular rhythm.     Heart sounds: No murmur heard.   No friction rub.  Pulmonary:     Effort: Respiratory distress present.     Breath sounds: Rales present. No wheezing.  Abdominal:     General: Bowel sounds are normal. There is no distension.     Palpations: Abdomen is soft.     Tenderness: There is no abdominal tenderness.  Musculoskeletal:        General: No swelling or tenderness. Normal range of motion.     Cervical back:  Normal range of motion and neck supple.     Right lower leg: No edema.     Left lower leg: No edema.  Skin:    General: Skin is warm.  Neurological:     Mental Status: He is alert and oriented to person, place, and time.     Coordination: Coordination normal.    ED Results / Procedures / Treatments   Labs (all labs ordered are listed, but only abnormal results are displayed) Labs Reviewed  CBC WITH DIFFERENTIAL/PLATELET - Abnormal; Notable for the following components:      Result Value   WBC 16.6 (*)    RDW 15.6 (*)    Neutro Abs 11.1 (*)  Lymphs Abs 4.3 (*)    Abs Immature Granulocytes 0.11 (*)    All other components within normal limits  CBG MONITORING, ED - Abnormal; Notable for the following components:   Glucose-Capillary 240 (*)    All other components within normal limits  COMPREHENSIVE METABOLIC PANEL  BRAIN NATRIURETIC PEPTIDE  TROPONIN I (HIGH SENSITIVITY)    EKG EKG Interpretation  Date/Time:  Sunday August 24 2021 01:49:18 EDT Ventricular Rate:  148 PR Interval:  135 QRS Duration: 93 QT Interval:  301 QTC Calculation: 473 R Axis:   55 Text Interpretation: Sinus tachycardia LAE, consider biatrial enlargement Probable left ventricular hypertrophy Anterior Q waves, possibly due to LVH Abnormal T, consider ischemia, diffuse leads Confirmed by Veryl Speak [phone]) on 08/24/2021 1:51:19 AM  Radiology DG Chest Port 1 View  Result Date: 08/24/2021 CLINICAL DATA:  Shortness of breath EXAM: PORTABLE CHEST 1 VIEW COMPARISON:  07/24/2020 FINDINGS: Cardiac shadow is enlarged but stable. New right chest wall port is noted in satisfactory position. Lungs are well aerated bilaterally. Diffuse airspace opacity is noted bilaterally likely related to edema. These changes are new from recent PET-CT from 06/26/2021. No bony abnormality is noted. IMPRESSION: Increase airspace opacity bilaterally likely related to edema. Possibility of lymphomatous involvement deserves  consideration as well. These changes are new from prior PET-CT from 06/26/2021. Electronically Signed   By: Inez Catalina M.D.   On: 08/24/2021 02:04    Procedures Procedures   Medications Ordered in ED Medications  nitroGLYCERIN 50 mg in dextrose 5 % 250 mL (0.2 mg/mL) infusion (200 mcg/min Intravenous Rate/Dose Change 08/24/21 0207)  propofol (DIPRIVAN) 1000 MG/100ML infusion (5 mcg/kg/min  76 kg Intravenous New Bag/Given 08/24/21 0221)  furosemide (LASIX) injection 40 mg (has no administration in time range)  LORazepam (ATIVAN) injection 2 mg (2 mg Intravenous Given 08/24/21 0147)  furosemide (LASIX) injection 40 mg (40 mg Intravenous Given 08/24/21 0154)    ED Course  I have reviewed the triage vital signs and the nursing notes.  Pertinent labs & imaging results that were available during my care of the patient were reviewed by me and considered in my medical decision making (see chart for details).    MDM Rules/Calculators/A&P  Patient is a 40 year old male with past medical history of HIV disease, B-cell lymphoma, and congestive heart failure.  Patient brought by EMS for evaluation of respiratory distress.  Patient became acutely dyspneic this evening and was found to have oxygen saturations in the 60s.  Upon arrival, patient was in severe respiratory distress, he was moaning, and mental status was altered.  Upon arrival, patient was placed on a nonrebreather with some improvement of his oxygen saturations, however he was extremely anxious and unable to remain still.  He received 2 mg of Ativan with little improvement.  His initial blood pressures were markedly elevated and presentation was most consistent with an exacerbation of CHF.  His chest x-ray appeared to be consistent with pulmonary edema and BNP came back at 1700.  He was started on a nitroglycerin drip and given 80 mg of IV Lasix with minimal diuresis.  He did have some improvement in his blood pressures.  Patient  remained in respiratory distress despite BiPAP and required intubation.  RSI was performed as below.  INTUBATION Performed by: Veryl Speak  Required items: required blood products, implants, devices, and special equipment available Patient identity confirmed: provided demographic data and hospital-assigned identification number Time out: Immediately prior to procedure a "time out" was called to verify  the correct patient, procedure, equipment, support staff and site/side marked as required.  Indications: Respiratory failure  Intubation method: #3 [employer]osh Glidescope Laryngoscopy   Preoxygenation: BiPAP   Sedatives: 20 mg etomidate Paralytic: 150 mg succinylcholine  Tube Size: 7.5 cuffed  Post-procedure assessment: chest rise and ETCO2 monitor Breath sounds: equal and absent over the epigastrium Tube secured with: ETT holder Chest x-ray interpreted by radiologist and me.  Chest x-ray findings: endotracheal tube in appropriate position  Patient tolerated the procedure well with no immediate complications.  Patient started on propofol for sedation.  Not long after, blood pressure started to become somewhat soft and developed low grade fever.  Blood cultures were obtained and broad-spectrum antibiotics administered.  Patient's lactate returned at 3.7 and is of unknown significance.  I did initiate a gentle bolus of fluid.  Care discussed with Dr. Orlin Hilding from the hospitalist service and patient will be admitted to the intensive care unit.  CRITICAL CARE Performed by: Veryl Speak Total critical care time: 70 minutes Critical care time was exclusive of separately billable procedures and treating other patients. Critical care was necessary to treat or prevent imminent or life-threatening deterioration. Critical care was time spent personally by me on the following activities: development of treatment plan with patient and/or surrogate as well as nursing, discussions with consultants,  evaluation of patient's response to treatment, examination of patient, obtaining history from patient or surrogate, ordering and performing treatments and interventions, ordering and review of laboratory studies, ordering and review of radiographic studies, pulse oximetry and re-evaluation of patient's condition.   Final Clinical Impression(s) / ED Diagnoses Final diagnoses:  None    Rx / DC Orders ED Discharge Orders     None        Veryl Speak, MD 08/24/21 [phone]

## 2021-08-24 NOTE — H&P (Signed)
NAME:  Richard Cox, MRN:  [id number], DOB:  January 04, 1981, LOS: 0 ADMISSION DATE:  08/24/2021, CONSULTATION DATE:  08/24/21 REFERRING MD:  [place] Crane, CHIEF COMPLAINT:  dyspnea   History of Present Illness:  40yM with history of HFimpEF (EF 40-45% in 03/2019, at 55-60% by echo in 11/2020), concerns for infiltrative cardiomyopathy (noted on prior echo but cMRI never performed), HIV, B-cell lymphoma (treated with chemo from 10/21 to 12/2020 and followed by Dr. Delton Coombes), substance abuse (alcohol and cocaine) who is admitted with acute hypoxic respiratory failure requiring intubation and mechanical ventilation. He presented to [facility] ED 10/16 for dyspnea BIBEMS. When EMS arrived to evaluate him, had O2 saturation in 60s, placed on BiPAP. On arrival to ED, confursed, pulling mask off, found to be febrile, with remarkable HTN. UDS with cocaine and he endorses use a couple days PTA. Trops 100-200. EKG with newish STEs in precordial leads. He was given full dose aspirin, started on heparin gtt, broad spectrum ABX, nitro gtt, diuresed, intubated and transferred to [facility] on 10/16.  Pertinent  Medical History  HIV B cell lymphoma s/p R-EPOCH 08/2020-12/27/20 Recovered EF Concern for infiltrative CM Substance use  Significant Hospital Events: Including procedures, antibiotic start and stop dates in addition to other pertinent events   10/16 intubated, cardiology consulted, transferred to [facility]  Interim History / Subjective:  Following commands and answering questions appropriately on fentanyl gtt. Does not appear to be in distress.  Objective   Blood pressure (!) 123/93, pulse 87, temperature 99.9 F (37.7 C), resp. rate (!) 28, height 6\' 1"  (1.854 m), weight 76 kg, SpO2 100 %.    Vent Mode: PRVC FiO2 (%):  [40 %-100 %] 40 % Set Rate:  [28 bmp] 28 bmp Vt Set:  [630 mL-640 mL] 640 mL PEEP:  [5 cmH20] 5 cmH20 Plateau Pressure:  [21 cmH20-25 cmH20] 21 cmH20   Intake/Output Summary (Last 24 hours) at 08/24/2021  1347 Last data filed at 08/24/2021 1216 Gross per 24 hour  Intake 1187.72 ml  Output 1500 ml  Net -312.28 ml   Filed Weights   08/24/21 0151  Weight: 76 kg    Examination: General appearance: 40 y.o., male, intubated, sedated Eyes: tracks, pupils equal and reactive HENT: NCAT; dry MM Lungs: coarse mech breath sounds, equal chest rise CV: RRR, warm Abdomen: Soft, non-tender; non-distended, BS present  Extremities: No peripheral edema Skin: Normal temperature, turgor and texture; no rash Neuro: follows commands in all extremities   7.34/44.96 S Cr elevated Trops 96-177 BNP 1714 Leukocytosis +Pyuria   UDS with cocaine  BCx, UCx in process  Last CD4 285 06/19/21   CXR with RLL > LLL/retrocardiac alveolar/interstitial opacities  TTE EF 45-50%, marked asymmetric hypertrophy but no LVOT obst, G2DD  EKG with STEs v2-v5, ST depressions inferior leads   Resolved Hospital Problem list   N/a  Assessment & Plan:   # ST elevations # Troponinemia Borderline STEs but with low level troponin doesn't seem like plaque rupture. STEs could alternatively be related to CM and cocaine exposure. - s/p full dose asa - heparin gtt - cardiology consulted  # Acute hypoxic respiratory failure # Likely flash pulmonary edema # Sepsis due to community acquired pneumonia or UTI May have represented flash edema in setting cocaine use, hypertrophic CM, ?ACS or cocaine related MI. May also have aspiration pneumonitis or PNA but secretions don't seem that bad currently and vent settings are minimal. Given minimal settings, normal RV on TTE and possible need for LHC would hold  off on PE evaluation for now.  - full vent support - SAT/SBT in AM - tracheal aspirate, BCx - LZD/zosyn, follow cultures and narrow as able - although immunosuppressed will hold off on BAL in setting possible ACS - net negative fluid balance - given minimal vent settings ok with RASS 0 to -1  # AKI - check CK -  ctm reponse to initial resuscitative measures  # HIV - biktarvy - will hold off on checking CD4 now as this may be transiently depressed in setting acute illness but defer to ID - ID       Best Practice (right click and "Reselect all SmartList Selections" daily)   Diet/type: NPO w/ meds via tube DVT prophylaxis: systemic heparin GI prophylaxis: H2B Lines: yes and it is still needed - he has a port Foley:  N/A Code Status:  full code Last date of multidisciplinary goals of care discussion [to be updated today]  Labs   CBC: Recent Labs  Lab 08/24/21 0155 08/24/21 0458  WBC 16.6* 5.8  NEUTROABS 11.1*  --   HGB 13.3 11.1*  HCT 41.8 33.7*  MCV 94.6 92.6  PLT 219 122*    Basic Metabolic Panel: Recent Labs  Lab 08/24/21 0155 08/24/21 0458  NA 136 138  K 3.4* 4.2  CL 103 107  CO2 24 25  GLUCOSE 249* 83  BUN 19 22*  CREATININE 1.32* 1.50*  CALCIUM 8.4* 8.0*  MG  --  1.9   GFR: Estimated Creatinine Clearance: 71.8 mL/min (A) (by C-G formula based on SCr of 1.5 mg/dL (H)). Recent Labs  Lab 08/24/21 0155 08/24/21 0458 08/24/21 0710 08/24/21 0946 08/24/21 1143  PROCALCITON  --   --   --   --  6.97  WBC 16.6* 5.8  --   --   --   LATICACIDVEN 3.8* 0.8 0.8 0.7  --     Liver Function Tests: Recent Labs  Lab 08/24/21 0155 08/24/21 0458  AST 34 30  ALT 22 19  ALKPHOS 96 77  BILITOT 0.7 0.6  PROT 7.6 6.3*  ALBUMIN 3.9 3.4*   No results for input(s): LIPASE, AMYLASE in the last 168 hours. No results for input(s): AMMONIA in the last 168 hours.  ABG    Component Value Date/Time   PHART 7.344 (L) 08/24/2021 0500   PCO2ART 44.0 08/24/2021 0500   PO2ART 96.4 08/24/2021 0500   HCO3 22.8 08/24/2021 0500   ACIDBASEDEF 1.6 08/24/2021 0500   O2SAT 95.9 08/24/2021 0500     Coagulation Profile: No results for input(s): INR, PROTIME in the last 168 hours.  Cardiac Enzymes: No results for input(s): CKTOTAL, CKMB, CKMBINDEX, TROPONINI in the last 168  hours.  HbA1C: Hgb A1c MFr Bld  Date/Time Value Ref Range Status  09/18/2019 12:36 AM 5.5 4.8 - 5.6 % Final    Comment:    (NOTE) Pre diabetes:          5.7%-6.4% Diabetes:              >6.4% Glycemic control for   <7.0% adults with diabetes     CBG: Recent Labs  Lab 08/24/21 0150  GLUCAP 240*    Review of Systems:   Unable to obtain as he is intubated and sedated at time of my evaluation  Past Medical History:  He,  has a past medical history of CHF (congestive heart failure) ([place]), Essential hypertension, HIV infection ([place]), Noncompliance with medication regimen, and Secondary cardiomyopathy ([place]).   Surgical History:  Past Surgical History:  Procedure Laterality Date   IR IMAGING GUIDED PORT INSERTION  08/23/2020   Right   NO PAST SURGERIES       Social History:   reports that he has been smoking cigarettes. He has been smoking an average of .25 packs per day. He has never used smokeless tobacco. He reports that he does not currently use alcohol. He reports that he does not currently use drugs after having used the following drugs: Marijuana.   Family History:  His family history includes Cancer in his father and mother; Congestive Heart Failure in his brother; Diabetes Mellitus II in his mother; Heart Problems in his brother; Lupus in his brother.   Allergies Allergies  Allergen Reactions   Neosporin [Neomycin-Bacitracin Zn-Polymyx] Rash     Home Medications  Prior to Admission medications   Medication Sig Start Date End Date Taking? Authorizing Provider  acetaminophen (TYLENOL) 500 MG tablet Take 1,000 mg by mouth every 6 (six) hours as needed for moderate pain or headache.    [provider]  allopurinol (ZYLOPRIM) 300 MG tablet Take 1 tablet (300 mg total) by mouth daily. 09/30/20   Derek Jack, MD  bictegravir-emtricitabine-tenofovir AF (BIKTARVY) 50-200-25 MG TABS tablet TAKE 1 TABLET BY MOUTH 1 TIME A DAY. 06/19/21   [place], FNP  carvedilol (COREG) 6.25 MG tablet Take 1 tablet (6.25 mg total) by mouth 2 (two) times daily with a meal. 02/25/21   Derek Jack, MD  dicyclomine (BENTYL) 10 MG capsule  07/30/20   [provider]  docusate sodium (COLACE) 100 MG capsule Take 100 mg by mouth daily as needed for mild constipation.    [provider]  furosemide (LASIX) 40 MG tablet Take 1 tablet (40 mg total) by mouth daily. 07/30/20 11/27/20  Lindell Spar, MD  hydrALAZINE (APRESOLINE) 50 MG tablet Take 1 tablet (50 mg total) by mouth 3 (three) times daily. 02/25/21   Derek Jack, MD  isosorbide mononitrate (IMDUR) 30 MG 24 hr tablet Take 30 mg by mouth daily. 04/30/20   [provider]  lidocaine-prilocaine (EMLA) cream Apply 1 application topically as needed. Patient not taking: Reported on 07/01/2021 08/28/20   Derek Jack, MD  losartan (COZAAR) 50 MG tablet Take 1 tablet (50 mg total) by mouth daily. 02/25/21   Derek Jack, MD  ondansetron (ZOFRAN-ODT) 4 MG disintegrating tablet  07/24/20   [provider]  oxyCODONE (OXY IR/ROXICODONE) 5 MG immediate release tablet Take 1 tablet (5 mg total) by mouth every 12 (twelve) hours as needed for severe pain. 05/22/21   Derek Jack, MD  potassium chloride (KLOR-CON) 20 MEQ tablet Take 0.5 tablets (10 mEq total) by mouth daily. 09/23/20   Derek Jack, MD  prochlorperazine (COMPAZINE) 10 MG tablet Take 1 tablet (10 mg total) by mouth every 6 (six) hours as needed for nausea or vomiting. Patient not taking: Reported on 07/01/2021 12/02/20   Derek Jack, MD     Critical care time: 45 minutes

## 2021-08-24 NOTE — Progress Notes (Signed)
[place] Progress Note Patient Name: Richard Cox DOB: 1981-02-21 MRN: [id number]   Date of Service  08/24/2021  HPI/Events of Note  Camera e lert: Discussed with RN. On OG TF, threw up 200 ml. On Vent. VS hypertensive, getting more sedation. Asking for CxR and KUB.  eICU Interventions  Both ordered. Hold TF for tonight. Already on antibiotics.      Intervention Category Major Interventions: Other:  Elmer Sow 08/24/2021, 11:06 PM

## 2021-08-24 NOTE — Plan of Care (Signed)

## 2021-08-24 NOTE — Progress Notes (Signed)
Brief Nutrition Note  Consult received for enteral/tube feeding initiation and management.  Adult Enteral Nutrition Protocol initiated. Full assessment to follow.  Admitting Dx: Flash pulmonary edema ([place]) [J81.0] Encounter for imaging study to confirm orogastric (OG) tube placement [Z01.89] Acute febrile illness [R50.9] Encounter for intubation [Z01.818] Acute respiratory failure with hypoxia ([place]) [J96.01] Endotracheally intubated [Z97.8] Acute respiratory failure with hypoxia and hypercapnia ([place]) [J96.01, J96.02]  Body mass index is 22.11 kg/m. Pt meets criteria for normal based on current BMI.  Labs:  Recent Labs  Lab 08/24/21 0155 08/24/21 0458  NA 136 138  K 3.4* 4.2  CL 103 107  CO2 24 25  BUN 19 22*  CREATININE 1.32* 1.50*  CALCIUM 8.4* 8.0*  MG  --  1.9  GLUCOSE 249* 83    Zeppelin Commisso, MS, RD, LDN (she/her/hers) RD pager number and weekend/on-call pager number located in [place].

## 2021-08-25 DIAGNOSIS — Z9911 Dependence on respirator [ventilator] status: Secondary | ICD-10-CM

## 2021-08-25 DIAGNOSIS — I5043 Acute on chronic combined systolic (congestive) and diastolic (congestive) heart failure: Secondary | ICD-10-CM | POA: Diagnosis not present

## 2021-08-25 DIAGNOSIS — I5031 Acute diastolic (congestive) heart failure: Secondary | ICD-10-CM

## 2021-08-25 DIAGNOSIS — J810 Acute pulmonary edema: Secondary | ICD-10-CM

## 2021-08-25 DIAGNOSIS — I161 Hypertensive emergency: Secondary | ICD-10-CM

## 2021-08-25 DIAGNOSIS — J9601 Acute respiratory failure with hypoxia: Secondary | ICD-10-CM

## 2021-08-25 DIAGNOSIS — R778 Other specified abnormalities of plasma proteins: Secondary | ICD-10-CM | POA: Diagnosis not present

## 2021-08-25 LAB — URINE CULTURE: Culture: NO GROWTH

## 2021-08-25 LAB — CBC
HCT: 30 % — ABNORMAL LOW (ref 39.0–52.0)
Hemoglobin: 10 g/dL — ABNORMAL LOW (ref 13.0–17.0)
MCH: 29.9 pg (ref 26.0–34.0)
MCHC: 33.3 g/dL (ref 30.0–36.0)
MCV: 89.6 fL (ref 80.0–100.0)
Platelets: 104 10*3/uL — ABNORMAL LOW (ref 150–400)
RBC: 3.35 MIL/uL — ABNORMAL LOW (ref 4.22–5.81)
RDW: 15.6 % — ABNORMAL HIGH (ref 11.5–15.5)
WBC: 4.2 10*3/uL (ref 4.0–10.5)
nRBC: 0 % (ref 0.0–0.2)

## 2021-08-25 LAB — POCT I-STAT 7, (LYTES, BLD GAS, ICA,H+H)
Acid-Base Excess: 2 mmol/L (ref 0.0–2.0)
Bicarbonate: 27 mmol/L (ref 20.0–28.0)
Calcium, Ion: 1.2 mmol/L (ref 1.15–1.40)
HCT: 31 % — ABNORMAL LOW (ref 39.0–52.0)
Hemoglobin: 10.5 g/dL — ABNORMAL LOW (ref 13.0–17.0)
O2 Saturation: 99 %
Patient temperature: 99.1
Potassium: 3.8 mmol/L (ref 3.5–5.1)
Sodium: 138 mmol/L (ref 135–145)
TCO2: 28 mmol/L (ref 22–32)
pCO2 arterial: 41.6 mmHg (ref 32.0–48.0)
pH, Arterial: 7.422 (ref 7.350–7.450)
pO2, Arterial: 134 mmHg — ABNORMAL HIGH (ref 83.0–108.0)

## 2021-08-25 LAB — GLUCOSE, CAPILLARY
Glucose-Capillary: 105 mg/dL — ABNORMAL HIGH (ref 70–99)
Glucose-Capillary: 119 mg/dL — ABNORMAL HIGH (ref 70–99)
Glucose-Capillary: 127 mg/dL — ABNORMAL HIGH (ref 70–99)
Glucose-Capillary: 87 mg/dL (ref 70–99)
Glucose-Capillary: 90 mg/dL (ref 70–99)
Glucose-Capillary: 97 mg/dL (ref 70–99)

## 2021-08-25 LAB — COMPREHENSIVE METABOLIC PANEL
ALT: 16 U/L (ref 0–44)
AST: 18 U/L (ref 15–41)
Albumin: 2.9 g/dL — ABNORMAL LOW (ref 3.5–5.0)
Alkaline Phosphatase: 72 U/L (ref 38–126)
Anion gap: 9 (ref 5–15)
BUN: 25 mg/dL — ABNORMAL HIGH (ref 6–20)
CO2: 22 mmol/L (ref 22–32)
Calcium: 8.5 mg/dL — ABNORMAL LOW (ref 8.9–10.3)
Chloride: 106 mmol/L (ref 98–111)
Creatinine, Ser: 1.35 mg/dL — ABNORMAL HIGH (ref 0.61–1.24)
GFR, Estimated: >60 mL/min (ref >60)
Glucose, Bld: 94 mg/dL (ref 70–99)
Potassium: 3.8 mmol/L (ref 3.5–5.1)
Sodium: 137 mmol/L (ref 135–145)
Total Bilirubin: 0.8 mg/dL (ref 0.3–1.2)
Total Protein: 5.9 g/dL — ABNORMAL LOW (ref 6.5–8.1)

## 2021-08-25 LAB — HEMOGLOBIN A1C
Hgb A1c MFr Bld: 5.1 % (ref 4.8–5.6)
Mean Plasma Glucose: 99.67 mg/dL

## 2021-08-25 LAB — TRIGLYCERIDES: Triglycerides: 120 mg/dL (ref <150)

## 2021-08-25 LAB — LIPID PANEL
Cholesterol: 183 mg/dL (ref 0–200)
HDL: 25 mg/dL — ABNORMAL LOW (ref >40)
LDL Cholesterol: 134 mg/dL — ABNORMAL HIGH (ref 0–99)
Total CHOL/HDL Ratio: 7.3 RATIO
Triglycerides: 121 mg/dL (ref <150)
VLDL: 24 mg/dL (ref 0–40)

## 2021-08-25 LAB — MAGNESIUM: Magnesium: 2 mg/dL (ref 1.7–2.4)

## 2021-08-25 LAB — HEPARIN LEVEL (UNFRACTIONATED): Heparin Unfractionated: <0.1 IU/mL — ABNORMAL LOW (ref 0.30–0.70)

## 2021-08-25 LAB — PHOSPHORUS: Phosphorus: 3.2 mg/dL (ref 2.5–4.6)

## 2021-08-25 MED ORDER — HYDRALAZINE HCL 50 MG PO TABS
50.0000 mg | ORAL_TABLET | Freq: Three times a day (TID) | ORAL | Status: DC
  Administered 2021-08-25 – 2021-08-27 (×7): 50 mg via ORAL
  Filled 2021-08-25 (×7): qty 1

## 2021-08-25 MED ORDER — ASPIRIN 81 MG PO CHEW
81.0000 mg | CHEWABLE_TABLET | Freq: Every day | ORAL | Status: DC
  Administered 2021-08-26 – 2021-08-27 (×2): 81 mg via ORAL
  Filled 2021-08-25 (×2): qty 1

## 2021-08-25 MED ORDER — POTASSIUM CHLORIDE CRYS ER 20 MEQ PO TBCR
40.0000 meq | EXTENDED_RELEASE_TABLET | Freq: Once | ORAL | Status: AC
  Administered 2021-08-25: 40 meq via ORAL
  Filled 2021-08-25: qty 2

## 2021-08-25 MED ORDER — UMECLIDINIUM-VILANTEROL 62.5-25 MCG/INH IN AEPB
1.0000 | INHALATION_SPRAY | Freq: Every day | RESPIRATORY_TRACT | Status: DC
  Administered 2021-08-26 – 2021-08-27 (×2): 1 via RESPIRATORY_TRACT
  Filled 2021-08-25: qty 14

## 2021-08-25 MED ORDER — CHLORHEXIDINE GLUCONATE CLOTH 2 % EX PADS: 6.0000 | MEDICATED_PAD | Freq: Every day | CUTANEOUS | Status: DC

## 2021-08-25 MED ORDER — BOOST / RESOURCE BREEZE PO LIQD CUSTOM
1.0000 | Freq: Three times a day (TID) | ORAL | Status: DC
  Administered 2021-08-25 – 2021-08-27 (×7): 1 via ORAL

## 2021-08-25 MED ORDER — FUROSEMIDE 10 MG/ML IJ SOLN
40.0000 mg | Freq: Once | INTRAMUSCULAR | Status: AC
  Administered 2021-08-25: 40 mg via INTRAVENOUS
  Filled 2021-08-25: qty 4

## 2021-08-25 MED ORDER — POLYETHYLENE GLYCOL 3350 17 G PO PACK
17.0000 g | PACK | Freq: Every day | ORAL | Status: DC
  Administered 2021-08-26 – 2021-08-27 (×2): 17 g via ORAL
  Filled 2021-08-25 (×2): qty 1

## 2021-08-25 MED ORDER — HEPARIN SODIUM (PORCINE) 5000 UNIT/ML IJ SOLN
5000.0000 [IU] | Freq: Three times a day (TID) | INTRAMUSCULAR | Status: DC
  Administered 2021-08-25 – 2021-08-27 (×7): 5000 [IU] via SUBCUTANEOUS
  Filled 2021-08-25 (×7): qty 1

## 2021-08-25 MED ORDER — CARVEDILOL 6.25 MG PO TABS: 6.2500 mg | ORAL_TABLET | Freq: Two times a day (BID) | ORAL | Status: DC

## 2021-08-25 MED ORDER — HEPARIN BOLUS VIA INFUSION
2500.0000 [IU] | Freq: Once | INTRAVENOUS | Status: AC
  Administered 2021-08-25: 2500 [IU] via INTRAVENOUS
  Filled 2021-08-25: qty 2500

## 2021-08-25 MED ORDER — ORAL CARE MOUTH RINSE
15.0000 mL | Freq: Two times a day (BID) | OROMUCOSAL | Status: DC
  Administered 2021-08-25 – 2021-08-27 (×3): 15 mL via OROMUCOSAL

## 2021-08-25 MED ORDER — FUROSEMIDE 10 MG/ML IJ SOLN
40.0000 mg | Freq: Two times a day (BID) | INTRAMUSCULAR | Status: AC
  Administered 2021-08-25 – 2021-08-26 (×3): 40 mg via INTRAVENOUS
  Filled 2021-08-25 (×3): qty 4

## 2021-08-25 MED ORDER — DOCUSATE SODIUM 50 MG/5ML PO LIQD: 100.0000 mg | Freq: Two times a day (BID) | ORAL | Status: DC

## 2021-08-25 MED ORDER — ACETAMINOPHEN 160 MG/5ML PO SOLN
650.0000 mg | Freq: Four times a day (QID) | ORAL | Status: DC | PRN
  Filled 2021-08-25: qty 20.3

## 2021-08-25 MED ORDER — CARVEDILOL 6.25 MG PO TABS
6.2500 mg | ORAL_TABLET | Freq: Two times a day (BID) | ORAL | Status: DC
  Administered 2021-08-25 – 2021-08-26 (×4): 6.25 mg via ORAL
  Filled 2021-08-25 (×4): qty 1

## 2021-08-25 MED ORDER — UMECLIDINIUM-VILANTEROL 62.5-25 MCG/INH IN AEPB
1.0000 | INHALATION_SPRAY | Freq: Every day | RESPIRATORY_TRACT | Status: DC
  Filled 2021-08-25: qty 14

## 2021-08-25 MED ORDER — ACETAMINOPHEN 650 MG RE SUPP: 650.0000 mg | Freq: Four times a day (QID) | RECTAL | Status: DC | PRN

## 2021-08-25 NOTE — Progress Notes (Signed)
Progress Note  Patient Name: Richard Cox Date of Encounter: 08/25/2021  [facility] Cardiologist: Rozann Lesches, MD   Subjective   Extubated.  No pain.  Reports that he has had trouble staying on his blood pressure medications because he does not have a regular doctor.  Inpatient Medications    Scheduled Meds:  aspirin  81 mg Per Tube Daily   bictegravir-emtricitabine-tenofovir AF  1 tablet Oral Daily   carvedilol  6.25 mg Oral BID WC   chlorhexidine gluconate (MEDLINE KIT)  15 mL Mouth Rinse BID   Chlorhexidine Gluconate Cloth  6 each Topical Daily   docusate  100 mg Per Tube BID   furosemide  40 mg Intravenous Once   hydrALAZINE  50 mg Oral Q8H   mouth rinse  15 mL Mouth Rinse 10 times per day   polyethylene glycol  17 g Per Tube Daily   Continuous Infusions:  ceFEPime (MAXIPIME) IV Stopped (08/25/21 0624)   heparin 1,550 Units/hr (08/25/21 0800)   PRN Meds: acetaminophen **OR** acetaminophen, albuterol, [DISCONTINUED] ondansetron **OR** ondansetron (ZOFRAN) IV   Vital Signs    Vitals:   08/25/21 0730 08/25/21 0800 08/25/21 0826 08/25/21 0830  BP: (!) 134/92 (!) 120/110  (!) 145/103  Pulse: 65 76 66 81  Resp: (!) 28 (!) 26 (!) 28 (!) 25  Temp: 99 F (37.2 C) 98.8 F (37.1 C) 99 F (37.2 C) 98.6 F (37 C)  TempSrc:      SpO2: 98% 100% 100% 100%  Weight:      Height:        Intake/Output Summary (Last 24 hours) at 08/25/2021 0909 Last data filed at 08/25/2021 0800 Gross per 24 hour  Intake 2035.2 ml  Output 2705 ml  Net -669.8 ml   Last 3 Weights 08/25/2021 08/24/2021 08/24/2021  Weight (lbs) 182 lb 1.6 oz 182 lb 1.6 oz 167 lb 8.8 oz  Weight (kg) 82.6 kg 82.6 kg 76 kg      Telemetry    Normal sinus rhythm- Personally Reviewed  ECG    Normal sinus rhythm with LVH, strain pattern- Personally Reviewed  Physical Exam   GEN: No acute distress.   Neck: No JVD Cardiac: RRR, no murmurs, rubs, or gallops.  Respiratory: Clear to auscultation  bilaterally. GI: Soft, nontender, non-distended  MS: No edema; No deformity. Neuro:  Nonfocal  Psych: Normal affect   Labs    High Sensitivity Troponin:   Recent Labs  Lab 08/24/21 0155 08/24/21 0351 08/24/21 0710 08/24/21 0946 08/24/21 1143  TROPONINIHS 96* 155* 177* 169* 177*     Chemistry Recent Labs  Lab 08/24/21 0155 08/24/21 0458 08/24/21 1855 08/25/21 0336 08/25/21 0410  NA 136 138  --  137 138  K 3.4* 4.2  --  3.8 3.8  CL 103 107  --  106  --   CO2 24 25  --  22  --   GLUCOSE 249* 83  --  94  --   BUN 19 22*  --  25*  --   CREATININE 1.32* 1.50*  --  1.35*  --   CALCIUM 8.4* 8.0*  --  8.5*  --   MG  --  1.9 2.0 2.0  --   PROT 7.6 6.3*  --  5.9*  --   ALBUMIN 3.9 3.4*  --  2.9*  --   AST 34 30  --  18  --   ALT 22 19  --  16  --   ALKPHOS  96 77  --  72  --   BILITOT 0.7 0.6  --  0.8  --   GFRNONAA >60 >60  --  >60  --   ANIONGAP 9 6  --  9  --     Lipids  Recent Labs  Lab 08/25/21 0336  CHOL 183  TRIG 121  120  HDL 25*  LDLCALC 134*  CHOLHDL 7.3    Hematology Recent Labs  Lab 08/24/21 0155 08/24/21 0458 08/25/21 0336 08/25/21 0410  WBC 16.6* 5.8 4.2  --   RBC 4.42 3.64* 3.35*  --   HGB 13.3 11.1* 10.0* 10.5*  HCT 41.8 33.7* 30.0* 31.0*  MCV 94.6 92.6 89.6  --   MCH 30.1 30.5 29.9  --   MCHC 31.8 32.9 33.3  --   RDW 15.6* 15.5 15.6*  --   PLT 219 122* 104*  --    Thyroid  Recent Labs  Lab 08/24/21 0946  TSH 0.786    BNP Recent Labs  Lab 08/24/21 0155  BNP 1,714.0*    DDimer No results for input(s): DDIMER in the last 168 hours.   Radiology    DG Chest 1 View  Result Date: 08/24/2021 CLINICAL DATA:  Confirm existing ETT placement. EXAM: CHEST  1 VIEW COMPARISON:  Chest radiograph 08/24/2021 FINDINGS: The endotracheal tube tip terminates between the thoracic inlet and carina. A nasogastric tube remains in place. Unchanged position of a right chest Port-A-Cath. Improved aeration of the bilateral lungs. There is a persistent  opacity in the right base. No pneumothorax. No large pleural effusion. No acute finding in the visualized skeleton. IMPRESSION: 1. Endotracheal tube tip terminates between the thoracic inlet and carina. 2. Improved aeration of the bilateral lungs with persistent right basilar opacity. Electronically Signed   By: Audie Pinto M.D.   On: 08/24/2021 16:53   DG Abd 1 View  Result Date: 08/24/2021 CLINICAL DATA:  Recent vomiting EXAM: ABDOMEN - 1 VIEW COMPARISON:  None. FINDINGS: Gastric catheter is noted within the stomach. Scattered bowel gas is noted without obstructive pattern. No bony abnormality is seen. IMPRESSION: No acute abnormality noted. Electronically Signed   By: Inez Catalina M.D.   On: 08/24/2021 23:38   DG Abd 1 View  Result Date: 08/24/2021 CLINICAL DATA:  OG tube placement EXAM: ABDOMEN - 1 VIEW COMPARISON:  None. FINDINGS: OG tube appears adequately positioned in the stomach. Paucity of small and large bowel gas within the visualized portion of the abdomen. No evidence of free intraperitoneal air within the visualized portion of the abdomen. IMPRESSION: OG tube adequately positioned in the stomach. Electronically Signed   By: Franki Cabot M.D.   On: 08/24/2021 05:08   DG CHEST PORT 1 VIEW  Result Date: 08/24/2021 CLINICAL DATA:  Recent vomiting EXAM: PORTABLE CHEST 1 VIEW COMPARISON:  08/24/2021 FINDINGS: Cardiac shadow is stable. Endotracheal tube and gastric catheter are noted in satisfactory position. Right-sided chest wall port is noted in satisfactory position. Lungs are well aerated bilaterally with some persistent right basilar opacity stable from the previous exam. No bony abnormality is seen. IMPRESSION: Stable right basilar airspace opacity. Tubes and lines unchanged from the prior study. Electronically Signed   By: Inez Catalina M.D.   On: 08/24/2021 23:32   DG Chest Port 1 View  Result Date: 08/24/2021 CLINICAL DATA:  Shortness of breath EXAM: PORTABLE CHEST 1 VIEW  COMPARISON:  07/24/2020 FINDINGS: Cardiac shadow is enlarged but stable. New right chest wall port is noted in  satisfactory position. Lungs are well aerated bilaterally. Diffuse airspace opacity is noted bilaterally likely related to edema. These changes are new from recent PET-CT from 06/26/2021. No bony abnormality is noted. IMPRESSION: Increase airspace opacity bilaterally likely related to edema. Possibility of lymphomatous involvement deserves consideration as well. These changes are new from prior PET-CT from 06/26/2021. Electronically Signed   By: Inez Catalina M.D.   On: 08/24/2021 02:04   DG Chest Port 1V same Day  Result Date: 08/24/2021 CLINICAL DATA:  Orogastric tube placement EXAM: PORTABLE CHEST 1 VIEW COMPARISON:  08/24/2021 FINDINGS: Lower right lung opacities are unchanged. Unchanged position of right chest wall Port-A-Cath. Orogastric tube tip and side port are below the field of view. No pleural effusion or pneumothorax. IMPRESSION: Orogastric tube tip and side port below the field of view. Electronically Signed   By: Ulyses Jarred M.D.   On: 08/24/2021 03:06   DG Abd Portable 1V  Result Date: 08/24/2021 CLINICAL DATA:  OG tube placement EXAM: PORTABLE ABDOMEN - 1 VIEW COMPARISON:  08/24/2021 FINDINGS: OG tube is in the stomach, unchanged. IMPRESSION: OG tube in the stomach. Electronically Signed   By: Rolm Baptise M.D.   On: 08/24/2021 15:47   ECHOCARDIOGRAM COMPLETE  Result Date: 08/24/2021    ECHOCARDIOGRAM REPORT   Patient Name:   BARTLOMIEJ JENKINSON Date of Exam: 08/24/2021 Medical Rec #:  [id number]  Height:       73.0 in Accession #:    [id number] Weight:       167.5 lb Date of Birth:  07-19-1981   BSA:          1.996 m Patient Age:    14 years   BP:           141/93 mmHg Patient Gender: M          HR:           89 bpm. Exam Location:  [facility] Procedure: 2D Echo, Color Doppler and Cardiac Doppler Indications:    CHF  History:        Patient has prior history of Echocardiogram  examinations, most                 recent 11/18/2020. CHF; Risk Factors:Hypertension and Current                 Smoker. HIV, Substance Abuse.  Sonographer:    Leavy Cella RDCS Referring Phys: [id number] ASIA B [place]  1. There is severe asymmetric hypertrophy of the septum up to 29 mm on this study. The PW is severely hypertrophied up to 20 mm. There is no SAM of the mitral valve. There is no LVOT obstruction. Findings could represent hypertrophic cardiomyopathy versus other infiltrative process such as cardiac amyloidosis. Would recommend cardiac MRI for clarification. EF appears to be mildly reduced on this study, EF 45-50%. Left ventricular ejection fraction, by estimation, is 45 to 50%. The left ventricle has mildly decreased function. The left ventricle demonstrates global hypokinesis. There is severe asymmetric left ventricular hypertrophy of the septal segment. Left ventricular diastolic parameters are consistent with Grade II diastolic dysfunction (pseudonormalization).  2. Right ventricular systolic function is normal. The right ventricular size is normal. Tricuspid regurgitation signal is inadequate for assessing PA pressure.  3. Left atrial size was moderately dilated.  4. The mitral valve is grossly normal. No evidence of mitral valve regurgitation. No evidence of mitral stenosis.  5. The aortic valve is tricuspid. Aortic valve regurgitation is not visualized.  No aortic stenosis is present. Comparison(s): Changes from prior study are noted. Severe asymmetric hypertrophy remains. EF 45-50% on this study. FINDINGS  Left Ventricle: There is severe asymmetric hypertrophy of the septum up to 29 mm on this study. The PW is severely hypertrophied up to 20 mm. There is no SAM of the mitral valve. There is no LVOT obstruction. Findings could represent hypertrophic cardiomyopathy versus other infiltrative process such as cardiac amyloidosis. Would recommend cardiac MRI for clarification. EF  appears to be mildly reduced on this study, EF 45-50%. Left ventricular ejection fraction, by estimation, is 45 to 50%. The left ventricle has mildly decreased function. The left ventricle demonstrates global hypokinesis. The left ventricular internal cavity size was small. There is severe asymmetric left ventricular hypertrophy of the septal segment. Left ventricular diastolic parameters are consistent with Grade II diastolic dysfunction (pseudonormalization). Right Ventricle: The right ventricular size is normal. No increase in right ventricular wall thickness. Right ventricular systolic function is normal. Tricuspid regurgitation signal is inadequate for assessing PA pressure. Left Atrium: Left atrial size was moderately dilated. Right Atrium: Right atrial size was normal in size. Pericardium: Trivial pericardial effusion is present. Mitral Valve: The mitral valve is grossly normal. No evidence of mitral valve regurgitation. No evidence of mitral valve stenosis. Tricuspid Valve: The tricuspid valve is grossly normal. Tricuspid valve regurgitation is trivial. No evidence of tricuspid stenosis. Aortic Valve: The aortic valve is tricuspid. Aortic valve regurgitation is not visualized. No aortic stenosis is present. Pulmonic Valve: The pulmonic valve was grossly normal. Pulmonic valve regurgitation is not visualized. No evidence of pulmonic stenosis. Aorta: The aortic root and ascending aorta are structurally normal, with no evidence of dilitation. Venous: IVC assessment for right atrial pressure unable to be performed due to mechanical ventilation. IAS/Shunts: The atrial septum is grossly normal.  LEFT VENTRICLE PLAX 2D LVIDd:         4.20 cm   Diastology LVIDs:         3.30 cm   LV e' medial:    3.44 cm/s LV PW:         2.50 cm   LV E/e' medial:  16.3 LV IVS:        2.90 cm   LV e' lateral:   4.25 cm/s LVOT diam:     2.50 cm   LV E/e' lateral: 13.2 LV SV:         60 LV SV Index:   30 LVOT Area:     4.91 cm  RIGHT  VENTRICLE RV Basal diam:  3.90 cm RV Mid diam:    3.40 cm RV S prime:     13.60 cm/s TAPSE (M-mode): 2.0 cm LEFT ATRIUM           Index        RIGHT ATRIUM           Index LA diam:      3.80 cm 1.90 cm/m   RA Area:     15.80 cm LA Vol (A2C): 46.1 ml 23.10 ml/m  RA Volume:   43.10 ml  21.60 ml/m LA Vol (A4C): 94.8 ml 47.50 ml/m  AORTIC VALVE LVOT Vmax:   85.30 cm/s LVOT Vmean:  50.800 cm/s LVOT VTI:    0.123 m  AORTA Ao Root diam: 3.40 cm MITRAL VALVE MV Area (PHT): 4.89 cm    SHUNTS MV Decel Time: 155 msec    Systemic VTI:  0.12 m MV E velocity: 56.00 cm/s  Systemic Diam: 2.50  cm MV A velocity: 44.90 cm/s MV E/A ratio:  1.25 Eleonore Chiquito MD Electronically signed by Eleonore Chiquito MD Signature Date/Time: 08/24/2021/3:07:16 PM    Final     Cardiac Studies   Echo images personally reviewed  Patient Profile     40 y.o. 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 Now with decreased LV function. For now, needs diuresis and BP control.  Appreciate critical care management of resp failure/intubation.  At some point, needs cardiac MRI.  We will try to arrange when more stable.  For BP: Coreg, Hydralazine and received Lasix this AM. Some spikes in BP.  IV NTG stopped.   Elevated troponin likely more related to heart failure.  OK to stop IV heparin.  Will need SQ heparin for DVT prophylaxis.   Complex patient given his HIV as well as malignancy.       For questions or updates, please contact [place] Please consult [email or web address] for contact info under        Signed, Larae Grooms, MD  08/25/2021, 9:09 AM

## 2021-08-25 NOTE — Progress Notes (Signed)
Pt placed on PSV 5/5 per wean protocol. Pt is tolerating well at this time. RT to continue to monitor. 

## 2021-08-25 NOTE — Procedures (Signed)
Extubation Procedure Note  Patient Details:   Name: Richard Cox DOB: January 07, 1981 MRN: [id number]   Airway Documentation:    Vent end date: 08/25/21 Vent end time: 0916   Evaluation  O2 sats: stable throughout Complications: No apparent complications Patient did tolerate procedure well. Bilateral Breath Sounds: Inspiratory wheezes   Pt extubated to 3L [place] per MD order. Pt had positive cuff leak prior to extubation. No stridor noted. Pt able to voice his name.  [name] 08/25/2021, 9:16 AM

## 2021-08-25 NOTE — Progress Notes (Addendum)
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 Delton Coombes), substance abuse (alcohol and cocaine) who is admitted with acute hypoxic respiratory failure requiring intubation and mechanical ventilation. He presented to [facility] ED 10/16 for dyspnea BIBEMS. When EMS arrived to evaluate him, had O2 saturation in 60s, placed on BiPAP. On arrival to ED, confursed, pulling mask off, found to be febrile, with remarkable HTN. UDS with cocaine and he endorses use a couple days PTA. Trops 100-200. EKG with newish STEs in precordial leads. He was given full dose aspirin, started on heparin gtt, broad spectrum ABX, nitro gtt, diuresed, intubated and transferred to [facility] on 10/16.  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 Working dx: flash pulm edema, +/- aspiration. UDS with cocaine. BCx, UCx in process. Abx changed to zyvox and cefepime Last CD4 285 06/19/21 ; CXR with RLL > LLL/retrocardiac alveolar/interstitial opacities; TTE EF 45-50%, marked asymmetric hypertrophy but no LVOT obst, G2DD; EKG with STEs v2-v5, ST depressions inferior leads. IV heparin started.  10/17 extubated. Zyvox stopped. IV lasix X1, coreg and hydralazine resumed.   Interim History / Subjective:  Following commands and answering questions appropriately on fentanyl gtt. Does  not appear to be in distress.  Objective   Blood pressure 133/90, pulse 66, temperature 99 F (37.2 C), resp. rate (Abnormal) 23, height 6\' 1"  (1.854 m), weight 82.6 kg, SpO2 98 %.    Vent Mode: PRVC FiO2 (%):  [40 %-50 %] 40 % Set Rate:  [28 bmp] 28 bmp Vt Set:  [640 mL] 640 mL PEEP:  [5 cmH20] 5 cmH20 Plateau Pressure:  [16 cmH20-25 cmH20] 16 cmH20   Intake/Output Summary (Last 24 hours) at 08/25/2021 0825 Last data filed at 08/25/2021 0700 Gross per 24 hour  Intake 2012.54 ml  Output 2685 ml  Net -672.46 ml   Filed Weights   08/24/21 0151 08/24/21 2309 08/25/21 0335  Weight: 76 kg 82.6 kg 82.6 kg    Examination:  General: 40 year old male patient currently resting and on spontaneous breathing trial.  He is in no acute distress HEENT normocephalic atraumatic no jugular venous distention appreciated he is orally intubated Pulmonary: Coarse scattered rhonchi currently on minimal ventilator settings without accessory use pulse oximetry in the high 90s Cardiac: Regular rate and rhythm without murmur rub or gallop Abdomen soft not tender no organomegaly bowel sounds present GU clear yellow Neuro awake, moves all extremities, no focal deficits Extremities warm dry with no significant edema   Resolved Hospital Problem list   N/a  Assessment & Plan:  Acute systolic Heart failure w/ Hypertensive emergency and Abnormal EKG  w/  Troponinemia -seen by cards. Felt unlikely to be ACS -possibly exacerbated by cocaine  -some question of cardiac amyloid per cards note.  Plan Cont tele  Completing 48 hrs systemic heparin  Asa Resume antihypertensives (coreg and hydralazine for now) Additional recs per cards which  will include possible future imaging (eg: cardiac CT)   Acute Hypoxic respiratory failure 2/2 flash pulmonary edema +/- aspiration  Portable chest x-ray personally reviewed:Endotracheal tube in satisfactory position, bilateral airspace disease consistent with  edema Passed SBT Plan Extubate Titrate supplemental oxygen  Lasix x1 Day 3 abx; changed to cefepime and zyvox 10/16.  Can dc zyvox. PCR neg; prob complete 5d total abx   AKI -Renal function improving Plan Strict intake output Avoid hyper and hypotension Renally adjust medications as indicated A.m. chemistry  Possible UTI (based on pyuria from UA) Plan Cont cefepime   HIV Plan Continue Biktarvy Additional recommendations as indicated per infectious disease  B cell lymphoma s/p R-EPOCH 08/2020-12/27/20 Plan F/u out pt     Best Practice (right click and "Reselect all SmartList Selections" daily)   Diet/type: NPO w/ meds via tube DVT prophylaxis: systemic heparin GI prophylaxis: H2B Lines: yes and it is still needed - he has a port Foley:  N/A Code Status:  full code Last date of multidisciplinary goals of care discussion [to be updated today]   Critical care time: 32 min      Erick Colace ACNP-BC [place] Pager # [phone] OR # [phone] if no answer   Attending attestation: [name] is a 40 y/o gentleman with a history of HIV, B-cell lymphoma, HFrEF with severe LVH who presented with acute SOB that was persistent before coming to the ED. He had been off his cardiac meds and not following his fluid restriction PTA. He was intubated for respiratory failure in the ED at [facility] and was transferred to [facility] for management. He aspirated TF overnight. He denies complaints today. Passed SBT & was able to be extubated this morning.  BP (!) 147/90   Pulse 81   Temp 99.1 F (37.3 C)   Resp 19   Ht 6\' 1"  (1.854 m)   Wt 82.6 kg   SpO2 97%   BMI 24.03 kg/m  Chronically ill appearing man lying in bed in NAD [place]/AT, eyes anicteric Breathing comfortably on [place], wheezing bilaterally. No accessory muscle use. S1S2, RRR Abd soft, NT No LE edema, no cyanosis No rashes or ecchymoses Awake and alert, answering questions appropriately, moving extremities.    Blood cx NGTD BUN 25 Cr 1.35 WBC 4.2 H/H 10/30  Assessment & plan: Acute respiratory failure with hypoxia due to flash pulmonary edema -SAT & SBT- passed, extubated to [place] -additional lasix today -duonebs PRN, adding daily Anoro -optimize BP control  Acute decompensated HFpEF, LVH -appreciate cardiology's input -adding coreg, hydralazine, lasix-- encouraged compliance after discharge -tele monitoring  HIV -ART  Chronic anemia -transfuse for Hb<7 or hemodynamically significant bleeding  Concern for aspiration -con't cefepime, which was started empirically for possible sepsis.  This patient is critically ill with multiple organ system failure which requires frequent high complexity decision making, assessment, support, evaluation, and titration of therapies. This was completed through the application of advanced monitoring technologies and extensive interpretation of multiple databases. During this encounter critical care time was devoted to patient care services described in this note for 31 minutes.   Julian Hy, DO 08/25/21 1:28 PM [facility]

## 2021-08-25 NOTE — Progress Notes (Signed)
[place] for Heparin Indication: chest pain/ACS  Allergies  Allergen Reactions   Neosporin [Neomycin-Bacitracin Zn-Polymyx] Rash    Patient Measurements: Height: 6\' 1"  (185.4 cm) Weight: 82.6 kg (182 lb 1.6 oz) IBW/kg (Calculated) : 79.9 HEPARIN DW (KG): 76   Vital Signs: Temp: 99.3 F (37.4 C) (10/17 0400) Temp Source: Core (10/17 0000) BP: 122/95 (10/17 0400) Pulse Rate: 78 (10/17 0400)  Labs: Recent Labs    08/24/21 0155 08/24/21 0351 08/24/21 0458 08/24/21 0710 08/24/21 0946 08/24/21 1143 08/24/21 1855 08/25/21 0336 08/25/21 0410  HGB 13.3  --  11.1*  --   --   --   --  10.0* 10.5*  HCT 41.8  --  33.7*  --   --   --   --  30.0* 31.0*  PLT 219  --  122*  --   --   --   --  PENDING  --   HEPARINUNFRC  --   --   --   --   --   --  <0.10* <0.10*  --   CREATININE 1.32*  --  1.50*  --   --   --   --  1.35*  --   CKTOTAL  --   --   --   --   --   --  151  --   --   TROPONINIHS 96*   < >  --  177* 169* 177*  --   --   --    < > = values in this interval not displayed.     Estimated Creatinine Clearance: 83.8 mL/min (A) (by C-G formula based on SCr of 1.35 mg/dL (H)).   Medical History: Past Medical History:  Diagnosis Date   CHF (congestive heart failure) ([place])    Essential hypertension    HIV infection ([place])    Noncompliance with medication regimen    Secondary cardiomyopathy ([place])     Assessment: Patient with elevated troponins, and concern for ACS. UDS was positive for cocaine.  Patient not on oral anticoagulants PTA. Pharmacy asked to anticoagulate with heparin.  Heparin level remains undetectable on infusion at 1250 units/hr. No issues with line or bleeding reported per RN.  Goal of Therapy:  Heparin level 0.3-0.7 units/ml Monitor platelets by anticoagulation protocol: Yes   Plan:  Rebolus heparin 2500 units x 1 Increase heparin infusion to 1550 units/hr F/u 6 hr heparin level  Sherlon Handing, PharmD,  BCPS Please see amion for complete clinical pharmacist phone list 08/25/2021 4:21 AM

## 2021-08-25 NOTE — Progress Notes (Signed)
Nutrition Brief Note  Dietitian received consult for TF over the weekend; protocol ordered but pt subsequently extubated today prior to assessment.   Wt Readings from Last 15 Encounters:  08/25/21 82.6 kg  07/01/21 75.7 kg  06/19/21 78 kg  03/25/21 78.9 kg  02/25/21 80.7 kg  01/27/21 78.9 kg  01/14/21 77.2 kg  12/23/20 75.4 kg  12/02/20 77.2 kg  11/11/20 77.4 kg  11/04/20 73.7 kg  09/09/20 71.3 kg  09/02/20 71.6 kg  08/26/20 70.3 kg  08/21/20 69.8 kg   No weight loss noted per weight encounters; in fact, pt appears to have gained weight  Body mass index is 24.03 kg/m.   Upon rounds today, diet advanced to CL and pt had already eaten 2 italian ice. Diet now advanced to Heart Healthy, good appetite. Labs and medications reviewed.   No nutrition interventions warranted at this time. If nutrition issues arise, please consult RD.   Kerman Passey MS, RDN, LDN, CNSC Registered Dietitian III Clinical Nutrition RD Pager and On-Call Pager Number Located in [place]

## 2021-08-26 ENCOUNTER — Inpatient Hospital Stay (HOSPITAL_COMMUNITY): Payer: 59

## 2021-08-26 ENCOUNTER — Inpatient Hospital Stay (HOSPITAL_COMMUNITY): Payer: 59 | Attending: Hematology

## 2021-08-26 DIAGNOSIS — I5021 Acute systolic (congestive) heart failure: Secondary | ICD-10-CM

## 2021-08-26 DIAGNOSIS — J9601 Acute respiratory failure with hypoxia: Secondary | ICD-10-CM | POA: Diagnosis not present

## 2021-08-26 DIAGNOSIS — I10 Essential (primary) hypertension: Secondary | ICD-10-CM | POA: Diagnosis not present

## 2021-08-26 DIAGNOSIS — I5043 Acute on chronic combined systolic (congestive) and diastolic (congestive) heart failure: Secondary | ICD-10-CM

## 2021-08-26 DIAGNOSIS — B20 Human immunodeficiency virus [HIV] disease: Secondary | ICD-10-CM | POA: Diagnosis not present

## 2021-08-26 LAB — CBC
HCT: 34.7 % — ABNORMAL LOW (ref 39.0–52.0)
Hemoglobin: 11.4 g/dL — ABNORMAL LOW (ref 13.0–17.0)
MCH: 29.5 pg (ref 26.0–34.0)
MCHC: 32.9 g/dL (ref 30.0–36.0)
MCV: 89.9 fL (ref 80.0–100.0)
Platelets: 134 10*3/uL — ABNORMAL LOW (ref 150–400)
RBC: 3.86 MIL/uL — ABNORMAL LOW (ref 4.22–5.81)
RDW: 15.1 % (ref 11.5–15.5)
WBC: 4.1 10*3/uL (ref 4.0–10.5)
nRBC: 0 % (ref 0.0–0.2)

## 2021-08-26 LAB — COMPREHENSIVE METABOLIC PANEL
ALT: 15 U/L (ref 0–44)
AST: 22 U/L (ref 15–41)
Albumin: 3.3 g/dL — ABNORMAL LOW (ref 3.5–5.0)
Alkaline Phosphatase: 79 U/L (ref 38–126)
Anion gap: 9 (ref 5–15)
BUN: 26 mg/dL — ABNORMAL HIGH (ref 6–20)
CO2: 25 mmol/L (ref 22–32)
Calcium: 8.7 mg/dL — ABNORMAL LOW (ref 8.9–10.3)
Chloride: 101 mmol/L (ref 98–111)
Creatinine, Ser: 1.19 mg/dL (ref 0.61–1.24)
GFR, Estimated: >60 mL/min (ref >60)
Glucose, Bld: 104 mg/dL — ABNORMAL HIGH (ref 70–99)
Potassium: 3.6 mmol/L (ref 3.5–5.1)
Sodium: 135 mmol/L (ref 135–145)
Total Bilirubin: 0.5 mg/dL (ref 0.3–1.2)
Total Protein: 6.9 g/dL (ref 6.5–8.1)

## 2021-08-26 LAB — PHOSPHORUS: Phosphorus: 3.3 mg/dL (ref 2.5–4.6)

## 2021-08-26 LAB — MAGNESIUM: Magnesium: 2.3 mg/dL (ref 1.7–2.4)

## 2021-08-26 MED ORDER — PHENOL 1.4 % MT LIQD
1.0000 | OROMUCOSAL | Status: DC | PRN
  Filled 2021-08-26: qty 177

## 2021-08-26 MED ORDER — CARVEDILOL 12.5 MG PO TABS
12.5000 mg | ORAL_TABLET | Freq: Two times a day (BID) | ORAL | Status: DC
  Administered 2021-08-27: 12.5 mg via ORAL
  Filled 2021-08-26 (×2): qty 1

## 2021-08-26 MED ORDER — GADOBUTROL 1 MMOL/ML IV SOLN
8.0000 mL | Freq: Once | INTRAVENOUS | Status: AC | PRN
  Administered 2021-08-26: 8 mL via INTRAVENOUS

## 2021-08-26 MED FILL — Midazolam HCl Inj 5 MG/5ML (Base Equivalent): INTRAMUSCULAR | Qty: 5 | Status: AC

## 2021-08-26 NOTE — Progress Notes (Signed)
PROGRESS NOTE    Richard Cox  WNU:[id number] DOB: [birth year] DOA: 08/24/2021 PCP: Lindell Spar, MD    Brief Narrative:  40 year old with heart failure with reduced ejection fraction, infiltrative cardiomyopathy, HIV, B-cell lymphoma treated with chemotherapy, substance abuse alcohol and cocaine admitted with acute hypoxemic respiratory failure requiring intubation mechanical ventilation.  Initially placed on BiPAP from home, confused pulling off mask, febrile with remarkable elevated blood pressures needing mechanical ventilation.  UDS positive for cocaine.  Mild elevated troponins.  EKG with ST elevations in precordial leads.  10/16, intubated cardiology consulted.  Transfer to [facility].  Treated for flash pulmonary edema along with aspiration pneumonia.  Treated with Zyvox and cefepime.  Last CD4 count 285. 10/17 extubated, Zyvox stopped.  Diuresed.  Blood pressure medications resumed. 10/18, transferred to [facility].   Assessment & Plan:   Active Problems:   CHF exacerbation (HCC)   HTN (hypertension)   Substance abuse ([place])   AIDS (acquired immune deficiency syndrome) ([place])   Acute respiratory failure with hypoxia (HCC)   Sepsis (HCC)   Elevated troponin   Acute lower UTI   Hypokalemia   Endotracheally intubated   Flash pulmonary edema (HCC)  Acute systolic congestive heart failure Hypertensive emergency and abnormal EKG with elevated troponins Acute hypoxemic respiratory failure secondary to flash pulmonary edema and also suspected aspiration pneumonia.  - Echocardiogram with Ejection fraction 45 to 50%.  Grade 2 diastolic dysfunction.  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  MRI cardiac once more stable to go to MRI.  He has history of lymphoma.  Currently being diuresed by IV Lasix and maintaining good diuresis.  Started on carvedilol and hydralazine.  Followed by cardiology.  On aspirin.  Completed 48 hours of heparin  infusion.  Less likely acute coronary syndrome.  Further management as per cardiology.  More than 5 L urine output last 24 hours. -Extubated to room air.  On 1 to 2 L of oxygen.  Mobilize.  Respiratory therapy. -Cultures negative.  Suspected aspiration.  Remains on cefepime.  Will complete 5 days of IV antibiotic therapy.  Respiratory therapy as above. -Sepsis present on admission and improving.  UTI suspected on admission, ruled out.  History of HIV: Last CD4 count reported 285.  On Biktarvy.  Continue.  B-cell lymphoma status post chemotherapy: Followed by oncology outpatient.  Acute kidney injury: Improving.  Normalized.  Cocaine use: Probably aggravating his cardiovascular issues.  Counseled to quit.  Mobilize.  Transfer to cardiac telemetry.  Cardiac MRI today.     DVT prophylaxis: heparin injection 5,000 Units Start: 08/25/21 1400 SCDs Start: 08/24/21 0347   Code Status: Full code Family Communication: None Disposition Plan: Status is: Inpatient  Remains inpatient appropriate because: IV diuresis.  Cardiac work-up.       Consultants:  Cardiology PCCM  Procedures:  Cardiac MRI.  Pending  Antimicrobials:  Zyvox and cefepime 10/16--- Cefepime 10/16----   Subjective: Patient seen and examined.  He was in ICU.  He was on 2 L oxygen.  Denies any complaints today.  Has not walked in the hallway yet but denies any shortness of breath or chest pain.  Objective: Vitals:   08/26/21 1006 08/26/21 1019 08/26/21 1031 08/26/21 1234  BP: (!) 139/102 (!) 138/92 (!) 133/92 134/78  Pulse: 77 72 68 78  Resp:    19  Temp:      TempSrc:      SpO2: 97% 96% 97% 96%  Weight:  Height:        Intake/Output Summary (Last 24 hours) at 08/26/2021 1243 Last data filed at 08/26/2021 1200 Gross per 24 hour  Intake 1058.08 ml  Output 5330 ml  Net -4271.92 ml   Filed Weights   08/24/21 0151 08/24/21 2309 08/25/21 0335  Weight: 76 kg 82.6 kg 82.6 kg     Examination:  General: Looks comfortable.  Fairly stable on 2 L oxygen. Cardiovascular: S1-S2 normal.  Regular rate rhythm. Respiratory: Bilateral clear.  No added sounds. Gastrointestinal: Soft and nontender.  Bowel sounds present. Ext: No swelling or edema.  No cyanosis. Neuro: Alert oriented x4.  No focal deficits. Musculoskeletal: No deformities.    Data Reviewed: I have personally reviewed following labs and imaging studies  CBC: Recent Labs  Lab 08/24/21 0155 08/24/21 0458 08/25/21 0336 08/25/21 0410 08/26/21 0112  WBC 16.6* 5.8 4.2  --  4.1  NEUTROABS 11.1*  --   --   --   --   HGB 13.3 11.1* 10.0* 10.5* 11.4*  HCT 41.8 33.7* 30.0* 31.0* 34.7*  MCV 94.6 92.6 89.6  --  89.9  PLT 219 122* 104*  --  062*   Basic Metabolic Panel: Recent Labs  Lab 08/24/21 0155 08/24/21 0458 08/24/21 1855 08/25/21 0336 08/25/21 0410 08/26/21 0112  NA 136 138  --  137 138 135  K 3.4* 4.2  --  3.8 3.8 3.6  CL 103 107  --  106  --  101  CO2 24 25  --  22  --  25  GLUCOSE 249* 83  --  94  --  104*  BUN 19 22*  --  25*  --  26*  CREATININE 1.32* 1.50*  --  1.35*  --  1.19  CALCIUM 8.4* 8.0*  --  8.5*  --  8.7*  MG  --  1.9 2.0 2.0  --  2.3  PHOS  --   --  4.4 3.2  --  3.3   GFR: Estimated Creatinine Clearance: 95.1 mL/min (by C-G formula based on SCr of 1.19 mg/dL). Liver Function Tests: Recent Labs  Lab 08/24/21 0155 08/24/21 0458 08/25/21 0336 08/26/21 0112  AST 34 30 18 22   ALT 22 19 16 15   ALKPHOS 96 77 72 79  BILITOT 0.7 0.6 0.8 0.5  PROT 7.6 6.3* 5.9* 6.9  ALBUMIN 3.9 3.4* 2.9* 3.3*   No results for input(s): LIPASE, AMYLASE in the last 168 hours. No results for input(s): AMMONIA in the last 168 hours. Coagulation Profile: No results for input(s): INR, PROTIME in the last 168 hours. Cardiac Enzymes: Recent Labs  Lab 08/24/21 1855  CKTOTAL 151   BNP (last 3 results) No results for input(s): PROBNP in the last 8760 hours. HbA1C: Recent Labs     08/25/21 0336  HGBA1C 5.1   CBG: Recent Labs  Lab 08/25/21 0744 08/25/21 1105 08/25/21 1528 08/25/21 1946 08/25/21 2333  GLUCAP 90 119* 97 105* 127*   Lipid Profile: Recent Labs    08/25/21 0336  CHOL 183  HDL 25*  LDLCALC 134*  TRIG 121  120  CHOLHDL 7.3   Thyroid Function Tests: Recent Labs    08/24/21 0946  TSH 0.786   Anemia Panel: No results for input(s): VITAMINB12, FOLATE, FERRITIN, TIBC, IRON, RETICCTPCT in the last 72 hours. Sepsis Labs: Recent Labs  Lab 08/24/21 0155 08/24/21 0458 08/24/21 0710 08/24/21 0946 08/24/21 1143  PROCALCITON  --   --   --   --  6.97  LATICACIDVEN 3.8* 0.8 0.8 0.7  --     Recent Results (from the past 240 hour(s))  Resp Panel by RT-PCR (Flu A&B, Covid) Nasopharyngeal Swab     Status: None   Collection Time: 08/24/21  2:15 AM   Specimen: Nasopharyngeal Swab; Nasopharyngeal(NP) swabs in vial transport medium  Result Value Ref Range Status   SARS Coronavirus 2 by RT PCR NEGATIVE NEGATIVE Final    Comment: (NOTE) SARS-CoV-2 target nucleic acids are NOT DETECTED.  The SARS-CoV-2 RNA is generally detectable in upper respiratory specimens during the acute phase of infection. The lowest concentration of SARS-CoV-2 viral copies this assay can detect is 138 copies/mL. A negative result does not preclude SARS-Cov-2 infection and should not be used as the sole basis for treatment or other patient management decisions. A negative result may occur with  improper specimen collection/handling, submission of specimen other than nasopharyngeal swab, presence of viral mutation(s) within the areas targeted by this assay, and inadequate number of viral copies(<138 copies/mL). A negative result must be combined with clinical observations, patient history, and epidemiological information. The expected result is Negative.  Fact Sheet for Patients:  EntrepreneurPulse.com.au  Fact Sheet for Healthcare Providers:   IncredibleEmployment.be  This test is no t yet approved or cleared by the [place] FDA and  has been authorized for detection and/or diagnosis of SARS-CoV-2 by FDA under an Emergency Use Authorization (EUA). This EUA will remain  in effect (meaning this test can be used) for the duration of the COVID-19 declaration under Section 564(b)(1) of the Act, 21 U.S.C.section 360bbb-3(b)(1), unless the authorization is terminated  or revoked sooner.       Influenza A by PCR NEGATIVE NEGATIVE Final   Influenza B by PCR NEGATIVE NEGATIVE Final    Comment: (NOTE) The Xpert Xpress SARS-CoV-2/FLU/RSV plus assay is intended as an aid in the diagnosis of influenza from Nasopharyngeal swab specimens and should not be used as a sole basis for treatment. Nasal washings and aspirates are unacceptable for Xpert Xpress SARS-CoV-2/FLU/RSV testing.  Fact Sheet for Patients: EntrepreneurPulse.com.au  Fact Sheet for Healthcare Providers: IncredibleEmployment.be  This test is not yet approved or cleared by the [place] FDA and has been authorized for detection and/or diagnosis of SARS-CoV-2 by FDA under an Emergency Use Authorization (EUA). This EUA will remain in effect (meaning this test can be used) for the duration of the COVID-19 declaration under Section 564(b)(1) of the Act, 21 U.S.C. section 360bbb-3(b)(1), unless the authorization is terminated or revoked.  Performed at [facility], [address], [place], [place] [address]   Blood culture (routine x 2)     Status: None (Preliminary result)   Collection Time: 08/24/21  3:03 AM   Specimen: BLOOD LEFT HAND  Result Value Ref Range Status   Specimen Description BLOOD LEFT HAND  Final   Special Requests   Final    BOTTLES DRAWN AEROBIC AND ANAEROBIC Blood Culture adequate volume   Culture   Final    NO GROWTH 2 DAYS Performed at [facility], [address], [place], [place]  [address]    Report Status PENDING  Incomplete  Blood culture (routine x 2)     Status: None (Preliminary result)   Collection Time: 08/24/21  3:08 AM   Specimen: BLOOD LEFT FOREARM  Result Value Ref Range Status   Specimen Description BLOOD LEFT FOREARM  Final   Special Requests   Final    BOTTLES DRAWN AEROBIC AND ANAEROBIC Blood Culture results may not  be optimal due to an excessive volume of blood received in culture bottles   Culture   Final    NO GROWTH 2 DAYS Performed at [facility], [address] [place], [place] [address]    Report Status PENDING  Incomplete  Urine Culture     Status: None   Collection Time: 08/24/21  4:10 AM   Specimen: Urine, Clean Catch  Result Value Ref Range Status   Specimen Description   Final    URINE, CLEAN CATCH Performed at [facility], [address] [place], [place] [address]    Special Requests   Final    NONE Performed at [facility], [address], [place], [place] [address]    Culture   Final    NO GROWTH Performed at [facility], [place] [address] [place], [place] [address]    Report Status 08/25/2021 FINAL  Final  MRSA Next Gen by PCR, Nasal     Status: None   Collection Time: 08/24/21  1:10 PM   Specimen: Nasal Mucosa; Nasal Swab  Result Value Ref Range Status   MRSA by PCR Next Gen NOT DETECTED NOT DETECTED Final    Comment: (NOTE) The GeneXpert MRSA Assay (FDA approved for NASAL specimens only), is one component of a comprehensive MRSA colonization surveillance program. It is not intended to diagnose MRSA infection nor to guide or monitor treatment for MRSA infections. Test performance is not FDA approved in patients less than 1 years old. Performed at [facility], [place] [address], [place], [place] [address]   Culture, Respiratory w Gram Stain     Status: None (Preliminary result)   Collection Time: 08/24/21  9:30 PM   Specimen: Tracheal Aspirate; Respiratory  Result Value Ref Range Status   Specimen  Description TRACHEAL ASPIRATE  Final   Special Requests NONE  Final   Gram Stain   Final    FEW WBC PRESENT, PREDOMINANTLY MONONUCLEAR RARE GRAM POSITIVE COCCI IN PAIRS    Culture   Final    CULTURE REINCUBATED FOR BETTER GROWTH Performed at [facility], [place] [address] [place], [place] [address]    Report Status PENDING  Incomplete         Radiology Studies: DG Chest 1 View  Result Date: 08/24/2021 CLINICAL DATA:  Confirm existing ETT placement. EXAM: CHEST  1 VIEW COMPARISON:  Chest radiograph 08/24/2021 FINDINGS: The endotracheal tube tip terminates between the thoracic inlet and carina. A nasogastric tube remains in place. Unchanged position of a right chest Port-A-Cath. Improved aeration of the bilateral lungs. There is a persistent opacity in the right base. No pneumothorax. No large pleural effusion. No acute finding in the visualized skeleton. IMPRESSION: 1. Endotracheal tube tip terminates between the thoracic inlet and carina. 2. Improved aeration of the bilateral lungs with persistent right basilar opacity. Electronically Signed   By: Audie Pinto M.D.   On: 08/24/2021 16:53   DG Abd 1 View  Result Date: 08/24/2021 CLINICAL DATA:  Recent vomiting EXAM: ABDOMEN - 1 VIEW COMPARISON:  None. FINDINGS: Gastric catheter is noted within the stomach. Scattered bowel gas is noted without obstructive pattern. No bony abnormality is seen. IMPRESSION: No acute abnormality noted. Electronically Signed   By: Inez Catalina M.D.   On: 08/24/2021 23:38   DG CHEST PORT 1 VIEW  Result Date: 08/24/2021 CLINICAL DATA:  Recent vomiting EXAM: PORTABLE CHEST 1 VIEW COMPARISON:  08/24/2021 FINDINGS: Cardiac shadow is stable. Endotracheal tube and gastric catheter  are noted in satisfactory position. Right-sided chest wall port is noted in satisfactory position. Lungs are well aerated bilaterally with some persistent right basilar opacity stable from the previous exam. No bony abnormality  is seen. IMPRESSION: Stable right basilar airspace opacity. Tubes and lines unchanged from the prior study. Electronically Signed   By: Inez Catalina M.D.   On: 08/24/2021 23:32   DG Abd Portable 1V  Result Date: 08/24/2021 CLINICAL DATA:  OG tube placement EXAM: PORTABLE ABDOMEN - 1 VIEW COMPARISON:  08/24/2021 FINDINGS: OG tube is in the stomach, unchanged. IMPRESSION: OG tube in the stomach. Electronically Signed   By: Rolm Baptise M.D.   On: 08/24/2021 15:47        Scheduled Meds:  aspirin  81 mg Oral Daily   bictegravir-emtricitabine-tenofovir AF  1 tablet Oral Daily   carvedilol  6.25 mg Oral BID WC   Chlorhexidine Gluconate Cloth  6 each Topical Daily   feeding supplement  1 Container Oral TID BM   furosemide  40 mg Intravenous BID   heparin injection (subcutaneous)  5,000 Units Subcutaneous Q8H   hydrALAZINE  50 mg Oral Q8H   mouth rinse  15 mL Mouth Rinse BID   polyethylene glycol  17 g Oral Daily   umeclidinium-vilanterol  1 puff Inhalation Daily   Continuous Infusions:  ceFEPime (MAXIPIME) IV 2 g (08/26/21 0526)     LOS: 2 days    Time spent: 35 minutes    Barb Merino, MD [facility] Pager [phone]

## 2021-08-26 NOTE — Plan of Care (Signed)

## 2021-08-26 NOTE — Progress Notes (Addendum)
Progress Note  Patient Name: Richard Cox Date of Encounter: 08/26/2021  [facility] Cardiologist: Rozann Lesches, MD   Subjective   Feels better.  Sore throat after intubation  Inpatient Medications    Scheduled Meds:  aspirin  81 mg Oral Daily   bictegravir-emtricitabine-tenofovir AF  1 tablet Oral Daily   [START ON 08/27/2021] carvedilol  12.5 mg Oral BID WC   Chlorhexidine Gluconate Cloth  6 each Topical Daily   feeding supplement  1 Container Oral TID BM   heparin injection (subcutaneous)  5,000 Units Subcutaneous Q8H   hydrALAZINE  50 mg Oral Q8H   mouth rinse  15 mL Mouth Rinse BID   polyethylene glycol  17 g Oral Daily   umeclidinium-vilanterol  1 puff Inhalation Daily   Continuous Infusions:  ceFEPime (MAXIPIME) IV 2 g (08/26/21 2058)   PRN Meds: acetaminophen **OR** acetaminophen, albuterol, [DISCONTINUED] ondansetron **OR** ondansetron (ZOFRAN) IV, phenol   Vital Signs    Vitals:   08/26/21 1847 08/26/21 1900 08/26/21 1949 08/26/21 2000  BP: (!) 158/96 (!) 143/98  (!) 129/93  Pulse:  78  75  Resp:  20 17 20   Temp:  98.1 F (36.7 C)    TempSrc:  Oral    SpO2:  92%    Weight:      Height:        Intake/Output Summary (Last 24 hours) at 08/26/2021 2225 Last data filed at 08/26/2021 2058 Gross per 24 hour  Intake 1114.46 ml  Output 3455 ml  Net -2340.54 ml   Last 3 Weights 08/25/2021 08/24/2021 08/24/2021  Weight (lbs) 182 lb 1.6 oz 182 lb 1.6 oz 167 lb 8.8 oz  Weight (kg) 82.6 kg 82.6 kg 76 kg      Telemetry    NSR - Personally Reviewed  ECG      Physical Exam   GEN: No acute distress.   Neck: No JVD Cardiac: RRR, no murmurs, rubs, or gallops.  Respiratory: Clear to auscultation bilaterally. GI: Soft, nontender, non-distended  MS: No edema; No deformity. Neuro:  Nonfocal  Psych: Normal affect   Labs    High Sensitivity Troponin:   Recent Labs  Lab 08/24/21 0155 08/24/21 0351 08/24/21 0710 08/24/21 0946 08/24/21 1143   TROPONINIHS 96* 155* 177* 169* 177*     Chemistry Recent Labs  Lab 08/24/21 0458 08/24/21 1855 08/25/21 0336 08/25/21 0410 08/26/21 0112  NA 138  --  137 138 135  K 4.2  --  3.8 3.8 3.6  CL 107  --  106  --  101  CO2 25  --  22  --  25  GLUCOSE 83  --  94  --  104*  BUN 22*  --  25*  --  26*  CREATININE 1.50*  --  1.35*  --  1.19  CALCIUM 8.0*  --  8.5*  --  8.7*  MG 1.9 2.0 2.0  --  2.3  PROT 6.3*  --  5.9*  --  6.9  ALBUMIN 3.4*  --  2.9*  --  3.3*  AST 30  --  18  --  22  ALT 19  --  16  --  15  ALKPHOS 77  --  72  --  79  BILITOT 0.6  --  0.8  --  0.5  GFRNONAA >60  --  >60  --  >60  ANIONGAP 6  --  9  --  9    Lipids  Recent Labs  Lab 08/25/21 0336  CHOL 183  TRIG 121  120  HDL 25*  LDLCALC 134*  CHOLHDL 7.3    Hematology Recent Labs  Lab 08/24/21 0458 08/25/21 0336 08/25/21 0410 08/26/21 0112  WBC 5.8 4.2  --  4.1  RBC 3.64* 3.35*  --  3.86*  HGB 11.1* 10.0* 10.5* 11.4*  HCT 33.7* 30.0* 31.0* 34.7*  MCV 92.6 89.6  --  89.9  MCH 30.5 29.9  --  29.5  MCHC 32.9 33.3  --  32.9  RDW 15.5 15.6*  --  15.1  PLT 122* 104*  --  134*   Thyroid  Recent Labs  Lab 08/24/21 0946  TSH 0.786    BNP Recent Labs  Lab 08/24/21 0155  BNP 1,714.0*    DDimer No results for input(s): DDIMER in the last 168 hours.   Radiology    DG Abd 1 View  Result Date: 08/24/2021 CLINICAL DATA:  Recent vomiting EXAM: ABDOMEN - 1 VIEW COMPARISON:  None. FINDINGS: Gastric catheter is noted within the stomach. Scattered bowel gas is noted without obstructive pattern. No bony abnormality is seen. IMPRESSION: No acute abnormality noted. Electronically Signed   By: Inez Catalina M.D.   On: 08/24/2021 23:38   DG CHEST PORT 1 VIEW  Result Date: 08/24/2021 CLINICAL DATA:  Recent vomiting EXAM: PORTABLE CHEST 1 VIEW COMPARISON:  08/24/2021 FINDINGS: Cardiac shadow is stable. Endotracheal tube and gastric catheter are noted in satisfactory position. Right-sided chest wall port  is noted in satisfactory position. Lungs are well aerated bilaterally with some persistent right basilar opacity stable from the previous exam. No bony abnormality is seen. IMPRESSION: Stable right basilar airspace opacity. Tubes and lines unchanged from the prior study. Electronically Signed   By: Inez Catalina M.D.   On: 08/24/2021 23:32    Cardiac Studies   Cardiac MRI pending  Patient Profile     40 y.o. male noncompliance, substance abuse who had pulm edema, resp failure and poorly controlled HTN, acute systolic heart failure  Assessment & Plan    HTN:  INcreased coreg today to 12.5 mg BID.  Stressed importance of BP control to prevent future pulm edema/ hypoxic resp failure  Cardiac MRI pending to eval for amyloid in setting of impressive LVH.  THis was planned months ago but did not happen.    HIV/Malignancy- managed by IM.      For questions or updates, please contact [place] Please consult [email or web address] for contact info under        Signed, Larae Grooms, MD  08/26/2021, 10:25 PM

## 2021-08-26 NOTE — Progress Notes (Signed)
cMR complete without incident, VS remained stable throughout, pt back to 2H with transport and cardiac imaging RN

## 2021-08-26 NOTE — Progress Notes (Signed)
Patient to MR with transport and cardiac imaging RN

## 2021-08-27 ENCOUNTER — Other Ambulatory Visit (HOSPITAL_COMMUNITY): Payer: Self-pay

## 2021-08-27 ENCOUNTER — Encounter (HOSPITAL_COMMUNITY): Payer: Self-pay | Admitting: Hematology

## 2021-08-27 DIAGNOSIS — I5023 Acute on chronic systolic (congestive) heart failure: Secondary | ICD-10-CM

## 2021-08-27 DIAGNOSIS — R509 Fever, unspecified: Secondary | ICD-10-CM

## 2021-08-27 DIAGNOSIS — I10 Essential (primary) hypertension: Secondary | ICD-10-CM | POA: Diagnosis not present

## 2021-08-27 DIAGNOSIS — R778 Other specified abnormalities of plasma proteins: Secondary | ICD-10-CM | POA: Diagnosis not present

## 2021-08-27 DIAGNOSIS — F1910 Other psychoactive substance abuse, uncomplicated: Secondary | ICD-10-CM

## 2021-08-27 LAB — CBC
HCT: 37.3 % — ABNORMAL LOW (ref 39.0–52.0)
Hemoglobin: 12.2 g/dL — ABNORMAL LOW (ref 13.0–17.0)
MCH: 29.3 pg (ref 26.0–34.0)
MCHC: 32.7 g/dL (ref 30.0–36.0)
MCV: 89.4 fL (ref 80.0–100.0)
Platelets: 160 10*3/uL (ref 150–400)
RBC: 4.17 MIL/uL — ABNORMAL LOW (ref 4.22–5.81)
RDW: 14.7 % (ref 11.5–15.5)
WBC: 4.5 10*3/uL (ref 4.0–10.5)
nRBC: 0 % (ref 0.0–0.2)

## 2021-08-27 LAB — COMPREHENSIVE METABOLIC PANEL
ALT: 20 U/L (ref 0–44)
AST: 22 U/L (ref 15–41)
Albumin: 3.4 g/dL — ABNORMAL LOW (ref 3.5–5.0)
Alkaline Phosphatase: 82 U/L (ref 38–126)
Anion gap: 13 (ref 5–15)
BUN: 29 mg/dL — ABNORMAL HIGH (ref 6–20)
CO2: 23 mmol/L (ref 22–32)
Calcium: 9.5 mg/dL (ref 8.9–10.3)
Chloride: 99 mmol/L (ref 98–111)
Creatinine, Ser: 1.26 mg/dL — ABNORMAL HIGH (ref 0.61–1.24)
GFR, Estimated: >60 mL/min (ref >60)
Glucose, Bld: 96 mg/dL (ref 70–99)
Potassium: 3.8 mmol/L (ref 3.5–5.1)
Sodium: 135 mmol/L (ref 135–145)
Total Bilirubin: 0.3 mg/dL (ref 0.3–1.2)
Total Protein: 7.7 g/dL (ref 6.5–8.1)

## 2021-08-27 LAB — CULTURE, RESPIRATORY W GRAM STAIN: Culture: NORMAL

## 2021-08-27 LAB — GLUCOSE, CAPILLARY: Glucose-Capillary: 90 mg/dL (ref 70–99)

## 2021-08-27 LAB — MAGNESIUM: Magnesium: 2 mg/dL (ref 1.7–2.4)

## 2021-08-27 LAB — PHOSPHORUS: Phosphorus: 3.7 mg/dL (ref 2.5–4.6)

## 2021-08-27 MED ORDER — FUROSEMIDE 40 MG PO TABS: 40.0000 mg | ORAL_TABLET | Freq: Every day | ORAL | 3 refills | Status: DC | PRN

## 2021-08-27 MED ORDER — AMOXICILLIN-POT CLAVULANATE 875-125 MG PO TABS
1.0000 | ORAL_TABLET | Freq: Two times a day (BID) | ORAL | 0 refills | Status: DC
  Filled 2021-08-27: qty 6, 3d supply, fill #0

## 2021-08-27 MED ORDER — AMOXICILLIN-POT CLAVULANATE 875-125 MG PO TABS
1.0000 | ORAL_TABLET | Freq: Two times a day (BID) | ORAL | Status: DC
  Administered 2021-08-27: 1 via ORAL
  Filled 2021-08-27: qty 1

## 2021-08-27 MED ORDER — ASPIRIN 81 MG PO CHEW: 81.0000 mg | CHEWABLE_TABLET | Freq: Every day | ORAL | 0 refills | Status: DC

## 2021-08-27 MED ORDER — HEPARIN SOD (PORK) LOCK FLUSH 100 UNIT/ML IV SOLN
500.0000 [IU] | INTRAVENOUS | Status: AC | PRN
  Administered 2021-08-27: 500 [IU]
  Filled 2021-08-27: qty 5

## 2021-08-27 MED ORDER — SODIUM CHLORIDE 0.9% FLUSH
10.0000 mL | INTRAVENOUS | Status: DC | PRN
  Administered 2021-08-27: 10 mL

## 2021-08-27 MED ORDER — LOSARTAN POTASSIUM 50 MG PO TABS
50.0000 mg | ORAL_TABLET | Freq: Every day | ORAL | Status: DC
  Administered 2021-08-27: 50 mg via ORAL
  Filled 2021-08-27: qty 1

## 2021-08-27 MED ORDER — LOSARTAN POTASSIUM 50 MG PO TABS
50.0000 mg | ORAL_TABLET | Freq: Every day | ORAL | 0 refills | Status: DC
  Filled 2021-08-27: qty 30, 30d supply, fill #0

## 2021-08-27 MED ORDER — POTASSIUM CHLORIDE CRYS ER 20 MEQ PO TBCR: 10.0000 meq | EXTENDED_RELEASE_TABLET | Freq: Every day | ORAL | 1 refills | Status: DC | PRN

## 2021-08-27 MED ORDER — CARVEDILOL 12.5 MG PO TABS
12.5000 mg | ORAL_TABLET | Freq: Two times a day (BID) | ORAL | 0 refills | Status: DC
  Filled 2021-08-27: qty 60, 30d supply, fill #0

## 2021-08-27 MED ORDER — AMOXICILLIN-POT CLAVULANATE 875-125 MG PO TABS: 1.0000 | ORAL_TABLET | Freq: Two times a day (BID) | ORAL | 0 refills | Status: DC

## 2021-08-27 MED ORDER — LOSARTAN POTASSIUM 50 MG PO TABS: 50.0000 mg | ORAL_TABLET | Freq: Every day | ORAL | 0 refills | Status: DC

## 2021-08-27 NOTE — Discharge Summary (Signed)
Physician Discharge Summary  Richard Cox NFA:[id number] DOB: Sep 02, 1981 DOA: 08/24/2021  PCP: Lindell Spar, MD  Admit date: 08/24/2021 Discharge date: 08/27/2021  Admitted From: home Discharge disposition: home   Recommendations for Outpatient Follow-Up:   BMP 1 week re: Cr Per cards:  As he is at high risk for recurrent hospitalization. He has not had any arrhythmias while in hospital. Advanced CHF clinic can see if they want to refer him to EP but again I don't think they would be keen on AICD for primary prevention His social situation and unemployment and losing disability make genetic testing less likely as well   Discharge Diagnosis:   Active Problems:   CHF exacerbation ([name])   HTN (hypertension)   Substance abuse ([name])   AIDS (acquired immune deficiency syndrome) ([name])   Acute respiratory failure with hypoxia (HCC)   Sepsis ([place])   Elevated troponin   Acute lower UTI   Hypokalemia   Endotracheally intubated   Flash pulmonary edema ([name])    Discharge Condition: Improved.  Diet recommendation: Low sodium, heart healthy  Wound care: None.  Code status: Full.   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 Delton Coombes), substance abuse (alcohol and cocaine) who is admitted with acute hypoxic respiratory failure requiring intubation and mechanical ventilation. He presented to [facility] 10/16 for dyspnea BIBEMS. When EMS arrived to evaluate him, had O2 saturation in 60s, placed on BiPAP. On arrival to ED, confursed, pulling mask off, found to be febrile, with remarkable HTN. UDS with cocaine and he endorses use a couple days PTA. Trops 100-200. EKG with newish STEs in precordial leads. He was given full dose aspirin, started on  heparin gtt, broad spectrum ABX, nitro gtt, diuresed, intubated and transferred to [facility] on 10/16.   Hospital Course by Problem:   Acute systolic congestive heart failure/Hypertensive emergency and abnormal EKG with elevated troponins/Acute hypoxemic respiratory failure secondary to flash pulmonary edema and also suspected aspiration pneumonia. - Echocardiogram with Ejection fraction 45 to 50%.  Grade 2 diastolic dysfunction.  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   -MRI cardiac: MRI/Echo:  shows severe asymmetric septal hypertrophy EF 30-35% by MRI Bad prognostic signs for recurrent CHF and sudden death include > 15% gadolinium uptake on delayed inversion recovery sequences and septal shickness 3.1 cm.  He has non obstructive form. It appeared to me that he also had gadolinium uptake in RV He admits to not Rx his BP. Have added back losartan to his other meds as his renal function is not that bad and BP still elevated  His recent cancer Rx for lymphoma with right subclavian port and HIV make advanced therapies less of an option in regard to AICD and also less likely to be transplant candidate in future - Started on carvedilol and hydralazine.   and resumed losartan  -Followed by cardiology.  - On aspirin.   -Completed 48 hours of heparin infusion.   -Extubated to room air. -Cultures negative.  Suspected aspiration.  change to PO abx and finish treatment   -Sepsis present on admission and improving.   -UTI suspected on admission, ruled out.   History of HIV:  -Last CD4 count reported 285.  On Biktarvy.  Continue.  B-cell lymphoma status post chemotherapy:  -Followed by oncology outpatient.  Acute kidney injury -stable   Cocaine use:  -aggravating his cardiovascular issues.  Counseled to quit.        Medical Consultants:   cards   Discharge Exam:   Vitals:   08/27/21 0913 08/27/21 1232  BP: 138/77 (!) 142/95  Pulse: 81 69  Resp:     Temp:  98.3 F (36.8 C)  SpO2:     Vitals:   08/27/21 0815 08/27/21 0819 08/27/21 0913 08/27/21 1232  BP:  (!) 163/115 138/77 (!) 142/95  Pulse:  77 81 69  Resp:      Temp:    98.3 F (36.8 C)  TempSrc:    Oral  SpO2: 92% 93%    Weight:      Height:        General exam: Appears calm and comfortable.    The results of significant diagnostics from this hospitalization (including imaging, microbiology, ancillary and laboratory) are listed below for reference.     Procedures and Diagnostic Studies:   DG Chest 1 View  Result Date: 08/24/2021 CLINICAL DATA:  Confirm existing ETT placement. EXAM: CHEST  1 VIEW COMPARISON:  Chest radiograph 08/24/2021 FINDINGS: The endotracheal tube tip terminates between the thoracic inlet and carina. A nasogastric tube remains in place. Unchanged position of a right chest Port-A-Cath. Improved aeration of the bilateral lungs. There is a persistent opacity in the right base. No pneumothorax. No large pleural effusion. No acute finding in the visualized skeleton. IMPRESSION: 1. Endotracheal tube tip terminates between the thoracic inlet and carina. 2. Improved aeration of the bilateral lungs with persistent right basilar opacity. Electronically Signed   By: Audie Pinto M.D.   On: 08/24/2021 16:53   DG Abd 1 View  Result Date: 08/24/2021 CLINICAL DATA:  Recent vomiting EXAM: ABDOMEN - 1 VIEW COMPARISON:  None. FINDINGS: Gastric catheter is noted within the stomach. Scattered bowel gas is noted without obstructive pattern. No bony abnormality is seen. IMPRESSION: No acute abnormality noted. Electronically Signed   By: Inez Catalina M.D.   On: 08/24/2021 23:38   DG Abd 1 View  Result Date: 08/24/2021 CLINICAL DATA:  OG tube placement EXAM: ABDOMEN - 1 VIEW COMPARISON:  None. FINDINGS: OG tube appears adequately positioned in the stomach. Paucity of small and large bowel gas within the visualized portion of the abdomen. No evidence of free  intraperitoneal air within the visualized portion of the abdomen. IMPRESSION: OG tube adequately positioned in the stomach. Electronically Signed   By: Franki Cabot M.D.   On: 08/24/2021 05:08   DG CHEST PORT 1 VIEW  Result Date: 08/24/2021 CLINICAL DATA:  Recent vomiting EXAM: PORTABLE CHEST 1 VIEW COMPARISON:  08/24/2021 FINDINGS: Cardiac shadow is stable. Endotracheal tube and gastric catheter are noted in satisfactory position. Right-sided chest wall port is noted in satisfactory position. Lungs are well aerated bilaterally with some persistent right basilar opacity stable from the previous exam. No bony abnormality is seen. IMPRESSION: Stable right basilar airspace opacity. Tubes and lines unchanged from the prior study. Electronically Signed   By: Inez Catalina M.D.   On: 08/24/2021 23:32   DG Chest Port 1 View  Result Date: 08/24/2021 CLINICAL DATA:  Shortness of breath EXAM: PORTABLE CHEST 1 VIEW COMPARISON:  07/24/2020 FINDINGS: Cardiac shadow is enlarged but stable. New right chest wall port is noted in satisfactory position. Lungs are well aerated bilaterally. Diffuse airspace opacity is noted bilaterally likely related to  edema. These changes are new from recent PET-CT from 06/26/2021. No bony abnormality is noted. IMPRESSION: Increase airspace opacity bilaterally likely related to edema. Possibility of lymphomatous involvement deserves consideration as well. These changes are new from prior PET-CT from 06/26/2021. Electronically Signed   By: Inez Catalina M.D.   On: 08/24/2021 02:04   DG Chest Port 1V same Day  Result Date: 08/24/2021 CLINICAL DATA:  Orogastric tube placement EXAM: PORTABLE CHEST 1 VIEW COMPARISON:  08/24/2021 FINDINGS: Lower right lung opacities are unchanged. Unchanged position of right chest wall Port-A-Cath. Orogastric tube tip and side port are below the field of view. No pleural effusion or pneumothorax. IMPRESSION: Orogastric tube tip and side port below the field  of view. Electronically Signed   By: Ulyses Jarred M.D.   On: 08/24/2021 03:06   DG Abd Portable 1V  Result Date: 08/24/2021 CLINICAL DATA:  OG tube placement EXAM: PORTABLE ABDOMEN - 1 VIEW COMPARISON:  08/24/2021 FINDINGS: OG tube is in the stomach, unchanged. IMPRESSION: OG tube in the stomach. Electronically Signed   By: Rolm Baptise M.D.   On: 08/24/2021 15:47   ECHOCARDIOGRAM COMPLETE  Result Date: 08/24/2021    ECHOCARDIOGRAM REPORT   Patient Name:   Richard Cox Date of Exam: 08/24/2021 Medical Rec #:  [id number]  Height:       73.0 in Accession #:    [id number] Weight:       167.5 lb Date of Birth:  October 12, 1981   BSA:          1.996 m Patient Age:    14 years   BP:           141/93 mmHg Patient Gender: M          HR:           89 bpm. Exam Location:  [facility] Procedure: 2D Echo, Color Doppler and Cardiac Doppler Indications:    CHF  History:        Patient has prior history of Echocardiogram examinations, most                 recent 11/18/2020. CHF; Risk Factors:Hypertension and Current                 Smoker. HIV, Substance Abuse.  Sonographer:    Leavy Cella RDCS Referring Phys: [id number] ASIA B [place]  1. There is severe asymmetric hypertrophy of the septum up to 29 mm on this study. The PW is severely hypertrophied up to 20 mm. There is no SAM of the mitral valve. There is no LVOT obstruction. Findings could represent hypertrophic cardiomyopathy versus other infiltrative process such as cardiac amyloidosis. Would recommend cardiac MRI for clarification. EF appears to be mildly reduced on this study, EF 45-50%. Left ventricular ejection fraction, by estimation, is 45 to 50%. The left ventricle has mildly decreased function. The left ventricle demonstrates global hypokinesis. There is severe asymmetric left ventricular hypertrophy of the septal segment. Left ventricular diastolic parameters are consistent with Grade II diastolic dysfunction (pseudonormalization).  2. Right  ventricular systolic function is normal. The right ventricular size is normal. Tricuspid regurgitation signal is inadequate for assessing PA pressure.  3. Left atrial size was moderately dilated.  4. The mitral valve is grossly normal. No evidence of mitral valve regurgitation. No evidence of mitral stenosis.  5. The aortic valve is tricuspid. Aortic valve regurgitation is not visualized. No aortic stenosis is present. Comparison(s): Changes from prior study are noted. Severe asymmetric hypertrophy  remains. EF 45-50% on this study. FINDINGS  Left Ventricle: There is severe asymmetric hypertrophy of the septum up to 29 mm on this study. The PW is severely hypertrophied up to 20 mm. There is no SAM of the mitral valve. There is no LVOT obstruction. Findings could represent hypertrophic cardiomyopathy versus other infiltrative process such as cardiac amyloidosis. Would recommend cardiac MRI for clarification. EF appears to be mildly reduced on this study, EF 45-50%. Left ventricular ejection fraction, by estimation, is 45 to 50%. The left ventricle has mildly decreased function. The left ventricle demonstrates global hypokinesis. The left ventricular internal cavity size was small. There is severe asymmetric left ventricular hypertrophy of the septal segment. Left ventricular diastolic parameters are consistent with Grade II diastolic dysfunction (pseudonormalization). Right Ventricle: The right ventricular size is normal. No increase in right ventricular wall thickness. Right ventricular systolic function is normal. Tricuspid regurgitation signal is inadequate for assessing PA pressure. Left Atrium: Left atrial size was moderately dilated. Right Atrium: Right atrial size was normal in size. Pericardium: Trivial pericardial effusion is present. Mitral Valve: The mitral valve is grossly normal. No evidence of mitral valve regurgitation. No evidence of mitral valve stenosis. Tricuspid Valve: The tricuspid valve is  grossly normal. Tricuspid valve regurgitation is trivial. No evidence of tricuspid stenosis. Aortic Valve: The aortic valve is tricuspid. Aortic valve regurgitation is not visualized. No aortic stenosis is present. Pulmonic Valve: The pulmonic valve was grossly normal. Pulmonic valve regurgitation is not visualized. No evidence of pulmonic stenosis. Aorta: The aortic root and ascending aorta are structurally normal, with no evidence of dilitation. Venous: IVC assessment for right atrial pressure unable to be performed due to mechanical ventilation. IAS/Shunts: The atrial septum is grossly normal.  LEFT VENTRICLE PLAX 2D LVIDd:         4.20 cm   Diastology LVIDs:         3.30 cm   LV e' medial:    3.44 cm/s LV PW:         2.50 cm   LV E/e' medial:  16.3 LV IVS:        2.90 cm   LV e' lateral:   4.25 cm/s LVOT diam:     2.50 cm   LV E/e' lateral: 13.2 LV SV:         60 LV SV Index:   30 LVOT Area:     4.91 cm  RIGHT VENTRICLE RV Basal diam:  3.90 cm RV Mid diam:    3.40 cm RV S prime:     13.60 cm/s TAPSE (M-mode): 2.0 cm LEFT ATRIUM           Index        RIGHT ATRIUM           Index LA diam:      3.80 cm 1.90 cm/m   RA Area:     15.80 cm LA Vol (A2C): 46.1 ml 23.10 ml/m  RA Volume:   43.10 ml  21.60 ml/m LA Vol (A4C): 94.8 ml 47.50 ml/m  AORTIC VALVE LVOT Vmax:   85.30 cm/s LVOT Vmean:  50.800 cm/s LVOT VTI:    0.123 m  AORTA Ao Root diam: 3.40 cm MITRAL VALVE MV Area (PHT): 4.89 cm    SHUNTS MV Decel Time: 155 msec    Systemic VTI:  0.12 m MV E velocity: 56.00 cm/s  Systemic Diam: 2.50 cm MV A velocity: 44.90 cm/s MV E/A ratio:  1.25 Eleonore Chiquito MD Electronically signed  by Eleonore Chiquito MD Signature Date/Time: 08/24/2021/3:07:16 PM    Final      Labs:   Basic Metabolic Panel: Recent Labs  Lab 08/24/21 0155 08/24/21 6237 08/24/21 1855 08/25/21 6283 08/25/21 0410 08/26/21 0112 08/27/21 0506  NA 136 138  --  137 138 135 135  K 3.4* 4.2  --  3.8 3.8 3.6 3.8  CL 103 107  --  106  --  101 99   CO2 24 25  --  22  --  25 23  GLUCOSE 249* 83  --  94  --  104* 96  BUN 19 22*  --  25*  --  26* 29*  CREATININE 1.32* 1.50*  --  1.35*  --  1.19 1.26*  CALCIUM 8.4* 8.0*  --  8.5*  --  8.7* 9.5  MG  --  1.9 2.0 2.0  --  2.3 2.0  PHOS  --   --  4.4 3.2  --  3.3 3.7   GFR Estimated Creatinine Clearance: 89.8 mL/min (A) (by C-G formula based on SCr of 1.26 mg/dL (H)). Liver Function Tests: Recent Labs  Lab 08/24/21 0155 08/24/21 0458 08/25/21 0336 08/26/21 0112 08/27/21 0506  AST 34 30 18 22 22   ALT 22 19 16 15 20   ALKPHOS 96 77 72 79 82  BILITOT 0.7 0.6 0.8 0.5 0.3  PROT 7.6 6.3* 5.9* 6.9 7.7  ALBUMIN 3.9 3.4* 2.9* 3.3* 3.4*   No results for input(s): LIPASE, AMYLASE in the last 168 hours. No results for input(s): AMMONIA in the last 168 hours. Coagulation profile No results for input(s): INR, PROTIME in the last 168 hours.  CBC: Recent Labs  Lab 08/24/21 0155 08/24/21 0458 08/25/21 0336 08/25/21 0410 08/26/21 0112 08/27/21 0506  WBC 16.6* 5.8 4.2  --  4.1 4.5  NEUTROABS 11.1*  --   --   --   --   --   HGB 13.3 11.1* 10.0* 10.5* 11.4* 12.2*  HCT 41.8 33.7* 30.0* 31.0* 34.7* 37.3*  MCV 94.6 92.6 89.6  --  89.9 89.4  PLT 219 122* 104*  --  134* 160   Cardiac Enzymes: Recent Labs  Lab 08/24/21 1855  CKTOTAL 151   BNP: Invalid input(s): POCBNP CBG: Recent Labs  Lab 08/25/21 1105 08/25/21 1528 08/25/21 1946 08/25/21 2333 08/27/21 0809  GLUCAP 119* 97 105* 127* 90   D-Dimer No results for input(s): DDIMER in the last 72 hours. Hgb A1c Recent Labs    08/25/21 0336  HGBA1C 5.1   Lipid Profile Recent Labs    08/25/21 0336  CHOL 183  HDL 25*  LDLCALC 134*  TRIG 121  120  CHOLHDL 7.3   Thyroid function studies No results for input(s): TSH, T4TOTAL, T3FREE, THYROIDAB in the last 72 hours.  Invalid input(s): FREET3 Anemia work up No results for input(s): VITAMINB12, FOLATE, FERRITIN, TIBC, IRON, RETICCTPCT in the last 72  hours. Microbiology Recent Results (from the past 240 hour(s))  Resp Panel by RT-PCR (Flu A&B, Covid) Nasopharyngeal Swab     Status: None   Collection Time: 08/24/21  2:15 AM   Specimen: Nasopharyngeal Swab; Nasopharyngeal(NP) swabs in vial transport medium  Result Value Ref Range Status   SARS Coronavirus 2 by RT PCR NEGATIVE NEGATIVE Final    Comment: (NOTE) SARS-CoV-2 target nucleic acids are NOT DETECTED.  The SARS-CoV-2 RNA is generally detectable in upper respiratory specimens during the acute phase of infection. The lowest concentration of SARS-CoV-2 viral copies this assay can  detect is 138 copies/mL. A negative result does not preclude SARS-Cov-2 infection and should not be used as the sole basis for treatment or other patient management decisions. A negative result may occur with  improper specimen collection/handling, submission of specimen other than nasopharyngeal swab, presence of viral mutation(s) within the areas targeted by this assay, and inadequate number of viral copies(<138 copies/mL). A negative result must be combined with clinical observations, patient history, and epidemiological information. The expected result is Negative.  Fact Sheet for Patients:  EntrepreneurPulse.com.au  Fact Sheet for Healthcare Providers:  IncredibleEmployment.be  This test is no t yet approved or cleared by the [place] FDA and  has been authorized for detection and/or diagnosis of SARS-CoV-2 by FDA under an Emergency Use Authorization (EUA). This EUA will remain  in effect (meaning this test can be used) for the duration of the COVID-19 declaration under Section 564(b)(1) of the Act, 21 U.S.C.section 360bbb-3(b)(1), unless the authorization is terminated  or revoked sooner.       Influenza A by PCR NEGATIVE NEGATIVE Final   Influenza B by PCR NEGATIVE NEGATIVE Final    Comment: (NOTE) The Xpert Xpress SARS-CoV-2/FLU/RSV plus assay  is intended as an aid in the diagnosis of influenza from Nasopharyngeal swab specimens and should not be used as a sole basis for treatment. Nasal washings and aspirates are unacceptable for Xpert Xpress SARS-CoV-2/FLU/RSV testing.  Fact Sheet for Patients: EntrepreneurPulse.com.au  Fact Sheet for Healthcare Providers: IncredibleEmployment.be  This test is not yet approved or cleared by the [place] FDA and has been authorized for detection and/or diagnosis of SARS-CoV-2 by FDA under an Emergency Use Authorization (EUA). This EUA will remain in effect (meaning this test can be used) for the duration of the COVID-19 declaration under Section 564(b)(1) of the Act, 21 U.S.C. section 360bbb-3(b)(1), unless the authorization is terminated or revoked.  Performed at [facility], [address], [place], [place] [address]   Blood culture (routine x 2)     Status: None (Preliminary result)   Collection Time: 08/24/21  3:03 AM   Specimen: BLOOD LEFT HAND  Result Value Ref Range Status   Specimen Description BLOOD LEFT HAND  Final   Special Requests   Final    BOTTLES DRAWN AEROBIC AND ANAEROBIC Blood Culture adequate volume   Culture   Final    NO GROWTH 3 DAYS Performed at [facility], [address], [place], [place] [address]    Report Status PENDING  Incomplete  Blood culture (routine x 2)     Status: None (Preliminary result)   Collection Time: 08/24/21  3:08 AM   Specimen: BLOOD LEFT FOREARM  Result Value Ref Range Status   Specimen Description BLOOD LEFT FOREARM  Final   Special Requests   Final    BOTTLES DRAWN AEROBIC AND ANAEROBIC Blood Culture results may not be optimal due to an excessive volume of blood received in culture bottles   Culture   Final    NO GROWTH 3 DAYS Performed at [facility], [address] [place], [place] [address]    Report Status PENDING  Incomplete  Urine Culture     Status: None   Collection Time:  08/24/21  4:10 AM   Specimen: Urine, Clean Catch  Result Value Ref Range Status   Specimen Description   Final    URINE, CLEAN CATCH Performed at [facility], [address], [place], [place] [address]    Special Requests   Final  NONE Performed at [facility], [address] [place], [place] [address]    Culture   Final    NO GROWTH Performed at [facility], [place] [address] [place], [place] [address]    Report Status 08/25/2021 FINAL  Final  MRSA Next Gen by PCR, Nasal     Status: None   Collection Time: 08/24/21  1:10 PM   Specimen: Nasal Mucosa; Nasal Swab  Result Value Ref Range Status   MRSA by PCR Next Gen NOT DETECTED NOT DETECTED Final    Comment: (NOTE) The GeneXpert MRSA Assay (FDA approved for NASAL specimens only), is one component of a comprehensive MRSA colonization surveillance program. It is not intended to diagnose MRSA infection nor to guide or monitor treatment for MRSA infections. Test performance is not FDA approved in patients less than 69 years old. Performed at [facility], [place] [address], [place], [place] [address]   Culture, Respiratory w Gram Stain     Status: None   Collection Time: 08/24/21  9:30 PM   Specimen: Tracheal Aspirate; Respiratory  Result Value Ref Range Status   Specimen Description TRACHEAL ASPIRATE  Final   Special Requests NONE  Final   Gram Stain   Final    FEW WBC PRESENT, PREDOMINANTLY MONONUCLEAR RARE GRAM POSITIVE COCCI IN PAIRS    Culture   Final    RARE Normal respiratory flora-no Staph aureus or Pseudomonas seen Performed at [facility], [address], [place], [place] [address]    Report Status 08/27/2021 FINAL  Final     Discharge Instructions:   Discharge Instructions     (HEART FAILURE PATIENTS) Call MD:  Anytime you have any of the following symptoms: 1) 3 pound weight gain in 24 hours or 5 pounds in 1 week 2) shortness of breath, with or without a dry hacking cough 3) swelling in the  hands, feet or stomach 4) if you have to sleep on extra pillows at night in order to breathe.   Complete by: As directed    Diet - low sodium heart healthy   Complete by: As directed    Heart Failure patients record your daily weight using the same scale at the same time of day   Complete by: As directed    Increase activity slowly   Complete by: As directed    STOP any activity that causes chest pain, shortness of breath, dizziness, sweating, or exessive weakness   Complete by: As directed       Allergies as of 08/27/2021       Reactions   Neosporin [neomycin-bacitracin Zn-polymyx] Rash        Medication List     STOP taking these medications    allopurinol 300 MG tablet Commonly known as: ZYLOPRIM   lidocaine-prilocaine cream Commonly known as: EMLA   oxyCODONE 5 MG immediate release tablet Commonly known as: Oxy IR/ROXICODONE   prochlorperazine 10 MG tablet Commonly known as: COMPAZINE       TAKE these medications    acetaminophen 500 MG tablet Commonly known as: TYLENOL Take 1,000 mg by mouth every 6 (six) hours as needed for moderate pain or headache.   amoxicillin-clavulanate 875-125 MG tablet Commonly known as: AUGMENTIN Take 1 tablet by mouth every 12 (twelve) hours.   aspirin 81 MG chewable tablet Chew 1 tablet (81 mg total) by mouth daily. Start taking on: August 28, 2021   Biktarvy 50-200-25 MG Tabs tablet Generic drug: bictegravir-emtricitabine-tenofovir AF  TAKE 1 TABLET BY MOUTH 1 TIME A DAY. What changed:  how much to take how to take this when to take this additional instructions   carvedilol 12.5 MG tablet Commonly known as: COREG Take 1 tablet (12.5 mg total) by mouth 2 (two) times daily with a meal. What changed:  medication strength how much to take   furosemide 40 MG tablet Commonly known as: LASIX Take 1 tablet (40 mg total) by mouth daily as needed for fluid. What changed:  when to take this reasons to take this    hydrALAZINE 50 MG tablet Commonly known as: APRESOLINE Take 1 tablet (50 mg total) by mouth 3 (three) times daily.   losartan 50 MG tablet Commonly known as: COZAAR Take 1 tablet (50 mg total) by mouth daily.   potassium chloride SA 20 MEQ tablet Commonly known as: KLOR-CON Take 0.5 tablets (10 mEq total) by mouth daily as needed (when taking lasix). What changed:  when to take this reasons to take this        Follow-up Information     Lindell Spar, MD Follow up in 1 week(s).   Specialty: Internal Medicine Why: BMP Contact information: [address] [place] [address] [phone]         Satira Sark, MD Follow up.   Specialty: Cardiology Why: 1 week BMP re: Cr Contact information: [place] [address] [phone]                  Time coordinating discharge: 35 min  Signed:  Geradine Girt DO  [facility] 08/27/2021, 2:21 PM

## 2021-08-27 NOTE — Progress Notes (Addendum)
Progress Note  Patient Name: Richard Cox Date of Encounter: 08/27/2021  [facility] Cardiologist: Rozann Lesches, MD   Subjective   Feeling well. No chest pain, sob or palpitations.    Inpatient Medications    Scheduled Meds:  aspirin  81 mg Oral Daily   bictegravir-emtricitabine-tenofovir AF  1 tablet Oral Daily   carvedilol  12.5 mg Oral BID WC   Chlorhexidine Gluconate Cloth  6 each Topical Daily   feeding supplement  1 Container Oral TID BM   heparin injection (subcutaneous)  5,000 Units Subcutaneous Q8H   hydrALAZINE  50 mg Oral Q8H   mouth rinse  15 mL Mouth Rinse BID   polyethylene glycol  17 g Oral Daily   umeclidinium-vilanterol  1 puff Inhalation Daily   Continuous Infusions:  ceFEPime (MAXIPIME) IV 2 g (08/27/21 0425)   PRN Meds: acetaminophen **OR** acetaminophen, albuterol, [DISCONTINUED] ondansetron **OR** ondansetron (ZOFRAN) IV, phenol   Vital Signs    Vitals:   08/27/21 0000 08/27/21 0400 08/27/21 0815 08/27/21 0819  BP: (!) 137/92 (!) 147/94  (!) 163/115  Pulse: 73 69  77  Resp: 19 20    Temp: 98.2 F (36.8 C) 98 F (36.7 C)    TempSrc: Oral Oral    SpO2: 97% 97% 92%   Weight:      Height:        Intake/Output Summary (Last 24 hours) at 08/27/2021 0823 Last data filed at 08/27/2021 0425 Gross per 24 hour  Intake 940 ml  Output 3100 ml  Net -2160 ml   Last 3 Weights 08/25/2021 08/24/2021 08/24/2021  Weight (lbs) 182 lb 1.6 oz 182 lb 1.6 oz 167 lb 8.8 oz  Weight (kg) 82.6 kg 82.6 kg 76 kg      Telemetry    NSR - Personally Reviewed  ECG    N/A  Physical Exam   GEN: No acute distress.   Neck: No JVD Cardiac: RRR, no murmurs, rubs, or gallops.  Respiratory: Clear to auscultation bilaterally. GI: Soft, nontender, non-distended  MS: No edema; No deformity. Neuro:  Nonfocal  Psych: Normal affect   Labs    High Sensitivity Troponin:   Recent Labs  Lab 08/24/21 0155 08/24/21 0351 08/24/21 0710 08/24/21 0946  08/24/21 1143  TROPONINIHS 96* 155* 177* 169* 177*     Chemistry Recent Labs  Lab 08/25/21 0336 08/25/21 0410 08/26/21 0112 08/27/21 0506  NA 137 138 135 135  K 3.8 3.8 3.6 3.8  CL 106  --  101 99  CO2 22  --  25 23  GLUCOSE 94  --  104* 96  BUN 25*  --  26* 29*  CREATININE 1.35*  --  1.19 1.26*  CALCIUM 8.5*  --  8.7* 9.5  MG 2.0  --  2.3 2.0  PROT 5.9*  --  6.9 7.7  ALBUMIN 2.9*  --  3.3* 3.4*  AST 18  --  22 22  ALT 16  --  15 20  ALKPHOS 72  --  79 82  BILITOT 0.8  --  0.5 0.3  GFRNONAA >60  --  >60 >60  ANIONGAP 9  --  9 13    Lipids  Recent Labs  Lab 08/25/21 0336  CHOL 183  TRIG 121  120  HDL 25*  LDLCALC 134*  CHOLHDL 7.3    Hematology Recent Labs  Lab 08/25/21 0336 08/25/21 0410 08/26/21 0112 08/27/21 0506  WBC 4.2  --  4.1 4.5  RBC 3.35*  --  3.86* 4.17*  HGB 10.0* 10.5* 11.4* 12.2*  HCT 30.0* 31.0* 34.7* 37.3*  MCV 89.6  --  89.9 89.4  MCH 29.9  --  29.5 29.3  MCHC 33.3  --  32.9 32.7  RDW 15.6*  --  15.1 14.7  PLT 104*  --  134* 160   Thyroid  Recent Labs  Lab 08/24/21 0946  TSH 0.786    BNP Recent Labs  Lab 08/24/21 0155  BNP 1,714.0*      Radiology    No results found.  Cardiac Studies   Echo 08/24/2021  1. There is severe asymmetric hypertrophy of the septum up to 29 mm on  this study. The PW is severely hypertrophied up to 20 mm. There is no SAM  of the mitral valve. There is no LVOT obstruction. Findings could  represent hypertrophic cardiomyopathy  versus other infiltrative process such as cardiac amyloidosis. Would  recommend cardiac MRI for clarification. EF appears to be mildly reduced  on this study, EF 45-50%. Left ventricular ejection fraction, by  estimation, is 45 to 50%. The left ventricle  has mildly decreased function. The left ventricle demonstrates global  hypokinesis. There is severe asymmetric left ventricular hypertrophy of  the septal segment. Left ventricular diastolic parameters are consistent   with Grade II diastolic dysfunction  (pseudonormalization).   2. Right ventricular systolic function is normal. The right ventricular  size is normal. Tricuspid regurgitation signal is inadequate for assessing  PA pressure.   3. Left atrial size was moderately dilated.   4. The mitral valve is grossly normal. No evidence of mitral valve  regurgitation. No evidence of mitral stenosis.   5. The aortic valve is tricuspid. Aortic valve regurgitation is not  visualized. No aortic stenosis is present.   Comparison(s): Changes from prior study are noted. Severe asymmetric  hypertrophy remains. EF 45-50% on this study.   Patient Profile     40 y.o. 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 Delton Coombes), HIV and substance abuse (alcohol and cocaine) who is being seen 08/24/2021 for the evaluation of CHF and abnormal EKG at the request of Dr. [place] Crane. Admitted for acute hypoxic respiratory failure requiring intubation.   Assessment & Plan    Acute hypoxic respiratory failure 2nd to flash pulmonary edema, HTN urgency and suspected aspiration pneumonia - Cocaine positive this admission - Per primary team  2. Acute on chronic combined CHF - He did have a reduced EF of 40 to 45% by echocardiogram in 03/2019 but this had improved to 55 to 60% by repeat echo in 11/2020. BNP was elevated to 1714 on admission. - Net I & O negative 5.5L. No daily weight - Echo this admission showed LVEF ot 45-50% - On hydralazine 50mg  TID and Coreg 12.5mg  BID - Now off lasix today given slight bump in Scr  3. Hypertensive urgency - BP remain elevated on current medications - Given hx of non compliance, consider daily or BID dose medications with titratation  4. Septal  hypertrophy - Echo with  severe asymmetric hypertrophy of the septum up to 29 mm on  this study. The PW is severely hypertrophied up to 20 mm. There is no SAM  of the mitral valve. There is no LVOT obstruction. Findings could  represent hypertrophic  cardiomyopathy  versus other infiltrative process such as cardiac amyloidosis.  - Cardiac MRI pending interpretation   5. Cacaine use - Cessation recommended   6. AKI - Scr 1.32 on admit which bumped to 1.5. Was 1.19 yesterday >> 1.26 today - Off lasix   7. Abnormal EKG -He does have baseline EKG abnormalities with deep TWI noted on prior tracings. He did have ST changes earlier today showing ST elevation along V3 to V4 with ST depression along the inferior and lateral leads but Hs Troponin values have overall been flat, peaking at 177. - No chest pain   For questions or updates, please contact [place] Please consult [email or web address] for contact info under     Jenkins Rouge MD [facility]

## 2021-08-27 NOTE — Progress Notes (Signed)
Nursing dc note  Patient alert and oriented. Verbalized understanding of dc instructions. Vss. Awaiting meds from toc and iv team to de access port. Parent on the way to take patient home. All belongings given to patient.

## 2021-08-27 NOTE — Evaluation (Signed)
Physical Therapy Evaluation Patient Details Name: Richard Cox MRN: [id number] DOB: 10/25/1981 Today's Date: 08/27/2021  History of Present Illness  40 y.o. male [facility] to [facility] hospital on 08/24/2021 with dyspnea. Pt admitted for management of acute systolic congestive heart failure with flash pulmonary edema. Pt intubated on 08/24/2021, extubated 08/25/2021. PMH includes heart failure with reduced ejection fraction, infiltrative cardiomyopathy, HIV, B-cell lymphoma treated with chemotherapy, substance abuse.  Clinical Impression  Pt presets to PT near functional baseline. Pt is able to ambulate and transfer independently and tolerates multiple dynamic gait challenges without significant deviation. Pt denies concerns about mobility at this time. PT encourages the pt to ambulate at least 3 times out of the room daily for the remainder of admission. PT recommends discharge home when medically ready. Pt has no further acute PT needs. Acute PT signing off.      Recommendations for follow up therapy are one component of a multi-disciplinary discharge planning process, led by the attending physician.  Recommendations may be updated based on patient status, additional functional criteria and insurance authorization.  Follow Up Recommendations No PT follow up    Equipment Recommendations  None recommended by PT    Recommendations for Other Services       Precautions / Restrictions Precautions Precautions: None Restrictions Weight Bearing Restrictions: No      Mobility  Bed Mobility                    Transfers Overall transfer level: Independent                  Ambulation/Gait Ambulation/Gait assistance: Independent Gait Distance (Feet): 300 Feet Assistive device: None Gait Pattern/deviations: WFL(Within Functional Limits) Gait velocity: functional Gait velocity interpretation: >4.37 ft/sec, indicative of normal walking speed General Gait Details: pt is able to  tolerate multiple dynamic gait challenges including head turns, abrupt stops and turns, changes of gait speed and stride length, all without loss of balance  Stairs Stairs:  (pt declines stair assessment)          Wheelchair Mobility    Modified Rankin (Stroke Patients Only)       Balance Overall balance assessment: Independent                                           Pertinent Vitals/Pain Pain Assessment: No/denies pain    Home Living Family/patient expects to be discharged to:: Private residence Living Arrangements: Parent;Other relatives Available Help at Discharge: Family;Available 24 hours/day Type of Home: House Home Access: Stairs to enter Entrance Stairs-Rails: Can reach both Entrance Stairs-Number of Steps: 4 Home Layout: One level;Multi-level;Able to live on main level with bedroom/bathroom Home Equipment: None      Prior Function Level of Independence: Independent         Comments: driving, on disability     Hand Dominance        Extremity/Trunk Assessment   Upper Extremity Assessment Upper Extremity Assessment: Overall WFL for tasks assessed    Lower Extremity Assessment Lower Extremity Assessment: Overall WFL for tasks assessed    Cervical / Trunk Assessment Cervical / Trunk Assessment: Normal  Communication   Communication: No difficulties  Cognition Arousal/Alertness: Awake/alert Behavior During Therapy: WFL for tasks assessed/performed Overall Cognitive Status: Within Functional Limits for tasks assessed  General Comments General comments (skin integrity, edema, etc.): pt with tachypnea into high 30s but denies SOB    Exercises     Assessment/Plan    PT Assessment Patent does not need any further PT services  PT Problem List         PT Treatment Interventions      PT Goals (Current goals can be found in the Care Plan section)       Frequency      Barriers to discharge        Co-evaluation               AM-PAC PT "6 Clicks" Mobility  Outcome Measure Help needed turning from your back to your side while in a flat bed without using bedrails?: None Help needed moving from lying on your back to sitting on the side of a flat bed without using bedrails?: None Help needed moving to and from a bed to a chair (including a wheelchair)?: None Help needed standing up from a chair using your arms (e.g., wheelchair or bedside chair)?: None Help needed to walk in hospital room?: None Help needed climbing 3-5 steps with a railing? : None 6 Click Score: 24    End of Session   Activity Tolerance: Patient tolerated treatment well Patient left: in chair;with call bell/phone within reach Nurse Communication: Mobility status PT Visit Diagnosis: Other abnormalities of gait and mobility (R26.89)    Time: 1771-1657 PT Time Calculation (min) (ACUTE ONLY): 11 min   Charges:   PT Evaluation $PT Eval Low Complexity: [place], PT, DPT Acute Rehabilitation Pager: [place]   Zenaida Niece 08/27/2021, 9:46 AM

## 2021-08-27 NOTE — Progress Notes (Signed)
PROGRESS NOTE    Richard Cox  ULA:[id number] DOB: 1980/12/16 DOA: 08/24/2021 PCP: Lindell Spar, MD    Brief Narrative:  40 year old with heart failure with reduced ejection fraction, infiltrative cardiomyopathy, HIV, B-cell lymphoma treated with chemotherapy, substance abuse alcohol and cocaine admitted with acute hypoxemic respiratory failure requiring intubation mechanical ventilation.  Initially placed on BiPAP from home, confused pulling off mask, febrile with remarkable elevated blood pressures needing mechanical ventilation.  UDS positive for cocaine.  Mild elevated troponins.  EKG with ST elevations in precordial leads.  10/16, intubated, cardiology consulted.  Transferred to [facility].  Treated for flash pulmonary edema along with aspiration pneumonia.  Treated with Zyvox and cefepime.  Last CD4 count 285. 10/17 extubated, Zyvox stopped.  Diuresed.  Blood pressure medications resumed. 10/18, transferred to [facility].   Assessment & Plan:   Active Problems:   CHF exacerbation (HCC)   HTN (hypertension)   Substance abuse ([place])   AIDS (acquired immune deficiency syndrome) ([place])   Acute respiratory failure with hypoxia (HCC)   Sepsis (HCC)   Elevated troponin   Acute lower UTI   Hypokalemia   Endotracheally intubated   Flash pulmonary edema (HCC)   Acute systolic congestive heart failure/Hypertensive emergency and abnormal EKG with elevated troponins/Acute hypoxemic respiratory failure secondary to flash pulmonary edema and also suspected aspiration pneumonia. - Echocardiogram with Ejection fraction 45 to 50%.  Grade 2 diastolic dysfunction.  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   -MRI cardiac pending read  - Started on carvedilol and hydralazine.   -Followed by cardiology.  - On aspirin.   -Completed 48 hours of heparin infusion.   -Extubated to room air. -Cultures negative.  Suspected aspiration.  Remains on cefepime.   Will complete 5 days of IV antibiotic therapy.   -Sepsis present on admission and improving.   -UTI suspected on admission, ruled out.  History of HIV:  -Last CD4 count reported 285.  On Biktarvy.  Continue.  B-cell lymphoma status post chemotherapy:  -Followed by oncology outpatient.  Acute kidney injury -monitor closely while diuresing -diuretics held due to worsening function  Cocaine use:  -aggravating his cardiovascular issues.  Counseled to quit.      DVT prophylaxis: heparin injection 5,000 Units Start: 08/25/21 1400 SCDs Start: 08/24/21 2122   Code Status: Full code Family Communication: None Disposition Plan: Status is: Inpatient  Remains inpatient appropriate because: IV diuresis.  Cardiac work-up.       Consultants:  Cardiology PCCM  Procedures:  Cardiac MRI.  Pending    Subjective: Wondering about going home  Objective: Vitals:   08/27/21 0400 08/27/21 0815 08/27/21 0819 08/27/21 0913  BP: (!) 147/94  (!) 163/115 138/77  Pulse: 69  77 81  Resp: 20     Temp: 98 F (36.7 C)     TempSrc: Oral     SpO2: 97% 92% 93%   Weight:      Height:        Intake/Output Summary (Last 24 hours) at 08/27/2021 1141 Last data filed at 08/27/2021 0425 Gross per 24 hour  Intake 940 ml  Output 3100 ml  Net -2160 ml   Filed Weights   08/24/21 0151 08/24/21 2309 08/25/21 0335  Weight: 76 kg 82.6 kg 82.6 kg    Examination:   General: Appearance:    Well developed, well nourished male in no acute distress, sitting in chair     Lungs:     respirations unlabored  Heart:  Normal heart rate.   MS:   All extremities are intact. No LE edema   Neurologic:   Awake, alert, oriented x 3. No apparent focal neurological           defect.       Data Reviewed: I have personally reviewed following labs and imaging studies  CBC: Recent Labs  Lab 08/24/21 0155 08/24/21 0458 08/25/21 0336 08/25/21 0410 08/26/21 0112 08/27/21 0506  WBC 16.6* 5.8 4.2   --  4.1 4.5  NEUTROABS 11.1*  --   --   --   --   --   HGB 13.3 11.1* 10.0* 10.5* 11.4* 12.2*  HCT 41.8 33.7* 30.0* 31.0* 34.7* 37.3*  MCV 94.6 92.6 89.6  --  89.9 89.4  PLT 219 122* 104*  --  134* 017   Basic Metabolic Panel: Recent Labs  Lab 08/24/21 0155 08/24/21 0458 08/24/21 1855 08/25/21 0336 08/25/21 0410 08/26/21 0112 08/27/21 0506  NA 136 138  --  137 138 135 135  K 3.4* 4.2  --  3.8 3.8 3.6 3.8  CL 103 107  --  106  --  101 99  CO2 24 25  --  22  --  25 23  GLUCOSE 249* 83  --  94  --  104* 96  BUN 19 22*  --  25*  --  26* 29*  CREATININE 1.32* 1.50*  --  1.35*  --  1.19 1.26*  CALCIUM 8.4* 8.0*  --  8.5*  --  8.7* 9.5  MG  --  1.9 2.0 2.0  --  2.3 2.0  PHOS  --   --  4.4 3.2  --  3.3 3.7   GFR: Estimated Creatinine Clearance: 89.8 mL/min (A) (by C-G formula based on SCr of 1.26 mg/dL (H)). Liver Function Tests: Recent Labs  Lab 08/24/21 0155 08/24/21 0458 08/25/21 0336 08/26/21 0112 08/27/21 0506  AST 34 30 18 22 22   ALT 22 19 16 15 20   ALKPHOS 96 77 72 79 82  BILITOT 0.7 0.6 0.8 0.5 0.3  PROT 7.6 6.3* 5.9* 6.9 7.7  ALBUMIN 3.9 3.4* 2.9* 3.3* 3.4*   No results for input(s): LIPASE, AMYLASE in the last 168 hours. No results for input(s): AMMONIA in the last 168 hours. Coagulation Profile: No results for input(s): INR, PROTIME in the last 168 hours. Cardiac Enzymes: Recent Labs  Lab 08/24/21 1855  CKTOTAL 151   BNP (last 3 results) No results for input(s): PROBNP in the last 8760 hours. HbA1C: Recent Labs    08/25/21 0336  HGBA1C 5.1   CBG: Recent Labs  Lab 08/25/21 1105 08/25/21 1528 08/25/21 1946 08/25/21 2333 08/27/21 0809  GLUCAP 119* 97 105* 127* 90   Lipid Profile: Recent Labs    08/25/21 0336  CHOL 183  HDL 25*  LDLCALC 134*  TRIG 121  120  CHOLHDL 7.3   Thyroid Function Tests: No results for input(s): TSH, T4TOTAL, FREET4, T3FREE, THYROIDAB in the last 72 hours.  Anemia Panel: No results for input(s): VITAMINB12,  FOLATE, FERRITIN, TIBC, IRON, RETICCTPCT in the last 72 hours. Sepsis Labs: Recent Labs  Lab 08/24/21 0155 08/24/21 0458 08/24/21 0710 08/24/21 0946 08/24/21 1143  PROCALCITON  --   --   --   --  6.97  LATICACIDVEN 3.8* 0.8 0.8 0.7  --     Recent Results (from the past 240 hour(s))  Resp Panel by RT-PCR (Flu A&B, Covid) Nasopharyngeal Swab     Status: None  Collection Time: 08/24/21  2:15 AM   Specimen: Nasopharyngeal Swab; Nasopharyngeal(NP) swabs in vial transport medium  Result Value Ref Range Status   SARS Coronavirus 2 by RT PCR NEGATIVE NEGATIVE Final    Comment: (NOTE) SARS-CoV-2 target nucleic acids are NOT DETECTED.  The SARS-CoV-2 RNA is generally detectable in upper respiratory specimens during the acute phase of infection. The lowest concentration of SARS-CoV-2 viral copies this assay can detect is 138 copies/mL. A negative result does not preclude SARS-Cov-2 infection and should not be used as the sole basis for treatment or other patient management decisions. A negative result may occur with  improper specimen collection/handling, submission of specimen other than nasopharyngeal swab, presence of viral mutation(s) within the areas targeted by this assay, and inadequate number of viral copies(<138 copies/mL). A negative result must be combined with clinical observations, patient history, and epidemiological information. The expected result is Negative.  Fact Sheet for Patients:  EntrepreneurPulse.com.au  Fact Sheet for Healthcare Providers:  IncredibleEmployment.be  This test is no t yet approved or cleared by the [place] FDA and  has been authorized for detection and/or diagnosis of SARS-CoV-2 by FDA under an Emergency Use Authorization (EUA). This EUA will remain  in effect (meaning this test can be used) for the duration of the COVID-19 declaration under Section 564(b)(1) of the Act, 21 U.S.C.section  360bbb-3(b)(1), unless the authorization is terminated  or revoked sooner.       Influenza A by PCR NEGATIVE NEGATIVE Final   Influenza B by PCR NEGATIVE NEGATIVE Final    Comment: (NOTE) The Xpert Xpress SARS-CoV-2/FLU/RSV plus assay is intended as an aid in the diagnosis of influenza from Nasopharyngeal swab specimens and should not be used as a sole basis for treatment. Nasal washings and aspirates are unacceptable for Xpert Xpress SARS-CoV-2/FLU/RSV testing.  Fact Sheet for Patients: EntrepreneurPulse.com.au  Fact Sheet for Healthcare Providers: IncredibleEmployment.be  This test is not yet approved or cleared by the [place] FDA and has been authorized for detection and/or diagnosis of SARS-CoV-2 by FDA under an Emergency Use Authorization (EUA). This EUA will remain in effect (meaning this test can be used) for the duration of the COVID-19 declaration under Section 564(b)(1) of the Act, 21 U.S.C. section 360bbb-3(b)(1), unless the authorization is terminated or revoked.  Performed at [facility], [address], [place], [place] [address]   Blood culture (routine x 2)     Status: None (Preliminary result)   Collection Time: 08/24/21  3:03 AM   Specimen: BLOOD LEFT HAND  Result Value Ref Range Status   Specimen Description BLOOD LEFT HAND  Final   Special Requests   Final    BOTTLES DRAWN AEROBIC AND ANAEROBIC Blood Culture adequate volume   Culture   Final    NO GROWTH 3 DAYS Performed at [facility], [address] [place], [place] [address]    Report Status PENDING  Incomplete  Blood culture (routine x 2)     Status: None (Preliminary result)   Collection Time: 08/24/21  3:08 AM   Specimen: BLOOD LEFT FOREARM  Result Value Ref Range Status   Specimen Description BLOOD LEFT FOREARM  Final   Special Requests   Final    BOTTLES DRAWN AEROBIC AND ANAEROBIC Blood Culture results may not be optimal due to an excessive volume  of blood received in culture bottles   Culture   Final    NO GROWTH 3 DAYS Performed at [facility], [address], [place], [place]  [address]    Report Status PENDING  Incomplete  Urine Culture     Status: None   Collection Time: 08/24/21  4:10 AM   Specimen: Urine, Clean Catch  Result Value Ref Range Status   Specimen Description   Final    URINE, CLEAN CATCH Performed at [facility], [address] [place], [place] [address]    Special Requests   Final    NONE Performed at [facility], [address] [place], [place] [address]    Culture   Final    NO GROWTH Performed at [facility], [place] [address], [place], [place] [address]    Report Status 08/25/2021 FINAL  Final  MRSA Next Gen by PCR, Nasal     Status: None   Collection Time: 08/24/21  1:10 PM   Specimen: Nasal Mucosa; Nasal Swab  Result Value Ref Range Status   MRSA by PCR Next Gen NOT DETECTED NOT DETECTED Final    Comment: (NOTE) The GeneXpert MRSA Assay (FDA approved for NASAL specimens only), is one component of a comprehensive MRSA colonization surveillance program. It is not intended to diagnose MRSA infection nor to guide or monitor treatment for MRSA infections. Test performance is not FDA approved in patients less than 24 years old. Performed at [facility], [place] [address], [place], [place] [address]   Culture, Respiratory w Gram Stain     Status: None   Collection Time: 08/24/21  9:30 PM   Specimen: Tracheal Aspirate; Respiratory  Result Value Ref Range Status   Specimen Description TRACHEAL ASPIRATE  Final   Special Requests NONE  Final   Gram Stain   Final    FEW WBC PRESENT, PREDOMINANTLY MONONUCLEAR RARE GRAM POSITIVE COCCI IN PAIRS    Culture   Final    RARE Normal respiratory flora-no Staph aureus or Pseudomonas seen Performed at [facility], [address] [place], [place] [address]    Report Status 08/27/2021 FINAL  Final         Radiology Studies: MR  CARDIAC MORPHOLOGY W WO CONTRAST  Result Date: 08/27/2021 CLINICAL DATA:  Cardiac amyloidosis vs HCM, further testing COMPARISON: Echocardiogram 08/24/21 EXAM: CARDIAC MRI TECHNIQUE: The patient was scanned on a 1.5 Tesla GE magnet. A dedicated cardiac coil was used. Functional imaging was done using Fiesta sequences. 2,3, and 4 chamber views were done to assess for RWMA's. Modified Simpson's rule using a short axis stack was used to calculate an ejection fraction on a dedicated work Conservation officer, nature. The patient received 72mL GADAVIST GADOBUTROL 1 MMOL/ML IV SOLN. After 10 minutes inversion recovery sequences were used to assess for infiltration and scar tissue. This examination is tailored for evaluation cardiac anatomy and function and provides very limited assessment of noncardiac structures, which are accordingly not evaluated during interpretation. If there is clinical concern for extracardiac pathology, further evaluation with CT imaging should be considered. FINDINGS: LEFT VENTRICLE: Normal left ventricular chamber size. Maximal wall thickness: 31 mm Location: Mid ventricular septum There is no systolic anterior motion of the mitral valve. No flow dephasing to suggests left ventricular outflow tract obstruction. Mitral regurgitation is not seen. No evidence of left ventricular apical aneurysm. 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 Morphologic subtype: Reverse curve Severely reduced LV systolic function. LVEF cannot accurately be calculated but appears 30-35% and appears similar in comparison to echo. Severe global hypokinesis. Elevated T2 signal in the base and mid ventricle. T2 59 msec at  basal and mid, and 52 msec at the apex. Normal first pass perfusion. There is post contrast delayed myocardial enhancement. There is diffuse delayed myocardial enhancement in the area of maximal wall thickness, in the anterior, septal and inferior walls from base to  mid-apical ventricle. At the mid to apical ventricle the lateral wall also appears involved. LGE appears >15% of the myocardial mass. Normal T1 myocardial nulling kinetics suggest against a diagnosis of cardiac amyloidosis. ECV = 34% RIGHT VENTRICLE: Normal right ventricular chamber size. Normal right ventricular wall thickness. Mildly reduced right ventricular systolic function. Mild global hypokinesis. No post contrast delayed myocardial enhancement. ATRIA: Normal left atrial size. Normal right atrial size. VALVES: No significant valvular abnormalities. Tricuspid aortic valve. PERICARDIUM: Normal pericardium.  Trivial pericardial effusion. OTHER: No significant extracardiac findings. Please reference recent PET imaging for full evaluation of patient's known B-cell lymphoma, which is not evaluated on this exam. MEASUREMENTS: Volumes and EF cannot be accurately measured on this study due to technical factors. IMPRESSION: 1. 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 31 mm massive septal hypertrophy in the mid left ventricular septum. Significant diffuse delayed myocardial enhancement appears >15% of myocardial mass. ECV 36%, nonspecific elevated. T2 is 59 msec in area of maximal wall thickness. Findings of >30 mm wall thickness and elevated T2 signal suggest higher risk phenotype HCM. 2. Severely reduced LV systolic function by visual estimate, LVEF 30-35% with global hypokinesis. 2.  Mildly reduced RV function with normal RV size. 4. ECV value and inversion time myocardial nulling kinetics suggest against a diagnosis of cardiac amyloidosis. Electronically Signed   By: Cherlynn Kaiser M.D.   On: 08/27/2021 11:11        Scheduled Meds:  aspirin  81 mg Oral Daily   bictegravir-emtricitabine-tenofovir AF  1 tablet Oral Daily   carvedilol  12.5 mg Oral BID WC   Chlorhexidine Gluconate Cloth  6 each Topical Daily   feeding supplement  1 Container  Oral TID BM   heparin injection (subcutaneous)  5,000 Units Subcutaneous Q8H   hydrALAZINE  50 mg Oral Q8H   mouth rinse  15 mL Mouth Rinse BID   polyethylene glycol  17 g Oral Daily   umeclidinium-vilanterol  1 puff Inhalation Daily   Continuous Infusions:  ceFEPime (MAXIPIME) IV 2 g (08/27/21 0425)     LOS: 3 days    Time spent: 35 minutes    Geradine Girt, DO [facility]

## 2021-08-27 NOTE — Progress Notes (Signed)
Pharmacy Antibiotic Note  Richard Cox is a 40 y.o. male admitted on 08/24/2021 with sepsis from presumed pneumonia.  Patient continues on Cefepime day #3.  Noted stop dat added for 10/21.  Plan: Continue Cefepime 2gm IV q8h Anticipate no further dose adjustments needed. Rx will sign off.  Height: 6\' 1"  (185.4 cm) Weight: 82.6 kg (182 lb 1.6 oz) IBW/kg (Calculated) : 79.9  Temp (24hrs), Avg:98.2 F (36.8 C), Min:98 F (36.7 C), Max:98.3 F (36.8 C)  Recent Labs  Lab 08/24/21 0155 08/24/21 0458 08/24/21 0710 08/24/21 0946 08/25/21 0336 08/26/21 0112 08/27/21 0506  WBC 16.6* 5.8  --   --  4.2 4.1 4.5  CREATININE 1.32* 1.50*  --   --  1.35* 1.19 1.26*  LATICACIDVEN 3.8* 0.8 0.8 0.7  --   --   --      Estimated Creatinine Clearance: 89.8 mL/min (A) (by C-G formula based on SCr of 1.26 mg/dL (H)).    Allergies  Allergen Reactions   Neosporin [Neomycin-Bacitracin Zn-Polymyx] Rash    Antimicrobials this admission: Cefepime 10/16 >> (10/21) Zyvox 10/16 x 1 Vancomycin IV x 1 dose 10/16   Microbiology results: 10/16 BCx: ngtd 10/16 UCx: neg  Thank you for allowing pharmacy to be a part of this patient's care.  [employer], Pharm.D., BCPS Clinical Pharmacist  **Pharmacist phone directory can be found on [email or web address] listed under [place].  08/27/2021 1:13 PM

## 2021-08-27 NOTE — Plan of Care (Signed)

## 2021-08-27 NOTE — Progress Notes (Signed)
Reviewed patients MRI and had long discussion with him regarding diagnosis of hypertrophic cardiomyopathy. No sudden death in family Mom/Dad no heart issues Has an older brother with stroke and heart issues he does not know details. Grandmother may have had heart issues.   MRI/Echo:  shows severe asymmetric septal hypertrophy EF 30-35% by MRI Bad prognostic signs for recurrent CHF and sudden death include > 15% gadolinium uptake on delayed inversion recovery sequences and septal shickness 3.1 cm.  He has non obstructive form. It appeared to me that he also had gadolinium uptake in RV  He admits to not Rx his BP. Have added back losartan to his other meds as his renal function is not that bad and BP still elevated   His recent cancer Rx for lymphoma with right subclavian port and HIV make advanced therapies less of an option in regard to AICD and also less likely to be transplant candidate in future  Ok to d/c home  Will arrange f/u with his primary cardiologist Dr Domenic Polite but also CHF clinic  As he is at high risk for recurrent hospitalization. He has not had any arrhythmias while in hospital. Advanced CHF clinic can see if they want to refer him to EP but again I don't think they would be keen on AICD for primary prevention His social situation and unemployment and losing disability make genetic testing less likely as well   Jenkins Rouge MD [facility]

## 2021-08-27 NOTE — Progress Notes (Signed)
CSW met with pt to address readmission risk items.  Pt confirmed he sees Dr Patel, reports he is trying to get established with new PCP, has a list from [employer], but does not have anything set up currently and agreeable to follow up with Dr Patel in the mean time.  Pt has own vehicle to drive to appts.  CSW attempted to cal l Dr Patel, only able to leave message requesting hospital discharge appt. Greg Wierda, MSW, LCSW 10/19/20223:33 PM  

## 2021-08-28 ENCOUNTER — Telehealth: Payer: Self-pay

## 2021-08-28 ENCOUNTER — Telehealth: Payer: Self-pay | Admitting: *Deleted

## 2021-08-28 NOTE — Telephone Encounter (Signed)
Thank you this was second attempt I tried this morning! Thank you for all that you do

## 2021-08-28 NOTE — Telephone Encounter (Signed)
Transition Care Management Unsuccessful Follow-up Telephone Call  Date of discharge and from where:  1019/2022 [facility]  Diagnosis: COPD Exacerbation   Attempts:  1st Attempt  Reason for unsuccessful TCM follow-up call:  Left voice message

## 2021-08-28 NOTE — Telephone Encounter (Signed)
Transition Care Management Unsuccessful Follow-up Telephone Call  Date of discharge and from where:  08-27-21 [facility]  Attempts:  1st Attempt  Reason for unsuccessful TCM follow-up call:  Left voice message

## 2021-08-29 ENCOUNTER — Telehealth: Payer: Self-pay | Admitting: *Deleted

## 2021-08-29 LAB — CULTURE, BLOOD (ROUTINE X 2)
Culture: NO GROWTH
Culture: NO GROWTH
Special Requests: ADEQUATE

## 2021-08-29 NOTE — Telephone Encounter (Signed)
Transition Care Management Unsuccessful Follow-up Telephone Call  Date of discharge and from where:  08-27-21 [facility]   Attempts:  3rd Attempt  Reason for unsuccessful TCM follow-up call:  Left voice message

## 2021-09-02 ENCOUNTER — Inpatient Hospital Stay (HOSPITAL_COMMUNITY): Payer: 59 | Admitting: Hematology

## 2021-09-02 NOTE — Progress Notes (Shared)
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  INTERVAL HISTORY:  [name], a 40 y.o. male, returns for routine follow-up of his ***. Richard Cox was last seen on {XX/XX/XXXX}.    REVIEW OF SYSTEMS:  Review of Systems - Oncology  PAST MEDICAL/SURGICAL HISTORY:  Past Medical History:  Diagnosis Date   CHF (congestive heart failure) ([place])    Essential hypertension    HIV infection ([place])    Noncompliance with medication regimen    Secondary cardiomyopathy ([place])    Past Surgical History:  Procedure Laterality Date   IR IMAGING GUIDED PORT INSERTION  08/23/2020   Right   NO PAST SURGERIES      SOCIAL HISTORY:  Social History   Socioeconomic History   Marital status: Single    Spouse name: Not on file   Number of children: 0   Years of education: 13   Highest education level: Not on file  Occupational History   Not on file  Tobacco Use   Smoking status: Light Smoker    Packs/day: 0.25    Types: Cigarettes   Smokeless tobacco: Never  Vaping Use   Vaping Use: Never used  Substance and Sexual Activity   Alcohol use: Not Currently   Drug use: Not Currently    Types: Marijuana    Comment: x 1 week   Sexual activity: Not Currently    Comment: pt given condoms  Other Topics Concern   Not on file  Social History Narrative   Not on file    Social Determinants of Health   Financial Resource Strain: Low Risk    Difficulty of Paying Living Expenses: Not very hard  Food Insecurity: No Food Insecurity   Worried About Charity fundraiser in the Last Year: Never true   Ran Out of Food in the Last Year: Never true  Transportation Needs: No Transportation Needs   Lack of Transportation (Medical): No   Lack of Transportation (Non-Medical): No  Physical Activity: Insufficiently Active   Days of Exercise per Week: 5 days   Minutes of Exercise per Session: 20 min  Stress: No Stress Concern Present   Feeling of Stress : Only a little  Social Connections: Socially Isolated   Frequency of Communication with Friends and Family: More than three times a week   Frequency of Social Gatherings with Friends and Family: Never   Attends Religious Services: Never   Printmaker: No   Attends Music therapist: Not on file   Marital Status: Never married  Human resources officer Violence: Not At Risk   Fear of Current or Ex-Partner: No   Emotionally Abused: No   Physically Abused: No   Sexually Abused: No    FAMILY HISTORY:  Family History  Problem Relation Age  of Onset   Diabetes Mellitus II Mother    Cancer Mother        multiple myeloma   Cancer Father        multiple myeloma   Congestive Heart Failure Brother    Heart Problems Brother    Lupus Brother     CURRENT MEDICATIONS:  Current Outpatient Medications  Medication Sig Dispense Refill   acetaminophen (TYLENOL) 500 MG tablet Take 1,000 mg by mouth every 6 (six) hours as needed for moderate pain or headache.     amoxicillin-clavulanate (AUGMENTIN) 875-125 MG tablet Take 1 tablet by mouth every 12 (twelve) hours. 6 tablet 0   aspirin 81 MG chewable tablet Chew 1 tablet (81 mg total) by mouth daily. 30 tablet 0   bictegravir-emtricitabine-tenofovir AF (BIKTARVY) 50-200-25 MG TABS tablet TAKE 1 TABLET BY MOUTH 1 TIME A DAY. (Patient taking  differently: Take 1 tablet by mouth daily.) 30 tablet 5   carvedilol (COREG) 12.5 MG tablet Take 1 tablet (12.5 mg total) by mouth 2 (two) times daily with a meal. 60 tablet 0   furosemide (LASIX) 40 MG tablet Take 1 tablet (40 mg total) by mouth daily as needed for fluid. 30 tablet 3   hydrALAZINE (APRESOLINE) 50 MG tablet Take 1 tablet (50 mg total) by mouth 3 (three) times daily. (Patient not taking: No sig reported) 90 tablet 1   losartan (COZAAR) 50 MG tablet Take 1 tablet (50 mg total) by mouth daily. 30 tablet 0   potassium chloride SA (KLOR-CON) 20 MEQ tablet Take 0.5 tablets (10 mEq total) by mouth daily as needed (when taking lasix). 30 tablet 1   No current facility-administered medications for this visit.   Facility-Administered Medications Ordered in Other Visits  Medication Dose Route Frequency Provider Last Rate Last Admin   0.9 %  sodium chloride infusion   Intravenous Continuous Derek Jack, MD       0.9 %  sodium chloride infusion   Intravenous Continuous Derek Jack, MD   Stopped at 09/26/20 1124   0.9 %  sodium chloride infusion   Intravenous Continuous Derek Jack, MD 20 mL/hr at 11/04/20 0934 New Bag at 11/04/20 0934   0.9 %  sodium chloride infusion   Intravenous Continuous Derek Jack, MD 500 mL/hr at 11/04/20 0935 Bolus from Bag at 11/04/20 0935   0.9 %  sodium chloride infusion   Intravenous Continuous Derek Jack, MD   Stopped at 11/07/20 1110   0.9 %  sodium chloride infusion   Intravenous Continuous Derek Jack, MD   Stopped at 12/25/20 1117   0.9 %  sodium chloride infusion   Intravenous Continuous Derek Jack, MD       sodium chloride flush (NS) 0.9 % injection 10 mL  10 mL Intravenous PRN Derek Jack, MD   10 mL at 10/15/20 1035   sodium chloride flush (NS) 0.9 % injection 10 mL  10 mL Intracatheter PRN Derek Jack, MD   10 mL at 11/04/20 0800   sodium chloride flush (NS) 0.9 %  injection 10 mL  10 mL Intravenous PRN Derek Jack, MD   10 mL at 11/07/20 1046    ALLERGIES:  Allergies  Allergen Reactions   Neosporin [Neomycin-Bacitracin Zn-Polymyx] Rash    PHYSICAL EXAM:  Performance status (ECOG): {CHL ONC FF:[id number]}  There were no vitals filed for this visit. Wt Readings from Last 3 Encounters:  08/25/21 182 lb 1.6 oz (82.6 kg)  07/01/21 166 lb 12.8 oz (75.7 kg)  06/19/21  172 lb (78 kg)   Physical Exam   LABORATORY DATA:  I have reviewed the labs as listed.  CBC Latest Ref Rng & Units 08/27/2021 08/26/2021 08/25/2021  WBC 4.0 - 10.5 K/uL 4.5 4.1 -  Hemoglobin 13.0 - 17.0 g/dL 12.2(L) 11.4(L) 10.5(L)  Hematocrit 39.0 - 52.0 % 37.3(L) 34.7(L) 31.0(L)  Platelets 150 - 400 K/uL 160 134(L) -   CMP Latest Ref Rng & Units 08/27/2021 08/26/2021 08/25/2021  Glucose 70 - 99 mg/dL 96 104(H) -  BUN 6 - 20 mg/dL 29(H) 26(H) -  Creatinine 0.61 - 1.24 mg/dL 1.26(H) 1.19 -  Sodium 135 - 145 mmol/L 135 135 138  Potassium 3.5 - 5.1 mmol/L 3.8 3.6 3.8  Chloride 98 - 111 mmol/L 99 101 -  CO2 22 - 32 mmol/L 23 25 -  Calcium 8.9 - 10.3 mg/dL 9.5 8.7(L) -  Total Protein 6.5 - 8.1 g/dL 7.7 6.9 -  Total Bilirubin 0.3 - 1.2 mg/dL 0.3 0.5 -  Alkaline Phos 38 - 126 U/L 82 79 -  AST 15 - 41 U/L 22 22 -  ALT 0 - 44 U/L 20 15 -    DIAGNOSTIC IMAGING:  I have independently reviewed the scans and discussed with the patient. DG Chest 1 View  Result Date: 08/24/2021 CLINICAL DATA:  Confirm existing ETT placement. EXAM: CHEST  1 VIEW COMPARISON:  Chest radiograph 08/24/2021 FINDINGS: The endotracheal tube tip terminates between the thoracic inlet and carina. A nasogastric tube remains in place. Unchanged position of a right chest Port-A-Cath. Improved aeration of the bilateral lungs. There is a persistent opacity in the right base. No pneumothorax. No large pleural effusion. No acute finding in the visualized skeleton. IMPRESSION: 1. Endotracheal tube tip terminates  between the thoracic inlet and carina. 2. Improved aeration of the bilateral lungs with persistent right basilar opacity. Electronically Signed   By: Audie Pinto M.D.   On: 08/24/2021 16:53   DG Abd 1 View  Result Date: 08/24/2021 CLINICAL DATA:  Recent vomiting EXAM: ABDOMEN - 1 VIEW COMPARISON:  None. FINDINGS: Gastric catheter is noted within the stomach. Scattered bowel gas is noted without obstructive pattern. No bony abnormality is seen. IMPRESSION: No acute abnormality noted. Electronically Signed   By: Inez Catalina M.D.   On: 08/24/2021 23:38   DG Abd 1 View  Result Date: 08/24/2021 CLINICAL DATA:  OG tube placement EXAM: ABDOMEN - 1 VIEW COMPARISON:  None. FINDINGS: OG tube appears adequately positioned in the stomach. Paucity of small and large bowel gas within the visualized portion of the abdomen. No evidence of free intraperitoneal air within the visualized portion of the abdomen. IMPRESSION: OG tube adequately positioned in the stomach. Electronically Signed   By: Franki Cabot M.D.   On: 08/24/2021 05:08   DG CHEST PORT 1 VIEW  Result Date: 08/24/2021 CLINICAL DATA:  Recent vomiting EXAM: PORTABLE CHEST 1 VIEW COMPARISON:  08/24/2021 FINDINGS: Cardiac shadow is stable. Endotracheal tube and gastric catheter are noted in satisfactory position. Right-sided chest wall port is noted in satisfactory position. Lungs are well aerated bilaterally with some persistent right basilar opacity stable from the previous exam. No bony abnormality is seen. IMPRESSION: Stable right basilar airspace opacity. Tubes and lines unchanged from the prior study. Electronically Signed   By: Inez Catalina M.D.   On: 08/24/2021 23:32   DG Chest Port 1 View  Result Date: 08/24/2021 CLINICAL DATA:  Shortness of breath EXAM: PORTABLE CHEST 1 VIEW COMPARISON:  07/24/2020 FINDINGS: Cardiac  shadow is enlarged but stable. New right chest wall port is noted in satisfactory position. Lungs are well aerated  bilaterally. Diffuse airspace opacity is noted bilaterally likely related to edema. These changes are new from recent PET-CT from 06/26/2021. No bony abnormality is noted. IMPRESSION: Increase airspace opacity bilaterally likely related to edema. Possibility of lymphomatous involvement deserves consideration as well. These changes are new from prior PET-CT from 06/26/2021. Electronically Signed   By: Inez Catalina M.D.   On: 08/24/2021 02:04   DG Chest Port 1V same Day  Result Date: 08/24/2021 CLINICAL DATA:  Orogastric tube placement EXAM: PORTABLE CHEST 1 VIEW COMPARISON:  08/24/2021 FINDINGS: Lower right lung opacities are unchanged. Unchanged position of right chest wall Port-A-Cath. Orogastric tube tip and side port are below the field of view. No pleural effusion or pneumothorax. IMPRESSION: Orogastric tube tip and side port below the field of view. Electronically Signed   By: Ulyses Jarred M.D.   On: 08/24/2021 03:06   DG Abd Portable 1V  Result Date: 08/24/2021 CLINICAL DATA:  OG tube placement EXAM: PORTABLE ABDOMEN - 1 VIEW COMPARISON:  08/24/2021 FINDINGS: OG tube is in the stomach, unchanged. IMPRESSION: OG tube in the stomach. Electronically Signed   By: Rolm Baptise M.D.   On: 08/24/2021 15:47   MR CARDIAC MORPHOLOGY W WO CONTRAST  Result Date: 08/27/2021 CLINICAL DATA:  Cardiac amyloidosis vs HCM, further testing COMPARISON: Echocardiogram 08/24/21 EXAM: CARDIAC MRI TECHNIQUE: The patient was scanned on a 1.5 Tesla GE magnet. A dedicated cardiac coil was used. Functional imaging was done using Fiesta sequences. 2,3, and 4 chamber views were done to assess for RWMA's. Modified Simpson's rule using a short axis stack was used to calculate an ejection fraction on a dedicated work Conservation officer, nature. The patient received 38mL GADAVIST GADOBUTROL 1 MMOL/ML IV SOLN. After 10 minutes inversion recovery sequences were used to assess for infiltration and scar tissue. This examination is  tailored for evaluation cardiac anatomy and function and provides very limited assessment of noncardiac structures, which are accordingly not evaluated during interpretation. If there is clinical concern for extracardiac pathology, further evaluation with CT imaging should be considered. FINDINGS: LEFT VENTRICLE: Normal left ventricular chamber size. Maximal wall thickness: 31 mm Location: Mid ventricular septum There is no systolic anterior motion of the mitral valve. No flow dephasing to suggests left ventricular outflow tract obstruction. Mitral regurgitation is not seen. No evidence of left ventricular apical aneurysm. 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 Morphologic subtype: Reverse curve Severely reduced LV systolic function. LVEF cannot accurately be calculated but appears 30-35% and appears similar in comparison to echo. Severe global hypokinesis. Elevated T2 signal in the base and mid ventricle. T2 59 msec at basal and mid, and 52 msec at the apex. Normal first pass perfusion. There is post contrast delayed myocardial enhancement. There is diffuse delayed myocardial enhancement in the area of maximal wall thickness, in the anterior, septal and inferior walls from base to mid-apical ventricle. At the mid to apical ventricle the lateral wall also appears involved. LGE appears >15% of the myocardial mass. Normal T1 myocardial nulling kinetics suggest against a diagnosis of cardiac amyloidosis. ECV = 34% RIGHT VENTRICLE: Normal right ventricular chamber size. Normal right ventricular wall thickness. Mildly reduced right ventricular systolic function. Mild global hypokinesis. No post contrast delayed myocardial enhancement. ATRIA: Normal left atrial size. Normal right atrial size. VALVES: No significant valvular abnormalities. Tricuspid aortic valve. PERICARDIUM: Normal pericardium.  Trivial pericardial effusion.  OTHER: No significant extracardiac findings. Please  reference recent PET imaging for full evaluation of patient's known B-cell lymphoma, which is not evaluated on this exam. MEASUREMENTS: Volumes and EF cannot be accurately measured on this study due to technical factors. IMPRESSION: 1. 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 31 mm massive septal hypertrophy in the mid left ventricular septum. Significant diffuse delayed myocardial enhancement appears >15% of myocardial mass. ECV 36%, nonspecific elevated. T2 is 59 msec in area of maximal wall thickness. Findings of >30 mm wall thickness and elevated T2 signal suggest higher risk phenotype HCM. 2. Severely reduced LV systolic function by visual estimate, LVEF 30-35% with global hypokinesis. 2.  Mildly reduced RV function with normal RV size. 4. ECV value and inversion time myocardial nulling kinetics suggest against a diagnosis of cardiac amyloidosis. Electronically Signed   By: Cherlynn Kaiser M.D.   On: 08/27/2021 11:11   ECHOCARDIOGRAM COMPLETE  Result Date: 08/24/2021    ECHOCARDIOGRAM REPORT   Patient Name:   Richard Cox Date of Exam: 08/24/2021 Medical Rec #:  [id number]  Height:       73.0 in Accession #:    [id number] Weight:       167.5 lb Date of Birth:  26-Oct-1981   BSA:          1.996 m Patient Age:    70 years   BP:           141/93 mmHg Patient Gender: M          HR:           89 bpm. Exam Location:  [facility] Procedure: 2D Echo, Color Doppler and Cardiac Doppler Indications:    CHF  History:        Patient has prior history of Echocardiogram examinations, most                 recent 11/18/2020. CHF; Risk Factors:Hypertension and Current                 Smoker. HIV, Substance Abuse.  Sonographer:    Leavy Cella RDCS Referring Phys: [id number] ASIA B V[place]  1. There is severe asymmetric hypertrophy of the septum up to 29 mm on this study. The PW is severely hypertrophied up to 20 mm. There is no SAM of the mitral  valve. There is no LVOT obstruction. Findings could represent hypertrophic cardiomyopathy versus other infiltrative process such as cardiac amyloidosis. Would recommend cardiac MRI for clarification. EF appears to be mildly reduced on this study, EF 45-50%. Left ventricular ejection fraction, by estimation, is 45 to 50%. The left ventricle has mildly decreased function. The left ventricle demonstrates global hypokinesis. There is severe asymmetric left ventricular hypertrophy of the septal segment. Left ventricular diastolic parameters are consistent with Grade II diastolic dysfunction (pseudonormalization).  2. Right ventricular systolic function is normal. The right ventricular size is normal. Tricuspid regurgitation signal is inadequate for assessing PA pressure.  3. Left atrial size was moderately dilated.  4. The mitral valve is grossly normal. No evidence of mitral valve regurgitation. No evidence of mitral stenosis.  5. The aortic valve is tricuspid. Aortic valve regurgitation is not visualized. No aortic stenosis is present. Comparison(s): Changes from prior study are noted. Severe asymmetric hypertrophy remains. EF 45-50% on this study. FINDINGS  Left Ventricle: There is severe asymmetric hypertrophy of the septum up to 29 mm on this study. The PW is severely hypertrophied up to 20 mm.  There is no SAM of the mitral valve. There is no LVOT obstruction. Findings could represent hypertrophic cardiomyopathy versus other infiltrative process such as cardiac amyloidosis. Would recommend cardiac MRI for clarification. EF appears to be mildly reduced on this study, EF 45-50%. Left ventricular ejection fraction, by estimation, is 45 to 50%. The left ventricle has mildly decreased function. The left ventricle demonstrates global hypokinesis. The left ventricular internal cavity size was small. There is severe asymmetric left ventricular hypertrophy of the septal segment. Left ventricular diastolic parameters are  consistent with Grade II diastolic dysfunction (pseudonormalization). Right Ventricle: The right ventricular size is normal. No increase in right ventricular wall thickness. Right ventricular systolic function is normal. Tricuspid regurgitation signal is inadequate for assessing PA pressure. Left Atrium: Left atrial size was moderately dilated. Right Atrium: Right atrial size was normal in size. Pericardium: Trivial pericardial effusion is present. Mitral Valve: The mitral valve is grossly normal. No evidence of mitral valve regurgitation. No evidence of mitral valve stenosis. Tricuspid Valve: The tricuspid valve is grossly normal. Tricuspid valve regurgitation is trivial. No evidence of tricuspid stenosis. Aortic Valve: The aortic valve is tricuspid. Aortic valve regurgitation is not visualized. No aortic stenosis is present. Pulmonic Valve: The pulmonic valve was grossly normal. Pulmonic valve regurgitation is not visualized. No evidence of pulmonic stenosis. Aorta: The aortic root and ascending aorta are structurally normal, with no evidence of dilitation. Venous: IVC assessment for right atrial pressure unable to be performed due to mechanical ventilation. IAS/Shunts: The atrial septum is grossly normal.  LEFT VENTRICLE PLAX 2D LVIDd:         4.20 cm   Diastology LVIDs:         3.30 cm   LV e' medial:    3.44 cm/s LV PW:         2.50 cm   LV E/e' medial:  16.3 LV IVS:        2.90 cm   LV e' lateral:   4.25 cm/s LVOT diam:     2.50 cm   LV E/e' lateral: 13.2 LV SV:         60 LV SV Index:   30 LVOT Area:     4.91 cm  RIGHT VENTRICLE RV Basal diam:  3.90 cm RV Mid diam:    3.40 cm RV S prime:     13.60 cm/s TAPSE (M-mode): 2.0 cm LEFT ATRIUM           Index        RIGHT ATRIUM           Index LA diam:      3.80 cm 1.90 cm/m   RA Area:     15.80 cm LA Vol (A2C): 46.1 ml 23.10 ml/m  RA Volume:   43.10 ml  21.60 ml/m LA Vol (A4C): 94.8 ml 47.50 ml/m  AORTIC VALVE LVOT Vmax:   85.30 cm/s LVOT Vmean:  50.800 cm/s  LVOT VTI:    0.123 m  AORTA Ao Root diam: 3.40 cm MITRAL VALVE MV Area (PHT): 4.89 cm    SHUNTS MV Decel Time: 155 msec    Systemic VTI:  0.12 m MV E velocity: 56.00 cm/s  Systemic Diam: 2.50 cm MV A velocity: 44.90 cm/s MV E/A ratio:  1.25 Eleonore Chiquito MD Electronically signed by Eleonore Chiquito MD Signature Date/Time: 08/24/2021/3:07:16 PM    Final      ASSESSMENT:  ***   PLAN:  ***   Orders placed this encounter:  No  orders of the defined types were placed in this encounter.    Derek Jack, MD [facility] [phone]   I, ***, am acting as a scribe for Dr. Derek Jack.  {Add Barista Statement}

## 2021-09-03 ENCOUNTER — Ambulatory Visit (HOSPITAL_COMMUNITY): Payer: 59 | Admitting: Hematology

## 2021-09-10 ENCOUNTER — Encounter: Payer: Self-pay | Admitting: Internal Medicine

## 2021-09-10 ENCOUNTER — Other Ambulatory Visit: Payer: Self-pay

## 2021-09-10 ENCOUNTER — Ambulatory Visit (INDEPENDENT_AMBULATORY_CARE_PROVIDER_SITE_OTHER): Payer: 59 | Admitting: Internal Medicine

## 2021-09-10 VITALS — BP 162/98 | HR 76 | Temp 98.4°F | Resp 16 | Ht 73.0 in | Wt 170.1 lb

## 2021-09-10 DIAGNOSIS — I5042 Chronic combined systolic (congestive) and diastolic (congestive) heart failure: Secondary | ICD-10-CM | POA: Diagnosis not present

## 2021-09-10 DIAGNOSIS — I10 Essential (primary) hypertension: Secondary | ICD-10-CM

## 2021-09-10 DIAGNOSIS — L0292 Furuncle, unspecified: Secondary | ICD-10-CM

## 2021-09-10 DIAGNOSIS — Z09 Encounter for follow-up examination after completed treatment for conditions other than malignant neoplasm: Secondary | ICD-10-CM | POA: Diagnosis not present

## 2021-09-10 DIAGNOSIS — F1910 Other psychoactive substance abuse, uncomplicated: Secondary | ICD-10-CM

## 2021-09-10 MED ORDER — LOSARTAN POTASSIUM 50 MG PO TABS: 50.0000 mg | ORAL_TABLET | Freq: Every day | ORAL | 0 refills | Status: DC

## 2021-09-10 MED ORDER — DOXYCYCLINE HYCLATE 100 MG PO TABS: 100.0000 mg | ORAL_TABLET | Freq: Two times a day (BID) | ORAL | 0 refills | Status: DC

## 2021-09-10 MED ORDER — CARVEDILOL 12.5 MG PO TABS: 12.5000 mg | ORAL_TABLET | Freq: Two times a day (BID) | ORAL | 0 refills | Status: DC

## 2021-09-10 NOTE — Assessment & Plan Note (Signed)
BP Readings from Last 1 Encounters:  09/10/21 (!) 162/98   Mentions that he ran out of Hydralazine Noncompliant with medications Continue Losartan considering h/o HFrEF Continue Coreg and Lasix for CHF Advised for low-salt diet

## 2021-09-10 NOTE — Assessment & Plan Note (Signed)
UDS showed cocaine in the hospital Advised to abstain from substance use

## 2021-09-10 NOTE — Progress Notes (Signed)
Established Patient Office Visit  Subjective:  Patient ID: Richard Cox, male    DOB: Nov 08, 1981  Age: 40 y.o. MRN: [id number]  CC:  Chief Complaint  Patient presents with   Transitions Of Care    TOC pt was discharged 08-27-21 CHF Exacerbation has been feeling better since being home bp is still high unsure if meds need to be changed     HPI Richard Cox is a 40 y.o. male with past medical history of HIV, chronic combined systolic and diastolic heart failure, hypertension and B-cell lymphoma (completed chemotherapy) who presents for follow up after recent hospital admission for CHF exacerbation, substance abuse and sepsis 2/2 to aspiration pneumonia.  He was admitted from 10/16-10/19 to [facility].  He had acute hypoxic respiratory failure secondary to aspiration pneumonia, and required intubation and IV antibiotics.  He was found to have cocaine in his UDS.  He also had flash pulmonary edema due to CHF exacerbation.  He has been doing well since getting discharged from the hospital.  Denies any fever, chills, dyspnea or leg swelling currently.  His BP was elevated in the office today.  Of note, he has not been taking hydralazine, but his going to get it from the pharmacy today.  He continues to take losartan and Coreg.  Denies any headache, dizziness, chest pain, dyspnea or palpitations.  He reports having a painful bump over the backside of her right thigh, which developed after he completed Augmentin.  Denies any recent injury or insect bite.  Past Medical History:  Diagnosis Date   Acute respiratory failure with hypoxia ([place]) 08/24/2021   CHF (congestive heart failure) (HCC)    Endotracheally intubated 08/24/2021   Essential hypertension    HIV infection ([place])    Lymphoma ([place]) 08/28/2020   Noncompliance with medication regimen    Secondary cardiomyopathy ([place])     Past Surgical History:  Procedure Laterality Date   IR IMAGING GUIDED PORT  INSERTION  08/23/2020   Right   NO PAST SURGERIES      Family History  Problem Relation Age of Onset   Diabetes Mellitus II Mother    Cancer Mother        multiple myeloma   Cancer Father        multiple myeloma   Congestive Heart Failure Brother    Heart Problems Brother    Lupus Brother     Social History   Socioeconomic History   Marital status: Single    Spouse name: Not on file   Number of children: 0   Years of education: 13   Highest education level: Not on file  Occupational History   Not on file  Tobacco Use   Smoking status: Light Smoker    Packs/day: 0.25    Types: Cigarettes   Smokeless tobacco: Never  Vaping Use   Vaping Use: Never used  Substance and Sexual Activity   Alcohol use: Not Currently   Drug use: Not Currently    Types: Marijuana    Comment: x 1 week   Sexual activity: Not Currently    Comment: pt given condoms  Other Topics Concern   Not on file  Social History Narrative   Not on file   Social Determinants of Health   Financial Resource Strain: Low Risk    Difficulty of Paying Living Expenses: Not very hard  Food Insecurity: No Food Insecurity   Worried About Running Out of Food in the Last Year:  Never true   Ran Out of Food in the Last Year: Never true  Transportation Needs: No Transportation Needs   Lack of Transportation (Medical): No   Lack of Transportation (Non-Medical): No  Physical Activity: Insufficiently Active   Days of Exercise per Week: 5 days   Minutes of Exercise per Session: 20 min  Stress: No Stress Concern Present   Feeling of Stress : Only a little  Social Connections: Socially Isolated   Frequency of Communication with Friends and Family: More than three times a week   Frequency of Social Gatherings with Friends and Family: Never   Attends Religious Services: Never   Printmaker: No   Attends Music therapist: Not on file   Marital Status: Never married  Arboriculturist Violence: Not At Risk   Fear of Current or Ex-Partner: No   Emotionally Abused: No   Physically Abused: No   Sexually Abused: No    Outpatient Medications Prior to Visit  Medication Sig Dispense Refill   acetaminophen (TYLENOL) 500 MG tablet Take 1,000 mg by mouth every 6 (six) hours as needed for moderate pain or headache.     aspirin 81 MG chewable tablet Chew 1 tablet (81 mg total) by mouth daily. 30 tablet 0   bictegravir-emtricitabine-tenofovir AF (BIKTARVY) 50-200-25 MG TABS tablet TAKE 1 TABLET BY MOUTH 1 TIME A DAY. (Patient taking differently: Take 1 tablet by mouth daily.) 30 tablet 5   furosemide (LASIX) 40 MG tablet Take 1 tablet (40 mg total) by mouth daily as needed for fluid. 30 tablet 3   hydrALAZINE (APRESOLINE) 50 MG tablet Take 1 tablet (50 mg total) by mouth 3 (three) times daily. 90 tablet 1   potassium chloride SA (KLOR-CON) 20 MEQ tablet Take 0.5 tablets (10 mEq total) by mouth daily as needed (when taking lasix). 30 tablet 1   carvedilol (COREG) 12.5 MG tablet Take 1 tablet (12.5 mg total) by mouth 2 (two) times daily with a meal. 60 tablet 0   losartan (COZAAR) 50 MG tablet Take 1 tablet (50 mg total) by mouth daily. 30 tablet 0   amoxicillin-clavulanate (AUGMENTIN) 875-125 MG tablet Take 1 tablet by mouth every 12 (twelve) hours. (Patient not taking: Reported on 09/10/2021) 6 tablet 0   Facility-Administered Medications Prior to Visit  Medication Dose Route Frequency Provider Last Rate Last Admin   0.9 %  sodium chloride infusion   Intravenous Continuous Derek Jack, MD       0.9 %  sodium chloride infusion   Intravenous Continuous Derek Jack, MD   Stopped at 09/26/20 1124   0.9 %  sodium chloride infusion   Intravenous Continuous Derek Jack, MD 20 mL/hr at 11/04/20 0934 New Bag at 11/04/20 0934   0.9 %  sodium chloride infusion   Intravenous Continuous Derek Jack, MD 500 mL/hr at 11/04/20 0935 Bolus from Bag at 11/04/20  0935   0.9 %  sodium chloride infusion   Intravenous Continuous Derek Jack, MD   Stopped at 11/07/20 1110   0.9 %  sodium chloride infusion   Intravenous Continuous Derek Jack, MD   Stopped at 12/25/20 1117   0.9 %  sodium chloride infusion   Intravenous Continuous Derek Jack, MD       sodium chloride flush (NS) 0.9 % injection 10 mL  10 mL Intravenous PRN Derek Jack, MD   10 mL at 10/15/20 1035   sodium chloride flush (NS) 0.9 % injection 10 mL  10 mL Intracatheter PRN Derek Jack, MD   10 mL at 11/04/20 0800   sodium chloride flush (NS) 0.9 % injection 10 mL  10 mL Intravenous PRN Derek Jack, MD   10 mL at 11/07/20 1046    Allergies  Allergen Reactions   Neosporin [Neomycin-Bacitracin Zn-Polymyx] Rash    ROS Review of Systems  Constitutional:  Positive for fatigue. Negative for chills and fever.  HENT:  Negative for congestion, sinus pressure and sinus pain.   Respiratory:  Negative for cough and shortness of breath.   Cardiovascular:  Negative for chest pain and palpitations.  Gastrointestinal:  Negative for diarrhea, nausea and vomiting.  Genitourinary:  Negative for dysuria and hematuria.  Musculoskeletal:  Negative for neck pain and neck stiffness.  Skin:  Negative for rash.  Neurological:  Negative for dizziness and weakness.  Psychiatric/Behavioral:  Negative for agitation and behavioral problems.      Objective:    Physical Exam Vitals reviewed.  Constitutional:      General: He is not in acute distress.    Appearance: He is not diaphoretic.  HENT:     Head: Normocephalic and atraumatic.     Nose: Nose normal.     Mouth/Throat:     Mouth: Mucous membranes are moist.  Eyes:     General: No scleral icterus.    Extraocular Movements: Extraocular movements intact.     Pupils: Pupils are equal, round, and reactive to light.  Cardiovascular:     Rate and Rhythm: Normal rate and regular rhythm.     Pulses:  Normal pulses.     Heart sounds: No murmur heard. Pulmonary:     Breath sounds: Normal breath sounds. No wheezing or rales.  Abdominal:     General: There is no distension.     Palpations: Abdomen is soft.     Tenderness: There is no guarding or rebound.  Musculoskeletal:     Cervical back: Neck supple. No tenderness.     Right lower leg: No edema.     Left lower leg: No edema.  Skin:    General: Skin is warm.     Comments: Abscess about 0.5 cm in diameter over right thigh  Neurological:     General: No focal deficit present.     Mental Status: He is alert and oriented to person, place, and time.     Sensory: No sensory deficit.     Motor: No weakness.  Psychiatric:        Mood and Affect: Mood normal.        Behavior: Behavior normal.    BP (!) 162/98 (BP Location: Left Arm, Cuff Size: Normal)   Pulse 76   Temp 98.4 F (36.9 C) (Oral)   Resp 16   Ht '6\' 1"'  (1.854 m)   Wt 170 lb 1.3 oz (77.1 kg)   SpO2 96%   BMI 22.44 kg/m  Wt Readings from Last 3 Encounters:  09/10/21 170 lb 1.3 oz (77.1 kg)  08/25/21 182 lb 1.6 oz (82.6 kg)  07/01/21 166 lb 12.8 oz (75.7 kg)    Lab Results  Component Value Date   TSH 0.786 08/24/2021   Lab Results  Component Value Date   WBC 4.5 08/27/2021   HGB 12.2 (L) 08/27/2021   HCT 37.3 (L) 08/27/2021   MCV 89.4 08/27/2021   PLT 160 08/27/2021   Lab Results  Component Value Date   NA 135 08/27/2021   K 3.8 08/27/2021   CO2 23  08/27/2021   GLUCOSE 96 08/27/2021   BUN 29 (H) 08/27/2021   CREATININE 1.26 (H) 08/27/2021   BILITOT 0.3 08/27/2021   ALKPHOS 82 08/27/2021   AST 22 08/27/2021   ALT 20 08/27/2021   PROT 7.7 08/27/2021   ALBUMIN 3.4 (L) 08/27/2021   CALCIUM 9.5 08/27/2021   ANIONGAP 13 08/27/2021   Lab Results  Component Value Date   CHOL 183 08/25/2021   Lab Results  Component Value Date   HDL 25 (L) 08/25/2021   Lab Results  Component Value Date   LDLCALC 134 (H) 08/25/2021   Lab Results  Component  Value Date   TRIG 120 08/25/2021   TRIG 121 08/25/2021   Lab Results  Component Value Date   CHOLHDL 7.3 08/25/2021   Lab Results  Component Value Date   HGBA1C 5.1 08/25/2021      Assessment & Plan:   Problem List Items Addressed This Visit       Hospital discharge follow-up - [facility] chart reviewed, including discharge summary Acute respiratory failure, sepsis 2/2 to aspiration pneumonia and CHF exacerbation Check CBC and CMP Needs to follow up with Cardiology     Relevant Orders  CMP14+EGFR  CBC with Differential/Platelet    Cardiovascular and Mediastinum   HTN (hypertension)    BP Readings from Last 1 Encounters:  09/10/21 (!) 162/98  Mentions that he ran out of Hydralazine Noncompliant with medications Continue Losartan considering h/o HFrEF Continue Coreg and Lasix for CHF Advised for low-salt diet      Relevant Medications   losartan (COZAAR) 50 MG tablet   carvedilol (COREG) 12.5 MG tablet   Congestive heart failure (HCC)    Recent CHF exacerbation Noncompliant to medications On Losartan, Coreg, Hydralazine and Lasix Needs to follow up with Cardiology      Relevant Medications   losartan (COZAAR) 50 MG tablet   carvedilol (COREG) 12.5 MG tablet   Chronic combined systolic and diastolic CHF (congestive heart failure) (HCC)   Relevant Medications   losartan (COZAAR) 50 MG tablet   carvedilol (COREG) 12.5 MG tablet     Other   Substance abuse (HCC)    UDS showed cocaine in the hospital Advised to abstain from substance use      Other Visit Diagnoses     Furuncle       Relevant Medications   doxycycline (VIBRA-TABS) 100 MG tablet       Meds ordered this encounter  Medications   losartan (COZAAR) 50 MG tablet    Sig: Take 1 tablet (50 mg total) by mouth daily.    Dispense:  30 tablet    Refill:  0   carvedilol (COREG) 12.5 MG tablet    Sig: Take 1 tablet (12.5 mg total) by mouth 2 (two) times daily with a meal.     Dispense:  60 tablet    Refill:  0   doxycycline (VIBRA-TABS) 100 MG tablet    Sig: Take 1 tablet (100 mg total) by mouth 2 (two) times daily.    Dispense:  14 tablet    Refill:  0    Follow-up: Return in about 3 weeks (around 10/01/2021) for HTN.    Lindell Spar, MD

## 2021-09-10 NOTE — Patient Instructions (Addendum)
Please continue taking medications as prescribed.  Please call to schedule appointment to follow up with Dr Domenic Polite.

## 2021-09-10 NOTE — Assessment & Plan Note (Signed)
Recent CHF exacerbation Noncompliant to medications On Losartan, Coreg, Hydralazine and Lasix Needs to follow up with Cardiology

## 2021-09-10 NOTE — Assessment & Plan Note (Signed)
Hospital chart reviewed, including discharge summary Acute respiratory failure, sepsis 2/2 to aspiration pneumonia and CHF exacerbation Check CBC and CMP Needs to follow up with Cardiology

## 2021-09-10 NOTE — Progress Notes (Signed)
Last 2 visits notes and medication list faxed to Gershon Cull, RN, BSN, CM from [facility].  Phone [phone], Fax [phone]

## 2021-09-11 ENCOUNTER — Encounter (HOSPITAL_COMMUNITY): Payer: Self-pay | Admitting: Hematology

## 2021-09-12 ENCOUNTER — Encounter (HOSPITAL_COMMUNITY): Payer: Self-pay | Admitting: Hematology

## 2021-09-12 LAB — CBC WITH DIFFERENTIAL/PLATELET
Basophils Absolute: 0 10*3/uL (ref 0.0–0.2)
Basos: 0 %
EOS (ABSOLUTE): 0.1 10*3/uL (ref 0.0–0.4)
Eos: 2 %
Hematocrit: 35 % — ABNORMAL LOW (ref 37.5–51.0)
Hemoglobin: 12 g/dL — ABNORMAL LOW (ref 13.0–17.7)
Immature Grans (Abs): 0 10*3/uL (ref 0.0–0.1)
Immature Granulocytes: 0 %
Lymphocytes Absolute: 1.2 10*3/uL (ref 0.7–3.1)
Lymphs: 37 %
MCH: 30.1 pg (ref 26.6–33.0)
MCHC: 34.3 g/dL (ref 31.5–35.7)
MCV: 88 fL (ref 79–97)
Monocytes Absolute: 0.3 10*3/uL (ref 0.1–0.9)
Monocytes: 8 %
Neutrophils Absolute: 1.7 10*3/uL (ref 1.4–7.0)
Neutrophils: 53 %
Platelets: 141 10*3/uL — ABNORMAL LOW (ref 150–450)
RBC: 3.99 x10E6/uL — ABNORMAL LOW (ref 4.14–5.80)
RDW: 15.7 % — ABNORMAL HIGH (ref 11.6–15.4)
WBC: 3.1 10*3/uL — ABNORMAL LOW (ref 3.4–10.8)

## 2021-09-12 LAB — CMP14+EGFR
ALT: 15 IU/L (ref 0–44)
AST: 24 IU/L (ref 0–40)
Albumin/Globulin Ratio: 1.7 (ref 1.2–2.2)
Albumin: 4.4 g/dL (ref 4.0–5.0)
Alkaline Phosphatase: 88 IU/L (ref 44–121)
BUN/Creatinine Ratio: 13 (ref 9–20)
BUN: 22 mg/dL — ABNORMAL HIGH (ref 6–20)
Bilirubin Total: <0.2 mg/dL (ref 0.0–1.2)
CO2: 23 mmol/L (ref 20–29)
Calcium: 9.5 mg/dL (ref 8.7–10.2)
Chloride: 107 mmol/L — ABNORMAL HIGH (ref 96–106)
Creatinine, Ser: 1.73 mg/dL — ABNORMAL HIGH (ref 0.76–1.27)
Globulin, Total: 2.6 g/dL (ref 1.5–4.5)
Glucose: 72 mg/dL (ref 70–99)
Potassium: 5 mmol/L (ref 3.5–5.2)
Sodium: 147 mmol/L — ABNORMAL HIGH (ref 134–144)
Total Protein: 7 g/dL (ref 6.0–8.5)
eGFR: 51 mL/min/{1.73_m2} — ABNORMAL LOW (ref >59)

## 2021-10-06 ENCOUNTER — Ambulatory Visit (INDEPENDENT_AMBULATORY_CARE_PROVIDER_SITE_OTHER): Payer: 59 | Admitting: Internal Medicine

## 2021-10-06 ENCOUNTER — Other Ambulatory Visit: Payer: Self-pay

## 2021-10-06 ENCOUNTER — Encounter: Payer: Self-pay | Admitting: Internal Medicine

## 2021-10-06 ENCOUNTER — Telehealth: Payer: Self-pay

## 2021-10-06 VITALS — BP 144/98 | HR 90 | Temp 98.5°F | Resp 18 | Ht 73.0 in | Wt 165.1 lb

## 2021-10-06 DIAGNOSIS — F5101 Primary insomnia: Secondary | ICD-10-CM | POA: Diagnosis not present

## 2021-10-06 DIAGNOSIS — I10 Essential (primary) hypertension: Secondary | ICD-10-CM

## 2021-10-06 DIAGNOSIS — I5042 Chronic combined systolic (congestive) and diastolic (congestive) heart failure: Secondary | ICD-10-CM

## 2021-10-06 DIAGNOSIS — J209 Acute bronchitis, unspecified: Secondary | ICD-10-CM | POA: Diagnosis not present

## 2021-10-06 LAB — POCT INFLUENZA A/B
Influenza A, POC: NEGATIVE
Influenza B, POC: NEGATIVE

## 2021-10-06 MED ORDER — CARVEDILOL 12.5 MG PO TABS
12.5000 mg | ORAL_TABLET | Freq: Two times a day (BID) | ORAL | 1 refills | Status: DC
Start: 2021-10-06 — End: 2021-12-31

## 2021-10-06 MED ORDER — HYDROXYZINE HCL 25 MG PO TABS: 25.0000 mg | ORAL_TABLET | Freq: Every evening | ORAL | 0 refills | Status: DC | PRN

## 2021-10-06 MED ORDER — LOSARTAN POTASSIUM 50 MG PO TABS: 50.0000 mg | ORAL_TABLET | Freq: Every day | ORAL | 1 refills | Status: DC

## 2021-10-06 MED ORDER — AZITHROMYCIN 250 MG PO TABS: ORAL_TABLET | ORAL | 0 refills | Status: AC

## 2021-10-06 MED ORDER — HYDRALAZINE HCL 50 MG PO TABS: 50.0000 mg | ORAL_TABLET | Freq: Three times a day (TID) | ORAL | 5 refills | Status: DC

## 2021-10-06 NOTE — Telephone Encounter (Signed)
Disability forms  Copied Noted Sleeved

## 2021-10-06 NOTE — Assessment & Plan Note (Signed)
BP Readings from Last 1 Encounters:  10/06/21 (!) 144/98   Uncontrolled, but better than previous visit Mentions that he ran out of Hydralazine, refills provided Noncompliant with medications Continue Losartan considering h/o HFrEF Continue Coreg and Lasix for CHF Advised for low-salt diet

## 2021-10-06 NOTE — Patient Instructions (Signed)
Please continue taking medications as prescribed.  Please continue to refrain from smoking and any other substance abuse.

## 2021-10-06 NOTE — Progress Notes (Signed)
Established Patient Office Visit  Subjective:  Patient ID: Richard Cox, male    DOB: Mar 01, 1981  Age: 40 y.o. MRN: [id number]  CC:  Chief Complaint  Patient presents with   Follow-up    3 week follow up HTN pt feeling nauseas light headed chills had cough this started 10-03-21    HPI Richard Cox is a 40 y.o. male with past medical history of  HIV, chronic combined systolic and diastolic heart failure, hypertension and B-cell lymphoma (completed chemotherapy) who presents for f/u of chronic medical conditions.  He complains of cough and chills with nausea and lightheadedness for last 3 days.  He denies any fever, diarrhea, dyspnea or wheezing currently.  He denies any recent smoking or substance abuse.  His brother passed away yesterday, but he does not know whether he was sick.  Considering his history of HIV, we will get flu and COVID test today.  His BP was elevated today in the office today.  Of note, he has run out of hydralazine again and states that he did not have transportation to pick up medicine from pharmacy.  Past Medical History:  Diagnosis Date   Acute respiratory failure with hypoxia ([place]) 08/24/2021   CHF (congestive heart failure) (HCC)    Endotracheally intubated 08/24/2021   Essential hypertension    HIV infection ([place])    Lymphoma ([place]) 08/28/2020   Noncompliance with medication regimen    Secondary cardiomyopathy ([place])     Past Surgical History:  Procedure Laterality Date   IR IMAGING GUIDED PORT INSERTION  08/23/2020   Right   NO PAST SURGERIES      Family History  Problem Relation Age of Onset   Diabetes Mellitus II Mother    Cancer Mother        multiple myeloma   Cancer Father        multiple myeloma   Congestive Heart Failure Brother    Heart Problems Brother    Lupus Brother     Social History   Socioeconomic History   Marital status: Single    Spouse name: Not on file   Number of children: 0   Years of education: 13   Highest  education level: Not on file  Occupational History   Not on file  Tobacco Use   Smoking status: Light Smoker    Packs/day: 0.25    Types: Cigarettes   Smokeless tobacco: Never  Vaping Use   Vaping Use: Never used  Substance and Sexual Activity   Alcohol use: Not Currently   Drug use: Not Currently    Types: Marijuana    Comment: x 1 week   Sexual activity: Not Currently    Comment: pt given condoms  Other Topics Concern   Not on file  Social History Narrative   Not on file   Social Determinants of Health   Financial Resource Strain: Not on file  Food Insecurity: Not on file  Transportation Needs: Not on file  Physical Activity: Not on file  Stress: Not on file  Social Connections: Not on file  Intimate Partner Violence: Not At Risk   Fear of Current or Ex-Partner: No   Emotionally Abused: No   Physically Abused: No   Sexually Abused: No    Outpatient Medications Prior to Visit  Medication Sig Dispense Refill   acetaminophen (TYLENOL) 500 MG tablet Take 1,000 mg by mouth every 6 (six) hours as needed for moderate pain or headache.     aspirin 81 MG  chewable tablet Chew 1 tablet (81 mg total) by mouth daily. 30 tablet 0   bictegravir-emtricitabine-tenofovir AF (BIKTARVY) 50-200-25 MG TABS tablet TAKE 1 TABLET BY MOUTH 1 TIME A DAY. (Patient taking differently: Take 1 tablet by mouth daily.) 30 tablet 5   furosemide (LASIX) 40 MG tablet Take 1 tablet (40 mg total) by mouth daily as needed for fluid. 30 tablet 3   potassium chloride SA (KLOR-CON) 20 MEQ tablet Take 0.5 tablets (10 mEq total) by mouth daily as needed (when taking lasix). 30 tablet 1   carvedilol (COREG) 12.5 MG tablet Take 1 tablet (12.5 mg total) by mouth 2 (two) times daily with a meal. 60 tablet 0   doxycycline (VIBRA-TABS) 100 MG tablet Take 1 tablet (100 mg total) by mouth 2 (two) times daily. 14 tablet 0   hydrALAZINE (APRESOLINE) 50 MG tablet Take 1 tablet (50 mg total) by mouth 3 (three) times daily.  90 tablet 1   losartan (COZAAR) 50 MG tablet Take 1 tablet (50 mg total) by mouth daily. 30 tablet 0   Facility-Administered Medications Prior to Visit  Medication Dose Route Frequency Provider Last Rate Last Admin   0.9 %  sodium chloride infusion   Intravenous Continuous Derek Jack, MD       0.9 %  sodium chloride infusion   Intravenous Continuous Derek Jack, MD   Stopped at 09/26/20 1124   0.9 %  sodium chloride infusion   Intravenous Continuous Derek Jack, MD 20 mL/hr at 11/04/20 0934 New Bag at 11/04/20 0934   0.9 %  sodium chloride infusion   Intravenous Continuous Derek Jack, MD 500 mL/hr at 11/04/20 0935 Bolus from Bag at 11/04/20 0935   0.9 %  sodium chloride infusion   Intravenous Continuous Derek Jack, MD   Stopped at 11/07/20 1110   0.9 %  sodium chloride infusion   Intravenous Continuous Derek Jack, MD   Stopped at 12/25/20 1117   0.9 %  sodium chloride infusion   Intravenous Continuous Derek Jack, MD       sodium chloride flush (NS) 0.9 % injection 10 mL  10 mL Intravenous PRN Derek Jack, MD   10 mL at 10/15/20 1035   sodium chloride flush (NS) 0.9 % injection 10 mL  10 mL Intracatheter PRN Derek Jack, MD   10 mL at 11/04/20 0800   sodium chloride flush (NS) 0.9 % injection 10 mL  10 mL Intravenous PRN Derek Jack, MD   10 mL at 11/07/20 1046    Allergies  Allergen Reactions   Neosporin [Neomycin-Bacitracin Zn-Polymyx] Rash    ROS Review of Systems  Constitutional:  Positive for chills and fatigue. Negative for fever.  HENT:  Negative for congestion, sinus pressure and sinus pain.   Respiratory:  Positive for cough. Negative for shortness of breath.   Cardiovascular:  Negative for chest pain and palpitations.  Gastrointestinal:  Positive for nausea. Negative for diarrhea and vomiting.  Genitourinary:  Negative for dysuria and hematuria.  Musculoskeletal:  Negative for neck  pain and neck stiffness.  Skin:  Negative for rash.  Neurological:  Positive for light-headedness. Negative for weakness.  Psychiatric/Behavioral:  Negative for agitation and behavioral problems.      Objective:    Physical Exam Vitals reviewed.  Constitutional:      General: He is not in acute distress.    Appearance: He is not diaphoretic.  HENT:     Head: Normocephalic and atraumatic.     Nose: Nose normal.  Mouth/Throat:     Mouth: Mucous membranes are moist.  Eyes:     General: No scleral icterus.    Extraocular Movements: Extraocular movements intact.  Cardiovascular:     Rate and Rhythm: Normal rate and regular rhythm.     Pulses: Normal pulses.     Heart sounds: Normal heart sounds. No murmur heard. Pulmonary:     Breath sounds: Normal breath sounds. No wheezing or rales.  Abdominal:     General: There is no distension.     Palpations: Abdomen is soft.     Tenderness: There is no guarding or rebound.  Musculoskeletal:     Cervical back: Neck supple. No tenderness.     Right lower leg: No edema.     Left lower leg: No edema.  Skin:    General: Skin is warm.  Neurological:     General: No focal deficit present.     Mental Status: He is alert and oriented to person, place, and time.     Sensory: No sensory deficit.     Motor: No weakness.  Psychiatric:        Mood and Affect: Mood normal.        Behavior: Behavior normal.    BP (!) 144/98 (BP Location: Left Arm, Patient Position: Sitting, Cuff Size: Normal)   Pulse 90   Temp 98.5 F (36.9 C) (Temporal)   Resp 18   Ht '6\' 1"'  (1.854 m)   Wt 165 lb 1.9 oz (74.9 kg)   SpO2 95%   BMI 21.78 kg/m  Wt Readings from Last 3 Encounters:  10/06/21 165 lb 1.9 oz (74.9 kg)  09/10/21 170 lb 1.3 oz (77.1 kg)  08/25/21 182 lb 1.6 oz (82.6 kg)    Lab Results  Component Value Date   TSH 0.786 08/24/2021   Lab Results  Component Value Date   WBC 3.1 (L) 09/10/2021   HGB 12.0 (L) 09/10/2021   HCT 35.0 (L)  09/10/2021   MCV 88 09/10/2021   PLT 141 (L) 09/10/2021   Lab Results  Component Value Date   NA 147 (H) 09/10/2021   K 5.0 09/10/2021   CO2 23 09/10/2021   GLUCOSE 72 09/10/2021   BUN 22 (H) 09/10/2021   CREATININE 1.73 (H) 09/10/2021   BILITOT <0.2 09/10/2021   ALKPHOS 88 09/10/2021   AST 24 09/10/2021   ALT 15 09/10/2021   PROT 7.0 09/10/2021   ALBUMIN 4.4 09/10/2021   CALCIUM 9.5 09/10/2021   ANIONGAP 13 08/27/2021   EGFR 51 (L) 09/10/2021   Lab Results  Component Value Date   CHOL 183 08/25/2021   Lab Results  Component Value Date   HDL 25 (L) 08/25/2021   Lab Results  Component Value Date   LDLCALC 134 (H) 08/25/2021   Lab Results  Component Value Date   TRIG 120 08/25/2021   TRIG 121 08/25/2021   Lab Results  Component Value Date   CHOLHDL 7.3 08/25/2021   Lab Results  Component Value Date   HGBA1C 5.1 08/25/2021      Assessment & Plan:   Problem List Items Addressed This Visit       Cardiovascular and Mediastinum   HTN (hypertension)    BP Readings from Last 1 Encounters:  10/06/21 (!) 144/98  Uncontrolled, but better than previous visit Mentions that he ran out of Hydralazine, refills provided Noncompliant with medications Continue Losartan considering h/o HFrEF Continue Coreg and Lasix for CHF Advised for low-salt diet  Relevant Medications   hydrALAZINE (APRESOLINE) 50 MG tablet   losartan (COZAAR) 50 MG tablet   carvedilol (COREG) 12.5 MG tablet   Congestive heart failure (HCC)    Recent CHF exacerbation Noncompliant to medications On Losartan, Coreg, Hydralazine and Lasix Needs to follow up with Cardiology      Relevant Medications   hydrALAZINE (APRESOLINE) 50 MG tablet   losartan (COZAAR) 50 MG tablet   carvedilol (COREG) 12.5 MG tablet     Respiratory   Acute bronchitis - Primary    Rapid flu and COVID test negative Started Azithromycin for acute bronchitis, has h/o HIV, prone for bacterial infections       Relevant Medications   azithromycin (ZITHROMAX) 250 MG tablet   Other Relevant Orders   Novel Coronavirus, NAA ([facility]) (Completed)   POCT Influenza A/B (Completed)     Other   Primary insomnia    Started Vistaril Has h/o polysubstance abuse, advised to avoid any substance abuse      Relevant Medications   hydrOXYzine (ATARAX/VISTARIL) 25 MG tablet    Meds ordered this encounter  Medications   hydrALAZINE (APRESOLINE) 50 MG tablet    Sig: Take 1 tablet (50 mg total) by mouth 3 (three) times daily.    Dispense:  90 tablet    Refill:  5   losartan (COZAAR) 50 MG tablet    Sig: Take 1 tablet (50 mg total) by mouth daily.    Dispense:  90 tablet    Refill:  1   carvedilol (COREG) 12.5 MG tablet    Sig: Take 1 tablet (12.5 mg total) by mouth 2 (two) times daily with a meal.    Dispense:  180 tablet    Refill:  1   hydrOXYzine (ATARAX/VISTARIL) 25 MG tablet    Sig: Take 1 tablet (25 mg total) by mouth at bedtime as needed for anxiety (Insomnia).    Dispense:  30 tablet    Refill:  0   azithromycin (ZITHROMAX) 250 MG tablet    Sig: Take 2 tablets on day 1, then 1 tablet daily on days 2 through 5    Dispense:  6 tablet    Refill:  0    Follow-up: Return in about 6 months (around 04/05/2022).    Lindell Spar, MD

## 2021-10-06 NOTE — Assessment & Plan Note (Signed)
Recent CHF exacerbation Noncompliant to medications On Losartan, Coreg, Hydralazine and Lasix Needs to follow up with Cardiology

## 2021-10-08 LAB — NOVEL CORONAVIRUS, NAA: SARS-CoV-2, NAA: NOT DETECTED

## 2021-10-08 LAB — SARS-COV-2, NAA 2 DAY TAT

## 2021-10-09 DIAGNOSIS — F5101 Primary insomnia: Secondary | ICD-10-CM | POA: Insufficient documentation

## 2021-10-09 NOTE — Assessment & Plan Note (Signed)
Started Vistaril Has h/o polysubstance abuse, advised to avoid any substance abuse

## 2021-10-09 NOTE — Assessment & Plan Note (Signed)
Rapid flu and COVID test negative Started Azithromycin for acute bronchitis, has h/o HIV, prone for bacterial infections

## 2021-10-13 ENCOUNTER — Ambulatory Visit: Payer: 59 | Admitting: Family

## 2021-10-15 NOTE — Progress Notes (Signed)
Left vm for pt to return call to schedule appt.

## 2021-10-16 ENCOUNTER — Telehealth (HOSPITAL_COMMUNITY): Payer: Self-pay | Admitting: Vascular Surgery

## 2021-10-16 NOTE — Telephone Encounter (Signed)
Left pt VM giving new chf appt w/ [facility] 12/12 @ 2:20 asked pt to call back to confirm appt

## 2021-10-17 ENCOUNTER — Ambulatory Visit (INDEPENDENT_AMBULATORY_CARE_PROVIDER_SITE_OTHER): Payer: 59 | Admitting: Family

## 2021-10-17 ENCOUNTER — Encounter: Payer: Self-pay | Admitting: Family

## 2021-10-17 ENCOUNTER — Other Ambulatory Visit: Payer: Self-pay

## 2021-10-17 VITALS — BP 148/99 | HR 70 | Temp 97.8°F | Wt 168.0 lb

## 2021-10-17 DIAGNOSIS — Z0000 Encounter for general adult medical examination without abnormal findings: Secondary | ICD-10-CM

## 2021-10-17 DIAGNOSIS — B20 Human immunodeficiency virus [HIV] disease: Secondary | ICD-10-CM

## 2021-10-17 MED ORDER — BIKTARVY 50-200-25 MG PO TABS: 1.0000 | ORAL_TABLET | Freq: Every day | ORAL | 5 refills | Status: DC

## 2021-10-17 NOTE — Progress Notes (Signed)
Brief Narrative   Patient ID: Richard Cox, male    DOB: [birth year], 40 y.o.   MRN: [id number]  [name] is a 40 y/o  AA male diagnosed with HIV in May of 2020 with risk factor for acquiring HIV including MSM. No history of opportunistic infection or acute retroviral syndrome. Initial blood work for entry to care with CD4 of 188 and viral load of 65,200 (CDC Stage 3). Genotype was wild with no significant resistance. Sole medication regimen of Biktarvy.   Subjective:    Chief Complaint  Patient presents with   Follow-up    B20    HPI:  Richard Cox is a 40 y.o. male with HIV disease last seen on 06/19/21 with well-controlled virus and good adherence and tolerance to his ART regimen of Biktarvy.  Viral load was undetectable with CD4 count of 285.  In the interim he has been hospitalized in October for acute systolic heart failure.  Here today for routine follow-up.  [name] continues to take his Biktarvy daily as prescribed with no adverse side effects.  Feeling well today with no new concerns/complaints. Denies fevers, chills, night sweats, headaches, changes in vision, neck pain/stiffness, nausea, diarrhea, vomiting, lesions or rashes.  [name] has no problems obtaining medication from the pharmacy remains covered by [employer].  Currently grieving with the recent death of his brother.  Denies any suicidal ideations and has good support system around him.  Uses marijuana approximately 1 time per week with no current alcohol consumption or tobacco use.  Condoms offered.  Healthcare maintenance due includes influenza vaccine.   Allergies  Allergen Reactions   Neosporin [Neomycin-Bacitracin Zn-Polymyx] Rash      Outpatient Medications Prior to Visit  Medication Sig Dispense Refill   acetaminophen (TYLENOL) 500 MG tablet Take 1,000 mg by mouth every 6 (six) hours as needed for moderate pain or headache.     aspirin 81 MG chewable tablet Chew 1 tablet (81 mg total) by mouth daily. 30 tablet 0    carvedilol (COREG) 12.5 MG tablet Take 1 tablet (12.5 mg total) by mouth 2 (two) times daily with a meal. 180 tablet 1   furosemide (LASIX) 40 MG tablet Take 1 tablet (40 mg total) by mouth daily as needed for fluid. 30 tablet 3   hydrALAZINE (APRESOLINE) 50 MG tablet Take 1 tablet (50 mg total) by mouth 3 (three) times daily. 90 tablet 5   hydrOXYzine (ATARAX/VISTARIL) 25 MG tablet Take 1 tablet (25 mg total) by mouth at bedtime as needed for anxiety (Insomnia). 30 tablet 0   losartan (COZAAR) 50 MG tablet Take 1 tablet (50 mg total) by mouth daily. 90 tablet 1   potassium chloride SA (KLOR-CON) 20 MEQ tablet Take 0.5 tablets (10 mEq total) by mouth daily as needed (when taking lasix). 30 tablet 1   bictegravir-emtricitabine-tenofovir AF (BIKTARVY) 50-200-25 MG TABS tablet TAKE 1 TABLET BY MOUTH 1 TIME A DAY. (Patient taking differently: Take 1 tablet by mouth daily.) 30 tablet 5   Facility-Administered Medications Prior to Visit  Medication Dose Route Frequency Provider Last Rate Last Admin   0.9 %  sodium chloride infusion   Intravenous Continuous Derek Jack, MD       0.9 %  sodium chloride infusion   Intravenous Continuous Derek Jack, MD   Stopped at 09/26/20 1124   0.9 %  sodium chloride infusion   Intravenous Continuous Derek Jack, MD 20 mL/hr at 11/04/20 0934 New Bag at 11/04/20 [phone]  0.9 %  sodium chloride infusion   Intravenous Continuous Derek Jack, MD 500 mL/hr at 11/04/20 0935 Bolus from Bag at 11/04/20 0935   0.9 %  sodium chloride infusion   Intravenous Continuous Derek Jack, MD   Stopped at 11/07/20 1110   0.9 %  sodium chloride infusion   Intravenous Continuous Derek Jack, MD   Stopped at 12/25/20 1117   0.9 %  sodium chloride infusion   Intravenous Continuous Derek Jack, MD       sodium chloride flush (NS) 0.9 % injection 10 mL  10 mL Intravenous PRN Derek Jack, MD   10 mL at 10/15/20 1035   sodium  chloride flush (NS) 0.9 % injection 10 mL  10 mL Intracatheter PRN Derek Jack, MD   10 mL at 11/04/20 0800   sodium chloride flush (NS) 0.9 % injection 10 mL  10 mL Intravenous PRN Derek Jack, MD   10 mL at 11/07/20 1046     Past Medical History:  Diagnosis Date   Acute respiratory failure with hypoxia ([place]) 08/24/2021   CHF (congestive heart failure) ([place])    Endotracheally intubated 08/24/2021   Essential hypertension    HIV infection ([place])    Lymphoma ([place]) 08/28/2020   Noncompliance with medication regimen    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  Eyes:  Negative for visual disturbance.  Respiratory:  Negative for cough, chest tightness, shortness of breath and wheezing.   Cardiovascular:  Negative for chest pain and leg swelling.  Gastrointestinal:  Negative for abdominal pain, constipation, diarrhea, nausea and vomiting.  Genitourinary:  Negative for dysuria, flank pain, frequency, genital sores, hematuria and urgency.  Skin:  Negative for rash.  Allergic/Immunologic: Negative for immunocompromised state.  Neurological:  Negative for dizziness and headaches.     Objective:    BP (!) 148/99   Pulse 70   Temp 97.8 F (36.6 C) (Oral)   Wt 168 lb (76.2 kg)   BMI 22.16 kg/m  Nursing note and vital signs reviewed.  Physical Exam Constitutional:      General: He is not in acute distress.    Appearance: He is well-developed.  Eyes:     Conjunctiva/sclera: Conjunctivae normal.  Cardiovascular:     Rate and Rhythm: Normal rate and regular rhythm.     Heart sounds: Normal heart sounds. No murmur heard.   No friction rub. No gallop.  Pulmonary:     Effort: Pulmonary effort is normal. No respiratory  distress.     Breath sounds: Normal breath sounds. No wheezing or rales.  Chest:     Chest wall: No tenderness.  Abdominal:     General: Bowel sounds are normal.     Palpations: Abdomen is soft.     Tenderness: There is no abdominal tenderness.  Musculoskeletal:     Cervical back: Neck supple.  Lymphadenopathy:     Cervical: No cervical adenopathy.  Skin:    General: Skin is warm and dry.     Findings: No rash.  Neurological:     Mental Status: He is alert and oriented to person, place, and time.  Psychiatric:        Behavior: Behavior normal.  Thought Content: Thought content normal.        Judgment: Judgment normal.     Depression screen [facility] 2/9 10/17/2021 10/06/2021 09/10/2021 01/27/2021 09/10/2020  Decreased Interest 0 0 0 0 -  Down, Depressed, Hopeless 0 2 0 0 1  PHQ - 2 Score 0 2 0 0 1  Altered sleeping 0 3 - - 0  Tired, decreased energy 0 3 - - 0  Change in appetite 0 3 - - 0  Feeling bad or failure about yourself  0 0 - - 0  Trouble concentrating 0 0 - - 0  Moving slowly or fidgety/restless 0 0 - - 0  Suicidal thoughts 0 0 - - 0  PHQ-9 Score 0 11 - - 1  Difficult doing work/chores - Somewhat difficult - - Somewhat difficult       Assessment & Plan:    Patient Active Problem List   Diagnosis Date Noted   Healthcare maintenance 10/17/2021   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   Acute bronchitis 10/06/2021   Hospital discharge follow-up 09/10/2021   Elevated troponin 08/24/2021   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   Intra-abdominal lymphadenopathy 07/30/2020   Generalized lymphadenopathy 07/25/2020   Varicella zoster 01/04/2020   AIDS (acquired immune deficiency syndrome) ([place]) 10/10/2019   Chronic combined systolic and diastolic CHF (congestive heart failure) ([place]) 09/18/2019   HIV (human immunodeficiency virus infection) ([place]) 09/18/2019   Substance abuse ([place]) 09/18/2019   Alcohol abuse 09/18/2019   Tobacco abuse 09/18/2019   HTN (hypertension)  03/10/2019   Congestive heart failure ([place]) 03/10/2019   Noncompliance with medication regimen 03/10/2019     Problem List Items Addressed This Visit       Other   AIDS (acquired immune deficiency syndrome) [facility])    Mr. Slabach continues to have well-controlled virus with good adherence and tolerance to his ART regimen of Biktarvy.  No signs/symptoms of opportunistic infection.  We reviewed lab work and discussed plan of care.  Continue current dose of Biktarvy.  Check blood work today.  Plan for follow-up in 4 months or sooner if needed with lab work on the same day.      Relevant Medications   bictegravir-emtricitabine-tenofovir AF (BIKTARVY) 50-200-25 MG TABS tablet   Other Relevant Orders   HIV-1 RNA quant-no reflex-bld   T-helper cells (CD4) count   Healthcare maintenance    Discussed importance of safe sexual practice and condom use.  Condoms offered. Healthcare maintenance due includes influenza vaccine which he will get with his primary care per his request. Discussed/recommended routine dental care.        I have changed [employer]. I am also having him maintain his acetaminophen, aspirin, furosemide, potassium chloride SA, hydrALAZINE, losartan, carvedilol, and hydrOXYzine.   Meds ordered this encounter  Medications   bictegravir-emtricitabine-tenofovir AF (BIKTARVY) 50-200-25 MG TABS tablet    Sig: Take 1 tablet by mouth daily.    Dispense:  30 tablet    Refill:  5    Order Specific Question:   Supervising Provider    Answer:   Carlyle Basques [[id number]]     Follow-up: Return in about 4 months (around 02/15/2022).   Terri Piedra, MSN, FNP-C Nurse Practitioner [facility] for Infectious Disease [place] number: [phone]

## 2021-10-17 NOTE — Assessment & Plan Note (Signed)
Richard Cox continues to have well-controlled virus with good adherence and tolerance to his ART regimen of Biktarvy.  No signs/symptoms of opportunistic infection.  We reviewed lab work and discussed plan of care.  Continue current dose of Biktarvy.  Check blood work today.  Plan for follow-up in 4 months or sooner if needed with lab work on the same day.

## 2021-10-17 NOTE — Patient Instructions (Signed)
Nice to see you.  We will check your lab work today.  Continue to take your medication daily as prescribed.  Refills have been sent to the pharmacy.  Plan for follow up in 4 months or sooner if needed with lab work on the same day.  Have a great day and stay safe!  

## 2021-10-17 NOTE — Assessment & Plan Note (Signed)
   Discussed importance of safe sexual practice and condom use.  Condoms offered.  Healthcare maintenance due includes influenza vaccine which he will get with his primary care per his request.  Discussed/recommended routine dental care.

## 2021-10-20 ENCOUNTER — Encounter (HOSPITAL_COMMUNITY): Payer: 59 | Admitting: Cardiology

## 2021-10-20 LAB — HIV-1 RNA QUANT-NO REFLEX-BLD
HIV 1 RNA Quant: NOT DETECTED Copies/mL
HIV-1 RNA Quant, Log: NOT DETECTED Log cps/mL

## 2021-10-20 LAB — T-HELPER CELLS (CD4) COUNT (NOT AT ARMC)
Absolute CD4: 271 cells/uL — ABNORMAL LOW (ref 490–1740)
CD4 T Helper %: 24 % — ABNORMAL LOW (ref 30–61)
Total lymphocyte count: 1131 cells/uL (ref 850–3900)

## 2021-10-21 ENCOUNTER — Other Ambulatory Visit (HOSPITAL_COMMUNITY): Payer: Self-pay | Admitting: *Deleted

## 2021-10-21 ENCOUNTER — Encounter (HOSPITAL_COMMUNITY): Payer: 59 | Admitting: Cardiology

## 2021-10-21 DIAGNOSIS — C8510 Unspecified B-cell lymphoma, unspecified site: Secondary | ICD-10-CM

## 2021-10-22 ENCOUNTER — Inpatient Hospital Stay (HOSPITAL_COMMUNITY): Payer: 59 | Attending: Hematology

## 2021-10-23 NOTE — Progress Notes (Deleted)
Cardiology Office Note    Date:  10/23/2021   ID:  Richard Cox, DOB Sep 11, 1981, MRN [id number]  PCP:  Lindell Spar, MD  Cardiologist: Rozann Lesches, MD    No chief complaint on file.   History of Present Illness:    Richard Cox is a 40 y.o. 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 Delton Coombes), HIV and substance abuse (alcohol and cocaine) who presents to the office today for overdue hospital follow-up.  He was admitted to [facility] in 08/2021 for evaluation of worsening dyspnea found to have hypertensive emergency upon arrival with BP initially at 247/148. Given worsening respiratory distress, he did require intubation. Cardiology was consulted as his initial EKG showed deep TWI and some ST elevation along V3 and V4.  Troponin values were flat, peaking at 177 and follow-up echocardiogram showed his EF was around 45 to 50% and he did have global hypokinesis and severe asymmetric LVH with grade 2 diastolic dysfunction. He did undergo a cardiac MRI and findings were most consistent with hypertrophic cardiomyopathy without obstruction and EF was estimated at 30 to 35% with global hypokinesis. It was recommended that he follow-up with AHF as an outpatient and could consider EP referral but there was no family history of sudden cardiac death and he was not felt to be an ICD candidate for primary prevention given his continued cocaine use and medication noncompliance. He did cancel his visit with AHF following hospital discharge.   - BMET  Past Medical History:  Diagnosis Date   Acute respiratory failure with hypoxia ([place]) 08/24/2021   CHF (congestive heart failure) ([facility])    Endotracheally intubated 08/24/2021   Essential hypertension    HIV  infection ([place])    Lymphoma ([place]) 08/28/2020   Noncompliance with medication regimen    Secondary cardiomyopathy ([place])     Past Surgical History:  Procedure Laterality Date   IR IMAGING GUIDED PORT INSERTION  08/23/2020   Right   NO PAST SURGERIES      Current Medications: Outpatient Medications Prior to Visit  Medication Sig Dispense Refill   acetaminophen (TYLENOL) 500 MG tablet Take 1,000 mg by mouth every 6 (six) hours as needed for moderate pain or headache.     aspirin 81 MG chewable tablet Chew 1 tablet (81 mg total) by mouth daily. 30 tablet 0   bictegravir-emtricitabine-tenofovir AF (BIKTARVY) 50-200-25 MG TABS tablet Take 1 tablet by mouth daily. 30 tablet 5   carvedilol (COREG) 12.5 MG tablet Take 1 tablet (12.5 mg total) by mouth 2 (two) times daily with a meal. 180 tablet 1   furosemide (LASIX) 40 MG tablet Take 1 tablet (40 mg total) by mouth daily as needed for fluid. 30 tablet 3   hydrALAZINE (APRESOLINE) 50 MG tablet Take 1 tablet (50 mg total) by mouth 3 (three) times daily. 90 tablet 5   hydrOXYzine (ATARAX/VISTARIL) 25 MG tablet Take 1 tablet (25 mg total) by mouth at bedtime as needed for anxiety (Insomnia). 30 tablet 0   losartan (COZAAR) 50 MG tablet Take 1 tablet (50 mg total) by mouth daily. 90 tablet 1   potassium chloride SA (KLOR-CON) 20 MEQ tablet Take 0.5 tablets (10 mEq total) by mouth daily as needed (when taking lasix). 30 tablet 1   Facility-Administered  Medications Prior to Visit  Medication Dose Route Frequency Provider Last Rate Last Admin   0.9 %  sodium chloride infusion   Intravenous Continuous Derek Jack, MD       0.9 %  sodium chloride infusion   Intravenous Continuous Derek Jack, MD   Stopped at 09/26/20 1124   0.9 %  sodium chloride infusion   Intravenous Continuous Derek Jack, MD 20 mL/hr at 11/04/20 0934 New Bag at 11/04/20 0934   0.9 %  sodium chloride infusion   Intravenous Continuous Derek Jack, MD  500 mL/hr at 11/04/20 0935 Bolus from Bag at 11/04/20 0935   0.9 %  sodium chloride infusion   Intravenous Continuous Derek Jack, MD   Stopped at 11/07/20 1110   0.9 %  sodium chloride infusion   Intravenous Continuous Derek Jack, MD   Stopped at 12/25/20 1117   0.9 %  sodium chloride infusion   Intravenous Continuous Derek Jack, MD       sodium chloride flush (NS) 0.9 % injection 10 mL  10 mL Intravenous PRN Derek Jack, MD   10 mL at 10/15/20 1035   sodium chloride flush (NS) 0.9 % injection 10 mL  10 mL Intracatheter PRN Derek Jack, MD   10 mL at 11/04/20 0800   sodium chloride flush (NS) 0.9 % injection 10 mL  10 mL Intravenous PRN Derek Jack, MD   10 mL at 11/07/20 1046     Allergies:   Neosporin [neomycin-bacitracin zn-polymyx]   Social History   Socioeconomic History   Marital status: Single    Spouse name: Not on file   Number of children: 0   Years of education: 13   Highest education level: Not on file  Occupational History   Not on file  Tobacco Use   Smoking status: Former    Packs/day: 0.25    Types: Cigarettes   Smokeless tobacco: Never  Vaping Use   Vaping Use: Never used  Substance and Sexual Activity   Alcohol use: Not Currently   Drug use: Not Currently    Types: Marijuana    Comment: x 1 week   Sexual activity: Not Currently    Comment: declined condoms  Other Topics Concern   Not on file  Social History Narrative   Not on file   Social Determinants of Health   Financial Resource Strain: Not on file  Food Insecurity: Not on file  Transportation Needs: Not on file  Physical Activity: Not on file  Stress: Not on file  Social Connections: Not on file     Family History:  The patient's ***family history includes Cancer in his father and mother; Congestive Heart Failure in his brother; Diabetes Mellitus II in his mother; Heart Problems in his brother; Lupus in his brother.   Review of Systems:     Please see the history of present illness.     All other systems reviewed and are otherwise negative except as noted above.   Physical Exam:    VS:  There were no vitals taken for this visit.   General: Well developed, well nourished,male appearing in no acute distress. Head: Normocephalic, atraumatic. Neck: No carotid bruits. JVD not elevated.  Lungs: Respirations regular and unlabored, without wheezes or rales.  Heart: ***Regular rate and rhythm. No S3 or S4.  No murmur, no rubs, or gallops appreciated. Abdomen: Appears non-distended. No obvious abdominal masses. Msk:  Strength and tone appear normal for age. No obvious joint deformities or effusions. Extremities: No  clubbing or cyanosis. No edema.  Distal pedal pulses are 2+ bilaterally. Neuro: Alert and oriented X 3. Moves all extremities spontaneously. No focal deficits noted. Psych:  Responds to questions appropriately with a normal affect. Skin: No rashes or lesions noted  Wt Readings from Last 3 Encounters:  10/17/21 168 lb (76.2 kg)  10/06/21 165 lb 1.9 oz (74.9 kg)  09/10/21 170 lb 1.3 oz (77.1 kg)        Studies/Labs Reviewed:   EKG:  EKG is*** ordered today.  The ekg ordered today demonstrates ***  Recent Labs: 08/24/2021: B Natriuretic Peptide 1,714.0; TSH 0.786 08/27/2021: Magnesium 2.0 09/10/2021: ALT 15; BUN 22; Creatinine, Ser 1.73; Hemoglobin 12.0; Platelets 141; Potassium 5.0; Sodium 147   Lipid Panel    Component Value Date/Time   CHOL 183 08/25/2021 0336   TRIG 120 08/25/2021 0336   TRIG 121 08/25/2021 0336   HDL 25 (L) 08/25/2021 0336   CHOLHDL 7.3 08/25/2021 0336   VLDL 24 08/25/2021 0336   LDLCALC 134 (H) 08/25/2021 0336   LDLCALC 132 (H) 01/27/2021 0951    Additional studies/ records that were reviewed today include:   Echocardiogram: 08/2021 IMPRESSIONS     1. There is severe asymmetric hypertrophy of the septum up to 29 mm on  this study. The PW is severely hypertrophied up to  20 mm. There is no SAM  of the mitral valve. There is no LVOT obstruction. Findings could  represent hypertrophic cardiomyopathy  versus other infiltrative process such as cardiac amyloidosis. Would  recommend cardiac MRI for clarification. EF appears to be mildly reduced  on this study, EF 45-50%. Left ventricular ejection fraction, by  estimation, is 45 to 50%. The left ventricle  has mildly decreased function. The left ventricle demonstrates global  hypokinesis. There is severe asymmetric left ventricular hypertrophy of  the septal segment. Left ventricular diastolic parameters are consistent  with Grade II diastolic dysfunction  (pseudonormalization).   2. Right ventricular systolic function is normal. The right ventricular  size is normal. Tricuspid regurgitation signal is inadequate for assessing  PA pressure.   3. Left atrial size was moderately dilated.   4. The mitral valve is grossly normal. No evidence of mitral valve  regurgitation. No evidence of mitral stenosis.   5. The aortic valve is tricuspid. Aortic valve regurgitation is not  visualized. No aortic stenosis is present.   Cardiac MRI: 08/2021 IMPRESSION: 1. 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 31 mm massive septal hypertrophy in the mid left ventricular septum. Significant diffuse delayed myocardial enhancement appears >15% of myocardial mass. ECV 36%, nonspecific elevated. T2 is 59 msec in area of maximal wall thickness. Findings of >30 mm wall thickness and elevated T2 signal suggest higher risk phenotype HCM.   2. Severely reduced LV systolic function by visual estimate, LVEF 30-35% with global hypokinesis.   2.  Mildly reduced RV function with normal RV size.   4. ECV value and inversion time myocardial nulling kinetics suggest against a diagnosis of cardiac amyloidosis.    Assessment:    No diagnosis found.   Plan:   In order of  problems listed above:  ***    Shared Decision Making/Informed Consent:   {Are you ordering a CV Procedure (e.g. stress test, cath, DCCV, TEE, etc)?   Press F2        :[id number]}    Medication Adjustments/Labs and Tests Ordered: Current medicines are reviewed at length with the patient today.  Concerns  regarding medicines are outlined above.  Medication changes, Labs and Tests ordered today are listed in the Patient Instructions below. There are no Patient Instructions on file for this visit.   Signed, Erma Heritage, PA-C  10/23/2021 11:31 AM    [facility] [address] [place], [place] [address] Phone: [phone] Fax: [phone]

## 2021-10-24 ENCOUNTER — Ambulatory Visit: Payer: 59 | Admitting: Student

## 2021-10-29 ENCOUNTER — Ambulatory Visit (HOSPITAL_COMMUNITY): Payer: 59 | Admitting: Hematology

## 2021-11-26 ENCOUNTER — Encounter (HOSPITAL_COMMUNITY): Payer: Self-pay | Admitting: Hematology

## 2021-11-27 ENCOUNTER — Encounter (HOSPITAL_COMMUNITY): Payer: Self-pay | Admitting: Hematology

## 2021-12-03 ENCOUNTER — Encounter (HOSPITAL_COMMUNITY): Payer: 59 | Admitting: Cardiology

## 2021-12-05 ENCOUNTER — Ambulatory Visit: Payer: Self-pay

## 2021-12-08 ENCOUNTER — Encounter: Payer: Self-pay | Admitting: Family

## 2021-12-11 ENCOUNTER — Other Ambulatory Visit: Payer: Self-pay | Admitting: Pharmacist

## 2021-12-11 DIAGNOSIS — B20 Human immunodeficiency virus [HIV] disease: Secondary | ICD-10-CM

## 2021-12-11 MED ORDER — BIKTARVY 50-200-25 MG PO TABS: 1.0000 | ORAL_TABLET | Freq: Every day | ORAL | 0 refills | Status: AC

## 2021-12-11 NOTE — Progress Notes (Signed)
Medication Samples have been provided to the patient.  Drug name: Biktarvy        Strength: 50/200/25 mg       Qty: 14 tablets (2 bottles)   LOT: [id number]   Exp.Date: 08/10/2023  Dosing instructions: Take one tablet by mouth once daily  The patient has been instructed regarding the correct time, dose, and frequency of taking this medication, including desired effects and most common side effects.   Ameka Krigbaum L. Eber Hong, PharmD, BCIDP, AAHIVP, CPP Clinical Pharmacist Practitioner Infectious Diseases [facility] Infect[facility] 10/21/2020, 10:07 AM

## 2021-12-16 NOTE — Progress Notes (Signed)
Reviewed at 07/01/21 visit by Dr. Lorenso Courier

## 2021-12-22 ENCOUNTER — Other Ambulatory Visit (HOSPITAL_COMMUNITY): Payer: Self-pay

## 2021-12-24 ENCOUNTER — Other Ambulatory Visit (HOSPITAL_COMMUNITY): Payer: Self-pay

## 2021-12-24 ENCOUNTER — Other Ambulatory Visit: Payer: Self-pay

## 2021-12-24 DIAGNOSIS — B20 Human immunodeficiency virus [HIV] disease: Secondary | ICD-10-CM

## 2021-12-24 MED ORDER — BIKTARVY 50-200-25 MG PO TABS: 1.0000 | ORAL_TABLET | Freq: Every day | ORAL | 5 refills | Status: DC

## 2021-12-25 ENCOUNTER — Other Ambulatory Visit: Payer: Self-pay | Admitting: Pharmacist

## 2021-12-25 DIAGNOSIS — B20 Human immunodeficiency virus [HIV] disease: Secondary | ICD-10-CM

## 2021-12-25 MED ORDER — BIKTARVY 50-200-25 MG PO TABS: 1.0000 | ORAL_TABLET | Freq: Every day | ORAL | 0 refills | Status: AC

## 2021-12-25 NOTE — Progress Notes (Signed)
Medication Samples have been provided to the patient.  Drug name: Biktarvy        Strength: 50/200/25 mg       Qty: 14 tablets (2 bottles) LOT: [id number]   Exp.Date: 08/10/2023  Dosing instructions: Take one tablet by mouth once daily  The patient has been instructed regarding the correct time, dose, and frequency of taking this medication, including desired effects and most common side effects.   Cheronda Erck L. Eber Hong, PharmD, BCIDP, AAHIVP, CPP Clinical Pharmacist Practitioner Infectious Diseases [facility] 10/21/2020, 10:07 AM

## 2021-12-31 ENCOUNTER — Telehealth: Payer: Self-pay | Admitting: Internal Medicine

## 2021-12-31 ENCOUNTER — Other Ambulatory Visit: Payer: Self-pay | Admitting: *Deleted

## 2021-12-31 DIAGNOSIS — F5101 Primary insomnia: Secondary | ICD-10-CM

## 2021-12-31 DIAGNOSIS — I10 Essential (primary) hypertension: Secondary | ICD-10-CM

## 2021-12-31 DIAGNOSIS — I5042 Chronic combined systolic (congestive) and diastolic (congestive) heart failure: Secondary | ICD-10-CM

## 2021-12-31 MED ORDER — LOSARTAN POTASSIUM 50 MG PO TABS: 50.0000 mg | ORAL_TABLET | Freq: Every day | ORAL | 1 refills | Status: DC

## 2021-12-31 MED ORDER — FUROSEMIDE 40 MG PO TABS: 40.0000 mg | ORAL_TABLET | Freq: Every day | ORAL | 3 refills | Status: DC | PRN

## 2021-12-31 MED ORDER — HYDROXYZINE HCL 25 MG PO TABS: 25.0000 mg | ORAL_TABLET | Freq: Every evening | ORAL | 0 refills | Status: DC | PRN

## 2021-12-31 MED ORDER — CARVEDILOL 12.5 MG PO TABS: 12.5000 mg | ORAL_TABLET | Freq: Two times a day (BID) | ORAL | 1 refills | Status: DC

## 2021-12-31 NOTE — Telephone Encounter (Signed)
Medication sent to pharmacy  

## 2021-12-31 NOTE — Telephone Encounter (Signed)
Hydroxyzine , Losartan, Furosemide, Carvedilol,   Please send to [facility] in [place]

## 2022-01-08 ENCOUNTER — Encounter (HOSPITAL_COMMUNITY): Payer: 59 | Admitting: Cardiology

## 2022-01-27 ENCOUNTER — Telehealth: Payer: Self-pay

## 2022-01-27 ENCOUNTER — Encounter (HOSPITAL_COMMUNITY): Payer: Self-pay | Admitting: Emergency Medicine

## 2022-01-27 NOTE — Telephone Encounter (Signed)
Richard Cox with [employer] called requesting peer to peer to ask if Diona Browner has any restrictions on patient as far disabilities. Call back number is [phone] ? ? ? ? ?

## 2022-01-27 NOTE — Progress Notes (Signed)
Call from Dr Shmidt's office wanting to do a peer to peer with Dr Delton Coombes about disability paperwork.  Wanted to know if we had any limitations or restrictions on him.  We do not have any restrictions or limitations on him.  He is currently not being treated for his lymphoma he is in observations.  Pt has not been seen since August 2022.  ?

## 2022-01-28 NOTE — Telephone Encounter (Signed)
Called and left HIPPA compliant message on voicemail. From an HIV standpoint he does not have any disabilities, but I cannot speak for his other medical conditions. Happy to speak with representative if needed.  ?

## 2022-02-10 ENCOUNTER — Encounter (HOSPITAL_COMMUNITY): Payer: 59 | Admitting: Cardiology

## 2022-02-14 IMAGING — MR MR CARD MORPHOLOGY WO/W CM
42 of 48 series · 42 of 48 positions shown · IV contrast (Contrast agent)
Comparison: Echocardiogram 08/24/21

EXAM:
CARDIAC MRI

CLINICAL DATA: Cardiac amyloidosis vs HCM, further testing
TECHNIQUE: The patient was scanned on a 1.5 Tesla GE magnet. A dedicated
cardiac coil was used. Functional imaging was done using [employer]
sequences. [DATE], and 4 chamber views were done to assess for RWMA's.
Modified Chu rule using a short axis stack was used to
calculate an ejection fraction on a dedicated work station using
[employer] software. The patient received 8mL GADAVIST GADOBUTROL 1
MMOL/ML IV SOLN. After 10 minutes inversion recovery sequences were
used to assess for infiltration and scar tissue.

[Series 4: t2_haste_db_tra_bh · axial · 8.0mm · 1.41mm/px · 1 of 22 slices shown]
[im 1/22]
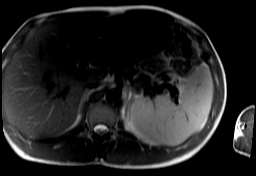

[Series 8: bSSFP · oblique · 8.0mm · 1.61mm/px · 1 of 25 slices shown (1 of 32)]
[im 1/25]
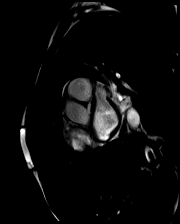

[Series 9: bSSFP · oblique · 8.0mm · 1.61mm/px · 1 of 25 slices shown (2 of 32)]
[im 1/25]
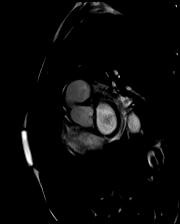

[Series 10: bSSFP · oblique · 8.0mm · 1.61mm/px · 1 of 25 slices shown (3 of 32)]
[im 1/25]
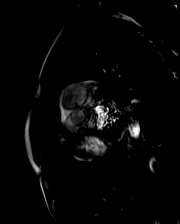

[Series 11: bSSFP · oblique · 8.0mm · 1.61mm/px · 1 of 25 slices shown (4 of 32)]
[im 1/25]
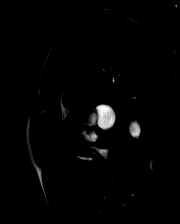

[Series 13: bSSFP · oblique · 8.0mm · 1.61mm/px · 1 of 25 slices shown (5 of 32)]
[im 1/25]
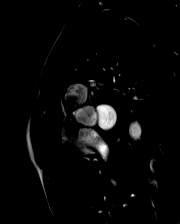

[Series 14: bSSFP · oblique · 8.0mm · 1.61mm/px · 1 of 25 slices shown (6 of 32)]
[im 1/25]
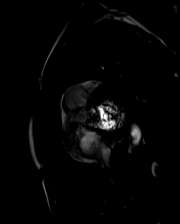

[Series 15: bSSFP · oblique · 8.0mm · 1.61mm/px · 1 of 25 slices shown (7 of 32)]
[im 1/25]
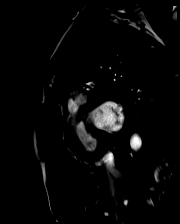

[Series 16: bSSFP · oblique · 8.0mm · 1.61mm/px · 1 of 25 slices shown (8 of 32)]
[im 1/25]
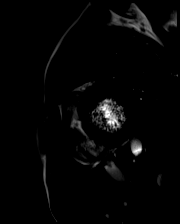

[Series 18: bSSFP · oblique · 8.0mm · 1.61mm/px · 1 of 25 slices shown (9 of 32)]
[im 1/25]
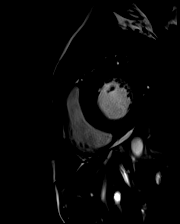

[Series 19: bSSFP · oblique · 8.0mm · 1.61mm/px · 1 of 25 slices shown (10 of 32)]
[im 1/25]
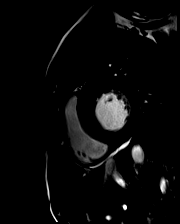

[Series 20: bSSFP · oblique · 8.0mm · 1.61mm/px · 1 of 25 slices shown (11 of 32)]
[im 1/25]
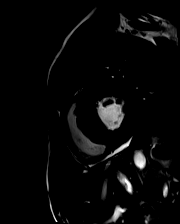

[Series 21: bSSFP · oblique · 8.0mm · 1.61mm/px · 1 of 25 slices shown (12 of 32)]
[im 1/25]
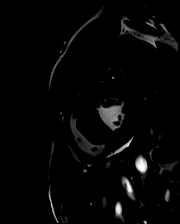

[Series 22: bSSFP · oblique · 8.0mm · 1.61mm/px · 1 of 25 slices shown (13 of 32)]
[im 1/25]
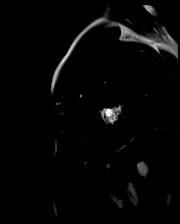

[Series 23: bSSFP · oblique · 8.0mm · 1.61mm/px · 1 of 25 slices shown (14 of 32)]
[im 1/25]
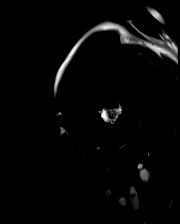

[Series 25: bSSFP · oblique · 8.0mm · 1.61mm/px · 1 of 25 slices shown (15 of 32)]
[im 1/25]
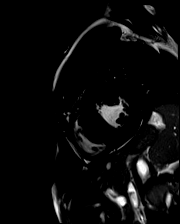

[Series 26: bSSFP · oblique · 8.0mm · 1.61mm/px · 1 of 25 slices shown (16 of 32)]
[im 1/25]
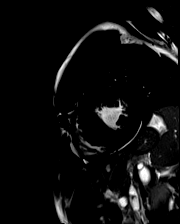

[Series 27: bSSFP · oblique · 8.0mm · 1.61mm/px · 1 of 25 slices shown (17 of 32)]
[im 1/25]
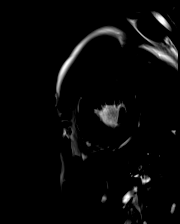

[Series 28: bSSFP · oblique · 8.0mm · 1.61mm/px · 1 of 25 slices shown (18 of 32)]
[im 1/25]
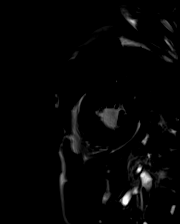

[Series 29: bSSFP · oblique · 8.0mm · 1.61mm/px · 1 of 25 slices shown (19 of 32)]
[im 1/25]
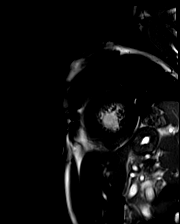

[Series 30: bSSFP · oblique · 8.0mm · 1.61mm/px · 1 of 25 slices shown (20 of 32)]
[im 1/25]
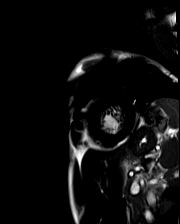

[Series 31: bSSFP · oblique · 8.0mm · 1.61mm/px · 1 of 25 slices shown (21 of 32)]
[im 1/25]
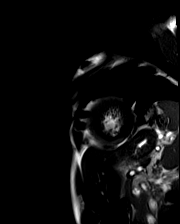

[Series 32: bSSFP · oblique · 8.0mm · 1.61mm/px · 1 of 25 slices shown (22 of 32)]
[im 1/25]
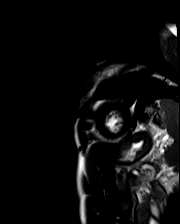

[Series 33: bSSFP · oblique · 8.0mm · 1.61mm/px · 1 of 25 slices shown (23 of 32)]
[im 1/25]
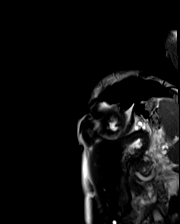

[Series 34: bSSFP · oblique · 8.0mm · 1.61mm/px · 1 of 25 slices shown (24 of 32)]
[im 1/25]
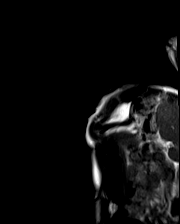

[Series 35: bSSFP · oblique · 8.0mm · 1.61mm/px · 1 of 25 slices shown (25 of 32)]
[im 1/25]
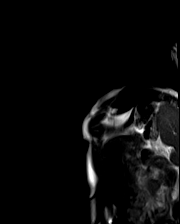

[Series 36: bSSFP · oblique · 8.0mm · 1.61mm/px · 1 of 25 slices shown (26 of 32)]
[im 1/25]
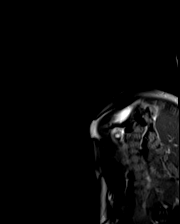

[Series 37: bSSFP · oblique · 8.0mm · 1.61mm/px · 1 of 25 slices shown (27 of 32)]
[im 1/25]
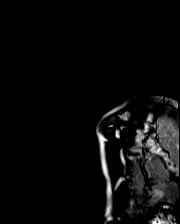

[Series 38: bSSFP · oblique · 6.0mm · 1.41mm/px · 1 of 25 slices shown (28 of 32)]
[im 1/25]
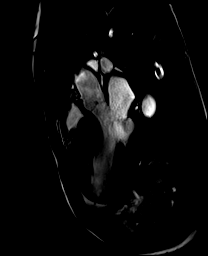

[Series 39: bSSFP · sagittal · 6.0mm · 1.41mm/px · 1 of 25 slices shown (29 of 32)]
[im 1/25]
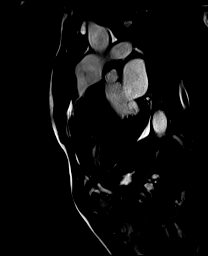

[Series 40: bSSFP · oblique · 6.0mm · 1.41mm/px · 1 of 25 slices shown (30 of 32)]
[im 1/25]
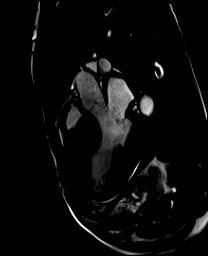

[Series 41: bSSFP · oblique · 6.0mm · 1.41mm/px · 1 of 25 slices shown (31 of 32)]
[im 1/25]
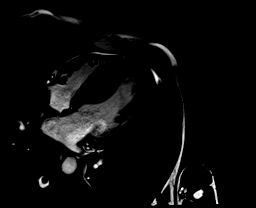

[Series 42: bSSFP · sagittal · 6.0mm · 1.41mm/px · 1 of 25 slices shown (32 of 32)]
[im 1/25]
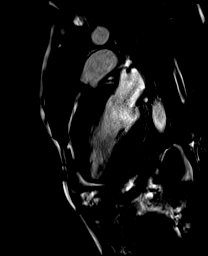

[Series 46: (id)_long_t1 · oblique · 8.0mm · 1.56mm/px · 1 of 24 slices shown]
[im 1/24]
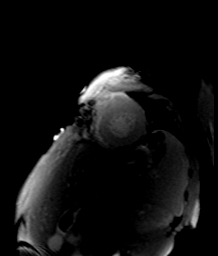

[Series 47: (id)_long_t1_moco · oblique · 8.0mm · 1.56mm/px · 1 of 24 slices shown]
[im 1/24]
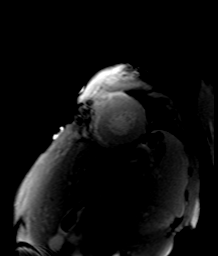

[Series 48: (id)_long_t1_moco_t1 · oblique · 8.0mm · 1.56mm/px · 1 of 6 slices shown]
[im 1/6]
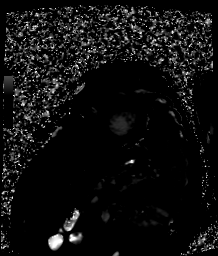

[Series 50: (id)_trufi · oblique · 8.0mm · 2.08mm/px · 1 of 9 slices shown]
[im 1/9]
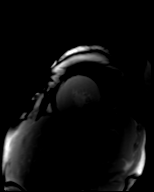

[Series 51: (id)_trufi_moco · oblique · 8.0mm · 2.08mm/px · 1 of 9 slices shown]
[im 1/9]
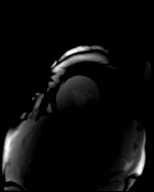

[Series 52: (id)_trufi_moco_t2 · oblique · 8.0mm · 2.08mm/px · 1 of 5 slices shown]
[im 1/5]
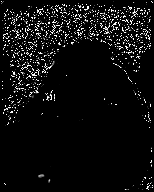

[Series 54: pre short axis · oblique · non-contrast · 8.0mm · 2.25mm/px · 1 of 10 slices shown (1 of 3)]
[im 1/10]
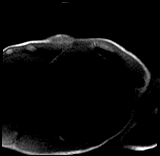

[Series 55: pre short axis · oblique · non-contrast · 8.0mm · 2.25mm/px · 1 of 10 slices shown (2 of 3)]
[im 1/10]
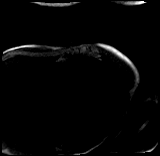

[Series 56: pre short axis · oblique · non-contrast · 8.0mm · 2.25mm/px · 1 of 10 slices shown (3 of 3)]
[im 1/10]
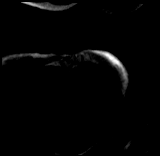

[42 of 48 positions shown; findings below may reference images not displayed]

This examination is tailored for evaluation cardiac anatomy and
function and provides very limited assessment of noncardiac
structures, which are accordingly not evaluated during
interpretation. If there is clinical concern for extracardiac
pathology, further evaluation with CT imaging should be considered.
FINDINGS: LEFT VENTRICLE:

Normal left ventricular chamber size.

Maximal wall thickness: 31 mm

Location: Mid ventricular septum

There is no systolic anterior motion of the mitral valve. No flow
dephasing to suggests left ventricular outflow tract obstruction.
Mitral regurgitation is not seen.

No evidence of left ventricular apical aneurysm.

Findings are consistent with hypertrophic cardiomyopathy without
obstruction. Morphologic subtype: Reverse curve

Severely reduced LV systolic function. LVEF cannot accurately be
calculated but appears 30-35% and appears similar in comparison to
echo.

Severe global hypokinesis.

Elevated T2 signal in the base and mid ventricle. T2 59 msec at
basal and mid, and 52 msec at the apex.

Normal first pass perfusion.

There is post contrast delayed myocardial enhancement. There is
diffuse delayed myocardial enhancement in the area of maximal wall
thickness, in the anterior, septal and inferior walls from base to
mid-apical ventricle. At the mid to apical ventricle the lateral
wall also appears involved. LGE appears >15% of the myocardial mass.

Normal T1 myocardial nulling kinetics suggest against a diagnosis of
cardiac amyloidosis.

ECV = 34%

RIGHT VENTRICLE:

Normal right ventricular chamber size.

Normal right ventricular wall thickness.

Mildly reduced right ventricular systolic function.

Mild global hypokinesis.

No post contrast delayed myocardial enhancement.

ATRIA:

Normal left atrial size.

Normal right atrial size.

VALVES:

No significant valvular abnormalities.

Tricuspid aortic valve.

PERICARDIUM:

Normal pericardium.  Trivial pericardial effusion.

OTHER: No significant extracardiac findings. Please reference recent
PET imaging for full evaluation of patient's known B-cell lymphoma,
which is not evaluated on this exam.

MEASUREMENTS:
Volumes and EF cannot be accurately measured on this study due to
technical factors.
IMPRESSION: 1. Findings most consistent with hypertrophic cardiomyopathy without
obstruction, reverse curve morphology. 31 mm massive septal
hypertrophy in the mid left ventricular septum. Significant diffuse
delayed myocardial enhancement appears >15% of myocardial mass. ECV
36%, nonspecific elevated. T2 is 59 msec in area of maximal wall
thickness. Findings of >30 mm wall thickness and elevated T2 signal
suggest higher risk phenotype HCM.

2. Severely reduced LV systolic function by visual estimate, LVEF
30-35% with global hypokinesis.

2.  Mildly reduced RV function with normal RV size.

4. ECV value and inversion time myocardial nulling kinetics suggest
against a diagnosis of cardiac amyloidosis.

## 2022-02-23 ENCOUNTER — Encounter (HOSPITAL_COMMUNITY): Payer: Self-pay | Admitting: Hematology

## 2022-02-24 ENCOUNTER — Ambulatory Visit: Payer: Self-pay | Admitting: Family

## 2022-03-11 ENCOUNTER — Other Ambulatory Visit: Payer: Self-pay | Admitting: Internal Medicine

## 2022-03-11 DIAGNOSIS — F5101 Primary insomnia: Secondary | ICD-10-CM

## 2022-03-26 ENCOUNTER — Ambulatory Visit: Payer: Self-pay | Admitting: Internal Medicine

## 2022-04-07 ENCOUNTER — Ambulatory Visit: Payer: 59 | Admitting: Internal Medicine

## 2022-04-18 ENCOUNTER — Other Ambulatory Visit: Payer: Self-pay | Admitting: Nurse Practitioner

## 2022-05-17 ENCOUNTER — Other Ambulatory Visit: Payer: Self-pay | Admitting: Internal Medicine

## 2022-05-17 DIAGNOSIS — I5042 Chronic combined systolic (congestive) and diastolic (congestive) heart failure: Secondary | ICD-10-CM

## 2022-05-31 ENCOUNTER — Emergency Department (HOSPITAL_COMMUNITY): Payer: Medicaid Other

## 2022-05-31 ENCOUNTER — Encounter (HOSPITAL_COMMUNITY): Payer: Self-pay | Admitting: Emergency Medicine

## 2022-05-31 ENCOUNTER — Other Ambulatory Visit: Payer: Self-pay

## 2022-05-31 ENCOUNTER — Emergency Department (HOSPITAL_COMMUNITY)
Admission: EM | Admit: 2022-05-31 | Discharge: 2022-05-31 | Disposition: A | Discharge status: Home / Self Care | Payer: Medicaid Other | Attending: Emergency Medicine | Admitting: Emergency Medicine

## 2022-05-31 DIAGNOSIS — Z7982 Long term (current) use of aspirin: Secondary | ICD-10-CM | POA: Diagnosis not present

## 2022-05-31 DIAGNOSIS — I110 Hypertensive heart disease with heart failure: Secondary | ICD-10-CM | POA: Insufficient documentation

## 2022-05-31 DIAGNOSIS — Z79899 Other long term (current) drug therapy: Secondary | ICD-10-CM | POA: Diagnosis not present

## 2022-05-31 DIAGNOSIS — N289 Disorder of kidney and ureter, unspecified: Secondary | ICD-10-CM | POA: Diagnosis not present

## 2022-05-31 DIAGNOSIS — D649 Anemia, unspecified: Secondary | ICD-10-CM | POA: Diagnosis not present

## 2022-05-31 DIAGNOSIS — R0602 Shortness of breath: Secondary | ICD-10-CM | POA: Diagnosis present

## 2022-05-31 DIAGNOSIS — R590 Localized enlarged lymph nodes: Secondary | ICD-10-CM

## 2022-05-31 DIAGNOSIS — R791 Abnormal coagulation profile: Secondary | ICD-10-CM | POA: Insufficient documentation

## 2022-05-31 DIAGNOSIS — I10 Essential (primary) hypertension: Secondary | ICD-10-CM

## 2022-05-31 DIAGNOSIS — I5043 Acute on chronic combined systolic (congestive) and diastolic (congestive) heart failure: Secondary | ICD-10-CM | POA: Diagnosis not present

## 2022-05-31 DIAGNOSIS — Z21 Asymptomatic human immunodeficiency virus [HIV] infection status: Secondary | ICD-10-CM | POA: Insufficient documentation

## 2022-05-31 DIAGNOSIS — R778 Other specified abnormalities of plasma proteins: Secondary | ICD-10-CM | POA: Diagnosis not present

## 2022-05-31 DIAGNOSIS — I5042 Chronic combined systolic (congestive) and diastolic (congestive) heart failure: Secondary | ICD-10-CM

## 2022-05-31 LAB — BASIC METABOLIC PANEL
Anion gap: 7 (ref 5–15)
BUN: 36 mg/dL — ABNORMAL HIGH (ref 6–20)
CO2: 21 mmol/L — ABNORMAL LOW (ref 22–32)
Calcium: 8.6 mg/dL — ABNORMAL LOW (ref 8.9–10.3)
Chloride: 110 mmol/L (ref 98–111)
Creatinine, Ser: 1.36 mg/dL — ABNORMAL HIGH (ref 0.61–1.24)
GFR, Estimated: >60 mL/min (ref >60)
Glucose, Bld: 107 mg/dL — ABNORMAL HIGH (ref 70–99)
Potassium: 3.8 mmol/L (ref 3.5–5.1)
Sodium: 138 mmol/L (ref 135–145)

## 2022-05-31 LAB — D-DIMER, QUANTITATIVE: D-Dimer, Quant: 1.14 ug/mL-FEU — ABNORMAL HIGH (ref 0.00–0.50)

## 2022-05-31 LAB — CBC WITH DIFFERENTIAL/PLATELET
Abs Immature Granulocytes: 0.02 10*3/uL (ref 0.00–0.07)
Basophils Absolute: 0 10*3/uL (ref 0.0–0.1)
Basophils Relative: 0 %
Eosinophils Absolute: 0.1 10*3/uL (ref 0.0–0.5)
Eosinophils Relative: 1 %
HCT: 34.5 % — ABNORMAL LOW (ref 39.0–52.0)
Hemoglobin: 11.4 g/dL — ABNORMAL LOW (ref 13.0–17.0)
Immature Granulocytes: 0 %
Lymphocytes Relative: 25 %
Lymphs Abs: 1.7 10*3/uL (ref 0.7–4.0)
MCH: 30.1 pg (ref 26.0–34.0)
MCHC: 33 g/dL (ref 30.0–36.0)
MCV: 91 fL (ref 80.0–100.0)
Monocytes Absolute: 0.4 10*3/uL (ref 0.1–1.0)
Monocytes Relative: 5 %
Neutro Abs: 4.5 10*3/uL (ref 1.7–7.7)
Neutrophils Relative %: 69 %
Platelets: 179 10*3/uL (ref 150–400)
RBC: 3.79 MIL/uL — ABNORMAL LOW (ref 4.22–5.81)
RDW: 16.7 % — ABNORMAL HIGH (ref 11.5–15.5)
WBC: 6.7 10*3/uL (ref 4.0–10.5)
nRBC: 0 % (ref 0.0–0.2)

## 2022-05-31 LAB — TROPONIN I (HIGH SENSITIVITY): Troponin I (High Sensitivity): 128 ng/L (ref <18)

## 2022-05-31 LAB — BRAIN NATRIURETIC PEPTIDE: B Natriuretic Peptide: >4500 pg/mL — ABNORMAL HIGH (ref 0.0–100.0)

## 2022-05-31 MED ORDER — NITROGLYCERIN 2 % TD OINT
1.0000 [in_us] | TOPICAL_OINTMENT | Freq: Once | TRANSDERMAL | Status: AC
  Administered 2022-05-31: 1 [in_us] via TOPICAL
  Filled 2022-05-31: qty 1

## 2022-05-31 MED ORDER — ALBUTEROL SULFATE HFA 108 (90 BASE) MCG/ACT IN AERS
2.0000 | INHALATION_SPRAY | RESPIRATORY_TRACT | Status: DC | PRN
  Administered 2022-05-31: 2 via RESPIRATORY_TRACT
  Filled 2022-05-31: qty 6.7

## 2022-05-31 MED ORDER — LOSARTAN POTASSIUM 50 MG PO TABS: 50.0000 mg | ORAL_TABLET | Freq: Every day | ORAL | 1 refills | Status: DC

## 2022-05-31 MED ORDER — FUROSEMIDE 40 MG PO TABS: 40.0000 mg | ORAL_TABLET | Freq: Every day | ORAL | 1 refills | Status: DC | PRN

## 2022-05-31 MED ORDER — FUROSEMIDE 10 MG/ML IJ SOLN
40.0000 mg | Freq: Once | INTRAMUSCULAR | Status: AC
  Administered 2022-05-31: 40 mg via INTRAVENOUS
  Filled 2022-05-31: qty 4

## 2022-05-31 MED ORDER — IOHEXOL 350 MG/ML SOLN
75.0000 mL | Freq: Once | INTRAVENOUS | Status: AC | PRN
  Administered 2022-05-31: 75 mL via INTRAVENOUS

## 2022-05-31 MED ORDER — CARVEDILOL 12.5 MG PO TABS
12.5000 mg | ORAL_TABLET | Freq: Once | ORAL | Status: AC
  Administered 2022-05-31: 12.5 mg via ORAL
  Filled 2022-05-31: qty 1

## 2022-05-31 MED ORDER — HYDRALAZINE HCL 25 MG PO TABS
50.0000 mg | ORAL_TABLET | Freq: Once | ORAL | Status: AC
  Administered 2022-05-31: 50 mg via ORAL
  Filled 2022-05-31: qty 2

## 2022-05-31 MED ORDER — LOSARTAN POTASSIUM 25 MG PO TABS
50.0000 mg | ORAL_TABLET | Freq: Once | ORAL | Status: AC
  Administered 2022-05-31: 50 mg via ORAL
  Filled 2022-05-31: qty 2

## 2022-05-31 MED ORDER — ASPIRIN 81 MG PO CHEW
324.0000 mg | CHEWABLE_TABLET | Freq: Once | ORAL | Status: AC
  Administered 2022-05-31: 324 mg via ORAL
  Filled 2022-05-31: qty 4

## 2022-05-31 MED ORDER — HEPARIN SOD (PORK) LOCK FLUSH 100 UNIT/ML IV SOLN
INTRAVENOUS | Status: AC
  Filled 2022-05-31: qty 5

## 2022-05-31 NOTE — ED Provider Notes (Signed)
7:55 AM Care transferred to me.  Presentation is most consistent with CHF.  CT images viewed by myself which show edema but no PE.  He was made aware of the increased size of lymph nodes and will need follow-up.  Otherwise, he is feeling a ton better and has filled up a couple urinals with diuresis.  As per prior plan, will get second troponin, ambulate and he will likely be able to go home.  9:14 AM Patient ambulated without difficulty or hypoxia.  He would like to go home which I think is not unreasonable.  We will represcribe his Lasix and losartan.  He has his other meds.  Will be given his morning meds of Coreg and hydralazine here given hypertension.  Otherwise, will give cardiology and oncology referral and he was made aware of the lymphadenopathy.  Second troponin is elevated but my suspicion is these elevated troponins are from CHF rather than ACS.  Given return precautions.   Sherwood Gambler, MD 05/31/22 [phone]

## 2022-05-31 NOTE — ED Triage Notes (Signed)
Pt states he is having SOB. C/o pain L rib area. States he has hx of CHF and Lymphoma.

## 2022-05-31 NOTE — ED Notes (Signed)
Ambulated the patient around the unit.  Patient maintained at 98% on RA.  Provider made aware.

## 2022-05-31 NOTE — ED Provider Notes (Signed)
[facility] EMERGENCY DEPARTMENT Provider Note   CSN: [id number] Arrival date & time: 05/31/22  0451     History  Chief Complaint  Patient presents with   Shortness of Breath    Richard Cox is a 41 y.o. male.  The history is provided by the patient.  Shortness of Breath He has history of hypertension, combined systolic and diastolic heart failure, high-grade B-cell lymphoma, HIV disease and comes in because of shortness of breath for the last 3 days.  He also developed a pain in the left rib cage today which has resolved following a dose of acetaminophen.  He has had a cough but has difficulty describing the sputum.  He denies fever, chills, sweats.  He denies any nausea or vomiting and denies any diaphoresis.  Of note, he ran out of his losartan about 1 week ago, and ran out of his furosemide about 3 days ago.  He has noted some slight swelling in his ankles.   Home Medications Prior to Admission medications   Medication Sig Start Date End Date Taking? Authorizing Provider  acetaminophen (TYLENOL) 500 MG tablet Take 1,000 mg by mouth every 6 (six) hours as needed for moderate pain or headache.    [provider]  aspirin 81 MG chewable tablet Chew 1 tablet (81 mg total) by mouth daily. 08/28/21   Geradine Girt, DO  bictegravir-emtricitabine-tenofovir AF (BIKTARVY) 50-200-25 MG TABS tablet Take 1 tablet by mouth daily. 12/24/21   Golden Circle, FNP  carvedilol (COREG) 12.5 MG tablet Take 1 tablet (12.5 mg total) by mouth 2 (two) times daily with a meal. 12/31/21   Lindell Spar, MD  furosemide (LASIX) 40 MG tablet Take 1 tablet (40 mg total) by mouth daily as needed for fluid. 12/31/21 04/30/22  Lindell Spar, MD  hydrALAZINE (APRESOLINE) 50 MG tablet Take 1 tablet (50 mg total) by mouth 3 (three) times daily. 10/06/21   Lindell Spar, MD  hydrOXYzine (ATARAX) 25 MG tablet TAKE 1 TABLET BY MOUTH AT BEDTIME AS NEEDED FOR ANXIETY AND ISOMNIA 03/11/22   Lindell Spar, MD   losartan (COZAAR) 50 MG tablet Take 1 tablet (50 mg total) by mouth daily. 12/31/21   Lindell Spar, MD  potassium chloride SA (KLOR-CON) 20 MEQ tablet Take 0.5 tablets (10 mEq total) by mouth daily as needed (when taking lasix). 08/27/21   Geradine Girt, DO      Allergies    Neosporin [neomycin-bacitracin zn-polymyx]    Review of Systems   Review of Systems  Respiratory:  Positive for shortness of breath.   All other systems reviewed and are negative.   Physical Exam Updated Vital Signs BP (!) 192/106 (BP Location: Left Arm)   Pulse 96   Temp (!) 97.5 F (36.4 C) (Oral)   Resp (!) 28   Ht '6\' 1"'$  (1.854 m)   Wt 77.1 kg   SpO2 97%   BMI 22.43 kg/m  Physical Exam Vitals and nursing note reviewed.   41 year old male, resting comfortably and in no acute distress. Vital signs are significant for elevated respiratory rate and blood pressure. Oxygen saturation is 97%, which is normal. Head is normocephalic and atraumatic. PERRLA, EOMI. Oropharynx is clear. Neck is nontender and supple without adenopathy or JVD.  Hepatojugular reflux is present. Back is nontender and there is no CVA tenderness. Lungs have bibasilar rales about one third of the way up.  There are no wheezes or rhonchi. Chest is nontender.  Heart has regular rate and rhythm without murmur. Abdomen is soft, flat, nontender. Extremities have no cyanosis or edema, full range of motion is present. Skin is warm and dry without rash. Neurologic: Mental status is normal, cranial nerves are intact, moves all extremities equally.  ED Results / Procedures / Treatments   Labs (all labs ordered are listed, but only abnormal results are displayed) Labs Reviewed  BRAIN NATRIURETIC PEPTIDE - Abnormal; Notable for the following components:      Result Value   B Natriuretic Peptide >4,500.0 (*)    All other components within normal limits  BASIC METABOLIC PANEL - Abnormal; Notable for the following components:   CO2 21 (*)     Glucose, Bld 107 (*)    BUN 36 (*)    Creatinine, Ser 1.36 (*)    Calcium 8.6 (*)    All other components within normal limits  D-DIMER, QUANTITATIVE - Abnormal; Notable for the following components:   D-Dimer, Quant 1.14 (*)    All other components within normal limits  CBC WITH DIFFERENTIAL/PLATELET - Abnormal; Notable for the following components:   RBC 3.79 (*)    Hemoglobin 11.4 (*)    HCT 34.5 (*)    RDW 16.7 (*)    All other components within normal limits  TROPONIN I (HIGH SENSITIVITY) - Abnormal; Notable for the following components:   Troponin I (High Sensitivity) 158 (*)    All other components within normal limits  TROPONIN I (HIGH SENSITIVITY)    EKG EKG Interpretation  Date/Time:  Sunday May 31 2022 05:01:38 EDT Ventricular Rate:  94 PR Interval:  175 QRS Duration: 97 QT Interval:  384 QTC Calculation: 481 R Axis:   67 Text Interpretation: Sinus rhythm Biatrial enlargement Left ventricular hypertrophy Nonspecific T abnormalities, diffuse leads Anterior ST elevation, probably due to LVH Borderline prolonged QT interval When compared with ECG of 08/24/2021, QT has shortened Confirmed by Delora Fuel ([id number]) on 05/31/2022 5:15:00 AM  Radiology DG Chest Portable 1 View  Result Date: 05/31/2022 CLINICAL DATA:  Shortness of breath. EXAM: PORTABLE CHEST 1 VIEW COMPARISON:  08/24/2021 FINDINGS: The cardio pericardial silhouette is enlarged. There is pulmonary vascular congestion with probable interstitial edema. Subtle patchy airspace disease noted left mid lung. Right Port-A-Cath again noted. Telemetry leads overlie the chest. IMPRESSION: Subtle patchy airspace disease left mid lung. Pneumonia not excluded. Cardiomegaly with vascular congestion and probable interstitial pulmonary edema. Electronically Signed   By: Misty Stanley M.D.   On: 05/31/2022 05:25    Procedures Procedures  Cardiac monitor shows normal sinus rhythm, per my interpretation.  Medications Ordered  in ED Medications  albuterol (VENTOLIN HFA) 108 (90 Base) MCG/ACT inhaler 2 puff (has no administration in time range)    ED Course/ Medical Decision Making/ A&P                           Medical Decision Making Amount and/or Complexity of Data Reviewed Labs: ordered. Radiology: ordered.  Risk OTC drugs. Prescription drug management.   Shortness of breath which seems most likely to be heart failure exacerbation.  Physical exam is consistent with this and dyspnea coincided with when he ran out of his furosemide.  Differential diagnosis does include pulmonary embolism, pneumonia, pneumothorax, pleural effusion.  Old records are reviewed, and it appears that he is in remission from his B-cell lymphoma noted in oncology office visit of 07/01/2021.  Last HIV titer was not detectable on 10/17/2021.  On that day, CD4 count was slightly low at 271.  Because of low CD4 count, he is at risk for developing opportunistic pneumonia.  Also, with history of lymphoma, he is at increased risk for thromboembolism.  I have reviewed and interpreted his ECG, and my interpretation is left ventricular hypertrophy with secondary repolarization changes unchanged from prior.  Borderline QT prolongation with QTc having decreased from most recent ECG.  I have ordered chest x-ray, CBC, basic metabolic panel, troponin, BNP, D-dimer.  Chest x-ray shows cardiomegaly and pulmonary vascular congestion consistent with heart failure.  Have independently viewed the image, and agree with radiologist's interpretation.  I have reviewed and interpreted the laboratory tests and my interpretation is stable anemia, stable renal insufficiency, markedly elevated BNP consistent with heart failure exacerbation, mildly elevated troponin which is likely secondary to demand ischemia, but will need repeat troponin.  For now, I am holding off on anticoagulation.  D-dimer has also come back elevated, I have ordered a CT angiogram of the chest to rule  out pulmonary embolism.  I have also given him a dose of intravenous furosemide and started him on topical nitroglycerin.  Case is signed out to Dr. Regenia Skeeter.  Final Clinical Impression(s) / ED Diagnoses Final diagnoses:  Acute on chronic combined systolic and diastolic heart failure (HCC)  Elevated d-dimer  Renal insufficiency  Normochromic normocytic anemia  Elevated troponin    Rx / DC Orders ED Discharge Orders     None         Delora Fuel, MD 07/62/26 [phone]

## 2022-05-31 NOTE — Discharge Instructions (Addendum)
It is important to follow-up with a primary care physician and cardiology.  Of note, your CT scan showed enlarged lymph nodes in your chest.  You will need to follow-up with an oncologist.  If you develop new or worsening shortness of breath, chest pain, swelling, fever, cough, or any other new/concerning symptoms then return to the ER for evaluation.

## 2022-06-08 LAB — TROPONIN I (HIGH SENSITIVITY): Troponin I (High Sensitivity): 158 ng/L (ref <18)

## 2022-06-12 ENCOUNTER — Other Ambulatory Visit: Payer: Self-pay | Admitting: Family

## 2022-06-12 DIAGNOSIS — B20 Human immunodeficiency virus [HIV] disease: Secondary | ICD-10-CM

## 2022-07-09 ENCOUNTER — Other Ambulatory Visit: Payer: Self-pay | Admitting: Family

## 2022-07-09 DIAGNOSIS — B20 Human immunodeficiency virus [HIV] disease: Secondary | ICD-10-CM

## 2022-07-09 NOTE — Telephone Encounter (Signed)
Patient overdue for appointment, medication last dispensed 09/2021.  Beryle Flock, RN

## 2022-07-30 ENCOUNTER — Other Ambulatory Visit: Payer: Self-pay

## 2022-07-30 DIAGNOSIS — B20 Human immunodeficiency virus [HIV] disease: Secondary | ICD-10-CM

## 2022-07-30 MED ORDER — BIKTARVY 50-200-25 MG PO TABS: 1.0000 | ORAL_TABLET | Freq: Every day | ORAL | 0 refills | Status: DC

## 2022-08-01 ENCOUNTER — Emergency Department (HOSPITAL_COMMUNITY): Payer: Medicaid Other

## 2022-08-01 ENCOUNTER — Other Ambulatory Visit: Payer: Self-pay

## 2022-08-01 ENCOUNTER — Emergency Department (HOSPITAL_COMMUNITY)
Admission: EM | Admit: 2022-08-01 | Discharge: 2022-08-01 | Discharge status: Against Medical Advice | Payer: Medicaid Other | Attending: Emergency Medicine | Admitting: Emergency Medicine

## 2022-08-01 ENCOUNTER — Encounter (HOSPITAL_COMMUNITY): Payer: Self-pay | Admitting: Emergency Medicine

## 2022-08-01 DIAGNOSIS — Z79899 Other long term (current) drug therapy: Secondary | ICD-10-CM | POA: Diagnosis not present

## 2022-08-01 DIAGNOSIS — R059 Cough, unspecified: Secondary | ICD-10-CM | POA: Diagnosis not present

## 2022-08-01 DIAGNOSIS — I110 Hypertensive heart disease with heart failure: Secondary | ICD-10-CM | POA: Diagnosis not present

## 2022-08-01 DIAGNOSIS — R0789 Other chest pain: Secondary | ICD-10-CM | POA: Insufficient documentation

## 2022-08-01 DIAGNOSIS — Z7982 Long term (current) use of aspirin: Secondary | ICD-10-CM | POA: Insufficient documentation

## 2022-08-01 DIAGNOSIS — Z91148 Patient's other noncompliance with medication regimen for other reason: Secondary | ICD-10-CM

## 2022-08-01 DIAGNOSIS — I10 Essential (primary) hypertension: Secondary | ICD-10-CM

## 2022-08-01 DIAGNOSIS — R0602 Shortness of breath: Secondary | ICD-10-CM | POA: Diagnosis present

## 2022-08-01 DIAGNOSIS — Z21 Asymptomatic human immunodeficiency virus [HIV] infection status: Secondary | ICD-10-CM | POA: Insufficient documentation

## 2022-08-01 DIAGNOSIS — R778 Other specified abnormalities of plasma proteins: Secondary | ICD-10-CM | POA: Insufficient documentation

## 2022-08-01 DIAGNOSIS — Z8572 Personal history of non-Hodgkin lymphomas: Secondary | ICD-10-CM | POA: Diagnosis not present

## 2022-08-01 DIAGNOSIS — R000 Tachycardia, unspecified: Secondary | ICD-10-CM | POA: Diagnosis not present

## 2022-08-01 DIAGNOSIS — I509 Heart failure, unspecified: Secondary | ICD-10-CM | POA: Insufficient documentation

## 2022-08-01 DIAGNOSIS — I5042 Chronic combined systolic (congestive) and diastolic (congestive) heart failure: Secondary | ICD-10-CM

## 2022-08-01 LAB — CBC WITH DIFFERENTIAL/PLATELET
Abs Immature Granulocytes: 0.01 10*3/uL (ref 0.00–0.07)
Basophils Absolute: 0 10*3/uL (ref 0.0–0.1)
Basophils Relative: 0 %
Eosinophils Absolute: 0.1 10*3/uL (ref 0.0–0.5)
Eosinophils Relative: 2 %
HCT: 40.4 % (ref 39.0–52.0)
Hemoglobin: 13.1 g/dL (ref 13.0–17.0)
Immature Granulocytes: 0 %
Lymphocytes Relative: 25 %
Lymphs Abs: 1.9 10*3/uL (ref 0.7–4.0)
MCH: 29.4 pg (ref 26.0–34.0)
MCHC: 32.4 g/dL (ref 30.0–36.0)
MCV: 90.8 fL (ref 80.0–100.0)
Monocytes Absolute: 0.4 10*3/uL (ref 0.1–1.0)
Monocytes Relative: 5 %
Neutro Abs: 5.2 10*3/uL (ref 1.7–7.7)
Neutrophils Relative %: 68 %
Platelets: 164 10*3/uL (ref 150–400)
RBC: 4.45 MIL/uL (ref 4.22–5.81)
RDW: 16.9 % — ABNORMAL HIGH (ref 11.5–15.5)
WBC: 7.6 10*3/uL (ref 4.0–10.5)
nRBC: 0 % (ref 0.0–0.2)

## 2022-08-01 LAB — COMPREHENSIVE METABOLIC PANEL
ALT: 37 U/L (ref 0–44)
AST: 60 U/L — ABNORMAL HIGH (ref 15–41)
Albumin: 3.9 g/dL (ref 3.5–5.0)
Alkaline Phosphatase: 108 U/L (ref 38–126)
Anion gap: 9 (ref 5–15)
BUN: 26 mg/dL — ABNORMAL HIGH (ref 6–20)
CO2: 23 mmol/L (ref 22–32)
Calcium: 9.1 mg/dL (ref 8.9–10.3)
Chloride: 112 mmol/L — ABNORMAL HIGH (ref 98–111)
Creatinine, Ser: 1.32 mg/dL — ABNORMAL HIGH (ref 0.61–1.24)
GFR, Estimated: >60 mL/min (ref >60)
Glucose, Bld: 111 mg/dL — ABNORMAL HIGH (ref 70–99)
Potassium: 3.9 mmol/L (ref 3.5–5.1)
Sodium: 144 mmol/L (ref 135–145)
Total Bilirubin: 0.8 mg/dL (ref 0.3–1.2)
Total Protein: 7.5 g/dL (ref 6.5–8.1)

## 2022-08-01 LAB — TROPONIN I (HIGH SENSITIVITY)
Troponin I (High Sensitivity): 164 ng/L (ref <18)
Troponin I (High Sensitivity): 145 ng/L (ref <18)

## 2022-08-01 LAB — BRAIN NATRIURETIC PEPTIDE: B Natriuretic Peptide: >4500 pg/mL — ABNORMAL HIGH (ref 0.0–100.0)

## 2022-08-01 MED ORDER — FUROSEMIDE 40 MG PO TABS: 40.0000 mg | ORAL_TABLET | Freq: Every day | ORAL | 1 refills | Status: DC | PRN

## 2022-08-01 MED ORDER — HYDRALAZINE HCL 20 MG/ML IJ SOLN
20.0000 mg | Freq: Once | INTRAMUSCULAR | Status: AC
  Administered 2022-08-01: 20 mg via INTRAVENOUS
  Filled 2022-08-01: qty 1

## 2022-08-01 MED ORDER — FUROSEMIDE 10 MG/ML IJ SOLN
40.0000 mg | Freq: Once | INTRAMUSCULAR | Status: AC
  Administered 2022-08-01: 40 mg via INTRAVENOUS
  Filled 2022-08-01: qty 4

## 2022-08-01 MED ORDER — HYDRALAZINE HCL 50 MG PO TABS: 50.0000 mg | ORAL_TABLET | Freq: Three times a day (TID) | ORAL | 5 refills | Status: DC

## 2022-08-01 NOTE — ED Provider Notes (Signed)
[facility] EMERGENCY DEPARTMENT Provider Note   CSN: [id number] Arrival date & time: 08/01/22  6073     History {Add pertinent medical, surgical, social history, OB history to HPI:1} Chief Complaint  Patient presents with  . Shortness of Breath    Richard Cox is a 41 y.o. male.   Shortness of Breath Patient presents with shortness of breath and chest pain.  Has had for the last couple days.  History of cardiomyopathy.  Has been off of what sounds like his Lasix and hydralazine for the last couple days.  Now having chest pain and shortness of breath.  For EMS had sats of 90%.  Occasional cough that is nonproductive.  No known sick contacts.  Pain is dull.    Past Medical History:  Diagnosis Date  . Acute respiratory failure with hypoxia ([place]) 08/24/2021  . CHF (congestive heart failure) ([place])   . Endotracheally intubated 08/24/2021  . Essential hypertension   . HIV infection ([place])   . Lymphoma ([place]) 08/28/2020  . Noncompliance with medication regimen   . Secondary cardiomyopathy ([place])     Home Medications Prior to Admission medications   Medication Sig Start Date End Date Taking? Authorizing Provider  acetaminophen (TYLENOL) 500 MG tablet Take 1,000 mg by mouth every 6 (six) hours as needed for moderate pain or headache.    [provider]  aspirin 81 MG chewable tablet Chew 1 tablet (81 mg total) by mouth daily. 08/28/21   Geradine Girt, DO  bictegravir-emtricitabine-tenofovir AF (BIKTARVY) 50-200-25 MG TABS tablet Take 1 tablet by mouth daily. 07/30/22   Golden Circle, FNP  carvedilol (COREG) 12.5 MG tablet Take 1 tablet (12.5 mg total) by mouth 2 (two) times daily with a meal. 12/31/21   Lindell Spar, MD  furosemide (LASIX) 40 MG tablet Take 1 tablet (40 mg total) by mouth daily as needed for fluid. 05/31/22 09/28/22  Sherwood Gambler, MD  hydrALAZINE (APRESOLINE) 50 MG tablet Take 1 tablet (50 mg total) by mouth 3 (three) times daily. 10/06/21   Lindell Spar, MD  hydrOXYzine (ATARAX) 25 MG tablet TAKE 1 TABLET BY MOUTH AT BEDTIME AS NEEDED FOR ANXIETY AND ISOMNIA 03/11/22   Lindell Spar, MD  losartan (COZAAR) 50 MG tablet Take 1 tablet (50 mg total) by mouth daily. 05/31/22   Sherwood Gambler, MD  potassium chloride SA (KLOR-CON) 20 MEQ tablet Take 0.5 tablets (10 mEq total) by mouth daily as needed (when taking lasix). 08/27/21   Geradine Girt, DO      Allergies    Neosporin [neomycin-bacitracin zn-polymyx]    Review of Systems   Review of Systems  Respiratory:  Positive for shortness of breath.     Physical Exam Updated Vital Signs BP (S) (!) 196/139 (BP Location: Right Arm)   Pulse (!) 101 Comment: out of B/P medication x1 week  Temp 97.9 F (36.6 C) (Oral)   Resp (!) 24   Ht '6\' 1"'$  (1.854 m)   Wt 77.1 kg   SpO2 90%   BMI 22.43 kg/m  Physical Exam Vitals and nursing note reviewed.  HENT:     Head: Atraumatic.  Cardiovascular:     Rate and Rhythm: Regular rhythm. Tachycardia present.  Pulmonary:     Comments: Harsh breath sounds bilateral bases with some rales. Chest:     Chest wall: Tenderness present.     Comments: Some tenderness to anterior chest wall. Musculoskeletal:     Right lower leg: No edema.  Left lower leg: No edema.  Skin:    Capillary Refill: Capillary refill takes less than 2 seconds.  Neurological:     Mental Status: He is alert and oriented to person, place, and time.    ED Results / Procedures / Treatments   Labs (all labs ordered are listed, but only abnormal results are displayed) Labs Reviewed  COMPREHENSIVE METABOLIC PANEL - Abnormal; Notable for the following components:      Result Value   Chloride 112 (*)    Glucose, Bld 111 (*)    BUN 26 (*)    Creatinine, Ser 1.32 (*)    AST 60 (*)    All other components within normal limits  CBC WITH DIFFERENTIAL/PLATELET - Abnormal; Notable for the following components:   RDW 16.9 (*)    All other components within normal limits   TROPONIN I (HIGH SENSITIVITY) - Abnormal; Notable for the following components:   Troponin I (High Sensitivity) 164 (*)    All other components within normal limits  BRAIN NATRIURETIC PEPTIDE    EKG EKG Interpretation  Date/Time:  Saturday August 01 2022 07:43:28 EDT Ventricular Rate:  102 PR Interval:  160 QRS Duration: 100 QT Interval:  380 QTC Calculation: 495 R Axis:   67 Text Interpretation: Sinus tachycardia Biatrial enlargement Left ventricular hypertrophy ( Cornell product ) ST & T wave abnormality, consider inferolateral ischemia Abnormal ECG When compared with ECG of 31-May-2022 05:01, No significant change since last tracing Confirmed by Davonna Belling [phone]) on 08/01/2022 7:57:43 AM  Radiology DG Chest 2 View  Result Date: 08/01/2022 CLINICAL DATA:  41 year old male with history of shortness of breath and chest pain. EXAM: CHEST - 2 VIEW COMPARISON:  Chest x-ray 05/31/2022. FINDINGS: Right internal jugular single-lumen porta cath with tip terminating in the mid superior vena cava. There is cephalization of the pulmonary vasculature and slight indistinctness of the interstitial markings suggestive of mild pulmonary edema. Trace bilateral pleural effusions. Mild cardiomegaly. Upper mediastinal contours are within normal limits. IMPRESSION: 1. The appearance of the chest suggests mild congestive heart failure, as above. Electronically Signed   By: Vinnie Langton M.D.   On: 08/01/2022 08:54    Procedures Procedures  {Document cardiac monitor, telemetry assessment procedure when appropriate:1}  Medications Ordered in ED Medications - No data to display  ED Course/ Medical Decision Making/ A&P                           Medical Decision Making Amount and/or Complexity of Data Reviewed Labs: ordered. Radiology: ordered.    patient with shortness of breath and chest pain.  Somewhat severe hypertension.  Has been noncompliant with his medication.  Differential  diagnosis includes CHF, hypertensive emergency, pneumonia.  Chest x-ray independently interpreted and shows likely some volume overload.  No pneumonia.  Has chest pain and EKG is stable to prior.  Troponin is elevated and slightly higher than before but does appear to be somewhat chronically elevated.  Will require admission to the hospital and IV blood pressure control.  History of hypertrophic cardiomyopathy.  Will discuss with hospitalist  CRITICAL CARE Performed by: Davonna Belling Total critical care time: 30 minutes Critical care time was exclusive of separately billable procedures and treating other patients. Critical care was necessary to treat or prevent imminent or life-threatening deterioration. Critical care was time spent personally by me on the following activities: development of treatment plan with patient and/or surrogate as well as nursing, discussions  with consultants, evaluation of patient's response to treatment, examination of patient, obtaining history from patient or surrogate, ordering and performing treatments and interventions, ordering and review of laboratory studies, ordering and review of radiographic studies, pulse oximetry and re-evaluation of patient's condition.   {Document critical care time when appropriate:1} {Document review of labs and clinical decision tools ie heart score, Chads2Vasc2 etc:1}  {Document your independent review of radiology images, and any outside records:1} {Document your discussion with family members, caretakers, and with consultants:1} {Document social determinants of health affecting pt's care:1} {Document your decision making why or why not admission, treatments were needed:1} Final Clinical Impression(s) / ED Diagnoses Final diagnoses:  None    Rx / DC Orders ED Discharge Orders     None

## 2022-08-01 NOTE — Discharge Instructions (Signed)
You are leaving against our advice.  Follow-up with your doctors.  Continue to take your medicines

## 2022-08-01 NOTE — ED Triage Notes (Signed)
Patient c/o shortness of breath that started yesterday and progressively getting worse. Per patient dry cough. Patient believes he did have a fever 2 nights ago but has been taking tylenol since. Denies any chest pain. Per patient hx of CHF and ran out of Lasix yesterday. Per patient slight swelling in lower legs but no weight gain. Patient also has hx of lymphoma in which chemo with last treatment 1.5 year ago. Per patient has not had follow-up scan in a year.

## 2022-08-06 ENCOUNTER — Telehealth: Payer: Self-pay | Admitting: Family

## 2022-08-06 NOTE — Telephone Encounter (Signed)
Pt requested a call regarding his medication. Pt stated he has been out of medication for a few days and is aware of his upcoming appt. He hopes to receive a refill or samples.

## 2022-08-06 NOTE — Telephone Encounter (Signed)
Attempted to call patient with update on refill. Not able to reach him at this time. Leatrice Jewels, RMA

## 2022-08-10 ENCOUNTER — Ambulatory Visit: Payer: Self-pay | Admitting: Family

## 2022-08-10 ENCOUNTER — Telehealth: Payer: Self-pay

## 2022-08-10 NOTE — Progress Notes (Deleted)
Brief Narrative   Patient ID: Richard Cox, male    DOB: [birth year], 41 y.o.   MRN: [id number]  Richard Cox is a 41 y/o  AA male diagnosed with HIV in May of 2020 with risk factor for acquiring HIV including MSM. No history of opportunistic infection or acute retroviral syndrome. Initial blood work for entry to care with CD4 of 188 and viral load of 65,200 (CDC Stage 3). Genotype was wild with no significant resistance. Sole medication regimen of Biktarvy.   Subjective:    No chief complaint on file.   HPI:  Richard Cox is a 41 y.o. male with HIV disease last seen on 10/17/21 with well controlled virus and good adherence and tolerance to [place].  Viral load was undetectable with CD4 count of 271.  Recently seen in the [facility] emergency department with shortness of breath and chest pain refusing admission at that time with hypertension and elevated troponin levels and diagnosis of exacerbation of heart failure..  Of note BNP was greater than 4500.  Discharged with recommend follow-up with cardiology as soon as possible.  This discharge was AMA.  Here today for follow-up.     Allergies  Allergen Reactions   Neosporin [Neomycin-Bacitracin Zn-Polymyx] Rash      Outpatient Medications Prior to Visit  Medication Sig Dispense Refill   acetaminophen (TYLENOL) 500 MG tablet Take 1,000 mg by mouth every 6 (six) hours as needed for moderate pain or headache.     aspirin 81 MG chewable tablet Chew 1 tablet (81 mg total) by mouth daily. 30 tablet 0   bictegravir-emtricitabine-tenofovir AF (BIKTARVY) 50-200-25 MG TABS tablet Take 1 tablet by mouth daily. 30 tablet 0   carvedilol (COREG) 12.5 MG tablet Take 1 tablet (12.5 mg total) by mouth 2 (two) times daily with a meal. 180 tablet 1   furosemide (LASIX) 40 MG tablet Take 1 tablet (40 mg total) by mouth daily as needed for fluid. 30 tablet 1   hydrALAZINE (APRESOLINE) 50 MG tablet Take 1 tablet (50 mg total) by mouth 3 (three) times daily. 90  tablet 5   hydrOXYzine (ATARAX) 25 MG tablet TAKE 1 TABLET BY MOUTH AT BEDTIME AS NEEDED FOR ANXIETY AND ISOMNIA 30 tablet 0   losartan (COZAAR) 50 MG tablet Take 1 tablet (50 mg total) by mouth daily. 30 tablet 1   potassium chloride SA (KLOR-CON) 20 MEQ tablet Take 0.5 tablets (10 mEq total) by mouth daily as needed (when taking lasix). 30 tablet 1   Facility-Administered Medications Prior to Visit  Medication Dose Route Frequency Provider Last Rate Last Admin   0.9 %  sodium chloride infusion   Intravenous Continuous Derek Jack, MD       0.9 %  sodium chloride infusion   Intravenous Continuous Derek Jack, MD   Stopped at 09/26/20 1124   0.9 %  sodium chloride infusion   Intravenous Continuous Derek Jack, MD 500 mL/hr at 11/04/20 0935 Bolus from Bag at 11/04/20 0935   sodium chloride flush (NS) 0.9 % injection 10 mL  10 mL Intravenous PRN Derek Jack, MD   10 mL at 10/15/20 1035   sodium chloride flush (NS) 0.9 % injection 10 mL  10 mL Intravenous PRN Derek Jack, MD   10 mL at 11/07/20 1046     Past Medical History:  Diagnosis Date   Acute respiratory failure with hypoxia ([place]) 08/24/2021   CHF (congestive heart failure) ([place])    Endotracheally intubated 08/24/2021   Essential  hypertension    HIV infection ([place])    Lymphoma ([place]) 08/28/2020   Noncompliance with medication regimen    Secondary cardiomyopathy [facility])      Past Surgical History:  Procedure Laterality Date   IR IMAGING GUIDED PORT INSERTION  08/23/2020   Right   NO PAST SURGERIES        Review of Systems  Constitutional:  Negative for appetite change, chills, fatigue, fever and unexpected weight change.  Eyes:  Negative for visual disturbance.  Respiratory:  Negative for cough, chest tightness, shortness of breath and wheezing.   Cardiovascular:  Negative for chest pain and leg swelling.  Gastrointestinal:  Negative for abdominal pain, constipation, diarrhea,  nausea and vomiting.  Genitourinary:  Negative for dysuria, flank pain, frequency, genital sores, hematuria and urgency.  Skin:  Negative for rash.  Allergic/Immunologic: Negative for immunocompromised state.  Neurological:  Negative for dizziness and headaches.      Objective:    There were no vitals taken for this visit. Nursing note and vital signs reviewed.  Physical Exam Constitutional:      General: He is not in acute distress.    Appearance: He is well-developed.  Eyes:     Conjunctiva/sclera: Conjunctivae normal.  Cardiovascular:     Rate and Rhythm: Normal rate and regular rhythm.     Heart sounds: Normal heart sounds. No murmur heard.    No friction rub. No gallop.  Pulmonary:     Effort: Pulmonary effort is normal. No respiratory distress.     Breath sounds: Normal breath sounds. No wheezing or rales.  Chest:     Chest wall: No tenderness.  Abdominal:     General: Bowel sounds are normal.     Palpations: Abdomen is soft.     Tenderness: There is no abdominal tenderness.  Musculoskeletal:     Cervical back: Neck supple.  Lymphadenopathy:     Cervical: No cervical adenopathy.  Skin:    General: Skin is warm and dry.     Findings: No rash.  Neurological:     Mental Status: He is alert and oriented to person, place, and time.  Psychiatric:        Behavior: Behavior normal.        Thought Content: Thought content normal.        Judgment: Judgment normal.         10/17/2021    8:47 AM 10/06/2021    8:32 AM 09/10/2021    1:21 PM 01/27/2021    9:13 AM 09/10/2020   11:00 AM  Depression screen PHQ 2/9  Decreased Interest 0 0 0 0   Down, Depressed, Hopeless 0 2 0 0 1  PHQ - 2 Score 0 2 0 0 1  Altered sleeping 0 3   0  Tired, decreased energy 0 3   0  Change in appetite 0 3   0  Feeling bad or failure about yourself  0 0   0  Trouble concentrating 0 0   0  Moving slowly or fidgety/restless 0 0   0  Suicidal thoughts 0 0   0  PHQ-9 Score 0 11   1   Difficult doing work/chores  Somewhat difficult   Somewhat difficult       Assessment & Plan:    Patient Active Problem List   Diagnosis Date Noted   Healthcare maintenance 10/17/2021   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   Acute bronchitis 10/06/2021   Hospital discharge follow-up 09/10/2021   Elevated  troponin 08/24/2021   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   Intra-abdominal lymphadenopathy 07/30/2020   Generalized lymphadenopathy 07/25/2020   Varicella zoster 01/04/2020   AIDS (acquired immune deficiency syndrome) ([place]) 10/10/2019   Chronic combined systolic and diastolic CHF (congestive heart failure) ([place]) 09/18/2019   HIV (human immunodeficiency virus infection) ([place]) 09/18/2019   Substance abuse ([place]) 09/18/2019   Alcohol abuse 09/18/2019   Tobacco abuse 09/18/2019   HTN (hypertension) 03/10/2019   Congestive heart failure ([place]) 03/10/2019   Noncompliance with medication regimen 03/10/2019     Problem List Items Addressed This Visit   None    I am having Richard Cox maintain his acetaminophen, aspirin, potassium chloride SA, carvedilol, hydrOXYzine, losartan, Biktarvy, furosemide, and hydrALAZINE.   No orders of the defined types were placed in this encounter.    Follow-up: No follow-ups on file.   Terri Piedra, MSN, FNP-C Nurse Practitioner [facility] [place] number: [phone]

## 2022-08-10 NOTE — Telephone Encounter (Signed)
Patient called complaining of labored breathing, SOB and a cough for a couple of days. Patient advised he will need to go to the ED to be evaluated. Patient also seen in the ED on 08/01/22 for the same symptoms. He reports he has restarted his lasix as well. Patient stated he may wait to discuss symptoms with Marya Amsler at his appointment tomorrow. However, I did encourage patient he should go to ED and he stated if worsening symptoms he will go.  Emelee Rodocker T Brooks Sailors

## 2022-08-11 ENCOUNTER — Ambulatory Visit: Payer: Medicaid Other | Admitting: Family

## 2022-08-12 ENCOUNTER — Other Ambulatory Visit: Payer: Self-pay

## 2022-08-12 ENCOUNTER — Emergency Department (HOSPITAL_COMMUNITY)
Admission: EM | Admit: 2022-08-12 | Discharge: 2022-08-12 | Discharge status: Against Medical Advice | Payer: Medicaid Other | Source: Home / Self Care

## 2022-08-12 ENCOUNTER — Emergency Department (HOSPITAL_COMMUNITY): Payer: Medicaid Other

## 2022-08-12 ENCOUNTER — Encounter (HOSPITAL_COMMUNITY): Payer: Self-pay | Admitting: Emergency Medicine

## 2022-08-12 DIAGNOSIS — F1490 Cocaine use, unspecified, uncomplicated: Secondary | ICD-10-CM | POA: Diagnosis not present

## 2022-08-12 DIAGNOSIS — Z79899 Other long term (current) drug therapy: Secondary | ICD-10-CM | POA: Diagnosis not present

## 2022-08-12 DIAGNOSIS — I110 Hypertensive heart disease with heart failure: Secondary | ICD-10-CM | POA: Diagnosis not present

## 2022-08-12 DIAGNOSIS — Z8249 Family history of ischemic heart disease and other diseases of the circulatory system: Secondary | ICD-10-CM | POA: Diagnosis not present

## 2022-08-12 DIAGNOSIS — Z832 Family history of diseases of the blood and blood-forming organs and certain disorders involving the immune mechanism: Secondary | ICD-10-CM | POA: Diagnosis not present

## 2022-08-12 DIAGNOSIS — R0602 Shortness of breath: Secondary | ICD-10-CM | POA: Insufficient documentation

## 2022-08-12 DIAGNOSIS — R059 Cough, unspecified: Secondary | ICD-10-CM | POA: Insufficient documentation

## 2022-08-12 DIAGNOSIS — Z807 Family history of other malignant neoplasms of lymphoid, hematopoietic and related tissues: Secondary | ICD-10-CM | POA: Diagnosis not present

## 2022-08-12 DIAGNOSIS — I422 Other hypertrophic cardiomyopathy: Secondary | ICD-10-CM | POA: Diagnosis not present

## 2022-08-12 DIAGNOSIS — I509 Heart failure, unspecified: Secondary | ICD-10-CM | POA: Insufficient documentation

## 2022-08-12 DIAGNOSIS — Z7982 Long term (current) use of aspirin: Secondary | ICD-10-CM | POA: Diagnosis not present

## 2022-08-12 DIAGNOSIS — F1010 Alcohol abuse, uncomplicated: Secondary | ICD-10-CM | POA: Diagnosis not present

## 2022-08-12 DIAGNOSIS — Z20822 Contact with and (suspected) exposure to covid-19: Secondary | ICD-10-CM | POA: Diagnosis not present

## 2022-08-12 DIAGNOSIS — B20 Human immunodeficiency virus [HIV] disease: Secondary | ICD-10-CM | POA: Diagnosis not present

## 2022-08-12 DIAGNOSIS — Z87891 Personal history of nicotine dependence: Secondary | ICD-10-CM | POA: Diagnosis not present

## 2022-08-12 DIAGNOSIS — Z833 Family history of diabetes mellitus: Secondary | ICD-10-CM | POA: Diagnosis not present

## 2022-08-12 DIAGNOSIS — I169 Hypertensive crisis, unspecified: Secondary | ICD-10-CM | POA: Diagnosis present

## 2022-08-12 DIAGNOSIS — Z5321 Procedure and treatment not carried out due to patient leaving prior to being seen by health care provider: Secondary | ICD-10-CM | POA: Insufficient documentation

## 2022-08-12 DIAGNOSIS — C8510 Unspecified B-cell lymphoma, unspecified site: Secondary | ICD-10-CM | POA: Diagnosis not present

## 2022-08-12 DIAGNOSIS — F1290 Cannabis use, unspecified, uncomplicated: Secondary | ICD-10-CM | POA: Diagnosis not present

## 2022-08-12 DIAGNOSIS — I5041 Acute combined systolic (congestive) and diastolic (congestive) heart failure: Secondary | ICD-10-CM | POA: Diagnosis not present

## 2022-08-12 MED ORDER — ASPIRIN 81 MG PO CHEW: 324.0000 mg | CHEWABLE_TABLET | Freq: Once | ORAL | Status: DC

## 2022-08-12 NOTE — ED Triage Notes (Signed)
Pt to ER states [facility] for 2 days.  States hx of CHF, compliant with lasix, denies weight gain.  Pt reports cold symptoms for several days.  Pt reports cough productive of "foamy" sputum.

## 2022-08-12 NOTE — ED Notes (Signed)
Pt called several times by lab for studies with no answer.  Per registration, pt left.

## 2022-08-12 NOTE — ED Notes (Signed)
Called pt who states that he did not think he could wait and was going to go to [name] for a shorter wait time.  Pt states he may return and is aware that he will need to check in a second time.  Will LWOT pt at this time.

## 2022-08-13 ENCOUNTER — Other Ambulatory Visit: Payer: Self-pay

## 2022-08-13 ENCOUNTER — Ambulatory Visit: Payer: Medicaid Other | Admitting: Family

## 2022-08-13 ENCOUNTER — Emergency Department (HOSPITAL_COMMUNITY)
Admission: EM | Admit: 2022-08-13 | Discharge: 2022-08-13 | DRG: 304 | Discharge status: Against Medical Advice | Payer: Medicaid Other | Attending: Internal Medicine | Admitting: Internal Medicine

## 2022-08-13 ENCOUNTER — Emergency Department (HOSPITAL_COMMUNITY): Payer: Medicaid Other

## 2022-08-13 DIAGNOSIS — R7989 Other specified abnormal findings of blood chemistry: Secondary | ICD-10-CM | POA: Diagnosis present

## 2022-08-13 DIAGNOSIS — I422 Other hypertrophic cardiomyopathy: Secondary | ICD-10-CM | POA: Diagnosis present

## 2022-08-13 DIAGNOSIS — J9600 Acute respiratory failure, unspecified whether with hypoxia or hypercapnia: Principal | ICD-10-CM

## 2022-08-13 DIAGNOSIS — Z833 Family history of diabetes mellitus: Secondary | ICD-10-CM

## 2022-08-13 DIAGNOSIS — Z20822 Contact with and (suspected) exposure to covid-19: Secondary | ICD-10-CM | POA: Diagnosis present

## 2022-08-13 DIAGNOSIS — I169 Hypertensive crisis, unspecified: Principal | ICD-10-CM | POA: Diagnosis present

## 2022-08-13 DIAGNOSIS — Z87891 Personal history of nicotine dependence: Secondary | ICD-10-CM

## 2022-08-13 DIAGNOSIS — B20 Human immunodeficiency virus [HIV] disease: Secondary | ICD-10-CM | POA: Diagnosis present

## 2022-08-13 DIAGNOSIS — Z807 Family history of other malignant neoplasms of lymphoid, hematopoietic and related tissues: Secondary | ICD-10-CM

## 2022-08-13 DIAGNOSIS — Z8249 Family history of ischemic heart disease and other diseases of the circulatory system: Secondary | ICD-10-CM

## 2022-08-13 DIAGNOSIS — I110 Hypertensive heart disease with heart failure: Secondary | ICD-10-CM | POA: Diagnosis present

## 2022-08-13 DIAGNOSIS — C8510 Unspecified B-cell lymphoma, unspecified site: Secondary | ICD-10-CM | POA: Diagnosis present

## 2022-08-13 DIAGNOSIS — Z720 Tobacco use: Secondary | ICD-10-CM | POA: Diagnosis present

## 2022-08-13 DIAGNOSIS — Z79899 Other long term (current) drug therapy: Secondary | ICD-10-CM

## 2022-08-13 DIAGNOSIS — F1290 Cannabis use, unspecified, uncomplicated: Secondary | ICD-10-CM | POA: Diagnosis present

## 2022-08-13 DIAGNOSIS — I161 Hypertensive emergency: Secondary | ICD-10-CM

## 2022-08-13 DIAGNOSIS — F1910 Other psychoactive substance abuse, uncomplicated: Secondary | ICD-10-CM | POA: Diagnosis present

## 2022-08-13 DIAGNOSIS — I5041 Acute combined systolic (congestive) and diastolic (congestive) heart failure: Secondary | ICD-10-CM | POA: Diagnosis present

## 2022-08-13 DIAGNOSIS — Z832 Family history of diseases of the blood and blood-forming organs and certain disorders involving the immune mechanism: Secondary | ICD-10-CM

## 2022-08-13 DIAGNOSIS — F1490 Cocaine use, unspecified, uncomplicated: Secondary | ICD-10-CM | POA: Diagnosis present

## 2022-08-13 DIAGNOSIS — I509 Heart failure, unspecified: Secondary | ICD-10-CM

## 2022-08-13 DIAGNOSIS — Z7982 Long term (current) use of aspirin: Secondary | ICD-10-CM

## 2022-08-13 DIAGNOSIS — F1010 Alcohol abuse, uncomplicated: Secondary | ICD-10-CM | POA: Diagnosis present

## 2022-08-13 LAB — COMPREHENSIVE METABOLIC PANEL
ALT: 26 U/L (ref 0–44)
AST: 32 U/L (ref 15–41)
Albumin: 3.4 g/dL — ABNORMAL LOW (ref 3.5–5.0)
Alkaline Phosphatase: 82 U/L (ref 38–126)
Anion gap: 11 (ref 5–15)
BUN: 23 mg/dL — ABNORMAL HIGH (ref 6–20)
CO2: 26 mmol/L (ref 22–32)
Calcium: 8.7 mg/dL — ABNORMAL LOW (ref 8.9–10.3)
Chloride: 106 mmol/L (ref 98–111)
Creatinine, Ser: 1.27 mg/dL — ABNORMAL HIGH (ref 0.61–1.24)
GFR, Estimated: >60 mL/min (ref >60)
Glucose, Bld: 104 mg/dL — ABNORMAL HIGH (ref 70–99)
Potassium: 4 mmol/L (ref 3.5–5.1)
Sodium: 143 mmol/L (ref 135–145)
Total Bilirubin: 0.7 mg/dL (ref 0.3–1.2)
Total Protein: 7.8 g/dL (ref 6.5–8.1)

## 2022-08-13 LAB — CBC WITH DIFFERENTIAL/PLATELET
Abs Immature Granulocytes: 0.03 10*3/uL (ref 0.00–0.07)
Basophils Absolute: 0 10*3/uL (ref 0.0–0.1)
Basophils Relative: 0 %
Eosinophils Absolute: 0.1 10*3/uL (ref 0.0–0.5)
Eosinophils Relative: 1 %
HCT: 38.8 % — ABNORMAL LOW (ref 39.0–52.0)
Hemoglobin: 12.8 g/dL — ABNORMAL LOW (ref 13.0–17.0)
Immature Granulocytes: 1 %
Lymphocytes Relative: 22 %
Lymphs Abs: 1.4 10*3/uL (ref 0.7–4.0)
MCH: 29.5 pg (ref 26.0–34.0)
MCHC: 33 g/dL (ref 30.0–36.0)
MCV: 89.4 fL (ref 80.0–100.0)
Monocytes Absolute: 0.3 10*3/uL (ref 0.1–1.0)
Monocytes Relative: 5 %
Neutro Abs: 4.6 10*3/uL (ref 1.7–7.7)
Neutrophils Relative %: 71 %
Platelets: 196 10*3/uL (ref 150–400)
RBC: 4.34 MIL/uL (ref 4.22–5.81)
RDW: 16.4 % — ABNORMAL HIGH (ref 11.5–15.5)
WBC: 6.5 10*3/uL (ref 4.0–10.5)
nRBC: 0 % (ref 0.0–0.2)

## 2022-08-13 LAB — RESP PANEL BY RT-PCR (FLU A&B, COVID) ARPGX2
Influenza A by PCR: NEGATIVE
Influenza B by PCR: NEGATIVE
SARS Coronavirus 2 by RT PCR: NEGATIVE

## 2022-08-13 LAB — CBC
HCT: 38.7 % — ABNORMAL LOW (ref 39.0–52.0)
Hemoglobin: 12.7 g/dL — ABNORMAL LOW (ref 13.0–17.0)
MCH: 29.4 pg (ref 26.0–34.0)
MCHC: 32.8 g/dL (ref 30.0–36.0)
MCV: 89.6 fL (ref 80.0–100.0)
Platelets: 189 10*3/uL (ref 150–400)
RBC: 4.32 MIL/uL (ref 4.22–5.81)
RDW: 16.1 % — ABNORMAL HIGH (ref 11.5–15.5)
WBC: 7.7 10*3/uL (ref 4.0–10.5)
nRBC: 0 % (ref 0.0–0.2)

## 2022-08-13 LAB — BRAIN NATRIURETIC PEPTIDE: B Natriuretic Peptide: 4359 pg/mL — ABNORMAL HIGH (ref 0.0–100.0)

## 2022-08-13 LAB — TROPONIN I (HIGH SENSITIVITY): Troponin I (High Sensitivity): 153 ng/L (ref <18)

## 2022-08-13 LAB — CREATININE, SERUM
Creatinine, Ser: 1.32 mg/dL — ABNORMAL HIGH (ref 0.61–1.24)
GFR, Estimated: >60 mL/min (ref >60)

## 2022-08-13 MED ORDER — NITROGLYCERIN IN D5W 200-5 MCG/ML-% IV SOLN
5.0000 ug/min | INTRAVENOUS | Status: DC
  Administered 2022-08-13: 5 ug/min via INTRAVENOUS
  Filled 2022-08-13: qty 250

## 2022-08-13 MED ORDER — HEPARIN SOD (PORK) LOCK FLUSH 100 UNIT/ML IV SOLN
500.0000 [IU] | Freq: Once | INTRAVENOUS | Status: AC
  Administered 2022-08-13: 500 [IU]
  Filled 2022-08-13: qty 5

## 2022-08-13 MED ORDER — ACETAMINOPHEN 650 MG RE SUPP: 650.0000 mg | Freq: Four times a day (QID) | RECTAL | Status: DC | PRN

## 2022-08-13 MED ORDER — ONDANSETRON HCL 4 MG/2ML IJ SOLN: 4.0000 mg | Freq: Four times a day (QID) | INTRAMUSCULAR | Status: DC | PRN

## 2022-08-13 MED ORDER — BICTEGRAVIR-EMTRICITAB-TENOFOV 50-200-25 MG PO TABS: 1.0000 | ORAL_TABLET | Freq: Every day | ORAL | Status: DC

## 2022-08-13 MED ORDER — HEPARIN SODIUM (PORCINE) 5000 UNIT/ML IJ SOLN: 5000.0000 [IU] | Freq: Three times a day (TID) | INTRAMUSCULAR | Status: DC

## 2022-08-13 MED ORDER — SODIUM CHLORIDE 0.9 % IV SOLN: 250.0000 mL | INTRAVENOUS | Status: DC | PRN

## 2022-08-13 MED ORDER — METHYLPREDNISOLONE SODIUM SUCC 125 MG IJ SOLR
125.0000 mg | Freq: Once | INTRAMUSCULAR | Status: AC
  Administered 2022-08-13: 125 mg via INTRAVENOUS
  Filled 2022-08-13: qty 2

## 2022-08-13 MED ORDER — FUROSEMIDE 10 MG/ML IJ SOLN: 40.0000 mg | Freq: Two times a day (BID) | INTRAMUSCULAR | Status: DC

## 2022-08-13 MED ORDER — ALBUTEROL SULFATE (2.5 MG/3ML) 0.083% IN NEBU
10.0000 mg | INHALATION_SOLUTION | RESPIRATORY_TRACT | Status: AC
  Administered 2022-08-13: 10 mg via RESPIRATORY_TRACT
  Filled 2022-08-13 (×2): qty 12

## 2022-08-13 MED ORDER — FUROSEMIDE 10 MG/ML IJ SOLN
40.0000 mg | INTRAMUSCULAR | Status: AC
  Administered 2022-08-13: 40 mg via INTRAVENOUS
  Filled 2022-08-13: qty 4

## 2022-08-13 MED ORDER — CARVEDILOL 12.5 MG PO TABS: 12.5000 mg | ORAL_TABLET | Freq: Two times a day (BID) | ORAL | Status: DC

## 2022-08-13 MED ORDER — LOSARTAN POTASSIUM 25 MG PO TABS: 50.0000 mg | ORAL_TABLET | Freq: Every day | ORAL | Status: DC

## 2022-08-13 MED ORDER — SODIUM CHLORIDE 0.9% FLUSH: 3.0000 mL | Freq: Two times a day (BID) | INTRAVENOUS | Status: DC

## 2022-08-13 MED ORDER — ASPIRIN 81 MG PO CHEW: 81.0000 mg | CHEWABLE_TABLET | Freq: Every day | ORAL | Status: DC

## 2022-08-13 MED ORDER — ONDANSETRON HCL 4 MG PO TABS: 4.0000 mg | ORAL_TABLET | Freq: Four times a day (QID) | ORAL | Status: DC | PRN

## 2022-08-13 MED ORDER — SODIUM CHLORIDE 0.9% FLUSH: 3.0000 mL | INTRAVENOUS | Status: DC | PRN

## 2022-08-13 MED ORDER — ACETAMINOPHEN 325 MG PO TABS: 650.0000 mg | ORAL_TABLET | Freq: Four times a day (QID) | ORAL | Status: DC | PRN

## 2022-08-13 MED ORDER — HYDRALAZINE HCL 25 MG PO TABS: 50.0000 mg | ORAL_TABLET | Freq: Three times a day (TID) | ORAL | Status: DC

## 2022-08-13 NOTE — ED Provider Notes (Signed)
[facility] EMERGENCY DEPARTMENT Provider Note   CSN: [id number] Arrival date & time: 08/13/22  0930     History  Chief Complaint  Patient presents with   Shortness of Breath    Richard Cox is a 41 y.o. male.   Shortness of Breath  This patient is a 41 year old male, he reportedly has a history of HIV, hypertension, he is unclear when he last had a work-up however he had an echocardiogram on the medical record from October 2022 approximately 1 year ago which showed that he had an ejection fraction of 45 to 50%.  He was also found to have grade 2 diastolic dysfunction considering that he had congestive heart failure.  He has severe asymmetric hypertrophic cardiomyopathy.  MRI ordered August 26, 2021 showed global hypokinesis, trivial pericardial effusion.  He presents today with increasing shortness of breath.  In fact the patient has been seen several times recently including an ER visit on September 23 when he was diagnosed with acute on chronic congestive heart failure but refused to stay and left [facility].  He last night came to the emergency department but left after a short time before he could be seen by provider because he did not want to wait.  He went home and comes back today with ongoing shortness of breath.  He has no chest pain at this time, he feels like he cannot breathe well, he denies any swelling fevers or chills.  He states that about a month ago he self diagnosed with a upper respiratory infection with coughing runny nose and a sore throat all of which improved except for the cough which has persisted.  He denies any known history of COPD and does not use any inhalers.    Home Medications Prior to Admission medications   Medication Sig Start Date End Date Taking? Authorizing Provider  acetaminophen (TYLENOL) 500 MG tablet Take 1,000 mg by mouth every 6 (six) hours as needed for moderate pain or headache.    [provider]  aspirin 81 MG  chewable tablet Chew 1 tablet (81 mg total) by mouth daily. 08/28/21   Geradine Girt, DO  bictegravir-emtricitabine-tenofovir AF (BIKTARVY) 50-200-25 MG TABS tablet Take 1 tablet by mouth daily. 07/30/22   Golden Circle, FNP  carvedilol (COREG) 12.5 MG tablet Take 1 tablet (12.5 mg total) by mouth 2 (two) times daily with a meal. 12/31/21   Lindell Spar, MD  furosemide (LASIX) 40 MG tablet Take 1 tablet (40 mg total) by mouth daily as needed for fluid. 08/01/22 11/29/22  Davonna Belling, MD  hydrALAZINE (APRESOLINE) 50 MG tablet Take 1 tablet (50 mg total) by mouth 3 (three) times daily. 08/01/22   Davonna Belling, MD  hydrOXYzine (ATARAX) 25 MG tablet TAKE 1 TABLET BY MOUTH AT BEDTIME AS NEEDED FOR ANXIETY AND ISOMNIA 03/11/22   Lindell Spar, MD  losartan (COZAAR) 50 MG tablet Take 1 tablet (50 mg total) by mouth daily. 05/31/22   Sherwood Gambler, MD  potassium chloride SA (KLOR-CON) 20 MEQ tablet Take 0.5 tablets (10 mEq total) by mouth daily as needed (when taking lasix). 08/27/21   Geradine Girt, DO      Allergies    Neosporin [neomycin-bacitracin zn-polymyx]    Review of Systems   Review of Systems  Respiratory:  Positive for shortness of breath.   All other systems reviewed and are negative.   Physical Exam Updated Vital Signs Pulse (!) 107   Resp 20  Ht 1.854 m ('6\' 1"'$ )   Wt 74.8 kg   SpO2 97%   BMI 21.77 kg/m  Physical Exam Vitals and nursing note reviewed.  Constitutional:      General: He is in acute distress.     Appearance: He is well-developed.  HENT:     Head: Normocephalic and atraumatic.     Mouth/Throat:     Pharynx: No oropharyngeal exudate.  Eyes:     General: No scleral icterus.       Right eye: No discharge.        Left eye: No discharge.     Conjunctiva/sclera: Conjunctivae normal.     Pupils: Pupils are equal, round, and reactive to light.  Neck:     Thyroid: No thyromegaly.     Vascular: No JVD.     Comments: No JVD Cardiovascular:      Rate and Rhythm: Regular rhythm. Tachycardia present.     Heart sounds: Normal heart sounds. No murmur heard.    No friction rub. No gallop.  Pulmonary:     Effort: Tachypnea and respiratory distress present.     Breath sounds: Normal breath sounds. No wheezing or rales.  Abdominal:     General: Bowel sounds are normal. There is no distension.     Palpations: Abdomen is soft. There is no mass.     Tenderness: There is no abdominal tenderness.  Musculoskeletal:        General: No tenderness. Normal range of motion.     Cervical back: Normal range of motion and neck supple.     Right lower leg: No edema.     Left lower leg: No edema.  Lymphadenopathy:     Cervical: No cervical adenopathy.  Skin:    General: Skin is warm and dry.     Findings: No erythema or rash.  Neurological:     Mental Status: He is alert.     Coordination: Coordination normal.  Psychiatric:        Behavior: Behavior normal.     ED Results / Procedures / Treatments   Labs (all labs ordered are listed, but only abnormal results are displayed) Labs Reviewed  RESP PANEL BY RT-PCR (FLU A&B, COVID) ARPGX2  BRAIN NATRIURETIC PEPTIDE  CBC WITH DIFFERENTIAL/PLATELET  COMPREHENSIVE METABOLIC PANEL  TROPONIN I (HIGH SENSITIVITY)    EKG None  Radiology DG Chest 2 View  Result Date: 08/12/2022 CLINICAL DATA:  Shortness of breath for 2 days EXAM: CHEST - 2 VIEW COMPARISON:  August 01, 2022 FINDINGS: Right-sided port in place with the tip in the upper SVC. Cardiomegaly, unchanged. No pleural effusion. No pneumothorax. No focal airspace opacity. No displaced rib fractures. Vertebral body heights are maintained. Likely mild pulmonary edema. IMPRESSION: Cardiomegaly with likely mild pulmonary edema, similar to prior. Electronically Signed   By: Marin Roberts M.D.   On: 08/12/2022 15:59    Procedures .Critical Care  Performed by: Noemi Chapel, MD Authorized by: Noemi Chapel, MD   Critical care provider  statement:    Critical care time (minutes):  30   Critical care time was exclusive of:  Separately billable procedures and treating other patients and teaching time   Critical care was necessary to treat or prevent imminent or life-threatening deterioration of the following conditions:  Cardiac failure and respiratory failure   Critical care was time spent personally by me on the following activities:  Development of treatment plan with patient or surrogate, discussions with consultants, evaluation of patient's response to  treatment, examination of patient, ordering and review of laboratory studies, ordering and review of radiographic studies, ordering and performing treatments and interventions, pulse oximetry, re-evaluation of patient's condition, review of old charts and obtaining history from patient or surrogate   I assumed direction of critical care for this patient from another provider in my specialty: no     Care discussed with: admitting provider   Comments:           Medications Ordered in ED Medications  albuterol (PROVENTIL,VENTOLIN) solution continuous neb (has no administration in time range)  methylPREDNISolone sodium succinate (SOLU-MEDROL) 125 mg/2 mL injection 125 mg (has no administration in time range)    ED Course/ Medical Decision Making/ A&P                           Medical Decision Making Amount and/or Complexity of Data Reviewed Labs: ordered. Radiology: ordered.  Risk Prescription drug management. Decision regarding hospitalization.   This patient presents to the ED for concern of this of breath and a cough, this involves an extensive number of treatment options, and is a complaint that carries with it a high risk of complications and morbidity.  The differential diagnosis includes congestive heart failure, COPD exacerbation, pneumonia, less likely to be primary cardiac cause such as effusion, would also consider COVID-19, review of the medical record shows  that he last had a CD4 count in December, measured at 271, HIV essentially undetectable by viral load at that time.   Co morbidities that complicate the patient evaluation  Active HIV, not taking his medications as prescribed, states his insurance has lapsed or did not cover the medication for some reason, he is actively working on that  Additional history obtained:  Additional history obtained from electronic medical record External records from outside source obtained and reviewed including cardiac MRI and echocardiogram from 1 year ago, see history above   Lab Tests:  I Ordered, and personally interpreted labs.  The pertinent results include: BNP severely elevated over 4500, troponin of 150 seems close to baseline.  CBC unremarkable, metabolic panel with some renal insufficiency but no other acute abnormalities, COVID and flu negative   Imaging Studies ordered:  I ordered imaging studies including chest x-ray I independently visualized and interpreted imaging which showed car megaly and interstitial edema I agree with the radiologist interpretation   Cardiac Monitoring: / EKG:  The patient was maintained on a cardiac monitor.  I personally viewed and interpreted the cardiac monitored which showed an underlying rhythm of: Severely hypertensive but appears to be in normal sinus rhythm, significant ST and T wave abnormalities consistent with prior EKGs   Consultations Obtained:  I requested consultation with the hospitalist,  and discussed lab and imaging findings as well as pertinent plan - they recommend: Admission to the hospital, the patient is currently on a nitro drip   Problem List / ED Course / Critical interventions / Medication management  The patient is shortness of breath, severe hypertension, ongoing wheezing though slightly improved with albuterol and steroids.  Lasix and nitroglycerin ordered, some improvement, severely hypertensive I ordered medication including  nitroglycerin drip for hypertension and likely CHF exacerbation Reevaluation of the patient after these medicines showed that the patient improved but critically ill I have reviewed the patients home medicines and have made adjustments as needed   Social Determinants of Health:  HIV   Test / Admission - Considered:  Needs admission to higher level  of care         Final Clinical Impression(s) / ED Diagnoses Final diagnoses:  Acute respiratory failure, unspecified whether with hypoxia or hypercapnia ([place])  Hypertensive emergency  Acute on chronic congestive heart failure, unspecified heart failure type ([place])    Rx / DC Orders   Noemi Chapel, MD 08/13/22 1310

## 2022-08-13 NOTE — ED Notes (Signed)
R power port accessed as two different RNs attempted IV assess on pt and was unable to obtain peripheral IV access. Blood return noted and flushed well

## 2022-08-13 NOTE — ED Triage Notes (Signed)
Pt c/o SOB intermittently , Hx of having fluid on lungs, Hx of CHF, pt stated he had a cold about a week ago, cough, fever, chills

## 2022-08-13 NOTE — ED Notes (Signed)
Pt given sandwich bag and 271m of drink

## 2022-08-13 NOTE — H&P (Signed)
History and Physical    Surya Schroeter MVH:[id number] DOB: Jun 04, 1981 DOA: 08/13/2022  PCP: Pcp, No   Patient coming from: Home  Chief Complaint: Dyspnea  HPI: Richard Cox is a 41 y.o. 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  He has been seen in the ED several times recently including a visit on 9/23 at which point he was diagnosed with acute on chronic congestive heart failure exacerbation and refused to stay and left [facility].  He denies any chest pain or any swelling.  No fevers, chills, or cough noted.  He denies any sick contacts.  He states that he has a blood pressure monitor at home, but does not routinely check his blood pressures.   ED Course: Patient given some Lasix in the ED with BNP noted to be 4359.  EKG with findings of LVH as previous and troponin 153.  Pulse elevated and blood pressure 189/129.  Chest x-ray with cardiomegaly noted.  He has been started on nitroglycerin drip for better blood pressure control.  Cardiology consulted for further evaluation.  Review of Systems: Reviewed as noted above, otherwise negative.  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 His smoking use included cigarettes. He smoked an average of .25 packs per day. He has never used smokeless tobacco. He reports that he does not currently use  alcohol. He reports current drug use. Drug: Marijuana.  Allergies  Allergen Reactions   Neosporin [Neomycin-Bacitracin Zn-Polymyx] Rash    Family History  Problem Relation Age of Onset   Diabetes Mellitus II Mother    Cancer Mother        multiple myeloma   Cancer Father        multiple myeloma   Congestive Heart Failure Brother    Heart Problems Brother    Lupus Brother     Prior to Admission medications   Medication Sig Start Date End Date Taking? Authorizing Provider  acetaminophen (TYLENOL) 500 MG tablet Take 1,000 mg by mouth every 6 (six) hours as needed for moderate pain or headache.   Yes [provider]  aspirin 81 MG chewable tablet Chew 1 tablet (81 mg total) by mouth daily. 08/28/21  Yes Vann, Jessica U, DO  bictegravir-emtricitabine-tenofovir AF (BIKTARVY) 50-200-25 MG TABS tablet Take 1 tablet by mouth daily. 07/30/22  Yes Golden Circle, FNP  carvedilol (COREG) 12.5 MG tablet Take 1 tablet (12.5 mg total) by mouth 2 (two) times daily with a meal. 12/31/21  Yes Lindell Spar, MD  furosemide (LASIX) 40 MG tablet Take 1 tablet (40 mg total) by mouth daily as needed for fluid. 08/01/22 11/29/22 Yes Davonna Belling, MD  hydrALAZINE (APRESOLINE) 50 MG tablet Take 1 tablet (50 mg total) by mouth 3 (three) times daily. 08/01/22  Yes Davonna Belling, MD  hydrOXYzine (ATARAX) 25 MG tablet TAKE 1 TABLET BY  MOUTH AT BEDTIME AS NEEDED FOR ANXIETY AND ISOMNIA Patient taking differently: Take 25 mg by mouth at bedtime as needed. 03/11/22  Yes Lindell Spar, MD  losartan (COZAAR) 50 MG tablet Take 1 tablet (50 mg total) by mouth daily. 05/31/22  Yes Sherwood Gambler, MD  potassium chloride SA (KLOR-CON) 20 MEQ tablet Take 0.5 tablets (10 mEq total) by mouth daily as needed (when taking lasix). Patient not taking: Reported on 08/13/2022 08/27/21   Geradine Girt, DO    Physical Exam: Vitals:   08/13/22 1215 08/13/22 1218 08/13/22 1230 08/13/22 1245  BP: (!) 183/120  (!)  176/119 (!) 178/121  Pulse: 98 96 94 95  Resp:  18 18 (!) 27  SpO2: 100% 100% 100% 94%  Weight:      Height:        Constitutional: NAD, calm, comfortable Vitals:   08/13/22 1215 08/13/22 1218 08/13/22 1230 08/13/22 1245  BP: (!) 183/120  (!) 176/119 (!) 178/121  Pulse: 98 96 94 95  Resp:  18 18 (!) 27  SpO2: 100% 100% 100% 94%  Weight:      Height:       Eyes: lids and conjunctivae normal Neck: normal, supple Respiratory: clear to auscultation bilaterally. Normal respiratory effort. No accessory muscle use.  Cardiovascular: Regular rate and rhythm, no murmurs. Abdomen: no tenderness, no distention. Bowel sounds positive.  Musculoskeletal:  No edema. Skin: no rashes, lesions, ulcers.  Psychiatric: Flat affect  Labs on Admission: I have personally reviewed following labs and imaging studies  CBC: Recent Labs  Lab 08/13/22 0957  WBC 6.5  NEUTROABS 4.6  HGB 12.8*  HCT 38.8*  MCV 89.4  PLT 063   Basic Metabolic Panel: Recent Labs  Lab 08/13/22 0957  NA 143  K 4.0  CL 106  CO2 26  GLUCOSE 104*  BUN 23*  CREATININE 1.27*  CALCIUM 8.7*   GFR: Estimated Creatinine Clearance: 82.6 mL/min (A) (by C-G formula based on SCr of 1.27 mg/dL (H)). Liver Function Tests: Recent Labs  Lab 08/13/22 0957  AST 32  ALT 26  ALKPHOS 82  BILITOT 0.7  PROT 7.8  ALBUMIN 3.4*   No results for input(s): "LIPASE", "AMYLASE" in the last 168 hours. No results for input(s): "AMMONIA" in the last 168 hours. Coagulation Profile: No results for input(s): "INR", "PROTIME" in the last 168 hours. Cardiac Enzymes: No results for input(s): "CKTOTAL", "CKMB", "CKMBINDEX", "TROPONINI" in the last 168 hours. BNP (last 3 results) No results for input(s): "PROBNP" in the last 8760 hours. HbA1C: No results for input(s): "HGBA1C" in the last 72 hours. CBG: No results for input(s): "GLUCAP" in the last 168 hours. Lipid Profile: No results for input(s): "CHOL", "HDL", "LDLCALC", "TRIG",  "CHOLHDL", "LDLDIRECT" in the last 72 hours. Thyroid Function Tests: No results for input(s): "TSH", "T4TOTAL", "FREET4", "T3FREE", "THYROIDAB" in the last 72 hours. Anemia Panel: No results for input(s): "VITAMINB12", "FOLATE", "FERRITIN", "TIBC", "IRON", "RETICCTPCT" in the last 72 hours. Urine analysis:    Component Value Date/Time   COLORURINE YELLOW 08/24/2021 0410   APPEARANCEUR HAZY (A) 08/24/2021 0410   LABSPEC 1.012 08/24/2021 0410   PHURINE 5.0 08/24/2021 0410   GLUCOSEU 50 (A) 08/24/2021 0410   HGBUR SMALL (A) 08/24/2021 0410   BILIRUBINUR NEGATIVE 08/24/2021 0410   KETONESUR NEGATIVE 08/24/2021 0410   PROTEINUR 100 (A) 08/24/2021 0410   NITRITE NEGATIVE 08/24/2021 0410   LEUKOCYTESUR NEGATIVE 08/24/2021 0410    Radiological Exams on Admission: DG Chest Port 1 [id number]  Result Date: 08/13/2022 CLINICAL DATA:  Provided history: Cough. Shortness of breath and weakness. EXAM: PORTABLE CHEST 1 VIEW COMPARISON:  [place] 08/12/2022 and earlier.  Chest CT 05/31/2022. FINDINGS: Right chest infusion port catheter with tip projecting at the level of the mid/lower SVC. Cardiomegaly, unchanged. Prominence of the interstitial lung markings, bilaterally. No appreciable airspace consolidation. No evidence of pleural effusion or pneumothorax. No acute bony abnormality identified. IMPRESSION: Cardiomegaly. Prominence of the interstitial lung markings bilaterally, likely reflecting interstitial edema. Electronically Signed   By: Kellie Simmering D.O.   On: 08/13/2022 10:43   DG Chest 2 View  Result Date: 08/12/2022 CLINICAL DATA:  Shortness of breath for 2 days EXAM: CHEST - 2 VIEW COMPARISON:  August 01, 2022 FINDINGS: Right-sided port in place with the tip in the upper SVC. Cardiomegaly, unchanged. No pleural effusion. No pneumothorax. No focal airspace opacity. No displaced rib fractures. Vertebral body heights are maintained. Likely mild pulmonary edema. IMPRESSION: Cardiomegaly with likely mild  pulmonary edema, similar to prior. Electronically Signed   By: Marin Roberts M.D.   On: 08/12/2022 15:59    EKG: Independently reviewed. ST 101bpm with LVH.  Assessment/Plan Principal Problem:   Hypertensive crisis Active Problems:   Congestive heart failure (HCC)   HIV (human immunodeficiency virus infection) ([place])   Substance abuse ([place])   Alcohol abuse   Tobacco abuse   AIDS (acquired immune deficiency syndrome) (HCC)   High grade B-cell lymphoma (HCC)   Elevated troponin    Hypertensive crisis with hypertrophic cardiomyopathy Appears to have very poorly controlled hypertension at home and he barely monitors this Likely also has medication noncompliance, but will not admit to this Previously positive for cocaine and admits to cocaine use approximately 1 week prior Continue nitroglycerin drip as well as diuresis with Lasix  Acute systolic/diastolic CHF secondary to above Prior 2D echocardiogram 10/22 with LVEF 45-50% with grade 2 diastolic dysfunction and severe LVH Agree with nitroglycerin drip as above and IV Lasix for diuresis Repeat 2D echocardiogram Appreciate cardiology evaluation  Elevated troponin secondary to above Appears to be demand related and patient denies chest pain 2D echocardiogram as noted above  History of polysubstance abuse/tobacco abuse Admits to marijuana and cocaine use intermittently along with tobacco abuse Counseled on cessation  HIV/AIDs Continue Biktarvy Follow-up with ID  History of lymphoma Stopped chemotherapy treatments over a year ago, follows with Dr. Delton Coombes   DVT prophylaxis: Heparin Code Status: Full Family Communication: None at bedside Disposition Plan:Admit for CHF eval/tx Consults called:Cardiology Admission status: Inpatient, SDU  Severity of Illness: The appropriate patient status for this patient is INPATIENT. Inpatient status is judged to be reasonable and necessary in order to provide the required intensity of  service to ensure the patient's safety. The patient's presenting symptoms, physical exam findings, and initial radiographic and laboratory data in the context of their chronic comorbidities is felt to place them at high risk for further clinical deterioration. Furthermore, it is not anticipated that the patient will be medically stable for discharge from the hospital within 2 midnights of admission.   * I certify that at the point of admission it is my clinical judgment that the patient will require inpatient hospital care spanning beyond 2 midnights from the point of admission due to high intensity of service, high risk for further deterioration and high frequency of surveillance required.*   Kammi Hechler D Darwin Guastella DO [facility]  If 7PM-7AM, please contact night-coverage [email or web address]  08/13/2022, 1:26 PM

## 2022-08-13 NOTE — ED Notes (Signed)
Pt's BP consistently cycling high, last BP reading was 189/129--MD made aware

## 2022-08-13 NOTE — Discharge Summary (Signed)
Physician Discharge Summary  Kairon Shock AYT:[id number] DOB: 06/02/1981 DOA: 08/13/2022  PCP: Pcp, No  Admit date: 08/13/2022  Discharge date: 08/13/2022  Admitted From:Home  Disposition:  AMA DISCHARGE  CODE STATUS: Full  Brief/Interim Summary: Richard Cox is a 41 y.o. 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  He has been seen in the ED several times recently including a visit on 9/23 at which point he was diagnosed with acute on chronic congestive heart failure exacerbation and refused to stay and left AMA.  He was admitted with acute systolic/diastolic CHF exacerbation in the setting of hypertensive crisis with known hypertrophic cardiomyopathy.  He was started on Lasix and nitroglycerin drip and was going to have further evaluation with cardiology as well as 2D echocardiogram.  He did have full decision-making capacity and decided to leave [facility].  He understands the risks of doing so, but still decided to leave.  Discharge Diagnoses:  Principal Problem:   Hypertensive crisis Active Problems:   Congestive heart failure (HCC)   HIV (human immunodeficiency virus infection) ([place])   Substance abuse ([place])   Alcohol abuse   Tobacco abuse   AIDS (acquired immune deficiency syndrome) (HCC)   High grade B-cell lymphoma (HCC)   Elevated troponin  Principal discharge diagnosis: Acute systolic/diastolic CHF exacerbation in the setting of hypertensive crisis with known hypertrophic cardiomyopathy.   Allergies  Allergen Reactions   Neosporin [Neomycin-Bacitracin Zn-Polymyx] Rash    Consultations: Cardiology   Procedures/Studies: DG Chest Port 1 View  Result Date: 08/13/2022 CLINICAL DATA:  Provided history: Cough. Shortness of breath and weakness. EXAM: PORTABLE  CHEST 1 VIEW COMPARISON:  [place] 08/12/2022 and earlier.  Chest CT 05/31/2022. FINDINGS: Right chest infusion port catheter with tip projecting at the level of the mid/lower SVC. Cardiomegaly, unchanged. Prominence of the interstitial lung markings, bilaterally. No appreciable airspace consolidation. No evidence of pleural effusion or pneumothorax. No acute bony abnormality identified. IMPRESSION: Cardiomegaly. Prominence of the interstitial lung markings bilaterally, likely reflecting interstitial edema. Electronically Signed   By: Kellie Simmering D.O.   On: 08/13/2022 10:43   DG Chest 2 View  Result Date: 08/12/2022 CLINICAL DATA:  Shortness of breath for 2 days EXAM: CHEST - 2 VIEW COMPARISON:  August 01, 2022 FINDINGS: Right-sided port in place with the tip in the upper SVC. Cardiomegaly, unchanged. No pleural effusion. No pneumothorax. No focal airspace opacity. No displaced rib fractures. Vertebral body heights are maintained. Likely mild pulmonary edema. IMPRESSION: Cardiomegaly with likely mild pulmonary edema, similar to prior. Electronically Signed   By: Marin Roberts M.D.   On: 08/12/2022 15:59   DG Chest 2 View  Result Date: 08/01/2022 CLINICAL DATA:  41 year old male with history of shortness of breath and chest pain. EXAM: CHEST - 2 VIEW COMPARISON:  Chest x-ray 05/31/2022. FINDINGS: Right internal jugular single-lumen porta cath with tip terminating in the mid superior vena cava. There is cephalization of the pulmonary vasculature and slight indistinctness of the interstitial markings suggestive of mild pulmonary edema. Trace bilateral pleural effusions. Mild cardiomegaly. Upper mediastinal contours are within normal limits. IMPRESSION: 1. The appearance of the chest suggests mild congestive heart failure, as above. Electronically Signed   By: Vinnie Langton M.D.   On: 08/01/2022 08:54     Discharge Exam: Vitals:   08/13/22 1430 08/13/22 1445  BP: (!) 176/112 Marland Kitchen)  180/112  Pulse: 95 94   Resp: (!) 25 (!) 24  Temp:    SpO2: 95% 95%   Vitals:   08/13/22 1348 08/13/22 1415 08/13/22 1430 08/13/22 1445  BP:  (!) 180/113 (!) 176/112 (!) 180/112  Pulse:  95 95 94  Resp:  16 (!) 25 (!) 24  Temp: 98.9 F (37.2 C)     TempSrc: Oral     SpO2:  96% 95% 95%  Weight:      Height:        General: Pt is alert, awake, not in acute distress Cardiovascular: RRR, S1/S2 +, no rubs, no gallops Respiratory: CTA bilaterally, no wheezing, no rhonchi Abdominal: Soft, NT, ND, bowel sounds + Extremities: no edema, no cyanosis    The results of significant diagnostics from this hospitalization (including imaging, microbiology, ancillary and laboratory) are listed below for reference.     Microbiology: Recent Results (from the past 240 hour(s))  Resp Panel by RT-PCR (Flu A&B, Covid) Anterior Nasal Swab     Status: None   Collection Time: 08/13/22  9:46 AM   Specimen: Anterior Nasal Swab  Result Value Ref Range Status   SARS Coronavirus 2 by RT PCR NEGATIVE NEGATIVE Final    Comment: (NOTE) SARS-CoV-2 target nucleic acids are NOT DETECTED.  The SARS-CoV-2 RNA is generally detectable in upper respiratory specimens during the acute phase of infection. The lowest concentration of SARS-CoV-2 viral copies this assay can detect is 138 copies/mL. A negative result does not preclude SARS-Cov-2 infection and should not be used as the sole basis for treatment or other patient management decisions. A negative result may occur with  improper specimen collection/handling, submission of specimen other than nasopharyngeal swab, presence of viral mutation(s) within the areas targeted by this assay, and inadequate number of viral copies(<138 copies/mL). A negative result must be combined with clinical observations, patient history, and epidemiological information. The expected result is Negative.  Fact Sheet for Patients:  EntrepreneurPulse.com.au  Fact Sheet for Healthcare  Providers:  IncredibleEmployment.be  This test is no t yet approved or cleared by the [place] FDA and  has been authorized for detection and/or diagnosis of SARS-CoV-2 by FDA under an Emergency Use Authorization (EUA). This EUA will remain  in effect (meaning this test can be used) for the duration of the COVID-19 declaration under Section 564(b)(1) of the Act, 21 U.S.C.section 360bbb-3(b)(1), unless the authorization is terminated  or revoked sooner.       Influenza A by PCR NEGATIVE NEGATIVE Final   Influenza B by PCR NEGATIVE NEGATIVE Final    Comment: (NOTE) The Xpert Xpress SARS-CoV-2/FLU/RSV plus assay is intended as an aid in the diagnosis of influenza from Nasopharyngeal swab specimens and should not be used as a sole basis for treatment. Nasal washings and aspirates are unacceptable for Xpert Xpress SARS-CoV-2/FLU/RSV testing.  Fact Sheet for Patients: EntrepreneurPulse.com.au  Fact Sheet for Healthcare Providers: IncredibleEmployment.be  This test is not yet approved or cleared by the [place] FDA and has been authorized for detection and/or diagnosis of SARS-CoV-2 by FDA under an Emergency Use Authorization (EUA). This EUA will remain in effect (meaning this test can be used) for the duration of the COVID-19 declaration under Section 564(b)(1) of the Act, 21 U.S.C. section 360bbb-3(b)(1), unless the authorization is terminated or revoked.  Performed at [facility], [address], [place], [place] [address]      Labs: BNP (last 3 results) Recent Labs    05/31/22 0505 08/01/22 6389  08/13/22 0957  BNP >4,500.0* >4,500.0* 7,858.8*   Basic Metabolic Panel: Recent Labs  Lab 08/13/22 0957 08/13/22 1403  NA 143  --   K 4.0  --   CL 106  --   CO2 26  --   GLUCOSE 104*  --   BUN 23*  --   CREATININE 1.27* 1.32*  CALCIUM 8.7*  --    Liver Function Tests: Recent Labs  Lab 08/13/22 0957   AST 32  ALT 26  ALKPHOS 82  BILITOT 0.7  PROT 7.8  ALBUMIN 3.4*   No results for input(s): "LIPASE", "AMYLASE" in the last 168 hours. No results for input(s): "AMMONIA" in the last 168 hours. CBC: Recent Labs  Lab 08/13/22 0957 08/13/22 1403  WBC 6.5 7.7  NEUTROABS 4.6  --   HGB 12.8* 12.7*  HCT 38.8* 38.7*  MCV 89.4 89.6  PLT 196 189   Cardiac Enzymes: No results for input(s): "CKTOTAL", "CKMB", "CKMBINDEX", "TROPONINI" in the last 168 hours. BNP: Invalid input(s): "POCBNP" CBG: No results for input(s): "GLUCAP" in the last 168 hours. D-Dimer No results for input(s): "DDIMER" in the last 72 hours. Hgb A1c No results for input(s): "HGBA1C" in the last 72 hours. Lipid Profile No results for input(s): "CHOL", "HDL", "LDLCALC", "TRIG", "CHOLHDL", "LDLDIRECT" in the last 72 hours. Thyroid function studies No results for input(s): "TSH", "T4TOTAL", "T3FREE", "THYROIDAB" in the last 72 hours.  Invalid input(s): "FREET3" Anemia work up No results for input(s): "VITAMINB12", "FOLATE", "FERRITIN", "TIBC", "IRON", "RETICCTPCT" in the last 72 hours. Urinalysis    Component Value Date/Time   COLORURINE YELLOW 08/24/2021 0410   APPEARANCEUR HAZY (A) 08/24/2021 0410   LABSPEC 1.012 08/24/2021 0410   PHURINE 5.0 08/24/2021 0410   GLUCOSEU 50 (A) 08/24/2021 0410   HGBUR SMALL (A) 08/24/2021 0410   BILIRUBINUR NEGATIVE 08/24/2021 0410   KETONESUR NEGATIVE 08/24/2021 0410   PROTEINUR 100 (A) 08/24/2021 0410   NITRITE NEGATIVE 08/24/2021 0410   LEUKOCYTESUR NEGATIVE 08/24/2021 0410   Sepsis Labs Recent Labs  Lab 08/13/22 0957 08/13/22 1403  WBC 6.5 7.7   Microbiology Recent Results (from the past 240 hour(s))  Resp Panel by RT-PCR (Flu A&B, Covid) Anterior Nasal Swab     Status: None   Collection Time: 08/13/22  9:46 AM   Specimen: Anterior Nasal Swab  Result Value Ref Range Status   SARS Coronavirus 2 by RT PCR NEGATIVE NEGATIVE Final    Comment: (NOTE) SARS-CoV-2  target nucleic acids are NOT DETECTED.  The SARS-CoV-2 RNA is generally detectable in upper respiratory specimens during the acute phase of infection. The lowest concentration of SARS-CoV-2 viral copies this assay can detect is 138 copies/mL. A negative result does not preclude SARS-Cov-2 infection and should not be used as the sole basis for treatment or other patient management decisions. A negative result may occur with  improper specimen collection/handling, submission of specimen other than nasopharyngeal swab, presence of viral mutation(s) within the areas targeted by this assay, and inadequate number of viral copies(<138 copies/mL). A negative result must be combined with clinical observations, patient history, and epidemiological information. The expected result is Negative.  Fact Sheet for Patients:  EntrepreneurPulse.com.au  Fact Sheet for Healthcare Providers:  IncredibleEmployment.be  This test is no t yet approved or cleared by the [place] FDA and  has been authorized for detection and/or diagnosis of SARS-CoV-2 by FDA under an Emergency Use Authorization (EUA). This EUA will remain  in effect (meaning this test can be used) for  the duration of the COVID-19 declaration under Section 564(b)(1) of the Act, 21 U.S.C.section 360bbb-3(b)(1), unless the authorization is terminated  or revoked sooner.       Influenza A by PCR NEGATIVE NEGATIVE Final   Influenza B by PCR NEGATIVE NEGATIVE Final    Comment: (NOTE) The Xpert Xpress SARS-CoV-2/FLU/RSV plus assay is intended as an aid in the diagnosis of influenza from Nasopharyngeal swab specimens and should not be used as a sole basis for treatment. Nasal washings and aspirates are unacceptable for Xpert Xpress SARS-CoV-2/FLU/RSV testing.  Fact Sheet for Patients: EntrepreneurPulse.com.au  Fact Sheet for Healthcare  Providers: IncredibleEmployment.be  This test is not yet approved or cleared by the [place] FDA and has been authorized for detection and/or diagnosis of SARS-CoV-2 by FDA under an Emergency Use Authorization (EUA). This EUA will remain in effect (meaning this test can be used) for the duration of the COVID-19 declaration under Section 564(b)(1) of the Act, 21 U.S.C. section 360bbb-3(b)(1), unless the authorization is terminated or revoked.  Performed at [facility], [address] [place], [place] [address]      Time coordinating discharge: 35 minutes  SIGNED:   Rodena Goldmann, DO [facility] 08/13/2022, 3:03 PM  If 7PM-7AM, please contact night-coverage [email or web address]

## 2022-08-13 NOTE — Progress Notes (Deleted)
Brief Narrative   Patient ID: Richard Cox, male    DOB: Jan 22, 1981, 41 y.o.   MRN: [id number]  Richard Cox is a 41 y/o  AA male diagnosed with HIV in May of 2020 with risk factor for acquiring HIV including MSM. No history of opportunistic infection or acute retroviral syndrome. Initial blood work for entry to care with CD4 of 188 and viral load of 65,200 (CDC Stage 3). Genotype was wild with no significant resistance. Sole medication regimen of Biktarvy.   Subjective:    No chief complaint on file.   HPI:  Richard Cox is a 40 y.o. male with HIV disease last seen on 10/17/21 with well controlled virus and good adherence and tolerance to [place].  Viral load was undetectable with CD4 count of 271.  Recently seen in the [facility] emergency department with shortness of breath and chest pain refusing admission at that time with hypertension and elevated troponin levels and diagnosis of exacerbation of heart failure..  Of note BNP was greater than 4500.  Discharged with recommend follow-up with cardiology as soon as possible.  This discharge was AMA.  Here today for follow-up.  Allergies  Allergen Reactions   Neosporin [Neomycin-Bacitracin Zn-Polymyx] Rash      Outpatient Medications Prior to Visit  Medication Sig Dispense Refill   acetaminophen (TYLENOL) 500 MG tablet Take 1,000 mg by mouth every 6 (six) hours as needed for moderate pain or headache.     aspirin 81 MG chewable tablet Chew 1 tablet (81 mg total) by mouth daily. 30 tablet 0   bictegravir-emtricitabine-tenofovir AF (BIKTARVY) 50-200-25 MG TABS tablet Take 1 tablet by mouth daily. 30 tablet 0   carvedilol (COREG) 12.5 MG tablet Take 1 tablet (12.5 mg total) by mouth 2 (two) times daily with a meal. 180 tablet 1   furosemide (LASIX) 40 MG tablet Take 1 tablet (40 mg total) by mouth daily as needed for fluid. 30 tablet 1   hydrALAZINE (APRESOLINE) 50 MG tablet Take 1 tablet (50 mg total) by mouth 3 (three) times daily. 90 tablet 5    hydrOXYzine (ATARAX) 25 MG tablet TAKE 1 TABLET BY MOUTH AT BEDTIME AS NEEDED FOR ANXIETY AND ISOMNIA 30 tablet 0   losartan (COZAAR) 50 MG tablet Take 1 tablet (50 mg total) by mouth daily. 30 tablet 1   potassium chloride SA (KLOR-CON) 20 MEQ tablet Take 0.5 tablets (10 mEq total) by mouth daily as needed (when taking lasix). 30 tablet 1   Facility-Administered Medications Prior to Visit  Medication Dose Route Frequency Provider Last Rate Last Admin   0.9 %  sodium chloride infusion   Intravenous Continuous Derek Jack, MD       0.9 %  sodium chloride infusion   Intravenous Continuous Derek Jack, MD   Stopped at 09/26/20 1124   0.9 %  sodium chloride infusion   Intravenous Continuous Derek Jack, MD 500 mL/hr at 11/04/20 0935 Bolus from Bag at 11/04/20 0935   sodium chloride flush (NS) 0.9 % injection 10 mL  10 mL Intravenous PRN Derek Jack, MD   10 mL at 10/15/20 1035   sodium chloride flush (NS) 0.9 % injection 10 mL  10 mL Intravenous PRN Derek Jack, MD   10 mL at 11/07/20 1046     Past Medical History:  Diagnosis Date   Acute respiratory failure with hypoxia ([place]) 08/24/2021   CHF (congestive heart failure) ([place])    Endotracheally intubated 08/24/2021   Essential hypertension  HIV infection ([place])    Lymphoma ([place]) 08/28/2020   Noncompliance with medication regimen    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  Eyes:  Negative for visual disturbance.  Respiratory:  Negative for cough, chest tightness, shortness of breath and wheezing.   Cardiovascular:  Negative for chest pain and leg swelling.  Gastrointestinal:  Negative for abdominal pain, constipation, diarrhea, nausea and  vomiting.  Genitourinary:  Negative for dysuria, flank pain, frequency, genital sores, hematuria and urgency.  Skin:  Negative for rash.  Allergic/Immunologic: Negative for immunocompromised state.  Neurological:  Negative for dizziness and headaches.      Objective:    There were no vitals taken for this visit. Nursing note and vital signs reviewed.  Physical Exam Constitutional:      General: He is not in acute distress.    Appearance: He is well-developed.  Eyes:     Conjunctiva/sclera: Conjunctivae normal.  Cardiovascular:     Rate and Rhythm: Normal rate and regular rhythm.     Heart sounds: Normal heart sounds. No murmur heard.    No friction rub. No gallop.  Pulmonary:     Effort: Pulmonary effort is normal. No respiratory distress.     Breath sounds: Normal breath sounds. No wheezing or rales.  Chest:     Chest wall: No tenderness.  Abdominal:     General: Bowel sounds are normal.     Palpations: Abdomen is soft.     Tenderness: There is no abdominal tenderness.  Musculoskeletal:     Cervical back: Neck supple.  Lymphadenopathy:     Cervical: No cervical adenopathy.  Skin:    General: Skin is warm and dry.     Findings: No rash.  Neurological:     Mental Status: He is alert and oriented to person, place, and time.  Psychiatric:        Behavior: Behavior normal.        Thought Content: Thought content normal.        Judgment: Judgment normal.         10/17/2021    8:47 AM 10/06/2021    8:32 AM 09/10/2021    1:21 PM 01/27/2021    9:13 AM 09/10/2020   11:00 AM  Depression screen PHQ 2/9  Decreased Interest 0 0 0 0   Down, Depressed, Hopeless 0 2 0 0 1  PHQ - 2 Score 0 2 0 0 1  Altered sleeping 0 3   0  Tired, decreased energy 0 3   0  Change in appetite 0 3   0  Feeling bad or failure about yourself  0 0   0  Trouble concentrating 0 0   0  Moving slowly or fidgety/restless 0 0   0  Suicidal thoughts 0 0   0  PHQ-9 Score 0 11   1  Difficult doing  work/chores  Somewhat difficult   Somewhat difficult       Assessment & Plan:    Patient Active Problem List   Diagnosis Date Noted   Healthcare maintenance 10/17/2021   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   Acute bronchitis 10/06/2021   Hospital discharge follow-up 09/10/2021   Elevated troponin 08/24/2021  High grade B-cell lymphoma ([place]) 08/21/2020   Intra-abdominal lymphadenopathy 07/30/2020   Generalized lymphadenopathy 07/25/2020   Varicella zoster 01/04/2020   AIDS (acquired immune deficiency syndrome) ([place]) 10/10/2019   Chronic combined systolic and diastolic CHF (congestive heart failure) ([place]) 09/18/2019   HIV (human immunodeficiency virus infection) ([place]) 09/18/2019   Substance abuse ([place]) 09/18/2019   Alcohol abuse 09/18/2019   Tobacco abuse 09/18/2019   HTN (hypertension) 03/10/2019   Congestive heart failure ([place]) 03/10/2019   Noncompliance with medication regimen 03/10/2019     Problem List Items Addressed This Visit   None    I am having Promise Bushong maintain his acetaminophen, aspirin, potassium chloride SA, carvedilol, hydrOXYzine, losartan, Biktarvy, furosemide, and hydrALAZINE.   No orders of the defined types were placed in this encounter.    Follow-up: No follow-ups on file.   Terri Piedra, MSN, FNP-C Nurse Practitioner [facility] [place] number: [phone]

## 2022-08-13 NOTE — ED Notes (Signed)
Pt is wanting to sign out AMA, pt verbalizes and acknowledges/understandings risks of signing out AMA, MD, Illene Bolus, made aware

## 2022-08-19 NOTE — Progress Notes (Deleted)
Brief Narrative   Patient ID: Richard Cox, male    DOB: Feb 11, 1981, 41 y.o.   MRN: [id number]  Mr. Starace is a 41 y/o  AA male diagnosed with HIV in May of 2020 with risk factor for acquiring HIV including MSM. No history of opportunistic infection or acute retroviral syndrome. Initial blood work for entry to care with CD4 of 188 and viral load of 65,200 (CDC Stage 3). Genotype was wild with no significant resistance. Sole medication regimen of Biktarvy.   Subjective:    No chief complaint on file.   HPI:  Richard Cox is a 41 y.o. male with HIV disease and heart failure last seen on 10/17/2021 with well-controlled virus and good adherence and tolerance to Biktarvy.  Viral load was undetectable with CD4 count of 271.Has been seen in the ED on several occasions with recommendations for admission secondary to shortness of breath and hypertension with BNP >4500 and has left AMA on each occasion. Provider notes indicate he was not taking his HIV medication. Here today for follow up.    Allergies  Allergen Reactions   Neosporin [Neomycin-Bacitracin Zn-Polymyx] Rash      Outpatient Medications Prior to Visit  Medication Sig Dispense Refill   acetaminophen (TYLENOL) 500 MG tablet Take 1,000 mg by mouth every 6 (six) hours as needed for moderate pain or headache.     aspirin 81 MG chewable tablet Chew 1 tablet (81 mg total) by mouth daily. 30 tablet 0   bictegravir-emtricitabine-tenofovir AF (BIKTARVY) 50-200-25 MG TABS tablet Take 1 tablet by mouth daily. 30 tablet 0   carvedilol (COREG) 12.5 MG tablet Take 1 tablet (12.5 mg total) by mouth 2 (two) times daily with a meal. 180 tablet 1   furosemide (LASIX) 40 MG tablet Take 1 tablet (40 mg total) by mouth daily as needed for fluid. 30 tablet 1   hydrALAZINE (APRESOLINE) 50 MG tablet Take 1 tablet (50 mg total) by mouth 3 (three) times daily. 90 tablet 5   hydrOXYzine (ATARAX) 25 MG tablet TAKE 1 TABLET BY MOUTH AT BEDTIME AS NEEDED FOR ANXIETY  AND ISOMNIA (Patient taking differently: Take 25 mg by mouth at bedtime as needed.) 30 tablet 0   losartan (COZAAR) 50 MG tablet Take 1 tablet (50 mg total) by mouth daily. 30 tablet 1   potassium chloride SA (KLOR-CON) 20 MEQ tablet Take 0.5 tablets (10 mEq total) by mouth daily as needed (when taking lasix). (Patient not taking: Reported on 08/13/2022) 30 tablet 1   Facility-Administered Medications Prior to Visit  Medication Dose Route Frequency Provider Last Rate Last Admin   0.9 %  sodium chloride infusion   Intravenous Continuous Derek Jack, MD       0.9 %  sodium chloride infusion   Intravenous Continuous Derek Jack, MD   Stopped at 09/26/20 1124   0.9 %  sodium chloride infusion   Intravenous Continuous Derek Jack, MD 500 mL/hr at 11/04/20 0935 Bolus from Bag at 11/04/20 0935   sodium chloride flush (NS) 0.9 % injection 10 mL  10 mL Intravenous PRN Derek Jack, MD   10 mL at 10/15/20 1035   sodium chloride flush (NS) 0.9 % injection 10 mL  10 mL Intravenous PRN Derek Jack, MD   10 mL at 11/07/20 1046     Past Medical History:  Diagnosis Date   Acute respiratory failure with hypoxia ([place]) 08/24/2021   CHF (congestive heart failure) ([place])    Endotracheally intubated 08/24/2021   Essential  hypertension    HIV infection ([place])    Lymphoma ([place]) 08/28/2020   Noncompliance with medication regimen    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 Nursing note and vital signs reviewed.  Physical Exam      10/17/2021    8:47 AM 10/06/2021    8:32 AM 09/10/2021    1:21 PM 01/27/2021    9:13 AM 09/10/2020   11:00 AM  Depression screen PHQ 2/9  Decreased Interest 0 0 0 0   Down, Depressed, Hopeless 0 2 0 0 1  PHQ - 2 Score 0 2  0 0 1  Altered sleeping 0 3   0  Tired, decreased energy 0 3   0  Change in appetite 0 3   0  Feeling bad or failure about yourself  0 0   0  Trouble concentrating 0 0   0  Moving slowly or fidgety/restless 0 0   0  Suicidal thoughts 0 0   0  PHQ-9 Score 0 11   1  Difficult doing work/chores  Somewhat difficult   Somewhat difficult       Assessment & Plan:    Patient Active Problem List   Diagnosis Date Noted   Hypertensive crisis 08/13/2022   Healthcare maintenance 10/17/2021   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   Acute bronchitis 10/06/2021   Hospital discharge follow-up 09/10/2021   Elevated troponin 08/24/2021   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) 09/18/2019   Alcohol abuse 09/18/2019   Tobacco abuse 09/18/2019   HTN (hypertension) 03/10/2019   Congestive heart failure ([place]) 03/10/2019   Noncompliance with medication regimen 03/10/2019     Problem List Items Addressed This Visit   None    I am having Rico Ala maintain his acetaminophen, aspirin, potassium chloride SA, carvedilol, hydrOXYzine, losartan, Biktarvy, furosemide, and hydrALAZINE.   No orders of the defined types were placed in this encounter.    Follow-up: No follow-ups on file.   Terri Piedra, MSN, FNP-C Nurse Practitioner [facility] number: [phone]

## 2022-08-20 ENCOUNTER — Ambulatory Visit: Payer: Medicaid Other | Admitting: Family

## 2022-08-26 ENCOUNTER — Ambulatory Visit: Payer: Medicaid Other | Admitting: Family Medicine

## 2022-08-28 ENCOUNTER — Other Ambulatory Visit: Payer: Self-pay | Admitting: Family

## 2022-08-28 DIAGNOSIS — B20 Human immunodeficiency virus [HIV] disease: Secondary | ICD-10-CM

## 2022-08-30 ENCOUNTER — Other Ambulatory Visit: Payer: Self-pay

## 2022-08-30 ENCOUNTER — Inpatient Hospital Stay (HOSPITAL_COMMUNITY): Payer: Medicaid Other

## 2022-08-30 ENCOUNTER — Encounter (HOSPITAL_COMMUNITY): Payer: Self-pay

## 2022-08-30 ENCOUNTER — Emergency Department (HOSPITAL_COMMUNITY): Payer: Medicaid Other

## 2022-08-30 ENCOUNTER — Inpatient Hospital Stay (HOSPITAL_COMMUNITY)
Admission: EM | Admit: 2022-08-30 | Discharge: 2022-09-01 | DRG: 974 | Disposition: A | Discharge status: Home / Self Care | Payer: Medicaid Other | Attending: Internal Medicine | Admitting: Internal Medicine

## 2022-08-30 DIAGNOSIS — J9621 Acute and chronic respiratory failure with hypoxia: Secondary | ICD-10-CM | POA: Diagnosis present

## 2022-08-30 DIAGNOSIS — I422 Other hypertrophic cardiomyopathy: Secondary | ICD-10-CM | POA: Diagnosis present

## 2022-08-30 DIAGNOSIS — C8510 Unspecified B-cell lymphoma, unspecified site: Secondary | ICD-10-CM | POA: Diagnosis present

## 2022-08-30 DIAGNOSIS — I5043 Acute on chronic combined systolic (congestive) and diastolic (congestive) heart failure: Secondary | ICD-10-CM | POA: Diagnosis present

## 2022-08-30 DIAGNOSIS — Z832 Family history of diseases of the blood and blood-forming organs and certain disorders involving the immune mechanism: Secondary | ICD-10-CM | POA: Diagnosis not present

## 2022-08-30 DIAGNOSIS — Z79899 Other long term (current) drug therapy: Secondary | ICD-10-CM | POA: Diagnosis not present

## 2022-08-30 DIAGNOSIS — R0602 Shortness of breath: Secondary | ICD-10-CM | POA: Diagnosis present

## 2022-08-30 DIAGNOSIS — R7989 Other specified abnormal findings of blood chemistry: Secondary | ICD-10-CM

## 2022-08-30 DIAGNOSIS — Z8249 Family history of ischemic heart disease and other diseases of the circulatory system: Secondary | ICD-10-CM

## 2022-08-30 DIAGNOSIS — Z833 Family history of diabetes mellitus: Secondary | ICD-10-CM

## 2022-08-30 DIAGNOSIS — F419 Anxiety disorder, unspecified: Secondary | ICD-10-CM | POA: Diagnosis present

## 2022-08-30 DIAGNOSIS — B20 Human immunodeficiency virus [HIV] disease: Secondary | ICD-10-CM | POA: Diagnosis present

## 2022-08-30 DIAGNOSIS — R509 Fever, unspecified: Secondary | ICD-10-CM

## 2022-08-30 DIAGNOSIS — Z91148 Patient's other noncompliance with medication regimen for other reason: Secondary | ICD-10-CM

## 2022-08-30 DIAGNOSIS — A419 Sepsis, unspecified organism: Principal | ICD-10-CM | POA: Diagnosis present

## 2022-08-30 DIAGNOSIS — Z8679 Personal history of other diseases of the circulatory system: Secondary | ICD-10-CM

## 2022-08-30 DIAGNOSIS — I110 Hypertensive heart disease with heart failure: Secondary | ICD-10-CM | POA: Diagnosis present

## 2022-08-30 DIAGNOSIS — J181 Lobar pneumonia, unspecified organism: Secondary | ICD-10-CM | POA: Diagnosis present

## 2022-08-30 DIAGNOSIS — Z1152 Encounter for screening for COVID-19: Secondary | ICD-10-CM

## 2022-08-30 DIAGNOSIS — Z881 Allergy status to other antibiotic agents status: Secondary | ICD-10-CM | POA: Diagnosis not present

## 2022-08-30 DIAGNOSIS — Z87891 Personal history of nicotine dependence: Secondary | ICD-10-CM

## 2022-08-30 DIAGNOSIS — Z7982 Long term (current) use of aspirin: Secondary | ICD-10-CM | POA: Diagnosis not present

## 2022-08-30 DIAGNOSIS — Z720 Tobacco use: Secondary | ICD-10-CM | POA: Diagnosis present

## 2022-08-30 DIAGNOSIS — I10 Essential (primary) hypertension: Secondary | ICD-10-CM | POA: Diagnosis present

## 2022-08-30 DIAGNOSIS — I2489 Other forms of acute ischemic heart disease: Secondary | ICD-10-CM | POA: Diagnosis present

## 2022-08-30 DIAGNOSIS — J189 Pneumonia, unspecified organism: Secondary | ICD-10-CM

## 2022-08-30 DIAGNOSIS — J9620 Acute and chronic respiratory failure, unspecified whether with hypoxia or hypercapnia: Principal | ICD-10-CM

## 2022-08-30 DIAGNOSIS — F1910 Other psychoactive substance abuse, uncomplicated: Secondary | ICD-10-CM | POA: Diagnosis present

## 2022-08-30 DIAGNOSIS — J9601 Acute respiratory failure with hypoxia: Secondary | ICD-10-CM | POA: Diagnosis not present

## 2022-08-30 LAB — URINALYSIS, ROUTINE W REFLEX MICROSCOPIC
Bacteria, UA: NONE SEEN
Bilirubin Urine: NEGATIVE
Glucose, UA: NEGATIVE mg/dL
Ketones, ur: NEGATIVE mg/dL
Leukocytes,Ua: NEGATIVE
Nitrite: NEGATIVE
Protein, ur: 100 mg/dL — AB
Specific Gravity, Urine: 1.016 (ref 1.005–1.030)
pH: 5 (ref 5.0–8.0)

## 2022-08-30 LAB — CBC WITH DIFFERENTIAL/PLATELET
Abs Immature Granulocytes: 0.1 10*3/uL — ABNORMAL HIGH (ref 0.00–0.07)
Basophils Absolute: 0 10*3/uL (ref 0.0–0.1)
Basophils Relative: 0 %
Eosinophils Absolute: 0 10*3/uL (ref 0.0–0.5)
Eosinophils Relative: 0 %
HCT: 37.3 % — ABNORMAL LOW (ref 39.0–52.0)
Hemoglobin: 12 g/dL — ABNORMAL LOW (ref 13.0–17.0)
Immature Granulocytes: 1 %
Lymphocytes Relative: 11 %
Lymphs Abs: 1.3 10*3/uL (ref 0.7–4.0)
MCH: 28.8 pg (ref 26.0–34.0)
MCHC: 32.2 g/dL (ref 30.0–36.0)
MCV: 89.7 fL (ref 80.0–100.0)
Monocytes Absolute: 1.2 10*3/uL — ABNORMAL HIGH (ref 0.1–1.0)
Monocytes Relative: 10 %
Neutro Abs: 9.4 10*3/uL — ABNORMAL HIGH (ref 1.7–7.7)
Neutrophils Relative %: 78 %
Platelets: 321 10*3/uL (ref 150–400)
RBC: 4.16 MIL/uL — ABNORMAL LOW (ref 4.22–5.81)
RDW: 17.3 % — ABNORMAL HIGH (ref 11.5–15.5)
WBC: 12 10*3/uL — ABNORMAL HIGH (ref 4.0–10.5)
nRBC: 0 % (ref 0.0–0.2)

## 2022-08-30 LAB — COMPREHENSIVE METABOLIC PANEL
ALT: 30 U/L (ref 0–44)
AST: 22 U/L (ref 15–41)
Albumin: 3.2 g/dL — ABNORMAL LOW (ref 3.5–5.0)
Alkaline Phosphatase: 106 U/L (ref 38–126)
Anion gap: 10 (ref 5–15)
BUN: 25 mg/dL — ABNORMAL HIGH (ref 6–20)
CO2: 31 mmol/L (ref 22–32)
Calcium: 8.8 mg/dL — ABNORMAL LOW (ref 8.9–10.3)
Chloride: 98 mmol/L (ref 98–111)
Creatinine, Ser: 1.43 mg/dL — ABNORMAL HIGH (ref 0.61–1.24)
GFR, Estimated: >60 mL/min (ref >60)
Glucose, Bld: 105 mg/dL — ABNORMAL HIGH (ref 70–99)
Potassium: 3.5 mmol/L (ref 3.5–5.1)
Sodium: 139 mmol/L (ref 135–145)
Total Bilirubin: 0.7 mg/dL (ref 0.3–1.2)
Total Protein: 7.8 g/dL (ref 6.5–8.1)

## 2022-08-30 LAB — LACTIC ACID, PLASMA
Lactic Acid, Venous: 0.7 mmol/L (ref 0.5–1.9)
Lactic Acid, Venous: 0.9 mmol/L (ref 0.5–1.9)

## 2022-08-30 LAB — RESP PANEL BY RT-PCR (FLU A&B, COVID) ARPGX2
Influenza A by PCR: NEGATIVE
Influenza B by PCR: NEGATIVE
SARS Coronavirus 2 by RT PCR: NEGATIVE

## 2022-08-30 LAB — RAPID URINE DRUG SCREEN, HOSP PERFORMED
Amphetamines: NOT DETECTED
Barbiturates: NOT DETECTED
Benzodiazepines: NOT DETECTED
Cocaine: POSITIVE — AB
Opiates: NOT DETECTED
Tetrahydrocannabinol: NOT DETECTED

## 2022-08-30 LAB — PROTIME-INR
INR: 1.2 (ref 0.8–1.2)
Prothrombin Time: 15.5 seconds — ABNORMAL HIGH (ref 11.4–15.2)

## 2022-08-30 LAB — PROCALCITONIN: Procalcitonin: 0.64 ng/mL

## 2022-08-30 LAB — TROPONIN I (HIGH SENSITIVITY)
Troponin I (High Sensitivity): 223 ng/L (ref <18)
Troponin I (High Sensitivity): 201 ng/L (ref <18)

## 2022-08-30 LAB — APTT: aPTT: 33 seconds (ref 24–36)

## 2022-08-30 LAB — BRAIN NATRIURETIC PEPTIDE: B Natriuretic Peptide: >4500 pg/mL — ABNORMAL HIGH (ref 0.0–100.0)

## 2022-08-30 MED ORDER — ONDANSETRON HCL 4 MG/2ML IJ SOLN: 4.0000 mg | Freq: Four times a day (QID) | INTRAMUSCULAR | Status: DC | PRN

## 2022-08-30 MED ORDER — AZITHROMYCIN 250 MG PO TABS
500.0000 mg | ORAL_TABLET | Freq: Every day | ORAL | Status: DC
  Administered 2022-08-30 – 2022-09-01 (×3): 500 mg via ORAL
  Filled 2022-08-30 (×3): qty 2

## 2022-08-30 MED ORDER — HYDROXYZINE HCL 25 MG PO TABS
25.0000 mg | ORAL_TABLET | Freq: Every evening | ORAL | Status: DC | PRN
  Administered 2022-08-30: 25 mg via ORAL
  Filled 2022-08-30: qty 1

## 2022-08-30 MED ORDER — BICTEGRAVIR-EMTRICITAB-TENOFOV 50-200-25 MG PO TABS
1.0000 | ORAL_TABLET | Freq: Every day | ORAL | Status: DC
  Administered 2022-08-30 – 2022-09-01 (×3): 1 via ORAL
  Filled 2022-08-30 (×4): qty 1

## 2022-08-30 MED ORDER — SODIUM CHLORIDE 0.9% FLUSH
3.0000 mL | Freq: Two times a day (BID) | INTRAVENOUS | Status: DC
  Administered 2022-08-30 – 2022-09-01 (×4): 3 mL via INTRAVENOUS

## 2022-08-30 MED ORDER — ALBUTEROL SULFATE (2.5 MG/3ML) 0.083% IN NEBU
INHALATION_SOLUTION | RESPIRATORY_TRACT | Status: AC
  Administered 2022-08-30: 2.5 mg
  Filled 2022-08-30: qty 3

## 2022-08-30 MED ORDER — SODIUM CHLORIDE 0.9 % IV SOLN
500.0000 mg | Freq: Once | INTRAVENOUS | Status: AC
  Administered 2022-08-30: 500 mg via INTRAVENOUS
  Filled 2022-08-30: qty 5

## 2022-08-30 MED ORDER — SODIUM CHLORIDE 0.9 % IV SOLN
1.0000 g | INTRAVENOUS | Status: DC
  Administered 2022-08-30 – 2022-08-31 (×2): 1 g via INTRAVENOUS
  Filled 2022-08-30 (×2): qty 10

## 2022-08-30 MED ORDER — CARVEDILOL 12.5 MG PO TABS
12.5000 mg | ORAL_TABLET | Freq: Two times a day (BID) | ORAL | Status: DC
  Administered 2022-08-31 – 2022-09-01 (×3): 12.5 mg via ORAL
  Filled 2022-08-30 (×3): qty 1

## 2022-08-30 MED ORDER — HYDRALAZINE HCL 25 MG PO TABS
50.0000 mg | ORAL_TABLET | Freq: Three times a day (TID) | ORAL | Status: DC
  Administered 2022-08-30 – 2022-09-01 (×5): 50 mg via ORAL
  Filled 2022-08-30 (×5): qty 2

## 2022-08-30 MED ORDER — LABETALOL HCL 5 MG/ML IV SOLN
5.0000 mg | Freq: Four times a day (QID) | INTRAVENOUS | Status: DC | PRN
  Administered 2022-08-30: 5 mg via INTRAVENOUS
  Filled 2022-08-30 (×2): qty 4

## 2022-08-30 MED ORDER — SODIUM CHLORIDE 0.9% FLUSH: 3.0000 mL | INTRAVENOUS | Status: DC | PRN

## 2022-08-30 MED ORDER — SODIUM CHLORIDE 0.9 % IV SOLN
1.0000 g | Freq: Once | INTRAVENOUS | Status: AC
  Administered 2022-08-30: 1 g via INTRAVENOUS
  Filled 2022-08-30: qty 10

## 2022-08-30 MED ORDER — ACETAMINOPHEN 325 MG PO TABS
650.0000 mg | ORAL_TABLET | ORAL | Status: DC | PRN
  Administered 2022-08-31 – 2022-09-01 (×2): 650 mg via ORAL
  Filled 2022-08-30 (×2): qty 2

## 2022-08-30 MED ORDER — IPRATROPIUM-ALBUTEROL 0.5-2.5 (3) MG/3ML IN SOLN
3.0000 mL | Freq: Once | RESPIRATORY_TRACT | Status: AC
  Administered 2022-08-30: 3 mL via RESPIRATORY_TRACT
  Filled 2022-08-30: qty 3

## 2022-08-30 MED ORDER — ACETAMINOPHEN 325 MG PO TABS
650.0000 mg | ORAL_TABLET | Freq: Once | ORAL | Status: AC
  Administered 2022-08-30: 650 mg via ORAL
  Filled 2022-08-30: qty 2

## 2022-08-30 MED ORDER — FUROSEMIDE 10 MG/ML IJ SOLN
40.0000 mg | Freq: Every day | INTRAMUSCULAR | Status: DC
  Administered 2022-08-30 – 2022-09-01 (×3): 40 mg via INTRAVENOUS
  Filled 2022-08-30 (×3): qty 4

## 2022-08-30 MED ORDER — SODIUM CHLORIDE 0.9 % IV SOLN: 250.0000 mL | INTRAVENOUS | Status: DC | PRN

## 2022-08-30 MED ORDER — ENOXAPARIN SODIUM 40 MG/0.4ML IJ SOSY
40.0000 mg | PREFILLED_SYRINGE | INTRAMUSCULAR | Status: DC
  Administered 2022-08-30 – 2022-08-31 (×2): 40 mg via SUBCUTANEOUS
  Filled 2022-08-30 (×2): qty 0.4

## 2022-08-30 MED ORDER — ASPIRIN 81 MG PO CHEW
81.0000 mg | CHEWABLE_TABLET | Freq: Every day | ORAL | Status: DC
  Administered 2022-08-30 – 2022-09-01 (×3): 81 mg via ORAL
  Filled 2022-08-30 (×3): qty 1

## 2022-08-30 NOTE — ED Notes (Signed)
Pt has been pleasant and cooperative

## 2022-08-30 NOTE — H&P (Signed)
History and Physical    Patient: Richard Cox ZOX:[id number] DOB: 04-27-1981 DOA: 08/30/2022 DOS: the patient was seen and examined on 08/30/2022 PCP: Pcp, No  Patient coming from: Home  Chief Complaint:  Chief Complaint  Patient presents with   Shortness of Breath   HPI: Richard Cox is a 41 year old male with a history of septal hypertrophy, possible HOCM, HIV, systolic and diastolic CHF, hypertension, polysubstance abuse presenting with 1 week history of shortness of breath and coughing that significantly worsened in the last 24 hours prior to admission.  He had some subjective fevers and chills at home.  His cough is essentially nonproductive.  He denies any nausea, vomiting, diarrhea, abdominal pain, dysuria, hematuria, headache.  The patient endorses compliance with all his medications.  He last used cocaine about 1 week prior to this admission.  He continues to smoke 1/2 pack/day.  He denies any alcohol.  Notably, the patient was recently admitted to the hospital on 08/13/2022 for acute on chronic combined CHF.  He left AGAINST MEDICAL ADVICE.  In addition, the patient also had an ED visit on 08/01/2022.  He had decompensated CHF at that time.  He left AMA after receiving a dose of intravenous furosemide. In the ED, the patient was febrile up to 100.9 F.  He was tachycardic with heart rate 100-1 10.  He was hemodynamically stable with blood pressure up to 166/107.  Oxygen saturation was 89% on room air.  The patient was started on ceftriaxone and azithromycin.  Review of Systems: As mentioned in the history of present illness. All other systems reviewed and are negative. Past Medical History:  Diagnosis Date   Acute respiratory failure with hypoxia ([place]) 08/24/2021   CHF (congestive heart failure) ([facility])    Endotracheally intubated 08/24/2021   Essential hypertension    HIV infection ([place])    Lymphoma ([place]) 08/28/2020   Noncompliance with medication regimen    Secondary cardiomyopathy  [facility])    Past Surgical History:  Procedure Laterality Date   IR IMAGING GUIDED PORT INSERTION  08/23/2020   Right   NO PAST SURGERIES     Social History:  reports that he has quit smoking. His smoking use included cigarettes. He smoked an average of .25 packs per day. He has never used smokeless tobacco. He reports that he does not currently use alcohol. He reports current drug use. Drug: Marijuana.  Allergies  Allergen Reactions   Neosporin [Neomycin-Bacitracin Zn-Polymyx] Rash    Family History  Problem Relation Age of Onset   Diabetes Mellitus II Mother    Cancer Mother        multiple myeloma   Cancer Father        multiple myeloma   Congestive Heart Failure Brother    Heart Problems Brother    Lupus Brother     Prior to Admission medications   Medication Sig Start Date End Date Taking? Authorizing Provider  acetaminophen (TYLENOL) 500 MG tablet Take 1,000 mg by mouth every 6 (six) hours as needed for moderate pain or headache.    [provider]  aspirin 81 MG chewable tablet Chew 1 tablet (81 mg total) by mouth daily. 08/28/21   Geradine Girt, DO  BIKTARVY 50-200-25 MG TABS tablet TAKE 1 TABLET BY MOUTH DAILY 08/28/22   Golden Circle, FNP  carvedilol (COREG) 12.5 MG tablet Take 1 tablet (12.5 mg total) by mouth 2 (two) times daily with a meal. 12/31/21   Lindell Spar, MD  furosemide (LASIX)  40 MG tablet Take 1 tablet (40 mg total) by mouth daily as needed for fluid. 08/01/22 11/29/22  Davonna Belling, MD  hydrALAZINE (APRESOLINE) 50 MG tablet Take 1 tablet (50 mg total) by mouth 3 (three) times daily. 08/01/22   Davonna Belling, MD  hydrOXYzine (ATARAX) 25 MG tablet TAKE 1 TABLET BY MOUTH AT BEDTIME AS NEEDED FOR ANXIETY AND ISOMNIA Patient taking differently: Take 25 mg by mouth at bedtime as needed. 03/11/22   Lindell Spar, MD  losartan (COZAAR) 50 MG tablet Take 1 tablet (50 mg total) by mouth daily. 05/31/22   Sherwood Gambler, MD  potassium chloride  SA (KLOR-CON) 20 MEQ tablet Take 0.5 tablets (10 mEq total) by mouth daily as needed (when taking lasix). Patient not taking: Reported on 08/13/2022 08/27/21   Geradine Girt, DO    Physical Exam: Vitals:   08/30/22 0559 08/30/22 0600 08/30/22 0739 08/30/22 0740  BP:  (!) 178/125 (!) 166/107   Pulse:  (!) 112 (!) 102   Resp:  (!) 24 16   Temp:    100 F (37.8 C)  TempSrc:    Oral  SpO2: 95% 100% 97%   Weight:      Height:       GENERAL:  A&O x 3, NAD, well developed, cooperative, follows commands HEENT: [place]/AT, No thrush, No icterus, No oral ulcers Neck:  No neck mass, No meningismus, soft, supple CV: RRR, no S3, no S4, no rub, no JVD Lungs:  bibasilar rales L>R Abd: soft/NT +BS, nondistended Ext: No edema, no lymphangitis, no cyanosis, no rashes Neuro:  CN II-XII intact, strength 4/5 in RUE, RLE, strength 4/5 LUE, LLE; sensation intact bilateral; no dysmetria; babinski equivocal  Data Reviewed: Result noted above in history  Assessment and Plan: Sepsis -Present on admission -Presented with tachycardia, fever, leukocytosis -Secondary to pneumonia -Lactic acid 0.7>> 0.9 -Limiting IV fluids secondary to CHF  Community-acquired pneumonia -Urine Legionella antigen -Continue ceftriaxone and azithromycin -Check PCT -COVID-19 PCR negative -Viral respiratory panel  Elevated troponin -Secondary to demand ischemia -No chest pain presently -Update echo  Acute on chronic combined CHF -08/24/2021 echo--severe asymmetric septal hypertrophy, no LVOT, EF 45-50%, global HK, grade 2 DD -Start IV furosemide -Daily weights -Accurate I's and O's  HIV -10/17/2021 HIV RNA undetectable -10/17/2021 CD4 count 271 -Continue Biktarvy  B-cell lymphoma -Follow-up with Dr. Delton Coombes  Polysubstance abuse -Last used cocaine 1 week prior to this admission -Continues to smoke tobacco -Cessation discussed  Uncontrolled hypertension -Restart carvedilol and hydralazine  Septal  hypertrophy -There has been concerns for possible HOCM -Avoid hypotension -Prefer beta-blockade    Advance Care Planning: FULL  Consults: none  Family Communication: none  Severity of Illness: The appropriate patient status for this patient is INPATIENT. Inpatient status is judged to be reasonable and necessary in order to provide the required intensity of service to ensure the patient's safety. The patient's presenting symptoms, physical exam findings, and initial radiographic and laboratory data in the context of their chronic comorbidities is felt to place them at high risk for further clinical deterioration. Furthermore, it is not anticipated that the patient will be medically stable for discharge from the hospital within 2 midnights of admission.   * I certify that at the point of admission it is my clinical judgment that the patient will require inpatient hospital care spanning beyond 2 midnights from the point of admission due to high intensity of service, high risk for further deterioration and high frequency of surveillance required.*  Author: Orson Eva, MD 08/30/2022 9:53 AM  For on call review [email or web address].

## 2022-08-30 NOTE — ED Notes (Signed)
RT at bedside.

## 2022-08-30 NOTE — ED Notes (Signed)
Pt given dinner tray.

## 2022-08-30 NOTE — ED Provider Notes (Signed)
[facility] EMERGENCY DEPARTMENT  Provider Note  CSN: [id number] Arrival date & time: 08/30/22 0458  History Chief Complaint  Patient presents with   Shortness of Breath    Richard Cox is a 41 y.o. male with complex history of HIV/AIDS, hypertrophic cardiomyopathy, combined diastolic and systolic HF and lymphoma here for increasing SOB. He has frequent ED visits for similar, typically improves in the ED or leaves AMA to avoid admission. He reports several weeks of increasing SOB, dry cough worse in the last few hours. He reports compliance with his diuretics but noted to have a large jug of orange juice with him so unsure if his overall fluid balance has been positive recently.   Home Medications Prior to Admission medications   Medication Sig Start Date End Date Taking? Authorizing Provider  acetaminophen (TYLENOL) 500 MG tablet Take 1,000 mg by mouth every 6 (six) hours as needed for moderate pain or headache.    [provider]  aspirin 81 MG chewable tablet Chew 1 tablet (81 mg total) by mouth daily. 08/28/21   Geradine Girt, DO  BIKTARVY 50-200-25 MG TABS tablet TAKE 1 TABLET BY MOUTH DAILY 08/28/22   Golden Circle, FNP  carvedilol (COREG) 12.5 MG tablet Take 1 tablet (12.5 mg total) by mouth 2 (two) times daily with a meal. 12/31/21   Lindell Spar, MD  furosemide (LASIX) 40 MG tablet Take 1 tablet (40 mg total) by mouth daily as needed for fluid. 08/01/22 11/29/22  Davonna Belling, MD  hydrALAZINE (APRESOLINE) 50 MG tablet Take 1 tablet (50 mg total) by mouth 3 (three) times daily. 08/01/22   Davonna Belling, MD  hydrOXYzine (ATARAX) 25 MG tablet TAKE 1 TABLET BY MOUTH AT BEDTIME AS NEEDED FOR ANXIETY AND ISOMNIA Patient taking differently: Take 25 mg by mouth at bedtime as needed. 03/11/22   Lindell Spar, MD  losartan (COZAAR) 50 MG tablet Take 1 tablet (50 mg total) by mouth daily. 05/31/22   Sherwood Gambler, MD  potassium chloride SA (KLOR-CON) 20 MEQ tablet Take  0.5 tablets (10 mEq total) by mouth daily as needed (when taking lasix). Patient not taking: Reported on 08/13/2022 08/27/21   Geradine Girt, DO     Allergies    Neosporin [neomycin-bacitracin zn-polymyx]   Review of Systems   Review of Systems Please see HPI for pertinent positives and negatives  Physical Exam BP (!) 178/125   Pulse (!) 112   Temp (!) 100.9 F (38.3 C) (Oral)   Resp (!) 24   Ht '6\' 1"'$  (1.854 m)   Wt 74.8 kg   SpO2 100%   BMI 21.76 kg/m   Physical Exam Vitals and nursing note reviewed.  Constitutional:      Appearance: Normal appearance.  HENT:     Head: Normocephalic and atraumatic.     Nose: Nose normal.     Mouth/Throat:     Mouth: Mucous membranes are moist.  Eyes:     Extraocular Movements: Extraocular movements intact.     Conjunctiva/sclera: Conjunctivae normal.  Cardiovascular:     Rate and Rhythm: Tachycardia present.  Pulmonary:     Effort: Accessory muscle usage present.     Breath sounds: Decreased breath sounds, wheezing and rales present.  Abdominal:     General: Abdomen is flat.     Palpations: Abdomen is soft.     Tenderness: There is no abdominal tenderness.  Musculoskeletal:        General: No swelling. Normal range  of motion.     Cervical back: Neck supple.     Right lower leg: No edema.     Left lower leg: No edema.  Skin:    General: Skin is warm and dry.  Neurological:     General: No focal deficit present.     Mental Status: He is alert.  Psychiatric:        Mood and Affect: Mood normal.     ED Results / Procedures / Treatments   EKG EKG Interpretation  Date/Time:  Sunday August 30 2022 05:11:48 EDT Ventricular Rate:  109 PR Interval:  190 QRS Duration: 98 QT Interval:  334 QTC Calculation: 450 R Axis:   71 Text Interpretation: Sinus tachycardia Biatrial enlargement Left ventricular hypertrophy Anterior Q waves, possibly due to LVH Abnormal T, consider ischemia, diffuse leads No significant change since  last tracing Confirmed by Calvert Cantor [phone]) on 08/30/2022 5:23:15 AM  Procedures Procedures  Medications Ordered in the ED Medications  cefTRIAXone (ROCEPHIN) 1 g in sodium chloride 0.9 % 100 mL IVPB (has no administration in time range)  azithromycin (ZITHROMAX) 500 mg in sodium chloride 0.9 % 250 mL IVPB (has no administration in time range)  ipratropium-albuterol (DUONEB) 0.5-2.5 (3) MG/3ML nebulizer solution 3 mL (3 mLs Nebulization Given 08/30/22 0559)  albuterol (PROVENTIL) (2.5 MG/3ML) 0.083% nebulizer solution (2.5 mg  Given 08/30/22 0558)  albuterol (PROVENTIL) (2.5 MG/3ML) 0.083% nebulizer solution (2.5 mg  Given 08/30/22 1517)  acetaminophen (TYLENOL) tablet 650 mg (650 mg Oral Given 08/30/22 0653)    Initial Impression and Plan Patient here with multifactorial SOB. States he has been given nebs when at the hospital before but hasn't used nebs or inhalers at home. He was noted to be febrile and tachycardic on arrival with borderline low SpO2 and increased respiratory effort. Will begin sepsis orders pending identification of a source. Given his recent history of CHF exacerbations, will avoid fluid resuscitation until labs have resulted. BP is adequate (actually high) at this time.   ED Course   Clinical Course as of 08/30/22 6160  Nancy Fetter Aug 30, 2022  0559 I personally viewed the images from radiology studies and agree with radiologist interpretation:  CXR with edema and cardiomegaly, no definite infiltrate.  [CS]  0626 CBC with mild leukocytosis, anemia is at baseline.  [CS]  7371 Covid/Flu are neg.  [CS]  0626 Coags are normal.  [CS]  0641 Lactic acid is neg.  [CS]  0700 CMP with CKD about at baseline. Initial Trop is 223, increased from prior visits where baseline appears to be about 150. Given cough, fever and leukocytosis with negative Covid will begin Abx for empiric coverage of CAP. Also lasix for pulm edema. Will need admission when remainder of labs resulted.  Patient is amenable to that plan.  [CS]  [phone] Care of the patient signed out to Dr. Rogene Houston at the change of shift.  [CS]    Clinical Course User Index [CS] Truddie Hidden, MD     MDM Rules/Calculators/A&P Medical Decision Making Problems Addressed: Acute on chronic combined systolic and diastolic CHF (congestive heart failure) [facility]): acute illness or injury Acute on chronic respiratory failure, unspecified whether with hypoxia or hypercapnia [facility]): acute illness or injury Elevated troponin: acute illness or injury Febrile illness: acute illness or injury History of cardiomyopathy: chronic illness or injury with exacerbation, progression, or side effects of treatment  Amount and/or Complexity of Data Reviewed Labs: ordered. Decision-making details documented in ED Course. Radiology: ordered and independent  interpretation performed. Decision-making details documented in ED Course. ECG/medicine tests: ordered and independent interpretation performed. Decision-making details documented in ED Course.  Risk OTC drugs. Prescription drug management. Decision regarding hospitalization.    Final Clinical Impression(s) / ED Diagnoses Final diagnoses:  Acute on chronic respiratory failure, unspecified whether with hypoxia or hypercapnia (HCC)  Febrile illness  History of cardiomyopathy  Acute on chronic combined systolic and diastolic CHF (congestive heart failure) ([place])  Elevated troponin    Rx / DC Orders ED Discharge Orders     None        Truddie Hidden, MD 08/30/22 [phone]

## 2022-08-30 NOTE — ED Notes (Signed)
Pt irritable and cursing at staff. States he does not wish to stay here. This RN informed pt we cannot keep him against his will. Pt still unhappy, pt given water and fresh urinal. Pt informed meal tray would be brought as soon as they are brought up. Pt stated he did not wish to eat hospital food, said he would order something to be delivered. Call light given to pt, bed locked in lowest position. Curtain closed to provide privacy.

## 2022-08-30 NOTE — Hospital Course (Signed)
41 year old male with a history of septal hypertrophy, possible HOCM, HIV, systolic and diastolic CHF, hypertension, polysubstance abuse presenting with 1 week history of shortness of breath and coughing that significantly worsened in the last 24 hours prior to admission.  He had some subjective fevers and chills at home.  His cough is essentially nonproductive.  He denies any nausea, vomiting, diarrhea, abdominal pain, dysuria, hematuria, headache.  The patient endorses compliance with all his medications.  He last used cocaine about 1 week prior to this admission.  He continues to smoke 1/2 pack/day.  He denies any alcohol.  Notably, the patient was recently admitted to the hospital on 08/13/2022 for acute on chronic combined CHF.  He left AGAINST MEDICAL ADVICE.  In addition, the patient also had an ED visit on 08/01/2022.  He had decompensated CHF at that time.  He left AMA after receiving a dose of intravenous furosemide. In the ED, the patient was febrile up to 100.9 F.  He was tachycardic with heart rate 100-1 10.  He was hemodynamically stable with blood pressure up to 166/107.  Oxygen saturation was 89% on room air.  The patient was started on ceftriaxone and azithromycin.

## 2022-08-30 NOTE — ED Notes (Signed)
Patient transported to CT 

## 2022-08-30 NOTE — ED Triage Notes (Signed)
Pt arrived via POV from home c/o SOB that began 1 hr PTA. Pt seen here recently for same. Hx CHF.

## 2022-08-31 ENCOUNTER — Inpatient Hospital Stay (HOSPITAL_COMMUNITY): Payer: Medicaid Other

## 2022-08-31 DIAGNOSIS — I5043 Acute on chronic combined systolic (congestive) and diastolic (congestive) heart failure: Secondary | ICD-10-CM

## 2022-08-31 DIAGNOSIS — A419 Sepsis, unspecified organism: Secondary | ICD-10-CM | POA: Diagnosis not present

## 2022-08-31 DIAGNOSIS — R7989 Other specified abnormal findings of blood chemistry: Secondary | ICD-10-CM | POA: Diagnosis not present

## 2022-08-31 DIAGNOSIS — J9601 Acute respiratory failure with hypoxia: Secondary | ICD-10-CM | POA: Diagnosis not present

## 2022-08-31 DIAGNOSIS — B20 Human immunodeficiency virus [HIV] disease: Secondary | ICD-10-CM

## 2022-08-31 DIAGNOSIS — Z91148 Patient's other noncompliance with medication regimen for other reason: Secondary | ICD-10-CM

## 2022-08-31 DIAGNOSIS — Z720 Tobacco use: Secondary | ICD-10-CM

## 2022-08-31 LAB — BASIC METABOLIC PANEL
Anion gap: 11 (ref 5–15)
BUN: 33 mg/dL — ABNORMAL HIGH (ref 6–20)
CO2: 30 mmol/L (ref 22–32)
Calcium: 8.6 mg/dL — ABNORMAL LOW (ref 8.9–10.3)
Chloride: 97 mmol/L — ABNORMAL LOW (ref 98–111)
Creatinine, Ser: 1.45 mg/dL — ABNORMAL HIGH (ref 0.61–1.24)
GFR, Estimated: >60 mL/min (ref >60)
Glucose, Bld: 115 mg/dL — ABNORMAL HIGH (ref 70–99)
Potassium: 3 mmol/L — ABNORMAL LOW (ref 3.5–5.1)
Sodium: 138 mmol/L (ref 135–145)

## 2022-08-31 LAB — CBC
HCT: 32.9 % — ABNORMAL LOW (ref 39.0–52.0)
Hemoglobin: 10.5 g/dL — ABNORMAL LOW (ref 13.0–17.0)
MCH: 28.6 pg (ref 26.0–34.0)
MCHC: 31.9 g/dL (ref 30.0–36.0)
MCV: 89.6 fL (ref 80.0–100.0)
Platelets: 269 10*3/uL (ref 150–400)
RBC: 3.67 MIL/uL — ABNORMAL LOW (ref 4.22–5.81)
RDW: 17.2 % — ABNORMAL HIGH (ref 11.5–15.5)
WBC: 7.8 10*3/uL (ref 4.0–10.5)
nRBC: 0 % (ref 0.0–0.2)

## 2022-08-31 LAB — RESPIRATORY PANEL BY PCR

## 2022-08-31 LAB — ECHOCARDIOGRAM COMPLETE
AR max vel: 4.47 cm2
AV Area VTI: 4.16 cm2
AV Area mean vel: 4.34 cm2
AV Mean grad: 3 mmHg
AV Peak grad: 5.1 mmHg
Ao pk vel: 1.13 m/s
Area-P 1/2: 5.62 cm2
Height: 73 in
MV VTI: 3.76 cm2
S' Lateral: 3.5 cm
Weight: 2638.47 oz

## 2022-08-31 MED ORDER — LIVING BETTER WITH HEART FAILURE BOOK: Freq: Once | Status: AC

## 2022-08-31 MED ORDER — IPRATROPIUM-ALBUTEROL 0.5-2.5 (3) MG/3ML IN SOLN
3.0000 mL | Freq: Four times a day (QID) | RESPIRATORY_TRACT | Status: DC
  Administered 2022-08-31 – 2022-09-01 (×4): 3 mL via RESPIRATORY_TRACT
  Filled 2022-08-31 (×4): qty 3

## 2022-08-31 MED ORDER — CHLORHEXIDINE GLUCONATE CLOTH 2 % EX PADS
6.0000 | MEDICATED_PAD | Freq: Every day | CUTANEOUS | Status: DC
  Administered 2022-08-31: 6 via TOPICAL

## 2022-08-31 MED ORDER — BUDESONIDE 0.5 MG/2ML IN SUSP
0.5000 mg | Freq: Two times a day (BID) | RESPIRATORY_TRACT | Status: DC
  Administered 2022-08-31 – 2022-09-01 (×2): 0.5 mg via RESPIRATORY_TRACT
  Filled 2022-08-31 (×2): qty 2

## 2022-08-31 MED ORDER — POTASSIUM CHLORIDE CRYS ER 20 MEQ PO TBCR
40.0000 meq | EXTENDED_RELEASE_TABLET | Freq: Once | ORAL | Status: AC
  Administered 2022-08-31: 40 meq via ORAL
  Filled 2022-08-31: qty 2

## 2022-08-31 MED ORDER — LABETALOL HCL 5 MG/ML IV SOLN
5.0000 mg | Freq: Once | INTRAVENOUS | Status: AC | PRN
  Administered 2022-08-31: 5 mg via INTRAVENOUS
  Filled 2022-08-31: qty 4

## 2022-08-31 NOTE — Progress Notes (Signed)
*  PRELIMINARY RESULTS* Echocardiogram 2D Echocardiogram has been performed.  Richard Cox 08/31/2022, 2:17 PM

## 2022-08-31 NOTE — TOC Initial Note (Signed)
Transition of Care [facility]) - Initial/Assessment Note    Patient Details  Name: Richard Cox MRN: [id number] Date of Birth: 1981-09-01  Transition of Care [facility]) CM/SW Contact:    Salome Arnt, [place] Phone Number: 08/31/2022, 8:44 AM  Clinical Narrative: Pt admitted due to sepsis. Assessment completed with pt. He states he lives alone and is independent with ADLs. Pt has transportation to appointments. He plans to return home when medically stable and reports no needs.   CHF screening completed with pt. Pt indicates he was diagnosed with CHF about 2 years ago. He states he has a scale, but does not weigh himself daily. Pt said he understands importance of daily weights. When asked about following a heart healthy diet, pt states, "I try to if my budget allows, but I eat what I can." He reports taking medications as prescribed. Pt recently qualified for Medicaid and has new PCP appointment scheduled next month. Will order Living Well with CHF book for pt.                    Expected Discharge Plan: Home/Self Care Barriers to Discharge: Continued Medical Work up   Patient Goals and CMS Choice Patient states their goals for this hospitalization and ongoing recovery are:: return home   Choice offered to / list presented to : Patient  Expected Discharge Plan and Services Expected Discharge Plan: Home/Self Care In-house Referral: Clinical Social Work     Living arrangements for the past 2 months: Single Family Home                                      Prior Living Arrangements/Services Living arrangements for the past 2 months: Single Family Home Lives with:: Self Patient language and need for interpreter reviewed:: Yes Do you feel safe going back to the place where you live?: Yes      Need for Family Participation in Patient Care: No (Comment)     Criminal Activity/Legal Involvement Pertinent to Current Situation/Hospitalization: No - Comment as needed  Activities  of Daily Living Home Assistive Devices/Equipment: None ADL Screening (condition at time of admission) Patient's cognitive ability adequate to safely complete daily activities?: Yes Is the patient deaf or have difficulty hearing?: No Does the patient have difficulty seeing, even when wearing glasses/contacts?: No Does the patient have difficulty concentrating, remembering, or making decisions?: No Patient able to express need for assistance with ADLs?: Yes Does the patient have difficulty dressing or bathing?: No Independently performs ADLs?: Yes (appropriate for developmental age) Does the patient have difficulty walking or climbing stairs?: No Weakness of Legs: None Weakness of Arms/Hands: None  Permission Sought/Granted                  Emotional Assessment     Affect (typically observed): Appropriate Orientation: : Oriented to Self, Oriented to Place, Oriented to  Time, Oriented to Situation   Psych Involvement: No (comment)  Admission diagnosis:  Elevated troponin [R79.89] History of cardiomyopathy [Z86.79] Febrile illness [R50.9] Acute on chronic combined systolic and diastolic CHF (congestive heart failure) ([place]) [I50.43] Sepsis due to undetermined organism ([place]) [A41.9] Acute on chronic respiratory failure, unspecified whether with hypoxia or hypercapnia ([place]) [J96.20] Patient Active Problem List   Diagnosis Date Noted   Sepsis due to undetermined organism ([place]) 08/30/2022   CAP (community acquired pneumonia) 08/30/2022   Hypertensive crisis 08/13/2022   Healthcare maintenance  10/17/2021   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   Acute bronchitis 10/06/2021   Hospital discharge follow-up 09/10/2021   Acute respiratory failure with hypoxia (HCC) 08/24/2021   Elevated troponin 08/24/2021   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   Intra-abdominal lymphadenopathy 07/30/2020   Generalized lymphadenopathy 07/25/2020   Varicella zoster 01/04/2020   AIDS (acquired immune  deficiency syndrome) ([place]) 10/10/2019   Acute on chronic combined systolic and diastolic CHF (congestive heart failure) ([place]) 09/18/2019   HIV (human immunodeficiency virus infection) ([place]) 09/18/2019   Substance abuse ([place]) 09/18/2019   Alcohol abuse 09/18/2019   Tobacco abuse 09/18/2019   HTN (hypertension) 03/10/2019   Congestive heart failure ([place]) 03/10/2019   Noncompliance with medication regimen 03/10/2019   PCP:  Pcp, No Pharmacy:   [place], [place] - [address] [place] [address] Phone: [phone] Fax: [phone]     Social Determinants of Health (SDOH) Interventions Housing Interventions: Intervention Not Indicated  Readmission Risk Interventions    08/31/2022    8:43 AM 08/27/2021    3:31 PM  Readmission Risk Prevention Plan  Transportation Screening Complete Complete  Medication Review (RN Care Manager) Complete   PCP or Specialist appointment within 3-5 days of discharge  Not Complete  PCP/Specialist Appt Not Complete comments  Left message with Dr Posey Pronto, waiting for call back.  [place] or Home Care Consult Complete Complete  SW Recovery Care/Counseling Consult Complete Complete  Palliative Care Screening Not Applicable Not Applicable  Skilled Nursing Facility Not Applicable Not Applicable

## 2022-08-31 NOTE — ED Notes (Signed)
MD made aware of patient BP. Too soon for PRN Labetalol. Awaiting new orders at this time. SWOT nurse states that she will take patient upstairs at this time.

## 2022-08-31 NOTE — ED Notes (Signed)
This RN notified of pts persistent elevated BP. This RN released S&H orders. Primary RN notified. Dr. Josephine Cables made aware.

## 2022-08-31 NOTE — Progress Notes (Signed)
PROGRESS NOTE  Richard Cox PIR:[id number] DOB: 1981/09/01 DOA: 08/30/2022 PCP: Pcp, No  Brief History:  41 year old male with a history of septal hypertrophy, possible HOCM, HIV, systolic and diastolic CHF, hypertension, polysubstance abuse presenting with 1 week history of shortness of breath and coughing that significantly worsened in the last 24 hours prior to admission.  He had some subjective fevers and chills at home.  His cough is essentially nonproductive.  He denies any nausea, vomiting, diarrhea, abdominal pain, dysuria, hematuria, headache.  The patient endorses compliance with all his medications.  He last used cocaine about 1 week prior to this admission.  He continues to smoke 1/2 pack/day.  He denies any alcohol.  Notably, the patient was recently admitted to the hospital on 08/13/2022 for acute on chronic combined CHF.  He left AGAINST MEDICAL ADVICE.  In addition, the patient also had an ED visit on 08/01/2022.  He had decompensated CHF at that time.  He left AMA after receiving a dose of intravenous furosemide. In the ED, the patient was febrile up to 100.9 F.  He was tachycardic with heart rate 100-1 10.  He was hemodynamically stable with blood pressure up to 166/107.  Oxygen saturation was 89% on room air.  The patient was started on ceftriaxone and azithromycin.   Assessment/Plan:  Sepsis -Present on admission -Presented with tachycardia, fever, leukocytosis -Secondary to pneumonia -Lactic acid 0.7>> 0.9 -Limiting IV fluids secondary to CHF   Lobar Pneumonia -Urine Legionella antigen -Continue ceftriaxone and azithromycin -Check PCT 0.64 -COVID-19 PCR negative -Viral respiratory panel-->+rhinovirus/enterovirus -10/22 CT chest--Diffuse bronchial wall thickening with some patchy areas of thickening of the peribronchovascular interstitium, many of which demonstrate associated ground-glass attenuation and surrounding peribronchovascular micro and  macronodularity -Add bronchodilators   Elevated troponin -Secondary to demand ischemia -No chest pain presently -Update echo   Acute on chronic combined CHF -08/24/2021 echo--severe asymmetric septal hypertrophy, no LVOT, EF 45-50%, global HK, grade 2 DD -Start IV furosemide -Daily weights -Accurate I's and O's   HIV -10/17/2021 HIV RNA undetectable -10/17/2021 CD4 count 271 -Continue Biktarvy>>pt endorses compliance except for 4 days 2 weeks ago when there was a delay getting the meds -recheck CD 4 count/HIV RNA   B-cell lymphoma -Follow-up with Dr. Delton Coombes   Polysubstance abuse -Last used cocaine 1 week prior to this admission -Continues to smoke tobacco -Cessation discussed   Uncontrolled hypertension -Restart carvedilol and hydralazine   Septal hypertrophy -There has been concerns for possible HOCM -Avoid hypotension -Prefer beta-blockade       Family Communication:   Family at bedside  Consultants:  none  Code Status:  FULL   DVT Prophylaxis:  [place] Lovenox   Procedures: As Listed in Progress Note Above  Antibiotics: Ceftriaxone 10/22>> Azithro 10/22>>     Subjective: Patient states that he remains short of breath with mild exertion.  He has a nonproductive cough.  He denies any nausea, vomiting, diarrhea, abdominal pain, chest pain.  Objective: Vitals:   08/31/22 0600 08/31/22 0700 08/31/22 0741 08/31/22 0759  BP: (!) 156/102 (!) 140/96 (!) 174/118 (!) 175/121  Pulse:   90 87  Resp:   (!) 26 20  Temp:   98.4 F (36.9 C) 98.5 F (36.9 C)  TempSrc:   Oral Oral  SpO2:   97% 97%  Weight:      Height:       No intake or output data in the 24 hours ending 08/31/22 1125  Weight change:  Exam:  General:  Pt is alert, follows commands appropriately, not in acute distress HEENT: No icterus, No thrush, No neck mass, [place]/AT Cardiovascular: RRR, S1/S2, no rubs, no gallops Respiratory: Bibasilar rales, left greater than right.  Bibasilar wheeze.   Good air movement. Abdomen: Soft/+BS, non tender, non distended, no guarding Extremities: No edema, No lymphangitis, No petechiae, No rashes, no synovitis   Data Reviewed: I have personally reviewed following labs and imaging studies Basic Metabolic Panel: Recent Labs  Lab 08/30/22 0546 08/31/22 0420  NA 139 138  K 3.5 3.0*  CL 98 97*  CO2 31 30  GLUCOSE 105* 115*  BUN 25* 33*  CREATININE 1.43* 1.45*  CALCIUM 8.8* 8.6*   Liver Function Tests: Recent Labs  Lab 08/30/22 0546  AST 22  ALT 30  ALKPHOS 106  BILITOT 0.7  PROT 7.8  ALBUMIN 3.2*   No results for input(s): "LIPASE", "AMYLASE" in the last 168 hours. No results for input(s): "AMMONIA" in the last 168 hours. Coagulation Profile: Recent Labs  Lab 08/30/22 0546  INR 1.2   CBC: Recent Labs  Lab 08/30/22 0546 08/31/22 0420  WBC 12.0* 7.8  NEUTROABS 9.4*  --   HGB 12.0* 10.5*  HCT 37.3* 32.9*  MCV 89.7 89.6  PLT 321 269   Cardiac Enzymes: No results for input(s): "CKTOTAL", "CKMB", "CKMBINDEX", "TROPONINI" in the last 168 hours. BNP: Invalid input(s): "POCBNP" CBG: No results for input(s): "GLUCAP" in the last 168 hours. HbA1C: No results for input(s): "HGBA1C" in the last 72 hours. Urine analysis:    Component Value Date/Time   COLORURINE YELLOW 08/30/2022 0625   APPEARANCEUR CLEAR 08/30/2022 0625   LABSPEC 1.016 08/30/2022 0625   PHURINE 5.0 08/30/2022 0625   GLUCOSEU NEGATIVE 08/30/2022 0625   HGBUR SMALL (A) 08/30/2022 0625   BILIRUBINUR NEGATIVE 08/30/2022 0625   KETONESUR NEGATIVE 08/30/2022 0625   PROTEINUR 100 (A) 08/30/2022 0625   NITRITE NEGATIVE 08/30/2022 0625   LEUKOCYTESUR NEGATIVE 08/30/2022 0625   Sepsis Labs: '@LABRCNTIP'$ (procalcitonin:4,lacticidven:4) ) Recent Results (from the past 240 hour(s))  Resp Panel by RT-PCR (Flu A&B, Covid) Anterior Nasal Swab     Status: None   Collection Time: 08/30/22  5:12 AM   Specimen: Anterior Nasal Swab  Result Value Ref Range Status    SARS Coronavirus 2 by RT PCR NEGATIVE NEGATIVE Final    Comment: (NOTE) SARS-CoV-2 target nucleic acids are NOT DETECTED.  The SARS-CoV-2 RNA is generally detectable in upper respiratory specimens during the acute phase of infection. The lowest concentration of SARS-CoV-2 viral copies this assay can detect is 138 copies/mL. A negative result does not preclude SARS-Cov-2 infection and should not be used as the sole basis for treatment or other patient management decisions. A negative result may occur with  improper specimen collection/handling, submission of specimen other than nasopharyngeal swab, presence of viral mutation(s) within the areas targeted by this assay, and inadequate number of viral copies(<138 copies/mL). A negative result must be combined with clinical observations, patient history, and epidemiological information. The expected result is Negative.  Fact Sheet for Patients:  EntrepreneurPulse.com.au  Fact Sheet for Healthcare Providers:  IncredibleEmployment.be  This test is no t yet approved or cleared by the [place] FDA and  has been authorized for detection and/or diagnosis of SARS-CoV-2 by FDA under an Emergency Use Authorization (EUA). This EUA will remain  in effect (meaning this test can be used) for the duration of the COVID-19 declaration under Section 564(b)(1) of the Act,  21 U.S.C.section 360bbb-3(b)(1), unless the authorization is terminated  or revoked sooner.       Influenza A by PCR NEGATIVE NEGATIVE Final   Influenza B by PCR NEGATIVE NEGATIVE Final    Comment: (NOTE) The Xpert Xpress SARS-CoV-2/FLU/RSV plus assay is intended as an aid in the diagnosis of influenza from Nasopharyngeal swab specimens and should not be used as a sole basis for treatment. Nasal washings and aspirates are unacceptable for Xpert Xpress SARS-CoV-2/FLU/RSV testing.  Fact Sheet for  Patients: EntrepreneurPulse.com.au  Fact Sheet for Healthcare Providers: IncredibleEmployment.be  This test is not yet approved or cleared by the [place] FDA and has been authorized for detection and/or diagnosis of SARS-CoV-2 by FDA under an Emergency Use Authorization (EUA). This EUA will remain in effect (meaning this test can be used) for the duration of the COVID-19 declaration under Section 564(b)(1) of the Act, 21 U.S.C. section 360bbb-3(b)(1), unless the authorization is terminated or revoked.  Performed at [facility], [address], [place], [place] [address]   Blood Culture (routine x 2)     Status: None (Preliminary result)   Collection Time: 08/30/22  5:38 AM   Specimen: BLOOD RIGHT FOREARM  Result Value Ref Range Status   Specimen Description   Final    BLOOD RIGHT FOREARM BOTTLES DRAWN AEROBIC AND ANAEROBIC   Special Requests Blood Culture adequate volume  Final   Culture   Final    NO GROWTH < 24 HOURS Performed at [facility], [address] [place], [place] [address]    Report Status PENDING  Incomplete  Blood Culture (routine x 2)     Status: None (Preliminary result)   Collection Time: 08/30/22  5:42 AM   Specimen: Right Antecubital; Blood  Result Value Ref Range Status   Specimen Description RIGHT ANTECUBITAL BOTTLES DRAWN AEROBIC ONLY  Final   Special Requests Blood Culture adequate volume  Final   Culture   Final    NO GROWTH < 24 HOURS Performed at [facility], [address] [place], [place] [address]    Report Status PENDING  Incomplete  Respiratory (~20 pathogens) panel by PCR     Status: Abnormal   Collection Time: 08/30/22  9:24 AM   Specimen: Nasopharyngeal Swab; Respiratory  Result Value Ref Range Status   Adenovirus NOT DETECTED NOT DETECTED Final   Coronavirus 229E NOT DETECTED NOT DETECTED Final    Comment: (NOTE) The Coronavirus on the Respiratory Panel, DOES NOT test for the novel   Coronavirus (2019 nCoV)    Coronavirus HKU1 NOT DETECTED NOT DETECTED Final   Coronavirus NL63 NOT DETECTED NOT DETECTED Final   Coronavirus OC43 NOT DETECTED NOT DETECTED Final   Metapneumovirus NOT DETECTED NOT DETECTED Final   Rhinovirus / Enterovirus DETECTED (A) NOT DETECTED Final   Influenza A NOT DETECTED NOT DETECTED Final   Influenza B NOT DETECTED NOT DETECTED Final   Parainfluenza Virus 1 NOT DETECTED NOT DETECTED Final   Parainfluenza Virus 2 NOT DETECTED NOT DETECTED Final   Parainfluenza Virus 3 NOT DETECTED NOT DETECTED Final   Parainfluenza Virus 4 NOT DETECTED NOT DETECTED Final   Respiratory Syncytial Virus NOT DETECTED NOT DETECTED Final   Bordetella pertussis NOT DETECTED NOT DETECTED Final   Bordetella Parapertussis NOT DETECTED NOT DETECTED Final   Chlamydophila pneumoniae NOT DETECTED NOT DETECTED Final   Mycoplasma pneumoniae NOT DETECTED NOT DETECTED Final    Comment: Performed at [facility], [place] [address], [place],  [address]  Scheduled Meds:  aspirin  81 mg Oral Daily   azithromycin  500 mg Oral Daily   bictegravir-emtricitabine-tenofovir AF  1 tablet Oral Daily   budesonide (PULMICORT) nebulizer solution  0.5 mg Nebulization BID   carvedilol  12.5 mg Oral BID WC   Chlorhexidine Gluconate Cloth  6 each Topical Daily   enoxaparin (LOVENOX) injection  40 mg Subcutaneous Q24H   furosemide  40 mg Intravenous Daily   hydrALAZINE  50 mg Oral TID   ipratropium-albuterol  3 mL Nebulization Q6H   Living Better with Heart Failure Book   Does not apply Once   potassium chloride  40 mEq Oral Once   sodium chloride flush  3 mL Intravenous Q12H   Continuous Infusions:  sodium chloride     cefTRIAXone (ROCEPHIN)  IV Stopped (08/30/22 2256)    Procedures/Studies: CT CHEST WO CONTRAST  Result Date: 08/30/2022 CLINICAL DATA:  41 year old male with history of acute respiratory illness. EXAM: CT CHEST WITHOUT CONTRAST TECHNIQUE: Multidetector  CT imaging of the chest was performed following the standard protocol without IV contrast. RADIATION DOSE REDUCTION: This exam was performed according to the departmental dose-optimization program which includes automated exposure control, adjustment of the mA and/or kV according to patient size and/or use of iterative reconstruction technique. COMPARISON:  Chest CT 05/31/2022. FINDINGS: Cardiovascular: Heart size is mildly enlarged. There is no significant pericardial fluid, thickening or pericardial calcification. No atherosclerotic calcifications are noted in the thoracic aorta or the coronary arteries. Right internal jugular single-lumen Port-A-Cath with tip terminating in the distal superior vena cava. Dilatation of the pulmonic trunk (3.9 cm in diameter), concerning for pulmonary arterial hypertension. Mediastinum/Nodes: Small amount of amorphous soft tissue in the anterior mediastinum, favored to represent residual thymic tissue, stable compared to the prior examinations. Multiple prominent nonenlarged mediastinal and bilateral hilar lymph nodes, nonspecific, but statistically likely benign. Esophagus is unremarkable in appearance. No axillary lymphadenopathy. Lungs/Pleura: Diffuse bronchial wall thickening with some patchy areas of thickening of the peribronchovascular interstitium, many of which demonstrate associated ground-glass attenuation and surrounding peribronchovascular micro and macronodularity, including nodular regions of airspace consolidation, most evident in the left lower lobe. No pleural effusions. Upper Abdomen: Unremarkable. Musculoskeletal: There are no aggressive appearing lytic or blastic lesions noted in the visualized portions of the skeleton. IMPRESSION: 1. The appearance the chest is most compatible with severe multilobar bilateral bronchopneumonia, most evident in the left lower lobe. 2. Cardiomegaly. 3. Dilatation of the pulmonic trunk (3.9 cm in diameter), concerning for  pulmonary arterial hypertension. Electronically Signed   By: Vinnie Langton M.D.   On: 08/30/2022 10:01   DG Chest Port 1 View  Result Date: 08/30/2022 CLINICAL DATA:  Shortness of breath and possible sepsis. EXAM: PORTABLE CHEST 1 VIEW COMPARISON:  Portable chest 08/13/2022 FINDINGS: Right IJ port catheter tip remains in the SVC. The heart is moderately enlarged. Central vascular prominence is again noted and mild interstitial edema in the central lungs and lower lung zones with trace pleural effusions beginning to form. Findings consistent with CHF or fluid overload. No focal airspace infiltrate is seen. The mediastinum is normally outlined. Thoracic cage intact. Multiple overlying monitor wires. IMPRESSION: Cardiomegaly perihilar vascular congestion and mild interstitial edema. No focal airspace infiltrate. Trace pleural effusions. Findings consistent with CHF or fluid overload, less likely interstitial pneumonitis. Progress chest films recommended depending on clinical response. Compare: Similar findings on the prior study but the interstitial edema today is less pronounced. Electronically Signed   By: Ninfa Linden.D.  On: 08/30/2022 05:55   DG Chest Port 1 View  Result Date: 08/13/2022 CLINICAL DATA:  Provided history: Cough. Shortness of breath and weakness. EXAM: PORTABLE CHEST 1 VIEW COMPARISON:  [place] 08/12/2022 and earlier.  Chest CT 05/31/2022. FINDINGS: Right chest infusion port catheter with tip projecting at the level of the mid/lower SVC. Cardiomegaly, unchanged. Prominence of the interstitial lung markings, bilaterally. No appreciable airspace consolidation. No evidence of pleural effusion or pneumothorax. No acute bony abnormality identified. IMPRESSION: Cardiomegaly. Prominence of the interstitial lung markings bilaterally, likely reflecting interstitial edema. Electronically Signed   By: Kellie Simmering D.O.   On: 08/13/2022 10:43   DG Chest 2 View  Result Date:  08/12/2022 CLINICAL DATA:  Shortness of breath for 2 days EXAM: CHEST - 2 VIEW COMPARISON:  August 01, 2022 FINDINGS: Right-sided port in place with the tip in the upper SVC. Cardiomegaly, unchanged. No pleural effusion. No pneumothorax. No focal airspace opacity. No displaced rib fractures. Vertebral body heights are maintained. Likely mild pulmonary edema. IMPRESSION: Cardiomegaly with likely mild pulmonary edema, similar to prior. Electronically Signed   By: Marin Roberts M.D.   On: 08/12/2022 15:59    Orson Eva, DO  [facility]  If 7PM-7AM, please contact night-coverage [email or web address] Password [email or web address] 08/31/2022, 11:25 AM   LOS: 1 day

## 2022-08-31 NOTE — ED Notes (Signed)
Per Dr. Carles Collet, patient is to take regular scheduled meds for HTN this am. States that admin. Hydralazine early.

## 2022-09-01 DIAGNOSIS — J9601 Acute respiratory failure with hypoxia: Secondary | ICD-10-CM | POA: Diagnosis not present

## 2022-09-01 DIAGNOSIS — I5043 Acute on chronic combined systolic (congestive) and diastolic (congestive) heart failure: Secondary | ICD-10-CM | POA: Diagnosis not present

## 2022-09-01 DIAGNOSIS — A419 Sepsis, unspecified organism: Secondary | ICD-10-CM | POA: Diagnosis not present

## 2022-09-01 DIAGNOSIS — F1910 Other psychoactive substance abuse, uncomplicated: Secondary | ICD-10-CM

## 2022-09-01 DIAGNOSIS — R7989 Other specified abnormal findings of blood chemistry: Secondary | ICD-10-CM | POA: Diagnosis not present

## 2022-09-01 LAB — URINE CULTURE

## 2022-09-01 LAB — T-HELPER CELLS (CD4) COUNT (NOT AT ARMC)
CD4 % Helper T Cell: 35 % (ref 33–65)
CD4 T Cell Abs: 303 /uL — ABNORMAL LOW (ref 400–1790)

## 2022-09-01 LAB — BASIC METABOLIC PANEL
Anion gap: 9 (ref 5–15)
BUN: 39 mg/dL — ABNORMAL HIGH (ref 6–20)
CO2: 29 mmol/L (ref 22–32)
Calcium: 8.7 mg/dL — ABNORMAL LOW (ref 8.9–10.3)
Chloride: 102 mmol/L (ref 98–111)
Creatinine, Ser: 1.66 mg/dL — ABNORMAL HIGH (ref 0.61–1.24)
GFR, Estimated: 53 mL/min — ABNORMAL LOW (ref >60)
Glucose, Bld: 111 mg/dL — ABNORMAL HIGH (ref 70–99)
Potassium: 3.5 mmol/L (ref 3.5–5.1)
Sodium: 140 mmol/L (ref 135–145)

## 2022-09-01 LAB — CBC
HCT: 31.9 % — ABNORMAL LOW (ref 39.0–52.0)
Hemoglobin: 10.2 g/dL — ABNORMAL LOW (ref 13.0–17.0)
MCH: 29.1 pg (ref 26.0–34.0)
MCHC: 32 g/dL (ref 30.0–36.0)
MCV: 90.9 fL (ref 80.0–100.0)
Platelets: 269 10*3/uL (ref 150–400)
RBC: 3.51 MIL/uL — ABNORMAL LOW (ref 4.22–5.81)
RDW: 17.2 % — ABNORMAL HIGH (ref 11.5–15.5)
WBC: 6.4 10*3/uL (ref 4.0–10.5)
nRBC: 0 % (ref 0.0–0.2)

## 2022-09-01 LAB — HIV-1 RNA QUANT-NO REFLEX-BLD
HIV 1 RNA Quant: <20 copies/mL
LOG10 HIV-1 RNA: UNDETERMINED log10copy/mL

## 2022-09-01 LAB — LEGIONELLA PNEUMOPHILA SEROGP 1 UR AG: L. pneumophila Serogp 1 Ur Ag: NEGATIVE

## 2022-09-01 MED ORDER — AZITHROMYCIN 500 MG PO TABS: 500.0000 mg | ORAL_TABLET | Freq: Every day | ORAL | 0 refills | Status: DC

## 2022-09-01 MED ORDER — CEFDINIR 300 MG PO CAPS: 300.0000 mg | ORAL_CAPSULE | Freq: Two times a day (BID) | ORAL | Status: DC

## 2022-09-01 MED ORDER — CEFDINIR 300 MG PO CAPS: 300.0000 mg | ORAL_CAPSULE | Freq: Two times a day (BID) | ORAL | 0 refills | Status: DC

## 2022-09-01 MED ORDER — CARVEDILOL 12.5 MG PO TABS: 25.0000 mg | ORAL_TABLET | Freq: Two times a day (BID) | ORAL | Status: DC

## 2022-09-01 MED ORDER — HEPARIN SOD (PORK) LOCK FLUSH 100 UNIT/ML IV SOLN: 500.0000 [IU] | Freq: Once | INTRAVENOUS | Status: AC

## 2022-09-01 MED ORDER — HEPARIN SOD (PORK) LOCK FLUSH 100 UNIT/ML IV SOLN
INTRAVENOUS | Status: AC
  Administered 2022-09-01: 500 [IU]
  Filled 2022-09-01: qty 5

## 2022-09-01 MED ORDER — CARVEDILOL 25 MG PO TABS: 25.0000 mg | ORAL_TABLET | Freq: Two times a day (BID) | ORAL | 1 refills | Status: DC

## 2022-09-01 NOTE — Progress Notes (Signed)
Patient's manuel blood pressure is 168/102 on right arm, lying down. Dr. Josephine Cables notified via secure chat.

## 2022-09-01 NOTE — Discharge Summary (Addendum)
Physician Discharge Summary   Patient: Richard Cox MRN: [id number] DOB: 1981/07/04  Admit date:     08/30/2022  Discharge date: 09/01/22  Discharge Physician: Richard Cox   PCP: Pcp, No   Recommendations at discharge:   Please follow up with primary care provider within 1-2 weeks  Please repeat BMP and CBC in one week  Please follow up on/with      Hospital Course: 41 year old male with a history of septal hypertrophy, possible HOCM, HIV, systolic and diastolic CHF, hypertension, polysubstance abuse presenting with 1 week history of shortness of breath and coughing that significantly worsened in the last 24 hours prior to admission.  He had some subjective fevers and chills at home.  His cough is essentially nonproductive.  He denies any nausea, vomiting, diarrhea, abdominal pain, dysuria, hematuria, headache.  The patient endorses compliance with all his medications.  He last used cocaine about 1 week prior to this admission.  He continues to smoke 1/2 pack/day.  He denies any alcohol.  Notably, the patient was recently admitted to the hospital on 08/13/2022 for acute on chronic combined CHF.  He left AGAINST MEDICAL ADVICE.  In addition, the patient also had an ED visit on 08/01/2022.  He had decompensated CHF at that time.  He left AMA after receiving a dose of intravenous furosemide. In the ED, the patient was febrile up to 100.9 F.  He was tachycardic with heart rate 100-1 10.  He was hemodynamically stable with blood pressure up to 166/107.  Oxygen saturation was 89% on room air.  The patient was started on ceftriaxone and azithromycin.  Assessment and Plan: Sepsis -Present on admission -Presented with tachycardia, fever, leukocytosis -Secondary to pneumonia -Lactic acid 0.7>> 0.9 -Limiting IV fluids secondary to CHF -sepsis physiology resolved   Lobar Pneumonia -Urine Legionella antigen -Continue ceftriaxone and azithromycin -Check PCT 0.64 -COVID-19 PCR negative -Viral  respiratory panel-->+rhinovirus/enterovirus -10/22 CT chest--Diffuse bronchial wall thickening with some patchy areas of thickening of the peribronchovascular interstitium, many of which demonstrate associated ground-glass attenuation and surrounding peribronchovascular micro and macronodularity -Added bronchodilators -d/c home with 4 more days azithromycin, 5 days cefdinir -no desaturation with ambulation on RA on day of d/c  Acute respiratory failure with hypoxia -presented with hypoxia 89% and tachypnea -stable on 2L -weaned to RA -no desaturation with ambulation on RA on day of d/c   Elevated troponin -Secondary to demand ischemia -No chest pain presently -10/23 echo EF 30-35%, no LVOT, global HK -I made referral for patient to follow up with cardiology after d/c   Acute on chronic combined CHF -08/24/2021 echo--severe asymmetric septal hypertrophy, no LVOT, EF 45-50%, global HK, grade 2 DD -Start IV furosemide>>d/c home with lasix 40 mg po daily -instructed pt to hold off on starting po lasix until 10/26  -Daily weights -Accurate I's and O's   HIV -10/17/2021 HIV RNA undetectable -10/17/2021 CD4 count 271 -Continue Biktarvy>>pt endorses compliance except for 4 days 2 weeks ago when there was a delay getting the meds -recheck CD 4 count/HIV RNA--pending at time of d/c -pt states he has ID follow up already this week   B-cell lymphoma -Follow-up with Dr. Delton Cox   Polysubstance abuse -Last used cocaine 1 week prior to this admission -Continues to smoke tobacco -Cessation discussed   Uncontrolled hypertension -Restart carvedilol and hydralazine   Septal hypertrophy -There has been concerns for possible HOCM -Avoid hypotension -Prefer beta-blockade -I have made referral for pt to follow up with as outpt with cardiology  Consultants: none Procedures performed: none  Disposition: Home Diet recommendation:  Cardiac diet DISCHARGE  MEDICATION: Allergies as of 09/01/2022       Reactions   Neosporin [neomycin-bacitracin Zn-polymyx] Rash        Medication List     TAKE these medications    acetaminophen 500 MG tablet Commonly known as: TYLENOL Take 1,000 mg by mouth every 6 (six) hours as needed for moderate pain or headache.   aspirin 81 MG chewable tablet Chew 1 tablet (81 mg total) by mouth daily.   azithromycin 500 MG tablet Commonly known as: ZITHROMAX Take 1 tablet (500 mg total) by mouth daily.   Biktarvy 50-200-25 MG Tabs tablet Generic drug: bictegravir-emtricitabine-tenofovir AF TAKE 1 TABLET BY MOUTH DAILY   carvedilol 25 MG tablet Commonly known as: COREG Take 1 tablet (25 mg total) by mouth 2 (two) times daily with a meal. What changed:  medication strength how much to take   cefdinir 300 MG capsule Commonly known as: OMNICEF Take 1 capsule (300 mg total) by mouth every 12 (twelve) hours.   furosemide 40 MG tablet Commonly known as: LASIX Take 1 tablet (40 mg total) by mouth daily as needed for fluid.   hydrALAZINE 50 MG tablet Commonly known as: APRESOLINE Take 1 tablet (50 mg total) by mouth 3 (three) times daily.   losartan 50 MG tablet Commonly known as: COZAAR Take 1 tablet (50 mg total) by mouth daily.        Discharge Exam: Filed Weights   08/30/22 0510 09/01/22 0513  Weight: 74.8 kg 74.2 kg   HEENT:  [place]/AT, No thrush, no icterus CV:  RRR, no rub, no S3, no S4 Lung:  bibasilar rales. No wheeze Abd:  soft/+BS, NT Ext:  No edema, no lymphangitis, no synovitis, no rash   Condition at discharge: stable  The results of significant diagnostics from this hospitalization (including imaging, microbiology, ancillary and laboratory) are listed below for reference.   Imaging Studies: ECHOCARDIOGRAM COMPLETE  Result Date: 08/31/2022    ECHOCARDIOGRAM REPORT   Patient Name:   Richard Cox Date of Exam: 08/31/2022 Medical Rec #:  [id number]  Height:       73.0 in  Accession #:    [id number] Weight:       164.9 lb Date of Birth:  1981/02/07   BSA:          1.982 m Patient Age:    35 years   BP:           175/121 mmHg Patient Gender: M          HR:           82 bpm. Exam Location:  [facility] Procedure: 2D Echo, Cardiac Doppler and Color Doppler Indications:    CHF  History:        Patient has prior history of Echocardiogram examinations, most                 recent 08/24/2021. CHF; Risk Factors:Hypertension and Current                 Smoker. Substance abuse, ETOH abuse.  Sonographer:    Wenda Low Referring Phys: [phone] Berdene Askari IMPRESSIONS  1. Severe LVH with far more prominent hypertrophy of the septum. There is no dynamic LVOT gradient. 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 . Left ventricular ejection fraction, by estimation, is 30 to 35%. The left ventricle has moderately decreased function. The left ventricle demonstrates global hypokinesis. There  is severe asymmetric left ventricular hypertrophy of the septal segment. Left ventricular diastolic parameters are consistent with Grade III diastolic dysfunction (restrictive).  2. Right ventricular systolic function is normal. The right ventricular size is normal. There is normal pulmonary artery systolic pressure.  3. Left atrial size was severely dilated.  4. Right atrial size was moderately dilated.  5. The mitral valve is normal in structure. No evidence of mitral valve regurgitation. No evidence of mitral stenosis.  6. The aortic valve has an indeterminant number of cusps. Aortic valve regurgitation is not visualized. No aortic stenosis is present.  7. The inferior vena cava is normal in size with greater than 50% respiratory variability, suggesting right atrial pressure of 3 mmHg. FINDINGS  Left Ventricle: Severe LVH with far more prominent hypertrophy of the septum. There is no dynamic LVOT gradient. 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 Left ventricular  ejection fraction, by estimation, is 30 to 35%. The left ventricle has moderately decreased function. The left ventricle demonstrates global hypokinesis. The left ventricular internal cavity size was normal in size. There is severe asymmetric left ventricular hypertrophy of the septal segment. Left ventricular diastolic parameters are consistent with Grade III diastolic dysfunction (restrictive). Right Ventricle: The right ventricular size is normal. Right vetricular wall thickness was not well visualized. Right ventricular systolic function is normal. There is normal pulmonary artery systolic pressure. The tricuspid regurgitant velocity is 1.96 m/s, and with an assumed right atrial pressure of 3 mmHg, the estimated right ventricular systolic pressure is 74.1 mmHg. Left Atrium: Left atrial size was severely dilated. Right Atrium: Right atrial size was moderately dilated. Pericardium: There is no evidence of pericardial effusion. Mitral Valve: The mitral valve is normal in structure. No evidence of mitral valve regurgitation. No evidence of mitral valve stenosis. MV peak gradient, 4.6 mmHg. The mean mitral valve gradient is 1.0 mmHg. Tricuspid Valve: The tricuspid valve is normal in structure. Tricuspid valve regurgitation is not demonstrated. No evidence of tricuspid stenosis. Aortic Valve: The aortic valve has an indeterminant number of cusps. Aortic valve regurgitation is not visualized. No aortic stenosis is present. Aortic valve mean gradient measures 3.0 mmHg. Aortic valve peak gradient measures 5.1 mmHg. Aortic valve area, by VTI measures 4.16 cm. Pulmonic Valve: The pulmonic valve was not well visualized. Pulmonic valve regurgitation is mild. No evidence of pulmonic stenosis. Aorta: The aortic root is normal in size and structure. Venous: The inferior vena cava is normal in size with greater than 50% respiratory variability, suggesting right atrial pressure of 3 mmHg. IAS/Shunts: No atrial level shunt  detected by color flow Doppler.  LEFT VENTRICLE PLAX 2D LVIDd:         5.50 cm   Diastology LVIDs:         3.50 cm   LV e' lateral:   8.33 cm/s LV PW:         1.70 cm   LV E/e' lateral: 11.4 LV IVS:        3.40 cm LVOT diam:     2.50 cm LV SV:         74 LV SV Index:   37 LVOT Area:     4.91 cm  RIGHT VENTRICLE RV Basal diam:  3.80 cm RV Mid diam:    3.40 cm RV S prime:     10.10 cm/s TAPSE (M-mode): 2.3 cm LEFT ATRIUM            Index        RIGHT ATRIUM  Index LA diam:      5.70 cm  2.88 cm/m   RA Area:     20.20 cm LA Vol (A2C): 89.5 ml  45.15 ml/m  RA Volume:   61.10 ml  30.82 ml/m LA Vol (A4C): 145.0 ml 73.15 ml/m  AORTIC VALVE                    PULMONIC VALVE AV Area (Vmax):    4.47 cm     PV Vmax:       1.02 m/s AV Area (Vmean):   4.34 cm     PV Peak grad:  4.2 mmHg AV Area (VTI):     4.16 cm AV Vmax:           113.00 cm/s AV Vmean:          77.100 cm/s AV VTI:            0.178 m AV Peak Grad:      5.1 mmHg AV Mean Grad:      3.0 mmHg LVOT Vmax:         103.00 cm/s LVOT Vmean:        68.100 cm/s LVOT VTI:          0.151 m LVOT/AV VTI ratio: 0.85  AORTA Ao Root diam: 3.50 cm MITRAL VALVE               TRICUSPID VALVE MV Area (PHT): 5.62 cm    TR Peak grad:   15.4 mmHg MV Area VTI:   3.76 cm    TR Vmax:        196.00 cm/s MV Peak grad:  4.6 mmHg MV Mean grad:  1.0 mmHg    SHUNTS MV Vmax:       1.07 m/s    Systemic VTI:  0.15 m MV Vmean:      50.1 cm/s   Systemic Diam: 2.50 cm MV Decel Time: 135 msec MV E velocity: 95.10 cm/s MV A velocity: 44.10 cm/s MV E/A ratio:  2.16 Carlyle Dolly MD Electronically signed by Carlyle Dolly MD Signature Date/Time: 08/31/2022/3:17:18 PM    Final    CT CHEST WO CONTRAST  Result Date: 08/30/2022 CLINICAL DATA:  41 year old male with history of acute respiratory illness. EXAM: CT CHEST WITHOUT CONTRAST TECHNIQUE: Multidetector CT imaging of the chest was performed following the standard protocol without IV contrast. RADIATION DOSE REDUCTION: This exam  was performed according to the departmental dose-optimization program which includes automated exposure control, adjustment of the mA and/or kV according to patient size and/or use of iterative reconstruction technique. COMPARISON:  Chest CT 05/31/2022. FINDINGS: Cardiovascular: Heart size is mildly enlarged. There is no significant pericardial fluid, thickening or pericardial calcification. No atherosclerotic calcifications are noted in the thoracic aorta or the coronary arteries. Right internal jugular single-lumen Port-A-Cath with tip terminating in the distal superior vena cava. Dilatation of the pulmonic trunk (3.9 cm in diameter), concerning for pulmonary arterial hypertension. Mediastinum/Nodes: Small amount of amorphous soft tissue in the anterior mediastinum, favored to represent residual thymic tissue, stable compared to the prior examinations. Multiple prominent nonenlarged mediastinal and bilateral hilar lymph nodes, nonspecific, but statistically likely benign. Esophagus is unremarkable in appearance. No axillary lymphadenopathy. Lungs/Pleura: Diffuse bronchial wall thickening with some patchy areas of thickening of the peribronchovascular interstitium, many of which demonstrate associated ground-glass attenuation and surrounding peribronchovascular micro and macronodularity, including nodular regions of airspace consolidation, most evident in the left lower lobe. No pleural effusions. Upper  Abdomen: Unremarkable. Musculoskeletal: There are no aggressive appearing lytic or blastic lesions noted in the visualized portions of the skeleton. IMPRESSION: 1. The appearance the chest is most compatible with severe multilobar bilateral bronchopneumonia, most evident in the left lower lobe. 2. Cardiomegaly. 3. Dilatation of the pulmonic trunk (3.9 cm in diameter), concerning for pulmonary arterial hypertension. Electronically Signed   By: Vinnie Langton M.D.   On: 08/30/2022 10:01   DG Chest Port 1  View  Result Date: 08/30/2022 CLINICAL DATA:  Shortness of breath and possible sepsis. EXAM: PORTABLE CHEST 1 VIEW COMPARISON:  Portable chest 08/13/2022 FINDINGS: Right IJ port catheter tip remains in the SVC. The heart is moderately enlarged. Central vascular prominence is again noted and mild interstitial edema in the central lungs and lower lung zones with trace pleural effusions beginning to form. Findings consistent with CHF or fluid overload. No focal airspace infiltrate is seen. The mediastinum is normally outlined. Thoracic cage intact. Multiple overlying monitor wires. IMPRESSION: Cardiomegaly perihilar vascular congestion and mild interstitial edema. No focal airspace infiltrate. Trace pleural effusions. Findings consistent with CHF or fluid overload, less likely interstitial pneumonitis. Progress chest films recommended depending on clinical response. Compare: Similar findings on the prior study but the interstitial edema today is less pronounced. Electronically Signed   By: Telford Nab M.D.   On: 08/30/2022 05:55   DG Chest Port 1 View  Result Date: 08/13/2022 CLINICAL DATA:  Provided history: Cough. Shortness of breath and weakness. EXAM: PORTABLE CHEST 1 VIEW COMPARISON:  [place] 08/12/2022 and earlier.  Chest CT 05/31/2022. FINDINGS: Right chest infusion port catheter with tip projecting at the level of the mid/lower SVC. Cardiomegaly, unchanged. Prominence of the interstitial lung markings, bilaterally. No appreciable airspace consolidation. No evidence of pleural effusion or pneumothorax. No acute bony abnormality identified. IMPRESSION: Cardiomegaly. Prominence of the interstitial lung markings bilaterally, likely reflecting interstitial edema. Electronically Signed   By: Kellie Simmering D.O.   On: 08/13/2022 10:43   DG Chest 2 View  Result Date: 08/12/2022 CLINICAL DATA:  Shortness of breath for 2 days EXAM: CHEST - 2 VIEW COMPARISON:  August 01, 2022 FINDINGS: Right-sided port in  place with the tip in the upper SVC. Cardiomegaly, unchanged. No pleural effusion. No pneumothorax. No focal airspace opacity. No displaced rib fractures. Vertebral body heights are maintained. Likely mild pulmonary edema. IMPRESSION: Cardiomegaly with likely mild pulmonary edema, similar to prior. Electronically Signed   By: Marin Roberts M.D.   On: 08/12/2022 15:59    Microbiology: Results for orders placed or performed during the hospital encounter of 08/30/22  Urine Culture     Status: Abnormal   Collection Time: 08/30/22  5:11 AM   Specimen: In/Out Cath Urine  Result Value Ref Range Status   Specimen Description   Final    IN/OUT CATH URINE Performed at [facility], [address], [place], [place] [address]    Special Requests   Final    NONE Performed at [facility], [address], [place], [place] [address]    Culture MULTIPLE SPECIES PRESENT, SUGGEST RECOLLECTION (A)  Final   Report Status 09/01/2022 FINAL  Final  Resp Panel by RT-PCR (Flu A&B, Covid) Anterior Nasal Swab     Status: None   Collection Time: 08/30/22  5:12 AM   Specimen: Anterior Nasal Swab  Result Value Ref Range Status   SARS Coronavirus 2 by RT PCR NEGATIVE NEGATIVE Final    Comment: (NOTE) SARS-CoV-2 target nucleic acids are NOT DETECTED.  The SARS-CoV-2 RNA is generally detectable in upper respiratory specimens during the acute phase of infection. The lowest concentration of SARS-CoV-2 viral copies this assay can detect is 138 copies/mL. A negative result does not preclude SARS-Cov-2 infection and should not be used as the sole basis for treatment or other patient management decisions. A negative result may occur with  improper specimen collection/handling, submission of specimen other than nasopharyngeal swab, presence of viral mutation(s) within the areas targeted by this assay, and inadequate number of viral copies(<138 copies/mL). A negative result must be combined with clinical observations,  patient history, and epidemiological information. The expected result is Negative.  Fact Sheet for Patients:  EntrepreneurPulse.com.au  Fact Sheet for Healthcare Providers:  IncredibleEmployment.be  This test is no t yet approved or cleared by the [place] FDA and  has been authorized for detection and/or diagnosis of SARS-CoV-2 by FDA under an Emergency Use Authorization (EUA). This EUA will remain  in effect (meaning this test can be used) for the duration of the COVID-19 declaration under Section 564(b)(1) of the Act, 21 U.S.C.section 360bbb-3(b)(1), unless the authorization is terminated  or revoked sooner.       Influenza A by PCR NEGATIVE NEGATIVE Final   Influenza B by PCR NEGATIVE NEGATIVE Final    Comment: (NOTE) The Xpert Xpress SARS-CoV-2/FLU/RSV plus assay is intended as an aid in the diagnosis of influenza from Nasopharyngeal swab specimens and should not be used as a sole basis for treatment. Nasal washings and aspirates are unacceptable for Xpert Xpress SARS-CoV-2/FLU/RSV testing.  Fact Sheet for Patients: EntrepreneurPulse.com.au  Fact Sheet for Healthcare Providers: IncredibleEmployment.be  This test is not yet approved or cleared by the [place] FDA and has been authorized for detection and/or diagnosis of SARS-CoV-2 by FDA under an Emergency Use Authorization (EUA). This EUA will remain in effect (meaning this test can be used) for the duration of the COVID-19 declaration under Section 564(b)(1) of the Act, 21 U.S.C. section 360bbb-3(b)(1), unless the authorization is terminated or revoked.  Performed at [facility], [address] [place], [place] [address]   Blood Culture (routine x 2)     Status: None (Preliminary result)   Collection Time: 08/30/22  5:38 AM   Specimen: BLOOD RIGHT FOREARM  Result Value Ref Range Status   Specimen Description   Final    BLOOD RIGHT  FOREARM BOTTLES DRAWN AEROBIC AND ANAEROBIC   Special Requests Blood Culture adequate volume  Final   Culture   Final    NO GROWTH 2 DAYS Performed at [facility], [address], [place], [place] [address]    Report Status PENDING  Incomplete  Blood Culture (routine x 2)     Status: None (Preliminary result)   Collection Time: 08/30/22  5:42 AM   Specimen: Right Antecubital; Blood  Result Value Ref Range Status   Specimen Description RIGHT ANTECUBITAL BOTTLES DRAWN AEROBIC ONLY  Final   Special Requests Blood Culture adequate volume  Final   Culture   Final    NO GROWTH 2 DAYS Performed at [facility], [address] [place], [place] [address]    Report Status PENDING  Incomplete  Respiratory (~20 pathogens) panel by PCR     Status: Abnormal   Collection Time: 08/30/22  9:24 AM   Specimen: Nasopharyngeal Swab; Respiratory  Result Value Ref Range Status   Adenovirus NOT DETECTED NOT DETECTED Final   Coronavirus 229E NOT DETECTED NOT DETECTED Final    Comment: (NOTE) The Coronavirus on  the Respiratory Panel, DOES NOT test for the novel  Coronavirus (2019 nCoV)    Coronavirus HKU1 NOT DETECTED NOT DETECTED Final   Coronavirus NL63 NOT DETECTED NOT DETECTED Final   Coronavirus OC43 NOT DETECTED NOT DETECTED Final   Metapneumovirus NOT DETECTED NOT DETECTED Final   Rhinovirus / Enterovirus DETECTED (A) NOT DETECTED Final   Influenza A NOT DETECTED NOT DETECTED Final   Influenza B NOT DETECTED NOT DETECTED Final   Parainfluenza Virus 1 NOT DETECTED NOT DETECTED Final   Parainfluenza Virus 2 NOT DETECTED NOT DETECTED Final   Parainfluenza Virus 3 NOT DETECTED NOT DETECTED Final   Parainfluenza Virus 4 NOT DETECTED NOT DETECTED Final   Respiratory Syncytial Virus NOT DETECTED NOT DETECTED Final   Bordetella pertussis NOT DETECTED NOT DETECTED Final   Bordetella Parapertussis NOT DETECTED NOT DETECTED Final   Chlamydophila pneumoniae NOT DETECTED NOT DETECTED Final   Mycoplasma  pneumoniae NOT DETECTED NOT DETECTED Final    Comment: Performed at [facility], [place] [address] [place], [place] [address]    Labs: CBC: Recent Labs  Lab 08/30/22 0546 08/31/22 0420 09/01/22 0503  WBC 12.0* 7.8 6.4  NEUTROABS 9.4*  --   --   HGB 12.0* 10.5* 10.2*  HCT 37.3* 32.9* 31.9*  MCV 89.7 89.6 90.9  PLT 321 269 887   Basic Metabolic Panel: Recent Labs  Lab 08/30/22 0546 08/31/22 0420 09/01/22 0503  NA 139 138 140  K 3.5 3.0* 3.5  CL 98 97* 102  CO2 '31 30 29  '$ GLUCOSE 105* 115* 111*  BUN 25* 33* 39*  CREATININE 1.43* 1.45* 1.66*  CALCIUM 8.8* 8.6* 8.7*   Liver Function Tests: Recent Labs  Lab 08/30/22 0546  AST 22  ALT 30  ALKPHOS 106  BILITOT 0.7  PROT 7.8  ALBUMIN 3.2*   CBG: No results for input(s): "GLUCAP" in the last 168 hours.  Discharge time spent: greater than 30 minutes.  Signed: Orson Eva, MD [facility] 09/01/2022

## 2022-09-01 NOTE — Progress Notes (Signed)
Ng Discharge Note  Admit Date:  08/30/2022 Discharge date: 09/01/2022   Richard Cox to be D/C'd Home per MD order.  AVS completed. Patient/caregiver able to verbalize understanding.  Discharge Medication: Allergies as of 09/01/2022       Reactions   Neosporin [neomycin-bacitracin Zn-polymyx] Rash        Medication List     TAKE these medications    acetaminophen 500 MG tablet Commonly known as: TYLENOL Take 1,000 mg by mouth every 6 (six) hours as needed for moderate pain or headache.   aspirin 81 MG chewable tablet Chew 1 tablet (81 mg total) by mouth daily.   azithromycin 500 MG tablet Commonly known as: ZITHROMAX Take 1 tablet (500 mg total) by mouth daily.   Biktarvy 50-200-25 MG Tabs tablet Generic drug: bictegravir-emtricitabine-tenofovir AF TAKE 1 TABLET BY MOUTH DAILY   carvedilol 25 MG tablet Commonly known as: COREG Take 1 tablet (25 mg total) by mouth 2 (two) times daily with a meal. What changed:  medication strength how much to take   cefdinir 300 MG capsule Commonly known as: OMNICEF Take 1 capsule (300 mg total) by mouth every 12 (twelve) hours.   furosemide 40 MG tablet Commonly known as: LASIX Take 1 tablet (40 mg total) by mouth daily as needed for fluid.   hydrALAZINE 50 MG tablet Commonly known as: APRESOLINE Take 1 tablet (50 mg total) by mouth 3 (three) times daily.   losartan 50 MG tablet Commonly known as: COZAAR Take 1 tablet (50 mg total) by mouth daily.        Discharge Assessment: Vitals:   09/01/22 0759 09/01/22 0803  BP:    Pulse:    Resp:    Temp:    SpO2: 98% 100%   Skin clean, dry and intact without evidence of skin break down, no evidence of skin tears noted. IV catheter discontinued intact. Site without signs and symptoms of complications - no redness or edema noted at insertion site, patient denies c/o pain - only slight tenderness at site.  Dressing with slight pressure applied.  D/c  Instructions-Education: Discharge instructions given to patient/family with verbalized understanding. D/c education completed with patient/family including follow up instructions, medication list, d/c activities limitations if indicated, with other d/c instructions as indicated by MD - patient able to verbalize understanding, all questions fully answered. Patient instructed to return to ED, call 911, or call MD for any changes in condition.  Patient escorted via [place], and D/C home via private auto.  Tsosie Billing, LPN 09/73/5329 92:42 PM

## 2022-09-01 NOTE — Progress Notes (Signed)
Per MD, patient given his 0800 antihypertensive medication at 0600.

## 2022-09-04 ENCOUNTER — Encounter (HOSPITAL_COMMUNITY): Payer: Self-pay | Admitting: Emergency Medicine

## 2022-09-04 ENCOUNTER — Inpatient Hospital Stay (HOSPITAL_COMMUNITY)
Admission: EM | Admit: 2022-09-04 | Discharge: 2022-09-05 | DRG: 291 | Disposition: A | Discharge status: Home / Self Care | Payer: Medicaid Other | Attending: Internal Medicine | Admitting: Internal Medicine

## 2022-09-04 ENCOUNTER — Emergency Department (HOSPITAL_COMMUNITY): Payer: Medicaid Other

## 2022-09-04 DIAGNOSIS — I509 Heart failure, unspecified: Secondary | ICD-10-CM | POA: Diagnosis present

## 2022-09-04 DIAGNOSIS — Z87891 Personal history of nicotine dependence: Secondary | ICD-10-CM | POA: Diagnosis not present

## 2022-09-04 DIAGNOSIS — Z888 Allergy status to other drugs, medicaments and biological substances status: Secondary | ICD-10-CM

## 2022-09-04 DIAGNOSIS — R778 Other specified abnormalities of plasma proteins: Secondary | ICD-10-CM

## 2022-09-04 DIAGNOSIS — I5043 Acute on chronic combined systolic (congestive) and diastolic (congestive) heart failure: Secondary | ICD-10-CM | POA: Diagnosis present

## 2022-09-04 DIAGNOSIS — I5042 Chronic combined systolic (congestive) and diastolic (congestive) heart failure: Secondary | ICD-10-CM

## 2022-09-04 DIAGNOSIS — I10 Essential (primary) hypertension: Secondary | ICD-10-CM | POA: Diagnosis present

## 2022-09-04 DIAGNOSIS — F1410 Cocaine abuse, uncomplicated: Secondary | ICD-10-CM | POA: Diagnosis present

## 2022-09-04 DIAGNOSIS — I161 Hypertensive emergency: Secondary | ICD-10-CM | POA: Diagnosis present

## 2022-09-04 DIAGNOSIS — Z1152 Encounter for screening for COVID-19: Secondary | ICD-10-CM | POA: Diagnosis not present

## 2022-09-04 DIAGNOSIS — I2489 Other forms of acute ischemic heart disease: Secondary | ICD-10-CM | POA: Diagnosis present

## 2022-09-04 DIAGNOSIS — I422 Other hypertrophic cardiomyopathy: Secondary | ICD-10-CM | POA: Diagnosis present

## 2022-09-04 DIAGNOSIS — I160 Hypertensive urgency: Principal | ICD-10-CM

## 2022-09-04 DIAGNOSIS — Z8249 Family history of ischemic heart disease and other diseases of the circulatory system: Secondary | ICD-10-CM

## 2022-09-04 DIAGNOSIS — I130 Hypertensive heart and chronic kidney disease with heart failure and stage 1 through stage 4 chronic kidney disease, or unspecified chronic kidney disease: Principal | ICD-10-CM | POA: Diagnosis present

## 2022-09-04 DIAGNOSIS — C8510 Unspecified B-cell lymphoma, unspecified site: Secondary | ICD-10-CM | POA: Diagnosis present

## 2022-09-04 DIAGNOSIS — Z91119 Patient's noncompliance with dietary regimen due to unspecified reason: Secondary | ICD-10-CM

## 2022-09-04 DIAGNOSIS — Z7982 Long term (current) use of aspirin: Secondary | ICD-10-CM | POA: Diagnosis not present

## 2022-09-04 DIAGNOSIS — J9601 Acute respiratory failure with hypoxia: Secondary | ICD-10-CM | POA: Diagnosis present

## 2022-09-04 DIAGNOSIS — B20 Human immunodeficiency virus [HIV] disease: Secondary | ICD-10-CM | POA: Diagnosis present

## 2022-09-04 DIAGNOSIS — I169 Hypertensive crisis, unspecified: Secondary | ICD-10-CM | POA: Diagnosis present

## 2022-09-04 DIAGNOSIS — N189 Chronic kidney disease, unspecified: Secondary | ICD-10-CM | POA: Diagnosis present

## 2022-09-04 DIAGNOSIS — Z79899 Other long term (current) drug therapy: Secondary | ICD-10-CM | POA: Diagnosis not present

## 2022-09-04 DIAGNOSIS — Z21 Asymptomatic human immunodeficiency virus [HIV] infection status: Secondary | ICD-10-CM | POA: Diagnosis present

## 2022-09-04 DIAGNOSIS — Z91148 Patient's other noncompliance with medication regimen for other reason: Secondary | ICD-10-CM | POA: Diagnosis not present

## 2022-09-04 DIAGNOSIS — I5021 Acute systolic (congestive) heart failure: Secondary | ICD-10-CM

## 2022-09-04 LAB — CBC WITH DIFFERENTIAL/PLATELET
Abs Immature Granulocytes: 0.05 10*3/uL (ref 0.00–0.07)
Basophils Absolute: 0 10*3/uL (ref 0.0–0.1)
Basophils Relative: 1 %
Eosinophils Absolute: 0.1 10*3/uL (ref 0.0–0.5)
Eosinophils Relative: 1 %
HCT: 32.8 % — ABNORMAL LOW (ref 39.0–52.0)
Hemoglobin: 10.2 g/dL — ABNORMAL LOW (ref 13.0–17.0)
Immature Granulocytes: 1 %
Lymphocytes Relative: 22 %
Lymphs Abs: 1.6 10*3/uL (ref 0.7–4.0)
MCH: 28.5 pg (ref 26.0–34.0)
MCHC: 31.1 g/dL (ref 30.0–36.0)
MCV: 91.6 fL (ref 80.0–100.0)
Monocytes Absolute: 0.5 10*3/uL (ref 0.1–1.0)
Monocytes Relative: 6 %
Neutro Abs: 5.2 10*3/uL (ref 1.7–7.7)
Neutrophils Relative %: 69 %
Platelets: 279 10*3/uL (ref 150–400)
RBC: 3.58 MIL/uL — ABNORMAL LOW (ref 4.22–5.81)
RDW: 17.3 % — ABNORMAL HIGH (ref 11.5–15.5)
WBC: 7.4 10*3/uL (ref 4.0–10.5)
nRBC: 0 % (ref 0.0–0.2)

## 2022-09-04 LAB — CULTURE, BLOOD (ROUTINE X 2)
Culture: NO GROWTH
Culture: NO GROWTH
Special Requests: ADEQUATE
Special Requests: ADEQUATE

## 2022-09-04 LAB — LACTIC ACID, PLASMA: Lactic Acid, Venous: 1 mmol/L (ref 0.5–1.9)

## 2022-09-04 LAB — COMPREHENSIVE METABOLIC PANEL
ALT: 49 U/L — ABNORMAL HIGH (ref 0–44)
AST: 56 U/L — ABNORMAL HIGH (ref 15–41)
Albumin: 2.9 g/dL — ABNORMAL LOW (ref 3.5–5.0)
Alkaline Phosphatase: 84 U/L (ref 38–126)
Anion gap: 9 (ref 5–15)
BUN: 27 mg/dL — ABNORMAL HIGH (ref 6–20)
CO2: 25 mmol/L (ref 22–32)
Calcium: 8.7 mg/dL — ABNORMAL LOW (ref 8.9–10.3)
Chloride: 107 mmol/L (ref 98–111)
Creatinine, Ser: 1.35 mg/dL — ABNORMAL HIGH (ref 0.61–1.24)
GFR, Estimated: >60 mL/min (ref >60)
Glucose, Bld: 98 mg/dL (ref 70–99)
Potassium: 4.6 mmol/L (ref 3.5–5.1)
Sodium: 141 mmol/L (ref 135–145)
Total Bilirubin: 0.2 mg/dL — ABNORMAL LOW (ref 0.3–1.2)
Total Protein: 7.3 g/dL (ref 6.5–8.1)

## 2022-09-04 LAB — RAPID URINE DRUG SCREEN, HOSP PERFORMED
Amphetamines: NOT DETECTED
Barbiturates: NOT DETECTED
Benzodiazepines: NOT DETECTED
Cocaine: POSITIVE — AB
Opiates: NOT DETECTED
Tetrahydrocannabinol: NOT DETECTED

## 2022-09-04 LAB — RESP PANEL BY RT-PCR (FLU A&B, COVID) ARPGX2
Influenza A by PCR: NEGATIVE
Influenza B by PCR: NEGATIVE
SARS Coronavirus 2 by RT PCR: NEGATIVE

## 2022-09-04 LAB — TROPONIN I (HIGH SENSITIVITY)
Troponin I (High Sensitivity): 112 ng/L (ref <18)
Troponin I (High Sensitivity): 106 ng/L (ref <18)

## 2022-09-04 LAB — BRAIN NATRIURETIC PEPTIDE: B Natriuretic Peptide: 3987 pg/mL — ABNORMAL HIGH (ref 0.0–100.0)

## 2022-09-04 MED ORDER — HYDRALAZINE HCL 25 MG PO TABS
50.0000 mg | ORAL_TABLET | Freq: Three times a day (TID) | ORAL | Status: DC
  Administered 2022-09-04 (×2): 50 mg via ORAL
  Filled 2022-09-04 (×3): qty 2

## 2022-09-04 MED ORDER — ALBUTEROL SULFATE (2.5 MG/3ML) 0.083% IN NEBU
2.5000 mg | INHALATION_SOLUTION | Freq: Once | RESPIRATORY_TRACT | Status: AC
  Administered 2022-09-04: 2.5 mg via RESPIRATORY_TRACT
  Filled 2022-09-04: qty 3

## 2022-09-04 MED ORDER — HYDRALAZINE HCL 20 MG/ML IJ SOLN
5.0000 mg | Freq: Once | INTRAMUSCULAR | Status: AC
  Administered 2022-09-04: 5 mg via INTRAVENOUS
  Filled 2022-09-04: qty 1

## 2022-09-04 MED ORDER — IPRATROPIUM-ALBUTEROL 0.5-2.5 (3) MG/3ML IN SOLN
3.0000 mL | Freq: Once | RESPIRATORY_TRACT | Status: AC
  Administered 2022-09-04: 3 mL via RESPIRATORY_TRACT
  Filled 2022-09-04: qty 3

## 2022-09-04 MED ORDER — AZITHROMYCIN 250 MG PO TABS
500.0000 mg | ORAL_TABLET | Freq: Every day | ORAL | Status: DC
  Administered 2022-09-04 – 2022-09-05 (×2): 500 mg via ORAL
  Filled 2022-09-04 (×2): qty 2

## 2022-09-04 MED ORDER — CEFDINIR 300 MG PO CAPS
300.0000 mg | ORAL_CAPSULE | Freq: Two times a day (BID) | ORAL | Status: DC
  Administered 2022-09-04 – 2022-09-05 (×3): 300 mg via ORAL
  Filled 2022-09-04 (×3): qty 1

## 2022-09-04 MED ORDER — CARVEDILOL 12.5 MG PO TABS
25.0000 mg | ORAL_TABLET | Freq: Two times a day (BID) | ORAL | Status: DC
  Administered 2022-09-04 – 2022-09-05 (×3): 25 mg via ORAL
  Filled 2022-09-04 (×3): qty 2

## 2022-09-04 MED ORDER — FUROSEMIDE 10 MG/ML IJ SOLN: 80.0000 mg | Freq: Two times a day (BID) | INTRAMUSCULAR | Status: DC

## 2022-09-04 MED ORDER — BICTEGRAVIR-EMTRICITAB-TENOFOV 50-200-25 MG PO TABS
1.0000 | ORAL_TABLET | Freq: Every day | ORAL | Status: DC
  Administered 2022-09-04 – 2022-09-05 (×2): 1 via ORAL
  Filled 2022-09-04 (×4): qty 1

## 2022-09-04 MED ORDER — HEPARIN SODIUM (PORCINE) 5000 UNIT/ML IJ SOLN
5000.0000 [IU] | Freq: Three times a day (TID) | INTRAMUSCULAR | Status: DC
  Administered 2022-09-04 – 2022-09-05 (×3): 5000 [IU] via SUBCUTANEOUS
  Filled 2022-09-04 (×2): qty 1

## 2022-09-04 MED ORDER — ASPIRIN 81 MG PO CHEW
81.0000 mg | CHEWABLE_TABLET | Freq: Every day | ORAL | Status: DC
  Administered 2022-09-04 – 2022-09-05 (×2): 81 mg via ORAL
  Filled 2022-09-04 (×2): qty 1

## 2022-09-04 MED ORDER — NITROGLYCERIN IN D5W 200-5 MCG/ML-% IV SOLN
5.0000 ug/min | INTRAVENOUS | Status: DC
  Administered 2022-09-04: 5 ug/min via INTRAVENOUS
  Filled 2022-09-04: qty 250

## 2022-09-04 MED ORDER — ONDANSETRON HCL 4 MG PO TABS: 4.0000 mg | ORAL_TABLET | Freq: Four times a day (QID) | ORAL | Status: DC | PRN

## 2022-09-04 MED ORDER — FUROSEMIDE 10 MG/ML IJ SOLN
40.0000 mg | Freq: Once | INTRAMUSCULAR | Status: AC
  Administered 2022-09-04: 40 mg via INTRAVENOUS
  Filled 2022-09-04: qty 4

## 2022-09-04 MED ORDER — ACETAMINOPHEN 650 MG RE SUPP: 650.0000 mg | Freq: Four times a day (QID) | RECTAL | Status: DC | PRN

## 2022-09-04 MED ORDER — ACETAMINOPHEN 325 MG PO TABS: 650.0000 mg | ORAL_TABLET | Freq: Four times a day (QID) | ORAL | Status: DC | PRN

## 2022-09-04 MED ORDER — LOSARTAN POTASSIUM 50 MG PO TABS
50.0000 mg | ORAL_TABLET | Freq: Every day | ORAL | Status: DC
  Administered 2022-09-04 – 2022-09-05 (×2): 50 mg via ORAL
  Filled 2022-09-04: qty 2
  Filled 2022-09-04: qty 1

## 2022-09-04 MED ORDER — ONDANSETRON HCL 4 MG/2ML IJ SOLN: 4.0000 mg | Freq: Four times a day (QID) | INTRAMUSCULAR | Status: DC | PRN

## 2022-09-04 NOTE — ED Provider Notes (Signed)
[facility] EMERGENCY DEPARTMENT Provider Note   CSN: [id number] Arrival date & time: 09/04/22  3151     History  Chief Complaint  Patient presents with   Hypertension    Richard Cox is a 41 y.o. male.  Patient presents to the emergency department for evaluation of shortness of breath.  Patient reports that he was recently hospitalized for pneumonia.  He improved after treatment in the hospital.  He reports that he woke up approximately 2 hours ago and felt like he was very short of breath.       Home Medications Prior to Admission medications   Medication Sig Start Date End Date Taking? Authorizing Provider  acetaminophen (TYLENOL) 500 MG tablet Take 1,000 mg by mouth every 6 (six) hours as needed for moderate pain or headache.    [provider]  aspirin 81 MG chewable tablet Chew 1 tablet (81 mg total) by mouth daily. 08/28/21   Geradine Girt, DO  azithromycin (ZITHROMAX) 500 MG tablet Take 1 tablet (500 mg total) by mouth daily. 09/01/22   Orson Eva, MD  BIKTARVY 50-200-25 MG TABS tablet TAKE 1 TABLET BY MOUTH DAILY 08/28/22   Golden Circle, FNP  carvedilol (COREG) 25 MG tablet Take 1 tablet (25 mg total) by mouth 2 (two) times daily with a meal. 09/01/22   Tat, Shanon Brow, MD  cefdinir (OMNICEF) 300 MG capsule Take 1 capsule (300 mg total) by mouth every 12 (twelve) hours. 09/01/22   Orson Eva, MD  furosemide (LASIX) 40 MG tablet Take 1 tablet (40 mg total) by mouth daily as needed for fluid. 08/01/22 11/29/22  Davonna Belling, MD  hydrALAZINE (APRESOLINE) 50 MG tablet Take 1 tablet (50 mg total) by mouth 3 (three) times daily. 08/01/22   Davonna Belling, MD  losartan (COZAAR) 50 MG tablet Take 1 tablet (50 mg total) by mouth daily. 05/31/22   Sherwood Gambler, MD      Allergies    Neosporin [neomycin-bacitracin zn-polymyx]    Review of Systems   Review of Systems  Physical Exam Updated Vital Signs BP (!) 179/137 (BP Location: Right Arm)   Pulse 88    Temp 97.7 F (36.5 C) (Oral)   Resp (!) 23   Ht '6\' 1"'$  (1.854 m)   Wt 73.9 kg   SpO2 96%   BMI 21.51 kg/m  Physical Exam Vitals and nursing note reviewed.  Constitutional:      General: He is not in acute distress.    Appearance: He is well-developed.  HENT:     Head: Normocephalic and atraumatic.     Mouth/Throat:     Mouth: Mucous membranes are moist.  Eyes:     General: Vision grossly intact. Gaze aligned appropriately.     Extraocular Movements: Extraocular movements intact.     Conjunctiva/sclera: Conjunctivae normal.  Cardiovascular:     Rate and Rhythm: Normal rate and regular rhythm.     Pulses: Normal pulses.     Heart sounds: Normal heart sounds, S1 normal and S2 normal. No murmur heard.    No friction rub. No gallop.  Pulmonary:     Effort: Tachypnea, accessory muscle usage and respiratory distress present.     Breath sounds: Decreased breath sounds and rales present.  Abdominal:     Palpations: Abdomen is soft.     Tenderness: There is no abdominal tenderness. There is no guarding or rebound.     Hernia: No hernia is present.  Musculoskeletal:  General: No swelling.     Cervical back: Full passive range of motion without pain, normal range of motion and neck supple. No pain with movement, spinous process tenderness or muscular tenderness. Normal range of motion.     Right lower leg: No edema.     Left lower leg: No edema.  Skin:    General: Skin is warm and dry.     Capillary Refill: Capillary refill takes less than 2 seconds.     Findings: No ecchymosis, erythema, lesion or wound.  Neurological:     Mental Status: He is alert and oriented to person, place, and time.     GCS: GCS eye subscore is 4. GCS verbal subscore is 5. GCS motor subscore is 6.     Cranial Nerves: Cranial nerves 2-12 are intact.     Sensory: Sensation is intact.     Motor: Motor function is intact. No weakness or abnormal muscle tone.     Coordination: Coordination is intact.   Psychiatric:        Mood and Affect: Mood normal.        Speech: Speech normal.        Behavior: Behavior normal.     ED Results / Procedures / Treatments   Labs (all labs ordered are listed, but only abnormal results are displayed) Labs Reviewed  RESP PANEL BY RT-PCR (FLU A&B, COVID) ARPGX2  CBC WITH DIFFERENTIAL/PLATELET  COMPREHENSIVE METABOLIC PANEL  LACTIC ACID, PLASMA  BRAIN NATRIURETIC PEPTIDE  RAPID URINE DRUG SCREEN, HOSP PERFORMED  TROPONIN I (HIGH SENSITIVITY)    EKG None  Radiology No results found.  Procedures Procedures    Medications Ordered in ED Medications - No data to display  ED Course/ Medical Decision Making/ A&P                           Medical Decision Making Amount and/or Complexity of Data Reviewed Labs: ordered. Radiology: ordered.   Presents to the emergency department for evaluation of shortness of breath.  Patient does have a history of CHF, smokes as well.  Hospitalization 1 week ago was for sepsis with a lobar pneumonia.  Differential diagnosis now is worsening pneumonia versus CHF/pulmonary edema with hypertensive crisis.  Lab work initiated.  Treatment will be determined by chest x-ray.  Will sign out to oncoming ER physician to follow results.        Final Clinical Impression(s) / ED Diagnoses Final diagnoses:  Hypertensive urgency    Rx / DC Orders ED Discharge Orders     None         Taneika Choi, Gwenyth Allegra, MD 09/04/22 [id number]

## 2022-09-04 NOTE — ED Notes (Signed)
Notified admitting provider of pt vitals and requested clarification of pending orders. Will continue to monitor and intervene per MD recommendation.

## 2022-09-04 NOTE — ED Notes (Signed)
Pt's headache is improving with nitro wean. Will continue to monitor.

## 2022-09-04 NOTE — ED Triage Notes (Signed)
C/o of chest tightness and high blood pressure 2 hours ago. Pt was asleep and the tightness woke pt up. Dx of pneumonia earlier this week. Hx of CHF

## 2022-09-04 NOTE — Progress Notes (Deleted)
History and Physical    [name] WKG:[id number] DOB: 10-05-1981 DOA: 09/04/2022  PCP: Pcp, No  Patient coming from: Home  I have personally briefly reviewed patient's old medical records in [place]  Chief Complaint: Shortness of breath  HPI: Richard Cox is a 41 y.o. 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  Recently discharged from the hospital on 10/24 after being treated for pneumonia.  Reports that he was doing fairly well on discharge.  He had sudden onset of shortness of breath last night while he was sleeping that woke him up.  He was sleeping in a recliner for the last several months.  Denies any chest pain.  Since shortness of breath symptoms not improve, he came to the ER for evaluation.  Reports compliance with his medications, but he does report running out of Lasix approximately 2 days ago.  He does admit to dietary indiscretions and does eat higher salt containing foods.  Says he cannot afford other options due to financial limitations.  Does admit to using cocaine approximately a week ago.  ED Course: Noted to be short of breath on arrival, received Lasix and neb treatments.  Ultimately required BiPAP due to increased work of breathing.  Noted to be severely hypertensive on arrival.  X-ray imaging indicates pulmonary edema.  Troponin mildly elevated.  The patient did improve with Lasix.  Also started on nitroglycerin infusion.  He was able to come off BiPAP in the emergency room and was placed on nasal cannula oxygen.  Review of Systems: As per HPI otherwise 10 point review of systems negative.    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 His smoking use included cigarettes. He smoked an average of .25 packs per day. He has never used smokeless tobacco. He reports that he does not currently use alcohol. He reports current drug use. Drug: Marijuana.  Allergies  Allergen Reactions   Neosporin [Neomycin-Bacitracin Zn-Polymyx] Rash    Family History  Problem Relation Age of Onset   Diabetes Mellitus II Mother    Cancer Mother        multiple myeloma   Cancer Father        multiple myeloma   Congestive Heart Failure Brother    Heart Problems Brother    Lupus Brother      Prior to Admission medications   Medication Sig Start Date End Date Taking? Authorizing Provider  acetaminophen (TYLENOL) 500 MG tablet Take 1,000 mg by mouth every 6 (six) hours as needed for moderate pain or headache.   Yes [provider]  aspirin 81 MG chewable tablet Chew 1 tablet (81 mg total) by mouth daily. 08/28/21  Yes Vann, Jessica U, DO  azithromycin (ZITHROMAX) 500 MG tablet Take 1 tablet (500 mg total) by mouth daily. 09/01/22  Deveron Furlong, MD  BIKTARVY 50-200-25 MG TABS tablet TAKE 1 TABLET BY MOUTH DAILY 08/28/22  Yes Golden Circle, FNP  carvedilol (COREG) 25 MG tablet Take 1 tablet (25 mg total) by mouth 2 (two) times daily with a meal. 09/01/22  Yes Tat, Shanon Brow, MD  cefdinir (OMNICEF) 300 MG capsule Take 1 capsule (300 mg total) by mouth every 12 (twelve) hours. 09/01/22  Yes Tat, Shanon Brow, MD  furosemide (LASIX) 40 MG tablet Take 1 tablet (40 mg total) by mouth daily as needed for fluid. 08/01/22 11/29/22 Yes Davonna Belling, MD  hydrALAZINE (APRESOLINE) 50 MG tablet Take 1 tablet (50 mg total) by mouth 3 (three)  times daily. 08/01/22  Yes Davonna Belling, MD  losartan (COZAAR) 50 MG tablet Take 1 tablet (50 mg total) by mouth daily. 05/31/22  Yes Sherwood Gambler, MD    Physical Exam: Vitals:   09/04/22 1815 09/04/22 1830 09/04/22 1900 09/04/22 1913  BP: 125/85 125/63 (!) 148/88   Pulse:   88   Resp: 20 (!) 24 (!) 22   Temp:    98.4 F (36.9 C)  TempSrc:    Oral  SpO2:   96%   Weight:      Height:        Constitutional: NAD, calm, comfortable Eyes: PERRL, lids and conjunctivae normal ENMT: Mucous membranes are moist. Posterior pharynx clear of any exudate or lesions.Normal dentition.  Neck: normal, supple, no masses, no thyromegaly Respiratory: Bilateral crackles. Normal respiratory effort. No accessory muscle use.  Cardiovascular: Regular rate and rhythm, no murmurs / rubs / gallops.  1+ extremity edema. 2+ pedal pulses. No carotid bruits.  Abdomen: no tenderness, no masses palpated. No hepatosplenomegaly. Bowel sounds positive.  Musculoskeletal: no clubbing / cyanosis. No joint deformity upper and lower extremities. Good ROM, no contractures. Normal muscle tone.  Skin: no rashes, lesions, ulcers. No induration Neurologic: CN 2-12 grossly intact. Sensation intact, DTR normal. Strength 5/5 in all 4.  Psychiatric: Normal judgment and insight. Alert and oriented x 3. Normal mood.    Labs on Admission: I have personally reviewed following labs and imaging studies  CBC: Recent Labs  Lab 08/30/22 0546 08/31/22 0420 09/01/22 0503 09/04/22 0651  WBC 12.0* 7.8 6.4 7.4  NEUTROABS 9.4*  --   --  5.2  HGB 12.0* 10.5* 10.2* 10.2*  HCT 37.3* 32.9* 31.9* 32.8*  MCV 89.7 89.6 90.9 91.6  PLT 321 269 269 458   Basic Metabolic Panel: Recent Labs  Lab 08/30/22 0546 08/31/22 0420 09/01/22 0503 09/04/22 0651  NA 139 138 140 141  K 3.5 3.0* 3.5 4.6  CL 98 97* 102 107  CO2 _0 GLUCOSE 105* 115* 111* 98  BUN 25* 33* 39* 27*  CREATININE 1.43* 1.45* 1.66* 1.35*  CALCIUM 8.8* 8.6*  8.7* 8.7*   GFR: Estimated Creatinine Clearance: 76.8 mL/min (A) (by C-G formula based on SCr of 1.35 mg/dL (H)). Liver Function Tests: Recent Labs  Lab 08/30/22 0546 09/04/22 0651  AST 22 56*  ALT 30 49*  ALKPHOS 106 84  BILITOT 0.7 0.2*  PROT 7.8 7.3  ALBUMIN 3.2* 2.9*   No results for input(s): "LIPASE", "AMYLASE" in the last 168 hours. No results for input(s): "AMMONIA" in the last 168 hours. Coagulation Profile: Recent Labs  Lab 08/30/22 0546  INR 1.2   Cardiac Enzymes: No results for input(s): "CKTOTAL", "CKMB", "CKMBINDEX", "TROPONINI" in the last 168 hours. BNP (last 3 results) No results for input(s): "PROBNP" in the last 8760 hours. HbA1C: No  results for input(s): "HGBA1C" in the last 72 hours. CBG: No results for input(s): "GLUCAP" in the last 168 hours. Lipid Profile: No results for input(s): "CHOL", "HDL", "LDLCALC", "TRIG", "CHOLHDL", "LDLDIRECT" in the last 72 hours. Thyroid Function Tests: No results for input(s): "TSH", "T4TOTAL", "FREET4", "T3FREE", "THYROIDAB" in the last 72 hours. Anemia Panel: No results for input(s): "VITAMINB12", "FOLATE", "FERRITIN", "TIBC", "IRON", "RETICCTPCT" in the last 72 hours. Urine analysis:    Component Value Date/Time   COLORURINE YELLOW 08/30/2022 0625   APPEARANCEUR CLEAR 08/30/2022 0625   LABSPEC 1.016 08/30/2022 0625   PHURINE 5.0 08/30/2022 0625   GLUCOSEU NEGATIVE 08/30/2022 0625   HGBUR SMALL (A) 08/30/2022 0625   BILIRUBINUR NEGATIVE 08/30/2022 0625   KETONESUR NEGATIVE 08/30/2022 0625   PROTEINUR 100 (A) 08/30/2022 0625   NITRITE NEGATIVE 08/30/2022 0625   LEUKOCYTESUR NEGATIVE 08/30/2022 0625    Radiological Exams on Admission: DG Chest Portable 1 View  Result Date: 09/04/2022 CLINICAL DATA:  Pulmonary edema. Elevated blood pressure and chest tightness. EXAM: PORTABLE CHEST 1 VIEW COMPARISON:  08/30/2022 FINDINGS: There is a right chest wall port a catheter with tip in the projection of the SVC.  Stable cardiac enlargement. Diffuse pulmonary vascular congestion is identified. No signs of pleural effusion. The visualized osseous structures are unremarkable. IMPRESSION: Cardiac enlargement and pulmonary vascular congestion. Electronically Signed   By: Kerby Moors M.D.   On: 09/04/2022 07:35    EKG: Independently reviewed.  Sinus rhythm, LVH with strain pattern  Assessment/Plan Principal Problem:   CHF exacerbation (HCC) Active Problems:   HTN (hypertension)   Acute on chronic combined systolic and diastolic CHF (congestive heart failure) (HCC)   HIV (human immunodeficiency virus infection) (HCC)   High grade B-cell lymphoma (HCC)   Acute respiratory failure with hypoxia (HCC)   Hypertensive crisis   Cocaine abuse ([place])     Acute on chronic combined CHF -Ejection fraction of 30 to 35% with grade 3 diastolic dysfunction -Reports being compliant with Lasix, he was taking 80 mg at home daily.  Reports that he had not taken any Lasix in the last 2 days since he ran out -He normally sleeps at home in a recliner for the last several months -Suspect that acute decompensation related to severe hypertension -Started on IV Lasix with good urine output -Appreciate cardiology assistance -Wean off nitroglycerin infusion -We will reassess need for diuretics tomorrow  Hypertensive emergency -Noted to have acute decompensation of CHF with severe hypertension on admission -Placed on nitroglycerin infusion -Resume home medications including Coreg, hydralazine and losartan -Overall blood pressures have improved and we are weaning off nitroglycerin -Hypertension likely precipitated by recent cocaine use  Acute respiratory failure with hypoxia -Noted to be in severe respiratory distress on admission requiring BiPAP -He has since been weaned off of BiPAP and is on nasal cannula  Hypertrophic nonobstructive cardiomyopathy -No obstructive gradient by prior imaging -Poor ICD candidate given  ongoing substance abuse  Elevated troponin -Suspect demand ischemia in the setting of severe hypertension and heart failure -No complaints of chest pain  HIV -Continue Biktarvy -Recently checked from 08/31/2022 CD4 count 303  Recent admission for pneumonia, discharged 3 days ago -We will complete course of antibiotics that was prescribed on last discharge  Polysubstance abuse -Urine drug screen positive for cocaine -Counseled on the importance of abstinence from illicit drug use   B-cell lymphoma -Does not appear to be on any active treatment at this time -Continue follow-up with oncology  DVT prophylaxis: Heparin Code Status:  Full code Family Communication: Discussed with patient Disposition Plan: Discharge home once volume status has improved Consults called: Cardiology Admission status: Inpatient, telemetry  Kathie Dike MD [facility]   If 7PM-7AM, please contact night-coverage [email or web address]   09/04/2022, 8:16 PM

## 2022-09-04 NOTE — Consult Note (Signed)
Cardiology Consultation   Patient ID: Richard Cox MRN: [id number]; DOB: 07/24/1981  Admit date: 09/04/2022 Date of Consult: 09/04/2022  PCP:  Richard Cox No   [place] Providers Cardiologist:  Richard Lesches, MD        Patient Profile:   Richard Cox is a 41 y.o. male with a hx of HTN, medication noncompliance, chronic combined systolic/diastlic HF, HIV, CKD with prior admits with severe HTN and decompensated HF/flash pulmonary edema in setting of medication noncompliance and often cocaine +  who is being seen 09/04/2022 for the evaluation of HTN and SOB at the request of Dr Richard Cox.  History of Present Illness:   Richard Cox 41 yo male history of HTN, medication noncompliance, chronic combined systolic/diastlic HF, HIV, CKD with prior admits with severe HTN and decompensated HF/flash pulmonary edema in setting of medication noncompliance and often cocaine + presents with SOB and severe HTN. Similar to prior admits had missed his medications at home, cocaine + on admission. Also with admit just last week with sepsis secondary to pneumonia  Presents with sudden onset of SOB that woke him up from sleep. No specific chest pains. Initially in ER on bipap, weaned to [facility] after NG gtt and IV lasix.    ER vitals: 179/137 HR 89 97% RA WBC 7.4 Hgb 10.2 Plt 279 K 4.6 Cr 1.35 BUN 27 Lactic acid 1 UDS +cocaine BNP 3987 Trop 112-->106 (chrnically elevated 100s-200s) CXR: cardiomegaly, +pulm congestion EKG SR, LVH with strain pattern 08/2022 echo: severe LVH more prominent basal septum, no LVOT gradient, LVEF 30-35%, grade III dd, severe LAE  08/2021 cMRI: hypertrophic CM without obstruction, 31 mm massive septal hypertrophy in the mid left ventricular septum. LVEF 30-35%        Past Medical History:  Diagnosis Date   Acute respiratory failure with hypoxia ([place]) 08/24/2021   CHF (congestive heart failure) ([place])    Endotracheally intubated 08/24/2021   Essential hypertension    HIV  infection ([place])    Lymphoma ([place]) 08/28/2020   Noncompliance with medication regimen    Secondary cardiomyopathy [facility])     Past Surgical History:  Procedure Laterality Date   IR IMAGING GUIDED PORT INSERTION  08/23/2020   Right   NO PAST SURGERIES         Inpatient Medications: Scheduled Meds:  Continuous Infusions:  nitroGLYCERIN 60 mcg/min (09/04/22 0920)   PRN Meds:   Allergies:    Allergies  Allergen Reactions   Neosporin [Neomycin-Bacitracin Zn-Polymyx] Rash    Social History:   Social History   Socioeconomic History   Marital status: Single    Spouse name: Not on file   Number of children: 0   Years of education: 13   Highest education level: Not on file  Occupational History   Not on file  Tobacco Use   Smoking status: Former    Packs/day: 0.25    Types: Cigarettes   Smokeless tobacco: Never  Vaping Use   Vaping Use: Never used  Substance and Sexual Activity   Alcohol use: Not Currently   Drug use: Yes    Types: Marijuana   Sexual activity: Not Currently    Comment: declined condoms  Other Topics Concern   Not on file  Social History Narrative   Not on file   Social Determinants of Health   Financial Resource Strain: Low Risk  (09/10/2020)   Overall Financial Resource Strain (CARDIA)    Difficulty of Paying Living Expenses: Not very hard  Food Insecurity:  No Food Insecurity (08/31/2022)   Hunger Vital Sign    Worried About Running Out of Food in the Last Year: Never true    Ran Out of Food in the Last Year: Never true  Transportation Needs: No Transportation Needs (08/31/2022)   PRAPARE - Hydrologist (Medical): No    Lack of Transportation (Non-Medical): No  Physical Activity: Insufficiently Active (09/10/2020)   Exercise Vital Sign    Days of Exercise per Week: 5 days    Minutes of Exercise per Session: 20 min  Stress: No Stress Concern Present (09/10/2020)   [place]    Feeling of Stress : Only a little  Social Connections: Socially Isolated (09/10/2020)   Social Connection and Isolation Panel [NHANES]    Frequency of Communication with Friends and Family: More than three times a week    Frequency of Social Gatherings with Friends and Family: Never    Attends Religious Services: Never    Marine scientist or Organizations: No    Attends Music therapist: Not on file    Marital Status: Never married  Intimate Partner Violence: Not At Risk (08/31/2022)   Humiliation, Afraid, Rape, and Kick questionnaire    Fear of Current or Ex-Partner: No    Emotionally Abused: No    Physically Abused: No    Sexually Abused: No    Family History:    Family History  Problem Relation Age of Onset   Diabetes Mellitus II Mother    Cancer Mother        multiple myeloma   Cancer Father        multiple myeloma   Congestive Heart Failure Brother    Heart Problems Brother    Lupus Brother      ROS:  Please see the history of present illness.   All other ROS reviewed and negative.     Physical Exam/Data:   Vitals:   09/04/22 1145 09/04/22 1200 09/04/22 1215 09/04/22 1245  BP: (!) 162/111 (!) 173/112 (!) 172/101 (!) 173/104  Pulse: 82 86 83 83  Resp: (!) _0 Temp:      TempSrc:      SpO2: 97% 96% 96% 96%  Weight:      Height:        Intake/Output Summary (Last 24 hours) at 09/04/2022 1345 Last data filed at 09/04/2022 1023 Gross per 24 hour  Intake --  Output 1500 ml  Net -1500 ml      09/04/2022    6:40 AM 09/01/2022    5:13 AM 08/30/2022    5:10 AM  Last 3 Weights  Weight (lbs) 163 lb 163 lb 9.3 oz 164 lb 14.5 oz  Weight (kg) 73.936 kg 74.2 kg 74.8 kg     Body mass index is 21.51 kg/m.  General:  Well nourished, well developed, in no acute distress HEENT: normal Neck: no JVD Vascular: No carotid bruits; Distal pulses 2+ bilaterally Cardiac:  normal S1, S2; RRR; no murmur   Lungs:  corase bilaterally Abd: soft, nontender, no hepatomegaly  Ext: no edema Musculoskeletal:  No deformities, BUE and BLE strength normal and equal Skin: warm and dry  Neuro:  CNs 2-12 intact, no focal abnormalities noted Psych:  Normal affect     Laboratory Data:  High Sensitivity Troponin:   Recent Labs  Lab 08/13/22 0957 08/30/22 0546 08/30/22 0750 09/04/22 0651 09/04/22 0848  TROPONINIHS  153* 223* 201* 112* 106*     Chemistry Recent Labs  Lab 08/31/22 0420 09/01/22 0503 09/04/22 0651  NA 138 140 141  K 3.0* 3.5 4.6  CL 97* 102 107  CO2 _0 GLUCOSE 115* 111* 98  BUN 33* 39* 27*  CREATININE 1.45* 1.66* 1.35*  CALCIUM 8.6* 8.7* 8.7*  GFRNONAA >60 53* >60  ANIONGAP _1 Recent Labs  Lab 08/30/22 0546 09/04/22 0651  PROT 7.8 7.3  ALBUMIN 3.2* 2.9*  AST 22 56*  ALT 30 49*  ALKPHOS 106 84  BILITOT 0.7 0.2*   Lipids No results for input(s): "CHOL", "TRIG", "HDL", "LABVLDL", "LDLCALC", "CHOLHDL" in the last 168 hours.  Hematology Recent Labs  Lab 08/31/22 0420 09/01/22 0503 09/04/22 0651  WBC 7.8 6.4 7.4  RBC 3.67* 3.51* 3.58*  HGB 10.5* 10.2* 10.2*  HCT 32.9* 31.9* 32.8*  MCV 89.6 90.9 91.6  MCH 28.6 29.1 28.5  MCHC 31.9 32.0 31.1  RDW 17.2* 17.2* 17.3*  PLT 269 269 279   Thyroid No results for input(s): "TSH", "FREET4" in the last 168 hours.  BNP Recent Labs  Lab 08/30/22 0546 09/04/22 0727  BNP >4,500.0* 3,987.0*    DDimer No results for input(s): "DDIMER" in the last 168 hours.   Radiology/Studies:  DG Chest Portable 1 View  Result Date: 09/04/2022 CLINICAL DATA:  Pulmonary edema. Elevated blood pressure and chest tightness. EXAM: PORTABLE CHEST 1 VIEW COMPARISON:  08/30/2022 FINDINGS: There is a right chest wall port a catheter with tip in the projection of the SVC. Stable cardiac enlargement. Diffuse pulmonary vascular congestion is identified. No signs of pleural effusion. The visualized osseous structures are  unremarkable. IMPRESSION: Cardiac enlargement and pulmonary vascular congestion. Electronically Signed   By: Kerby Moors M.D.   On: 09/04/2022 07:35   ECHOCARDIOGRAM COMPLETE  Result Date: 08/31/2022    ECHOCARDIOGRAM REPORT   Patient Name:   Richard Cox Date of Exam: 08/31/2022 Medical Rec #:  [id number]  Height:       73.0 in Accession #:    [id number] Weight:       164.9 lb Date of Birth:  Feb 26, 1981   BSA:          1.982 m Patient Age:    32 years   BP:           175/121 mmHg Patient Gender: M          HR:           82 bpm. Exam Location:  [facility] Procedure: 2D Echo, Cardiac Doppler and Color Doppler Indications:    CHF  History:        Patient has prior history of Echocardiogram examinations, most                 recent 08/24/2021. CHF; Risk Factors:Hypertension and Current                 Smoker. Substance abuse, ETOH abuse.  Sonographer:    Wenda Low Referring Phys: [phone] DAVID TAT IMPRESSIONS  1. Severe LVH with far more prominent hypertrophy of the septum. There is no dynamic LVOT gradient. 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 . Left ventricular ejection fraction, by estimation, is 30 to 35%. The left ventricle has moderately decreased function. The left ventricle demonstrates global hypokinesis. There is severe asymmetric left ventricular hypertrophy of the septal segment. Left ventricular diastolic parameters are consistent with Grade III diastolic dysfunction (restrictive).  2.  Right ventricular systolic function is normal. The right ventricular size is normal. There is normal pulmonary artery systolic pressure.  3. Left atrial size was severely dilated.  4. Right atrial size was moderately dilated.  5. The mitral valve is normal in structure. No evidence of mitral valve regurgitation. No evidence of mitral stenosis.  6. The aortic valve has an indeterminant number of cusps. Aortic valve regurgitation is not visualized. No aortic stenosis is present.  7. The  inferior vena cava is normal in size with greater than 50% respiratory variability, suggesting right atrial pressure of 3 mmHg. FINDINGS  Left Ventricle: Severe LVH with far more prominent hypertrophy of the septum. There is no dynamic LVOT gradient. 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 Left ventricular ejection fraction, by estimation, is 30 to 35%. The left ventricle has moderately decreased function. The left ventricle demonstrates global hypokinesis. The left ventricular internal cavity size was normal in size. There is severe asymmetric left ventricular hypertrophy of the septal segment. Left ventricular diastolic parameters are consistent with Grade III diastolic dysfunction (restrictive). Right Ventricle: The right ventricular size is normal. Right vetricular wall thickness was not well visualized. Right ventricular systolic function is normal. There is normal pulmonary artery systolic pressure. The tricuspid regurgitant velocity is 1.96 m/s, and with an assumed right atrial pressure of 3 mmHg, the estimated right ventricular systolic pressure is 03.4 mmHg. Left Atrium: Left atrial size was severely dilated. Right Atrium: Right atrial size was moderately dilated. Pericardium: There is no evidence of pericardial effusion. Mitral Valve: The mitral valve is normal in structure. No evidence of mitral valve regurgitation. No evidence of mitral valve stenosis. MV peak gradient, 4.6 mmHg. The mean mitral valve gradient is 1.0 mmHg. Tricuspid Valve: The tricuspid valve is normal in structure. Tricuspid valve regurgitation is not demonstrated. No evidence of tricuspid stenosis. Aortic Valve: The aortic valve has an indeterminant number of cusps. Aortic valve regurgitation is not visualized. No aortic stenosis is present. Aortic valve mean gradient measures 3.0 mmHg. Aortic valve peak gradient measures 5.1 mmHg. Aortic valve area, by VTI measures 4.16 cm. Pulmonic Valve: The pulmonic  valve was not well visualized. Pulmonic valve regurgitation is mild. No evidence of pulmonic stenosis. Aorta: The aortic root is normal in size and structure. Venous: The inferior vena cava is normal in size with greater than 50% respiratory variability, suggesting right atrial pressure of 3 mmHg. IAS/Shunts: No atrial level shunt detected by color flow Doppler.  LEFT VENTRICLE PLAX 2D LVIDd:         5.50 cm   Diastology LVIDs:         3.50 cm   LV e' lateral:   8.33 cm/s LV PW:         1.70 cm   LV E/e' lateral: 11.4 LV IVS:        3.40 cm LVOT diam:     2.50 cm LV SV:         74 LV SV Index:   37 LVOT Area:     4.91 cm  RIGHT VENTRICLE RV Basal diam:  3.80 cm RV Mid diam:    3.40 cm RV S prime:     10.10 cm/s TAPSE (M-mode): 2.3 cm LEFT ATRIUM            Index        RIGHT ATRIUM           Index LA diam:      5.70 cm  2.88 cm/m  RA Area:     20.20 cm LA Vol (A2C): 89.5 ml  45.15 ml/m  RA Volume:   61.10 ml  30.82 ml/m LA Vol (A4C): 145.0 ml 73.15 ml/m  AORTIC VALVE                    PULMONIC VALVE AV Area (Vmax):    4.47 cm     PV Vmax:       1.02 m/s AV Area (Vmean):   4.34 cm     PV Peak grad:  4.2 mmHg AV Area (VTI):     4.16 cm AV Vmax:           113.00 cm/s AV Vmean:          77.100 cm/s AV VTI:            0.178 m AV Peak Grad:      5.1 mmHg AV Mean Grad:      3.0 mmHg LVOT Vmax:         103.00 cm/s LVOT Vmean:        68.100 cm/s LVOT VTI:          0.151 m LVOT/AV VTI ratio: 0.85  AORTA Ao Root diam: 3.50 cm MITRAL VALVE               TRICUSPID VALVE MV Area (PHT): 5.62 cm    TR Peak grad:   15.4 mmHg MV Area VTI:   3.76 cm    TR Vmax:        196.00 cm/s MV Peak grad:  4.6 mmHg MV Mean grad:  1.0 mmHg    SHUNTS MV Vmax:       1.07 m/s    Systemic VTI:  0.15 m MV Vmean:      50.1 cm/s   Systemic Diam: 2.50 cm MV Decel Time: 135 msec MV E velocity: 95.10 cm/s MV A velocity: 44.10 cm/s MV E/A ratio:  2.16 Carlyle Dolly MD Electronically signed by Carlyle Dolly MD Signature Date/Time:  08/31/2022/3:17:18 PM    Final      Assessment and Plan:   1.Acute on chronic combined systolic/diastolic HF - 76/2831 echo: severe LVH more prominent basal septum, no LVOT gradient, LVEF 30-35%, grade III dd, severe LAE - recurring pattern of admissions with severe HTN and volume overload in setting of medication noncompliance and cocaine use. This admit consistent with prior - BNP 3987, CXR pulm congestion  - started on NG gtt in setting of severe HTN, pulm edema - given IV lasix 34m x 1 in ER at 730 AM, 1.5 L urine output. Redose IV 474magain, reassess in AM continued doses.  - resume home coreg, losartan. In past compliance and cost of meds has been an issue, if establishes as outpatient consistently could work to get on entresto, aldactone, perhaps SGLT2i.    2. HTN - noncompliant with home meds, cocaine + - on NG gtt, would titrate to goal SBP <150. Would not lower too aggressively too quickly - resume home oral regimen coreg 2566mid, hydral 56m54md, losartan 56mg42m 3.Hypertrophic nonobstructive CM - no obstructive gradient by prior imaging - poor ICD candidate given ongoing cocaine use. Cancer history and history of HIV unclear overall prognosis.     4. Elevated troponin - chronically elevated trop during prior admissions. Demand ischemia in setting of severe HTN, hyptertrophic CM, HF, hypoxia.    5. HIV - per primary team  I think presenation consistent  with prior. Can manage blood pressure and diuresis here at [facility], do not see strong indication for transfer to [facility] at this time.    For questions or updates, please contact [place] Please consult [email or web address] for contact info under    Signed, Carlyle Dolly, MD  09/04/2022 1:45 PM

## 2022-09-04 NOTE — ED Notes (Signed)
Pt states nitro drip is giving him a severe headache and requests discontinuation. MD notified; awaiting orders and will continue to monitor pt.

## 2022-09-04 NOTE — ED Notes (Signed)
Notified provider of pt's critical troponin 112

## 2022-09-04 NOTE — ED Notes (Signed)
Notified provider of pt's continued hypertension despite nitro drip at max titration per current order. Pt is in NAD and breathing is significantly improved after being on bipap.

## 2022-09-05 ENCOUNTER — Other Ambulatory Visit: Payer: Self-pay

## 2022-09-05 DIAGNOSIS — F1410 Cocaine abuse, uncomplicated: Secondary | ICD-10-CM | POA: Diagnosis not present

## 2022-09-05 DIAGNOSIS — I5021 Acute systolic (congestive) heart failure: Secondary | ICD-10-CM | POA: Diagnosis not present

## 2022-09-05 DIAGNOSIS — J9601 Acute respiratory failure with hypoxia: Secondary | ICD-10-CM | POA: Diagnosis not present

## 2022-09-05 DIAGNOSIS — I509 Heart failure, unspecified: Secondary | ICD-10-CM | POA: Diagnosis not present

## 2022-09-05 LAB — CBC
HCT: 32.8 % — ABNORMAL LOW (ref 39.0–52.0)
Hemoglobin: 10.6 g/dL — ABNORMAL LOW (ref 13.0–17.0)
MCH: 29 pg (ref 26.0–34.0)
MCHC: 32.3 g/dL (ref 30.0–36.0)
MCV: 89.9 fL (ref 80.0–100.0)
Platelets: 269 10*3/uL (ref 150–400)
RBC: 3.65 MIL/uL — ABNORMAL LOW (ref 4.22–5.81)
RDW: 17.2 % — ABNORMAL HIGH (ref 11.5–15.5)
WBC: 6.2 10*3/uL (ref 4.0–10.5)
nRBC: 0 % (ref 0.0–0.2)

## 2022-09-05 LAB — BRAIN NATRIURETIC PEPTIDE: B Natriuretic Peptide: 3302 pg/mL — ABNORMAL HIGH (ref 0.0–100.0)

## 2022-09-05 LAB — BASIC METABOLIC PANEL
Anion gap: 7 (ref 5–15)
BUN: 30 mg/dL — ABNORMAL HIGH (ref 6–20)
CO2: 25 mmol/L (ref 22–32)
Calcium: 8.6 mg/dL — ABNORMAL LOW (ref 8.9–10.3)
Chloride: 106 mmol/L (ref 98–111)
Creatinine, Ser: 1.45 mg/dL — ABNORMAL HIGH (ref 0.61–1.24)
GFR, Estimated: >60 mL/min (ref >60)
Glucose, Bld: 111 mg/dL — ABNORMAL HIGH (ref 70–99)
Potassium: 3.8 mmol/L (ref 3.5–5.1)
Sodium: 138 mmol/L (ref 135–145)

## 2022-09-05 MED ORDER — FUROSEMIDE 10 MG/ML IJ SOLN
80.0000 mg | Freq: Once | INTRAMUSCULAR | Status: AC
  Administered 2022-09-05: 80 mg via INTRAVENOUS
  Filled 2022-09-05: qty 8

## 2022-09-05 MED ORDER — HYDRALAZINE HCL 25 MG PO TABS
100.0000 mg | ORAL_TABLET | Freq: Three times a day (TID) | ORAL | Status: DC
  Administered 2022-09-05 (×2): 100 mg via ORAL
  Filled 2022-09-05 (×3): qty 4

## 2022-09-05 MED ORDER — HYDRALAZINE HCL 100 MG PO TABS: 50.0000 mg | ORAL_TABLET | Freq: Three times a day (TID) | ORAL | 1 refills | Status: DC

## 2022-09-05 MED ORDER — FUROSEMIDE 80 MG PO TABS: 80.0000 mg | ORAL_TABLET | Freq: Every day | ORAL | 1 refills | Status: DC

## 2022-09-05 NOTE — Discharge Summary (Signed)
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  Noted to be markedly hypertensive.  Imaging indicated pulmonary edema.  EKG did not show any acute changes.  Troponin was mildly elevated at baseline.  He did admit to recent cocaine use which likely precipitated his symptoms.  He also ran out of his Lasix approximately 2 days prior to admission.  On arrival, he was started on nitroglycerin infusion.  He also received IV Lasix and was restarted on his home antihypertensive medications.  He had good urine output with IV Lasix.  He was seen by cardiology who recommended continued IV diuresis.  By the following day, patient was on room air and was ambulating without any shortness of breath.  He did feel ready for discharge home.  His home Lasix dose was increased to 80 mg daily.  Hydralazine was also increased to 100 mg 3 times daily.  Continued on Coreg 25 mg twice daily and losartan 50 mg daily.  If he can maintain follow-up with cardiology, could consider starting him on Entresto and SGLT2 inhibitor.  Patient feels ready for discharge home and is eager to leave.  Oxygen saturations normal on room air without any shortness of breath.  Overall blood pressure is better controlled, although I suspect it is chronically elevated.  Discharge Diagnoses:  Principal Problem:   CHF exacerbation ([place]) Active Problems:   HTN (hypertension)   Acute on chronic combined  systolic and diastolic CHF (congestive heart failure) (HCC)   HIV (human immunodeficiency virus infection) ([place])   High grade B-cell lymphoma (HCC)   Acute respiratory failure with hypoxia ([employer])   Hypertensive crisis   Cocaine abuse [facility])    Discharge Instructions  Discharge Instructions     Diet - low sodium heart healthy   Complete by: As directed    Increase activity slowly   Complete by: As directed       Allergies as of 09/05/2022       Reactions   Neosporin [neomycin-bacitracin Zn-polymyx] Rash        Medication List     TAKE these medications    acetaminophen 500 MG tablet Commonly known as: TYLENOL Take 1,000 mg by mouth every 6 (six) hours as needed for moderate pain or headache.   aspirin 81 MG chewable tablet Chew 1 tablet (81 mg total) by mouth daily.   azithromycin 500 MG tablet Commonly known as: ZITHROMAX Take 1 tablet (500 mg total) by mouth daily.   Biktarvy 50-200-25 MG Tabs tablet Generic drug: bictegravir-emtricitabine-tenofovir AF TAKE 1 TABLET BY MOUTH DAILY   carvedilol 25 MG tablet Commonly known as: COREG Take 1 tablet (25 mg total) by mouth 2 (two) times daily with a meal.   cefdinir 300 MG capsule Commonly known as: OMNICEF Take 1 capsule (300 mg total) by mouth every 12 (twelve) hours.   furosemide 80 MG tablet Commonly known as: LASIX Take 1 tablet (80 mg total) by mouth daily. What changed:  medication strength how much to take when to take this reasons  to take this   hydrALAZINE 100 MG tablet Commonly known as: APRESOLINE Take 0.5 tablets (50 mg total) by mouth 3 (three) times daily. What changed: medication strength   losartan 50 MG tablet Commonly known as: COZAAR Take 1 tablet (50 mg total) by mouth daily.        Allergies  Allergen Reactions   Neosporin [Neomycin-Bacitracin Zn-Polymyx] Rash    Consultations: Cardiology   Procedures/Studies: DG Chest Portable 1 View  Result Date:  09/04/2022 CLINICAL DATA:  Pulmonary edema. Elevated blood pressure and chest tightness. EXAM: PORTABLE CHEST 1 VIEW COMPARISON:  08/30/2022 FINDINGS: There is a right chest wall port a catheter with tip in the projection of the SVC. Stable cardiac enlargement. Diffuse pulmonary vascular congestion is identified. No signs of pleural effusion. The visualized osseous structures are unremarkable. IMPRESSION: Cardiac enlargement and pulmonary vascular congestion. Electronically Signed   By: Kerby Moors M.D.   On: 09/04/2022 07:35   ECHOCARDIOGRAM COMPLETE  Result Date: 08/31/2022    ECHOCARDIOGRAM REPORT   Patient Name:   OCEAN SCHILDT Date of Exam: 08/31/2022 Medical Rec #:  [id number]  Height:       73.0 in Accession #:    [id number] Weight:       164.9 lb Date of Birth:  04-May-1981   BSA:          1.982 m Patient Age:    49 years   BP:           175/121 mmHg Patient Gender: M          HR:           82 bpm. Exam Location:  [facility] Procedure: 2D Echo, Cardiac Doppler and Color Doppler Indications:    CHF  History:        Patient has prior history of Echocardiogram examinations, most                 recent 08/24/2021. CHF; Risk Factors:Hypertension and Current                 Smoker. Substance abuse, ETOH abuse.  Sonographer:    Wenda Low Referring Phys: [id number] DAVID TAT IMPRESSIONS  1. Severe LVH with far more prominent hypertrophy of the septum. There is no dynamic LVOT gradient. 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 . Left ventricular ejection fraction, by estimation, is 30 to 35%. The left ventricle has moderately decreased function. The left ventricle demonstrates global hypokinesis. There is severe asymmetric left ventricular hypertrophy of the septal segment. Left ventricular diastolic parameters are consistent with Grade III diastolic dysfunction (restrictive).  2. Right ventricular systolic function is normal. The right ventricular size is normal. There is normal  pulmonary artery systolic pressure.  3. Left atrial size was severely dilated.  4. Right atrial size was moderately dilated.  5. The mitral valve is normal in structure. No evidence of mitral valve regurgitation. No evidence of mitral stenosis.  6. The aortic valve has an indeterminant number of cusps. Aortic valve regurgitation is not visualized. No aortic stenosis is present.  7. The inferior vena cava is normal in size with greater than 50% respiratory variability, suggesting right atrial pressure of 3 mmHg. FINDINGS  Left Ventricle: Severe LVH with far more prominent hypertrophy of the septum. There is no dynamic LVOT gradient. 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 Left ventricular ejection fraction, by estimation, is 30 to 35%. The left ventricle has moderately decreased function. The left ventricle demonstrates global hypokinesis. The left ventricular  internal cavity size was normal in size. There is severe asymmetric left ventricular hypertrophy of the septal segment. Left ventricular diastolic parameters are consistent with Grade III diastolic dysfunction (restrictive). Right Ventricle: The right ventricular size is normal. Right vetricular wall thickness was not well visualized. Right ventricular systolic function is normal. There is normal pulmonary artery systolic pressure. The tricuspid regurgitant velocity is 1.96 m/s, and with an assumed right atrial pressure of 3 mmHg, the estimated right ventricular systolic pressure is 44.0 mmHg. Left Atrium: Left atrial size was severely dilated. Right Atrium: Right atrial size was moderately dilated. Pericardium: There is no evidence of pericardial effusion. Mitral Valve: The mitral valve is normal in structure. No evidence of mitral valve regurgitation. No evidence of mitral valve stenosis. MV peak gradient, 4.6 mmHg. The mean mitral valve gradient is 1.0 mmHg. Tricuspid Valve: The tricuspid valve is normal in structure. Tricuspid  valve regurgitation is not demonstrated. No evidence of tricuspid stenosis. Aortic Valve: The aortic valve has an indeterminant number of cusps. Aortic valve regurgitation is not visualized. No aortic stenosis is present. Aortic valve mean gradient measures 3.0 mmHg. Aortic valve peak gradient measures 5.1 mmHg. Aortic valve area, by VTI measures 4.16 cm. Pulmonic Valve: The pulmonic valve was not well visualized. Pulmonic valve regurgitation is mild. No evidence of pulmonic stenosis. Aorta: The aortic root is normal in size and structure. Venous: The inferior vena cava is normal in size with greater than 50% respiratory variability, suggesting right atrial pressure of 3 mmHg. IAS/Shunts: No atrial level shunt detected by color flow Doppler.  LEFT VENTRICLE PLAX 2D LVIDd:         5.50 cm   Diastology LVIDs:         3.50 cm   LV e' lateral:   8.33 cm/s LV PW:         1.70 cm   LV E/e' lateral: 11.4 LV IVS:        3.40 cm LVOT diam:     2.50 cm LV SV:         74 LV SV Index:   37 LVOT Area:     4.91 cm  RIGHT VENTRICLE RV Basal diam:  3.80 cm RV Mid diam:    3.40 cm RV S prime:     10.10 cm/s TAPSE (M-mode): 2.3 cm LEFT ATRIUM            Index        RIGHT ATRIUM           Index LA diam:      5.70 cm  2.88 cm/m   RA Area:     20.20 cm LA Vol (A2C): 89.5 ml  45.15 ml/m  RA Volume:   61.10 ml  30.82 ml/m LA Vol (A4C): 145.0 ml 73.15 ml/m  AORTIC VALVE                    PULMONIC VALVE AV Area (Vmax):    4.47 cm     PV Vmax:       1.02 m/s AV Area (Vmean):   4.34 cm     PV Peak grad:  4.2 mmHg AV Area (VTI):     4.16 cm AV Vmax:           113.00 cm/s AV Vmean:          77.100 cm/s AV VTI:            0.178 m AV Peak Grad:  5.1 mmHg AV Mean Grad:      3.0 mmHg LVOT Vmax:         103.00 cm/s LVOT Vmean:        68.100 cm/s LVOT VTI:          0.151 m LVOT/AV VTI ratio: 0.85  AORTA Ao Root diam: 3.50 cm MITRAL VALVE               TRICUSPID VALVE MV Area (PHT): 5.62 cm    TR Peak grad:   15.4 mmHg MV Area VTI:    3.76 cm    TR Vmax:        196.00 cm/s MV Peak grad:  4.6 mmHg MV Mean grad:  1.0 mmHg    SHUNTS MV Vmax:       1.07 m/s    Systemic VTI:  0.15 m MV Vmean:      50.1 cm/s   Systemic Diam: 2.50 cm MV Decel Time: 135 msec MV E velocity: 95.10 cm/s MV A velocity: 44.10 cm/s MV E/A ratio:  2.16 Carlyle Dolly MD Electronically signed by Carlyle Dolly MD Signature Date/Time: 08/31/2022/3:17:18 PM    Final    CT CHEST WO CONTRAST  Result Date: 08/30/2022 CLINICAL DATA:  41 year old male with history of acute respiratory illness. EXAM: CT CHEST WITHOUT CONTRAST TECHNIQUE: Multidetector CT imaging of the chest was performed following the standard protocol without IV contrast. RADIATION DOSE REDUCTION: This exam was performed according to the departmental dose-optimization program which includes automated exposure control, adjustment of the mA and/or kV according to patient size and/or use of iterative reconstruction technique. COMPARISON:  Chest CT 05/31/2022. FINDINGS: Cardiovascular: Heart size is mildly enlarged. There is no significant pericardial fluid, thickening or pericardial calcification. No atherosclerotic calcifications are noted in the thoracic aorta or the coronary arteries. Right internal jugular single-lumen Port-A-Cath with tip terminating in the distal superior vena cava. Dilatation of the pulmonic trunk (3.9 cm in diameter), concerning for pulmonary arterial hypertension. Mediastinum/Nodes: Small amount of amorphous soft tissue in the anterior mediastinum, favored to represent residual thymic tissue, stable compared to the prior examinations. Multiple prominent nonenlarged mediastinal and bilateral hilar lymph nodes, nonspecific, but statistically likely benign. Esophagus is unremarkable in appearance. No axillary lymphadenopathy. Lungs/Pleura: Diffuse bronchial wall thickening with some patchy areas of thickening of the peribronchovascular interstitium, many of which demonstrate associated  ground-glass attenuation and surrounding peribronchovascular micro and macronodularity, including nodular regions of airspace consolidation, most evident in the left lower lobe. No pleural effusions. Upper Abdomen: Unremarkable. Musculoskeletal: There are no aggressive appearing lytic or blastic lesions noted in the visualized portions of the skeleton. IMPRESSION: 1. The appearance the chest is most compatible with severe multilobar bilateral bronchopneumonia, most evident in the left lower lobe. 2. Cardiomegaly. 3. Dilatation of the pulmonic trunk (3.9 cm in diameter), concerning for pulmonary arterial hypertension. Electronically Signed   By: Vinnie Langton M.D.   On: 08/30/2022 10:01   DG Chest Port 1 View  Result Date: 08/30/2022 CLINICAL DATA:  Shortness of breath and possible sepsis. EXAM: PORTABLE CHEST 1 VIEW COMPARISON:  Portable chest 08/13/2022 FINDINGS: Right IJ port catheter tip remains in the SVC. The heart is moderately enlarged. Central vascular prominence is again noted and mild interstitial edema in the central lungs and lower lung zones with trace pleural effusions beginning to form. Findings consistent with CHF or fluid overload. No focal airspace infiltrate is seen. The mediastinum is normally outlined. Thoracic cage intact. Multiple overlying monitor wires. IMPRESSION: Cardiomegaly  perihilar vascular congestion and mild interstitial edema. No focal airspace infiltrate. Trace pleural effusions. Findings consistent with CHF or fluid overload, less likely interstitial pneumonitis. Progress chest films recommended depending on clinical response. Compare: Similar findings on the prior study but the interstitial edema today is less pronounced. Electronically Signed   By: Telford Nab M.D.   On: 08/30/2022 05:55   DG Chest Port 1 View  Result Date: 08/13/2022 CLINICAL DATA:  Provided history: Cough. Shortness of breath and weakness. EXAM: PORTABLE CHEST 1 VIEW COMPARISON:  [place]  08/12/2022 and earlier.  Chest CT 05/31/2022. FINDINGS: Right chest infusion port catheter with tip projecting at the level of the mid/lower SVC. Cardiomegaly, unchanged. Prominence of the interstitial lung markings, bilaterally. No appreciable airspace consolidation. No evidence of pleural effusion or pneumothorax. No acute bony abnormality identified. IMPRESSION: Cardiomegaly. Prominence of the interstitial lung markings bilaterally, likely reflecting interstitial edema. Electronically Signed   By: Kellie Simmering D.O.   On: 08/13/2022 10:43   DG Chest 2 View  Result Date: 08/12/2022 CLINICAL DATA:  Shortness of breath for 2 days EXAM: CHEST - 2 VIEW COMPARISON:  August 01, 2022 FINDINGS: Right-sided port in place with the tip in the upper SVC. Cardiomegaly, unchanged. No pleural effusion. No pneumothorax. No focal airspace opacity. No displaced rib fractures. Vertebral body heights are maintained. Likely mild pulmonary edema. IMPRESSION: Cardiomegaly with likely mild pulmonary edema, similar to prior. Electronically Signed   By: Marin Roberts M.D.   On: 08/12/2022 15:59      Subjective: Patient says he is feeling better today.  Able to ambulate without shortness of breath.  He is eager to discharge home today.  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  He feels he has a discharge today.  [place], he does feel improved and feels as though he can manage at home now.  He ambulated with staff in the hall was not having any shortness of breath.  Discharge Exam: Vitals:   09/05/22 0432 09/05/22 0437 09/05/22 0817 09/05/22 1332  BP: (!) 182/128  (!) 163/108 (!) 145/102  Pulse: 84  84 78  Resp: (!) 22     Temp: 98.1 F (36.7 C)  98.3 F (36.8 C) 98.3 F (36.8 C)  TempSrc: Oral  Oral   SpO2: 97%  95% 93%  Weight:  74.1 kg    Height:        General: Pt is alert, awake, not in acute distress Cardiovascular: RRR, S1/S2 +, no rubs, no  gallops Respiratory: CTA bilaterally, no wheezing, no rhonchi Abdominal: Soft, NT, ND, bowel sounds + Extremities: no edema, no cyanosis    The results of significant diagnostics from this hospitalization (including imaging, microbiology, ancillary and laboratory) are listed below for reference.     Microbiology: Recent Results (from the past 240 hour(s))  Urine Culture     Status: Abnormal   Collection Time: 08/30/22  5:11 AM   Specimen: In/Out Cath Urine  Result Value Ref Range Status   Specimen Description   Final    IN/OUT CATH URINE Performed at [facility], [address], [place], [place] [address]    Special Requests   Final    NONE Performed at [facility], [address] [place],  [address]    Culture MULTIPLE SPECIES PRESENT, SUGGEST RECOLLECTION (A)  Final   Report Status 09/01/2022 FINAL  Final  Resp Panel by RT-PCR (Flu A&B, Covid) Anterior Nasal  Swab     Status: None   Collection Time: 08/30/22  5:12 AM   Specimen: Anterior Nasal Swab  Result Value Ref Range Status   SARS Coronavirus 2 by RT PCR NEGATIVE NEGATIVE Final    Comment: (NOTE) SARS-CoV-2 target nucleic acids are NOT DETECTED.  The SARS-CoV-2 RNA is generally detectable in upper respiratory specimens during the acute phase of infection. The lowest concentration of SARS-CoV-2 viral copies this assay can detect is 138 copies/mL. A negative result does not preclude SARS-Cov-2 infection and should not be used as the sole basis for treatment or other patient management decisions. A negative result may occur with  improper specimen collection/handling, submission of specimen other than nasopharyngeal swab, presence of viral mutation(s) within the areas targeted by this assay, and inadequate number of viral copies(<138 copies/mL). A negative result must be combined with clinical observations, patient history, and epidemiological information. The expected result is Negative.  Fact Sheet for  Patients:  EntrepreneurPulse.com.au  Fact Sheet for Healthcare Providers:  IncredibleEmployment.be  This test is no t yet approved or cleared by the [place] FDA and  has been authorized for detection and/or diagnosis of SARS-CoV-2 by FDA under an Emergency Use Authorization (EUA). This EUA will remain  in effect (meaning this test can be used) for the duration of the COVID-19 declaration under Section 564(b)(1) of the Act, 21 U.S.C.section 360bbb-3(b)(1), unless the authorization is terminated  or revoked sooner.       Influenza A by PCR NEGATIVE NEGATIVE Final   Influenza B by PCR NEGATIVE NEGATIVE Final    Comment: (NOTE) The Xpert Xpress SARS-CoV-2/FLU/RSV plus assay is intended as an aid in the diagnosis of influenza from Nasopharyngeal swab specimens and should not be used as a sole basis for treatment. Nasal washings and aspirates are unacceptable for Xpert Xpress SARS-CoV-2/FLU/RSV testing.  Fact Sheet for Patients: EntrepreneurPulse.com.au  Fact Sheet for Healthcare Providers: IncredibleEmployment.be  This test is not yet approved or cleared by the [place] FDA and has been authorized for detection and/or diagnosis of SARS-CoV-2 by FDA under an Emergency Use Authorization (EUA). This EUA will remain in effect (meaning this test can be used) for the duration of the COVID-19 declaration under Section 564(b)(1) of the Act, 21 U.S.C. section 360bbb-3(b)(1), unless the authorization is terminated or revoked.  Performed at [facility], [address], [place], [place] [address]   Blood Culture (routine x 2)     Status: None   Collection Time: 08/30/22  5:38 AM   Specimen: BLOOD RIGHT FOREARM  Result Value Ref Range Status   Specimen Description   Final    BLOOD RIGHT FOREARM BOTTLES DRAWN AEROBIC AND ANAEROBIC   Special Requests Blood Culture adequate volume  Final   Culture   Final     NO GROWTH 5 DAYS Performed at [facility], [address], [place], [place] [address]    Report Status 09/04/2022 FINAL  Final  Blood Culture (routine x 2)     Status: None   Collection Time: 08/30/22  5:42 AM   Specimen: Right Antecubital; Blood  Result Value Ref Range Status   Specimen Description RIGHT ANTECUBITAL BOTTLES DRAWN AEROBIC ONLY  Final   Special Requests Blood Culture adequate volume  Final   Culture   Final    NO GROWTH 5 DAYS Performed at [facility], [address], [place], [place] [address]    Report Status 09/04/2022 FINAL  Final  Respiratory (~20 pathogens) panel by PCR  Status: Abnormal   Collection Time: 08/30/22  9:24 AM   Specimen: Nasopharyngeal Swab; Respiratory  Result Value Ref Range Status   Adenovirus NOT DETECTED NOT DETECTED Final   Coronavirus 229E NOT DETECTED NOT DETECTED Final    Comment: (NOTE) The Coronavirus on the Respiratory Panel, DOES NOT test for the novel  Coronavirus (2019 nCoV)    Coronavirus HKU1 NOT DETECTED NOT DETECTED Final   Coronavirus NL63 NOT DETECTED NOT DETECTED Final   Coronavirus OC43 NOT DETECTED NOT DETECTED Final   Metapneumovirus NOT DETECTED NOT DETECTED Final   Rhinovirus / Enterovirus DETECTED (A) NOT DETECTED Final   Influenza A NOT DETECTED NOT DETECTED Final   Influenza B NOT DETECTED NOT DETECTED Final   Parainfluenza Virus 1 NOT DETECTED NOT DETECTED Final   Parainfluenza Virus 2 NOT DETECTED NOT DETECTED Final   Parainfluenza Virus 3 NOT DETECTED NOT DETECTED Final   Parainfluenza Virus 4 NOT DETECTED NOT DETECTED Final   Respiratory Syncytial Virus NOT DETECTED NOT DETECTED Final   Bordetella pertussis NOT DETECTED NOT DETECTED Final   Bordetella Parapertussis NOT DETECTED NOT DETECTED Final   Chlamydophila pneumoniae NOT DETECTED NOT DETECTED Final   Mycoplasma pneumoniae NOT DETECTED NOT DETECTED Final    Comment: Performed at [facility], [place]. [address], [place], [place] [address]  Resp  Panel by RT-PCR (Flu A&B, Covid) Anterior Nasal Swab     Status: None   Collection Time: 09/04/22  7:15 AM   Specimen: Anterior Nasal Swab  Result Value Ref Range Status   SARS Coronavirus 2 by RT PCR NEGATIVE NEGATIVE Final    Comment: (NOTE) SARS-CoV-2 target nucleic acids are NOT DETECTED.  The SARS-CoV-2 RNA is generally detectable in upper respiratory specimens during the acute phase of infection. The lowest concentration of SARS-CoV-2 viral copies this assay can detect is 138 copies/mL. A negative result does not preclude SARS-Cov-2 infection and should not be used as the sole basis for treatment or other patient management decisions. A negative result may occur with  improper specimen collection/handling, submission of specimen other than nasopharyngeal swab, presence of viral mutation(s) within the areas targeted by this assay, and inadequate number of viral copies(<138 copies/mL). A negative result must be combined with clinical observations, patient history, and epidemiological information. The expected result is Negative.  Fact Sheet for Patients:  EntrepreneurPulse.com.au  Fact Sheet for Healthcare Providers:  IncredibleEmployment.be  This test is no t yet approved or cleared by the [place] FDA and  has been authorized for detection and/or diagnosis of SARS-CoV-2 by FDA under an Emergency Use Authorization (EUA). This EUA will remain  in effect (meaning this test can be used) for the duration of the COVID-19 declaration under Section 564(b)(1) of the Act, 21 U.S.C.section 360bbb-3(b)(1), unless the authorization is terminated  or revoked sooner.       Influenza A by PCR NEGATIVE NEGATIVE Final   Influenza B by PCR NEGATIVE NEGATIVE Final    Comment: (NOTE) The Xpert Xpress SARS-CoV-2/FLU/RSV plus assay is intended as an aid in the diagnosis of influenza from Nasopharyngeal swab specimens and should not be used as a sole  basis for treatment. Nasal washings and aspirates are unacceptable for Xpert Xpress SARS-CoV-2/FLU/RSV testing.  Fact Sheet for Patients: EntrepreneurPulse.com.au  Fact Sheet for Healthcare Providers: IncredibleEmployment.be  This test is not yet approved or cleared by the [place] FDA and has been authorized for detection and/or diagnosis of SARS-CoV-2 by FDA under an Emergency Use Authorization (EUA). This  EUA will remain in effect (meaning this test can be used) for the duration of the COVID-19 declaration under Section 564(b)(1) of the Act, 21 U.S.C. section 360bbb-3(b)(1), unless the authorization is terminated or revoked.  Performed at [facility], [address], [place], [place] [address]      Labs: BNP (last 3 results) Recent Labs    08/30/22 0546 09/04/22 0727 09/05/22 0601  BNP >4,500.0* 3,987.0* 5,631.4*   Basic Metabolic Panel: Recent Labs  Lab 08/30/22 0546 08/31/22 0420 09/01/22 0503 09/04/22 0651 09/05/22 0601  NA 139 138 140 141 138  K 3.5 3.0* 3.5 4.6 3.8  CL 98 97* 102 107 106  CO2 '31 30 29 25 25  '$ GLUCOSE 105* 115* 111* 98 111*  BUN 25* 33* 39* 27* 30*  CREATININE 1.43* 1.45* 1.66* 1.35* 1.45*  CALCIUM 8.8* 8.6* 8.7* 8.7* 8.6*   Liver Function Tests: Recent Labs  Lab 08/30/22 0546 09/04/22 0651  AST 22 56*  ALT 30 49*  ALKPHOS 106 84  BILITOT 0.7 0.2*  PROT 7.8 7.3  ALBUMIN 3.2* 2.9*   No results for input(s): "LIPASE", "AMYLASE" in the last 168 hours. No results for input(s): "AMMONIA" in the last 168 hours. CBC: Recent Labs  Lab 08/30/22 0546 08/31/22 0420 09/01/22 0503 09/04/22 0651 09/05/22 0601  WBC 12.0* 7.8 6.4 7.4 6.2  NEUTROABS 9.4*  --   --  5.2  --   HGB 12.0* 10.5* 10.2* 10.2* 10.6*  HCT 37.3* 32.9* 31.9* 32.8* 32.8*  MCV 89.7 89.6 90.9 91.6 89.9  PLT 321 269 269 279 269   Cardiac Enzymes: No results for input(s): "CKTOTAL", "CKMB", "CKMBINDEX", "TROPONINI" in the last  168 hours. BNP: Invalid input(s): "POCBNP" CBG: No results for input(s): "GLUCAP" in the last 168 hours. D-Dimer No results for input(s): "DDIMER" in the last 72 hours. Hgb A1c No results for input(s): "HGBA1C" in the last 72 hours. Lipid Profile No results for input(s): "CHOL", "HDL", "LDLCALC", "TRIG", "CHOLHDL", "LDLDIRECT" in the last 72 hours. Thyroid function studies No results for input(s): "TSH", "T4TOTAL", "T3FREE", "THYROIDAB" in the last 72 hours.  Invalid input(s): "FREET3" Anemia work up No results for input(s): "VITAMINB12", "FOLATE", "FERRITIN", "TIBC", "IRON", "RETICCTPCT" in the last 72 hours. Urinalysis    Component Value Date/Time   COLORURINE YELLOW 08/30/2022 0625   APPEARANCEUR CLEAR 08/30/2022 0625   LABSPEC 1.016 08/30/2022 0625   PHURINE 5.0 08/30/2022 0625   GLUCOSEU NEGATIVE 08/30/2022 0625   HGBUR SMALL (A) 08/30/2022 0625   BILIRUBINUR NEGATIVE 08/30/2022 0625   KETONESUR NEGATIVE 08/30/2022 0625   PROTEINUR 100 (A) 08/30/2022 0625   NITRITE NEGATIVE 08/30/2022 0625   LEUKOCYTESUR NEGATIVE 08/30/2022 0625   Sepsis Labs Recent Labs  Lab 08/31/22 0420 09/01/22 0503 09/04/22 0651 09/05/22 0601  WBC 7.8 6.4 7.4 6.2   Microbiology Recent Results (from the past 240 hour(s))  Urine Culture     Status: Abnormal   Collection Time: 08/30/22  5:11 AM   Specimen: In/Out Cath Urine  Result Value Ref Range Status   Specimen Description   Final    IN/OUT CATH URINE Performed at [facility], [address], [place], [place] [address]    Special Requests   Final    NONE Performed at [facility], [address] [place],  [address]    Culture MULTIPLE SPECIES PRESENT, SUGGEST RECOLLECTION (A)  Final   Report Status 09/01/2022 FINAL  Final  Resp Panel by RT-PCR (Flu A&B, Covid) Anterior Nasal Swab  Status: None   Collection Time: 08/30/22  5:12 AM   Specimen: Anterior Nasal Swab  Result Value Ref Range Status   SARS Coronavirus 2 by RT  PCR NEGATIVE NEGATIVE Final    Comment: (NOTE) SARS-CoV-2 target nucleic acids are NOT DETECTED.  The SARS-CoV-2 RNA is generally detectable in upper respiratory specimens during the acute phase of infection. The lowest concentration of SARS-CoV-2 viral copies this assay can detect is 138 copies/mL. A negative result does not preclude SARS-Cov-2 infection and should not be used as the sole basis for treatment or other patient management decisions. A negative result may occur with  improper specimen collection/handling, submission of specimen other than nasopharyngeal swab, presence of viral mutation(s) within the areas targeted by this assay, and inadequate number of viral copies(<138 copies/mL). A negative result must be combined with clinical observations, patient history, and epidemiological information. The expected result is Negative.  Fact Sheet for Patients:  EntrepreneurPulse.com.au  Fact Sheet for Healthcare Providers:  IncredibleEmployment.be  This test is no t yet approved or cleared by the [place] FDA and  has been authorized for detection and/or diagnosis of SARS-CoV-2 by FDA under an Emergency Use Authorization (EUA). This EUA will remain  in effect (meaning this test can be used) for the duration of the COVID-19 declaration under Section 564(b)(1) of the Act, 21 U.S.C.section 360bbb-3(b)(1), unless the authorization is terminated  or revoked sooner.       Influenza A by PCR NEGATIVE NEGATIVE Final   Influenza B by PCR NEGATIVE NEGATIVE Final    Comment: (NOTE) The Xpert Xpress SARS-CoV-2/FLU/RSV plus assay is intended as an aid in the diagnosis of influenza from Nasopharyngeal swab specimens and should not be used as a sole basis for treatment. Nasal washings and aspirates are unacceptable for Xpert Xpress SARS-CoV-2/FLU/RSV testing.  Fact Sheet for Patients: EntrepreneurPulse.com.au  Fact Sheet for  Healthcare Providers: IncredibleEmployment.be  This test is not yet approved or cleared by the [place] FDA and has been authorized for detection and/or diagnosis of SARS-CoV-2 by FDA under an Emergency Use Authorization (EUA). This EUA will remain in effect (meaning this test can be used) for the duration of the COVID-19 declaration under Section 564(b)(1) of the Act, 21 U.S.C. section 360bbb-3(b)(1), unless the authorization is terminated or revoked.  Performed at [facility], [address] [place], [place] [address]   Blood Culture (routine x 2)     Status: None   Collection Time: 08/30/22  5:38 AM   Specimen: BLOOD RIGHT FOREARM  Result Value Ref Range Status   Specimen Description   Final    BLOOD RIGHT FOREARM BOTTLES DRAWN AEROBIC AND ANAEROBIC   Special Requests Blood Culture adequate volume  Final   Culture   Final    NO GROWTH 5 DAYS Performed at [facility], [address], [place], [place] [address]    Report Status 09/04/2022 FINAL  Final  Blood Culture (routine x 2)     Status: None   Collection Time: 08/30/22  5:42 AM   Specimen: Right Antecubital; Blood  Result Value Ref Range Status   Specimen Description RIGHT ANTECUBITAL BOTTLES DRAWN AEROBIC ONLY  Final   Special Requests Blood Culture adequate volume  Final   Culture   Final    NO GROWTH 5 DAYS Performed at [facility], [address], [place], [place] [address]    Report Status 09/04/2022 FINAL  Final  Respiratory (~20 pathogens) panel by PCR     Status: Abnormal  Collection Time: 08/30/22  9:24 AM   Specimen: Nasopharyngeal Swab; Respiratory  Result Value Ref Range Status   Adenovirus NOT DETECTED NOT DETECTED Final   Coronavirus 229E NOT DETECTED NOT DETECTED Final    Comment: (NOTE) The Coronavirus on the Respiratory Panel, DOES NOT test for the novel  Coronavirus (2019 nCoV)    Coronavirus HKU1 NOT DETECTED NOT DETECTED Final   Coronavirus NL63 NOT DETECTED NOT  DETECTED Final   Coronavirus OC43 NOT DETECTED NOT DETECTED Final   Metapneumovirus NOT DETECTED NOT DETECTED Final   Rhinovirus / Enterovirus DETECTED (A) NOT DETECTED Final   Influenza A NOT DETECTED NOT DETECTED Final   Influenza B NOT DETECTED NOT DETECTED Final   Parainfluenza Virus 1 NOT DETECTED NOT DETECTED Final   Parainfluenza Virus 2 NOT DETECTED NOT DETECTED Final   Parainfluenza Virus 3 NOT DETECTED NOT DETECTED Final   Parainfluenza Virus 4 NOT DETECTED NOT DETECTED Final   Respiratory Syncytial Virus NOT DETECTED NOT DETECTED Final   Bordetella pertussis NOT DETECTED NOT DETECTED Final   Bordetella Parapertussis NOT DETECTED NOT DETECTED Final   Chlamydophila pneumoniae NOT DETECTED NOT DETECTED Final   Mycoplasma pneumoniae NOT DETECTED NOT DETECTED Final    Comment: Performed at [facility], [address], [place], [place] [address]  Resp Panel by RT-PCR (Flu A&B, Covid) Anterior Nasal Swab     Status: None   Collection Time: 09/04/22  7:15 AM   Specimen: Anterior Nasal Swab  Result Value Ref Range Status   SARS Coronavirus 2 by RT PCR NEGATIVE NEGATIVE Final    Comment: (NOTE) SARS-CoV-2 target nucleic acids are NOT DETECTED.  The SARS-CoV-2 RNA is generally detectable in upper respiratory specimens during the acute phase of infection. The lowest concentration of SARS-CoV-2 viral copies this assay can detect is 138 copies/mL. A negative result does not preclude SARS-Cov-2 infection and should not be used as the sole basis for treatment or other patient management decisions. A negative result may occur with  improper specimen collection/handling, submission of specimen other than nasopharyngeal swab, presence of viral mutation(s) within the areas targeted by this assay, and inadequate number of viral copies(<138 copies/mL). A negative result must be combined with clinical observations, patient history, and epidemiological information. The expected result is  Negative.  Fact Sheet for Patients:  EntrepreneurPulse.com.au  Fact Sheet for Healthcare Providers:  IncredibleEmployment.be  This test is no t yet approved or cleared by the [place] FDA and  has been authorized for detection and/or diagnosis of SARS-CoV-2 by FDA under an Emergency Use Authorization (EUA). This EUA will remain  in effect (meaning this test can be used) for the duration of the COVID-19 declaration under Section 564(b)(1) of the Act, 21 U.S.C.section 360bbb-3(b)(1), unless the authorization is terminated  or revoked sooner.       Influenza A by PCR NEGATIVE NEGATIVE Final   Influenza B by PCR NEGATIVE NEGATIVE Final    Comment: (NOTE) The Xpert Xpress SARS-CoV-2/FLU/RSV plus assay is intended as an aid in the diagnosis of influenza from Nasopharyngeal swab specimens and should not be used as a sole basis for treatment. Nasal washings and aspirates are unacceptable for Xpert Xpress SARS-CoV-2/FLU/RSV testing.  Fact Sheet for Patients: EntrepreneurPulse.com.au  Fact Sheet for Healthcare Providers: IncredibleEmployment.be  This test is not yet approved or cleared by the [place] FDA and has been authorized for detection and/or diagnosis of SARS-CoV-2 by FDA under an Emergency Use Authorization (EUA). This EUA will remain in  effect (meaning this test can be used) for the duration of the COVID-19 declaration under Section 564(b)(1) of the Act, 21 U.S.C. section 360bbb-3(b)(1), unless the authorization is terminated or revoked.  Performed at [facility], [address] [place], [place] [address]      Time coordinating discharge: 3mns  SIGNED:   JKathie Dike MD  [facility] 09/05/2022, 8:28 PM   If 7PM-7AM, please contact night-coverage [email or web address]

## 2022-09-05 NOTE — Progress Notes (Signed)
   09/05/22 1600  ReDS Vest / Clip  Station Marker D  Ruler Value 38  ReDS Value Range 36 - 40  ReDS Actual Value 28

## 2022-09-05 NOTE — Progress Notes (Signed)
Ambulated patient for to detect SOB with ambulation, none detected after ambulating 150 ft in hallway. Discharge instructions given to patient, patient verbally expressed understanding of instructions given. All belongings given to patient patient in stable condition at time of discharge, escorted by staff via w/c

## 2022-09-08 ENCOUNTER — Ambulatory Visit (INDEPENDENT_AMBULATORY_CARE_PROVIDER_SITE_OTHER): Payer: Medicaid Other | Admitting: Family

## 2022-09-08 ENCOUNTER — Encounter: Payer: Self-pay | Admitting: Family

## 2022-09-08 ENCOUNTER — Other Ambulatory Visit: Payer: Self-pay

## 2022-09-08 ENCOUNTER — Inpatient Hospital Stay: Payer: Medicaid Other

## 2022-09-08 VITALS — BP 95/60 | HR 78 | Temp 98.4°F | Resp 16 | Ht 73.0 in | Wt 162.8 lb

## 2022-09-08 DIAGNOSIS — I5042 Chronic combined systolic (congestive) and diastolic (congestive) heart failure: Secondary | ICD-10-CM

## 2022-09-08 DIAGNOSIS — I10 Essential (primary) hypertension: Secondary | ICD-10-CM | POA: Diagnosis not present

## 2022-09-08 DIAGNOSIS — F1410 Cocaine abuse, uncomplicated: Secondary | ICD-10-CM | POA: Diagnosis not present

## 2022-09-08 DIAGNOSIS — Z87891 Personal history of nicotine dependence: Secondary | ICD-10-CM

## 2022-09-08 DIAGNOSIS — I110 Hypertensive heart disease with heart failure: Secondary | ICD-10-CM

## 2022-09-08 DIAGNOSIS — B20 Human immunodeficiency virus [HIV] disease: Secondary | ICD-10-CM

## 2022-09-08 DIAGNOSIS — Z0000 Encounter for general adult medical examination without abnormal findings: Secondary | ICD-10-CM | POA: Diagnosis not present

## 2022-09-08 MED ORDER — LOSARTAN POTASSIUM 50 MG PO TABS: 50.0000 mg | ORAL_TABLET | Freq: Every day | ORAL | 1 refills | Status: DC

## 2022-09-08 MED ORDER — BIKTARVY 50-200-25 MG PO TABS: 1.0000 | ORAL_TABLET | Freq: Every day | ORAL | 5 refills | Status: DC

## 2022-09-08 NOTE — Progress Notes (Signed)
Brief Narrative   Patient ID: Richard Cox, male    DOB: 11/22/1980, 41 y.o.   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 No history of opportunistic infection or acute retroviral syndrome. Initial blood work for entry to care with CD4 of 188 and viral load of 65,200 (CDC Stage 3). Genotype was wild with no significant resistance. Sole medication regimen of Biktarvy  Subjective:    Chief Complaint  Patient presents with   Follow-up    B20 - c/o right ear/jaw pain x 3 weeks. Eating or drinking cold fluids pain worsens.     HPI:  Richard Cox is a 41 y.o. 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 Viral load was undetectable with CD4 count of 271. Most recent lab work completed during recent hospitalization for exacerbation of heart failure on 08/31/22 with viral load being undetectable and CD4 count 303. Here today for follow up.  Mr. Hollings has been doing okay since leaving the hospital. Has right sided jaw/ear pain that has been going on for about 3 weeks and is exacerbated by eating or drinking cold fluids. Denies changes in hearing or ear pain, jaw sounds/sensations. Has follow up with Cardiology on 09/28/22. Continues to have occasional cough. Taking his Biktarvy regularly and may have missed several days. No problems obtaining medication and no adverse side effects. Covered through Medicaid. Condoms and STD testing offered. Declines vaccines. Due for routine dental care.    Denies fevers, chills, night sweats, headaches, changes in vision, neck pain/stiffness, nausea, diarrhea, vomiting, lesions or rashes.   Allergies  Allergen Reactions   Neosporin [Neomycin-Bacitracin Zn-Polymyx] Rash      Outpatient Medications Prior to Visit  Medication Sig Dispense Refill   acetaminophen (TYLENOL) 500 MG tablet Take 1,000 mg by mouth every 6 (six) hours as  needed for moderate pain or headache.     aspirin 81 MG chewable tablet Chew 1 tablet (81 mg total) by mouth daily. 30 tablet 0   carvedilol (COREG) 25 MG tablet Take 1 tablet (25 mg total) by mouth 2 (two) times daily with a meal. 60 tablet 1   furosemide (LASIX) 80 MG tablet Take 1 tablet (80 mg total) by mouth daily. 30 tablet 1   hydrALAZINE (APRESOLINE) 100 MG tablet Take 0.5 tablets (50 mg total) by mouth 3 (three) times daily. 90 tablet 1   BIKTARVY 50-200-25 MG TABS tablet TAKE 1 TABLET BY MOUTH DAILY 30 tablet 0   azithromycin (ZITHROMAX) 500 MG tablet Take 1 tablet (500 mg total) by mouth daily. (Patient not taking: Reported on 09/08/2022) 4 tablet 0   cefdinir (OMNICEF) 300 MG capsule Take 1 capsule (300 mg total) by mouth every 12 (twelve) hours. (Patient not taking: Reported on 09/08/2022) 10 capsule 0   losartan (COZAAR) 50 MG tablet Take 1 tablet (50 mg total) by mouth daily. (Patient not taking: Reported on 09/08/2022) 30 tablet 1   Facility-Administered Medications Prior to Visit  Medication Dose Route Frequency Provider Last Rate Last Admin   0.9 %  sodium chloride infusion   Intravenous Continuous Derek Jack, MD       0.9 %  sodium chloride infusion   Intravenous Continuous Derek Jack, MD   Stopped at 09/26/20 1124   0.9 %  sodium chloride infusion   Intravenous Continuous Derek Jack, MD 500 mL/hr  at 11/04/20 0935 Bolus from Bag at 11/04/20 0935   sodium chloride flush (NS) 0.9 % injection 10 mL  10 mL Intravenous PRN Derek Jack, MD   10 mL at 10/15/20 1035   sodium chloride flush (NS) 0.9 % injection 10 mL  10 mL Intravenous PRN Derek Jack, MD   10 mL at 11/07/20 1046     Past Medical History:  Diagnosis Date   Acute respiratory failure with hypoxia ([place]) 08/24/2021   CHF (congestive heart failure) ([place])    Endotracheally intubated 08/24/2021   Essential hypertension    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  Eyes:  Negative for visual disturbance.  Respiratory:  Negative for cough, chest tightness, shortness of breath and wheezing.   Cardiovascular:  Negative for chest pain and leg swelling.  Gastrointestinal:  Negative for abdominal pain, constipation, diarrhea, nausea and vomiting.  Genitourinary:  Negative for dysuria, flank pain, frequency, genital sores, hematuria and urgency.  Skin:  Negative for rash.  Allergic/Immunologic: Negative for immunocompromised state.  Neurological:  Negative for dizziness and headaches.      Objective:    BP 95/60   Pulse 78   Temp 98.4 F (36.9 C) (Oral)   Resp 16   Ht '6\' 1"'$  (1.854 m)   Wt 162 lb 12.8 oz (73.8 kg)   SpO2 98%   BMI 21.48 kg/m  Nursing note and vital signs reviewed.  Physical Exam Constitutional:      General: He is not in acute distress.    Appearance: He is well-developed.  Eyes:     Conjunctiva/sclera: Conjunctivae normal.  Cardiovascular:     Rate and Rhythm: Normal rate and regular rhythm.     Heart sounds: Normal heart sounds. No murmur heard.    No friction rub. No gallop.  Pulmonary:     Effort: Pulmonary effort is normal. No respiratory distress.     Breath sounds: Normal breath sounds. No wheezing or rales.  Chest:     Chest wall: No tenderness.  Abdominal:     General: Bowel sounds are normal.     Palpations: Abdomen is soft.     Tenderness: There is no abdominal tenderness.  Musculoskeletal:     Cervical back: Neck supple.  Lymphadenopathy:     Cervical: No cervical adenopathy.  Skin:    General: Skin is warm and dry.     Findings: No rash.  Neurological:     Mental  Status: He is alert and oriented to person, place, and time.  Psychiatric:        Behavior: Behavior normal.        Thought Content: Thought content normal.        Judgment: Judgment normal.         09/08/2022    9:32 AM 10/17/2021    8:47 AM 10/06/2021    8:32 AM 09/10/2021    1:21 PM 01/27/2021    9:13 AM  Depression screen PHQ 2/9  Decreased Interest 0 0 0 0 0  Down, Depressed, Hopeless 0 0 2 0 0  PHQ - 2 Score 0  0 2 0 0  Altered sleeping  0 3    Tired, decreased energy  0 3    Change in appetite  0 3    Feeling bad or failure about yourself   0 0    Trouble concentrating  0 0    Moving slowly or fidgety/restless  0 0    Suicidal thoughts  0 0    PHQ-9 Score  0 11    Difficult doing work/chores   Somewhat difficult         Assessment & Plan:    Patient Active Problem List   Diagnosis Date Noted   CHF exacerbation ([place]) 09/04/2022   Cocaine abuse ([place]) 09/04/2022   Sepsis due to undetermined organism ([place]) 08/30/2022   CAP (community acquired pneumonia) 08/30/2022   Hypertensive crisis 08/13/2022   Healthcare maintenance 10/17/2021   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   Acute bronchitis 10/06/2021   Hospital discharge follow-up 09/10/2021   Acute respiratory failure with hypoxia ([place]) 08/24/2021   Elevated troponin 08/24/2021   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   Intra-abdominal lymphadenopathy 07/30/2020   Generalized lymphadenopathy 07/25/2020   Varicella zoster 01/04/2020   AIDS (acquired immune deficiency syndrome) ([place]) 10/10/2019   Acute on chronic combined systolic and diastolic CHF (congestive heart failure) ([place]) 09/18/2019   Richard Cox (human immunodeficiency virus infection) ([place]) 09/18/2019   Substance abuse ([place]) 09/18/2019   Alcohol abuse 09/18/2019   Tobacco abuse 09/18/2019   HTN (hypertension) 03/10/2019   Congestive heart failure ([place]) 03/10/2019   Noncompliance with medication regimen 03/10/2019     Problem List Items Addressed This Visit        Cardiovascular and Mediastinum   HTN (hypertension)   Relevant Medications   losartan (COZAAR) 50 MG tablet   Congestive heart failure (HCC)    Some shortness of breath although appears overall improved since leaving the hospital. Discussed importance of continued follow up with cardiology.       Relevant Medications   losartan (COZAAR) 50 MG tablet     Other   AIDS (acquired immune deficiency syndrome) [facility])    Mr. Aloisi continues to have well controlled virus with good adherence and tolerance to [employer]. Reviewed lab work and discussed plan of care. Discussed long acting injectable medications including risks, benefits, and logistics. Stay with [facility] for now. Plan for follow up in 6 months or sooner if needed with lab work on the same day.       Relevant Medications   bictegravir-emtricitabine-tenofovir AF (BIKTARVY) 50-200-25 MG TABS tablet   Healthcare maintenance    Discussed importance of safe sexual practice and condom use. Condoms and STD testing offered.  Declines vaccines Dental appt info provided in AVS.       Cocaine abuse ([place])    Last use about 1 week ago likely resulting in/contributing to his heart failure exacerbation. Discussed importance of cessation and the continued risks associated with use.         I have discontinued Jamire Schnarr's azithromycin and cefdinir. I have also changed his Biktarvy. Additionally, I am having him maintain his acetaminophen, aspirin, carvedilol, furosemide, hydrALAZINE, and losartan.   Meds ordered this encounter  Medications   losartan (COZAAR) 50 MG tablet    Sig: Take 1 tablet (50 mg total) by mouth daily.    Dispense:  30 tablet    Refill:  1    Order Specific Question:   Supervising Provider    Answer:   Carlyle Basques [phone]  bictegravir-emtricitabine-tenofovir AF (BIKTARVY) 50-200-25 MG TABS tablet    Sig: Take 1 tablet by mouth daily.    Dispense:  30 tablet    Refill:  5    Order Specific Question:    Supervising Provider    Answer:   Carlyle Basques [[id number]]     Follow-up: Return in about 6 months (around 03/09/2023), or if symptoms worsen or fail to improve.   Terri Piedra, MSN, FNP-C Nurse Practitioner [facility] [place] number: [phone]

## 2022-09-08 NOTE — Assessment & Plan Note (Signed)
   Discussed importance of safe sexual practice and condom use. Condoms and STD testing offered.   Declines vaccines  Dental appt info provided in AVS.

## 2022-09-08 NOTE — Assessment & Plan Note (Signed)
Last use about 1 week ago likely resulting in/contributing to his heart failure exacerbation. Discussed importance of cessation and the continued risks associated with use.

## 2022-09-08 NOTE — Patient Instructions (Addendum)
Nice to see you.  We will check your lab work today.  Continue to take your medication daily as prescribed.  Refills have been sent to the pharmacy.  Please call [facility] ([facility]) to schedule/follow up on your dental care at [phone]  Plan for follow up in 6 months or sooner if needed with lab work on the same day.   Have a great day and stay safe!  

## 2022-09-08 NOTE — Assessment & Plan Note (Signed)
Mr. Rann continues to have well controlled virus with good adherence and tolerance to Biktarvy. Reviewed lab work and discussed plan of care. Discussed long acting injectable medications including risks, benefits, and logistics. Stay with [facility] for now. Plan for follow up in 6 months or sooner if needed with lab work on the same day.

## 2022-09-08 NOTE — Assessment & Plan Note (Signed)
Some shortness of breath although appears overall improved since leaving the hospital. Discussed importance of continued follow up with cardiology.

## 2022-09-09 ENCOUNTER — Inpatient Hospital Stay: Payer: Medicaid Other | Attending: Hematology

## 2022-09-10 ENCOUNTER — Ambulatory Visit: Payer: Medicaid Other | Admitting: Family

## 2022-09-15 ENCOUNTER — Ambulatory Visit: Payer: Medicaid Other | Admitting: Hematology

## 2022-09-16 NOTE — H&P (Addendum)
History and Physical    Richard Cox JOA:[id number] DOB: 1981/05/21 DOA: 09/04/2022  PCP: Pcp, No  Patient coming from: Home  I have personally briefly reviewed patient's old medical records in [place]  Chief Complaint: Shortness of breath  HPI: Richard Cox is a 41 y.o. 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  Recently discharged from the hospital on 10/24 after being treated for pneumonia.  Reports that he was doing fairly well on discharge.  He had sudden onset of shortness of breath last night while he was sleeping that woke him up.  He was sleeping in a recliner for the last several months.  Denies any chest pain.  Since shortness of breath symptoms not improve, he came to the ER for evaluation.  Reports compliance with his medications, but he does report running out of Lasix approximately 2 days ago.  He does admit to dietary indiscretions and does eat higher salt containing foods.  Says he cannot afford other options due to financial limitations.  Does admit to using cocaine approximately a week ago.  ED Course: Noted to be short of breath on arrival, received Lasix and neb treatments.  Ultimately required BiPAP due to increased work of breathing.  Noted to be severely hypertensive on arrival.  X-ray imaging indicates pulmonary edema.  Troponin mildly elevated.  The patient did improve with Lasix.  Also started on nitroglycerin infusion.  He was able to come off BiPAP in the emergency room and was placed on nasal cannula oxygen.  Review of Systems: As per HPI otherwise 10 point review of systems negative.    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 His smoking use included cigarettes. He smoked an average of .25 packs per day. He has never used smokeless tobacco. He reports that he does not currently use alcohol. He reports current drug use. Drugs: Marijuana and Cocaine.  Allergies  Allergen Reactions   Neosporin [Neomycin-Bacitracin Zn-Polymyx] Rash    Family History  Problem Relation Age of Onset   Diabetes Mellitus II Mother    Cancer Mother        multiple myeloma   Cancer Father        multiple myeloma   Congestive Heart Failure Brother    Heart Problems Brother    Lupus Brother      Prior to Admission medications   Medication Sig Start Date End Date Taking? Authorizing Provider  acetaminophen (TYLENOL) 500 MG tablet Take 1,000 mg by mouth every 6 (six) hours as needed for moderate pain or headache.   Yes [provider]  aspirin 81 MG chewable tablet Chew 1 tablet (81 mg total) by mouth daily. 08/28/21  Yes Vann, Jessica U, DO  azithromycin (ZITHROMAX) 500 MG tablet Take 1 tablet (500 mg total) by mouth daily. 09/01/22  Yes TatShanon Brow,  MD  BIKTARVY 50-200-25 MG TABS tablet TAKE 1 TABLET BY MOUTH DAILY 08/28/22  Yes Golden Circle, FNP  carvedilol (COREG) 25 MG tablet Take 1 tablet (25 mg total) by mouth 2 (two) times daily with a meal. 09/01/22  Yes Tat, Shanon Brow, MD  cefdinir (OMNICEF) 300 MG capsule Take 1 capsule (300 mg total) by mouth every 12 (twelve) hours. 09/01/22  Yes Tat, Shanon Brow, MD  furosemide (LASIX) 40 MG tablet Take 1 tablet (40 mg total) by mouth daily as needed for fluid. 08/01/22 11/29/22 Yes Davonna Belling, MD  hydrALAZINE (APRESOLINE) 50 MG tablet Take 1 tablet (50 mg total) by mouth 3  (three) times daily. 08/01/22  Yes Davonna Belling, MD  losartan (COZAAR) 50 MG tablet Take 1 tablet (50 mg total) by mouth daily. 05/31/22  Yes Sherwood Gambler, MD    Physical Exam: Vitals:   09/05/22 0432 09/05/22 0437 09/05/22 0817 09/05/22 1332  BP: (!) 182/128  (!) 163/108 (!) 145/102  Pulse: 84  84 78  Resp: (!) 22     Temp: 98.1 F (36.7 C)  98.3 F (36.8 C) 98.3 F (36.8 C)  TempSrc: Oral  Oral   SpO2: 97%  95% 93%  Weight:  74.1 kg    Height:        Constitutional: NAD, calm, comfortable Eyes: PERRL, lids and conjunctivae normal ENMT: Mucous membranes are moist. Posterior pharynx clear of any exudate or lesions.Normal dentition.  Neck: normal, supple, no masses, no thyromegaly Respiratory: Bilateral crackles. Normal respiratory effort. No accessory muscle use.  Cardiovascular: Regular rate and rhythm, no murmurs / rubs / gallops.  1+ extremity edema. 2+ pedal pulses. No carotid bruits.  Abdomen: no tenderness, no masses palpated. No hepatosplenomegaly. Bowel sounds positive.  Musculoskeletal: no clubbing / cyanosis. No joint deformity upper and lower extremities. Good ROM, no contractures. Normal muscle tone.  Skin: no rashes, lesions, ulcers. No induration Neurologic: CN 2-12 grossly intact. Sensation intact, DTR normal. Strength 5/5 in all 4.  Psychiatric: Normal judgment and insight. Alert and oriented x 3. Normal mood.    Labs on Admission: I have personally reviewed following labs and imaging studies  CBC: No results for input(s): "WBC", "NEUTROABS", "HGB", "HCT", "MCV", "PLT" in the last 168 hours.  Basic Metabolic Panel: No results for input(s): "NA", "K", "CL", "CO2", "GLUCOSE", "BUN", "CREATININE", "CALCIUM", "MG", "PHOS" in the last 168 hours.  GFR: Estimated Creatinine Clearance: 71.7 mL/min (A) (by C-G formula based on SCr of 1.45 mg/dL (H)). Liver Function Tests: No results for input(s): "AST", "ALT", "ALKPHOS", "BILITOT", "PROT", "ALBUMIN" in the  last 168 hours.  No results for input(s): "LIPASE", "AMYLASE" in the last 168 hours. No results for input(s): "AMMONIA" in the last 168 hours. Coagulation Profile: No results for input(s): "INR", "PROTIME" in the last 168 hours.  Cardiac Enzymes: No results for input(s): "CKTOTAL", "CKMB", "CKMBINDEX", "TROPONINI" in the last 168 hours. BNP (last 3 results) No results for input(s): "PROBNP" in the last 8760 hours. HbA1C: No results for input(s): "HGBA1C" in the last 72 hours. CBG: No results for input(s): "GLUCAP" in the last 168 hours. Lipid Profile: No results for input(s): "CHOL", "HDL", "LDLCALC", "TRIG", "CHOLHDL", "LDLDIRECT" in the last 72 hours. Thyroid Function Tests: No results for input(s): "TSH", "T4TOTAL", "FREET4", "T3FREE", "THYROIDAB" in the last 72 hours. Anemia Panel: No results for input(s): "VITAMINB12", "FOLATE", "FERRITIN", "TIBC", "IRON", "RETICCTPCT" in the last 72 hours. Urine analysis:    Component Value Date/Time   COLORURINE YELLOW 08/30/2022 2297  APPEARANCEUR CLEAR 08/30/2022 0625   LABSPEC 1.016 08/30/2022 0625   PHURINE 5.0 08/30/2022 0625   GLUCOSEU NEGATIVE 08/30/2022 0625   HGBUR SMALL (A) 08/30/2022 0625   BILIRUBINUR NEGATIVE 08/30/2022 0625   KETONESUR NEGATIVE 08/30/2022 0625   PROTEINUR 100 (A) 08/30/2022 0625   NITRITE NEGATIVE 08/30/2022 0625   LEUKOCYTESUR NEGATIVE 08/30/2022 0625    Radiological Exams on Admission: No results found.  EKG: Independently reviewed.  Sinus rhythm, LVH with strain pattern  Assessment/Plan Principal Problem:   CHF exacerbation (HCC) Active Problems:   HTN (hypertension)   Acute on chronic combined systolic and diastolic CHF (congestive heart failure) (HCC)   HIV (human immunodeficiency virus infection) (HCC)   High grade B-cell lymphoma (HCC)   Acute respiratory failure with hypoxia (HCC)   Hypertensive crisis   Cocaine abuse ([place])     Acute on chronic combined CHF -Ejection fraction of  30 to 35% with grade 3 diastolic dysfunction -Reports being compliant with Lasix, he was taking 80 mg at home daily.  Reports that he had not taken any Lasix in the last 2 days since he ran out -He normally sleeps at home in a recliner for the last several months -Suspect that acute decompensation related to severe hypertension -Started on IV Lasix with good urine output -Appreciate cardiology assistance -Wean off nitroglycerin infusion -We will reassess need for diuretics tomorrow  Hypertensive emergency -Noted to have acute decompensation of CHF with severe hypertension on admission -Placed on nitroglycerin infusion -Resume home medications including Coreg, hydralazine and losartan -Overall blood pressures have improved and we are weaning off nitroglycerin -Hypertension likely precipitated by recent cocaine use  Acute respiratory failure with hypoxia -Noted to be in severe respiratory distress on admission requiring BiPAP -He has since been weaned off of BiPAP and is on nasal cannula  Hypertrophic nonobstructive cardiomyopathy -No obstructive gradient by prior imaging -Poor ICD candidate given ongoing substance abuse  Elevated troponin -Suspect demand ischemia in the setting of severe hypertension and heart failure -No complaints of chest pain  HIV -Continue Biktarvy -Recently checked from 08/31/2022 CD4 count 303  Recent admission for pneumonia, discharged 3 days ago -We will complete course of antibiotics that was prescribed on last discharge  Polysubstance abuse -Urine drug screen positive for cocaine -Counseled on the importance of abstinence from illicit drug use   B-cell lymphoma -Does not appear to be on any active treatment at this time -Continue follow-up with oncology  DVT prophylaxis: Heparin Code Status: Full code Family Communication: Discussed with patient Disposition Plan: Discharge home once volume status has improved Consults called:  Cardiology Admission status: Inpatient, telemetry  Kathie Dike MD [facility]   If 7PM-7AM, please contact night-coverage [email or web address]   09/16/2022, 7:43 PM

## 2022-09-26 ENCOUNTER — Other Ambulatory Visit: Payer: Self-pay | Admitting: Family

## 2022-09-26 DIAGNOSIS — B20 Human immunodeficiency virus [HIV] disease: Secondary | ICD-10-CM

## 2022-09-27 NOTE — Progress Notes (Deleted)
Cardiology Office Note:    Date:  09/27/2022   ID:  Richard Cox, DOB [birth year], MRN [id number]  PCP:  Kathyrn Lass   [place] Providers Cardiologist:  Rozann Lesches, MD { Click to update primary MD,subspecialty MD or APP then REFRESH:1}    Referring MD: No ref. provider found   No chief complaint on file. ***  History of Present Illness:    Richard Cox is a 41 y.o. male with a hx of ***  HTN CHF  HIV/AIDS Lymphoma Polysubstance abuse Hx of medication noncompliance  Hospitalized in 2020 d/t hypertensive emergency due to medication noncompliance. Echo at that time revealed EF unchanged at 40-45%, severe LVH noted.  ESR was 92 and CRP was 9.6, small to moderate circumferential pericardial effusion without evidence of cardiac tamponade, there was concern for possible pericarditis as well. There was concern in the past about possible hypertrophic cardiomyopathy versus hypertensive cardiomyopathy or infiltrative disease including amyloidosis.  There was a previous plan to consider outpatient cardiac MRI and possibly PYP scan with referral to the heart failure clinic however patient did not follow-up.  Last seen in the cardiology clinic on October 12, 2019 and was seen by Levell July, NP for hospitalization follow-up.  His polysubstance abuse at that time included alcohol and tobacco abuse, but he did not have any recent use of cocaine.  He stated he was feeling much better, blood pressure was elevated at that visit due to medication noncompliance.   Has hx of multiple and recurring admissions in pattern of volume overload and severe HTN in setting of cocaine abuse and presenting with symptoms of volume overload. This year, he has had multiple ED visits.    Most recent ED visit was on 09/04/2022 with chief complaint of shortness of breath.  He was discharged from the hospital on 10/24 after being treated for pneumonia.  He was doing fairly well when he woke up with sudden onset of  shortness of breath, had been sleeping in recliner for last several months.  Denied any chest pain.  Stated he ran out of Lasix 2 days ago, and admitted to dietary indiscretions with high salt intake, admitted to using cocaine a week prior. BNP 3,987.  In the ED he received Lasix and nebulizer treatments.  COVID and flu negative.  Respiratory panel negative.  Blood cultures negative.  Did require BiPAP therapy to improve breathing, chest x-ray revealed pulmonary edema.  Was severely hypertensive on arrival.  Troponin was mildly elevated due to demand ischemia in the setting of severe hypertension, hypoxia, heart failure, and hypertrophic cardiomyopathy. EKG did not show any acute changes. Was started on nitroglycerin infusion.  Patient did improve with Lasix and was able to come off BiPAP in ER and placed on nasal cannula.  Echo in October 2023 revealed severe LVH that was more prominent in the basal septum, no LVOT gradients, grade 3 DD, EF 30 to 35%, severe LAE.  Cardiac MRI the year before revealed hypertrophic cardiomyopathy without obstruction, 31 mm mass of septal hypertrophy in the mid left ventricular septum with EF 30 to 35%.  Dr. Carlyle Dolly who saw patient in the hospital recommended that for outpatient cardiology follow-up to consider starting him on Entresto, Aldactone, and possibly SGLT2 inhibitor.  Patient improved and was able to be discharged on room air on 09/05/2022.   Today he presents for hospital follow-up.  He states.  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     Allergies:   Neosporin [neomycin-bacitracin zn-polymyx]   Social History   Socioeconomic History   Marital status: Single    Spouse name: Not on file   Number of children: 0   Years of education: 13   Highest education level: Not on file  Occupational History   Not on file  Tobacco Use   Smoking status: Former    Packs/day: 0.25    Types: Cigarettes   Smokeless tobacco: Never  Vaping Use   Vaping Use: Never used  Substance and Sexual Activity   Alcohol use: Not Currently   Drug use: Yes    Types: Marijuana, Cocaine   Sexual activity: Not Currently    Comment: given condoms  Other Topics Concern   Not on file  Social History Narrative   Not on file   Social Determinants of Health   Financial Resource Strain: Low Risk  (09/10/2020)   Overall Financial Resource Strain (CARDIA)    Difficulty of Paying Living Expenses: Not very hard  Food Insecurity: No Food Insecurity (09/05/2022)   Hunger Vital Sign    Worried About Running Out of Food in the Last Year: Never true    Ran Out of Food in the Last Year: Never true  Transportation Needs: No Transportation Needs (09/05/2022)   PRAPARE - Hydrologist (Medical): No    Lack of Transportation (Non-Medical): No  Physical Activity: Insufficiently Active (09/10/2020)   Exercise Vital Sign    Days of Exercise per Week: 5 days    Minutes of Exercise per Session: 20 min  Stress: No Stress Concern Present (09/10/2020)   [place]    Feeling of Stress : Only a little  Social Connections:  Socially Isolated (09/10/2020)   Social Connection and Isolation Panel [NHANES]    Frequency of Communication with Friends and Family: More than three times a week    Frequency of Social Gatherings with Friends and Family: Never    Attends Religious Services: Never    Marine scientist or Organizations: No    Attends Music therapist: Not on file    Marital Status: Never married     Family History: The patient's ***family history includes Cancer in his father and mother; Congestive Heart Failure in his brother; Diabetes Mellitus II in his mother; Heart Problems in his brother; Lupus in his brother.  ROS:   Please see the history of present illness.    *** All other systems reviewed and are negative.  EKGs/Labs/Other Studies Reviewed:    The following studies were reviewed today: ***  EKG:  EKG is *** ordered today.  The ekg ordered today demonstrates ***  TTE on 08/31/2022: 1. Severe LVH  with far more prominent hypertrophy of the septum. There is  no dynamic LVOT gradient. 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 . Left ventricular ejection fraction, by  estimation, is 30 to 35%. The left  ventricle has moderately decreased function. The left ventricle  demonstrates global hypokinesis. There is severe asymmetric left  ventricular hypertrophy of the septal segment. Left ventricular diastolic  parameters are consistent with Grade III diastolic  dysfunction (restrictive).   2. Right ventricular systolic function is normal. The right ventricular  size is normal. There is normal pulmonary artery systolic pressure.   3. Left atrial size was severely dilated.   4. Right atrial size was moderately dilated.   5. The mitral valve is normal in structure. No evidence of mitral valve  regurgitation. No evidence of mitral stenosis.   6. The aortic valve has an indeterminant number of cusps. Aortic valve  regurgitation is not visualized. No  aortic stenosis is present.   7. The inferior vena cava is normal in size with greater than 50%  respiratory variability, suggesting right atrial pressure of 3 mmHg.  CT Chest without Contrast on 08/30/2022: 1. The appearance the chest is most compatible with severe multilobar bilateral bronchopneumonia, most evident in the left lower lobe. 2. Cardiomegaly. 3. Dilatation of the pulmonic trunk (3.9 cm in diameter), concerning for pulmonary arterial hypertension.  cMRI on 08/26/2021: 1. 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 31 mm massive septal hypertrophy in the mid left ventricular septum. Significant diffuse delayed myocardial enhancement appears >15% of myocardial mass. ECV 36%, nonspecific elevated. T2 is 59 msec in area of maximal wall thickness. Findings of >30 mm wall thickness and elevated T2 signal suggest higher risk phenotype HCM.   2. Severely reduced LV systolic function by visual estimate, LVEF 30-35% with global hypokinesis.   2.  Mildly reduced RV function with normal RV size.   4. ECV value and inversion time myocardial nulling kinetics suggest against a diagnosis of cardiac amyloidosis.    Recent Labs: 09/04/2022: ALT 49 09/05/2022: B Natriuretic Peptide 3,302.0; BUN 30; Creatinine, Ser 1.45; Hemoglobin 10.6; Platelets 269; Potassium 3.8; Sodium 138  Recent Lipid Panel    Component Value Date/Time   CHOL 183 08/25/2021 0336   TRIG 120 08/25/2021 0336   TRIG 121 08/25/2021 0336   HDL 25 (L) 08/25/2021 0336   CHOLHDL 7.3 08/25/2021 0336   VLDL 24 08/25/2021 0336   LDLCALC 134 (H) 08/25/2021 0336   LDLCALC 132 (H) 01/27/2021 0951     Risk Assessment/Calculations:   {Does this patient have ATRIAL FIBRILLATION?:[phone]}  No BP recorded.  {Refresh Note OR Click here to enter BP  :1}***         Physical Exam:    VS:  There were no vitals taken for this visit.    Wt Readings from  Last 3 Encounters:  09/08/22 162 lb 12.8 oz (73.8 kg)  09/05/22 163 lb 4.8 oz (74.1 kg)  09/01/22 163 lb 9.3 oz (74.2 kg)     GEN: *** Well nourished, well developed in no acute distress HEENT: Normal NECK: No JVD; No carotid bruits LYMPHATICS: No lymphadenopathy CARDIAC: ***RRR, no murmurs, rubs, gallops RESPIRATORY:  Clear to auscultation without rales, wheezing or rhonchi  ABDOMEN: Soft, non-tender, non-distended MUSCULOSKELETAL:  No edema; No deformity  SKIN: Warm and dry NEUROLOGIC:  Alert and oriented x 3 PSYCHIATRIC:  Normal affect   ASSESSMENT:    No diagnosis found. PLAN:    In order  of problems listed above:  ***      {Are you ordering a CV Procedure (e.g. stress test, cath, DCCV, TEE, etc)?   Press F2        :[id number]}    Medication Adjustments/Labs and Tests Ordered: Current medicines are reviewed at length with the patient today.  Concerns regarding medicines are outlined above.  No orders of the defined types were placed in this encounter.  No orders of the defined types were placed in this encounter.   There are no Patient Instructions on file for this visit.   Signed, Finis Bud, NP  09/27/2022 5:20 PM    [facility]

## 2022-09-28 ENCOUNTER — Encounter: Payer: Self-pay | Admitting: Nurse Practitioner

## 2022-09-28 ENCOUNTER — Ambulatory Visit: Payer: Medicaid Other | Attending: Nurse Practitioner | Admitting: Nurse Practitioner

## 2022-10-29 ENCOUNTER — Telehealth: Payer: Self-pay

## 2022-10-29 DIAGNOSIS — I5042 Chronic combined systolic (congestive) and diastolic (congestive) heart failure: Secondary | ICD-10-CM

## 2022-10-29 MED ORDER — FUROSEMIDE 80 MG PO TABS: 80.0000 mg | ORAL_TABLET | Freq: Every day | ORAL | 1 refills | Status: DC

## 2022-10-29 NOTE — Telephone Encounter (Signed)
Patient requesting ID provider to refill lasix until he can establish care with new PCP. Routing message to provider for approval.   Eugenia Mcalpine, LPN

## 2022-10-29 NOTE — Telephone Encounter (Signed)
Notified patient that refill has been sent, but that he will need to establish with PCP or heart failure team for additional refills.   Beryle Flock, RN

## 2022-10-29 NOTE — Addendum Note (Signed)
Addended by: Lucie Leather D on: 10/29/2022 02:06 PM   Modules accepted: Orders

## 2023-01-01 ENCOUNTER — Other Ambulatory Visit: Payer: Self-pay | Admitting: Family

## 2023-01-01 DIAGNOSIS — I5042 Chronic combined systolic (congestive) and diastolic (congestive) heart failure: Secondary | ICD-10-CM

## 2023-01-04 ENCOUNTER — Telehealth: Payer: Self-pay

## 2023-01-04 NOTE — Telephone Encounter (Signed)
Patient calling requesting a refill on his Lasix. Are you ok refilling this. Laurine Kuyper T Brooks Sailors

## 2023-01-04 NOTE — Telephone Encounter (Signed)
Okay to refill per Terri Piedra, NP.   Beryle Flock, RN

## 2023-01-04 NOTE — Telephone Encounter (Signed)
Received refill request for Lasix, spoke with [place], he has lost his Medicaid and is trying to get insurance. Has not been able to establish with a PCP or cardiology yet. Reports he develops depressive symptoms each time he hits a roadblock with finding new insurance and has not made much progress.   Beryle Flock, RN

## 2023-01-12 ENCOUNTER — Other Ambulatory Visit: Payer: Self-pay | Admitting: *Deleted

## 2023-01-12 DIAGNOSIS — C8510 Unspecified B-cell lymphoma, unspecified site: Secondary | ICD-10-CM

## 2023-01-13 ENCOUNTER — Inpatient Hospital Stay: Payer: Medicare Other | Attending: Hematology

## 2023-01-20 ENCOUNTER — Inpatient Hospital Stay: Payer: Medicare Other | Admitting: Hematology

## 2023-02-07 ENCOUNTER — Emergency Department (HOSPITAL_COMMUNITY): Payer: Medicare Other

## 2023-02-07 ENCOUNTER — Observation Stay (HOSPITAL_COMMUNITY)
Admission: EM | Admit: 2023-02-07 | Discharge: 2023-02-08 | Disposition: A | Discharge status: Home / Self Care | Payer: Medicare Other | Attending: Family Medicine | Admitting: Family Medicine

## 2023-02-07 ENCOUNTER — Encounter (HOSPITAL_COMMUNITY): Payer: Self-pay | Admitting: Emergency Medicine

## 2023-02-07 ENCOUNTER — Other Ambulatory Visit: Payer: Self-pay

## 2023-02-07 DIAGNOSIS — I5042 Chronic combined systolic (congestive) and diastolic (congestive) heart failure: Secondary | ICD-10-CM

## 2023-02-07 DIAGNOSIS — Z87891 Personal history of nicotine dependence: Secondary | ICD-10-CM | POA: Insufficient documentation

## 2023-02-07 DIAGNOSIS — I509 Heart failure, unspecified: Secondary | ICD-10-CM

## 2023-02-07 DIAGNOSIS — I10 Essential (primary) hypertension: Secondary | ICD-10-CM | POA: Diagnosis not present

## 2023-02-07 DIAGNOSIS — Z91148 Patient's other noncompliance with medication regimen for other reason: Secondary | ICD-10-CM

## 2023-02-07 DIAGNOSIS — Z1152 Encounter for screening for COVID-19: Secondary | ICD-10-CM | POA: Diagnosis not present

## 2023-02-07 DIAGNOSIS — R7989 Other specified abnormal findings of blood chemistry: Secondary | ICD-10-CM | POA: Diagnosis not present

## 2023-02-07 DIAGNOSIS — Z7982 Long term (current) use of aspirin: Secondary | ICD-10-CM | POA: Insufficient documentation

## 2023-02-07 DIAGNOSIS — I130 Hypertensive heart and chronic kidney disease with heart failure and stage 1 through stage 4 chronic kidney disease, or unspecified chronic kidney disease: Secondary | ICD-10-CM | POA: Diagnosis not present

## 2023-02-07 DIAGNOSIS — I161 Hypertensive emergency: Principal | ICD-10-CM | POA: Insufficient documentation

## 2023-02-07 DIAGNOSIS — I5043 Acute on chronic combined systolic (congestive) and diastolic (congestive) heart failure: Secondary | ICD-10-CM | POA: Diagnosis present

## 2023-02-07 DIAGNOSIS — I169 Hypertensive crisis, unspecified: Secondary | ICD-10-CM | POA: Diagnosis present

## 2023-02-07 DIAGNOSIS — Z79899 Other long term (current) drug therapy: Secondary | ICD-10-CM | POA: Diagnosis not present

## 2023-02-07 DIAGNOSIS — F1410 Cocaine abuse, uncomplicated: Secondary | ICD-10-CM | POA: Diagnosis present

## 2023-02-07 DIAGNOSIS — Z720 Tobacco use: Secondary | ICD-10-CM | POA: Diagnosis present

## 2023-02-07 DIAGNOSIS — B20 Human immunodeficiency virus [HIV] disease: Secondary | ICD-10-CM | POA: Diagnosis present

## 2023-02-07 DIAGNOSIS — J9601 Acute respiratory failure with hypoxia: Secondary | ICD-10-CM | POA: Insufficient documentation

## 2023-02-07 DIAGNOSIS — N189 Chronic kidney disease, unspecified: Secondary | ICD-10-CM | POA: Diagnosis not present

## 2023-02-07 DIAGNOSIS — R0602 Shortness of breath: Secondary | ICD-10-CM | POA: Diagnosis present

## 2023-02-07 DIAGNOSIS — C8330 Diffuse large B-cell lymphoma, unspecified site: Secondary | ICD-10-CM | POA: Diagnosis not present

## 2023-02-07 DIAGNOSIS — C8510 Unspecified B-cell lymphoma, unspecified site: Secondary | ICD-10-CM | POA: Diagnosis present

## 2023-02-07 DIAGNOSIS — F1910 Other psychoactive substance abuse, uncomplicated: Secondary | ICD-10-CM | POA: Diagnosis present

## 2023-02-07 LAB — CBC WITH DIFFERENTIAL/PLATELET
Abs Immature Granulocytes: 0.02 10*3/uL (ref 0.00–0.07)
Basophils Absolute: 0 10*3/uL (ref 0.0–0.1)
Basophils Relative: 0 %
Eosinophils Absolute: 0.1 10*3/uL (ref 0.0–0.5)
Eosinophils Relative: 1 %
HCT: 39.9 % (ref 39.0–52.0)
Hemoglobin: 13.1 g/dL (ref 13.0–17.0)
Immature Granulocytes: 0 %
Lymphocytes Relative: 16 %
Lymphs Abs: 1.4 10*3/uL (ref 0.7–4.0)
MCH: 30.4 pg (ref 26.0–34.0)
MCHC: 32.8 g/dL (ref 30.0–36.0)
MCV: 92.6 fL (ref 80.0–100.0)
Monocytes Absolute: 0.4 10*3/uL (ref 0.1–1.0)
Monocytes Relative: 4 %
Neutro Abs: 6.9 10*3/uL (ref 1.7–7.7)
Neutrophils Relative %: 79 %
Platelets: 145 10*3/uL — ABNORMAL LOW (ref 150–400)
RBC: 4.31 MIL/uL (ref 4.22–5.81)
RDW: 14.9 % (ref 11.5–15.5)
WBC: 8.7 10*3/uL (ref 4.0–10.5)
nRBC: 0 % (ref 0.0–0.2)

## 2023-02-07 LAB — RAPID URINE DRUG SCREEN, HOSP PERFORMED
Amphetamines: NOT DETECTED
Barbiturates: NOT DETECTED
Benzodiazepines: NOT DETECTED
Cocaine: POSITIVE — AB
Opiates: NOT DETECTED
Tetrahydrocannabinol: POSITIVE — AB

## 2023-02-07 LAB — CREATININE, SERUM
Creatinine, Ser: 1.18 mg/dL (ref 0.61–1.24)
GFR, Estimated: >60 mL/min (ref >60)

## 2023-02-07 LAB — BRAIN NATRIURETIC PEPTIDE: B Natriuretic Peptide: 4036 pg/mL — ABNORMAL HIGH (ref 0.0–100.0)

## 2023-02-07 LAB — BASIC METABOLIC PANEL
Anion gap: 11 (ref 5–15)
BUN: 18 mg/dL (ref 6–20)
CO2: 21 mmol/L — ABNORMAL LOW (ref 22–32)
Calcium: 8.8 mg/dL — ABNORMAL LOW (ref 8.9–10.3)
Chloride: 105 mmol/L (ref 98–111)
Creatinine, Ser: 1.16 mg/dL (ref 0.61–1.24)
GFR, Estimated: >60 mL/min (ref >60)
Glucose, Bld: 104 mg/dL — ABNORMAL HIGH (ref 70–99)
Potassium: 3.6 mmol/L (ref 3.5–5.1)
Sodium: 137 mmol/L (ref 135–145)

## 2023-02-07 LAB — MAGNESIUM: Magnesium: 2 mg/dL (ref 1.7–2.4)

## 2023-02-07 LAB — LACTIC ACID, PLASMA: Lactic Acid, Venous: 0.9 mmol/L (ref 0.5–1.9)

## 2023-02-07 LAB — PROTIME-INR
INR: 1.1 (ref 0.8–1.2)
Prothrombin Time: 14 seconds (ref 11.4–15.2)

## 2023-02-07 LAB — PHOSPHORUS: Phosphorus: 3.2 mg/dL (ref 2.5–4.6)

## 2023-02-07 LAB — TROPONIN I (HIGH SENSITIVITY)
Troponin I (High Sensitivity): 133 ng/L (ref <18)
Troponin I (High Sensitivity): 149 ng/L (ref <18)
Troponin I (High Sensitivity): 159 ng/L (ref <18)
Troponin I (High Sensitivity): 160 ng/L (ref <18)

## 2023-02-07 LAB — SARS CORONAVIRUS 2 BY RT PCR: SARS Coronavirus 2 by RT PCR: NEGATIVE

## 2023-02-07 LAB — MRSA NEXT GEN BY PCR, NASAL: MRSA by PCR Next Gen: DETECTED — AB

## 2023-02-07 MED ORDER — HEPARIN SODIUM (PORCINE) 5000 UNIT/ML IJ SOLN
5000.0000 [IU] | Freq: Three times a day (TID) | INTRAMUSCULAR | Status: DC
  Administered 2023-02-07 – 2023-02-08 (×3): 5000 [IU] via SUBCUTANEOUS
  Filled 2023-02-07 (×3): qty 1

## 2023-02-07 MED ORDER — ONDANSETRON HCL 4 MG/2ML IJ SOLN
4.0000 mg | Freq: Four times a day (QID) | INTRAMUSCULAR | Status: DC | PRN
  Administered 2023-02-07: 4 mg via INTRAVENOUS
  Filled 2023-02-07: qty 2

## 2023-02-07 MED ORDER — HYDRALAZINE HCL 20 MG/ML IJ SOLN: 10.0000 mg | INTRAMUSCULAR | Status: DC | PRN

## 2023-02-07 MED ORDER — IPRATROPIUM BROMIDE 0.02 % IN SOLN: 0.5000 mg | Freq: Four times a day (QID) | RESPIRATORY_TRACT | Status: DC | PRN

## 2023-02-07 MED ORDER — FUROSEMIDE 10 MG/ML IJ SOLN
120.0000 mg | Freq: Once | INTRAVENOUS | Status: AC
  Administered 2023-02-07: 120 mg via INTRAVENOUS
  Filled 2023-02-07: qty 12

## 2023-02-07 MED ORDER — MORPHINE SULFATE (PF) 4 MG/ML IV SOLN
4.0000 mg | Freq: Once | INTRAVENOUS | Status: AC
  Administered 2023-02-07: 4 mg via INTRAVENOUS
  Filled 2023-02-07: qty 1

## 2023-02-07 MED ORDER — HYDRALAZINE HCL 20 MG/ML IJ SOLN
10.0000 mg | Freq: Once | INTRAMUSCULAR | Status: AC
  Administered 2023-02-07: 10 mg via INTRAVENOUS
  Filled 2023-02-07: qty 1

## 2023-02-07 MED ORDER — ACETAMINOPHEN 325 MG PO TABS
650.0000 mg | ORAL_TABLET | Freq: Four times a day (QID) | ORAL | Status: DC | PRN
  Administered 2023-02-07: 650 mg via ORAL
  Filled 2023-02-07: qty 2

## 2023-02-07 MED ORDER — NITROGLYCERIN 2 % TD OINT
1.0000 [in_us] | TOPICAL_OINTMENT | Freq: Once | TRANSDERMAL | Status: AC
  Administered 2023-02-07: 1 [in_us] via TOPICAL
  Filled 2023-02-07: qty 1

## 2023-02-07 MED ORDER — FUROSEMIDE 10 MG/ML IJ SOLN
10.0000 mg/h | INTRAVENOUS | Status: DC
  Administered 2023-02-07: 10 mg/h via INTRAVENOUS
  Filled 2023-02-07 (×3): qty 20

## 2023-02-07 MED ORDER — ONDANSETRON HCL 4 MG/2ML IJ SOLN: 4.0000 mg | Freq: Four times a day (QID) | INTRAMUSCULAR | Status: DC | PRN

## 2023-02-07 MED ORDER — ACETAMINOPHEN 650 MG RE SUPP: 650.0000 mg | Freq: Four times a day (QID) | RECTAL | Status: DC | PRN

## 2023-02-07 MED ORDER — FLEET ENEMA 7-19 GM/118ML RE ENEM: 1.0000 | ENEMA | Freq: Once | RECTAL | Status: DC | PRN

## 2023-02-07 MED ORDER — ONDANSETRON HCL 4 MG PO TABS: 4.0000 mg | ORAL_TABLET | Freq: Four times a day (QID) | ORAL | Status: DC | PRN

## 2023-02-07 MED ORDER — SODIUM CHLORIDE 0.9 % IV SOLN: 250.0000 mL | INTRAVENOUS | Status: DC | PRN

## 2023-02-07 MED ORDER — BICTEGRAVIR-EMTRICITAB-TENOFOV 50-200-25 MG PO TABS
1.0000 | ORAL_TABLET | Freq: Every day | ORAL | Status: DC
  Administered 2023-02-07 – 2023-02-08 (×2): 1 via ORAL
  Filled 2023-02-07 (×4): qty 1

## 2023-02-07 MED ORDER — BISACODYL 5 MG PO TBEC: 5.0000 mg | DELAYED_RELEASE_TABLET | Freq: Every day | ORAL | Status: DC | PRN

## 2023-02-07 MED ORDER — SODIUM CHLORIDE 0.9% FLUSH: 3.0000 mL | INTRAVENOUS | Status: DC | PRN

## 2023-02-07 MED ORDER — ZOLPIDEM TARTRATE 5 MG PO TABS: 5.0000 mg | ORAL_TABLET | Freq: Every evening | ORAL | Status: DC | PRN

## 2023-02-07 MED ORDER — LABETALOL HCL 5 MG/ML IV SOLN: 10.0000 mg | INTRAVENOUS | Status: DC | PRN

## 2023-02-07 MED ORDER — HYDROMORPHONE HCL 1 MG/ML IJ SOLN: 0.5000 mg | INTRAMUSCULAR | Status: DC | PRN

## 2023-02-07 MED ORDER — POTASSIUM CHLORIDE CRYS ER 20 MEQ PO TBCR
40.0000 meq | EXTENDED_RELEASE_TABLET | Freq: Once | ORAL | Status: AC
  Administered 2023-02-07: 40 meq via ORAL
  Filled 2023-02-07: qty 2

## 2023-02-07 MED ORDER — CARVEDILOL 12.5 MG PO TABS
25.0000 mg | ORAL_TABLET | Freq: Two times a day (BID) | ORAL | Status: DC
  Administered 2023-02-07 – 2023-02-08 (×2): 25 mg via ORAL
  Filled 2023-02-07 (×2): qty 2

## 2023-02-07 MED ORDER — LOSARTAN POTASSIUM 50 MG PO TABS
50.0000 mg | ORAL_TABLET | Freq: Every day | ORAL | Status: DC
  Administered 2023-02-07 – 2023-02-08 (×2): 50 mg via ORAL
  Filled 2023-02-07 (×2): qty 1

## 2023-02-07 MED ORDER — ACETAMINOPHEN 325 MG PO TABS: 650.0000 mg | ORAL_TABLET | Freq: Four times a day (QID) | ORAL | Status: DC | PRN

## 2023-02-07 MED ORDER — SODIUM CHLORIDE 0.9% FLUSH
3.0000 mL | Freq: Two times a day (BID) | INTRAVENOUS | Status: DC
  Administered 2023-02-08: 3 mL via INTRAVENOUS

## 2023-02-07 MED ORDER — SODIUM CHLORIDE 0.9% FLUSH: 3.0000 mL | Freq: Two times a day (BID) | INTRAVENOUS | Status: DC

## 2023-02-07 MED ORDER — ASPIRIN 81 MG PO CHEW
81.0000 mg | CHEWABLE_TABLET | Freq: Every day | ORAL | Status: DC
  Administered 2023-02-08: 81 mg via ORAL
  Filled 2023-02-07 (×2): qty 1

## 2023-02-07 MED ORDER — OXYCODONE HCL 5 MG PO TABS: 5.0000 mg | ORAL_TABLET | ORAL | Status: DC | PRN

## 2023-02-07 MED ORDER — NITROGLYCERIN IN D5W 200-5 MCG/ML-% IV SOLN
5.0000 ug/min | INTRAVENOUS | Status: DC
  Administered 2023-02-07: 5 ug/min via INTRAVENOUS
  Filled 2023-02-07: qty 250

## 2023-02-07 MED ORDER — SENNOSIDES-DOCUSATE SODIUM 8.6-50 MG PO TABS: 1.0000 | ORAL_TABLET | Freq: Every evening | ORAL | Status: DC | PRN

## 2023-02-07 MED ORDER — HYDRALAZINE HCL 25 MG PO TABS
50.0000 mg | ORAL_TABLET | Freq: Three times a day (TID) | ORAL | Status: DC
  Administered 2023-02-07: 50 mg via ORAL
  Filled 2023-02-07: qty 2

## 2023-02-07 MED ORDER — LEVALBUTEROL HCL 0.63 MG/3ML IN NEBU: 0.6300 mg | INHALATION_SOLUTION | Freq: Four times a day (QID) | RESPIRATORY_TRACT | Status: DC | PRN

## 2023-02-07 NOTE — Assessment & Plan Note (Signed)
-  Will continue to monitor on ICU -Continue Lasix drip>>> weaning off the drip >>> initiating Lasix 40 mg IV twice daily -Monitoring I's and O's closely Filed Weights   02/07/23 0823 02/07/23 1455 02/08/23 0722  Weight: 77.1 kg 70.5 kg 68.6 kg    Intake/Output Summary (Last 24 hours) at 02/08/2023 1015 Last data filed at 02/08/2023 [id number] Gross per 24 hour  Intake 1474.76 ml  Output 2900 ml  Net -1425.24 ml    -Monitoring daily weight -Reds clip -eating daily -Appreciate cardiology following closely EKGs were reviewed by cardiology yesterday at [facility],  -Last echo 09/05/2022: Hypertrophic nonobstructive cardiomyopathy. EJF 30 - 35%.   LV moderately decreased function. LV global hypokinesis. Severe asymmetric left ventricular hypertrophy of the septal segment. Left ventricular diastolic parameters are consistent with Grade III diastolic dysfunction (restrictive). Right Ventricle: The right ventricular size is normal.     -Repeating 2D echocardiogram >>>>

## 2023-02-07 NOTE — Plan of Care (Signed)
Dr Roger Shelter called cardiology for abnormal EKG concerning for ST elevations in V1 and V2. I personally reviewed the EKG from 02/07/23 performed at 15:09 that showed NSR, LVH with repolarization abnormality. Repolarization abnormality noted in V1 and V2. No reciprocal ST depressions noted. Discussed with Dr Roger Shelter.  Richard Cox Richard Levy, MD [place]  3:31 PM

## 2023-02-07 NOTE — Assessment & Plan Note (Signed)
-  Please see CHF exacerbation -Review home medication -resuming accordingly -Workup as above -Cardiology consulted, medication reviewed, weaning off Lasix drip and nitroglycerin drip

## 2023-02-07 NOTE — Consult Note (Addendum)
NAME:  Richard Cox, MRN:  [id number], DOB:  1981/06/18, LOS: 0 ADMISSION DATE:  02/07/2023, CONSULTATION DATE: 02/07/2023 REFERRING MD:  Skipper Cliche A , CHIEF COMPLAINT: Shortness of breath  History of Present Illness:  42 year old male with chronic combined systolic and diastolic congestive heart failure, hypertension, B-cell lymphoma in remission, HIV and polysubstance abuse who was brought into the emergency department at [facility] with complaint of increasing shortness of breath since last night.  Patient was noted to have systolic blood pressure of 194/134, stating that he had not taken his antihypertensive meds this morning.  Patient denies chest pain, nausea, vomiting, headache or other complaints  Pertinent  Medical History   Past Medical History:  Diagnosis Date   Acute respiratory failure with hypoxia ([place]) 08/24/2021   CHF (congestive heart failure) (HCC)    Endotracheally intubated 08/24/2021   Essential hypertension    HIV infection ([place])    Lymphoma ([place]) 08/28/2020   Noncompliance with medication regimen    Secondary cardiomyopathy ([place])      Significant Hospital Events: Including procedures, antibiotic start and stop dates in addition to other pertinent events     Interim History / Subjective:  Consulted  Objective   Blood pressure (!) 169/106, pulse (!) 107, temperature 98.4 F (36.9 C), temperature source Oral, resp. rate 20, height 6\' 1"  (1.854 m), weight 70.5 kg, SpO2 95 %.       No intake or output data in the 24 hours ending 02/07/23 1515 Filed Weights   02/07/23 0823 02/07/23 1455  Weight: 77.1 kg 70.5 kg    Examination: Physical exam: General: Acutely ill-appearing male, lying on the bed HEENT: [place]/AT, eyes anicteric.  moist mucus membranes Neuro: Alert, awake following commands, following commands Heart: Tachycardia  Labs and images were reviewed   Resolved Hospital Problem list     Assessment & Plan:  Acute hypoxic  respiratory failure due to flash pulmonary edema Acute on chronic biventricular HFrEF Hypertensive emergency [place] cardiac ischemia HIV Polysubstance abuse  Continue nasal cannula oxygen, if shortness of breath does not improve, try BiPAP Clinically patient is on 2 L nasal cannula oxygen Continue IV Lasix infusion for now, repeat BMP in the morning Monitor intake and output Currently on nitroglycerin infusion, continue with SBP goal 140-160 until tomorrow morning then we can decrease blood pressure below 140 to prevent AKI Resume oral antihypertensive meds including Coreg, losartan and hydralazine Trend troponins EKG showed LVH, no ischemic changes at this time Continue antiretroviral medications Watch for signs of withdrawal  Best Practice (right click and "Reselect all SmartList Selections" daily)   Per primary team Labs   CBC: Recent Labs  Lab 02/07/23 0858  WBC 8.7  NEUTROABS 6.9  HGB 13.1  HCT 39.9  MCV 92.6  PLT 145*    Basic Metabolic Panel: Recent Labs  Lab 02/07/23 0858 02/07/23 1057  NA 137  --   K 3.6  --   CL 105  --   CO2 21*  --   GLUCOSE 104*  --   BUN 18  --   CREATININE 1.16 1.18  CALCIUM 8.8*  --   MG  --  2.0  PHOS  --  3.2   GFR: Estimated Creatinine Clearance: 83.8 mL/min (by C-G formula based on SCr of 1.18 mg/dL). Recent Labs  Lab 02/07/23 0858 02/07/23 0901  WBC 8.7  --   LATICACIDVEN  --  0.9    Liver Function Tests: No results for input(s): "AST", "ALT", "ALKPHOS", "  BILITOT", "PROT", "ALBUMIN" in the last 168 hours. No results for input(s): "LIPASE", "AMYLASE" in the last 168 hours. No results for input(s): "AMMONIA" in the last 168 hours.  ABG    Component Value Date/Time   PHART 7.422 08/25/2021 0410   PCO2ART 41.6 08/25/2021 0410   PO2ART 134 (H) 08/25/2021 0410   HCO3 27.0 08/25/2021 0410   TCO2 28 08/25/2021 0410   ACIDBASEDEF 1.6 08/24/2021 0500   O2SAT 99.0 08/25/2021 0410     Coagulation Profile: Recent  Labs  Lab 02/07/23 1057  INR 1.1    Cardiac Enzymes: No results for input(s): "CKTOTAL", "CKMB", "CKMBINDEX", "TROPONINI" in the last 168 hours.  HbA1C: Hgb A1c MFr Bld  Date/Time Value Ref Range Status  08/25/2021 03:36 AM 5.1 4.8 - 5.6 % Final    Comment:    (NOTE) Pre diabetes:          5.7%-6.4%  Diabetes:              >6.4%  Glycemic control for   <7.0% adults with diabetes   09/18/2019 12:36 AM 5.5 4.8 - 5.6 % Final    Comment:    (NOTE) Pre diabetes:          5.7%-6.4% Diabetes:              >6.4% Glycemic control for   <7.0% adults with diabetes     CBG: No results for input(s): "GLUCAP" in the last 168 hours.  Review of Systems:   12 point review of systems significant for complaint mentioned HPI, rest is negative  Past Medical History:  He,  has a past medical history of Acute respiratory failure with hypoxia ([place]) (08/24/2021), CHF (congestive heart failure) ([place]), Endotracheally intubated (08/24/2021), Essential hypertension, HIV infection ([place]), Lymphoma ([place]) (08/28/2020), Noncompliance with medication regimen, and Secondary cardiomyopathy ([place]).   Surgical History:   Past Surgical History:  Procedure Laterality Date   IR IMAGING GUIDED PORT INSERTION  08/23/2020   Right   NO PAST SURGERIES       Social History:   reports that he has quit smoking. His smoking use included cigarettes. He smoked an average of .25 packs per day. He has never used smokeless tobacco. He reports that he does not currently use alcohol. He reports current drug use. Drugs: Marijuana and Cocaine.   Family History:  His family history includes Cancer in his father and mother; Congestive Heart Failure in his brother; Diabetes Mellitus II in his mother; Heart Problems in his brother; Lupus in his brother.   Allergies Allergies  Allergen Reactions   Motrin [Ibuprofen] Hypertension   Neosporin [Neomycin-Bacitracin Zn-Polymyx] Rash     Home Medications  Prior to Admission  medications   Medication Sig Start Date End Date Taking? Authorizing Provider  acetaminophen (TYLENOL) 500 MG tablet Take 1,000 mg by mouth every 6 (six) hours as needed for moderate pain or headache.   Yes [provider]  aspirin 81 MG chewable tablet Chew 1 tablet (81 mg total) by mouth daily. 08/28/21  Yes Vann, Jessica U, DO  bictegravir-emtricitabine-tenofovir AF (BIKTARVY) 50-200-25 MG TABS tablet Take 1 tablet by mouth daily. 09/08/22  Yes Golden Circle, FNP  carvedilol (COREG) 25 MG tablet Take 1 tablet (25 mg total) by mouth 2 (two) times daily with a meal. 09/01/22  Yes Tat, Shanon Brow, MD  furosemide (LASIX) 80 MG tablet Take 1 tablet by mouth once daily 01/04/23  Yes Golden Circle, FNP  hydrALAZINE (APRESOLINE) 100 MG tablet  Take 0.5 tablets (50 mg total) by mouth 3 (three) times daily. 09/05/22  Yes Kathie Dike, MD  losartan (COZAAR) 50 MG tablet Take 1 tablet (50 mg total) by mouth daily. 09/08/22  Yes Golden Circle, FNP     Critical care time:     This patient is critically ill with multiple organ system failure which requires frequent high complexity decision making, assessment, support, evaluation, and titration of therapies. This was completed through the application of advanced monitoring technologies and extensive interpretation of multiple databases.  During this encounter critical care time was devoted to patient care services described in this note for 36 minutes.    Jacky Kindle, MD [place] [facility] See Amion for pager If no response to pager, please call [phone] until 7pm After 7pm, Please call E-link [phone]

## 2023-02-07 NOTE — Assessment & Plan Note (Addendum)
-  Review home medication will resume  -Continue home medication of BIKTARVY -Compliance rate reinforced

## 2023-02-07 NOTE — ED Triage Notes (Signed)
Pt reports hx of CHF. Pt reports this is a CHF related and he is SOB.

## 2023-02-07 NOTE — Assessment & Plan Note (Addendum)
-  Resolved -Likely was due to hypertensive crisis -Off nitro drip, on room air now satting 97%

## 2023-02-07 NOTE — Assessment & Plan Note (Signed)
-  Likely due to hypertensive crisis, tachyarrhythmia -Ischemic demand -EKG tachyarrhythmia negative any ST elevation depression per EDP -EP has discussed the case with cardiology on-call at [facility] Who is recommending current management of blood pressure and hypertensive crisis, diuretics

## 2023-02-07 NOTE — Assessment & Plan Note (Signed)
-  Counseled against smoking use and abuse -NicoDerm patch provided

## 2023-02-07 NOTE — Assessment & Plan Note (Signed)
-  Urine drug screen positive for cocaine and marijuana -Patient was counseled extensively regarding substance use and abuse Expressed understanding

## 2023-02-07 NOTE — Assessment & Plan Note (Addendum)
-  Compliant with home medication -Reviewed and will resume home medication  Coreg 25 mg daily, hydralazine 50 mg p.o. 3 times daily, losartan 50 mg p.o. daily,

## 2023-02-07 NOTE — H&P (Addendum)
History and Physical   Patient: Richard Cox                            PCP: Pcp, No                    DOB: Jun 03, 1981            DOA: 02/07/2023 [id number]             DOS: 02/07/2023, 1:20 PM  Pcp, No  Patient coming from:   HOME  I have personally reviewed patient's medical records, in electronic medical records, including:  [place] link, and care everywhere.    Chief Complaint:   Chief Complaint  Patient presents with   Shortness of Breath    History of present illness:    Richard Cox is a 42 year old male with a history of chronic combined CHF with ejection fraction of 30 to 35%, hypertension, HIV, B-cell lymphoma substance abuse: Including cocaine, tobacco, alcohol  ... Presented today to ED with chief complaint of shortness of breath, markedly elevated blood pressure.,  Stating he has ran out of his medications.  His symptoms started early this morning.  Stating he ran out of Lasix and other medication yesterday. Denies have any headaches, visual changes, or any new focal neurological findings denies any chest pain.   (Patient had a similar presentation admission on October 2023)  ED duration/course: BP on arrival 219/147 Blood pressure (!) 188/138, pulse (!) 122, temperature 98.4 F (36.9 C), temperature source Oral, resp. rate (!) 32,  SpO2 94 %, on 2 L.  CBC: WC 8.7, platelets 145, CMP: Electrolytes normal, BUN 18, creatinine 1.16 CO2 21, calcium 8.8, glucose 104, proBNP 4036,  lactic acid 0.9 CXR: indicated pulmonary edema.  EKG did not show any acute changes.  Troponin:   133, 149,  Patient was started on nitroglycerin drip, Lasix drip.. EDP discussed with cardiology at [facility] feels that patient does not need any urgent intervention, continue diuretics, and treating hypertensive crisis    Patient Denies having: Fever, Chills, Cough, SOB, Chest Pain, Abd pain, N/V/D, headache, dizziness, lightheadedness,  Dysuria, Joint pain, rash, open  wounds   Review of Systems: As per HPI, otherwise 10 point review of systems were negative.   ----------------------------------------------------------------------------------------------------------------------  Allergies  Allergen Reactions   Neosporin [Neomycin-Bacitracin Zn-Polymyx] Rash    Home MEDs:  Prior to Admission medications   Medication Sig Start Date End Date Taking? Authorizing Provider  aspirin 81 MG chewable tablet Chew 1 tablet (81 mg total) by mouth daily. 08/28/21  Yes Vann, Jessica U, DO  bictegravir-emtricitabine-tenofovir AF (BIKTARVY) 50-200-25 MG TABS tablet Take 1 tablet by mouth daily. 09/08/22  Yes Golden Circle, FNP  carvedilol (COREG) 25 MG tablet Take 1 tablet (25 mg total) by mouth 2 (two) times daily with a meal. 09/01/22  Yes Tat, Shanon Brow, MD  furosemide (LASIX) 80 MG tablet Take 1 tablet by mouth once daily 01/04/23  Yes Golden Circle, FNP  hydrALAZINE (APRESOLINE) 100 MG tablet Take 0.5 tablets (50 mg total) by mouth 3 (three) times daily. 09/05/22  Yes Kathie Dike, MD  losartan (COZAAR) 50 MG tablet Take 1 tablet (50 mg total) by mouth daily. 09/08/22  Yes Golden Circle, FNP  acetaminophen (TYLENOL) 500 MG tablet Take 1,000 mg by mouth every 6 (six) hours as needed for moderate pain or headache.    [provider]    PRN  MEDs: sodium chloride, acetaminophen **OR** acetaminophen, bisacodyl, hydrALAZINE, HYDROmorphone (DILAUDID) injection, ipratropium, labetalol, levalbuterol, ondansetron **OR** ondansetron (ZOFRAN) IV, oxyCODONE, senna-docusate, sodium chloride flush, sodium phosphate, zolpidem  Past Medical History:  Diagnosis Date   Acute respiratory failure with hypoxia ([place]) 08/24/2021   CHF (congestive heart failure) ([place])    Endotracheally intubated 08/24/2021   Essential hypertension    HIV infection ([place])    Lymphoma ([place]) 08/28/2020   Noncompliance with medication regimen    Secondary cardiomyopathy ([place])      Past Surgical History:  Procedure Laterality Date   IR IMAGING GUIDED PORT INSERTION  08/23/2020   Right   NO PAST SURGERIES       reports that he has quit smoking. His smoking use included cigarettes. He smoked an average of .25 packs per day. He has never used smokeless tobacco. He reports that he does not currently use alcohol. He reports current drug use. Drugs: Marijuana and Cocaine.   Family History  Problem Relation Age of Onset   Diabetes Mellitus II Mother    Cancer Mother        multiple myeloma   Cancer Father        multiple myeloma   Congestive Heart Failure Brother    Heart Problems Brother    Lupus Brother     Physical Exam:   Vitals:   02/07/23 1200 02/07/23 1215 02/07/23 1230 02/07/23 1234  BP: (!) 193/138 (!) 195/140 (!) 188/138   Pulse: (!) 117 (!) 119 (!) 122   Resp:      Temp:    98.4 F (36.9 C)  TempSrc:    Oral  SpO2: 95% 92% 94%   Weight:      Height:       Constitutional: NAD, calm, comfortable Eyes: PERRL, lids and conjunctivae normal ENMT: Mucous membranes are moist. Posterior pharynx clear of any exudate or lesions.Normal dentition.  Neck: normal, supple, no masses, no thyromegaly Respiratory: clear to auscultation bilaterally, no wheezing, no crackles. Normal respiratory effort. No accessory muscle use.  Cardiovascular: Regular rate and rhythm, no murmurs / rubs / gallops. No extremity edema. 2+ pedal pulses. No carotid bruits.  Abdomen: no tenderness, no masses palpated. No hepatosplenomegaly. Bowel sounds positive.  Musculoskeletal: no clubbing / cyanosis. No joint deformity upper and lower extremities. Good ROM, no contractures. Normal muscle tone.  Neurologic: CN II-XII grossly intact. Sensation intact, DTR normal. Strength 5/5 in all 4.  Psychiatric: Normal judgment and insight. Alert and oriented x 3. Normal mood.  Skin: no rashes, lesions, ulcers. No induration Decubitus/ulcers:  Wounds: per nursing documentation    Labs on  admission:    I have personally reviewed following labs and imaging studies  CBC: Recent Labs  Lab 02/07/23 0858  WBC 8.7  NEUTROABS 6.9  HGB 13.1  HCT 39.9  MCV 92.6  PLT [id number]*   Basic Metabolic Panel: Recent Labs  Lab 02/07/23 0858  NA 137  K 3.6  CL 105  CO2 21*  GLUCOSE 104*  BUN 18  CREATININE 1.16  CALCIUM 8.8*    Urine analysis:    Component Value Date/Time   COLORURINE YELLOW 08/30/2022 0625   APPEARANCEUR CLEAR 08/30/2022 0625   LABSPEC 1.016 08/30/2022 0625   PHURINE 5.0 08/30/2022 0625   GLUCOSEU NEGATIVE 08/30/2022 0625   HGBUR SMALL (A) 08/30/2022 0625   BILIRUBINUR NEGATIVE 08/30/2022 [place] 08/30/2022 0625   PROTEINUR 100 (A) 08/30/2022 0625   NITRITE NEGATIVE 08/30/2022 0625   LEUKOCYTESUR  NEGATIVE 08/30/2022 0625    Last A1C:  Lab Results  Component Value Date   HGBA1C 5.1 08/25/2021     Radiologic Exams on Admission:   DG Chest Port 1 View  Result Date: 02/07/2023 CLINICAL DATA:  sob EXAM: PORTABLE CHEST - 1 VIEW COMPARISON:  09/04/2022 FINDINGS: Worsening of bilateral diffuse interstitial opacities with scattered small ill-defined airspace infiltrates. Stable cardiomegaly.  Stable right IJ port catheter to the SVC. No effusion. Visualized bones unremarkable. IMPRESSION: Worsening bilateral interstitial and airspace disease. Electronically Signed   By: Lucrezia Europe M.D.   On: 02/07/2023 08:53    EKG:   Independently reviewed.  Orders placed or performed during the hospital encounter of 02/07/23   ED EKG   ED EKG   EKG 12-Lead   EKG 12-Lead   EKG 12-Lead   EKG 12-Lead   EKG 12-Lead   EKG 12-Lead   EKG 12-Lead   ---------------------------------------------------------------------------------------------------------------------------------------    Assessment / Plan:   Principal Problem:   Acute on chronic combined systolic and diastolic CHF (congestive heart failure) (HCC) Active Problems:    Hypertensive crisis   Acute respiratory failure with hypoxia (HCC)   HTN (hypertension)   Noncompliance with medication regimen   HIV (human immunodeficiency virus infection) (HCC)   Substance abuse (HCC)   Tobacco abuse   High grade B-cell lymphoma (HCC)   Elevated troponin   CHF exacerbation (HCC)   Cocaine abuse (HCC)   Assessment and Plan: * Acute on chronic combined systolic and diastolic CHF (congestive heart failure) ([place]) -Admitted to ICU -Continue Lasix drip -Monitoring I's and O's closely -Monitoring daily weight -Reds clip -eating daily -Will consult cardiology for evaluation recommendations -Last echo 09/05/2022   ECHOCARDIOGRAM COMPLETE  Findings consistent with hypertrophic nonobstructive cardiomyopathy. EJF 30 - 35%.   The left ventricle has moderately decreased function. The left ventricle demonstrates global hypokinesis. The left ventricular internal cavity size was normal in size. There is severe asymmetric left ventricular hypertrophy of the septal segment. Left ventricular diastolic parameters are consistent with Grade III diastolic dysfunction (restrictive). Right Ventricle: The right ventricular size is normal.     -Repeating 2D echocardiogram  Hypertensive crisis -On arrival blood pressure 219/147 -Patient has been started on nitroglycerin drip, currently blood pressure is 188/138 -Monitoring closely, continue nitroglycerin drip, will reduce blood pressure Slowly in ICU settings  -Resuming home medication of Coreg 25 mg daily, hydralazine 50 mg p.o. 3 times daily, losartan 50 mg p.o. daily,  Acute respiratory failure with hypoxia (HCC) -Likely due to pulmonary edema, due to hypertensive crisis -Continue with IV diuretics drip and nitroglycerin drip -Continue supplemental oxygen..  Maintaining O2 sat greater 94%  HTN (hypertension) -Compliant with home medication -Review home medication will resume accordingly  Coreg 25 mg daily, hydralazine 50 mg  p.o. 3 times daily, losartan 50 mg p.o. daily,  CHF exacerbation (HCC) -Please see CHF exacerbation -Review home medication -resuming accordingly -Workup as above -Cardiology consult  Elevated troponin -Likely due to hypertensive crisis, tachyarrhythmia -Ischemic demand -EKG tachyarrhythmia negative any ST elevation depression per EDP -EP has discussed the case with cardiology on-call at [facility] Who is recommending current management of blood pressure and hypertensive crisis, diuretics  High grade B-cell lymphoma ([place]) -Currently not on any medication -Follow-up with oncology as an outpatient  Tobacco abuse -Counseled against smoking use and abuse -NicoDerm patch provided  Substance abuse ([place]) -Urine drug screen positive for cocaine and marijuana -Patient was counseled extensively regarding substance use and abuse Expressed  understanding  HIV (human immunodeficiency virus infection) ([place]) -Review home medication will resume  -Continue home medication of BIKTARVY -Compliance rate reinforced  Noncompliance with medication regimen -Enforced regarding compliance     Consults called: Cardiology/ Telepulmonary critical care -------------------------------------------------------------------------------------------------------------------------------------------- DVT prophylaxis:  heparin injection 5,000 Units Start: 02/07/23 2200 TED hose Start: 02/07/23 1237 SCDs Start: 02/07/23 1237   Code Status:   Code Status: Full Code   Admission status: Patient will be admitted as Inpatient, with a greater than 2 midnight length of stay. Level of care: ICU   Family Communication:  none at bedside  (The above findings and plan of care has been discussed with patient in detail, the patient expressed understanding and agreement of above plan)   --------------------------------------------------------------------------------------------------------------------------------------------------  Disposition Plan: >3 days Status is: Inpatient Remains inpatient appropriate because: Needing admission to ICU IV Lasix drip and nitroglycerin to aggressively diurese and bring the blood pressure down slowly in ICU setting     ----------------------------------------------------------------------------------------------------------------------------------------------------  Time spent:  37  Min.  Record time was  was spent seeing and evaluating the patient, reviewing all medical records, drawn plan of care. Patient to ICU setting, consulting with cardiology and pulmonary critical care at [facility]  SIGNED: Deatra James, MD, FHM. FAAFP.  - [facility], Pager  (Please use [email or web address] to page/ or secure chat through epic) If 7PM-7AM, please contact night-coverage [email or web address],  02/07/2023, 1:20 PM

## 2023-02-07 NOTE — ED Notes (Signed)
Md Melina Copa aware of critical Troponin of 133.

## 2023-02-07 NOTE — ED Notes (Signed)
Pt aware we need urine sample. Unable to provide at this time

## 2023-02-07 NOTE — Assessment & Plan Note (Signed)
-  Enforced regarding compliance

## 2023-02-07 NOTE — Assessment & Plan Note (Signed)
-  Currently not on any medication -Follow-up with oncology as an outpatient

## 2023-02-07 NOTE — Hospital Course (Addendum)
Richard Cox is a 42 year old male with a history of chronic combined CHF with ejection fraction of 30 to 35%, hypertension, HIV, B-cell lymphoma substance abuse: Including cocaine, tobacco, alcohol  ... Presented today to ED with chief complaint of shortness of breath, markedly elevated blood pressure.,  Stating he has ran out of his medications.  His symptoms started early this morning.  Stating he ran out of Lasix and other medication yesterday. Denies have any headaches, visual changes, or any new focal neurological findings denies any chest pain.   (Patient had a similar presentation admission on October 2023)  ED duration/course: BP on arrival 219/147 Blood pressure (!) 188/138, pulse (!) 122, temperature 98.4 F (36.9 C), temperature source Oral, resp. rate (!) 32,  SpO2 94 %, on 2 L.  CBC: WC 8.7, platelets 145, CMP: Electrolytes normal, BUN 18, creatinine 1.16 CO2 21, calcium 8.8, glucose 104, proBNP 4036,  lactic acid 0.9 CXR: indicated pulmonary edema.  EKG did not show any acute changes.  Troponin:   133, 149,  Patient was started on nitroglycerin drip, Lasix drip.. EDP discussed with cardiology at [facility] feels that patient does not need any urgent intervention, continue diuretics, and treating hypertensive crisis

## 2023-02-07 NOTE — Discharge Instructions (Signed)
  Providers Accepting New Patients in [place], [place]    [facility] [address], [address]  [place], [place] [address] [phone] Accepts most insurances  [facility] [address] [place], [place] [address] [phone] Accepts most insurances  [facility] [place] [address] [place], [place] [address]  [phone] Must meet requirements  [facility] [address]  [place], [place] [address] [phone] Accepts most insurances  [facility] [address]  [place], [place] [address] [phone] Accepts most insurances  [facility] [address]   [place], [place]   [phone] Accepts most insurances  [facility] ([facility])  [address]  [place], [place] [address] [phone] Accepts most insurances     [facility] [address]  [place], [place] [address] [phone] Accepts most insurances  [facility] [address] [place], [place] [address] [phone] option 1 Accepts Medicaid and Uninsured  [facility] [address]  [place], [place] [address] [phone] Accepts most insurances  Tesfaye Fanta, MD [address]  [place], [place] [address] [phone] Accepts most insurances  [facility] [place] [address]  [place], [place] [address] [phone] Accepts most insurances  [facility] [address] [place], [place] [address] [phone] Accepts most insurances  Zack Hall, MD [address] , [place] [address] [phone]  Accepts most insurances                      

## 2023-02-07 NOTE — Assessment & Plan Note (Addendum)
-  On arrival blood pressure 219/147 -Patient has been started on nitroglycerin drip, currently blood pressure is 188/138 -Monitoring closely, continue nitroglycerin drip, will reduce blood pressure Slowly in ICU settings  -Resuming home medication of Coreg 25 mg daily, hydralazine 50 mg p.o. 3 times daily, losartan 50 mg p.o. daily,

## 2023-02-07 NOTE — ED Provider Notes (Signed)
[facility] Provider Note   CSN: [id number] Arrival date & time: 02/07/23  [id number]     History  Chief Complaint  Patient presents with   Shortness of Breath    Richard Cox is a 42 y.o. male.  He has a history of HIV, hypertension, CHF, B-cell lymphoma.  He is here with a complaint of shortness of breath that started earlier this morning.  No real cough or chest pain no fevers or chills.  He said he ran out of his Lasix yesterday and this sometimes happens when he is out of his medication.  He also ran out of his one of his other medications but he does not know the name of it.  The history is provided by the patient.  Shortness of Breath Severity:  Severe Onset quality:  Gradual Duration:  7 hours Timing:  Constant Progression:  Unchanged Chronicity:  Recurrent Relieved by:  Nothing Worsened by:  Activity Ineffective treatments:  Rest Associated symptoms: cough   Associated symptoms: no abdominal pain, no chest pain, no fever, no hemoptysis, no sputum production and no vomiting        Home Medications Prior to Admission medications   Medication Sig Start Date End Date Taking? Authorizing Provider  acetaminophen (TYLENOL) 500 MG tablet Take 1,000 mg by mouth every 6 (six) hours as needed for moderate pain or headache.    [provider]  aspirin 81 MG chewable tablet Chew 1 tablet (81 mg total) by mouth daily. 08/28/21   Geradine Girt, DO  bictegravir-emtricitabine-tenofovir AF (BIKTARVY) 50-200-25 MG TABS tablet Take 1 tablet by mouth daily. 09/08/22   Golden Circle, FNP  carvedilol (COREG) 25 MG tablet Take 1 tablet (25 mg total) by mouth 2 (two) times daily with a meal. 09/01/22   Tat, Shanon Brow, MD  furosemide (LASIX) 80 MG tablet Take 1 tablet by mouth once daily 01/04/23   Golden Circle, FNP  hydrALAZINE (APRESOLINE) 100 MG tablet Take 0.5 tablets (50 mg total) by mouth 3 (three) times daily. 09/05/22   Kathie Dike, MD  losartan (COZAAR) 50 MG tablet Take 1 tablet (50 mg total) by mouth daily. 09/08/22   Golden Circle, FNP      Allergies    Neosporin [neomycin-bacitracin zn-polymyx]    Review of Systems   Review of Systems  Constitutional:  Negative for fever.  Eyes:  Negative for visual disturbance.  Respiratory:  Positive for cough and shortness of breath. Negative for hemoptysis and sputum production.   Cardiovascular:  Negative for chest pain.  Gastrointestinal:  Negative for abdominal pain and vomiting.    Physical Exam Updated Vital Signs BP (!) 190/134 (BP Location: Left Arm)   Pulse (!) 110   Resp (!) 25   Ht 6\' 1"  (1.854 m)   Wt 77.1 kg   SpO2 93%   BMI 22.43 kg/m  Physical Exam Vitals and nursing note reviewed.  Constitutional:      General: He is not in acute distress.    Appearance: He is well-developed.  HENT:     Head: Normocephalic and atraumatic.  Eyes:     Conjunctiva/sclera: Conjunctivae normal.  Cardiovascular:     Rate and Rhythm: Regular rhythm. Tachycardia present.     Heart sounds: No murmur heard. Pulmonary:     Effort: Tachypnea and accessory muscle usage present. No respiratory distress.     Breath sounds: Normal breath sounds.  Abdominal:     Palpations:  Abdomen is soft.     Tenderness: There is no abdominal tenderness.  Musculoskeletal:        General: No swelling.     Cervical back: Neck supple.     Right lower leg: No tenderness. No edema.     Left lower leg: No tenderness. No edema.  Skin:    General: Skin is warm and dry.     Capillary Refill: Capillary refill takes less than 2 seconds.  Neurological:     General: No focal deficit present.     Mental Status: He is alert.     ED Results / Procedures / Treatments   Labs (all labs ordered are listed, but only abnormal results are displayed) Labs Reviewed  BRAIN NATRIURETIC PEPTIDE - Abnormal; Notable for the following components:      Result Value   B Natriuretic Peptide  4,036.0 (*)    All other components within normal limits  BASIC METABOLIC PANEL - Abnormal; Notable for the following components:   CO2 21 (*)    Glucose, Bld 104 (*)    Calcium 8.8 (*)    All other components within normal limits  CBC WITH DIFFERENTIAL/PLATELET - Abnormal; Notable for the following components:   Platelets 145 (*)    All other components within normal limits  RAPID URINE DRUG SCREEN, HOSP PERFORMED - Abnormal; Notable for the following components:   Cocaine POSITIVE (*)    Tetrahydrocannabinol POSITIVE (*)    All other components within normal limits  TROPONIN I (HIGH SENSITIVITY) - Abnormal; Notable for the following components:   Troponin I (High Sensitivity) 133 (*)    All other components within normal limits  TROPONIN I (HIGH SENSITIVITY) - Abnormal; Notable for the following components:   Troponin I (High Sensitivity) 149 (*)    All other components within normal limits  TROPONIN I (HIGH SENSITIVITY) - Abnormal; Notable for the following components:   Troponin I (High Sensitivity) 159 (*)    All other components within normal limits  TROPONIN I (HIGH SENSITIVITY) - Abnormal; Notable for the following components:   Troponin I (High Sensitivity) 160 (*)    All other components within normal limits  SARS CORONAVIRUS 2 BY RT PCR  CULTURE, BLOOD (ROUTINE X 2)  CULTURE, BLOOD (ROUTINE X 2)  MRSA NEXT GEN BY PCR, NASAL  LACTIC ACID, PLASMA  CREATININE, SERUM  MAGNESIUM  PHOSPHORUS  PROTIME-INR  BASIC METABOLIC PANEL  CBC  PROTIME-INR  APTT  BRAIN NATRIURETIC PEPTIDE    EKG EKG Interpretation  Date/Time:  Sunday February 07 2023 13:12:34 EDT Ventricular Rate:  111 PR Interval:  155 QRS Duration: 101 QT Interval:  374 QTC Calculation: 509 R Axis:   70 Text Interpretation: Sinus tachycardia Biatrial enlargement Abnormal R-wave progression, late transition Left ventricular hypertrophy Nonspecific T abnormalities, inferior leads ST elevation, consider  anterolateral injury Prolonged QT interval Artifact in lead(s) I II aVR aVL aVF Confirmed by Aletta Edouard [phone]) on 02/07/2023 1:18:01 PM  Radiology DG Chest Port 1 View  Result Date: 02/07/2023 CLINICAL DATA:  sob EXAM: PORTABLE CHEST - 1 VIEW COMPARISON:  09/04/2022 FINDINGS: Worsening of bilateral diffuse interstitial opacities with scattered small ill-defined airspace infiltrates. Stable cardiomegaly.  Stable right IJ port catheter to the SVC. No effusion. Visualized bones unremarkable. IMPRESSION: Worsening bilateral interstitial and airspace disease. Electronically Signed   By: Lucrezia Europe M.D.   On: 02/07/2023 08:53    Procedures .Critical Care  Performed by: Hayden Rasmussen, MD Authorized by: Aletta Edouard  C, MD   Critical care provider statement:    Critical care time (minutes):  45   Critical care time was exclusive of:  Separately billable procedures and treating other patients   Critical care was necessary to treat or prevent imminent or life-threatening deterioration of the following conditions:  Respiratory failure, cardiac failure and circulatory failure   Critical care was time spent personally by me on the following activities:  Development of treatment plan with patient or surrogate, discussions with consultants, evaluation of patient's response to treatment, examination of patient, obtaining history from patient or surrogate, ordering and performing treatments and interventions, ordering and review of laboratory studies, ordering and review of radiographic studies, pulse oximetry, re-evaluation of patient's condition and review of old charts   I assumed direction of critical care for this patient from another provider in my specialty: no       Medications Ordered in ED Medications  nitroGLYCERIN 50 mg in dextrose 5 % 250 mL (0.2 mg/mL) infusion (13.33 mcg/min Intravenous Rate/Dose Change 02/07/23 1518)  heparin injection 5,000 Units (has no administration in time  range)  sodium chloride flush (NS) 0.9 % injection 3 mL (has no administration in time range)  sodium chloride flush (NS) 0.9 % injection 3 mL (has no administration in time range)  sodium chloride flush (NS) 0.9 % injection 3 mL (has no administration in time range)  0.9 %  sodium chloride infusion (has no administration in time range)  acetaminophen (TYLENOL) tablet 650 mg (has no administration in time range)    Or  acetaminophen (TYLENOL) suppository 650 mg (has no administration in time range)  oxyCODONE (Oxy IR/ROXICODONE) immediate release tablet 5 mg (has no administration in time range)  HYDROmorphone (DILAUDID) injection 0.5-1 mg (has no administration in time range)  zolpidem (AMBIEN) tablet 5 mg (has no administration in time range)  senna-docusate (Senokot-S) tablet 1 tablet (has no administration in time range)  bisacodyl (DULCOLAX) EC tablet 5 mg (has no administration in time range)  sodium phosphate (FLEET) 7-19 GM/118ML enema 1 enema (has no administration in time range)  ondansetron (ZOFRAN) tablet 4 mg (has no administration in time range)    Or  ondansetron (ZOFRAN) injection 4 mg (has no administration in time range)  ipratropium (ATROVENT) nebulizer solution 0.5 mg (has no administration in time range)  levalbuterol (XOPENEX) nebulizer solution 0.63 mg (has no administration in time range)  hydrALAZINE (APRESOLINE) injection 10 mg (has no administration in time range)  labetalol (NORMODYNE) injection 10 mg (has no administration in time range)  bictegravir-emtricitabine-tenofovir AF (BIKTARVY) 50-200-25 MG per tablet 1 tablet (has no administration in time range)  carvedilol (COREG) tablet 25 mg (has no administration in time range)  hydrALAZINE (APRESOLINE) tablet 50 mg (has no administration in time range)  losartan (COZAAR) tablet 50 mg (has no administration in time range)  aspirin chewable tablet 81 mg (has no administration in time range)  furosemide (LASIX) 200  mg in dextrose 5 % 100 mL (2 mg/mL) infusion (10 mg/hr Intravenous New Bag/Given 02/07/23 1533)  nitroGLYCERIN (NITROGLYN) 2 % ointment 1 inch (1 inch Topical Given 02/07/23 0853)  potassium chloride SA (KLOR-CON M) CR tablet 40 mEq (40 mEq Oral Given 02/07/23 1201)  furosemide (LASIX) 120 mg in dextrose 5 % 50 mL IVPB (0 mg Intravenous Stopped 02/07/23 1308)  hydrALAZINE (APRESOLINE) injection 10 mg (10 mg Intravenous Given 02/07/23 1158)  morphine (PF) 4 MG/ML injection 4 mg (4 mg Intravenous Given 02/07/23 1308)  hydrALAZINE (APRESOLINE) injection  10 mg (10 mg Intravenous Given 02/07/23 1305)    ED Course/ Medical Decision Making/ A&P Clinical Course as of 02/07/23 1716  Sun Feb 07, 2023  0858 Chest x-ray interpreted by me as cardiomegaly and increased interstitial markings question fluid versus infection [MB]  0924 Patient's sats were 89% on presentation he is on 2 L nasal cannula  [MB]  1027 Last cardiac echo 10/23 patient had an EF of 30 to 35% [MB]  1042 Discussed with [facility] cardiology Dr. Dellia Cloud.  She did not feel heparin was indicated.  She agrees with current management of of blood pressure control and IV diuresis. [MB]  [id number] Discussed with [facility] hospitalist Dr. Roger Shelter.  He asked if I can review this case with the intensivist at [facility] and if they felt he was okay to stay here to let him know by secure chat. [MB]  1307 Discussed with intensivist Dr. Tacy Learn.  He said if the hospitalist was comfortable managing the patient then he thinks it would be reasonable to keep him here.  If the hospitalist was uncomfortable then he would be willing to accept the patient to [facility] ICU.  I will pass this on to Dr. Roger Shelter.  [MB]    Clinical Course User Index [MB] Hayden Rasmussen, MD                             Medical Decision Making Amount and/or Complexity of Data Reviewed Labs: ordered. Radiology: ordered.  Risk Prescription drug management. Decision regarding hospitalization.   This  patient complains of shortness of breath; this involves an extensive number of treatment Options and is a complaint that carries with it a high risk of complications and morbidity. The differential includes hypertensive emergency, CHF, ACS, PE, pneumothorax, anemia, arrhythmia  I ordered, reviewed and interpreted labs, which included CBC with normal white count normal hemoglobin, chemistries with mildly low bicarb, troponins elevated and slightly rising, BNP elevated, talk screen positive for cocaine and THC I ordered medication IV Lasix IV morphine IV hydralazine IV nitro drip and reviewed PMP when indicated. I ordered imaging studies which included chest x-ray and I independently    visualized and interpreted imaging which showed cardiomegaly and CHF Additional history obtained from patient's family member Previous records obtained and reviewed in epic including recent admission in October for similar presentation I consulted cardiology Dr. Tresa Moore, intensivist Dr. Tacy Learn, [facility] hospitalist Dr. Roger Shelter and discussed lab and imaging findings and discussed disposition.  Cardiac monitoring reviewed, sinus tachycardia Social determinants considered, poor compliance with medication Critical Interventions: Initiation of oxygen and IV blood pressure control, IV diuresis  After the interventions stated above, I reevaluated the patient and found patient to be symptomatically improved although blood pressure still remains elevated Admission and further testing considered, he would benefit from mission to the hospital for continued blood pressure control and IV diuresis.  Patient in agreement with plan for admission.         Final Clinical Impression(s) / ED Diagnoses Final diagnoses:  Hypertensive emergency  Acute respiratory failure with hypoxia (HCC)  Acute on chronic congestive heart failure, unspecified heart failure type [facility])    Rx / DC Orders ED Discharge Orders     None          Hayden Rasmussen, MD 02/07/23 1721

## 2023-02-08 ENCOUNTER — Inpatient Hospital Stay (HOSPITAL_BASED_OUTPATIENT_CLINIC_OR_DEPARTMENT_OTHER): Payer: Medicare Other

## 2023-02-08 DIAGNOSIS — I5043 Acute on chronic combined systolic (congestive) and diastolic (congestive) heart failure: Secondary | ICD-10-CM | POA: Diagnosis not present

## 2023-02-08 DIAGNOSIS — I5031 Acute diastolic (congestive) heart failure: Secondary | ICD-10-CM | POA: Diagnosis not present

## 2023-02-08 DIAGNOSIS — F1410 Cocaine abuse, uncomplicated: Secondary | ICD-10-CM

## 2023-02-08 DIAGNOSIS — I169 Hypertensive crisis, unspecified: Secondary | ICD-10-CM | POA: Diagnosis not present

## 2023-02-08 DIAGNOSIS — J9601 Acute respiratory failure with hypoxia: Secondary | ICD-10-CM | POA: Diagnosis not present

## 2023-02-08 LAB — BASIC METABOLIC PANEL
Anion gap: 11 (ref 5–15)
BUN: 22 mg/dL — ABNORMAL HIGH (ref 6–20)
CO2: 25 mmol/L (ref 22–32)
Calcium: 8.6 mg/dL — ABNORMAL LOW (ref 8.9–10.3)
Chloride: 101 mmol/L (ref 98–111)
Creatinine, Ser: 1.38 mg/dL — ABNORMAL HIGH (ref 0.61–1.24)
GFR, Estimated: >60 mL/min (ref >60)
Glucose, Bld: 116 mg/dL — ABNORMAL HIGH (ref 70–99)
Potassium: 3 mmol/L — ABNORMAL LOW (ref 3.5–5.1)
Sodium: 137 mmol/L (ref 135–145)

## 2023-02-08 LAB — ECHOCARDIOGRAM COMPLETE
Area-P 1/2: 4.1 cm2
Height: 73 in
MV M vel: 0.99 m/s
MV Peak grad: 3.9 mmHg
S' Lateral: 3.7 cm
Weight: 2419.77 oz

## 2023-02-08 LAB — GLUCOSE, CAPILLARY: Glucose-Capillary: 78 mg/dL (ref 70–99)

## 2023-02-08 LAB — BRAIN NATRIURETIC PEPTIDE
B Natriuretic Peptide: 2249 pg/mL — ABNORMAL HIGH (ref 0.0–100.0)
B Natriuretic Peptide: 2032 pg/mL — ABNORMAL HIGH (ref 0.0–100.0)

## 2023-02-08 LAB — MAGNESIUM: Magnesium: 2.6 mg/dL — ABNORMAL HIGH (ref 1.7–2.4)

## 2023-02-08 LAB — CBC
HCT: 37.6 % — ABNORMAL LOW (ref 39.0–52.0)
Hemoglobin: 12.2 g/dL — ABNORMAL LOW (ref 13.0–17.0)
MCH: 30.3 pg (ref 26.0–34.0)
MCHC: 32.4 g/dL (ref 30.0–36.0)
MCV: 93.5 fL (ref 80.0–100.0)
Platelets: 145 10*3/uL — ABNORMAL LOW (ref 150–400)
RBC: 4.02 MIL/uL — ABNORMAL LOW (ref 4.22–5.81)
RDW: 15.1 % (ref 11.5–15.5)
WBC: 4.3 10*3/uL (ref 4.0–10.5)
nRBC: 0 % (ref 0.0–0.2)

## 2023-02-08 LAB — APTT: aPTT: 30 seconds (ref 24–36)

## 2023-02-08 LAB — PROTIME-INR
INR: 1 (ref 0.8–1.2)
Prothrombin Time: 13.5 seconds (ref 11.4–15.2)

## 2023-02-08 MED ORDER — CHLORHEXIDINE GLUCONATE CLOTH 2 % EX PADS
6.0000 | MEDICATED_PAD | Freq: Every day | CUTANEOUS | Status: DC
  Administered 2023-02-08: 6 via TOPICAL

## 2023-02-08 MED ORDER — LOSARTAN POTASSIUM 50 MG PO TABS
50.0000 mg | ORAL_TABLET | Freq: Every day | ORAL | 2 refills | Status: DC
Start: 2023-02-08 — End: 2023-05-24

## 2023-02-08 MED ORDER — POTASSIUM CHLORIDE CRYS ER 20 MEQ PO TBCR
40.0000 meq | EXTENDED_RELEASE_TABLET | Freq: Once | ORAL | Status: AC
  Administered 2023-02-08: 40 meq via ORAL
  Filled 2023-02-08: qty 2

## 2023-02-08 MED ORDER — HYDRALAZINE HCL 25 MG PO TABS
100.0000 mg | ORAL_TABLET | Freq: Three times a day (TID) | ORAL | Status: DC
  Administered 2023-02-08: 100 mg via ORAL
  Filled 2023-02-08: qty 4

## 2023-02-08 MED ORDER — HYDRALAZINE HCL 25 MG PO TABS: 50.0000 mg | ORAL_TABLET | Freq: Three times a day (TID) | ORAL | Status: DC

## 2023-02-08 MED ORDER — HEPARIN SOD (PORK) LOCK FLUSH 100 UNIT/ML IV SOLN
500.0000 [IU] | INTRAVENOUS | Status: DC | PRN
  Filled 2023-02-08: qty 5

## 2023-02-08 MED ORDER — CARVEDILOL 25 MG PO TABS: 25.0000 mg | ORAL_TABLET | Freq: Two times a day (BID) | ORAL | 2 refills | Status: DC

## 2023-02-08 MED ORDER — MUPIROCIN 2 % EX OINT
1.0000 | TOPICAL_OINTMENT | Freq: Two times a day (BID) | CUTANEOUS | Status: DC
  Administered 2023-02-08: 1 via NASAL
  Filled 2023-02-08: qty 22

## 2023-02-08 MED ORDER — ASPIRIN 81 MG PO CHEW: 81.0000 mg | CHEWABLE_TABLET | Freq: Every day | ORAL | 0 refills | Status: DC

## 2023-02-08 MED ORDER — FUROSEMIDE 10 MG/ML IJ SOLN
40.0000 mg | Freq: Two times a day (BID) | INTRAMUSCULAR | Status: DC
  Administered 2023-02-08: 40 mg via INTRAVENOUS
  Filled 2023-02-08: qty 4

## 2023-02-08 MED ORDER — MAGNESIUM SULFATE 2 GM/50ML IV SOLN
2.0000 g | Freq: Once | INTRAVENOUS | Status: AC
  Administered 2023-02-08: 2 g via INTRAVENOUS
  Filled 2023-02-08: qty 50

## 2023-02-08 MED ORDER — FUROSEMIDE 80 MG PO TABS
80.0000 mg | ORAL_TABLET | Freq: Every day | ORAL | 2 refills | Status: DC
Start: 2023-02-08 — End: 2023-05-19

## 2023-02-08 MED ORDER — HYDRALAZINE HCL 100 MG PO TABS
50.0000 mg | ORAL_TABLET | Freq: Three times a day (TID) | ORAL | 1 refills | Status: DC
Start: 2023-02-08 — End: 2023-08-31

## 2023-02-08 NOTE — Care Management CC44 (Signed)
Condition Code 44 Documentation Completed  Patient Details  Name: Richard Cox MRN: [id number] Date of Birth: 12-Dec-1980   Condition Code 44 given:  Yes Patient signature on Condition Code 44 notice:  Yes Documentation of 2 MD's agreement:  Yes Code 44 added to claim:  Yes    Salome Arnt, [place] 02/08/2023, 1:19 PM

## 2023-02-08 NOTE — Consult Note (Addendum)
Cardiology Consultation   Patient ID: Richard Cox MRN: [id number]; DOB: 04-Feb-1981  Admit date: 02/07/2023 Date of Consult: 02/08/2023  PCP:  Merryl Hacker No   [place] Providers Cardiologist:  Rozann Lesches, MD   {   Patient Profile:   Richard Cox is a 42 y.o. male with a hx of HTN, medication noncompliance, h/o tobacco use, chronic combined systolic/diastolic HF, HIV, cocaine/THC use, CKD, and prior admissions for HF/flash pulmonary edema in the setting of medication noncompliance and cocaine+ who is being seen 02/08/2023 for the evaluation of CHF and hypertensive crisis at the request of Dr. Roger Shelter.  History of Present Illness:   Richard Cox has multiple prior admissions. Cardiology saw in 2020 during hospitalization for chest pain found to have acute on chronic heart failure and uncontrolled severe hypertension. LVEF was 40-45%, with severely increased left ventricular wall thickness as well as diastolic dysfunction. LV showed diffuse HK. Cardiomyopathy possibly from hypertensive CM in light of reported noncompliance with medical therapy, but may have HCM. He had mild flat troponin elevation. He was sent home on GDMT. Plan was for outpatient MRI.   He was readmitted in 09/2019 and seen for chest pain, hypertensive urgency in the setting of stopping medications 2 months prior. CT showed moderate pericardial effusion. Repeat echo showed small to moderate pericardial effusion.   Repeat echo 11/2020 showed LVEF 55-60%.   He was seen again in 08/2021 during admission and was seen for acute hypoxic respiratory failure 2/2 flash pulmonary edema, HTN urgency, acute on chronic combined CHF, hypertensive urgency, cocaine +, AKI. Echo showed LVEF 30-35%.  cMRI showed HCM without obstruction, 70mm massive septal hypertrophy in the mid left ventricular septum, LVEF 30-35%.   Admitted again in 08/2022 seen for acute on chronic combined systolic/diastolic HF, HTN, noncompliance, cocaine +, and  demand ischemia. He ran out of lasix 2 days prior. Echo showed LVEF 30-35%, moderately decreased function, severe asymmetric LVH, G3DD, normal RVSF, severely dilated LA, moderately dilated RA. The patient was diuresed and discharged with alsix 80mg  daily, Hydralazine 100mg  TID, Coreg 25mg  BID, Losartan 50mg  daily.   The patient presented to [facility] 3/31 for shortness of breath. He reports it started the day prior to presentation. He reported running out of lasix, Losartan and Coreg the day before. He reports orthopnea. No lower leg edema or chest pain. He reports recent cocaine use and THC use. No alcohol use.   In the ER BP 219/147, pulse 110bpm, RR 25, 93% O2 started on 2L O2. Labs showed BNP 4,036, CO2 21, Calcium 8.8, plt 145k, cocaine +, THC+, HS trop 133>149>160. CXR showed worsening bilateral interstitial and airspace disease. MRSA +. Patient was started on Nitro drip and lasix drip and admitted for further work-up.   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 Authorizing Provider  acetaminophen (TYLENOL) 500 MG tablet Take 1,000 mg by mouth every 6 (six) hours as needed for moderate  pain or headache.   Yes [provider]  aspirin 81 MG chewable tablet Chew 1 tablet (81 mg total) by mouth daily. 08/28/21  Yes Vann, Jessica U, DO  bictegravir-emtricitabine-tenofovir AF (BIKTARVY) 50-200-25 MG TABS tablet Take 1 tablet by mouth daily. 09/08/22  Yes Golden Circle, FNP   carvedilol (COREG) 25 MG tablet Take 1 tablet (25 mg total) by mouth 2 (two) times daily with a meal. 09/01/22  Yes Tat, Shanon Brow, MD  furosemide (LASIX) 80 MG tablet Take 1 tablet by mouth once daily 01/04/23  Yes Golden Circle, FNP  hydrALAZINE (APRESOLINE) 100 MG tablet Take 0.5 tablets (50 mg total) by mouth 3 (three) times daily. 09/05/22  Yes Kathie Dike, MD  losartan (COZAAR) 50 MG tablet Take 1 tablet (50 mg total) by mouth daily. 09/08/22  Yes Golden Circle, FNP    Inpatient Medications: Scheduled Meds:  aspirin  81 mg Oral Daily   bictegravir-emtricitabine-tenofovir AF  1 tablet Oral Daily   carvedilol  25 mg Oral BID WC   Chlorhexidine Gluconate Cloth  6 each Topical Q0600   heparin  5,000 Units Subcutaneous Q8H   hydrALAZINE  50 mg Oral TID   losartan  50 mg Oral Daily   mupirocin ointment  1 Application Nasal BID   sodium chloride flush  3 mL Intravenous Q12H   sodium chloride flush  3 mL Intravenous Q12H   Continuous Infusions:  sodium chloride     furosemide (LASIX) 200 mg in dextrose 5 % 100 mL (2 mg/mL) infusion 10 mg/hr (02/07/23 1533)   nitroGLYCERIN 5 mcg/min (02/08/23 0731)   PRN Meds: sodium chloride, acetaminophen **OR** acetaminophen, bisacodyl, hydrALAZINE, HYDROmorphone (DILAUDID) injection, ipratropium, labetalol, levalbuterol, ondansetron **OR** ondansetron (ZOFRAN) IV, oxyCODONE, senna-docusate, sodium chloride flush, sodium phosphate, zolpidem  Allergies:    Allergies  Allergen Reactions   Motrin [Ibuprofen] Hypertension   Neosporin [Neomycin-Bacitracin Zn-Polymyx] Rash    Social History:   Social History   Socioeconomic History   Marital status: Single    Spouse name: Not on file   Number of children: 0   Years of education: 13   Highest education level: Not on file  Occupational History   Not on file  Tobacco Use   Smoking status: Former    Packs/day: .25    Types: Cigarettes   Smokeless tobacco: Never  Vaping Use   Vaping  Use: Never used  Substance and Sexual Activity   Alcohol use: Not Currently   Drug use: Yes    Types: Marijuana, Cocaine   Sexual activity: Not Currently    Comment: given condoms  Other Topics Concern   Not on file  Social History Narrative   Not on file   Social Determinants of Health   Financial Resource Strain: Low Risk  (09/10/2020)   Overall Financial Resource Strain (CARDIA)    Difficulty of Paying Living Expenses: Not very hard  Food Insecurity: No Food Insecurity (09/05/2022)   Hunger Vital Sign    Worried About Running Out of Food in the Last Year: Never true    Ran Out of Food in the Last Year: Never true  Transportation Needs: No Transportation Needs (09/05/2022)   PRAPARE - Hydrologist (Medical): No    Lack of Transportation (Non-Medical): No  Physical Activity: Insufficiently Active (09/10/2020)   Exercise Vital Sign    Days of Exercise per Week: 5 days    Minutes of Exercise per Session: 20 min  Stress: No  Stress Concern Present (09/10/2020)   [place]    Feeling of Stress : Only a little  Social Connections: Socially Isolated (09/10/2020)   Social Connection and Isolation Panel [NHANES]    Frequency of Communication with Friends and Family: More than three times a week    Frequency of Social Gatherings with Friends and Family: Never    Attends Religious Services: Never    Marine scientist or Organizations: No    Attends Music therapist: Not on file    Marital Status: Never married  Intimate Partner Violence: Not At Risk (09/05/2022)   Humiliation, Afraid, Rape, and Kick questionnaire    Fear of Current or Ex-Partner: No    Emotionally Abused: No    Physically Abused: No    Sexually Abused: No    Family History:    Family History  Problem Relation Age of Onset   Diabetes Mellitus II Mother    Cancer Mother        multiple myeloma    Cancer Father        multiple myeloma   Congestive Heart Failure Brother    Heart Problems Brother    Lupus Brother      ROS:  Please see the history of present illness.   All other ROS reviewed and negative.     Physical Exam/Data:   Vitals:   02/08/23 0600 02/08/23 0630 02/08/23 0700 02/08/23 0730  BP: (!) 165/117 (!) 147/110 (!) 161/106 (!) 158/114  Pulse: 79 83 74 79  Resp: 15 (!) 26 16 17   Temp:      TempSrc:      SpO2: 96% 94% 96% 95%  Weight:      Height:        Intake/Output Summary (Last 24 hours) at 02/08/2023 0741 Last data filed at 02/08/2023 0731 Gross per 24 hour  Intake 1214.44 ml  Output 2450 ml  Net -1235.56 ml      02/07/2023    2:55 PM 02/07/2023    8:23 AM 09/08/2022    9:29 AM  Last 3 Weights  Weight (lbs) 155 lb 6.8 oz 170 lb 162 lb 12.8 oz  Weight (kg) 70.5 kg 77.111 kg 73.846 kg     Body mass index is 20.51 kg/m.  General:  Well nourished, well developed, in no acute distress HEENT: normal Neck: no JVD Vascular: No carotid bruits; Distal pulses 2+ bilaterally Cardiac:  normal S1, S2; RRR; no murmur  Lungs:  mild crackles at bases, trace wheezing Abd: soft, nontender, no hepatomegaly  Ext: no edema Musculoskeletal:  No deformities, BUE and BLE strength normal and equal Skin: warm and dry  Neuro:  CNs 2-12 intact, no focal abnormalities noted Psych:  Normal affect   EKG:  The EKG was personally reviewed and demonstrates:  ST 115bpm LVH with repol and no significant changes Telemetry:  Telemetry was personally reviewed and demonstrates:  ST/NSR HR 80s  Relevant CV Studies:  Echo 08/2022   1. Severe LVH with far more prominent hypertrophy of the septum. There is  no dynamic LVOT gradient. 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 . Left ventricular ejection fraction, by  estimation, is 30 to 35%. The left  ventricle has moderately decreased function. The left ventricle  demonstrates global hypokinesis.  There is severe asymmetric left  ventricular hypertrophy of the septal segment. Left ventricular diastolic  parameters are consistent with Grade III diastolic  dysfunction (restrictive).  2. Right ventricular systolic function is normal. The right ventricular  size is normal. There is normal pulmonary artery systolic pressure.   3. Left atrial size was severely dilated.   4. Right atrial size was moderately dilated.   5. The mitral valve is normal in structure. No evidence of mitral valve  regurgitation. No evidence of mitral stenosis.   6. The aortic valve has an indeterminant number of cusps. Aortic valve  regurgitation is not visualized. No aortic stenosis is present.   7. The inferior vena cava is normal in size with greater than 50%  respiratory variability, suggesting right atrial pressure of 3 mmHg.   cMRI 08/2021   IMPRESSION: 1. 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 31 mm massive septal hypertrophy in the mid left ventricular septum. Significant diffuse delayed myocardial enhancement appears >15% of myocardial mass. ECV 36%, nonspecific elevated. T2 is 59 msec in area of maximal wall thickness. Findings of >30 mm wall thickness and elevated T2 signal suggest higher risk phenotype HCM.   2. Severely reduced LV systolic function by visual estimate, LVEF 30-35% with global hypokinesis.   2.  Mildly reduced RV function with normal RV size.   4. ECV value and inversion time myocardial nulling kinetics suggest against a diagnosis of cardiac amyloidosis.    Echo 08/2021  1. There is severe asymmetric hypertrophy of the septum up to 29 mm on  this study. The PW is severely hypertrophied up to 20 mm. There is no SAM  of the mitral valve. There is no LVOT obstruction. Findings could  represent hypertrophic cardiomyopathy  versus other infiltrative process such as cardiac amyloidosis. Would  recommend cardiac  MRI for clarification. EF appears to be mildly reduced  on this study, EF 45-50%. Left ventricular ejection fraction, by  estimation, is 45 to 50%. The left ventricle  has mildly decreased function. The left ventricle demonstrates global  hypokinesis. There is severe asymmetric left ventricular hypertrophy of  the septal segment. Left ventricular diastolic parameters are consistent  with Grade II diastolic dysfunction  (pseudonormalization).   2. Right ventricular systolic function is normal. The right ventricular  size is normal. Tricuspid regurgitation signal is inadequate for assessing  PA pressure.   3. Left atrial size was moderately dilated.   4. The mitral valve is grossly normal. No evidence of mitral valve  regurgitation. No evidence of mitral stenosis.   5. The aortic valve is tricuspid. Aortic valve regurgitation is not  visualized. No aortic stenosis is present.   Laboratory Data:  High Sensitivity Troponin:   Recent Labs  Lab 02/07/23 0858 02/07/23 1036 02/07/23 1354 02/07/23 1542  TROPONINIHS 133* 149* 159* 160*     Chemistry Recent Labs  Lab 02/07/23 0858 02/07/23 1057 02/08/23 0319  NA 137  --  137  K 3.6  --  3.0*  CL 105  --  101  CO2 21*  --  25  GLUCOSE 104*  --  116*  BUN 18  --  22*  CREATININE 1.16 1.18 1.38*  CALCIUM 8.8*  --  8.6*  MG  --  2.0  --   GFRNONAA >60 >60 >60  ANIONGAP 11  --  11    No results for input(s): "PROT", "ALBUMIN", "AST", "ALT", "ALKPHOS", "BILITOT" in the last 168 hours. Lipids No results for input(s): "CHOL", "TRIG", "HDL", "LABVLDL", "LDLCALC", "CHOLHDL" in the last 168 hours.  Hematology Recent Labs  Lab 02/07/23 0858 02/08/23 0319  WBC 8.7 4.3  RBC 4.31 4.02*  HGB 13.1 12.2*  HCT 39.9 37.6*  MCV 92.6 93.5  MCH 30.4 30.3  MCHC 32.8 32.4  RDW 14.9 15.1  PLT 145* 145*   Thyroid No results for input(s): "TSH", "FREET4" in the last 168 hours.  BNP Recent Labs  Lab 02/07/23 0858 02/08/23 0319  BNP  4,036.0* 2,249.0*    DDimer No results for input(s): "DDIMER" in the last 168 hours.   Radiology/Studies:  DG Chest Port 1 View  Result Date: 02/07/2023 CLINICAL DATA:  sob EXAM: PORTABLE CHEST - 1 VIEW COMPARISON:  09/04/2022 FINDINGS: Worsening of bilateral diffuse interstitial opacities with scattered small ill-defined airspace infiltrates. Stable cardiomegaly.  Stable right IJ port catheter to the SVC. No effusion. Visualized bones unremarkable. IMPRESSION: Worsening bilateral interstitial and airspace disease. Electronically Signed   By: Lucrezia Europe M.D.   On: 02/07/2023 08:53     Assessment and Plan:   Acute on chronic combined CHF HCM - presented with SOB in the setting of running out of cardiac meds  - BNP >4000 and CXR with pulmonary edema - PTA lasix 80mg  daily, Coreg 25mg  BID, Losartan 50mg  daily, Hydralazine 100mg  TID - started on IV lasix drip - Net -1.2L - labs showing dehydration with Scr 1.16>1.38, BUN 22.  - supplement K - echo 08/2022 showed LVEF 30-35%, G3DD, severe LVH - cMRI showed hypertrophic CM without obstruction, LVEF 30-35%, mildly reduced RVSF - echo re-ordered - PTA lasix 80mg  daily. Home meds restarted - does not appear significantly volume up on exam. May be able to restart PTA lasix tomorrow. We want to avoid dehydration with HCM  Hypertensive Crisis - BP 219/147 started on nitro drip - PTA Coreg 25mg  BID, hydralazine 50mg  TID, Losartan 50mg  daily - Bps improving - off nitro drip  Acute respiratory failure with hypoxia - due CHF/pulmonary edema from hypertensive crisis - off supplemental O2  Elevated troponin - troponin elevated to 160, overall flat trend and not consistent with ACS - h/o of low EF with no prior ischemic work-up. CM suspected from hypertension, noncompliance and HCM, also with cocaine history - no chest pain reported - repeat echo as above - EKG with no ischemic changes - continue ASA 81mg  daily  Polysubstance abuse  -  reports THC and cocaine use - cessation encouraged  Social issues - h/o of noncompliance - does not have insurance or PCP, consider social work consult   For questions or updates, please contact [place] Please consult [email or web address] for contact info under    Signed, Cadence Arlyss Repress  02/08/2023 7:41 AM    Attending attestation  Patient seen and independently examined with Cadence Furth, PA-C. We discussed all aspects of the encounter. I agree with the assessment and plan as stated above.  42 year old M known to have HCM with cardiomyopathy LVEF 35%, polysubstance abuse is currently admitted to the hospitalist team for the management of cocaine induced hypertensive emergency and heart failure exacerbation. He was started on Lasix and NTG drips with normalization of blood pressures. He denies any symptoms and wants to go home. Physical examination is remarkable for, patient not in acute respite distress, HEENT normal, no JVD, clear lungs, S1-S2 heard, no S3, no pitting edema in bilateral lower EXTR is, AAOx3.  Agree with discontinuing Lasix and NTG drips. Resume home medications except Lasix. Lasix can be taken as needed in the outpatient setting.  Patient educated about cocaine cessation, he voiced understanding and is motivated to quit. Cardiology follow-up in  2 weeks upon discharge.  [facility] will sign off.   Medication Recommendations: Lasix 40 mg as needed daily, carvedilol 25 mg twice daily, hydralazine 50 mg 3 times daily, losartan 50 mg once daily Other recommendations (labs, testing, etc): None Follow up as an outpatient: Follow-up with cardiology APP in 2 weeks upon discharge  I have spent a total of 50 minutes with patient reviewing chart , telemetry, EKGs, labs and examining patient as well as establishing an assessment and plan that was discussed with the patient.  > 50% of time was spent in direct patient care.    Claretha Townshend Fidel Levy, MD [place]  11:32 AM

## 2023-02-08 NOTE — Discharge Summary (Addendum)
Physician Discharge Summary   Patient: Richard Cox MRN: [id number] DOB: 08-Jun-1981  Admit date:     02/07/2023  Discharge date: 02/08/23  Discharge Physician: Deatra James   PCP: Pcp, No   Recommendations at discharge:    Follow-up with PCP in 1-2 weeks   Follow-up with cardiology in 1-2 weekss Monitor your weight daily, any increase in your dry weight 3-5 pounds notify your cardiologist (monitor your symptoms chest pain or shortness of breath-[address] cardiologist) Street drugs including marijuana, cocaine, avoid alcohol and alcohol products Recommending notify your PCP and cardiology a month before running out of medication  Discharge Diagnoses: Principal Problem:   Acute on chronic combined systolic and diastolic CHF (congestive heart failure) Active Problems:   Hypertensive crisis   Acute respiratory failure with hypoxia   HTN (hypertension)   Noncompliance with medication regimen   HIV (human immunodeficiency virus infection)   Substance abuse   Tobacco abuse   High grade B-cell lymphoma   Elevated troponin   CHF exacerbation   Cocaine abuse  Resolved Problems:   CHF (congestive heart failure)  Hospital Course: Richard Cox is a 42 year old male with a history of chronic combined CHF with ejection fraction of 30 to 35%, hypertension, HIV, B-cell lymphoma substance abuse: Including cocaine, tobacco, alcohol  ... Presented today to ED with chief complaint of shortness of breath, markedly elevated blood pressure.,  Stating he has ran out of his medications.  His symptoms started early this morning.  Stating he ran out of Lasix and other medication yesterday. Denies have any headaches, visual changes, or any new focal neurological findings denies any chest pain.   (Patient had a similar presentation admission on October 2023)  ED duration/course: BP on arrival 219/147 Blood pressure (!) 188/138, pulse (!) 122, temperature 98.4 F (36.9 C), temperature source Oral, resp. rate  (!) 32,  SpO2 94 %, on 2 L.  CBC: WC 8.7, platelets 145, CMP: Electrolytes normal, BUN 18, creatinine 1.16 CO2 21, calcium 8.8, glucose 104, proBNP 4036,  lactic acid 0.9 CXR: indicated pulmonary edema.  EKG did not show any acute changes.  Troponin:   133, 149,  Patient was started on nitroglycerin drip, Lasix drip.. EDP discussed with cardiology at [facility] feels that patient does not need any urgent intervention, continue diuretics, and treating hypertensive crisis  ===================================================================   * Acute on chronic combined systolic and diastolic CHF (congestive heart failure) -Was admitted in ICU setting on IV Lasix drip and nitroglycerin drip.  Both has been discontinued this morning 02/08/2023  - Lasix drip>>> weaning off the drip >>> initiating Lasix 40 mg IV twice daily Switching to home regimen of Lasix 80 mg daily per cardiology recommendations    -Monitoring I's and O's closely Filed Weights   02/07/23 0823 02/07/23 1455 02/08/23 0722  Weight: 77.1 kg 70.5 kg 68.6 kg    Intake/Output Summary (Last 24 hours) at 02/08/2023 1015 Last data filed at 02/08/2023 [id number] Gross per 24 hour  Intake 1474.76 ml  Output 2900 ml  Net -1425.24 ml    -Monitored his weight daily, strict I's and O's EKGs were reviewed by cardiology yesterday at [facility],  -Last echo 09/05/2022: Hypertrophic nonobstructive cardiomyopathy. EJF 30 - 35%.   LV moderately decreased function. LV global hypokinesis. Severe asymmetric left ventricular hypertrophy of the septal segment. Left ventricular diastolic parameters are consistent with Grade III diastolic dysfunction (restrictive). Right Ventricle: The right ventricular size is normal.     -02/08/2023 2D echocardiogram >>>>  by estimation, is 25 to 30%. The  left ventricle has severely decreased function. The left ventricle  demonstrates global hypokinesis. There is severe asymmetric left  ventricular hypertrophy of  the septal segment,  also involving RV. Consistent with hypertrophic cardiomyopathy, although  no obvious LVOT gradient to suggest obstruction at rest. Left ventricular  diastolic parameters are consistent with Grade I diastolic dysfunction  (impaired relaxation).   2. Right ventricular systolic function is normal    Hypertensive crisis -On arrival blood pressure 219/147 >>> much improved--back to normal on oral regimen -Off nitroglycerin drip>>> tapered off -Reducing BP slowly in ICU setting   -Resuming home medication of Coreg 25 mg daily, hydralazine 50 mg p.o. 3 times daily, losartan 50 mg p.o. daily, 80 mg daily  Acute respiratory failure with hypoxia -Resolved -Likely was due to hypertensive crisis -on arrival required 2 L    HTN (hypertension) -Compliant with home medication -Reviewed and will resume home medication  Coreg 25 mg daily, hydralazine 50 mg p.o. 3 times daily, losartan 50 mg p.o. daily,  Cocaine abuse - Advised cessation of substance abuse   Elevated troponin -Likely due to hypertensive crisis, tachyarrhythmia -Ischemic demand -EKG tachyarrhythmia negative any ST elevation depression per EDP -EP has discussed the case with cardiology on-call at [facility] Who is recommending current management of blood pressure and hypertensive crisis, diuretics  High grade B-cell lymphoma -Currently not on any medication -Follow-up with oncology as an outpatient  Tobacco abuse -Counseled against smoking use and abuse -NicoDerm patch provided  Substance abuse -Urine drug screen positive for cocaine and marijuana -Patient was counseled extensively regarding substance use and abuse Expressed understanding  HIV (human immunodeficiency virus infection) -Review home medication will resume  -Continue home medication of BIKTARVY -Compliance rate reinforced  Noncompliance with medication regimen -Enforced regarding compliance      Consultants:  Cardiology/pulmonary critical care Procedures performed: 2D echocardiogram Disposition: Home Diet recommendation:  Discharge Diet Orders (From admission, onward)     Start     Ordered   02/08/23 0000  Diet - low sodium heart healthy        02/08/23 1245           Cardiac diet DISCHARGE MEDICATION: Allergies as of 02/08/2023       Reactions   Motrin [ibuprofen] Hypertension   Neosporin [neomycin-bacitracin Zn-polymyx] Rash        Medication List     TAKE these medications    acetaminophen 500 MG tablet Commonly known as: TYLENOL Take 1,000 mg by mouth every 6 (six) hours as needed for moderate pain or headache.   aspirin 81 MG chewable tablet Chew 1 tablet (81 mg total) by mouth daily.   Biktarvy 50-200-25 MG Tabs tablet Generic drug: bictegravir-emtricitabine-tenofovir AF Take 1 tablet by mouth daily.   carvedilol 25 MG tablet Commonly known as: COREG Take 1 tablet (25 mg total) by mouth 2 (two) times daily with a meal.   furosemide 80 MG tablet Commonly known as: LASIX Take 1 tablet (80 mg total) by mouth daily.   hydrALAZINE 100 MG tablet Commonly known as: APRESOLINE Take 0.5 tablets (50 mg total) by mouth 3 (three) times daily.   losartan 50 MG tablet Commonly known as: COZAAR Take 1 tablet (50 mg total) by mouth daily.        Discharge Exam: Filed Weights   02/07/23 0823 02/07/23 1455 02/08/23 0722  Weight: 77.1 kg 70.5 kg 68.6 kg        General:  AAO x 3,  cooperative, no distress;   HEENT:  Normocephalic, PERRL, otherwise with in Normal limits   Neuro:  CNII-XII intact. , normal motor and sensation, reflexes intact   Lungs:   Clear to auscultation BL, Respirations unlabored,  No wheezes / crackles  Cardio:    S1/S2, RRR, No murmure, No Rubs or Gallops   Abdomen:  Soft, non-tender, bowel sounds active all four quadrants, no guarding or peritoneal signs.  Muscular  skeletal:  Limited exam -global generalized weaknesses - in bed, able  to move all 4 extremities,   2+ pulses,  symmetric, No pitting edema  Skin:  Dry, warm to touch, negative for any Rashes,  Wounds: Please see nursing documentation          Condition at discharge: good  The results of significant diagnostics from this hospitalization (including imaging, microbiology, ancillary and laboratory) are listed below for reference.   Imaging Studies: DG Chest Port 1 View  Result Date: 02/07/2023 CLINICAL DATA:  sob EXAM: PORTABLE CHEST - 1 VIEW COMPARISON:  09/04/2022 FINDINGS: Worsening of bilateral diffuse interstitial opacities with scattered small ill-defined airspace infiltrates. Stable cardiomegaly.  Stable right IJ port catheter to the SVC. No effusion. Visualized bones unremarkable. IMPRESSION: Worsening bilateral interstitial and airspace disease. Electronically Signed   By: Lucrezia Europe M.D.   On: 02/07/2023 08:53    Microbiology: Results for orders placed or performed during the hospital encounter of 02/07/23  SARS Coronavirus 2 by RT PCR (hospital order, performed in [facility] hospital lab) *cepheid single result test* Anterior Nasal Swab     Status: None   Collection Time: 02/07/23  8:54 AM   Specimen: Anterior Nasal Swab  Result Value Ref Range Status   SARS Coronavirus 2 by RT PCR NEGATIVE NEGATIVE Final    Comment: (NOTE) SARS-CoV-2 target nucleic acids are NOT DETECTED.  The SARS-CoV-2 RNA is generally detectable in upper and lower respiratory specimens during the acute phase of infection. The lowest concentration of SARS-CoV-2 viral copies this assay can detect is 250 copies / mL. A negative result does not preclude SARS-CoV-2 infection and should not be used as the sole basis for treatment or other patient management decisions.  A negative result may occur with improper specimen collection / handling, submission of specimen other than nasopharyngeal swab, presence of viral mutation(s) within the areas targeted by this assay, and  inadequate number of viral copies (<250 copies / mL). A negative result must be combined with clinical observations, patient history, and epidemiological information.  Fact Sheet for Patients:   https://www.patel.info/  Fact Sheet for Healthcare Providers: https://hall.com/  This test is not yet approved or  cleared by the [place] FDA and has been authorized for detection and/or diagnosis of SARS-CoV-2 by FDA under an Emergency Use Authorization (EUA).  This EUA will remain in effect (meaning this test can be used) for the duration of the COVID-19 declaration under Section 564(b)(1) of the Act, 21 U.S.C. section 360bbb-3(b)(1), unless the authorization is terminated or revoked sooner.  Performed at [facility], [address], [place], [place] [address]   Culture, blood (routine x 2)     Status: None (Preliminary result)   Collection Time: 02/07/23  9:06 AM   Specimen: BLOOD LEFT FOREARM  Result Value Ref Range Status   Specimen Description BLOOD LEFT FOREARM  Final   Special Requests   Final    BOTTLES DRAWN AEROBIC AND ANAEROBIC Blood Culture adequate volume   Culture   Final  NO GROWTH < 24 HOURS Performed at [facility], [address] [place], [place] [address]    Report Status PENDING  Incomplete  Culture, blood (routine x 2)     Status: None (Preliminary result)   Collection Time: 02/07/23  9:32 AM   Specimen: BLOOD  Result Value Ref Range Status   Specimen Description BLOOD LEFT ANTECUBITAL  Final   Special Requests   Final    BOTTLES DRAWN AEROBIC AND ANAEROBIC Blood Culture adequate volume   Culture   Final    NO GROWTH < 24 HOURS Performed at [facility], [address] [place], [place] [address]    Report Status PENDING  Incomplete  MRSA Next Gen by PCR, Nasal     Status: Abnormal   Collection Time: 02/07/23  3:44 PM   Specimen: Nasal Mucosa; Nasal Swab  Result Value Ref Range Status   MRSA by PCR Next Gen  DETECTED (A) NOT DETECTED Corrected    Comment: RESULT CALLED TO, READ BACK BY AND VERIFIED WITH: ISSACS, T AT 1937 ON 02/07/23 BY SMN.        The GeneXpert MRSA Assay (FDA approved for NASAL specimens only), is one component of a comprehensive MRSA colonization surveillance program. It is not intended to diagnose MRSA infection nor to guide or monitor treatment for MRSA infections. Performed at [facility], [address] [place], [place] [address] CORRECTED ON 03/31 AT 1940: PREVIOUSLY REPORTED AS RESULT CALLED TO, READ BACK BY AND VERIFIED WITH: ISSACS, T AT 1937 ON 02/07/23 BY SMN.        The GeneXpert MRSA Assay (FDA approved for NASAL specimens only), is one component of a comprehensive MRSA  colonization surveillance program. It is not intended to diagnose MRSA infection nor to guide or monitor treatment for MRSA infections.     Labs: CBC: Recent Labs  Lab 02/07/23 0858 02/08/23 0319  WBC 8.7 4.3  NEUTROABS 6.9  --   HGB 13.1 12.2*  HCT 39.9 37.6*  MCV 92.6 93.5  PLT 145* [id number]*   Basic Metabolic Panel: Recent Labs  Lab 02/07/23 0858 02/07/23 1057 02/08/23 0319 02/08/23 0844  NA 137  --  137  --   K 3.6  --  3.0*  --   CL 105  --  101  --   CO2 21*  --  25  --   GLUCOSE 104*  --  116*  --   BUN 18  --  22*  --   CREATININE 1.16 1.18 1.38*  --   CALCIUM 8.8*  --  8.6*  --   MG  --  2.0  --  2.6*  PHOS  --  3.2  --   --    Liver Function Tests: No results for input(s): "AST", "ALT", "ALKPHOS", "BILITOT", "PROT", "ALBUMIN" in the last 168 hours. CBG: Recent Labs  Lab 02/08/23 0805  GLUCAP 78    Discharge time spent: greater than 30 minutes.  Signed: Deatra James, MD [facility] 02/08/2023

## 2023-02-08 NOTE — Assessment & Plan Note (Signed)
-   Advised cessation of substance abuse

## 2023-02-08 NOTE — Care Management Obs Status (Signed)
[facility] NOTIFICATION   Patient Details  Name: Joziah Glasier MRN: [id number] Date of Birth: 1981-05-21   Medicare Observation Status Notification Given:  Yes    Salome Arnt, LCSW 02/08/2023, 1:19 PM

## 2023-02-08 NOTE — Progress Notes (Signed)
  Echocardiogram 2D Echocardiogram has been performed.  Richard Cox 02/08/2023, 11:30 AM

## 2023-02-08 NOTE — TOC Transition Note (Signed)
Transition of Care [facility]) - CM/SW Discharge Note   Patient Details  Name: Richard Cox MRN: [id number] Date of Birth: [birth year]  [facility] CM/SW Contact:  Salome Arnt, LCSW Phone Number: 02/08/2023, 12:59 PM   Clinical Narrative: Pt d/c today. TOC received consult for PCP/home health/DME. Pt reports he is having difficulty getting medications because he does not have a PCP. PCP list provided to pt. Per MD, no other home needs at this time.       Final next level of care: Home/Self Care Barriers to Discharge: Barriers Resolved   Patient Goals and CMS Choice      Discharge Placement                      Patient and family notified of of transfer: 02/08/23  Discharge Plan and Services Additional resources added to the After Visit Summary for                                       Social Determinants of Health (SDOH) Interventions SDOH Screenings   Food Insecurity: No Food Insecurity (09/05/2022)  Housing: Low Risk  (02/07/2023)  Transportation Needs: No Transportation Needs (09/05/2022)  Utilities: Not At Risk (09/05/2022)  Alcohol Screen: Low Risk  (10/14/2020)  Depression (PHQ2-9): Low Risk  (09/08/2022)  Financial Resource Strain: Low Risk  (09/10/2020)  Physical Activity: Insufficiently Active (09/10/2020)  Social Connections: Socially Isolated (09/10/2020)  Stress: No Stress Concern Present (09/10/2020)  Tobacco Use: Medium Risk (02/07/2023)     Readmission Risk Interventions    08/31/2022    8:43 AM 08/27/2021    3:31 PM  Readmission Risk Prevention Plan  Transportation Screening Complete Complete  Medication Review (RN Care Manager) Complete   PCP or Specialist appointment within 3-5 days of discharge  Not Complete  PCP/Specialist Appt Not Complete comments  Left message with Dr Posey Pronto, waiting for call back.  [place] or Home Care Consult Complete Complete  SW Recovery Care/Counseling Consult Complete Complete  Palliative  Care Screening Not Applicable Not Applicable  Skilled Nursing Facility Not Applicable Not Applicable

## 2023-02-08 NOTE — Consult Note (Signed)
NAME:  Richard Cox, MRN:  [id number], DOB:  1981-04-01, LOS: 1 ADMISSION DATE:  02/07/2023, CONSULTATION DATE: 02/07/2023 REFERRING MD:  Skipper Cliche A , CHIEF COMPLAINT: Shortness of breath  History of Present Illness:  42 year old male with chronic combined systolic and diastolic congestive heart failure, hypertension, B-cell lymphoma in remission, HIV and polysubstance abuse who was brought into the emergency department at [facility] with complaint of increasing shortness of breath since last night.  Patient was noted to have systolic blood pressure of 194/134, stating that he had not taken his antihypertensive meds this morning.  Patient denies chest pain, nausea, vomiting, headache or other complaints  Pertinent  Medical History   Past Medical History:  Diagnosis Date   Acute respiratory failure with hypoxia ([place]) 08/24/2021   CHF (congestive heart failure) ([facility])    Endotracheally intubated 08/24/2021   Essential hypertension    HIV infection ([place])    Lymphoma ([place]) 08/28/2020   Noncompliance with medication regimen    Secondary cardiomyopathy ([place])      Significant Hospital Events: Including procedures, antibiotic start and stop dates in addition to other pertinent events     Interim History / Subjective:  No overnight issues Patient is off nitroglycerin infusion, blood pressure is better controlled Currently on room air  Objective   Blood pressure 120/69, pulse 74, temperature 98.1 F (36.7 C), temperature source Oral, resp. rate 14, height 6\' 1"  (1.854 m), weight 68.6 kg, SpO2 94 %.        Intake/Output Summary (Last 24 hours) at 02/08/2023 0944 Last data filed at 02/08/2023 [id number] Gross per 24 hour  Intake 1474.76 ml  Output 2900 ml  Net -1425.24 ml   Filed Weights   02/07/23 0823 02/07/23 1455 02/08/23 0722  Weight: 77.1 kg 70.5 kg 68.6 kg    Examination: Physical exam: General: Middle-age male, lying on the bed HEENT: [place]/AT, eyes anicteric.  moist  mucus membranes Neuro: Alert, awake following commands Heart: Regular rate and rhythm, seen on telemetry Skin: No rash   Labs and images were reviewed   Resolved Hospital Problem list     Assessment & Plan:  Acute hypoxic respiratory failure due to flash pulmonary edema, resolved Acute on chronic biventricular combined HF Hypertensive emergency, improved Demand cardiac ischemia HIV Polysubstance abuse  Patient is on room air now Recommend transitioning insulin infusion to home dose Lasix Monitor intake and output Off nitroglycerin infusion Restarted back on home medication including Coreg, hydralazine and losartan Blood pressure is better controlled Troponins remain flat EKG showed LVH, no ischemic changes at this time Continue antiretroviral medications Watch for signs of withdrawal  PCCM will sign off at this time, please call with questions  Best Practice (right click and "Reselect all SmartList Selections" daily)   Per primary team Labs   CBC: Recent Labs  Lab 02/07/23 0858 02/08/23 0319  WBC 8.7 4.3  NEUTROABS 6.9  --   HGB 13.1 12.2*  HCT 39.9 37.6*  MCV 92.6 93.5  PLT 145* 145*    Basic Metabolic Panel: Recent Labs  Lab 02/07/23 0858 02/07/23 1057 02/08/23 0319 02/08/23 0844  NA 137  --  137  --   K 3.6  --  3.0*  --   CL 105  --  101  --   CO2 21*  --  25  --   GLUCOSE 104*  --  116*  --   BUN 18  --  22*  --   CREATININE 1.16 1.18 1.38*  --  CALCIUM 8.8*  --  8.6*  --   MG  --  2.0  --  2.6*  PHOS  --  3.2  --   --    GFR: Estimated Creatinine Clearance: 69.7 mL/min (A) (by C-G formula based on SCr of 1.38 mg/dL (H)). Recent Labs  Lab 02/07/23 0858 02/07/23 0901 02/08/23 0319  WBC 8.7  --  4.3  LATICACIDVEN  --  0.9  --     Liver Function Tests: No results for input(s): "AST", "ALT", "ALKPHOS", "BILITOT", "PROT", "ALBUMIN" in the last 168 hours. No results for input(s): "LIPASE", "AMYLASE" in the last 168 hours. No results  for input(s): "AMMONIA" in the last 168 hours.  ABG    Component Value Date/Time   PHART 7.422 08/25/2021 0410   PCO2ART 41.6 08/25/2021 0410   PO2ART 134 (H) 08/25/2021 0410   HCO3 27.0 08/25/2021 0410   TCO2 28 08/25/2021 0410   ACIDBASEDEF 1.6 08/24/2021 0500   O2SAT 99.0 08/25/2021 0410     Coagulation Profile: Recent Labs  Lab 02/07/23 1057 02/08/23 0319  INR 1.1 1.0    Cardiac Enzymes: No results for input(s): "CKTOTAL", "CKMB", "CKMBINDEX", "TROPONINI" in the last 168 hours.  HbA1C: Hgb A1c MFr Bld  Date/Time Value Ref Range Status  08/25/2021 03:36 AM 5.1 4.8 - 5.6 % Final    Comment:    (NOTE) Pre diabetes:          5.7%-6.4%  Diabetes:              >6.4%  Glycemic control for   <7.0% adults with diabetes   09/18/2019 12:36 AM 5.5 4.8 - 5.6 % Final    Comment:    (NOTE) Pre diabetes:          5.7%-6.4% Diabetes:              >6.4% Glycemic control for   <7.0% adults with diabetes     CBG: Recent Labs  Lab 02/08/23 0805  GLUCAP 53     Jacky Kindle, MD [facility] See Amion for pager If no response to pager, please call [phone] until 7pm After 7pm, Please call E-link [phone]

## 2023-02-08 NOTE — Progress Notes (Signed)
PT Cancellation Note  Patient Details Name: Richard Cox MRN: [id number] DOB: Jan 25, 1981   Cancelled Treatment:    Reason Eval/Treat Not Completed: PT screened, no needs identified, will sign off; Patient discharging today, Spoke with social work who messaged MD if PT eval was still needed and he replied that it was not. Will sign off.   1:08 PM, 02/08/23 Mearl Latin PT, DPT Physical Therapist at [facility]

## 2023-02-08 NOTE — Progress Notes (Signed)
Pt Port de-accessed and Heparin locked.

## 2023-02-08 NOTE — Progress Notes (Signed)
PROGRESS NOTE    Patient: Richard Cox                            PCP: Pcp, No                    DOB: 04/22/1981            DOA: 02/07/2023 [id number]             DOS: 02/08/2023, 10:20 AM   LOS: 1 day   Date of Service: The patient was seen and examined on 02/08/2023  Subjective:   The patient was seen and examined this morning. Blood pressure continues to improve on Lasix drip diuresing No issues overnight... Denies any chest pain headaches visual changes or focal neurological findings Off the nitro drip, continue p.o. Lasix drip  Brief Narrative:   Richard Cox is a 42 year old male with a history of chronic combined CHF with ejection fraction of 30 to 35%, hypertension, HIV, B-cell lymphoma substance abuse: Including cocaine, tobacco, alcohol  ... Presented today to ED with chief complaint of shortness of breath, markedly elevated blood pressure.,  Stating he has ran out of his medications.  His symptoms started early this morning.  Stating he ran out of Lasix and other medication yesterday. Denies have any headaches, visual changes, or any new focal neurological findings denies any chest pain.   (Patient had a similar presentation admission on October 2023)  ED duration/course: BP on arrival 219/147 Blood pressure (!) 188/138, pulse (!) 122, temperature 98.4 F (36.9 C), temperature source Oral, resp. rate (!) 32,  SpO2 94 %, on 2 L.  CBC: WC 8.7, platelets 145, CMP: Electrolytes normal, BUN 18, creatinine 1.16 CO2 21, calcium 8.8, glucose 104, proBNP 4036,  lactic acid 0.9 CXR: indicated pulmonary edema.  EKG did not show any acute changes.  Troponin:   133, 149,  Patient was started on nitroglycerin drip, Lasix drip.. EDP discussed with cardiology at [facility] feels that patient does not need any urgent intervention, continue diuretics, and treating hypertensive crisis    Assessment & Plan:   Principal Problem:   Acute on chronic combined systolic and diastolic CHF  (congestive heart failure) Active Problems:   Hypertensive crisis   Acute respiratory failure with hypoxia   HTN (hypertension)   Noncompliance with medication regimen   HIV (human immunodeficiency virus infection)   Substance abuse   Tobacco abuse   High grade B-cell lymphoma   Elevated troponin   CHF exacerbation   Cocaine abuse     Assessment and Plan: * Acute on chronic combined systolic and diastolic CHF (congestive heart failure) -Will continue to monitor on ICU -Continue Lasix drip>>> weaning off the drip >>> initiating Lasix 40 mg IV twice daily -Monitoring I's and O's closely Filed Weights   02/07/23 0823 02/07/23 1455 02/08/23 0722  Weight: 77.1 kg 70.5 kg 68.6 kg    Intake/Output Summary (Last 24 hours) at 02/08/2023 1015 Last data filed at 02/08/2023 [id number] Gross per 24 hour  Intake 1474.76 ml  Output 2900 ml  Net -1425.24 ml    -Monitoring daily weight -Reds clip -eating daily -Appreciate cardiology following closely EKGs were reviewed by cardiology yesterday at [facility],  -Last echo 09/05/2022: Hypertrophic nonobstructive cardiomyopathy. EJF 30 - 35%.   LV moderately decreased function. LV global hypokinesis. Severe asymmetric left ventricular hypertrophy of the septal segment. Left ventricular diastolic parameters are consistent with Grade III diastolic  dysfunction (restrictive). Right Ventricle: The right ventricular size is normal.     -Repeating 2D echocardiogram >>>>   Hypertensive crisis -On arrival blood pressure 219/147 >>> much improved -Off nitroglycerin drip>>> tapered off -Reducing BP slowly in ICU setting   -Resuming home medication of Coreg 25 mg daily, hydralazine 50 mg p.o. 3 times daily, losartan 50 mg p.o. daily,  Acute respiratory failure with hypoxia -Resolved -Likely was due to hypertensive crisis -Off nitro drip, on room air now satting 97%   HTN (hypertension) -Compliant with home medication -Reviewed and will resume home  medication  Coreg 25 mg daily, hydralazine 50 mg p.o. 3 times daily, losartan 50 mg p.o. daily,  Cocaine abuse - Advised cessation of substance abuse  CHF exacerbation -Please see CHF exacerbation -Review home medication -resuming accordingly -Workup as above -Cardiology consulted, medication reviewed, weaning off Lasix drip and nitroglycerin drip  Elevated troponin -Likely due to hypertensive crisis, tachyarrhythmia -Ischemic demand -EKG tachyarrhythmia negative any ST elevation depression per EDP -EP has discussed the case with cardiology on-call at [facility] Who is recommending current management of blood pressure and hypertensive crisis, diuretics  High grade B-cell lymphoma -Currently not on any medication -Follow-up with oncology as an outpatient  Tobacco abuse -Counseled against smoking use and abuse -NicoDerm patch provided  Substance abuse -Urine drug screen positive for cocaine and marijuana -Patient was counseled extensively regarding substance use and abuse Expressed understanding  HIV (human immunodeficiency virus infection) -Review home medication will resume  -Continue home medication of BIKTARVY -Compliance rate reinforced  Noncompliance with medication regimen -Enforced regarding compliance            ----------------------------------------------------------------------------------------------------------------------------------------------- Nutritional status:  The patient's BMI is: Body mass index is 19.95 kg/m. I agree with the assessment and plan as outlined below: Nutrition Status:        ----------------------------------------------------------------------------------------------------------------------  DVT prophylaxis:  heparin injection 5,000 Units Start: 02/07/23 2200 TED hose Start: 02/07/23 1237 SCDs Start: 02/07/23 1237   Code Status:   Code Status: Full Code  Family Communication: No family member present at  bedside- discussed with the patient  -Advance care planning has been discussed.   Admission status:   Status is: Inpatient Remains inpatient appropriate because:    Disposition: From  - home             Planning for discharge in 1-2 days: to   Procedures:   No admission procedures for hospital encounter.   Antimicrobials:  Anti-infectives (From admission, onward)    Start     Dose/Rate Route Frequency Ordered Stop   02/07/23 1400  bictegravir-emtricitabine-tenofovir AF (BIKTARVY) 50-200-25 MG per tablet 1 tablet        1 tablet Oral Daily 02/07/23 1312          Medication:   aspirin  81 mg Oral Daily   bictegravir-emtricitabine-tenofovir AF  1 tablet Oral Daily   carvedilol  25 mg Oral BID WC   Chlorhexidine Gluconate Cloth  6 each Topical Q0600   furosemide  40 mg Intravenous Q12H   heparin  5,000 Units Subcutaneous Q8H   hydrALAZINE  50 mg Oral TID   losartan  50 mg Oral Daily   mupirocin ointment  1 Application Nasal BID   sodium chloride flush  3 mL Intravenous Q12H    acetaminophen **OR** acetaminophen, bisacodyl, hydrALAZINE, HYDROmorphone (DILAUDID) injection, ipratropium, labetalol, levalbuterol, ondansetron **OR** ondansetron (ZOFRAN) IV, oxyCODONE, senna-docusate, sodium phosphate, zolpidem   Objective:   Vitals:   02/08/23 0830 02/08/23  0900 02/08/23 0930 02/08/23 1000  BP: (!) 152/92 (!) 152/97 120/69 (!) 98/59  Pulse: 77 77 74 74  Resp: 16  14   Temp:      TempSrc:      SpO2: 97% 94% 94% 97%  Weight:      Height:        Intake/Output Summary (Last 24 hours) at 02/08/2023 1020 Last data filed at 02/08/2023 [id number] Gross per 24 hour  Intake 1474.76 ml  Output 2900 ml  Net -1425.24 ml   Filed Weights   02/07/23 0823 02/07/23 1455 02/08/23 0722  Weight: 77.1 kg 70.5 kg 68.6 kg     Physical examination:   Constitution:  Alert, cooperative, no distress,  Appears calm and comfortable  Psychiatric:   Normal and stable mood and affect, cognition  intact,   HEENT:        Normocephalic, PERRL, otherwise with in Normal limits  Chest:         Chest symmetric Cardio vascular:  S1/S2, RRR, No murmure, No Rubs or Gallops  pulmonary: Clear to auscultation bilaterally, respirations unlabored, negative wheezes / crackles Abdomen: Soft, non-tender, non-distended, bowel sounds,no masses, no organomegaly Muscular skeletal: Limited exam - in bed, able to move all 4 extremities,   Neuro: CNII-XII intact. , normal motor and sensation, reflexes intact  Extremities: No pitting edema lower extremities, +2 pulses  Skin: Dry, warm to touch, negative for any Rashes, No open wounds Wounds: per nursing documentation   ------------------------------------------------------------------------------------------------------------------------------------------    LABs:     Latest Ref Rng & Units 02/08/2023    3:19 AM 02/07/2023    8:58 AM 09/05/2022    6:01 AM  CBC  WBC 4.0 - 10.5 K/uL 4.3  8.7  6.2   Hemoglobin 13.0 - 17.0 g/dL 12.2  13.1  10.6   Hematocrit 39.0 - 52.0 % 37.6  39.9  32.8   Platelets 150 - 400 K/uL 145  145  269       Latest Ref Rng & Units 02/08/2023    3:19 AM 02/07/2023   10:57 AM 02/07/2023    8:58 AM  CMP  Glucose 70 - 99 mg/dL 116   104   BUN 6 - 20 mg/dL 22   18   Creatinine 0.61 - 1.24 mg/dL 1.38  1.18  1.16   Sodium 135 - 145 mmol/L 137   137   Potassium 3.5 - 5.1 mmol/L 3.0   3.6   Chloride 98 - 111 mmol/L 101   105   CO2 22 - 32 mmol/L 25   21   Calcium 8.9 - 10.3 mg/dL 8.6   8.8        Micro Results Recent Results (from the past 240 hour(s))  SARS Coronavirus 2 by RT PCR (hospital order, performed in [place] hospital lab) *cepheid single result test* Anterior Nasal Swab     Status: None   Collection Time: 02/07/23  8:54 AM   Specimen: Anterior Nasal Swab  Result Value Ref Range Status   SARS Coronavirus 2 by RT PCR NEGATIVE NEGATIVE Final    Comment: (NOTE) SARS-CoV-2 target nucleic acids are NOT  DETECTED.  The SARS-CoV-2 RNA is generally detectable in upper and lower respiratory specimens during the acute phase of infection. The lowest concentration of SARS-CoV-2 viral copies this assay can detect is 250 copies / mL. A negative result does not preclude SARS-CoV-2 infection and should not be used as the sole basis for treatment or other patient management  decisions.  A negative result may occur with improper specimen collection / handling, submission of specimen other than nasopharyngeal swab, presence of viral mutation(s) within the areas targeted by this assay, and inadequate number of viral copies (<250 copies / mL). A negative result must be combined with clinical observations, patient history, and epidemiological information.  Fact Sheet for Patients:   https://www.patel.info/  Fact Sheet for Healthcare Providers: https://hall.com/  This test is not yet approved or  cleared by the [place] FDA and has been authorized for detection and/or diagnosis of SARS-CoV-2 by FDA under an Emergency Use Authorization (EUA).  This EUA will remain in effect (meaning this test can be used) for the duration of the COVID-19 declaration under Section 564(b)(1) of the Act, 21 U.S.C. section 360bbb-3(b)(1), unless the authorization is terminated or revoked sooner.  Performed at [facility], [address] [place], [place] [address]   Culture, blood (routine x 2)     Status: None (Preliminary result)   Collection Time: 02/07/23  9:06 AM   Specimen: BLOOD LEFT FOREARM  Result Value Ref Range Status   Specimen Description BLOOD LEFT FOREARM  Final   Special Requests   Final    BOTTLES DRAWN AEROBIC AND ANAEROBIC Blood Culture adequate volume   Culture   Final    NO GROWTH < 24 HOURS Performed at [facility], [address] [place], [place] [address]    Report Status PENDING  Incomplete  Culture, blood (routine x 2)     Status: None  (Preliminary result)   Collection Time: 02/07/23  9:32 AM   Specimen: BLOOD  Result Value Ref Range Status   Specimen Description BLOOD LEFT ANTECUBITAL  Final   Special Requests   Final    BOTTLES DRAWN AEROBIC AND ANAEROBIC Blood Culture adequate volume   Culture   Final    NO GROWTH < 24 HOURS Performed at [facility], [address] [place], [place] [address]    Report Status PENDING  Incomplete  MRSA Next Gen by PCR, Nasal     Status: Abnormal   Collection Time: 02/07/23  3:44 PM   Specimen: Nasal Mucosa; Nasal Swab  Result Value Ref Range Status   MRSA by PCR Next Gen DETECTED (A) NOT DETECTED Corrected    Comment: RESULT CALLED TO, READ BACK BY AND VERIFIED WITH: ISSACS, T AT 1937 ON 02/07/23 BY SMN.        The GeneXpert MRSA Assay (FDA approved for NASAL specimens only), is one component of a comprehensive MRSA colonization surveillance program. It is not intended to diagnose MRSA infection nor to guide or monitor treatment for MRSA infections. Performed at [facility], [address], [place], [place] [address] CORRECTED ON 03/31 AT 1940: PREVIOUSLY REPORTED AS RESULT CALLED TO, READ BACK BY AND VERIFIED WITH: ISSACS, T AT 1937 ON 02/07/23 BY SMN.        The GeneXpert MRSA Assay (FDA approved for NASAL specimens only), is one component of a comprehensive MRSA  colonization surveillance program. It is not intended to diagnose MRSA infection nor to guide or monitor treatment for MRSA infections.     Radiology Reports No results found.  SIGNED: Deatra James, MD, FHM. FAAFP. Zacarias Pontes - [facility] Time spent - 55 min.  Of critical care time was spent in seeing, evaluating and examining the patient. Reviewing medical records, labs, drawn plan of care. [facility],  Pager (please use [email or web address] to page/ text) Please use Epic Secure Chat for  non-urgent communication (7AM-7PM)  If 7PM-7AM, please contact night-coverage [email or web address], 02/08/2023, 10:20  AM

## 2023-02-08 NOTE — Progress Notes (Signed)
Nsg Discharge Note  Admit Date:  02/07/2023 Discharge date: 02/08/2023   Devoris Wiese to be D/C'd Home per MD order.  AVS completed.  Copy for chart, and copy for patient signed, and dated. Patient/caregiver able to verbalize understanding.  Discharge Medication: Allergies as of 02/08/2023       Reactions   Motrin [ibuprofen] Hypertension   Neosporin [neomycin-bacitracin Zn-polymyx] Rash        Medication List     TAKE these medications    acetaminophen 500 MG tablet Commonly known as: TYLENOL Take 1,000 mg by mouth every 6 (six) hours as needed for moderate pain or headache.   aspirin 81 MG chewable tablet Chew 1 tablet (81 mg total) by mouth daily.   Biktarvy 50-200-25 MG Tabs tablet Generic drug: bictegravir-emtricitabine-tenofovir AF Take 1 tablet by mouth daily.   carvedilol 25 MG tablet Commonly known as: COREG Take 1 tablet (25 mg total) by mouth 2 (two) times daily with a meal.   furosemide 80 MG tablet Commonly known as: LASIX Take 1 tablet (80 mg total) by mouth daily.   hydrALAZINE 100 MG tablet Commonly known as: APRESOLINE Take 0.5 tablets (50 mg total) by mouth 3 (three) times daily.   losartan 50 MG tablet Commonly known as: COZAAR Take 1 tablet (50 mg total) by mouth daily.        Discharge Assessment: Vitals:   02/08/23 1100 02/08/23 1200  BP: (!) 95/59 107/65  Pulse: 73 71  Resp: 19 16  Temp:    SpO2: 93% 95%   Skin clean, dry and intact without evidence of skin break down, no evidence of skin tears noted. IV catheter discontinued intact. Site without signs and symptoms of complications - no redness or edema noted at insertion site, patient denies c/o pain - only slight tenderness at site.  Dressing with slight pressure applied.  D/c Instructions-Education: Discharge instructions given to patient/family with verbalized understanding. D/c education completed with patient/family including follow up instructions, medication list, d/c  activities limitations if indicated, with other d/c instructions as indicated by MD - patient able to verbalize understanding, all questions fully answered. Patient instructed to return to ED, call 911, or call MD for any changes in condition.  Patient escorted via [place], and D/C home via private auto.  Carney Corners, RN 02/08/2023 12:49 PM

## 2023-02-12 LAB — CULTURE, BLOOD (ROUTINE X 2)
Culture: NO GROWTH
Culture: NO GROWTH
Special Requests: ADEQUATE
Special Requests: ADEQUATE

## 2023-02-22 ENCOUNTER — Encounter: Payer: Self-pay | Admitting: Physician Assistant

## 2023-02-22 NOTE — Progress Notes (Deleted)
Cardiology Office Note    Date:  02/22/2023   ID:  Richard Cox, DOB May 06, 1981, MRN [id number]  PCP:  Pcp, No  Cardiologist:  Nona Dell, MD  Electrophysiologist:  None   Chief Complaint: ***  History of Present Illness:   Richard Cox is a 42 y.o. male with history of chronic HFrEF, HTN, cocaine/THC use, medication noncompliance, tobacco abuse, CKD stage 2-3a, HIV, B-cell lymphoma seen for follow-up.  He has history of CHF going back to 2020 when he presented with marked HTN, had not been on any medicaitons in several years, found to have EF 40-45%. He's had several admissions and echocardiograms since that time for recurrent CHF symptoms/flash pulmonary edema in the context of ongoing cocaine use and medication nonadherence. He's only been seen in the office once in 2020. In 2022 he had a cardiac MRI that argued against amyloidosis but raised question of hypertrophyic cardiomyopathy. Findings of >30 mm wall thickness and elevated T2 signal suggested higher risk phenotype HCM. His last echo was during recent admission 02/08/23 showing EF 25-30%, global HK, severe asymmetric left ventricular hypertrophy of the septal segment, also involving RV, consistent with hypertrophic cardiomyopathy, although no obvious LVOT gradient to suggest obstruction at rest, G1DD, normal RV function, severely increased RV thickness, severely dilated LA. During that time, he had presented with severely elevated BP, out of his medications, required NTG drip and Lasix drip. His troponin was elevated to 160, flat trend, felt due to demand ischemia. No prior cath. Cardiomyopathy was previously felt due to HTN. Medical therapy was recommended.  Cmet, mg, tsh  Chronic HFrEF Hypertrophic cardiomyopathy Essential HTN Polysubstance use CKD stage 2-3a   Labwork independently reviewed: 3-02/2023 BNP 2k, Mg 2.6, K 3.0, Cr 1.38, troponin peak 160, UDS + cocaine, THC 08/2022 AST 56, ALT 49 2022 TSH wnl  Past History    Past Medical History:  Diagnosis Date   Acute respiratory failure with hypoxia (HCC) 08/24/2021   CHF (congestive heart failure) (HCC)    Endotracheally intubated 08/24/2021   Essential hypertension    HIV infection (HCC)    Lymphoma (HCC) 08/28/2020   Noncompliance with medication regimen    Secondary cardiomyopathy (HCC)     Past Surgical History:  Procedure Laterality Date   IR IMAGING GUIDED PORT INSERTION  08/23/2020   Right   NO PAST SURGERIES      Current Medications: No outpatient medications have been marked as taking for the 02/23/23 encounter (Appointment) with Laurann Montana, PA-C.   ***   Allergies:   Motrin [ibuprofen] and Neosporin [neomycin-bacitracin zn-polymyx]   Social History   Socioeconomic History   Marital status: Single    Spouse name: Not on file   Number of children: 0   Years of education: 13   Highest education level: Not on file  Occupational History   Not on file  Tobacco Use   Smoking status: Former    Packs/day: .25    Types: Cigarettes   Smokeless tobacco: Never  Vaping Use   Vaping Use: Never used  Substance and Sexual Activity   Alcohol use: Not Currently   Drug use: Yes    Types: Marijuana, Cocaine   Sexual activity: Not Currently    Comment: given condoms  Other Topics Concern   Not on file  Social History Narrative   Not on file   Social Determinants of Health   Financial Resource Strain: Low Risk  (09/10/2020)   Overall Financial Resource Strain (CARDIA)  Difficulty of Paying Living Expenses: Not very hard  Food Insecurity: No Food Insecurity (09/05/2022)   Hunger Vital Sign    Worried About Running Out of Food in the Last Year: Never true    Ran Out of Food in the Last Year: Never true  Transportation Needs: No Transportation Needs (09/05/2022)   PRAPARE - Administrator, Civil Service (Medical): No    Lack of Transportation (Non-Medical): No  Physical Activity: Insufficiently Active (09/10/2020)    Exercise Vital Sign    Days of Exercise per Week: 5 days    Minutes of Exercise per Session: 20 min  Stress: No Stress Concern Present (09/10/2020)   [employer] of Occupational Health - Occupational Stress Questionnaire    Feeling of Stress : Only a little  Social Connections: Socially Isolated (09/10/2020)   Social Connection and Isolation Panel [NHANES]    Frequency of Communication with Friends and Family: More than three times a week    Frequency of Social Gatherings with Friends and Family: Never    Attends Religious Services: Never    Database administrator or Organizations: No    Attends Engineer, structural: Not on file    Marital Status: Never married     Family History:  The patient's ***family history includes Cancer in his father and mother; Congestive Heart Failure in his brother; Diabetes Mellitus II in his mother; Heart Problems in his brother; Lupus in his brother.  ROS:   Please see the history of present illness. Otherwise, review of systems is positive for ***.  All other systems are reviewed and otherwise negative.    EKG(s)/Additional Testing   EKG:  EKG is ordered today, personally reviewed, demonstrating ***  CV Studies: Cardiac studies reviewed are outlined and summarized above. Otherwise please see EMR for full report.  Recent Labs: 09/04/2022: ALT 49 02/08/2023: B Natriuretic Peptide 2,032.0; BUN 22; Creatinine, Ser 1.38; Hemoglobin 12.2; Magnesium 2.6; Platelets 145; Potassium 3.0; Sodium 137  Recent Lipid Panel    Component Value Date/Time   CHOL 183 08/25/2021 0336   TRIG 120 08/25/2021 0336   TRIG 121 08/25/2021 0336   HDL 25 (L) 08/25/2021 0336   CHOLHDL 7.3 08/25/2021 0336   VLDL 24 08/25/2021 0336   LDLCALC 134 (H) 08/25/2021 0336   LDLCALC 132 (H) 01/27/2021 0951    PHYSICAL EXAM:    VS:  There were no vitals taken for this visit.  BMI: There is no height or weight on file to calculate BMI.  GEN: Well nourished, well  developed male in no acute distress HEENT: normocephalic, atraumatic Neck: no JVD, carotid bruits, or masses Cardiac: ***RRR; no murmurs, rubs, or gallops, no edema  Respiratory:  clear to auscultation bilaterally, normal work of breathing GI: soft, nontender, nondistended, + BS MS: no deformity or atrophy Skin: warm and dry, no rash Neuro:  Alert and Oriented x 3, Strength and sensation are intact, follows commands Psych: euthymic mood, full affect  Wt Readings from Last 3 Encounters:  02/08/23 151 lb 3.8 oz (68.6 kg)  09/08/22 162 lb 12.8 oz (73.8 kg)  09/05/22 163 lb 4.8 oz (74.1 kg)     ASSESSMENT & PLAN:   ***     Disposition: F/u with ***   Medication Adjustments/Labs and Tests Ordered: Current medicines are reviewed at length with the patient today.  Concerns regarding medicines are outlined above. Medication changes, Labs and Tests ordered today are summarized above and listed in the Patient Instructions  accessible in Encounters.    Signed, Laurann Montana, PA-C  02/22/2023 12:17 PM    [facility] [place] Location in [facility] [address] [place], [place] [address] Ph: [phone]; Fax [phone]

## 2023-02-23 ENCOUNTER — Ambulatory Visit: Payer: Medicare Other | Attending: Physician Assistant | Admitting: Physician Assistant

## 2023-02-23 DIAGNOSIS — I10 Essential (primary) hypertension: Secondary | ICD-10-CM

## 2023-02-23 DIAGNOSIS — I5022 Chronic systolic (congestive) heart failure: Secondary | ICD-10-CM

## 2023-02-23 DIAGNOSIS — F1910 Other psychoactive substance abuse, uncomplicated: Secondary | ICD-10-CM

## 2023-02-23 DIAGNOSIS — I422 Other hypertrophic cardiomyopathy: Secondary | ICD-10-CM

## 2023-02-24 ENCOUNTER — Telehealth (HOSPITAL_COMMUNITY): Payer: Self-pay | Admitting: Cardiology

## 2023-02-24 NOTE — Telephone Encounter (Signed)
Not able to  schedule np appt, mailbox is full

## 2023-03-04 ENCOUNTER — Ambulatory Visit: Payer: Medicaid Other | Admitting: Family

## 2023-03-19 ENCOUNTER — Other Ambulatory Visit: Payer: Self-pay

## 2023-03-19 ENCOUNTER — Other Ambulatory Visit (HOSPITAL_COMMUNITY): Payer: Self-pay

## 2023-03-19 DIAGNOSIS — B20 Human immunodeficiency virus [HIV] disease: Secondary | ICD-10-CM

## 2023-03-19 MED ORDER — BIKTARVY 50-200-25 MG PO TABS
1.0000 | ORAL_TABLET | Freq: Every day | ORAL | 0 refills | Status: DC
Start: 2023-03-19 — End: 2023-05-19

## 2023-03-29 ENCOUNTER — Ambulatory Visit: Payer: Medicaid Other | Admitting: Family

## 2023-04-22 ENCOUNTER — Other Ambulatory Visit (HOSPITAL_COMMUNITY): Payer: Self-pay

## 2023-04-26 ENCOUNTER — Other Ambulatory Visit: Payer: Self-pay | Admitting: Pharmacist

## 2023-04-26 DIAGNOSIS — B20 Human immunodeficiency virus [HIV] disease: Secondary | ICD-10-CM

## 2023-04-26 MED ORDER — BIKTARVY 50-200-25 MG PO TABS
1.0000 | ORAL_TABLET | Freq: Every day | ORAL | 0 refills | Status: AC
Start: 2023-04-23 — End: 2023-05-14

## 2023-04-26 NOTE — Progress Notes (Signed)
Medication Samples have been provided to the patient.  Drug name: Biktarvy        Strength: 50/200/25 mg       Qty: 3 bottles (21 tablets)   LOT: [id number]   Exp.Date: 06/2025  Dosing instructions: Take one tablet by mouth once daily  The patient has been instructed regarding the correct time, dose, and frequency of taking this medication, including desired effects and most common side effects.   Eulla Kochanowski L. Jannette Fogo, PharmD, BCIDP, AAHIVP, CPP Clinical Pharmacist Practitioner Infectious Diseases Clinical Pharmacist [facility] 10/21/2020, 10:07 AM

## 2023-05-18 ENCOUNTER — Ambulatory Visit: Payer: Medicare Other | Admitting: Family

## 2023-05-19 ENCOUNTER — Encounter: Payer: Self-pay | Admitting: Family

## 2023-05-19 ENCOUNTER — Ambulatory Visit: Payer: Medicare Other

## 2023-05-19 ENCOUNTER — Ambulatory Visit (INDEPENDENT_AMBULATORY_CARE_PROVIDER_SITE_OTHER): Payer: Medicare Other | Admitting: Family

## 2023-05-19 ENCOUNTER — Other Ambulatory Visit: Payer: Self-pay

## 2023-05-19 VITALS — BP 124/79 | HR 77 | Temp 98.2°F | Resp 16 | Wt 157.4 lb

## 2023-05-19 DIAGNOSIS — Z720 Tobacco use: Secondary | ICD-10-CM

## 2023-05-19 DIAGNOSIS — F1410 Cocaine abuse, uncomplicated: Secondary | ICD-10-CM

## 2023-05-19 DIAGNOSIS — B20 Human immunodeficiency virus [HIV] disease: Secondary | ICD-10-CM | POA: Diagnosis not present

## 2023-05-19 DIAGNOSIS — I509 Heart failure, unspecified: Secondary | ICD-10-CM

## 2023-05-19 DIAGNOSIS — Z113 Encounter for screening for infections with a predominantly sexual mode of transmission: Secondary | ICD-10-CM

## 2023-05-19 DIAGNOSIS — Z609 Problem related to social environment, unspecified: Secondary | ICD-10-CM

## 2023-05-19 DIAGNOSIS — C8510 Unspecified B-cell lymphoma, unspecified site: Secondary | ICD-10-CM

## 2023-05-19 DIAGNOSIS — F17210 Nicotine dependence, cigarettes, uncomplicated: Secondary | ICD-10-CM

## 2023-05-19 DIAGNOSIS — Z0000 Encounter for general adult medical examination without abnormal findings: Secondary | ICD-10-CM

## 2023-05-19 MED ORDER — FUROSEMIDE 80 MG PO TABS
80.0000 mg | ORAL_TABLET | Freq: Every day | ORAL | 2 refills | Status: DC
Start: 2023-05-19 — End: 2023-05-24

## 2023-05-19 MED ORDER — BIKTARVY 50-200-25 MG PO TABS
1.0000 | ORAL_TABLET | Freq: Every day | ORAL | 5 refills | Status: DC
Start: 2023-05-19 — End: 2023-05-24

## 2023-05-19 NOTE — Assessment & Plan Note (Signed)
Currently with supplemental food assistance secondary to limited funds. Groceries provided. Will continue to work to connect to available community resources.

## 2023-05-19 NOTE — Assessment & Plan Note (Signed)
Currently on furosemide and carvedilol. Requesting refill of furosemide. Encouraged to establish with Internal Medicine and Cardiology for follow up with information provided in AVS.

## 2023-05-19 NOTE — Assessment & Plan Note (Signed)
Richard Cox continues to smoke tobacco daily and have encouraged cessation to reduce risk of renal disease, cardiovascular disease, respiratory disease and malignancy in the future. In the pre-contemplation stage of change and not ready to quit.

## 2023-05-19 NOTE — Assessment & Plan Note (Signed)
Currently being monitored and not in treatment. Encouraged follow up with Oncology as recommended.

## 2023-05-19 NOTE — Assessment & Plan Note (Signed)
Richard Cox continues to use cocaine although is using significantly less. Discussed importance of cessation as it continues to put his heart at risk for further damage amongst other side effects.

## 2023-05-19 NOTE — Assessment & Plan Note (Signed)
Richard Cox continues to have well controlled virus with good adherence and tolerance to Biktarvy. Has been out of medication for 5 days and would suspect that he remains undetectable. Reviewed previous lab work and discussed plan of care. Check lab work. Sample of Biktarvy provided and recorded in pharmacy record. Continue current dose of Biktarvy and plan for follow up in 4 months or sooner if needed with lab work on the same day.

## 2023-05-19 NOTE — Assessment & Plan Note (Signed)
Discussed importance of safe sexual practice and condom use. Condoms and STD testing offered.  Declines vaccinations Dental contact information provided in AVS.  

## 2023-05-19 NOTE — Patient Instructions (Addendum)
Nice to see you.  We will check your lab work today.  Continue to take your medication daily as prescribed.  Refills have been sent to the pharmacy.  Please call [facility]) to schedule/follow up on your dental care at [phone]  Plan for follow up in 4 months or sooner if needed with lab work on the same day.  Have a great day and stay safe!  For PRIMARY CARE:  [facility] [address] [place],  [place]  [address] Main: [phone]  [facility] (24/7) Phone: [phone] Address: [address] [place], [place] [address]   [facility] [phone] Crisis Line [phone]

## 2023-05-19 NOTE — Progress Notes (Signed)
Brief Narrative   Patient ID: Richard Cox, male    DOB: 03/14/1981, 42 y.o.   MRN: [id number]  Richard Cox is a 42 y/o  AA male diagnosed with HIV in May of 2020 with risk factor for acquiring HIV including MSM. No history of opportunistic infection or acute retroviral syndrome. Initial blood work for entry to care with CD4 of 188 and viral load of 65,200 (CDC Stage 3). Genotype was wild with no significant resistance. Sole medication regimen of Biktarvy   Subjective:    Chief Complaint  Patient presents with   Follow-up    B20     HPI:  Richard Cox is a 42 y.o. 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 Viral load was undetectable and CD4 count 303. Renal function with creatinine 1.45 (eGFR >60) and normal electrolytes. Subsequently hospitalized in March 2024 for hypertensive emergency. Here today for follow up.  Richard Cox has been doing okay since last office visit. Increased levels of stress over the last couple of weeks. Ran out of medication about 5 days ago secondary to an insurance coverage issue that has since been resolved. Taking Biktarvy as prescribed with no adverse side effects. Requesting refill of his furosemide as he is working to establish with Internal Medicine and Cardiology. Continues to smoke marijuana and use cocaine. Due for follow up with Oncology. Condoms and STD testing offered.   Denies fevers, chills, night sweats, headaches, changes in vision, neck pain/stiffness, nausea, diarrhea, vomiting, lesions or rashes.    Allergies  Allergen Reactions   Motrin [Ibuprofen] Hypertension   Neosporin [Neomycin-Bacitracin Zn-Polymyx] Rash      Outpatient Medications Prior to Visit  Medication Sig Dispense Refill   carvedilol (COREG) 25 MG tablet Take 1 tablet (25 mg total) by mouth 2 (two) times daily with a meal. 60 tablet 2   hydrALAZINE (APRESOLINE) 100 MG tablet Take 0.5 tablets (50 mg total) by  mouth 3 (three) times daily. 90 tablet 1   losartan (COZAAR) 50 MG tablet Take 1 tablet (50 mg total) by mouth daily. 30 tablet 2   furosemide (LASIX) 80 MG tablet Take 1 tablet (80 mg total) by mouth daily. 30 tablet 2   acetaminophen (TYLENOL) 500 MG tablet Take 1,000 mg by mouth every 6 (six) hours as needed for moderate pain or headache. (Patient not taking: Reported on 05/19/2023)     aspirin 81 MG chewable tablet Chew 1 tablet (81 mg total) by mouth daily. (Patient not taking: Reported on 05/19/2023) 30 tablet 0   bictegravir-emtricitabine-tenofovir AF (BIKTARVY) 50-200-25 MG TABS tablet Take 1 tablet by mouth daily. (Patient not taking: Reported on 05/19/2023) 30 tablet 0   Facility-Administered Medications Prior to Visit  Medication Dose Route Frequency Provider Last Rate Last Admin   0.9 %  sodium chloride infusion   Intravenous Continuous Doreatha Massed, MD       0.9 %  sodium chloride infusion   Intravenous Continuous Doreatha Massed, MD   Stopped at 09/26/20 1124   0.9 %  sodium chloride infusion   Intravenous Continuous Doreatha Massed, MD 500 mL/hr at 11/04/20 0935 Bolus from Bag at 11/04/20 0935   sodium chloride flush (NS) 0.9 % injection 10 mL  10 mL Intravenous PRN Doreatha Massed, MD   10 mL at 10/15/20 1035   sodium chloride flush (NS) 0.9 % injection 10 mL  10 mL Intravenous PRN Doreatha Massed, MD   10 mL  at 11/07/20 1046     Past Medical History:  Diagnosis Date   Acute respiratory failure with hypoxia (HCC) 08/24/2021   Chronic HFrEF (heart failure with reduced ejection fraction) (HCC)    CKD (chronic kidney disease) stage 2, GFR 60-89 ml/min    Endotracheally intubated 08/24/2021   Essential hypertension    HIV infection (HCC)    Lymphoma (HCC) 08/28/2020   Noncompliance with medication regimen    Polysubstance abuse (HCC)    Secondary cardiomyopathy (HCC)      Past Surgical History:  Procedure Laterality Date   IR IMAGING GUIDED PORT  INSERTION  08/23/2020   Right   NO PAST SURGERIES        Review of Systems  Constitutional:  Negative for appetite change, chills, fatigue, fever and unexpected weight change.  Eyes:  Negative for visual disturbance.  Respiratory:  Negative for cough, chest tightness, shortness of breath and wheezing.   Cardiovascular:  Negative for chest pain and leg swelling.  Gastrointestinal:  Negative for abdominal pain, constipation, diarrhea, nausea and vomiting.  Genitourinary:  Negative for dysuria, flank pain, frequency, genital sores, hematuria and urgency.  Skin:  Negative for rash.  Allergic/Immunologic: Negative for immunocompromised state.  Neurological:  Negative for dizziness and headaches.      Objective:    BP 124/79   Pulse 77   Temp 98.2 F (36.8 C) (Oral)   Resp 16   Wt 157 lb 6.4 oz (71.4 kg)   SpO2 98%   BMI 20.77 kg/m  Nursing note and vital signs reviewed.  Physical Exam Constitutional:      General: He is not in acute distress.    Appearance: He is well-developed.     Comments: Seated in the chair; unkempt   Eyes:     Conjunctiva/sclera: Conjunctivae normal.  Cardiovascular:     Rate and Rhythm: Normal rate and regular rhythm.     Heart sounds: Normal heart sounds. No murmur heard.    No friction rub. No gallop.  Pulmonary:     Effort: Pulmonary effort is normal. No respiratory distress.     Breath sounds: Normal breath sounds. No wheezing or rales.  Chest:     Chest wall: No tenderness.  Abdominal:     General: Bowel sounds are normal.     Palpations: Abdomen is soft.     Tenderness: There is no abdominal tenderness.  Musculoskeletal:     Cervical back: Neck supple.  Lymphadenopathy:     Cervical: No cervical adenopathy.  Skin:    General: Skin is warm and dry.     Findings: No rash.  Neurological:     Mental Status: He is alert and oriented to person, place, and time.  Psychiatric:        Mood and Affect: Mood normal.         05/19/2023     1:43 PM 09/08/2022    9:32 AM 10/17/2021    8:47 AM 10/06/2021    8:32 AM 09/10/2021    1:21 PM  Depression screen PHQ 2/9  Decreased Interest 0 0 0 0 0  Down, Depressed, Hopeless 0 0 0 2 0  PHQ - 2 Score 0 0 0 2 0  Altered sleeping   0 3   Tired, decreased energy   0 3   Change in appetite   0 3   Feeling bad or failure about yourself    0 0   Trouble concentrating   0 0  Moving slowly or fidgety/restless   0 0   Suicidal thoughts   0 0   PHQ-9 Score   0 11   Difficult doing work/chores    Somewhat difficult        Assessment & Plan:    Patient Active Problem List   Diagnosis Date Noted   Poor social situation 05/19/2023   CHF (congestive heart failure) (HCC) 09/04/2022   Cocaine abuse (HCC) 09/04/2022   Hypertensive crisis 08/13/2022   Healthcare maintenance 10/17/2021   Acute respiratory failure with hypoxia (HCC) 08/24/2021   Elevated troponin 08/24/2021   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   Intra-abdominal lymphadenopathy 07/30/2020   Generalized lymphadenopathy 07/25/2020   Varicella zoster 01/04/2020   AIDS (acquired immune deficiency syndrome) (HCC) 10/10/2019   Acute on chronic combined systolic and diastolic CHF (congestive heart failure) (HCC) 09/18/2019   HIV (human immunodeficiency virus infection) (HCC) 09/18/2019   Substance abuse (HCC) 09/18/2019   Alcohol abuse 09/18/2019   Tobacco abuse 09/18/2019   HTN (hypertension) 03/10/2019   Noncompliance with medication regimen 03/10/2019     Problem List Items Addressed This Visit       Cardiovascular and Mediastinum   CHF (congestive heart failure) (HCC)    Currently on furosemide and carvedilol. Requesting refill of furosemide. Encouraged to establish with Internal Medicine and Cardiology for follow up with information provided in AVS.       Relevant Medications   furosemide (LASIX) 80 MG tablet     Other   Tobacco abuse    Mr. Shillingburg continues to smoke tobacco daily and have  encouraged cessation to reduce risk of renal disease, cardiovascular disease, respiratory disease and malignancy in the future. In the pre-contemplation stage of change and not ready to quit.       AIDS (acquired immune deficiency syndrome) (HCC) - Primary    Mr. Coston continues to have well controlled virus with good adherence and tolerance to [employer]. Has been out of medication for 5 days and would suspect that he remains undetectable. Reviewed previous lab work and discussed plan of care. Check lab work. Sample of Biktarvy provided and recorded in pharmacy record. Continue current dose of Biktarvy and plan for follow up in 4 months or sooner if needed with lab work on the same day.      Relevant Medications   bictegravir-emtricitabine-tenofovir AF (BIKTARVY) 50-200-25 MG TABS tablet   Other Relevant Orders   COMPLETE METABOLIC PANEL WITH GFR   HIV-1 RNA quant-no reflex-bld   T-helper cell (CD4)- ([facility] clinic only)   High grade B-cell lymphoma (HCC)    Currently being monitored and not in treatment. Encouraged follow up with Oncology as recommended.       Relevant Medications   bictegravir-emtricitabine-tenofovir AF (BIKTARVY) 50-200-25 MG TABS tablet   Healthcare maintenance    Discussed importance of safe sexual practice and condom use. Condoms and STD testing offered.  Declines vaccinations Dental contact information provided in AVS       Cocaine abuse [facility])    Mr. Mccorkel continues to use cocaine although is using significantly less. Discussed importance of cessation as it continues to put his heart at risk for further damage amongst other side effects.       Poor social situation    Currently with supplemental food assistance secondary to limited funds. Groceries provided. Will continue to work to connect to available community resources.       Other Visit Diagnoses     Screening for STDs (  sexually transmitted diseases)       Relevant Orders   RPR        I am having  Jorey Dollard maintain his acetaminophen, carvedilol, hydrALAZINE, losartan, aspirin, Biktarvy, and furosemide.   Meds ordered this encounter  Medications   bictegravir-emtricitabine-tenofovir AF (BIKTARVY) 50-200-25 MG TABS tablet    Sig: Take 1 tablet by mouth daily.    Dispense:  30 tablet    Refill:  5    Order Specific Question:   Supervising Provider    Answer:   Drue Second, CYNTHIA [[id number]]   furosemide (LASIX) 80 MG tablet    Sig: Take 1 tablet (80 mg total) by mouth daily.    Dispense:  30 tablet    Refill:  2    Order Specific Question:   Supervising Provider    Answer:   Judyann Munson [[id number]]     Follow-up: Return in about 4 months (around 09/19/2023), or if symptoms worsen or fail to improve.   Marcos Eke, MSN, FNP-C Nurse Practitioner [facility] for Infectious Disease [facility] Main number: [phone]

## 2023-05-20 ENCOUNTER — Other Ambulatory Visit: Payer: Self-pay | Admitting: Pharmacist

## 2023-05-20 DIAGNOSIS — B20 Human immunodeficiency virus [HIV] disease: Secondary | ICD-10-CM

## 2023-05-20 LAB — T-HELPER CELL (CD4) - (RCID CLINIC ONLY)
CD4 % Helper T Cell: 32 % — ABNORMAL LOW (ref 33–65)
CD4 T Cell Abs: 580 /uL (ref 400–1790)

## 2023-05-20 MED ORDER — BICTEGRAVIR-EMTRICITAB-TENOFOV 50-200-25 MG PO TABS
1.0000 | ORAL_TABLET | Freq: Every day | ORAL | 0 refills | Status: DC
Start: 2023-05-20 — End: 2023-05-22

## 2023-05-20 NOTE — Progress Notes (Signed)
Medication Samples have been provided to the patient.  Drug name: Biktarvy        Strength: 50/200/25 mg       Qty: 7 tablets (1 bottles) LOT: CPP2HA   Exp.Date: 07/08/25  Dosing instructions: Take one tablet by mouth once daily  The patient has been instructed regarding the correct time, dose, and frequency of taking this medication, including desired effects and most common side effects.   Margarite Gouge, PharmD, CPP, BCIDP, AAHIVP Clinical Pharmacist Practitioner Infectious Diseases Clinical Pharmacist [facility] for Infectious Disease

## 2023-05-21 ENCOUNTER — Inpatient Hospital Stay (HOSPITAL_COMMUNITY): Payer: Medicare Other

## 2023-05-21 ENCOUNTER — Emergency Department (HOSPITAL_COMMUNITY): Payer: Medicare Other

## 2023-05-21 ENCOUNTER — Encounter (HOSPITAL_COMMUNITY): Payer: Self-pay | Admitting: *Deleted

## 2023-05-21 ENCOUNTER — Inpatient Hospital Stay (HOSPITAL_COMMUNITY)
Admission: EM | Admit: 2023-05-21 | Discharge: 2023-05-24 | DRG: 917 | Discharge status: Against Medical Advice | Payer: Medicare Other | Attending: Internal Medicine | Admitting: Internal Medicine

## 2023-05-21 DIAGNOSIS — Z21 Asymptomatic human immunodeficiency virus [HIV] infection status: Secondary | ICD-10-CM | POA: Diagnosis present

## 2023-05-21 DIAGNOSIS — N179 Acute kidney failure, unspecified: Secondary | ICD-10-CM | POA: Diagnosis present

## 2023-05-21 DIAGNOSIS — T405X1A Poisoning by cocaine, accidental (unintentional), initial encounter: Secondary | ICD-10-CM | POA: Diagnosis present

## 2023-05-21 DIAGNOSIS — Z1152 Encounter for screening for COVID-19: Secondary | ICD-10-CM | POA: Diagnosis not present

## 2023-05-21 DIAGNOSIS — Z833 Family history of diabetes mellitus: Secondary | ICD-10-CM | POA: Diagnosis not present

## 2023-05-21 DIAGNOSIS — I130 Hypertensive heart and chronic kidney disease with heart failure and stage 1 through stage 4 chronic kidney disease, or unspecified chronic kidney disease: Secondary | ICD-10-CM | POA: Diagnosis present

## 2023-05-21 DIAGNOSIS — I5023 Acute on chronic systolic (congestive) heart failure: Secondary | ICD-10-CM | POA: Diagnosis not present

## 2023-05-21 DIAGNOSIS — Z91148 Patient's other noncompliance with medication regimen for other reason: Secondary | ICD-10-CM

## 2023-05-21 DIAGNOSIS — Z87891 Personal history of nicotine dependence: Secondary | ICD-10-CM | POA: Diagnosis not present

## 2023-05-21 DIAGNOSIS — N1831 Chronic kidney disease, stage 3a: Secondary | ICD-10-CM | POA: Diagnosis present

## 2023-05-21 DIAGNOSIS — Z881 Allergy status to other antibiotic agents status: Secondary | ICD-10-CM

## 2023-05-21 DIAGNOSIS — I5043 Acute on chronic combined systolic (congestive) and diastolic (congestive) heart failure: Secondary | ICD-10-CM | POA: Diagnosis present

## 2023-05-21 DIAGNOSIS — Z91199 Patient's noncompliance with other medical treatment and regimen due to unspecified reason: Secondary | ICD-10-CM

## 2023-05-21 DIAGNOSIS — J159 Unspecified bacterial pneumonia: Secondary | ICD-10-CM | POA: Diagnosis present

## 2023-05-21 DIAGNOSIS — Z886 Allergy status to analgesic agent status: Secondary | ICD-10-CM | POA: Diagnosis not present

## 2023-05-21 DIAGNOSIS — R0902 Hypoxemia: Principal | ICD-10-CM | POA: Diagnosis present

## 2023-05-21 DIAGNOSIS — R0602 Shortness of breath: Secondary | ICD-10-CM | POA: Diagnosis present

## 2023-05-21 DIAGNOSIS — F17210 Nicotine dependence, cigarettes, uncomplicated: Secondary | ICD-10-CM | POA: Diagnosis present

## 2023-05-21 DIAGNOSIS — I161 Hypertensive emergency: Secondary | ICD-10-CM | POA: Diagnosis present

## 2023-05-21 DIAGNOSIS — J189 Pneumonia, unspecified organism: Secondary | ICD-10-CM

## 2023-05-21 DIAGNOSIS — I509 Heart failure, unspecified: Secondary | ICD-10-CM | POA: Diagnosis not present

## 2023-05-21 DIAGNOSIS — Z7982 Long term (current) use of aspirin: Secondary | ICD-10-CM

## 2023-05-21 DIAGNOSIS — I3139 Other pericardial effusion (noninflammatory): Secondary | ICD-10-CM | POA: Diagnosis present

## 2023-05-21 DIAGNOSIS — I5020 Unspecified systolic (congestive) heart failure: Secondary | ICD-10-CM

## 2023-05-21 DIAGNOSIS — Z8572 Personal history of non-Hodgkin lymphomas: Secondary | ICD-10-CM | POA: Diagnosis not present

## 2023-05-21 DIAGNOSIS — I2489 Other forms of acute ischemic heart disease: Secondary | ICD-10-CM | POA: Diagnosis present

## 2023-05-21 DIAGNOSIS — B20 Human immunodeficiency virus [HIV] disease: Secondary | ICD-10-CM

## 2023-05-21 DIAGNOSIS — F1910 Other psychoactive substance abuse, uncomplicated: Secondary | ICD-10-CM | POA: Diagnosis not present

## 2023-05-21 DIAGNOSIS — J9601 Acute respiratory failure with hypoxia: Secondary | ICD-10-CM | POA: Diagnosis present

## 2023-05-21 DIAGNOSIS — R0603 Acute respiratory distress: Secondary | ICD-10-CM

## 2023-05-21 DIAGNOSIS — Z5329 Procedure and treatment not carried out because of patient's decision for other reasons: Secondary | ICD-10-CM | POA: Diagnosis present

## 2023-05-21 DIAGNOSIS — Z8269 Family history of other diseases of the musculoskeletal system and connective tissue: Secondary | ICD-10-CM

## 2023-05-21 DIAGNOSIS — Z8249 Family history of ischemic heart disease and other diseases of the circulatory system: Secondary | ICD-10-CM | POA: Diagnosis not present

## 2023-05-21 DIAGNOSIS — J9600 Acute respiratory failure, unspecified whether with hypoxia or hypercapnia: Secondary | ICD-10-CM | POA: Diagnosis present

## 2023-05-21 DIAGNOSIS — I422 Other hypertrophic cardiomyopathy: Secondary | ICD-10-CM | POA: Diagnosis present

## 2023-05-21 DIAGNOSIS — Z79899 Other long term (current) drug therapy: Secondary | ICD-10-CM | POA: Diagnosis not present

## 2023-05-21 DIAGNOSIS — Z9221 Personal history of antineoplastic chemotherapy: Secondary | ICD-10-CM

## 2023-05-21 DIAGNOSIS — I2510 Atherosclerotic heart disease of native coronary artery without angina pectoris: Secondary | ICD-10-CM | POA: Diagnosis present

## 2023-05-21 DIAGNOSIS — Z807 Family history of other malignant neoplasms of lymphoid, hematopoietic and related tissues: Secondary | ICD-10-CM

## 2023-05-21 LAB — I-STAT CHEM 8, ED
BUN: 17 mg/dL (ref 6–20)
Calcium, Ion: 1.21 mmol/L (ref 1.15–1.40)
Chloride: 109 mmol/L (ref 98–111)
Creatinine, Ser: 1.3 mg/dL — ABNORMAL HIGH (ref 0.61–1.24)
Glucose, Bld: 173 mg/dL — ABNORMAL HIGH (ref 70–99)
HCT: 46 % (ref 39.0–52.0)
Hemoglobin: 15.6 g/dL (ref 13.0–17.0)
Potassium: 4.1 mmol/L (ref 3.5–5.1)
Sodium: 144 mmol/L (ref 135–145)
TCO2: 24 mmol/L (ref 22–32)

## 2023-05-21 LAB — RAPID URINE DRUG SCREEN, HOSP PERFORMED
Amphetamines: NOT DETECTED
Barbiturates: NOT DETECTED
Benzodiazepines: POSITIVE — AB
Cocaine: POSITIVE — AB
Opiates: POSITIVE — AB
Tetrahydrocannabinol: POSITIVE — AB

## 2023-05-21 LAB — BASIC METABOLIC PANEL
Anion gap: 14 (ref 5–15)
BUN: 16 mg/dL (ref 6–20)
CO2: 19 mmol/L — ABNORMAL LOW (ref 22–32)
Calcium: 8.8 mg/dL — ABNORMAL LOW (ref 8.9–10.3)
Chloride: 105 mmol/L (ref 98–111)
Creatinine, Ser: 1.33 mg/dL — ABNORMAL HIGH (ref 0.61–1.24)
GFR, Estimated: >60 mL/min (ref >60)
Glucose, Bld: 178 mg/dL — ABNORMAL HIGH (ref 70–99)
Potassium: 4 mmol/L (ref 3.5–5.1)
Sodium: 138 mmol/L (ref 135–145)

## 2023-05-21 LAB — BLOOD GAS, ARTERIAL
Acid-base deficit: 3.9 mmol/L — ABNORMAL HIGH (ref 0.0–2.0)
Bicarbonate: 25.2 mmol/L (ref 20.0–28.0)
Drawn by: 22179
FIO2: 100 %
O2 Saturation: 100 %
Patient temperature: 36.8
pCO2 arterial: 62 mmHg — ABNORMAL HIGH (ref 32–48)
pH, Arterial: 7.21 — ABNORMAL LOW (ref 7.35–7.45)
pO2, Arterial: 310 mmHg — ABNORMAL HIGH (ref 83–108)

## 2023-05-21 LAB — LACTIC ACID, PLASMA
Lactic Acid, Venous: 6.4 mmol/L (ref 0.5–1.9)
Lactic Acid, Venous: 1.8 mmol/L (ref 0.5–1.9)
Lactic Acid, Venous: 1 mmol/L (ref 0.5–1.9)

## 2023-05-21 LAB — COMPLETE METABOLIC PANEL WITH GFR
AG Ratio: 1.4 (calc) (ref 1.0–2.5)
ALT: 8 U/L — ABNORMAL LOW (ref 9–46)
AST: 17 U/L (ref 10–40)
Albumin: 4.2 g/dL (ref 3.6–5.1)
Alkaline phosphatase (APISO): 74 U/L (ref 36–130)
BUN/Creatinine Ratio: 14 (calc) (ref 6–22)
BUN: 19 mg/dL (ref 7–25)
CO2: 24 mmol/L (ref 20–32)
Calcium: 9 mg/dL (ref 8.6–10.3)
Chloride: 106 mmol/L (ref 98–110)
Creat: 1.34 mg/dL — ABNORMAL HIGH (ref 0.60–1.29)
Globulin: 2.9 g/dL (calc) (ref 1.9–3.7)
Glucose, Bld: 97 mg/dL (ref 65–99)
Potassium: 3.6 mmol/L (ref 3.5–5.3)
Sodium: 142 mmol/L (ref 135–146)
Total Bilirubin: 0.3 mg/dL (ref 0.2–1.2)
Total Protein: 7.1 g/dL (ref 6.1–8.1)
eGFR: 69 mL/min/{1.73_m2} (ref >=60)

## 2023-05-21 LAB — RPR: RPR Ser Ql: NONREACTIVE

## 2023-05-21 LAB — TROPONIN I (HIGH SENSITIVITY)
Troponin I (High Sensitivity): 164 ng/L (ref <18)
Troponin I (High Sensitivity): 154 ng/L (ref <18)
Troponin I (High Sensitivity): 179 ng/L (ref <18)
Troponin I (High Sensitivity): 188 ng/L (ref <18)

## 2023-05-21 LAB — LACTATE DEHYDROGENASE: LDH: 225 U/L — ABNORMAL HIGH (ref 98–192)

## 2023-05-21 LAB — RESP PANEL BY RT-PCR (RSV, FLU A&B, COVID)  RVPGX2
Influenza A by PCR: NEGATIVE
Influenza B by PCR: NEGATIVE
Resp Syncytial Virus by PCR: NEGATIVE
SARS Coronavirus 2 by RT PCR: NEGATIVE

## 2023-05-21 LAB — BRAIN NATRIURETIC PEPTIDE: B Natriuretic Peptide: >4500 pg/mL — ABNORMAL HIGH (ref 0.0–100.0)

## 2023-05-21 LAB — HIV-1 RNA QUANT-NO REFLEX-BLD
HIV 1 RNA Quant: 52 Copies/mL — ABNORMAL HIGH
HIV-1 RNA Quant, Log: 1.71 Log cps/mL — ABNORMAL HIGH

## 2023-05-21 LAB — GLUCOSE, CAPILLARY
Glucose-Capillary: 61 mg/dL — ABNORMAL LOW (ref 70–99)
Glucose-Capillary: 88 mg/dL (ref 70–99)

## 2023-05-21 LAB — CBG MONITORING, ED: Glucose-Capillary: 208 mg/dL — ABNORMAL HIGH (ref 70–99)

## 2023-05-21 MED ORDER — FENTANYL CITRATE (PF) 100 MCG/2ML IJ SOLN
100.0000 ug | Freq: Once | INTRAMUSCULAR | Status: AC
  Administered 2023-05-21: 100 ug via INTRAVENOUS
  Filled 2023-05-21: qty 2

## 2023-05-21 MED ORDER — ASPIRIN 81 MG PO CHEW
81.0000 mg | CHEWABLE_TABLET | Freq: Every day | ORAL | Status: DC
  Administered 2023-05-21 – 2023-05-22 (×2): 81 mg
  Filled 2023-05-21 (×2): qty 1

## 2023-05-21 MED ORDER — DIAZEPAM 5 MG/ML IJ SOLN
5.0000 mg | Freq: Once | INTRAMUSCULAR | Status: DC
  Filled 2023-05-21: qty 2

## 2023-05-21 MED ORDER — ORAL CARE MOUTH RINSE
15.0000 mL | OROMUCOSAL | Status: DC
  Administered 2023-05-21 – 2023-05-22 (×14): 15 mL via OROMUCOSAL

## 2023-05-21 MED ORDER — PROPOFOL 1000 MG/100ML IV EMUL
5.0000 ug/kg/min | INTRAVENOUS | Status: DC
  Administered 2023-05-21: 40 ug/kg/min via INTRAVENOUS
  Administered 2023-05-21: 80 ug/kg/min via INTRAVENOUS
  Administered 2023-05-22 (×2): 40 ug/kg/min via INTRAVENOUS
  Filled 2023-05-21: qty 200
  Filled 2023-05-21 (×2): qty 100

## 2023-05-21 MED ORDER — SUCCINYLCHOLINE CHLORIDE 200 MG/10ML IV SOSY: 100.0000 mg | PREFILLED_SYRINGE | Freq: Once | INTRAVENOUS | Status: AC

## 2023-05-21 MED ORDER — FENTANYL 2500MCG IN NS 250ML (10MCG/ML) PREMIX INFUSION
0.0000 ug/h | INTRAVENOUS | Status: DC
  Administered 2023-05-21: 100 ug/h via INTRAVENOUS
  Administered 2023-05-22: 200 ug/h via INTRAVENOUS
  Filled 2023-05-21 (×2): qty 250

## 2023-05-21 MED ORDER — IOHEXOL 350 MG/ML SOLN
75.0000 mL | Freq: Once | INTRAVENOUS | Status: AC | PRN
  Administered 2023-05-21: 75 mL via INTRAVENOUS

## 2023-05-21 MED ORDER — HYDRALAZINE HCL 50 MG PO TABS
50.0000 mg | ORAL_TABLET | Freq: Three times a day (TID) | ORAL | Status: DC
  Administered 2023-05-21 – 2023-05-22 (×3): 50 mg
  Filled 2023-05-21 (×4): qty 1

## 2023-05-21 MED ORDER — FAMOTIDINE 20 MG PO TABS
20.0000 mg | ORAL_TABLET | Freq: Two times a day (BID) | ORAL | Status: DC
  Administered 2023-05-21 (×2): 20 mg
  Filled 2023-05-21 (×2): qty 1

## 2023-05-21 MED ORDER — POLYETHYLENE GLYCOL 3350 17 G PO PACK
17.0000 g | PACK | Freq: Every day | ORAL | Status: DC
  Administered 2023-05-21 – 2023-05-22 (×2): 17 g
  Filled 2023-05-21 (×3): qty 1

## 2023-05-21 MED ORDER — SODIUM CHLORIDE 0.9 % IV SOLN: 2.0000 g | Freq: Once | INTRAVENOUS | Status: DC

## 2023-05-21 MED ORDER — KETAMINE HCL 50 MG/5ML IJ SOSY
PREFILLED_SYRINGE | INTRAMUSCULAR | Status: AC
  Administered 2023-05-21: 100 mg via INTRAVENOUS
  Filled 2023-05-21: qty 10

## 2023-05-21 MED ORDER — LOSARTAN POTASSIUM 50 MG PO TABS
50.0000 mg | ORAL_TABLET | Freq: Every day | ORAL | Status: DC
  Administered 2023-05-22: 50 mg
  Filled 2023-05-21 (×2): qty 1

## 2023-05-21 MED ORDER — FUROSEMIDE 10 MG/ML IJ SOLN
40.0000 mg | Freq: Two times a day (BID) | INTRAMUSCULAR | Status: DC
  Administered 2023-05-21 – 2023-05-24 (×6): 40 mg via INTRAVENOUS
  Filled 2023-05-21 (×6): qty 4

## 2023-05-21 MED ORDER — SUCCINYLCHOLINE CHLORIDE 200 MG/10ML IV SOSY
PREFILLED_SYRINGE | INTRAVENOUS | Status: AC
  Administered 2023-05-21: 100 mg via INTRAVENOUS
  Filled 2023-05-21: qty 10

## 2023-05-21 MED ORDER — ONDANSETRON HCL 4 MG/2ML IJ SOLN
4.0000 mg | Freq: Four times a day (QID) | INTRAMUSCULAR | Status: DC | PRN
  Administered 2023-05-22 (×2): 4 mg via INTRAVENOUS
  Filled 2023-05-21 (×2): qty 2

## 2023-05-21 MED ORDER — DEXTROSE 50 % IV SOLN: 12.5000 g | INTRAVENOUS | Status: AC

## 2023-05-21 MED ORDER — SULFAMETHOXAZOLE-TRIMETHOPRIM 400-80 MG/5ML IV SOLN
5.0000 mg/kg | Freq: Three times a day (TID) | INTRAVENOUS | Status: DC
  Filled 2023-05-21 (×4): qty 22.31

## 2023-05-21 MED ORDER — CHLORHEXIDINE GLUCONATE CLOTH 2 % EX PADS
6.0000 | MEDICATED_PAD | Freq: Every day | CUTANEOUS | Status: DC
  Administered 2023-05-22 – 2023-05-24 (×3): 6 via TOPICAL

## 2023-05-21 MED ORDER — ENOXAPARIN SODIUM 40 MG/0.4ML IJ SOSY
40.0000 mg | PREFILLED_SYRINGE | Freq: Every day | INTRAMUSCULAR | Status: DC
  Administered 2023-05-21 – 2023-05-24 (×4): 40 mg via SUBCUTANEOUS
  Filled 2023-05-21 (×4): qty 0.4

## 2023-05-21 MED ORDER — PROPOFOL 1000 MG/100ML IV EMUL
INTRAVENOUS | Status: AC
  Administered 2023-05-21: 20 ug/kg/min via INTRAVENOUS
  Filled 2023-05-21: qty 100

## 2023-05-21 MED ORDER — ORAL CARE MOUTH RINSE: 15.0000 mL | OROMUCOSAL | Status: DC | PRN

## 2023-05-21 MED ORDER — KETAMINE HCL 50 MG/5ML IJ SOSY: 50.0000 mg | PREFILLED_SYRINGE | Freq: Once | INTRAMUSCULAR | Status: AC

## 2023-05-21 MED ORDER — DIAZEPAM 5 MG/ML IJ SOLN
5.0000 mg | Freq: Once | INTRAMUSCULAR | Status: AC
  Administered 2023-05-21: 5 mg via INTRAMUSCULAR

## 2023-05-21 MED ORDER — HYDRALAZINE HCL 20 MG/ML IJ SOLN
10.0000 mg | Freq: Four times a day (QID) | INTRAMUSCULAR | Status: DC | PRN
  Administered 2023-05-21 – 2023-05-24 (×6): 10 mg via INTRAVENOUS
  Filled 2023-05-21 (×6): qty 1

## 2023-05-21 MED ORDER — SODIUM CHLORIDE 0.9 % IV SOLN
3.0000 g | Freq: Four times a day (QID) | INTRAVENOUS | Status: DC
  Administered 2023-05-21 – 2023-05-23 (×7): 3 g via INTRAVENOUS
  Filled 2023-05-21 (×7): qty 8

## 2023-05-21 MED ORDER — KETAMINE HCL 50 MG/5ML IJ SOSY: 100.0000 mg | PREFILLED_SYRINGE | Freq: Once | INTRAMUSCULAR | Status: AC

## 2023-05-21 MED ORDER — DOCUSATE SODIUM 100 MG PO CAPS: 100.0000 mg | ORAL_CAPSULE | Freq: Two times a day (BID) | ORAL | Status: DC | PRN

## 2023-05-21 MED ORDER — VANCOMYCIN HCL 1500 MG/300ML IV SOLN: 1500.0000 mg | Freq: Once | INTRAVENOUS | Status: DC

## 2023-05-21 MED ORDER — KETAMINE HCL 50 MG/5ML IJ SOSY
PREFILLED_SYRINGE | INTRAMUSCULAR | Status: AC
  Administered 2023-05-21: 50 mg via INTRAVENOUS
  Filled 2023-05-21: qty 5

## 2023-05-21 MED ORDER — FUROSEMIDE 10 MG/ML IJ SOLN
60.0000 mg | Freq: Once | INTRAMUSCULAR | Status: AC
  Administered 2023-05-21: 60 mg via INTRAVENOUS
  Filled 2023-05-21: qty 6

## 2023-05-21 MED ORDER — DEXTROSE 50 % IV SOLN
INTRAVENOUS | Status: AC
  Administered 2023-05-21: 12.5 g via INTRAVENOUS
  Filled 2023-05-21: qty 50

## 2023-05-21 MED ORDER — KETAMINE HCL 10 MG/ML IJ SOLN
INTRAMUSCULAR | Status: AC
  Filled 2023-05-21: qty 1

## 2023-05-21 MED ORDER — KETAMINE HCL 50 MG/ML IJ SOLN: 150.0000 mg | Freq: Once | INTRAMUSCULAR | Status: DC

## 2023-05-21 MED ORDER — VANCOMYCIN HCL IN DEXTROSE 1-5 GM/200ML-% IV SOLN: 1000.0000 mg | Freq: Two times a day (BID) | INTRAVENOUS | Status: DC

## 2023-05-21 MED ORDER — CHLORHEXIDINE GLUCONATE 0.12 % MT SOLN: 15.0000 mL | Freq: Once | OROMUCOSAL | Status: DC

## 2023-05-21 NOTE — Progress Notes (Signed)
eLink Physician-Brief Progress Note Patient Name: Richard Cox DOB: May 09, 1981 MRN: [id number]   Date of Service  05/21/2023  HPI/Events of Note  BP 167/119 On scheduled hydralazine. Missed dose d/t intubation  eICU Interventions  PRN IV hydralazine ordered Continue scheduled PO hydralazine     Intervention Category Minor Interventions: Routine modifications to care plan (e.g. PRN medications for pain, fever)  Taiven Greenley Mechele Collin 05/21/2023, 7:59 PM

## 2023-05-21 NOTE — ED Triage Notes (Signed)
Bib friend. Pt c/o SOB with hx of SOB

## 2023-05-21 NOTE — ED Notes (Signed)
Pt c/o SOB x couple of hours. Pt has increased work of breathing, diaphoretic, O2 sat 78% on RA. Pt reports hx of cancer and CHF. Hasn't taken his Lasix recently.

## 2023-05-21 NOTE — H&P (Signed)
NAME:  Richard Cox, MRN:  [id number], DOB:  1981-09-20, LOS: 0 ADMISSION DATE:  05/21/2023, CONSULTATION DATE:  7/12 REFERRING MD:  [facility] EDP, CHIEF COMPLAINT:  respiratory failure    History of Present Illness:  42yo male with hx CKD, HIV, polysubstance abuse, HTN, CHF presented to [facility] ER 7/12 with SOB after being out of his lasix for 2 days. BNP>4500, lactate 6.4. Refused bipap and in ER and had progressive respiratory distress requiring intubation. Started on broad spectrum abx, diuresis. Given possible need for heart failure team and/or ID, pt tx to [facility] and PCCM asked to admit.    Pertinent  Medical History   Past Medical History:  Diagnosis Date   Acute respiratory failure with hypoxia (HCC) 08/24/2021   Chronic HFrEF (heart failure with reduced ejection fraction) (HCC)    CKD (chronic kidney disease) stage 2, GFR 60-89 ml/min    Endotracheally intubated 08/24/2021   Essential hypertension    HIV infection (HCC)    Lymphoma (HCC) 08/28/2020   Noncompliance with medication regimen    Polysubstance abuse (HCC)    Secondary cardiomyopathy (HCC)      Significant Hospital Events: Including procedures, antibiotic start and stop dates in addition to other pertinent events   CTA chest 7/12>>> 1. No evidence of acute pulmonary embolism. 2. Dense airspace consolidation with air bronchograms occupying much of the left lower lobe compatible with pneumonia. 3. Generalized ground-glass opacity throughout both lungs with interlobular septal thickening. Scattered areas of more focal airspace opacity, most pronounced within the upper lobes. Findings are favored to represent a combination of multifocal pneumonia with pulmonary edema. 4. Small right and trace left pleural effusions. 5. Cardiomegaly with severe left ventricular hypertrophy. 6. Dilated pulmonary trunk, which can be seen in the setting of pulmonary arterial hypertension.  Interim History / Subjective:  Seen on arrival to  ICU  BP improved with sedation    Objective   Blood pressure (!) 189/133, pulse (!) 118, temperature 98.5 F (36.9 C), temperature source Oral, resp. rate (!) 29, height 6\' 1"  (1.854 m), SpO2 100%.    Vent Mode: PRVC FiO2 (%):  [60 %-100 %] 60 % Set Rate:  [20 bmp-24 bmp] 24 bmp Vt Set:  [60 mL-630 mL] 630 mL PEEP:  [5 cmH20-10 cmH20] 5 cmH20  No intake or output data in the 24 hours ending 05/21/23 1342 There were no vitals filed for this visit.  Examination: General: wdwn male NAD sedated post transport  HENT: mm moist, ETT  Lungs: resps even non labored on vent, coarse L>R  Cardiovascular: s1s2 rrr Abdomen: soft, non tender  Extremities: warm and dry, no edema  Neuro: sedated on vent, RASS -2, coughs, opens eyes to loud voice, does not follow commands, MAE   Resolved Hospital Problem list     Assessment & Plan:  Acute respiratory failure r/t pulmonary edema in setting decompensated heart failure partially due to medication noncompliance +/- PNA CAP +/- aspiration PNA   PLAN -  Vent support - 8cc/kg  F/u CXR  F/u ABG Diuresis as below  Unasyn  CD4 >500, will d/c bactrim for now (had been ordered for PCP covg) Respiratory culture   HTN urgency - in setting cocaine abuse. Improved.  PLAN -  Resume home losartan  Holding home B blocker for now in setting cocaine   Systolic heart failure  Pulmonary edema  Cardiomyopathy  Echo 02/2023 EF 25-30%, global hypokinesis, grade 1 diastolic dysfunction  PLAN -  Diuresis as BP  and renal function tol  Trend troponin   HIV/AIDS - followed by ID  CD4 580 PLAN -  Will ask ID to see  Just seen as outpt 7/10 - well controlled, CD4>500  Polysubstance abuse  PLAN -  UDS   CKD - baseline Scr ~1.3 PLAN -  F/u chem   Sedation needs on vent  PLAN - PAD protocol  Daily WUA    Best Practice (right click and "Reselect all SmartList Selections" daily)   Diet/type: NPO DVT prophylaxis: LMWH GI prophylaxis: PPI Lines:  N/A Foley:  Yes, and it is still needed Code Status:  full code Last date of multidisciplinary goals of care discussion [7/12]  Labs   CBC: Recent Labs  Lab 05/21/23 0948  HGB 15.6  HCT 46.0    Basic Metabolic Panel: Recent Labs  Lab 05/19/23 0221 05/21/23 0948 05/21/23 1003  NA 142 144 138  K 3.6 4.1 4.0  CL 106 109 105  CO2 24  --  19*  GLUCOSE 97 173* 178*  BUN 19 17 16   CREATININE 1.34* 1.30* 1.33*  CALCIUM 9.0  --  8.8*   GFR: Estimated Creatinine Clearance: 74.6 mL/min (A) (by C-G formula based on SCr of 1.33 mg/dL (H)). Recent Labs  Lab 05/21/23 1003 05/21/23 1129  LATICACIDVEN 6.4* 1.8    Liver Function Tests: Recent Labs  Lab 05/19/23 0221  AST 17  ALT 8*  BILITOT 0.3  PROT 7.1   No results for input(s): "LIPASE", "AMYLASE" in the last 168 hours. No results for input(s): "AMMONIA" in the last 168 hours.  ABG    Component Value Date/Time   PHART 7.21 (L) 05/21/2023 1146   PCO2ART 62 (H) 05/21/2023 1146   PO2ART 310 (H) 05/21/2023 1146   HCO3 25.2 05/21/2023 1146   TCO2 24 05/21/2023 0948   ACIDBASEDEF 3.9 (H) 05/21/2023 1146   O2SAT 100 05/21/2023 1146     Coagulation Profile: No results for input(s): "INR", "PROTIME" in the last 168 hours.  Cardiac Enzymes: No results for input(s): "CKTOTAL", "CKMB", "CKMBINDEX", "TROPONINI" in the last 168 hours.  HbA1C: Hgb A1c MFr Bld  Date/Time Value Ref Range Status  08/25/2021 03:36 AM 5.1 4.8 - 5.6 % Final    Comment:    (NOTE) Pre diabetes:          5.7%-6.4%  Diabetes:              >6.4%  Glycemic control for   <7.0% adults with diabetes   09/18/2019 12:36 AM 5.5 4.8 - 5.6 % Final    Comment:    (NOTE) Pre diabetes:          5.7%-6.4% Diabetes:              >6.4% Glycemic control for   <7.0% adults with diabetes     CBG: Recent Labs  Lab 05/21/23 0947 05/21/23 1336  GLUCAP 208* 61*    Review of Systems:   Unable, pt sedated on vent. HPI obtained from records and  staff.   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   Surgical History:   Past Surgical History:  Procedure Laterality Date   IR IMAGING GUIDED PORT INSERTION  08/23/2020   Right   NO PAST SURGERIES       Social History:   reports that he has quit smoking. His smoking use included cigarettes. He has never used smokeless tobacco. He reports that he does not currently use alcohol. He reports current drug use. Drugs: Marijuana and Cocaine.   Family History:  His family history includes Cancer in his father and mother; Congestive Heart Failure in his brother; Diabetes Mellitus II in his mother; Heart Problems in his brother; Lupus in his brother.   Allergies Allergies  Allergen Reactions   Motrin [Ibuprofen] Hypertension   Neosporin [Neomycin-Bacitracin Zn-Polymyx] Rash     Home Medications  Prior to Admission medications   Medication Sig Start Date End Date Taking? Authorizing Provider  acetaminophen (TYLENOL) 500 MG tablet Take 1,000 mg by mouth every 6 (six) hours as needed for moderate pain or headache. Patient not taking: Reported on 05/19/2023    [provider]  aspirin 81 MG chewable tablet Chew 1 tablet (81 mg total) by mouth daily. Patient not taking: Reported on 05/19/2023 02/08/23   Kendell Bane, MD  bictegravir-emtricitabine-tenofovir AF (BIKTARVY) 50-200-25 MG TABS tablet Take 1 tablet by mouth daily. 05/19/23   Veryl Speak, FNP  bictegravir-emtricitabine-tenofovir AF (BIKTARVY) 50-200-25 MG TABS tablet Take 1 tablet by mouth daily for 7 days. 05/20/23  05/27/23  Jennette Kettle, RPH-CPP  carvedilol (COREG) 25 MG tablet Take 1 tablet (25 mg total) by mouth 2 (two) times daily with a meal. 02/08/23 05/19/23  Shahmehdi, Gemma Payor, MD  furosemide (LASIX) 80 MG tablet Take 1 tablet (80 mg total) by mouth daily. 05/19/23 08/17/23  Veryl Speak, FNP  hydrALAZINE (APRESOLINE) 100 MG tablet Take 0.5 tablets (50 mg total) by mouth 3 (three) times daily. 02/08/23 06/08/23  Shahmehdi, Gemma Payor, MD  losartan (COZAAR) 50 MG tablet Take 1 tablet (50 mg total) by mouth daily. 02/08/23 05/19/23  Kendell Bane, MD     Critical care time:      Dirk Dress, NP Pulmonary/Critical Care Medicine  05/21/2023  1:42 PM

## 2023-05-21 NOTE — ED Notes (Addendum)
Pt refusing to wear  or NRB saying "he can't breathe". Staff instructed pt on importance of wearing it and the benefits, but he continues to refuse. Unsuccessful IV attempt x 2. EDP aware and at bedside.

## 2023-05-21 NOTE — ED Notes (Signed)
Pt has portacath but refuses to sit back to allow nursing staff to access it. Keeps stating, "just hold a minute". Once again, staff has instructed pt on need for access, but he continues to refuse.

## 2023-05-21 NOTE — Progress Notes (Signed)
RT attempted to obtain sputum but was no successful at this time. Will continue to get throughout the day.

## 2023-05-21 NOTE — ED Notes (Signed)
Patient transported to CT 

## 2023-05-21 NOTE — ED Notes (Addendum)
Pt intubated with 7.5 ETT by EDP, secured at 23cm at the lip by Erie Noe, RT.

## 2023-05-21 NOTE — ED Provider Notes (Signed)
[place] EMERGENCY DEPARTMENT AT [facility] Provider Note   CSN: [id number] Arrival date & time: 05/21/23  0930     History  Chief Complaint  Patient presents with   Shortness of Breath    Richard Cox is a 42 y.o. male.  42 year old male with a history of heart failure with reduced ejection fraction, AIDS, hypertension, polysubstance abuse who presents emergency department in respiratory distress.  Patient reports that has been out of his Lasix for the past 2 days.  This morning was acutely short of breath.  Denies any chest pain.  No history of pulmonary embolism.  No significant lower extremity swelling.  Unable to get additional history due to respiratory distress.       Home Medications Prior to Admission medications   Medication Sig Start Date End Date Taking? Authorizing Provider  acetaminophen (TYLENOL) 500 MG tablet Take 1,000 mg by mouth every 6 (six) hours as needed for moderate pain or headache. Patient not taking: Reported on 05/19/2023    [provider]  aspirin 81 MG chewable tablet Chew 1 tablet (81 mg total) by mouth daily. Patient not taking: Reported on 05/19/2023 02/08/23   Kendell Bane, MD  bictegravir-emtricitabine-tenofovir AF (BIKTARVY) 50-200-25 MG TABS tablet Take 1 tablet by mouth daily. 05/19/23   Veryl Speak, FNP  bictegravir-emtricitabine-tenofovir AF (BIKTARVY) 50-200-25 MG TABS tablet Take 1 tablet by mouth daily for 7 days. 05/20/23 05/27/23  Jennette Kettle, RPH-CPP  carvedilol (COREG) 25 MG tablet Take 1 tablet (25 mg total) by mouth 2 (two) times daily with a meal. 02/08/23 05/19/23  Shahmehdi, Gemma Payor, MD  furosemide (LASIX) 80 MG tablet Take 1 tablet (80 mg total) by mouth daily. 05/19/23 08/17/23  Veryl Speak, FNP  hydrALAZINE (APRESOLINE) 100 MG tablet Take 0.5 tablets (50 mg total) by mouth 3 (three) times daily. 02/08/23 06/08/23  Shahmehdi, Gemma Payor, MD  losartan (COZAAR) 50 MG tablet Take 1 tablet (50 mg total) by  mouth daily. 02/08/23 05/19/23  Kendell Bane, MD      Allergies    Motrin [ibuprofen] and Neosporin [neomycin-bacitracin zn-polymyx]    Review of Systems   Review of Systems  Physical Exam Updated Vital Signs BP (!) 189/133   Pulse (!) 118   Temp 100.3 F (37.9 C) (Rectal)   Resp (!) 29   Ht 6\' 1"  (1.854 m)   SpO2 100%   BMI 20.77 kg/m  Physical Exam Vitals and nursing note reviewed.  Constitutional:      General: He is in acute distress.     Appearance: He is well-developed. He is ill-appearing and diaphoretic.     Comments: Agitated.  Unable to tolerate BiPAP or nonrebreather.  HENT:     Head: Normocephalic and atraumatic.     Right Ear: External ear normal.     Left Ear: External ear normal.     Nose: Nose normal.  Eyes:     Extraocular Movements: Extraocular movements intact.     Conjunctiva/sclera: Conjunctivae normal.     Pupils: Pupils are equal, round, and reactive to light.  Cardiovascular:     Rate and Rhythm: Normal rate and regular rhythm.     Heart sounds: Normal heart sounds.  Pulmonary:     Effort: Respiratory distress present.     Breath sounds: Rhonchi present.  Abdominal:     General: There is no distension.     Palpations: Abdomen is soft. There is no mass.  Tenderness: There is no abdominal tenderness. There is no guarding.  Musculoskeletal:     Cervical back: Normal range of motion and neck supple.     Right lower leg: No edema.     Left lower leg: No edema.  Skin:    General: Skin is warm.  Neurological:     Mental Status: He is alert. Mental status is at baseline.  Psychiatric:        Mood and Affect: Mood normal.        Behavior: Behavior normal.     ED Results / Procedures / Treatments   Labs (all labs ordered are listed, but only abnormal results are displayed) Labs Reviewed  BASIC METABOLIC PANEL - Abnormal; Notable for the following components:      Result Value   CO2 19 (*)    Glucose, Bld 178 (*)    Creatinine, Ser  1.33 (*)    Calcium 8.8 (*)    All other components within normal limits  BRAIN NATRIURETIC PEPTIDE - Abnormal; Notable for the following components:   B Natriuretic Peptide >4,500.0 (*)    All other components within normal limits  LACTIC ACID, PLASMA - Abnormal; Notable for the following components:   Lactic Acid, Venous 6.4 (*)    All other components within normal limits  LACTATE DEHYDROGENASE - Abnormal; Notable for the following components:   LDH 225 (*)    All other components within normal limits  BLOOD GAS, ARTERIAL - Abnormal; Notable for the following components:   pH, Arterial 7.21 (*)    pCO2 arterial 62 (*)    pO2, Arterial 310 (*)    Acid-base deficit 3.9 (*)    All other components within normal limits  CBG MONITORING, ED - Abnormal; Notable for the following components:   Glucose-Capillary 208 (*)    All other components within normal limits  I-STAT CHEM 8, ED - Abnormal; Notable for the following components:   Creatinine, Ser 1.30 (*)    Glucose, Bld 173 (*)    All other components within normal limits  TROPONIN I (HIGH SENSITIVITY) - Abnormal; Notable for the following components:   Troponin I (High Sensitivity) 164 (*)    All other components within normal limits  TROPONIN I (HIGH SENSITIVITY) - Abnormal; Notable for the following components:   Troponin I (High Sensitivity) 154 (*)    All other components within normal limits  CULTURE, BLOOD (ROUTINE X 2)  CULTURE, BLOOD (ROUTINE X 2)  RESP PANEL BY RT-PCR (RSV, FLU A&B, COVID)  RVPGX2  MRSA NEXT GEN BY PCR, NASAL  LACTIC ACID, PLASMA    EKG EKG Interpretation Date/Time:  Friday May 21 2023 11:00:12 EDT Ventricular Rate:  132 PR Interval:  144 QRS Duration:  102 QT Interval:  344 QTC Calculation: 510 R Axis:   82  Text Interpretation: Sinus tachycardia LAE, consider biatrial enlargement Left ventricular hypertrophy Nonspecific T abnormalities, inferior leads ST elevation, consider anterior injury  Prolonged QT interval Confirmed by Vonita Moss [phone]) on 05/21/2023 11:03:21 AM  Radiology CT Angio Chest PE W/Cm &/Or Wo Cm  Result Date: 05/21/2023 CLINICAL DATA:  Pulmonary embolism (PE) suspected, high prob EXAM: CT ANGIOGRAPHY CHEST WITH CONTRAST TECHNIQUE: Multidetector CT imaging of the chest was performed using the standard protocol during bolus administration of intravenous contrast. Multiplanar CT image reconstructions and MIPs were obtained to evaluate the vascular anatomy. RADIATION DOSE REDUCTION: This exam was performed according to the departmental dose-optimization program which includes automated exposure control, adjustment  of the mA and/or kV according to patient size and/or use of iterative reconstruction technique. CONTRAST:  75mL OMNIPAQUE IOHEXOL 350 MG/ML SOLN COMPARISON:  08/30/2022 FINDINGS: Cardiovascular: Satisfactory opacification of the pulmonary arteries. No filling defect identified to the segmental branch level. Distal pulmonary arterial branches are degraded by respiratory motion artifact. Pulmonary trunk is dilated at 3.9 cm in diameter. Thoracic aorta is nonaneurysmal. Cardiomegaly with severe left ventricular hypertrophy. Right-sided chest port in place terminating within the SVC. Mediastinum/Nodes: Endotracheal and enteric tubes are appropriately positioned. Several mildly prominent mediastinal lymph nodes are similar in appearance to prior. No axillary lymphadenopathy. Lungs/Pleura: Dense airspace consolidation with air bronchograms occupying much of the left lower lobe. Generalized ground-glass opacity throughout both lungs with interlobular septal thickening. Scattered areas of airspace opacity, more pronounced within the upper lobes. Small right and trace left pleural effusions. No pneumothorax. Upper Abdomen: No acute abnormality. Musculoskeletal: No chest wall abnormality. No acute or significant osseous findings. Review of the MIP images confirms the above  findings. IMPRESSION: 1. No evidence of acute pulmonary embolism. 2. Dense airspace consolidation with air bronchograms occupying much of the left lower lobe compatible with pneumonia. 3. Generalized ground-glass opacity throughout both lungs with interlobular septal thickening. Scattered areas of more focal airspace opacity, most pronounced within the upper lobes. Findings are favored to represent a combination of multifocal pneumonia with pulmonary edema. 4. Small right and trace left pleural effusions. 5. Cardiomegaly with severe left ventricular hypertrophy. 6. Dilated pulmonary trunk, which can be seen in the setting of pulmonary arterial hypertension. Electronically Signed   By: Duanne Guess D.O.   On: 05/21/2023 12:52   DG Chest Port 1 View  Result Date: 05/21/2023 CLINICAL DATA:  [id number] Encounter for orogastric (OG) tube placement [id number] EXAM: PORTABLE CHEST 1 VIEW COMPARISON:  CXR 05/21/23 FINDINGS: Enteric tube courses below diaphragm with tip and side hole projecting over the expected location of the stomach. Right-sided central venous catheter terminates in the lower SVC. No pleural effusion. No pneumothorax. Cardiomegaly. Mild pulmonary edema. IMPRESSION: Enteric tube courses below diaphragm with tip and side hole projecting over the expected location of the stomach. Electronically Signed   By: Lorenza Cambridge M.D.   On: 05/21/2023 12:19   DG Abdomen 1 View  Result Date: 05/21/2023 CLINICAL DATA:  og tube EXAM: ABDOMEN - 1 VIEW COMPARISON:  CT scan abdomen and pelvis from 07/24/2020. FINDINGS: The bowel gas pattern is non-obstructive. No evidence of pneumoperitoneum, within the limitations of a supine film. No acute osseous abnormalities. There are heterogeneous opacities throughout visualized bilateral lungs, which is nonspecific. Please note, patient is scheduled for CT angiography of the chest. Surgical changes, devices, tubes and lines: Enteric tube is seen with its tip overlying the left  upper quadrant, within the fundus of the stomach. However, the side hole remains just above the left hemidiaphragm. Please further advance the enteric tube by 4-6 inches to confidently port the side hole into the stomach. IMPRESSION: 1. Enteric tube with its tip overlying the left upper quadrant, within the fundus of the stomach. However, the side hole remains just above the left hemidiaphragm. Please further advance the enteric tube by 4-6 inches to confidently put the side hole into the stomach. Electronically Signed   By: Jules Schick M.D.   On: 05/21/2023 11:19   DG Chest Port 1 View  Result Date: 05/21/2023 CLINICAL DATA:  Shortness of breath, post intubation and OG tube placement. EXAM: PORTABLE CHEST 1 VIEW COMPARISON:  Chest x-ray dated  02/07/2023 FINDINGS: Endotracheal tube appears adequately positioned with tip at the level of the clavicles and approximately 6 cm above the carina. Enteric tube passes to the lower mediastinum (incompletely imaged). RIGHT chest wall Port-A-Cath in place with tip adequately positioned at the level of the mid/upper SVC. Bilateral diffuse interstitial opacities, and scattered airspace opacities, are stable. No new lung findings. No pneumothorax is seen. Heart size and mediastinal contours appear stable. IMPRESSION: 1. Endotracheal tube appears adequately positioned, approximately 6 cm above the carina. 2. Enteric tube passes to the lower mediastinum (incompletely imaged). 3. Stable bilateral diffuse interstitial opacities and scattered airspace opacities, compatible with pulmonary edema and/or pneumonia. Electronically Signed   By: Bary Richard M.D.   On: 05/21/2023 11:07    Procedures Procedure Name: Awake intubation Date/Time: 05/21/2023 12:59 PM  Performed by: Rondel Baton, MDPre-anesthesia Checklist: Suction available, Emergency Drugs available, Patient identified and Patient being monitored Oxygen Delivery Method: Ambu bag Preoxygenation:  Pre-oxygenation with 100% oxygen Induction Type: IV induction Ventilation: Mask ventilation without difficulty Laryngoscope Size: Glidescope and 4 Grade View: Grade I Tube size: 7.5 mm Number of attempts: 1 Airway Equipment and Method: Rigid stylet and Video-laryngoscopy Placement Confirmation: ETT inserted through vocal cords under direct vision, Positive ETCO2 and CO2 detector Secured at: 23 cm Tube secured with: ETT holder Difficulty Due To: Difficulty was unanticipated        EMERGENCY DEPARTMENT [place] CARDIAC EXAM "Study: Limited Ultrasound of the Heart and Pericardium"  INDICATIONS:Dyspnea Multiple views of the heart and pericardium were obtained in real-time with a multi-frequency probe.  PERFORMED ZO:XWRUEA IMAGES ARCHIVED?: No LIMITATIONS:   Respiratory distress VIEWS USED: Parasternal long axis and Parasternal short axis INTERPRETATION: Cardiac activity present, Pericardial effusioin absent, and Decreased contractility  EMERGENCY DEPARTMENT [place] LUNG EXAM "Study: Limited Ultrasound of the Lung and Thorax"  INDICATIONS: Dyspnea Multiple views of both lungs using sagittal orientation were obtained.  PERFORMED BY: Myself IMAGES ARCHIVED?: No LIMITATIONS: Respiratory distress VIEWS USED: Posterior lung fields INTERPRETATION: No pneumothorax, Pulmonary edema, B Lines      Medications Ordered in ED Medications  propofol (DIPRIVAN) 1000 MG/100ML infusion (80 mcg/kg/min  71.4 kg Intravenous Rate/Dose Change 05/21/23 1137)  ceFEPIme (MAXIPIME) 2 g in sodium chloride 0.9 % 100 mL IVPB (has no administration in time range)  sulfamethoxazole-trimethoprim (BACTRIM) 356.96 mg of trimethoprim in dextrose 5 % 500 mL IVPB (has no administration in time range)  vancomycin (VANCOREADY) IVPB 1500 mg/300 mL (has no administration in time range)  vancomycin (VANCOCIN) IVPB 1000 mg/200 mL premix (has no administration in time range)  fentaNYL in NS (54mcg/ml)  infusion-PREMIX (100 mcg/hr Intravenous New Bag/Given 05/21/23 1154)  diazepam (VALIUM) injection 5 mg (5 mg Intramuscular Given 05/21/23 1003)  ketamine 50 mg in normal saline 5 mL (10 mg/mL) syringe (100 mg Intravenous Given 05/21/23 1031)  ketamine 50 mg in normal saline 5 mL (10 mg/mL) syringe (50 mg Intravenous Given 05/21/23 1038)  succinylcholine (ANECTINE) syringe 100 mg (100 mg Intravenous Given 05/21/23 1040)  fentaNYL (SUBLIMAZE) injection 100 mcg (100 mcg Intravenous Given 05/21/23 1116)  iohexol (OMNIPAQUE) 350 MG/ML injection 75 mL (75 mLs Intravenous Contrast Given 05/21/23 1224)  furosemide (LASIX) injection 60 mg (60 mg Intravenous Given 05/21/23 1214)    ED Course/ Medical Decision Making/ A&P Clinical Course as of 05/21/23 1307  Fri May 21, 2023  [address] Dr Merrily Pew from [facility] cone ICU to admit the patient [RP]    Clinical Course User Index [RP] Rondel Baton, MD  Medical Decision Making Amount and/or Complexity of Data Reviewed Labs: ordered. Radiology: ordered.  Risk Prescription drug management. Decision regarding hospitalization.   Khallid Knabb is a 42 y.o. male with comorbidities that complicate the patient evaluation including history of heart failure with reduced ejection fraction, AIDS, hypertension, polysubstance abuse who presents emergency department in respiratory distress.    Initial Ddx:  PE, pulmonary edema, pneumonia, URI, hypoxia  MDM/Course:  Patient arrived to the emergency department respiratory distress.  Attempted BiPAP and IM Valium but the patient did not tolerate it.  On exam does have diffuse rhonchi.  Able to only get a limited history from him due to the respiratory distress that he is in but given his severe hypertension was initially concerned about possible flash pulmonary edema.  Patient remained hypoxic in the 80s despite nonrebreather.  Wound care ultrasound showed diffuse B-lines and decreased cardiac  contractility.  He was taken back to the recess room and was intubated using ketamine and rocuronium.  Had a chest x-ray which shows pulmonary edema versus pneumonia.  Blood cultures were sent and the patient was started on vancomycin, cefepime, and Bactrim to cover him for any resistant organisms given his AIDS.  Upon re-evaluation patient had copious frothy secretions that were suctioned.  Was given a dose of Lasix for his pulmonary edema.  Patient not yet started on nitroglycerin drip but may benefit from this as well.  Discussed with ICU at [facility] who will admit the patient.  This patient presents to the ED for concern of complaints listed in HPI, this involves an extensive number of treatment options, and is a complaint that carries with it a high risk of complications and morbidity. Disposition including potential need for admission considered.   Dispo: ICU  Records reviewed Outpatient Clinic Notes The following labs were independently interpreted: Chemistry and show no acute abnormality I independently reviewed the following imaging with scope of interpretation limited to determining acute life threatening conditions related to emergency care: Chest x-ray and agree with the radiologist interpretation with the following exceptions: none I personally reviewed and interpreted cardiac monitoring: sinus tachycardia I personally reviewed and interpreted the pt's EKG: see above for interpretation  I have reviewed the patients home medications and made adjustments as needed Consults: Critical care   CRITICAL CARE Performed by: Rondel Baton   Total critical care time: 60 minutes  Critical care time was exclusive of separately billable procedures and treating other patients.  Critical care was necessary to treat or prevent imminent or life-threatening deterioration.  Critical care was time spent personally by me on the following activities: development of treatment plan with patient  and/or surrogate as well as nursing, discussions with consultants, evaluation of patient's response to treatment, examination of patient, obtaining history from patient or surrogate, ordering and performing treatments and interventions, ordering and review of laboratory studies, ordering and review of radiographic studies, pulse oximetry and re-evaluation of patient's condition.        Final Clinical Impression(s) / ED Diagnoses Final diagnoses:  Hypoxia  Respiratory distress  Pneumonia due to infectious organism, unspecified laterality, unspecified part of lung  Systolic heart failure, unspecified HF chronicity (HCC)  AIDS (acquired immune deficiency syndrome) (HCC)    Rx / DC Orders ED Discharge Orders     None         Rondel Baton, MD 05/21/23 1307

## 2023-05-21 NOTE — Consult Note (Signed)
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 Course complicated by rapid respiratory distress requiring positive pressure ventillation   Uds/tox screen positive for benzo, opiate, cocaine, and marijuanna  7/12 bcx in progress 7/12 flu/rsv/covid pcr negative  #pna #substance abuse #hypoxic resp failure Mixed picture of likely cocaine induced pulm edema and bacteria pna ?aspiration. Has hx HFrEF and could have an acute exacerbation with his cocaine use and/or infection. Admitting sbp was 200  With good cd4 and compliance with ART low suspicion for OI  Query if atypical coverage needed but for now can continue on amp-sulbactam. If clinical decompensation would consider adding doxy or azithromycin    #hiv Lab Results  Component Value Date   CD4TCELL 32 (L) 05/19/2023   CD4TABS 580 05/19/2023   Lab Results  Component Value Date   HIV1RNAQUANT 52 (H) 05/19/2023   Well controlled on biktarvy Last seen by Marcos Eke in rcid on 05/19/23 and has been compliant with biktarvy   #HRrEF #hypertensive urgency #troponinemia Likely some degree of acute chf exacerbation on presentation as mentioned above Bnp >4500 higher than baseline Demand ischemia likely with his elevated troponin Management per primary team   Plan: No iv formulation of biktarvy Could hold biktarvy for a few days until he is extubated and then restart when taking oral If more than a week anticipated of not able to take oral, then can do  lamuvidine, dolutegravir, and abacavir into dobhoff tube F/u blood culture If blood cx positive might need to remove the port on the right chest Agree 5-7 days amp-sulbactam  If in 2-3 days no clinical improvement would add atypical coverage with azithromycin or doxycycline Chf management per pulm/ccm team Discussed with primary team     ------------------------------------------------ Principal Problem:   Hypoxia Active Problems:   Acute respiratory failure (HCC)    HPI: Richard Cox is a 42 y.o. 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 Course complicated by rapid respiratory distress requiring positive pressure ventillation  Hx via chart and his parents (live next door) who were at bedside  Limited hx still as patient intubated Per ed note 2 days dyspnea/trouble breathing. Parents las spoke with him in the morning of admission and didn't report and sx that patient was complaining about  He had run out of lasix per ed note.  On presentation, low grade fever, sbp 200s, chest cta showed pulm edema and lll opacity Progressive hypoxic resp failure so intubated and admitted to icu   2 days ago saw Tammy Sours at [facility] clinic. Appear hiv controlled with biktarvy  Hx dlbcl chemo in past and still have right chest port      Family History  Problem Relation Age of Onset   Diabetes Mellitus II Mother    Cancer Mother        multiple myeloma   Cancer Father        multiple myeloma   Congestive Heart Failure Brother    Heart Problems Brother    Lupus Brother     Social History   Tobacco Use   Smoking status: Former    Current packs/day: 0.25    Types: Cigarettes   Smokeless tobacco: Never  Vaping Use   Vaping status: Never Used  Substance  Use Topics   Alcohol use: Not Currently   Drug use: Yes    Types: Marijuana, Cocaine    Allergies  Allergen Reactions   Motrin [Ibuprofen] Hypertension   Neosporin [Neomycin-Bacitracin Zn-Polymyx] Rash    Review of Systems: ROS All Other ROS was negative, except mentioned above   Past Medical History:  Diagnosis Date   Acute respiratory failure with hypoxia (HCC) 08/24/2021   Chronic HFrEF (heart failure with reduced ejection fraction) (HCC)    CKD (chronic kidney disease) stage 2, GFR 60-89 ml/min    Endotracheally intubated 08/24/2021   Essential hypertension    HIV infection (HCC)    Lymphoma (HCC) 08/28/2020   Noncompliance with medication regimen    Polysubstance abuse (HCC)    Secondary cardiomyopathy (HCC)        Scheduled Meds:  aspirin  81 mg Per Tube Daily   enoxaparin (LOVENOX) injection  40 mg Subcutaneous Daily   famotidine  20 mg Per Tube BID   furosemide  40 mg Intravenous BID   hydrALAZINE  50 mg Per Tube Q8H   losartan  50 mg Per Tube Daily   mouth rinse  15 mL Mouth Rinse Q2H   polyethylene glycol  17 g Per Tube Daily   Continuous Infusions:  ampicillin-sulbactam (UNASYN) IV 200 mL/hr at 05/21/23 1555   fentaNYL infusion INTRAVENOUS 200 mcg/hr (05/21/23 1555)   propofol (DIPRIVAN) infusion 40 mcg/kg/min (05/21/23 1555)   PRN Meds:.ondansetron (ZOFRAN) IV, mouth rinse   OBJECTIVE: Blood pressure 106/73, pulse 74, temperature 98.2 F (36.8 C), temperature source Oral, resp. rate (!) 27, height 6\' 1"  (1.854 m), weight 72 kg, SpO2 99%.  Physical Exam General/constitutional: ill appearing, on mechanical ventillation HEENT: Normocephalic, PER, Conj Clear Neck supple CV: rrr no mrg Lungs: prvc mode ventillation; comfortable on vent Abd: Soft Ext: no edema Skin: No Rash Neuro: deferred as sedated/intubated MSK: no peripheral joint swelling/tenderness/warmth  Central line presence: right chest exposed port -- site without  fluctuance/erythema    Lab Results Lab Results  Component Value Date   WBC 4.3 02/08/2023   HGB 15.6 05/21/2023   HCT 46.0 05/21/2023   MCV 93.5 02/08/2023   PLT 145 (L) 02/08/2023    Lab Results  Component Value Date   CREATININE 1.33 (H) 05/21/2023   BUN 16 05/21/2023   NA 138 05/21/2023   K 4.0 05/21/2023   CL 105 05/21/2023   CO2 19 (L) 05/21/2023    Lab Results  Component Value Date   ALT 8 (L) 05/19/2023   AST 17 05/19/2023   ALKPHOS 84 09/04/2022   BILITOT 0.3 05/19/2023      Microbiology: Recent Results (from the past 240 hour(s))  Resp panel by RT-PCR (RSV, Flu A&B, Covid) Anterior Nasal Swab     Status: None   Collection Time: 05/21/23 11:15 AM   Specimen: Anterior Nasal Swab  Result Value Ref Range Status   SARS Coronavirus  2 by RT PCR NEGATIVE NEGATIVE Final    Comment: (NOTE) SARS-CoV-2 target nucleic acids are NOT DETECTED.  The SARS-CoV-2 RNA is generally detectable in upper respiratory specimens during the acute phase of infection. The lowest concentration of SARS-CoV-2 viral copies this assay can detect is 138 copies/mL. A negative result does not preclude SARS-Cov-2 infection and should not be used as the sole basis for treatment or other patient management decisions. A negative result may occur with  improper specimen collection/handling, submission of specimen other than nasopharyngeal swab, presence of viral mutation(s) within the areas targeted by this assay, and inadequate number of viral copies(<138 copies/mL). A negative result must be combined with clinical observations, patient history, and epidemiological information. The expected result is Negative.  Fact Sheet for Patients:  BloggerCourse.com  Fact Sheet for Healthcare Providers:  SeriousBroker.it  This test is no t yet approved or cleared by the [place] FDA and  has been authorized for detection and/or diagnosis of  SARS-CoV-2 by FDA under an Emergency Use Authorization (EUA). This EUA will remain  in effect (meaning this test can be used) for the duration of the COVID-19 declaration under Section 564(b)(1) of the Act, 21 U.S.C.section 360bbb-3(b)(1), unless the authorization is terminated  or revoked sooner.       Influenza A by PCR NEGATIVE NEGATIVE Final   Influenza B by PCR NEGATIVE NEGATIVE Final    Comment: (NOTE) The Xpert Xpress SARS-CoV-2/FLU/RSV plus assay is intended as an aid in the diagnosis of influenza from Nasopharyngeal swab specimens and should not be used as a sole basis for treatment. Nasal washings and aspirates are unacceptable for Xpert Xpress SARS-CoV-2/FLU/RSV testing.  Fact Sheet for Patients: BloggerCourse.com  Fact Sheet for Healthcare Providers: SeriousBroker.it  This test is not yet approved or cleared by the [place] FDA and has been authorized for detection and/or diagnosis of SARS-CoV-2 by FDA under an Emergency Use Authorization (EUA). This EUA will remain in effect (meaning this test can be used) for the duration of the COVID-19 declaration under Section 564(b)(1) of the Act, 21 U.S.C. section 360bbb-3(b)(1), unless the authorization is terminated or revoked.     Resp Syncytial Virus by PCR NEGATIVE NEGATIVE Final    Comment: (NOTE) Fact Sheet for Patients: BloggerCourse.com  Fact Sheet for Healthcare Providers: SeriousBroker.it  This test is not yet approved or cleared by the [place] FDA and has been authorized for detection and/or diagnosis of SARS-CoV-2 by FDA under an Emergency Use Authorization (EUA). This EUA will remain in effect (meaning this test can be used) for the duration of the COVID-19 declaration under Section 564(b)(1) of the Act, 21 U.S.C. section 360bbb-3(b)(1), unless the authorization is terminated  or revoked.  Performed at [facility], [address] [place], [place] [address]   Blood culture (routine x 2)     Status: None (Preliminary result)   Collection Time: 05/21/23 11:29 AM   Specimen: Blood  Result Value Ref Range Status   Specimen Description BLOOD LEFT ANTECUBITAL  Final   Special Requests   Final    BOTTLES DRAWN AEROBIC AND ANAEROBIC Blood Culture adequate volume Performed at [facility], [address], [place], [place] [address]    Culture PENDING  Incomplete   Report Status PENDING  Incomplete  Blood culture (routine x 2)     Status: None (Preliminary result)   Collection Time: 05/21/23 11:29 AM   Specimen: Blood  Result Value Ref Range Status   Specimen Description   Final  BLOOD LEFT HAND BOTTLES DRAWN AEROBIC AND ANAEROBIC   Special Requests   Final    Blood Culture adequate volume Performed at [facility], [address], [place], [place] [address]    Culture PENDING  Incomplete   Report Status PENDING  Incomplete     Serology:    Imaging: If present, new imagings (plain films, ct scans, and mri) have been personally visualized and interpreted; radiology reports have been reviewed. Decision making incorporated into the Impression / Recommendations.  7/12 chest cta 1. No evidence of acute pulmonary embolism. 2. Dense airspace consolidation with air bronchograms occupying much of the left lower lobe compatible with pneumonia. 3. Generalized ground-glass opacity throughout both lungs with interlobular septal thickening. Scattered areas of more focal airspace opacity, most pronounced within the upper lobes. Findings are favored to represent a combination of multifocal pneumonia with pulmonary edema. 4. Small right and trace left pleural effusions. 5. Cardiomegaly with severe left ventricular hypertrophy. 6. Dilated pulmonary trunk, which can be seen in the setting of pulmonary arterial hypertension.    Raymondo Band, MD [facility] [phone] pager    05/21/2023, 4:01 PM

## 2023-05-21 NOTE — Progress Notes (Signed)
1610 Patient is anxious and unwilling to wear BiPAP at this time, even refusing NRB and [place]. RN is currently doing blow-by with NRB, as it is the only type of oxygen the patient is agreeable to.

## 2023-05-21 NOTE — Progress Notes (Signed)
Dr. Allena Katz notified of critcal troponin of 179

## 2023-05-21 NOTE — ED Notes (Signed)
Pt continues to refuse, bipap, [place], NRB. Blow by oxygen given with NRB which pt tolerates okay.

## 2023-05-21 NOTE — Progress Notes (Addendum)
Pharmacy Antibiotic Note  Richard Cox is a 42 y.o. male hx HIV admitted on 05/21/2023 with pneumonia.  Pharmacy has been consulted for Vancomycin dosing. Cefepime x1 dose given in ED  Kinetic parameters: target AUC 528, Vd 0.72L/kg, ke 0.074  Plan: Vancomycin 1500mg  IV x 1, then Vancomycin 1gm IV q 12 Monitor renal function, cultures, and levels as indicated De-escalation when appropriate.     No data recorded.  Recent Labs  Lab 05/19/23 0221 05/21/23 0948 05/21/23 1003  CREATININE 1.34* 1.30* 1.33*  LATICACIDVEN  --   --  6.4*    Estimated Creatinine Clearance: 74.6 mL/min (A) (by C-G formula based on SCr of 1.33 mg/dL (H)).    Allergies  Allergen Reactions   Motrin [Ibuprofen] Hypertension   Neosporin [Neomycin-Bacitracin Zn-Polymyx] Rash    Antimicrobials this admission: 7/12 Cefepime x1  Microbiology results: 7/12 BCx: P 7/12 Resp Panel P  Thank you for allowing pharmacy to be a part of this patient's care.  Caryl Asp, PharmD Clinical Pharmacist 05/21/2023 11:16 AM

## 2023-05-22 ENCOUNTER — Other Ambulatory Visit: Payer: Self-pay | Admitting: Pulmonary Disease

## 2023-05-22 DIAGNOSIS — R0902 Hypoxemia: Secondary | ICD-10-CM | POA: Diagnosis not present

## 2023-05-22 LAB — BASIC METABOLIC PANEL
Anion gap: 8 (ref 5–15)
Anion gap: 12 (ref 5–15)
BUN: 21 mg/dL — ABNORMAL HIGH (ref 6–20)
BUN: 20 mg/dL (ref 6–20)
CO2: 25 mmol/L (ref 22–32)
CO2: 26 mmol/L (ref 22–32)
Calcium: 8 mg/dL — ABNORMAL LOW (ref 8.9–10.3)
Calcium: 8.5 mg/dL — ABNORMAL LOW (ref 8.9–10.3)
Chloride: 109 mmol/L (ref 98–111)
Chloride: 106 mmol/L (ref 98–111)
Creatinine, Ser: 1.63 mg/dL — ABNORMAL HIGH (ref 0.61–1.24)
Creatinine, Ser: 1.68 mg/dL — ABNORMAL HIGH (ref 0.61–1.24)
GFR, Estimated: 54 mL/min — ABNORMAL LOW (ref >60)
GFR, Estimated: 52 mL/min — ABNORMAL LOW (ref >60)
Glucose, Bld: 82 mg/dL (ref 70–99)
Glucose, Bld: 85 mg/dL (ref 70–99)
Potassium: 3.5 mmol/L (ref 3.5–5.1)
Potassium: 4 mmol/L (ref 3.5–5.1)
Sodium: 142 mmol/L (ref 135–145)
Sodium: 144 mmol/L (ref 135–145)

## 2023-05-22 LAB — CBC
HCT: 33.2 % — ABNORMAL LOW (ref 39.0–52.0)
Hemoglobin: 10.9 g/dL — ABNORMAL LOW (ref 13.0–17.0)
MCH: 30 pg (ref 26.0–34.0)
MCHC: 32.8 g/dL (ref 30.0–36.0)
MCV: 91.5 fL (ref 80.0–100.0)
Platelets: 123 10*3/uL — ABNORMAL LOW (ref 150–400)
RBC: 3.63 MIL/uL — ABNORMAL LOW (ref 4.22–5.81)
RDW: 18.1 % — ABNORMAL HIGH (ref 11.5–15.5)
WBC: 4.8 10*3/uL (ref 4.0–10.5)
nRBC: 0 % (ref 0.0–0.2)

## 2023-05-22 LAB — GLUCOSE, CAPILLARY
Glucose-Capillary: 105 mg/dL — ABNORMAL HIGH (ref 70–99)
Glucose-Capillary: 100 mg/dL — ABNORMAL HIGH (ref 70–99)

## 2023-05-22 LAB — PHOSPHORUS
Phosphorus: 3.5 mg/dL (ref 2.5–4.6)
Phosphorus: 3.8 mg/dL (ref 2.5–4.6)

## 2023-05-22 LAB — MRSA NEXT GEN BY PCR, NASAL: MRSA by PCR Next Gen: DETECTED — AB

## 2023-05-22 LAB — TRIGLYCERIDES: Triglycerides: 167 mg/dL — ABNORMAL HIGH (ref <150)

## 2023-05-22 LAB — MAGNESIUM
Magnesium: 2.1 mg/dL (ref 1.7–2.4)
Magnesium: 2.2 mg/dL (ref 1.7–2.4)

## 2023-05-22 MED ORDER — ASPIRIN 81 MG PO CHEW
81.0000 mg | CHEWABLE_TABLET | Freq: Every day | ORAL | Status: DC
  Administered 2023-05-23 – 2023-05-24 (×2): 81 mg via ORAL
  Filled 2023-05-22 (×2): qty 1

## 2023-05-22 MED ORDER — FAMOTIDINE 20 MG PO TABS
20.0000 mg | ORAL_TABLET | Freq: Every day | ORAL | Status: DC
  Administered 2023-05-23 – 2023-05-24 (×2): 20 mg via ORAL
  Filled 2023-05-22 (×2): qty 1

## 2023-05-22 MED ORDER — POTASSIUM CHLORIDE 20 MEQ PO PACK
40.0000 meq | PACK | Freq: Once | ORAL | Status: AC
  Administered 2023-05-22: 40 meq
  Filled 2023-05-22: qty 2

## 2023-05-22 MED ORDER — LORAZEPAM 1 MG PO TABS: 1.0000 mg | ORAL_TABLET | Freq: Four times a day (QID) | ORAL | Status: DC | PRN

## 2023-05-22 MED ORDER — FAMOTIDINE 20 MG PO TABS
20.0000 mg | ORAL_TABLET | Freq: Every day | ORAL | Status: DC
  Administered 2023-05-22: 20 mg
  Filled 2023-05-22: qty 1

## 2023-05-22 MED ORDER — LOSARTAN POTASSIUM 50 MG PO TABS
50.0000 mg | ORAL_TABLET | Freq: Every day | ORAL | Status: DC
  Administered 2023-05-23 – 2023-05-24 (×2): 50 mg via ORAL
  Filled 2023-05-22 (×2): qty 1

## 2023-05-22 MED ORDER — PROCHLORPERAZINE EDISYLATE 10 MG/2ML IJ SOLN: 10.0000 mg | Freq: Four times a day (QID) | INTRAMUSCULAR | Status: DC | PRN

## 2023-05-22 MED ORDER — MUPIROCIN 2 % EX OINT
1.0000 | TOPICAL_OINTMENT | Freq: Two times a day (BID) | CUTANEOUS | Status: DC
  Administered 2023-05-22 – 2023-05-24 (×5): 1 via NASAL
  Filled 2023-05-22 (×2): qty 22

## 2023-05-22 MED ORDER — LABETALOL HCL 5 MG/ML IV SOLN
10.0000 mg | INTRAVENOUS | Status: DC | PRN
  Administered 2023-05-22 – 2023-05-24 (×8): 10 mg via INTRAVENOUS
  Filled 2023-05-22 (×6): qty 4

## 2023-05-22 MED ORDER — PROSOURCE TF20 ENFIT COMPATIBL EN LIQD: 60.0000 mL | Freq: Every day | ENTERAL | Status: DC

## 2023-05-22 MED ORDER — VITAL HIGH PROTEIN PO LIQD: 1000.0000 mL | ORAL | Status: DC

## 2023-05-22 MED ORDER — HYDRALAZINE HCL 50 MG PO TABS
50.0000 mg | ORAL_TABLET | Freq: Three times a day (TID) | ORAL | Status: DC
  Administered 2023-05-22 – 2023-05-24 (×6): 50 mg via ORAL
  Filled 2023-05-22 (×5): qty 1

## 2023-05-22 NOTE — Progress Notes (Signed)
Called to evaluate patient  Had been weaning relatively well Alert and interactive, following commands  Decision made to extubate

## 2023-05-22 NOTE — Progress Notes (Addendum)
eLink Physician-Brief Progress Note Patient Name: Richard Cox DOB: 10-05-1981 MRN: [id number]   Date of Service  05/22/2023  HPI/Events of Note  Notified of persistent nausea despite zofran.  Pt vomited again after a dose of zofran given.   eICU Interventions  Compazine IV PRN ordered.     Intervention Category Intermediate Interventions: Other:  Larinda Buttery 05/22/2023, 8:43 PM  9:43 PM Pt passed bedside swallow evaluation.   Plan> Ok to take sips with meds.

## 2023-05-22 NOTE — Progress Notes (Signed)
Pharmacy Antibiotic Note  Richard Cox is a 42 y.o. male admitted on 05/21/2023 with pneumonia.  Pharmacy has been consulted for Unasyn dosing. SCr 1.68 - trending up.  Plan: Continue Unasyn 3g IV q6h Continue to monitor SCr trend and adjust as needed.  Follow-up length of therapy  Height: 6\' 1"  (185.4 cm) Weight: 70 kg (154 lb 5.2 oz) IBW/kg (Calculated) : 79.9  Temp (24hrs), Avg:98.5 F (36.9 C), Min:97.7 F (36.5 C), Max:100.3 F (37.9 C)  Recent Labs  Lab 05/19/23 0221 05/21/23 0948 05/21/23 1003 05/21/23 1129 05/21/23 1350 05/22/23 0348 05/22/23 0635 05/22/23 1007  WBC  --   --   --   --   --   --  4.8  --   CREATININE 1.34* 1.30* 1.33*  --   --  1.63*  --  1.68*  LATICACIDVEN  --   --  6.4* 1.8 1.0  --   --   --     Estimated Creatinine Clearance: 57.9 mL/min (A) (by C-G formula based on SCr of 1.68 mg/dL (H)).    Allergies  Allergen Reactions   Motrin [Ibuprofen] Hypertension   Neosporin [Neomycin-Bacitracin Zn-Polymyx] Rash    Antimicrobials this admission: Unasyn 7/12 >>  Dose adjustments this admission:   Microbiology results: 7/12 Bcx: ordered 7/13 TA:   Thank you for allowing pharmacy to be a part of this patient's care.  Link Snuffer, PharmD, BCPS, BCCCP Clinical Pharmacist 05/22/2023 11:03 AM

## 2023-05-22 NOTE — Plan of Care (Signed)

## 2023-05-22 NOTE — Procedures (Signed)
Extubation Procedure Note  Patient Details:   Name: Richard Cox DOB: 1981-02-13 MRN: [id number]   Airway Documentation:    Vent end date: 05/22/23 Vent end time: 1520   Evaluation  O2 sats: stable throughout Complications: No apparent complications Patient did tolerate procedure well. Bilateral Breath Sounds: Clear, Diminished   Yes  Pt was extubated per MD order and placed on 3 L [place]. Cuff leak was noted prior to extubation and no stridor post. Pt is stable at this time. RT will monitor.   Merlene Laughter 05/22/2023, 3:30 PM

## 2023-05-22 NOTE — Progress Notes (Signed)
NAME:  Richard Cox, MRN:  [id number], DOB:  1981-04-07, LOS: 1 ADMISSION DATE:  05/21/2023, CONSULTATION DATE:  7/12 REFERRING MD:  [facility] EDP, CHIEF COMPLAINT:  respiratory failure    History of Present Illness:  42yo male with hx CKD, HIV, polysubstance abuse, HTN, CHF presented to [facility] ER 7/12 with SOB after being out of his lasix for 2 days. BNP>4500, lactate 6.4. Refused bipap and in ER and had progressive respiratory distress requiring intubation. Started on broad spectrum abx, diuresis. Given possible need for heart failure team and/or ID, pt tx to [facility] and PCCM asked to admit.   Pertinent  Medical History   Past Medical History:  Diagnosis Date   Acute respiratory failure with hypoxia (HCC) 08/24/2021   Chronic HFrEF (heart failure with reduced ejection fraction) (HCC)    CKD (chronic kidney disease) stage 2, GFR 60-89 ml/min    Endotracheally intubated 08/24/2021   Essential hypertension    HIV infection (HCC)    Lymphoma (HCC) 08/28/2020   Noncompliance with medication regimen    Polysubstance abuse (HCC)    Secondary cardiomyopathy (HCC)     Significant Hospital Events: Including procedures, antibiotic start and stop dates in addition to other pertinent events   CTA chest 7/12>>> 1. No evidence of acute pulmonary embolism. 2. Dense airspace consolidation with air bronchograms occupying much of the left lower lobe compatible with pneumonia. 3. Generalized ground-glass opacity throughout both lungs with interlobular septal thickening. Scattered areas of more focal airspace opacity, most pronounced within the upper lobes. Findings are favored to represent a combination of multifocal pneumonia with pulmonary edema. 4. Small right and trace left pleural effusions. 5. Cardiomegaly with severe left ventricular hypertrophy. 6. Dilated pulmonary trunk, which can be seen in the setting of pulmonary arterial hypertension.  Interim History / Subjective:  Patient is stable No  overnight events -Tolerating ventilator well Easily arousable  Objective   Blood pressure 113/78, pulse 74, temperature 98.3 F (36.8 C), temperature source Axillary, resp. rate (!) 24, height 6\' 1"  (1.854 m), weight 70 kg, SpO2 99%.    Vent Mode: PRVC FiO2 (%):  [40 %-100 %] 40 % Set Rate:  [20 bmp-24 bmp] 24 bmp Vt Set:  [60 mL-630 mL] 630 mL PEEP:  [5 cmH20-10 cmH20] 5 cmH20 Plateau Pressure:  [18 cmH20-20 cmH20] 20 cmH20   Intake/Output Summary (Last 24 hours) at 05/22/2023 0730 Last data filed at 05/22/2023 0600 Gross per 24 hour  Intake 1122.74 ml  Output 800 ml  Net 322.74 ml   Filed Weights   05/21/23 1345 05/22/23 0500  Weight: 72 kg 70 kg    Examination: General: Middle-age gentleman, does not appear to be in distress, chronically ill-appearing HENT: Moist oral mucosa with endotracheal tube in place Lungs: Rhonchi bilaterally Cardiovascular: S1-S2 appreciated Abdomen: Soft, bowel sounds appreciated Extremities: Skin is warm and dry Neuro: Easily arousable, following commands  Resolved Hospital Problem list     Assessment & Plan:   Acute respiratory failure Pulmonary edema  Decompensated heart failure with medication noncompliance Concern for pneumonia -Continue mechanical ventilation  -Target TVol 6-8cc/kgIBW -Target Plateau Pressure < 30cm H20 -Target driving pressure less than 15 cm of water -Target PaO2 55-65: titrate PEEP/FiO2 per protocol -Ventilator associated pneumonia prevention protocol  On Unasyn  HIV infection -CD4 greater than 500 -Appreciate infectious disease assistance with care  Hypertensive urgency Context of cocaine abuse -Continue to monitor -To resume home losartan  Systolic heart failure Pulmonary edema Cardiomyopathy Ejection fraction 25-30% global  hypokinesis, diastolic dysfunction -Cautious diuresis-on Lasix 40 twice a day  Polysubstance abuse -Continue to monitor  Chronic kidney disease -Avoid nephrotoxic  medications -Trend electrolytes  Agitation management invented patient's -Continue fentanyl, propofol  Will attempt to wean today to see what is able to do -Weaning so far has not been successful, not taking adequate breaths  I reviewed nursing notes, Consultant notes, last 24 h vitals , last 48 h intake and output, last 24 h labs and trends, and last 24 h imaging results.  Best Practice (right click and "Reselect all SmartList Selections" daily)   Diet/type: NPO DVT prophylaxis: LMWH GI prophylaxis: PPI Lines: N/A Foley:  Yes, and it is still needed Code Status:  full code Last date of multidisciplinary goals of care discussion [7/12]  Labs   CBC: Recent Labs  Lab 05/21/23 0948 05/22/23 0635  WBC  --  4.8  HGB 15.6 10.9*  HCT 46.0 33.2*  MCV  --  91.5  PLT  --  123*    Basic Metabolic Panel: Recent Labs  Lab 05/19/23 0221 05/21/23 0948 05/21/23 1003 05/22/23 0348  NA 142 144 138 142  K 3.6 4.1 4.0 3.5  CL 106 109 105 109  CO2 24  --  19* 25  GLUCOSE 97 173* 178* 82  BUN 19 17 16  21*  CREATININE 1.34* 1.30* 1.33* 1.63*  CALCIUM 9.0  --  8.8* 8.0*  MG  --   --   --  2.1  PHOS  --   --   --  3.5   GFR: Estimated Creatinine Clearance: 59.6 mL/min (A) (by C-G formula based on SCr of 1.63 mg/dL (H)). Recent Labs  Lab 05/21/23 1003 05/21/23 1129 05/21/23 1350 05/22/23 0635  WBC  --   --   --  4.8  LATICACIDVEN 6.4* 1.8 1.0  --     Liver Function Tests: Recent Labs  Lab 05/19/23 0221  AST 17  ALT 8*  BILITOT 0.3  PROT 7.1   No results for input(s): "LIPASE", "AMYLASE" in the last 168 hours. No results for input(s): "AMMONIA" in the last 168 hours.  ABG    Component Value Date/Time   PHART 7.21 (L) 05/21/2023 1146   PCO2ART 62 (H) 05/21/2023 1146   PO2ART 310 (H) 05/21/2023 1146   HCO3 25.2 05/21/2023 1146   TCO2 24 05/21/2023 0948   ACIDBASEDEF 3.9 (H) 05/21/2023 1146   O2SAT 100 05/21/2023 1146     Coagulation Profile: No results for  input(s): "INR", "PROTIME" in the last 168 hours.  Cardiac Enzymes: No results for input(s): "CKTOTAL", "CKMB", "CKMBINDEX", "TROPONINI" in the last 168 hours.  HbA1C: Hgb A1c MFr Bld  Date/Time Value Ref Range Status  08/25/2021 03:36 AM 5.1 4.8 - 5.6 % Final    Comment:    (NOTE) Pre diabetes:          5.7%-6.4%  Diabetes:              >6.4%  Glycemic control for   <7.0% adults with diabetes   09/18/2019 12:36 AM 5.5 4.8 - 5.6 % Final    Comment:    (NOTE) Pre diabetes:          5.7%-6.4% Diabetes:              >6.4% Glycemic control for   <7.0% adults with diabetes     CBG: Recent Labs  Lab 05/21/23 0947 05/21/23 1336 05/21/23 1415  GLUCAP 208* 61* 88    Review of  Systems:   Unable, pt sedated on vent. HPI obtained from records and staff.   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   Surgical History:   Past Surgical History:  Procedure Laterality Date   IR IMAGING GUIDED PORT INSERTION  08/23/2020   Right   NO PAST SURGERIES       Social History:   reports that he has quit smoking. His smoking use included cigarettes. He has never used smokeless tobacco. He reports that he does not currently use alcohol. He reports current drug use. Drugs: Marijuana and Cocaine.   Family History:  His family history includes Cancer in his father and mother; Congestive Heart Failure in his brother; Diabetes Mellitus II in his mother; Heart Problems in his brother; Lupus in his brother.   Allergies Allergies  Allergen Reactions   Motrin [Ibuprofen] Hypertension    Neosporin [Neomycin-Bacitracin Zn-Polymyx] Rash     The patient is critically ill with multiple organ systems failure and requires high complexity decision making for assessment and support, frequent evaluation and titration of therapies, application of advanced monitoring technologies and extensive interpretation of multiple databases. Critical Care Time devoted to patient care services described in this note independent of APP/resident time (if applicable)  is 32 minutes.   Virl Diamond MD [place] [facility] Personal pager: See Amion If unanswered, please page CCM On-call: #[phone]

## 2023-05-23 DIAGNOSIS — R0902 Hypoxemia: Secondary | ICD-10-CM | POA: Diagnosis not present

## 2023-05-23 LAB — CBC
HCT: 33.9 % — ABNORMAL LOW (ref 39.0–52.0)
Hemoglobin: 11.1 g/dL — ABNORMAL LOW (ref 13.0–17.0)
MCH: 30.3 pg (ref 26.0–34.0)
MCHC: 32.7 g/dL (ref 30.0–36.0)
MCV: 92.6 fL (ref 80.0–100.0)
Platelets: 123 10*3/uL — ABNORMAL LOW (ref 150–400)
RBC: 3.66 MIL/uL — ABNORMAL LOW (ref 4.22–5.81)
RDW: 17.7 % — ABNORMAL HIGH (ref 11.5–15.5)
WBC: 5 10*3/uL (ref 4.0–10.5)
nRBC: 0 % (ref 0.0–0.2)

## 2023-05-23 LAB — BASIC METABOLIC PANEL
Anion gap: 9 (ref 5–15)
BUN: 18 mg/dL (ref 6–20)
CO2: 27 mmol/L (ref 22–32)
Calcium: 8.5 mg/dL — ABNORMAL LOW (ref 8.9–10.3)
Chloride: 105 mmol/L (ref 98–111)
Creatinine, Ser: 1.29 mg/dL — ABNORMAL HIGH (ref 0.61–1.24)
GFR, Estimated: >60 mL/min (ref >60)
Glucose, Bld: 105 mg/dL — ABNORMAL HIGH (ref 70–99)
Potassium: 3.3 mmol/L — ABNORMAL LOW (ref 3.5–5.1)
Sodium: 141 mmol/L (ref 135–145)

## 2023-05-23 LAB — MAGNESIUM: Magnesium: 2.2 mg/dL (ref 1.7–2.4)

## 2023-05-23 LAB — GLUCOSE, CAPILLARY: Glucose-Capillary: 110 mg/dL — ABNORMAL HIGH (ref 70–99)

## 2023-05-23 MED ORDER — SODIUM CHLORIDE 0.9% FLUSH: 10.0000 mL | INTRAVENOUS | Status: DC | PRN

## 2023-05-23 MED ORDER — POTASSIUM CHLORIDE CRYS ER 20 MEQ PO TBCR
20.0000 meq | EXTENDED_RELEASE_TABLET | ORAL | Status: AC
  Administered 2023-05-23 (×2): 20 meq via ORAL
  Filled 2023-05-23 (×2): qty 1

## 2023-05-23 MED ORDER — SODIUM CHLORIDE 0.9% FLUSH
10.0000 mL | Freq: Two times a day (BID) | INTRAVENOUS | Status: DC
  Administered 2023-05-23 – 2023-05-24 (×3): 10 mL

## 2023-05-23 MED ORDER — BICTEGRAVIR-EMTRICITAB-TENOFOV 50-200-25 MG PO TABS
1.0000 | ORAL_TABLET | Freq: Every day | ORAL | Status: DC
  Administered 2023-05-23 – 2023-05-24 (×2): 1 via ORAL
  Filled 2023-05-23 (×2): qty 1

## 2023-05-23 MED ORDER — CARVEDILOL 25 MG PO TABS
25.0000 mg | ORAL_TABLET | Freq: Two times a day (BID) | ORAL | Status: DC
  Administered 2023-05-23 – 2023-05-24 (×2): 25 mg via ORAL
  Filled 2023-05-23 (×2): qty 1

## 2023-05-23 MED ORDER — AMOXICILLIN-POT CLAVULANATE 875-125 MG PO TABS
1.0000 | ORAL_TABLET | Freq: Two times a day (BID) | ORAL | Status: DC
  Administered 2023-05-23 – 2023-05-24 (×3): 1 via ORAL
  Filled 2023-05-23 (×5): qty 1

## 2023-05-23 MED ORDER — POTASSIUM CHLORIDE 10 MEQ/100ML IV SOLN
10.0000 meq | INTRAVENOUS | Status: AC
  Administered 2023-05-23 (×4): 10 meq via INTRAVENOUS
  Filled 2023-05-23 (×3): qty 100

## 2023-05-23 NOTE — Progress Notes (Signed)
[facility] ADULT ICU REPLACEMENT PROTOCOL   The patient does apply for the [facility] Adult ICU Electrolyte Replacment Protocol based on the criteria listed below:   1.Exclusion criteria: TCTS, ECMO, Dialysis, and Myasthenia Gravis patients 2. Is GFR >/= 30 ml/min? Yes.    Patient's GFR today is >60 3. Is SCr </= 2? Yes.   Patient's SCr is 1.29 mg/dL 4. Did SCr increase >/= 0.5 in 24 hours? No. 5.Pt's weight >40kg  Yes.   6. Abnormal electrolyte(s): K  7. Electrolytes replaced per protocol 8.  Call MD STAT for K+ </= 2.5, Phos </= 1, or Mag </= 1 Physician:  Azucena Freed [facility] 05/23/2023 4:53 AM

## 2023-05-23 NOTE — Progress Notes (Signed)
NAME:  Richard Cox, MRN:  [id number], DOB:  [birth year], LOS: 2 ADMISSION DATE:  05/21/2023, CONSULTATION DATE:  7/12 REFERRING MD:  [facility] EDP, CHIEF COMPLAINT:  respiratory failure    History of Present Illness:  42yo male with hx CKD, HIV, polysubstance abuse, HTN, CHF presented to [facility] ER 7/12 with SOB after being out of his lasix for 2 days. BNP>4500, lactate 6.4. Refused bipap and in ER and had progressive respiratory distress requiring intubation. Started on broad spectrum abx, diuresis. Given possible need for heart failure team and/or ID, pt tx to [facility] and PCCM asked to admit.   Pertinent  Medical History   Past Medical History:  Diagnosis Date   Acute respiratory failure with hypoxia (HCC) 08/24/2021   Chronic HFrEF (heart failure with reduced ejection fraction) (HCC)    CKD (chronic kidney disease) stage 2, GFR 60-89 ml/min    Endotracheally intubated 08/24/2021   Essential hypertension    HIV infection (HCC)    Lymphoma (HCC) 08/28/2020   Noncompliance with medication regimen    Polysubstance abuse (HCC)    Secondary cardiomyopathy (HCC)     Significant Hospital Events: Including procedures, antibiotic start and stop dates in addition to other pertinent events   CTA chest 7/12>>> 1. No evidence of acute pulmonary embolism. 2. Dense airspace consolidation with air bronchograms occupying much of the left lower lobe compatible with pneumonia. 3. Generalized ground-glass opacity throughout both lungs with interlobular septal thickening. Scattered areas of more focal airspace opacity, most pronounced within the upper lobes. Findings are favored to represent a combination of multifocal pneumonia with pulmonary edema. 4. Small right and trace left pleural effusions. 5. Cardiomegaly with severe left ventricular hypertrophy. 6. Dilated pulmonary trunk, which can be seen in the setting of pulmonary arterial hypertension.  Interim History / Subjective:  Was able to extubate  yesterday 713 Awake alert interactive Blood pressure still up a little bit  Objective   Blood pressure (!) 159/96, pulse 75, temperature 98.4 F (36.9 C), temperature source Oral, resp. rate 12, height 6\' 1"  (1.854 m), weight 73 kg, SpO2 96%.    Vent Mode: PRVC FiO2 (%):  [40 %] 40 % Set Rate:  [24 bmp] 24 bmp Vt Set:  [630 mL] 630 mL PEEP:  [5 cmH20] 5 cmH20 Pressure Support:  [8 cmH20] 8 cmH20 Plateau Pressure:  [15 cmH20] 15 cmH20   Intake/Output Summary (Last 24 hours) at 05/23/2023 4098 Last data filed at 05/23/2023 0700 Gross per 24 hour  Intake 698.93 ml  Output 3760 ml  Net -3061.07 ml   Filed Weights   05/21/23 1345 05/22/23 0500 05/23/23 0500  Weight: 72 kg 70 kg 73 kg    Examination: General: Young gentleman does not appear to be in distress HENT: Moist oral mucosa Lungs: He does have bilateral rhonchi Cardiovascular: S1-S2 appreciated Abdomen: Soft, bowel sounds appreciated Extremities: Skin is warm and dry Neuro: Awake alert oriented x 3  Resolved Hospital Problem list     Assessment & Plan:   Acute respiratory failure Pulmonary edema Decompensated heart failure with medication noncompliance Concern for pneumonia -Continue oxygen supplementation and wean as tolerated -Currently on Unasyn -Will transition to oral antibiotics  HIV infection with CD4 greater than 500 -Appreciate infectious disease assistance with care -Will resume his Biktarvy as he is taking orally at present  Hypertensive urgency in the context of cocaine abuse -Continue to monitor -Will resume his home losartan -Resume his Coreg  Systolic heart failure Pulmonary edema Cardiomyopathy Ejection  fraction of 25 to 35%, global hypokinesis, diastolic dysfunction -Cautious diuresis  Polysubstance abuse -Continue to monitor  Chronic kidney disease -Avoid nephrotoxic medications -Trend electrolytes  I reviewed nursing notes, Consultant notes, last 24 h vitals , last 48 h intake  and output, last 24 h labs and trends, and last 24 h imaging results.  Will transfer to floor  Best Practice (right click and "Reselect all SmartList Selections" daily)   Diet/type: Regular consistency (see orders) DVT prophylaxis: LMWH GI prophylaxis: PPI Lines: N/A Foley:  N/A Code Status:  full code Last date of multidisciplinary goals of care discussion [7/12]  Labs   CBC: Recent Labs  Lab 05/21/23 0948 05/22/23 0635 05/23/23 0318  WBC  --  4.8 5.0  HGB 15.6 10.9* 11.1*  HCT 46.0 33.2* 33.9*  MCV  --  91.5 92.6  PLT  --  123* 123*    Basic Metabolic Panel: Recent Labs  Lab 05/19/23 0221 05/21/23 0948 05/21/23 1003 05/22/23 0348 05/22/23 1007 05/22/23 1522 05/23/23 0318  NA 142 144 138 142 144  --  141  K 3.6 4.1 4.0 3.5 4.0  --  3.3*  CL 106 109 105 109 106  --  105  CO2 24  --  19* 25 26  --  27  GLUCOSE 97 173* 178* 82 85  --  105*  BUN 19 17 16  21* 20  --  18  CREATININE 1.34* 1.30* 1.33* 1.63* 1.68*  --  1.29*  CALCIUM 9.0  --  8.8* 8.0* 8.5*  --  8.5*  MG  --   --   --  2.1  --  2.2 2.2  PHOS  --   --   --  3.5  --  3.8  --    GFR: Estimated Creatinine Clearance: 78.6 mL/min (A) (by C-G formula based on SCr of 1.29 mg/dL (H)). Recent Labs  Lab 05/21/23 1003 05/21/23 1129 05/21/23 1350 05/22/23 0635 05/23/23 0318  WBC  --   --   --  4.8 5.0  LATICACIDVEN 6.4* 1.8 1.0  --   --     Liver Function Tests: Recent Labs  Lab 05/19/23 0221  AST 17  ALT 8*  BILITOT 0.3  PROT 7.1   No results for input(s): "LIPASE", "AMYLASE" in the last 168 hours. No results for input(s): "AMMONIA" in the last 168 hours.  ABG    Component Value Date/Time   PHART 7.21 (L) 05/21/2023 1146   PCO2ART 62 (H) 05/21/2023 1146   PO2ART 310 (H) 05/21/2023 1146   HCO3 25.2 05/21/2023 1146   TCO2 24 05/21/2023 0948   ACIDBASEDEF 3.9 (H) 05/21/2023 1146   O2SAT 100 05/21/2023 1146     Coagulation Profile: No results for input(s): "INR", "PROTIME" in the last  168 hours.  Cardiac Enzymes: No results for input(s): "CKTOTAL", "CKMB", "CKMBINDEX", "TROPONINI" in the last 168 hours.  HbA1C: Hgb A1c MFr Bld  Date/Time Value Ref Range Status  08/25/2021 03:36 AM 5.1 4.8 - 5.6 % Final    Comment:    (NOTE) Pre diabetes:          5.7%-6.4%  Diabetes:              >6.4%  Glycemic control for   <7.0% adults with diabetes   09/18/2019 12:36 AM 5.5 4.8 - 5.6 % Final    Comment:    (NOTE) Pre diabetes:          5.7%-6.4% Diabetes:              >  6.4% Glycemic control for   <7.0% adults with diabetes     CBG: Recent Labs  Lab 05/21/23 1336 05/21/23 1415 05/22/23 1929 05/22/23 2320 05/23/23 0335  GLUCAP 61* 88 105* 100* 110*    Review of Systems:   Unable, pt sedated on vent. HPI obtained from records and staff.   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   Surgical History:   Past Surgical History:  Procedure Laterality Date   IR IMAGING GUIDED PORT INSERTION  08/23/2020   Right   NO PAST SURGERIES       Social History:   reports that he has quit smoking. His smoking use included cigarettes. He has never used smokeless tobacco. He reports that he does not currently use alcohol. He reports current drug use. Drugs: Marijuana and Cocaine.   Family History:  His family history includes Cancer in his father and mother; Congestive Heart Failure in his brother; Diabetes Mellitus II in his mother; Heart Problems in his brother; Lupus in his brother.   Allergies Allergies  Allergen Reactions   Motrin [Ibuprofen] Hypertension    Neosporin [Neomycin-Bacitracin Zn-Polymyx] Rash    The patient is critically ill with multiple organ systems failure and requires high complexity decision making for assessment and support, frequent evaluation and titration of therapies, application of advanced monitoring technologies and extensive interpretation of multiple databases. Critical Care Time devoted to patient care services described in this note independent of APP/resident time (if applicable)  is 30 minutes.   Virl Diamond MD [facility] Personal pager: See Amion If unanswered, please page CCM On-call: #[phone]

## 2023-05-23 NOTE — Progress Notes (Signed)
Requested to assess PAC leaking.  Noted PAC tubing to be leaking in the hard plastic portion adjacent to the cap. Do not suspect tampering of device.  PAC deaccessed and reaccessed per flowsheet. No signs of infection noted. Luisa Hart RN and Jacquenette Shone RN aware.

## 2023-05-24 ENCOUNTER — Other Ambulatory Visit (HOSPITAL_COMMUNITY): Payer: Self-pay

## 2023-05-24 ENCOUNTER — Telehealth (HOSPITAL_COMMUNITY): Payer: Self-pay | Admitting: Pharmacy Technician

## 2023-05-24 DIAGNOSIS — R0902 Hypoxemia: Secondary | ICD-10-CM | POA: Diagnosis not present

## 2023-05-24 DIAGNOSIS — I509 Heart failure, unspecified: Secondary | ICD-10-CM

## 2023-05-24 DIAGNOSIS — I5043 Acute on chronic combined systolic (congestive) and diastolic (congestive) heart failure: Secondary | ICD-10-CM

## 2023-05-24 LAB — CULTURE, RESPIRATORY W GRAM STAIN: Culture: NORMAL

## 2023-05-24 LAB — BASIC METABOLIC PANEL
Anion gap: 7 (ref 5–15)
BUN: 28 mg/dL — ABNORMAL HIGH (ref 6–20)
CO2: 28 mmol/L (ref 22–32)
Calcium: 8.7 mg/dL — ABNORMAL LOW (ref 8.9–10.3)
Chloride: 105 mmol/L (ref 98–111)
Creatinine, Ser: 1.64 mg/dL — ABNORMAL HIGH (ref 0.61–1.24)
GFR, Estimated: 54 mL/min — ABNORMAL LOW (ref >60)
Glucose, Bld: 95 mg/dL (ref 70–99)
Potassium: 3.6 mmol/L (ref 3.5–5.1)
Sodium: 140 mmol/L (ref 135–145)

## 2023-05-24 LAB — CULTURE, BLOOD (ROUTINE X 2): Special Requests: ADEQUATE

## 2023-05-24 MED ORDER — ISOSORBIDE MONONITRATE ER 30 MG PO TB24
30.0000 mg | ORAL_TABLET | Freq: Every day | ORAL | 0 refills | Status: DC
  Filled 2023-05-24: qty 30, 30d supply, fill #0

## 2023-05-24 MED ORDER — BIKTARVY 50-200-25 MG PO TABS
1.0000 | ORAL_TABLET | Freq: Every day | ORAL | 0 refills | Status: DC
Start: 2023-05-24 — End: 2023-06-28
  Filled 2023-05-24 (×2): qty 30, 30d supply, fill #0

## 2023-05-24 MED ORDER — FUROSEMIDE 40 MG PO TABS: 80.0000 mg | ORAL_TABLET | Freq: Every day | ORAL | Status: DC

## 2023-05-24 MED ORDER — AMOXICILLIN-POT CLAVULANATE 875-125 MG PO TABS
1.0000 | ORAL_TABLET | Freq: Two times a day (BID) | ORAL | 0 refills | Status: AC
  Filled 2023-05-24: qty 8, 4d supply, fill #0

## 2023-05-24 MED ORDER — ISOSORBIDE MONONITRATE ER 30 MG PO TB24
30.0000 mg | ORAL_TABLET | Freq: Every day | ORAL | Status: DC
  Administered 2023-05-24: 30 mg via ORAL
  Filled 2023-05-24: qty 1

## 2023-05-24 MED ORDER — ORAL CARE MOUTH RINSE: 15.0000 mL | OROMUCOSAL | Status: DC | PRN

## 2023-05-24 MED ORDER — SACUBITRIL-VALSARTAN 24-26 MG PO TABS: 1.0000 | ORAL_TABLET | Freq: Two times a day (BID) | ORAL | Status: DC

## 2023-05-24 MED ORDER — SPIRONOLACTONE 12.5 MG HALF TABLET
12.5000 mg | ORAL_TABLET | Freq: Every day | ORAL | Status: DC
  Administered 2023-05-24: 12.5 mg via ORAL
  Filled 2023-05-24: qty 1

## 2023-05-24 MED ORDER — CARVEDILOL 25 MG PO TABS
25.0000 mg | ORAL_TABLET | Freq: Two times a day (BID) | ORAL | 2 refills | Status: DC
  Filled 2023-05-24: qty 60, 30d supply, fill #0

## 2023-05-24 MED ORDER — FUROSEMIDE 80 MG PO TABS
80.0000 mg | ORAL_TABLET | Freq: Every day | ORAL | 2 refills | Status: AC
Start: 2023-05-24 — End: 2023-08-22
  Filled 2023-05-24: qty 30, 30d supply, fill #0

## 2023-05-24 MED ORDER — FAMOTIDINE 20 MG PO TABS
20.0000 mg | ORAL_TABLET | Freq: Every day | ORAL | 0 refills | Status: DC
  Filled 2023-05-24: qty 30, 30d supply, fill #0

## 2023-05-24 MED ORDER — SPIRONOLACTONE 25 MG PO TABS
12.5000 mg | ORAL_TABLET | Freq: Every day | ORAL | 0 refills | Status: DC
  Filled 2023-05-24: qty 15, 30d supply, fill #0

## 2023-05-24 MED ORDER — HEPARIN SOD (PORK) LOCK FLUSH 100 UNIT/ML IV SOLN
500.0000 [IU] | INTRAVENOUS | Status: AC | PRN
  Administered 2023-05-24: 500 [IU]

## 2023-05-24 MED ORDER — SACUBITRIL-VALSARTAN 24-26 MG PO TABS
1.0000 | ORAL_TABLET | Freq: Two times a day (BID) | ORAL | 0 refills | Status: AC
Start: 2023-05-25 — End: 2023-06-24
  Filled 2023-05-24: qty 60, 30d supply, fill #0

## 2023-05-24 NOTE — Progress Notes (Signed)
Heart Failure Navigator Progress Note  Assessed for Heart & Vascular TOC clinic readiness.  Patient does not meet criteria due to Advanced Heart Failure Team consulted. .   Navigator will sign off at this time.   Dawn Fields, BSN, RN Heart Failure Nurse Navigator Secure Chat Only   

## 2023-05-24 NOTE — TOC Initial Note (Addendum)
Transition of Care [facility]) - Initial/Assessment Note    Patient Details  Name: Richard Cox MRN: [id number] Date of Birth: [birth year]  [facility] CM/SW Contact:    Elliot Cousin, RN Phone Number: [phone] 05/24/2023, 3:32 PM  Clinical Narrative:  HF TOC CM spoke to pt at bedside. States he was going to [employer]. Pt was dc from the practice. Provided pt with his copay card for Bictarvy to use a the pharmacy. Pt agreeable to contact [facility] Dept to establish with PCP. States his mother will provide transportation home. Pt states he is leaving if he is dc or not. Unit RN following up with attending. Meds to come up from [facility] pharmacy.  Has scale at home for daily weights.                Expected Discharge Plan: Home/Self Care Barriers to Discharge: No Barriers Identified   Patient Goals and CMS Choice Patient states their goals for this hospitalization and ongoing recovery are:: patient states he has personal affairs to attend to          Expected Discharge Plan and Services   Discharge Planning Services: CM Consult                                          Prior Living Arrangements/Services   Lives with:: Parents Patient language and need for interpreter reviewed:: Yes Do you feel safe going back to the place where you live?: Yes      Need for Family Participation in Patient Care: No (Comment) Care giver support system in place?: No (comment)   Criminal Activity/Legal Involvement Pertinent to Current Situation/Hospitalization: No - Comment as needed  Activities of Daily Living      Permission Sought/Granted Permission sought to share information with : Case Manager                Emotional Assessment Appearance:: Appears stated age Attitude/Demeanor/Rapport: Angry, Guarded Affect (typically observed): Anxious Orientation: : Oriented to Self, Oriented to Place, Oriented to  Time, Oriented to  Situation   Psych Involvement: No (comment)  Admission diagnosis:  AIDS (acquired immune deficiency syndrome) (HCC) [B20] Respiratory distress [R06.03] Hypoxia [R09.02] Pneumonia due to infectious organism, unspecified laterality, unspecified part of lung [J18.9] Systolic heart failure, unspecified HF chronicity (HCC) [I50.20] Acute respiratory failure (HCC) [J96.00] Patient Active Problem List   Diagnosis Date Noted   Hypoxia 05/21/2023   Acute respiratory failure (HCC) 05/21/2023   Poor social situation 05/19/2023   CHF (congestive heart failure) (HCC) 09/04/2022   Cocaine abuse (HCC) 09/04/2022   Hypertensive crisis 08/13/2022   Healthcare maintenance 10/17/2021   Acute respiratory failure with hypoxia (HCC) 08/24/2021   Elevated troponin 08/24/2021   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   Intra-abdominal lymphadenopathy 07/30/2020   Generalized lymphadenopathy 07/25/2020   Varicella zoster 01/04/2020   AIDS (acquired immune deficiency syndrome) (HCC) 10/10/2019   Acute on chronic combined systolic and diastolic CHF (congestive heart failure) (HCC) 09/18/2019   HIV (human immunodeficiency virus infection) (HCC) 09/18/2019   Substance abuse (HCC) 09/18/2019   Alcohol abuse 09/18/2019   Tobacco abuse 09/18/2019   HTN (hypertension) 03/10/2019   Noncompliance with medication regimen 03/10/2019   PCP:  Pcp, No Pharmacy:   [id number] - [place], [place] - [address] [place]   [address] Phone: [phone] Fax: [phone]  [id number] [place], [place] - [address] AT [facility] OF [address] [place] [address] Phone: [phone] Fax: [phone]  [facility] [address] [place] [address] Phone: [phone] Fax: [phone]     Social Determinants of Health (SDOH) Social History: SDOH Screenings   Food Insecurity: No  Food Insecurity (09/05/2022)  Housing: Low Risk  (02/07/2023)  Transportation Needs: No Transportation Needs (09/05/2022)  Utilities: Not At Risk (09/05/2022)  Alcohol Screen: Low Risk  (10/14/2020)  Depression (PHQ2-9): Low Risk  (05/19/2023)  Financial Resource Strain: Low Risk  (09/10/2020)  Physical Activity: Insufficiently Active (09/10/2020)  Social Connections: Socially Isolated (09/10/2020)  Stress: No Stress Concern Present (09/10/2020)  Tobacco Use: Medium Risk (05/21/2023)   SDOH Interventions:     Readmission Risk Interventions    08/31/2022    8:43 AM 08/27/2021    3:31 PM  Readmission Risk Prevention Plan  Transportation Screening Complete Complete  Medication Review (RN Care Manager) Complete   PCP or Specialist appointment within 3-5 days of discharge  Not Complete  PCP/Specialist Appt Not Complete comments  Left message with Dr Allena Katz, waiting for call back.  HRI or Home Care Consult Complete Complete  SW Recovery Care/Counseling Consult Complete Complete  Palliative Care Screening Not Applicable Not Applicable  Skilled Nursing Facility Not Applicable Not Applicable

## 2023-05-24 NOTE — Telephone Encounter (Signed)
Patient Advocate Encounter  Completed and sent PAF Copay relief application for Biktarvy for this patient who is on Medicair.    Patient is approved 05/24/2023 through 05/23/2024.  BIN      [id number] PCN    [id number] GRP    [id number] ID        [id number]     Roland Earl, CPhT Pharmacy Patient Advocate Specialist [facility] Patient Advocate Team Direct Number: [phone] Fax: [phone]

## 2023-05-24 NOTE — Progress Notes (Signed)
Pt leaving AMA - AMA paper signed.  MD notified of pt leaving AMA.  Medications from [facility] delivered to pt prior to leaving per order from MD.

## 2023-05-24 NOTE — Progress Notes (Signed)
[facility]    Date of Admission:  05/21/2023      ID: Richard Cox is a 42 y.o. male with hiv disease on biktarvy, admitted for acute heart failure Principal Problem:   Hypoxia Active Problems:   Acute respiratory failure (HCC)    Subjective: Feeling better. Afebrile, no shortness of breath. No tenderness to port site  Medications:   amoxicillin-clavulanate  1 tablet Oral Q12H   aspirin  81 mg Oral Daily   bictegravir-emtricitabine-tenofovir AF  1 tablet Oral Daily   carvedilol  25 mg Oral BID WC   Chlorhexidine Gluconate Cloth  6 each Topical Daily   enoxaparin (LOVENOX) injection  40 mg Subcutaneous Daily   famotidine  20 mg Oral Daily   furosemide  40 mg Intravenous BID   hydrALAZINE  50 mg Oral Q8H   losartan  50 mg Oral Daily   mupirocin ointment  1 Application Nasal BID   polyethylene glycol  17 g Per Tube Daily   sodium chloride flush  10-40 mL Intracatheter Q12H    Objective: Vital signs in last 24 hours: Temp:  [98.2 F (36.8 C)-99.6 F (37.6 C)] 98.7 F (37.1 C) (07/15 0752) Pulse Rate:  [73-96] 78 (07/15 0752) Resp:  [14-22] 18 (07/15 0752) BP: (147-182)/(80-129) 170/104 (07/15 0752) SpO2:  [94 %-98 %] 95 % (07/15 0752) Weight:  [72.9 kg] 72.9 kg (07/15 0435)  Physical Exam  Constitutional: He is oriented to person, place, and time. He appears well-developed and well-nourished. No distress.  HENT:  Mouth/Throat: Oropharynx is clear and moist. No oropharyngeal exudate.  Cardiovascular: Normal rate, regular rhythm and normal heart sounds. Exam reveals no gallop and no friction rub.  No murmur heard.  Chest wall: port is c/d/i Pulmonary/Chest: Effort normal and breath sounds normal. No respiratory distress. He has no wheezes.  Abdominal: Soft. Bowel sounds are normal. He exhibits no distension. There is no tenderness.  Lymphadenopathy:  He has no cervical adenopathy.  Neurological: He is alert and oriented to person, place,  and time.  Skin: Skin is warm and dry. No rash noted. No erythema.  Psychiatric: He has a normal mood and affect. His behavior is normal.    Lab Results Recent Labs    05/22/23 0635 05/22/23 1007 05/23/23 0318 05/24/23 0106  WBC 4.8  --  5.0  --   HGB 10.9*  --  11.1*  --   HCT 33.2*  --  33.9*  --   NA  --    < > 141 140  K  --    < > 3.3* 3.6  CL  --    < > 105 105  CO2  --    < > 27 28  BUN  --    < > 18 28*  CREATININE  --    < > 1.29* 1.64*   < > = values in this interval not displayed.   Liver Panel No results for input(s): "PROT", "ALBUMIN", "AST", "ALT", "ALKPHOS", "BILITOT", "BILIDIR", "IBILI" in the last 72 hours. Sedimentation Rate No results for input(s): "ESRSEDRATE" in the last 72 hours. C-Reactive Protein No results for input(s): "CRP" in the last 72 hours.  Microbiology:  Studies/Results: No results found.   Assessment/Plan: HIV disease= will have pharmacy give refill in hand since patient had insurance issues with medicare part D.   Will get another ID clinic appt earlier than his current November mtg  Heart failure = defer to dr ghimire for  management. Also consider getting heart failure team to see if can get into cardiology for management as outpatient  Dispo = having pharmacy see if he meets indication for scholarship to minimize out of pocket cost  I have personally spent 30 minutes involved in face-to-face and non-face-to-face activities for this patient on the day of the visit. Professional time spent includes the following activities: Preparing to see the patient (review of tests), Obtaining and/or reviewing separately obtained history (admission/discharge record), Performing a medically appropriate examination and/or evaluation , Ordering medications/tests/procedures, referring and communicating with other health care professionals, Documenting clinical information in the EMR, Independently interpreting results (not separately reported),  Communicating results to the patient/family/caregiver, Counseling and educating the patient/family/caregiver and Care coordination (not separately reported).     [facility] for Infectious Diseases Pager: [phone]  05/24/2023, 11:52 AM

## 2023-05-24 NOTE — Plan of Care (Signed)
°  Problem: Coping: °Goal: Level of anxiety will decrease °Outcome: Progressing °  °

## 2023-05-24 NOTE — Plan of Care (Signed)

## 2023-05-24 NOTE — Progress Notes (Signed)
PROGRESS NOTE    Richard Cox  UJW:[id number] DOB: Dec 13, 1980 DOA: 05/21/2023 PCP: Pcp, No    Brief Narrative:  42 year old male with history of HIV disease, polysubstance abuse, hypertension, combined heart failure, previous noncompliance admitted to ICU on 7/12 due to acute respiratory failure, pulmonary edema and hypertensive emergency in the setting of missing dialysis at home.  He was intubated for about 24 hours then extubated.  Now on room air.  He had similar presentation in the past.  Patient has a history of noncompliance and cocaine use.  Ran out of Lasix 2 days ago.  Echocardiogram with ejection fraction 30 to 35%.  Diuresed in the intensive care unit, now on room air and transferred to medical floor.   Assessment & Plan:   Acute hypoxic respiratory failure, respiratory insufficiency secondary to pulmonary edema and decompensated heart failure with medication noncompliance.  Also concern for pneumonia. Hypertensive emergency.  Status post extubation.  Currently on room air.  Pulmonary symptoms improved.  Treated with Unasyn.  Now on oral antibiotics.  Will complete 7 days of therapy. Patient on losartan, hydralazine, carvedilol 25 mg twice daily.  Currently on Lasix 40 mg IV twice daily with good response.  Known ejection fraction 25 to 35%. Given significant depressed ejection fraction, recurrent admissions will consult heart failure team.  Continue diuresis today.  Blood pressures are adequately controlled.  AKI on CKD stage IIIa: Slightly bump in creatinine today.  Monitor.  Recheck tomorrow morning.  Polysubstance abuse, cocaine abuse: Direct impact on cardiovascular disease.  Extensively counseled.  Patient thinks it is not a problem.  HIV disease: Last CD4 count more than 500 as per record.  Seen by ID.  On Biktarvy.   DVT prophylaxis: enoxaparin (LOVENOX) injection 40 mg Start: 05/21/23 1345   Code Status: Full code Family Communication: None at the bedside Disposition  Plan: Status is: Inpatient Remains inpatient appropriate because: Significant heart failure, IV diuresis     Consultants:  Infectious disease Critical care Cardiology  Procedures:  None  Antimicrobials:  Unasyn 7/12-7/14 Augmentin 7/14--   Subjective: Patient was seen and examined.  Denies any complaints.  He is on room air.  Able to mobilize around.  Objective: Vitals:   05/24/23 0544 05/24/23 0638 05/24/23 0752 05/24/23 1115  BP: (!) 174/129 (!) 167/110 (!) 170/104 (!) 154/100  Pulse: 78  78 76  Resp:   18 18  Temp:   98.7 F (37.1 C) 98.7 F (37.1 C)  TempSrc:   Oral Oral  SpO2: 95%  95% 97%  Weight:      Height:       No intake or output data in the 24 hours ending 05/24/23 1300 Filed Weights   05/22/23 0500 05/23/23 0500 05/24/23 0435  Weight: 70 kg 73 kg 72.9 kg    Examination:  General exam: Appears calm and comfortable  Respiratory system: Clear to auscultation. Respiratory effort normal. Cardiovascular system: S1 & S2 heard, RRR. No JVD, murmurs, rubs, gallops or clicks. No pedal edema. Gastrointestinal system: Abdomen is nondistended, soft and nontender. No organomegaly or masses felt. Normal bowel sounds heard. Central nervous system: Alert and oriented. No focal neurological deficits.     Data Reviewed: I have personally reviewed following labs and imaging studies  CBC: Recent Labs  Lab 05/21/23 0948 05/22/23 0635 05/23/23 0318  WBC  --  4.8 5.0  HGB 15.6 10.9* 11.1*  HCT 46.0 33.2* 33.9*  MCV  --  91.5 92.6  PLT  --  123*  123*   Basic Metabolic Panel: Recent Labs  Lab 05/21/23 1003 05/22/23 0348 05/22/23 1007 05/22/23 1522 05/23/23 0318 05/24/23 0106  NA 138 142 144  --  141 140  K 4.0 3.5 4.0  --  3.3* 3.6  CL 105 109 106  --  105 105  CO2 19* 25 26  --  27 28  GLUCOSE 178* 82 85  --  105* 95  BUN 16 21* 20  --  18 28*  CREATININE 1.33* 1.63* 1.68*  --  1.29* 1.64*  CALCIUM 8.8* 8.0* 8.5*  --  8.5* 8.7*  MG  --  2.1  --   2.2 2.2  --   PHOS  --  3.5  --  3.8  --   --    GFR: Estimated Creatinine Clearance: 61.7 mL/min (A) (by C-G formula based on SCr of 1.64 mg/dL (H)). Liver Function Tests: Recent Labs  Lab 05/19/23 0221  AST 17  ALT 8*  BILITOT 0.3  PROT 7.1   No results for input(s): "LIPASE", "AMYLASE" in the last 168 hours. No results for input(s): "AMMONIA" in the last 168 hours. Coagulation Profile: No results for input(s): "INR", "PROTIME" in the last 168 hours. Cardiac Enzymes: No results for input(s): "CKTOTAL", "CKMB", "CKMBINDEX", "TROPONINI" in the last 168 hours. BNP (last 3 results) No results for input(s): "PROBNP" in the last 8760 hours. HbA1C: No results for input(s): "HGBA1C" in the last 72 hours. CBG: Recent Labs  Lab 05/21/23 1336 05/21/23 1415 05/22/23 1929 05/22/23 2320 05/23/23 0335  GLUCAP 61* 88 105* 100* 110*   Lipid Profile: Recent Labs    05/22/23 0348  TRIG 167*   Thyroid Function Tests: No results for input(s): "TSH", "T4TOTAL", "FREET4", "T3FREE", "THYROIDAB" in the last 72 hours. Anemia Panel: No results for input(s): "VITAMINB12", "FOLATE", "FERRITIN", "TIBC", "IRON", "RETICCTPCT" in the last 72 hours. Sepsis Labs: Recent Labs  Lab 05/21/23 1003 05/21/23 1129 05/21/23 1350  LATICACIDVEN 6.4* 1.8 1.0    Recent Results (from the past 240 hour(s))  Resp panel by RT-PCR (RSV, Flu A&B, Covid) Anterior Nasal Swab     Status: None   Collection Time: 05/21/23 11:15 AM   Specimen: Anterior Nasal Swab  Result Value Ref Range Status   SARS Coronavirus 2 by RT PCR NEGATIVE NEGATIVE Final    Comment: (NOTE) SARS-CoV-2 target nucleic acids are NOT DETECTED.  The SARS-CoV-2 RNA is generally detectable in upper respiratory specimens during the acute phase of infection. The lowest concentration of SARS-CoV-2 viral copies this assay can detect is 138 copies/mL. A negative result does not preclude SARS-Cov-2 infection and should not be used as the sole  basis for treatment or other patient management decisions. A negative result may occur with  improper specimen collection/handling, submission of specimen other than nasopharyngeal swab, presence of viral mutation(s) within the areas targeted by this assay, and inadequate number of viral copies(<138 copies/mL). A negative result must be combined with clinical observations, patient history, and epidemiological information. The expected result is Negative.  Fact Sheet for Patients:  BloggerCourse.com  Fact Sheet for Healthcare Providers:  SeriousBroker.it  This test is no t yet approved or cleared by the [place] FDA and  has been authorized for detection and/or diagnosis of SARS-CoV-2 by FDA under an Emergency Use Authorization (EUA). This EUA will remain  in effect (meaning this test can be used) for the duration of the COVID-19 declaration under Section 564(b)(1) of the Act, 21 U.S.C.section 360bbb-3(b)(1), unless the authorization is  terminated  or revoked sooner.       Influenza A by PCR NEGATIVE NEGATIVE Final   Influenza B by PCR NEGATIVE NEGATIVE Final    Comment: (NOTE) The Xpert Xpress SARS-CoV-2/FLU/RSV plus assay is intended as an aid in the diagnosis of influenza from Nasopharyngeal swab specimens and should not be used as a sole basis for treatment. Nasal washings and aspirates are unacceptable for Xpert Xpress SARS-CoV-2/FLU/RSV testing.  Fact Sheet for Patients: BloggerCourse.com  Fact Sheet for Healthcare Providers: SeriousBroker.it  This test is not yet approved or cleared by the [place] FDA and has been authorized for detection and/or diagnosis of SARS-CoV-2 by FDA under an Emergency Use Authorization (EUA). This EUA will remain in effect (meaning this test can be used) for the duration of the COVID-19 declaration under Section 564(b)(1) of the Act,  21 U.S.C. section 360bbb-3(b)(1), unless the authorization is terminated or revoked.     Resp Syncytial Virus by PCR NEGATIVE NEGATIVE Final    Comment: (NOTE) Fact Sheet for Patients: BloggerCourse.com  Fact Sheet for Healthcare Providers: SeriousBroker.it  This test is not yet approved or cleared by the [place] FDA and has been authorized for detection and/or diagnosis of SARS-CoV-2 by FDA under an Emergency Use Authorization (EUA). This EUA will remain in effect (meaning this test can be used) for the duration of the COVID-19 declaration under Section 564(b)(1) of the Act, 21 U.S.C. section 360bbb-3(b)(1), unless the authorization is terminated or revoked.  Performed at [facility], [address] [place], [place] [address]   Blood culture (routine x 2)     Status: None (Preliminary result)   Collection Time: 05/21/23 11:29 AM   Specimen: BLOOD  Result Value Ref Range Status   Specimen Description BLOOD LEFT ANTECUBITAL  Final   Special Requests   Final    BOTTLES DRAWN AEROBIC AND ANAEROBIC Blood Culture adequate volume   Culture   Final    NO GROWTH 3 DAYS Performed at [facility], [address], [place], [place] [address]    Report Status PENDING  Incomplete  Blood culture (routine x 2)     Status: None (Preliminary result)   Collection Time: 05/21/23 11:29 AM   Specimen: BLOOD LEFT HAND  Result Value Ref Range Status   Specimen Description   Final    BLOOD LEFT HAND BOTTLES DRAWN AEROBIC AND ANAEROBIC   Special Requests Blood Culture adequate volume  Final   Culture   Final    NO GROWTH 3 DAYS Performed at [facility], [address] [place], [place] [address]    Report Status PENDING  Incomplete  Culture, Respiratory w Gram Stain     Status: None   Collection Time: 05/22/23  6:01 AM   Specimen: Tracheal Aspirate; Respiratory  Result Value Ref Range Status   Specimen Description TRACHEAL ASPIRATE   Final   Special Requests NONE  Final   Gram Stain   Final    FEW WBC PRESENT,BOTH PMN AND MONONUCLEAR FEW GRAM POSITIVE COCCI    Culture   Final    Normal respiratory flora-no Staph aureus or Pseudomonas seen Performed at [facility], [address] [place], [place] [address]    Report Status 05/24/2023 FINAL  Final  MRSA Next Gen by PCR, Nasal     Status: Abnormal   Collection Time: 05/22/23  9:42 AM   Specimen: Nasal Mucosa; Nasal Swab  Result Value Ref Range Status   MRSA by PCR Next Gen DETECTED (  A) NOT DETECTED Final    Comment: RESULT CALLED TO, READ BACK BY AND VERIFIED WITH: RN PATRICK ON [id number] @[id number]  BY SM (NOTE) The GeneXpert MRSA Assay (FDA approved for NASAL specimens only), is one component of a comprehensive MRSA colonization surveillance program. It is not intended to diagnose MRSA infection nor to guide or monitor treatment for MRSA infections. Test performance is not FDA approved in patients less than 18 years old. Performed at [facility], [address] [place], [place] [address]          Radiology Studies: No results found.      Scheduled Meds:  amoxicillin-clavulanate  1 tablet Oral Q12H   aspirin  81 mg Oral Daily   bictegravir-emtricitabine-tenofovir AF  1 tablet Oral Daily   carvedilol  25 mg Oral BID WC   Chlorhexidine Gluconate Cloth  6 each Topical Daily   enoxaparin (LOVENOX) injection  40 mg Subcutaneous Daily   famotidine  20 mg Oral Daily   furosemide  40 mg Intravenous BID   hydrALAZINE  50 mg Oral Q8H   losartan  50 mg Oral Daily   mupirocin ointment  1 Application Nasal BID   polyethylene glycol  17 g Per Tube Daily   sodium chloride flush  10-40 mL Intracatheter Q12H   Continuous Infusions:   LOS: 3 days    Time spent: 35 minutes    Dorcas Carrow, MD [facility]

## 2023-05-24 NOTE — Consult Note (Addendum)
Advanced Heart Failure Team Consult Note   Primary Physician: Pcp, No PCP-Cardiologist:  Nona Dell, MD  Reason for Consultation: Acute on chronic systolic heart failure  HPI:    Richard Cox is seen today for evaluation of acute on chronic systolic heart failure at the request of Dr. Jerral Ralph.   This is a 42 y/o male with PMH of HTN, combined systolic and diastolic heart failure, CKD 3a, HIV, and polysubstance use who presented on 05/21/2023 with acute respiratory failure, pulmonary edema, and hypertensive emergency. He was intubated and admitted to ICU. Diuresed to net negative 2.5 L then extubated 7/13 and transferred out of ICU on 7/14.   This patient has had previous admissions with similar presentations of flash pulmonary edema in the setting of medication nonadherence. Prior to admission he was in his normal state of health but ran out of furosemide for 2 days and developed progressive dyspnea. Otherwise no other changes or other preceding events or symptoms.   Cardiology saw in 2020 during hospitalization for chest pain found to have acute on chronic heart failure and uncontrolled severe hypertension. LVEF was 40-45%, with severely increased left ventricular wall thickness as well as diastolic dysfunction. LV showed diffuse HK. Cardiomyopathy possibly from hypertensive CM in light of reported noncompliance with medical therapy, but may have HCM. He had mild flat troponin elevation. He was sent home on GDMT. Plan was for outpatient MRI.    He was readmitted in 09/2019 and seen for chest pain, hypertensive urgency in the setting of stopping medications 2 months prior. CT showed moderate pericardial effusion. Repeat echo showed small to moderate pericardial effusion.    Repeat echo 11/2020 showed LVEF 55-60%.    He was seen again in 08/2021 during admission and was seen for acute hypoxic respiratory failure 2/2 flash pulmonary edema, HTN urgency, acute on chronic combined CHF,  hypertensive urgency, cocaine +, AKI. Echo showed LVEF 30-35%.  cMRI showed HCM without obstruction, 31mm massive septal hypertrophy in the mid left ventricular septum, LVEF 30-35%.    Admitted again in 08/2022 seen for acute on chronic combined systolic/diastolic HF, HTN, noncompliance, cocaine +, and demand ischemia. He ran out of lasix 2 days prior. Echo showed LVEF 30-35%, moderately decreased function, severe asymmetric LVH, G3DD, normal RVSF, severely dilated LA, moderately dilated RA. The patient was diuresed and discharged with lasix 80mg  daily, Hydralazine 100mg  TID, Coreg 25mg  BID, Losartan 50mg  daily.   Last admitted 01/2023 with similar presentation. Echo showed LVEF 25 to 30% with severely decreased LV function, global hypokinesis, asymmetric septal LVH with RV involvement, and grade 1 diastolic dysfunction. Diuresed and discharged on same medications.   Home Medications Prior to Admission medications   Medication Sig Start Date End Date Taking? Authorizing Provider  acetaminophen (TYLENOL) 500 MG tablet Take 1,000 mg by mouth every 6 (six) hours as needed for moderate pain or headache.   Yes [provider]  bictegravir-emtricitabine-tenofovir AF (BIKTARVY) 50-200-25 MG TABS tablet Take 1 tablet by mouth daily. 05/19/23  Yes Veryl Speak, FNP  carvedilol (COREG) 25 MG tablet Take 1 tablet (25 mg total) by mouth 2 (two) times daily with a meal. 02/08/23 05/23/23 Yes Shahmehdi, Seyed A, MD  hydrALAZINE (APRESOLINE) 100 MG tablet Take 0.5 tablets (50 mg total) by mouth 3 (three) times daily. 02/08/23 06/08/23 Yes Shahmehdi, Gemma Payor, MD  losartan (COZAAR) 50 MG tablet Take 1 tablet (50 mg total) by mouth daily. 02/08/23 05/23/23 Yes Shahmehdi, Gemma Payor, MD  aspirin 81  MG chewable tablet Chew 1 tablet (81 mg total) by mouth daily. Patient not taking: Reported on 05/19/2023 02/08/23   Kendell Bane, MD  furosemide (LASIX) 80 MG tablet Take 1 tablet (80 mg total) by mouth daily. Patient  not taking: Reported on 05/23/2023 05/19/23 08/17/23  Veryl Speak, FNP    Past Medical History: Past Medical History:  Diagnosis Date   Acute respiratory failure with hypoxia (HCC) 08/24/2021   Chronic HFrEF (heart failure with reduced ejection fraction) (HCC)    CKD (chronic kidney disease) stage 2, GFR 60-89 ml/min    Endotracheally intubated 08/24/2021   Essential hypertension    HIV infection (HCC)    Lymphoma (HCC) 08/28/2020   Noncompliance with medication regimen    Polysubstance abuse (HCC)    Secondary cardiomyopathy (HCC)     Past Surgical History: Past Surgical History:  Procedure Laterality Date   IR IMAGING GUIDED PORT INSERTION  08/23/2020   Right   NO PAST SURGERIES      Family History: Family History  Problem Relation Age of Onset   Diabetes Mellitus II Mother    Cancer Mother        multiple myeloma   Cancer Father        multiple myeloma   Congestive Heart Failure Brother    Heart Problems Brother    Lupus Brother     Social History: Social History   Socioeconomic History   Marital status: Single    Spouse name: Not on file   Number of children: 0   Years of education: 13   Highest education level: Not on file  Occupational History   Not on file  Tobacco Use   Smoking status: Former    Current packs/day: 0.25    Types: Cigarettes   Smokeless tobacco: Never  Vaping Use   Vaping status: Never Used  Substance and Sexual Activity   Alcohol use: Not Currently   Drug use: Yes    Types: Marijuana, Cocaine   Sexual activity: Not Currently    Comment: given condoms  Other Topics Concern   Not on file  Social History Narrative   Not on file   Social Determinants of Health   Financial Resource Strain: Low Risk  (09/10/2020)   Overall Financial Resource Strain (CARDIA)    Difficulty of Paying Living Expenses: Not very hard  Food Insecurity: No Food Insecurity (09/05/2022)   Hunger Vital Sign    Worried About Running Out of Food in the  Last Year: Never true    Ran Out of Food in the Last Year: Never true  Transportation Needs: No Transportation Needs (09/05/2022)   PRAPARE - Administrator, Civil Service (Medical): No    Lack of Transportation (Non-Medical): No  Physical Activity: Insufficiently Active (09/10/2020)   Exercise Vital Sign    Days of Exercise per Week: 5 days    Minutes of Exercise per Session: 20 min  Stress: No Stress Concern Present (09/10/2020)   [employer] of Occupational Health - Occupational Stress Questionnaire    Feeling of Stress : Only a little  Social Connections: Socially Isolated (09/10/2020)   Social Connection and Isolation Panel [NHANES]    Frequency of Communication with Friends and Family: More than three times a week    Frequency of Social Gatherings with Friends and Family: Never    Attends Religious Services: Never    Database administrator or Organizations: No    Attends Banker Meetings:  Not on file    Marital Status: Never married    Allergies:  Allergies  Allergen Reactions   Motrin [Ibuprofen] Hypertension   Neosporin [Neomycin-Bacitracin Zn-Polymyx] Rash    Objective:    Vital Signs:   Temp:  [98.2 F (36.8 C)-99.6 F (37.6 C)] 98.7 F (37.1 C) (07/15 0752) Pulse Rate:  [73-96] 78 (07/15 0752) Resp:  [14-22] 18 (07/15 0752) BP: (147-182)/(80-129) 170/104 (07/15 0752) SpO2:  [94 %-98 %] 95 % (07/15 0752) Weight:  [72.9 kg] 72.9 kg (07/15 0435) Last BM Date : 05/23/23  Weight change: Filed Weights   05/22/23 0500 05/23/23 0500 05/24/23 0435  Weight: 70 kg 73 kg 72.9 kg    Intake/Output:   Intake/Output Summary (Last 24 hours) at 05/24/2023 1105 Last data filed at 05/23/2023 1300 Gross per 24 hour  Intake 637.26 ml  Output --  Net 637.26 ml      Physical Exam    General:  Well appearing. No resp difficulty on room air HEENT: normal Neck: supple. No JVD. Cor:  Regular rate & rhythm. No rubs, gallops or murmurs. Lungs:  clear Abdomen: soft, nontender, nondistended. No hepatosplenomegaly.  Good bowel sounds. Extremities: warm and well perfused without edema  Neuro: alert & orientedx3, cranial nerves grossly intact. moves all 4 extremities w/o difficulty. Affect pleasant   EKG    From 05/21/2023-sinus tachycardia, LVH, left atrial enlargement, and ST changes in anterior leads.  Labs   Basic Metabolic Panel: Recent Labs  Lab 05/21/23 1003 05/22/23 0348 05/22/23 1007 05/22/23 1522 05/23/23 0318 05/24/23 0106  NA 138 142 144  --  141 140  K 4.0 3.5 4.0  --  3.3* 3.6  CL 105 109 106  --  105 105  CO2 19* 25 26  --  27 28  GLUCOSE 178* 82 85  --  105* 95  BUN 16 21* 20  --  18 28*  CREATININE 1.33* 1.63* 1.68*  --  1.29* 1.64*  CALCIUM 8.8* 8.0* 8.5*  --  8.5* 8.7*  MG  --  2.1  --  2.2 2.2  --   PHOS  --  3.5  --  3.8  --   --     Liver Function Tests: Recent Labs  Lab 05/19/23 0221  AST 17  ALT 8*  BILITOT 0.3  PROT 7.1   No results for input(s): "LIPASE", "AMYLASE" in the last 168 hours. No results for input(s): "AMMONIA" in the last 168 hours.  CBC: Recent Labs  Lab 05/21/23 0948 05/22/23 0635 05/23/23 0318  WBC  --  4.8 5.0  HGB 15.6 10.9* 11.1*  HCT 46.0 33.2* 33.9*  MCV  --  91.5 92.6  PLT  --  123* 123*    Cardiac Enzymes: No results for input(s): "CKTOTAL", "CKMB", "CKMBINDEX", "TROPONINI" in the last 168 hours.  BNP: BNP (last 3 results) Recent Labs    02/08/23 0319 02/08/23 0844 05/21/23 1003  BNP 2,249.0* 2,032.0* >4,500.0*    ProBNP (last 3 results) No results for input(s): "PROBNP" in the last 8760 hours.   CBG: Recent Labs  Lab 05/21/23 1336 05/21/23 1415 05/22/23 1929 05/22/23 2320 05/23/23 0335  GLUCAP 61* 88 105* 100* 110*    Coagulation Studies: No results for input(s): "LABPROT", "INR" in the last 72 hours.   Imaging   05/21/2023 CTA chest/PE protocol IMPRESSION: 1. No evidence of acute pulmonary embolism. 2. Dense airspace  consolidation with air bronchograms occupying much of the left lower lobe compatible with pneumonia.  3. Generalized ground-glass opacity throughout both lungs with interlobular septal thickening. Scattered areas of more focal airspace opacity, most pronounced within the upper lobes. Findings are favored to represent a combination of multifocal pneumonia with pulmonary edema. 4. Small right and trace left pleural effusions. 5. Cardiomegaly with severe left ventricular hypertrophy. 6. Dilated pulmonary trunk, which can be seen in the setting of pulmonary arterial hypertension.   Medications:     Current Medications:  amoxicillin-clavulanate  1 tablet Oral Q12H   aspirin  81 mg Oral Daily   bictegravir-emtricitabine-tenofovir AF  1 tablet Oral Daily   carvedilol  25 mg Oral BID WC   Chlorhexidine Gluconate Cloth  6 each Topical Daily   enoxaparin (LOVENOX) injection  40 mg Subcutaneous Daily   famotidine  20 mg Oral Daily   furosemide  40 mg Intravenous BID   hydrALAZINE  50 mg Oral Q8H   losartan  50 mg Oral Daily   mupirocin ointment  1 Application Nasal BID   polyethylene glycol  17 g Per Tube Daily   sodium chloride flush  10-40 mL Intracatheter Q12H    Patient Profile   41 y/o male with PMH of HTN, combined systolic and diastolic heart failure, CKD 3a, HIV, and polysubstance use who presented on 05/21/2023 with acute respiratory failure, pulmonary edema, and hypertensive emergency. He was intubated and admitted to ICU. Diuresed to net negative 2.5 L then extubated 7/13 and transferred out of ICU on 7/14.   Assessment/Plan   Acute on chronic combined systolic/diastolic heart failure Hypertrophic nonobstructive cardiomyopathy Hypertension - Exacerbation in the setting of running out of medication.  Responded well to IV diuresis.  Currently at baseline on room air. - Last echo 02/2023 with EF 25 to 30%, persistent severe asymmetric septal LVH and RVH, global hypokinesis of LV,  grade 1 diastolic dysfunction - No previous heart cath. Will discuss RHC while admitted.  -No PCP or cardiologist outpatient.  Will need both of these.  Home meds include carvedilol 25 mg twice daily, losartan 50 mg daily, and furosemide 80 mg daily - Cr 1.68>1.29>1.64, K 3.6 - d/c losartan and start entresto 24-26 mg bid - continue carvedilol 25 mg bid and start spironolactone 12.5 mg  - continue hydralazine  - plan to add SGLT2i next - would prefer to do heart cath here but pt might leave AMA. We will get him a follow up in clinic   Elevated troponin - [phone] - consistent with prior admissions, no chest pain - would prefer to do heart cath while admitted  - continue asa 81 mg daily  Remainder of problems per primary.    Medication concerns reviewed with patient and pharmacy team.  Length of Stay: 3  Richard Morel, DO  05/24/2023, 11:05 AM  Advanced Heart Failure Team Pager [phone] (M-F; 7a - 5p)  Please contact [facility] for night-coverage after hours (4p -7a ) and weekends on [email or web address]   Patient seen with resident, agree with the above note.    He was admitted with pulmonary edema/hypertensive emergency, was off his medications for at least 2 days prior to admission. He was initially intubated and diuresed, has been extubated.  Creatinine relatively stable at 1.64.  BP remains high.   General: NAD Neck: No JVD, no thyromegaly or thyroid nodule.  Lungs: Clear to auscultation bilaterally with normal respiratory effort. CV: Nondisplaced PMI.  Heart regular S1/S2, no S3/S4, no murmur.  No peripheral edema.  No carotid bruit.  Normal pedal pulses.  Abdomen:  Soft, nontender, no hepatosplenomegaly, no distention.  Skin: Intact without lesions or rashes.  Neurologic: Alert and oriented x 3.  Psych: Normal affect. Extremities: No clubbing or cyanosis.  HEENT: Normal.   I reviewed patient's prior imaging studies.  I suspect that he has hypertrophic nonobstructive  cardiomyopathy.  Cardiac MRI was suggestive with severe asymmetric septal hypertrophy and extensive LGE in the hypertrophied segments of the left ventricle. Last echo in 4/24 showed EF 25-30% with severe asymmetric septal hypertrophy, no SAM or LVOT gradient.  Patient is still hypertensive but looks euvolemic at this point.  - I recommended that he stay in the hospital to titrate GDMT and bring down BP.  I also recommended LHC/RHC tomorrow as he has never had evaluation for component of ischemic cardiomyopathy.  However, he insists on leaving the hospital today.  - Would restart his home Lasix 80 mg daily.  - Continue Coreg 25 mg bid.  - Stop losartan, start Entresto 24/26 bid.  - Add spironolactone 12.5 daily.  - Continue hydralazine 50 tid and add Imdur 30 daily.  - Will arrange for followup in CHF clinic and also will need BMET later this week if he leaves today.  - Ideally, he would get ICD with suspected HCM, low EF, and extensive LGE.  However, he has not been compliant with followup.   Richard Cox 05/24/2023 3:50 PM

## 2023-05-25 NOTE — Discharge Summary (Signed)
Physician Discharge Summary  Richard Cox WUX:[id number] DOB: [birth year] DOA: 05/21/2023  PCP: Pcp, No  Admit date: 05/21/2023 Discharge date: 05/25/2023  Admitted From: Home Disposition: Home, left AMA  Recommendations for Outpatient Follow-up:  Follow up with PCP in 1-2 weeks Please obtain BMP/CBC in one week Keep up the cardiology follow-up  Discharge Condition: Fair CODE STATUS: Full code Diet recommendation: Low-salt diet, no drugs, no cocaine.  Discharge summary: 42 year old male with history of HIV disease, polysubstance abuse, hypertension, combined heart failure, previous noncompliance admitted to ICU on 7/12 due to acute respiratory failure, pulmonary edema and hypertensive emergency in the setting of missing dialysis at home.  He was intubated for about 24 hours then extubated.  Now on room air.  He had similar presentation in the past.  Patient has a history of noncompliance and cocaine use.  Ran out of Lasix 2 days ago.  Echocardiogram with ejection fraction 30 to 35%.  Diuresed in the intensive care unit, now on room air and transferred to medical floor.  # Acute hypoxic respiratory failure, respiratory insufficiency secondary to pulmonary edema and decompensated heart failure with medication noncompliance.  Also concern for pneumonia. Hypertensive emergency.   Currently on room air.  Pulmonary symptoms improved.  Treated with Unasyn.  Now on oral antibiotics.  Patient on losartan, hydralazine, carvedilol 25 mg twice daily.  Patient was on Lasix IV.  He had a good diuretic response.  Known ejection fraction 25 to 35%. Patient was clinically improving after IV diuresis, he needed ischemic evaluation.  Heart failure team was consulted.  However he decided to leave AGAINST MEDICAL ADVICE not completing treatment after knowing all problems, potential consequences and chances of worsening symptoms. Even though he was living AGAINST MEDICAL ADVICE, he was prescribed appropriate GDMT  including Carvedilol, Entresto, spironolactone, Lasix.  Cardiology will schedule follow-up.   AKI on CKD stage IIIa: Slightly bump in creatinine .  He did not wait to recheck.  Polysubstance abuse, cocaine abuse: Direct impact on cardiovascular disease.  Extensively counseled.  Patient thinks it is not a problem.   HIV disease: Last CD4 count more than 500 as per record.  Seen by ID.  On Biktarvy.  Suspected pneumonia: Treated with IV antibiotics.  Currently on Augmentin.  Prescribed 4 additional days of medications.  Patient was required to stay in the hospital for cardiology/ischemic evaluation for new onset congestive heart failure.  He was fairly stable.  He wanted to leave.  Even though he left AGAINST MEDICAL ADVICE, he was given all appropriate medications including antibiotics from transitional care pharmacy.  He was advised to keep up his appointments.   Discharge Diagnoses:  Principal Problem:   Hypoxia Active Problems:   Acute respiratory failure [facility])    Discharge Instructions  Discharge Instructions     (HEART FAILURE PATIENTS) Call MD:  Anytime you have any of the following symptoms: 1) 3 pound weight gain in 24 hours or 5 pounds in 1 week 2) shortness of breath, with or without a dry hacking cough 3) swelling in the hands, feet or stomach 4) if you have to sleep on extra pillows at night in order to breathe.   Complete by: As directed    Diet - low sodium heart healthy   Complete by: As directed    Increase activity slowly   Complete by: As directed       Allergies as of 05/24/2023       Reactions   Motrin [ibuprofen] Hypertension   Neosporin [  neomycin-bacitracin Zn-polymyx] Rash        Medication List     STOP taking these medications    losartan 50 MG tablet Commonly known as: COZAAR       TAKE these medications    acetaminophen 500 MG tablet Commonly known as: TYLENOL Take 1,000 mg by mouth every 6 (six) hours as needed for moderate pain or  headache.   amoxicillin-clavulanate 875-125 MG tablet Commonly known as: AUGMENTIN Take 1 tablet by mouth every 12 (twelve) hours for 4 days.   aspirin 81 MG chewable tablet Chew 1 tablet (81 mg total) by mouth daily.   Biktarvy 50-200-25 MG Tabs tablet Generic drug: bictegravir-emtricitabine-tenofovir AF Take 1 tablet by mouth daily.   carvedilol 25 MG tablet Commonly known as: COREG Take 1 tablet (25 mg total) by mouth 2 (two) times daily with a meal.   Entresto 24-26 MG Generic drug: sacubitril-valsartan Take 1 tablet by mouth 2 (two) times daily.   famotidine 20 MG tablet Commonly known as: PEPCID Take 1 tablet (20 mg total) by mouth daily.   furosemide 80 MG tablet Commonly known as: LASIX Take 1 tablet (80 mg total) by mouth daily.   hydrALAZINE 100 MG tablet Commonly known as: APRESOLINE Take 0.5 tablets (50 mg total) by mouth 3 (three) times daily.   isosorbide mononitrate 30 MG 24 hr tablet Commonly known as: IMDUR Take 1 tablet (30 mg total) by mouth daily.   spironolactone 25 MG tablet Commonly known as: ALDACTONE Take 0.5 tablets (12.5 mg total) by mouth daily.        Follow-up Information     [facility] Follow up on 06/04/2023.   Specialty: Cardiology Why: Follow up in the Advanced Heart Failure Clinic 7/26 at 2:30pm.  Entrance C, free valet Contact information: [address] [id number] mc [place] [address] [phone]        [facility] Public Follow up.   Why: Please call and schedule appt to establish with Primary care physician Contact information: [address] [place] [address] [phone]                Allergies  Allergen Reactions   Motrin [Ibuprofen] Hypertension   Neosporin [Neomycin-Bacitracin Zn-Polymyx] Rash    Consultations: Cardiology Infectious disease Critical care   Procedures/Studies: CT Angio Chest PE W/Cm &/Or Wo  Cm  Result Date: 05/21/2023 CLINICAL DATA:  Pulmonary embolism (PE) suspected, high prob EXAM: CT ANGIOGRAPHY CHEST WITH CONTRAST TECHNIQUE: Multidetector CT imaging of the chest was performed using the standard protocol during bolus administration of intravenous contrast. Multiplanar CT image reconstructions and MIPs were obtained to evaluate the vascular anatomy. RADIATION DOSE REDUCTION: This exam was performed according to the departmental dose-optimization program which includes automated exposure control, adjustment of the mA and/or kV according to patient size and/or use of iterative reconstruction technique. CONTRAST:  75mL OMNIPAQUE IOHEXOL 350 MG/ML SOLN COMPARISON:  08/30/2022 FINDINGS: Cardiovascular: Satisfactory opacification of the pulmonary arteries. No filling defect identified to the segmental branch level. Distal pulmonary arterial branches are degraded by respiratory motion artifact. Pulmonary trunk is dilated at 3.9 cm in diameter. Thoracic aorta is nonaneurysmal. Cardiomegaly with severe left ventricular hypertrophy. Right-sided chest port in place terminating within the SVC. Mediastinum/Nodes: Endotracheal and enteric tubes are appropriately positioned. Several mildly prominent mediastinal lymph nodes are similar in appearance to prior. No axillary lymphadenopathy. Lungs/Pleura: Dense airspace consolidation with air bronchograms occupying much of the left lower lobe.  Generalized ground-glass opacity throughout both lungs with interlobular septal thickening. Scattered areas of airspace opacity, more pronounced within the upper lobes. Small right and trace left pleural effusions. No pneumothorax. Upper Abdomen: No acute abnormality. Musculoskeletal: No chest wall abnormality. No acute or significant osseous findings. Review of the MIP images confirms the above findings. IMPRESSION: 1. No evidence of acute pulmonary embolism. 2. Dense airspace consolidation with air bronchograms occupying much  of the left lower lobe compatible with pneumonia. 3. Generalized ground-glass opacity throughout both lungs with interlobular septal thickening. Scattered areas of more focal airspace opacity, most pronounced within the upper lobes. Findings are favored to represent a combination of multifocal pneumonia with pulmonary edema. 4. Small right and trace left pleural effusions. 5. Cardiomegaly with severe left ventricular hypertrophy. 6. Dilated pulmonary trunk, which can be seen in the setting of pulmonary arterial hypertension. Electronically Signed   By: Duanne Guess D.O.   On: 05/21/2023 12:52   DG Chest Port 1 View  Result Date: 05/21/2023 CLINICAL DATA:  [id number] Encounter for orogastric (OG) tube placement [id number] EXAM: PORTABLE CHEST 1 VIEW COMPARISON:  CXR 05/21/23 FINDINGS: Enteric tube courses below diaphragm with tip and side hole projecting over the expected location of the stomach. Right-sided central venous catheter terminates in the lower SVC. No pleural effusion. No pneumothorax. Cardiomegaly. Mild pulmonary edema. IMPRESSION: Enteric tube courses below diaphragm with tip and side hole projecting over the expected location of the stomach. Electronically Signed   By: Lorenza Cambridge M.D.   On: 05/21/2023 12:19   DG Abdomen 1 View  Result Date: 05/21/2023 CLINICAL DATA:  og tube EXAM: ABDOMEN - 1 VIEW COMPARISON:  CT scan abdomen and pelvis from 07/24/2020. FINDINGS: The bowel gas pattern is non-obstructive. No evidence of pneumoperitoneum, within the limitations of a supine film. No acute osseous abnormalities. There are heterogeneous opacities throughout visualized bilateral lungs, which is nonspecific. Please note, patient is scheduled for CT angiography of the chest. Surgical changes, devices, tubes and lines: Enteric tube is seen with its tip overlying the left upper quadrant, within the fundus of the stomach. However, the side hole remains just above the left hemidiaphragm. Please further  advance the enteric tube by 4-6 inches to confidently port the side hole into the stomach. IMPRESSION: 1. Enteric tube with its tip overlying the left upper quadrant, within the fundus of the stomach. However, the side hole remains just above the left hemidiaphragm. Please further advance the enteric tube by 4-6 inches to confidently put the side hole into the stomach. Electronically Signed   By: Jules Schick M.D.   On: 05/21/2023 11:19   DG Chest Port 1 View  Result Date: 05/21/2023 CLINICAL DATA:  Shortness of breath, post intubation and OG tube placement. EXAM: PORTABLE CHEST 1 VIEW COMPARISON:  Chest x-ray dated 02/07/2023 FINDINGS: Endotracheal tube appears adequately positioned with tip at the level of the clavicles and approximately 6 cm above the carina. Enteric tube passes to the lower mediastinum (incompletely imaged). RIGHT chest wall Port-A-Cath in place with tip adequately positioned at the level of the mid/upper SVC. Bilateral diffuse interstitial opacities, and scattered airspace opacities, are stable. No new lung findings. No pneumothorax is seen. Heart size and mediastinal contours appear stable. IMPRESSION: 1. Endotracheal tube appears adequately positioned, approximately 6 cm above the carina. 2. Enteric tube passes to the lower mediastinum (incompletely imaged). 3. Stable bilateral diffuse interstitial opacities and scattered airspace opacities, compatible with pulmonary edema and/or pneumonia. Electronically Signed   By: Weyman Croon  Linde Gillis M.D.   On: 05/21/2023 11:07   (Echo, Carotid, EGD, Colonoscopy, ERCP)    Subjective: Patient seen in the morning rounds.  Later in the evening, he decided to go home.  He was counseled to stay, however he told us "you discharge me or not, I have to go".   Discharge Exam: Vitals:   05/24/23 0752 05/24/23 1115  BP: (!) 170/104 (!) 154/100  Pulse: 78 76  Resp: 18 18  Temp: 98.7 F (37.1 C) 98.7 F (37.1 C)  SpO2: 95% 97%   Vitals:   05/24/23  0544 05/24/23 0638 05/24/23 0752 05/24/23 1115  BP: (!) 174/129 (!) 167/110 (!) 170/104 (!) 154/100  Pulse: 78  78 76  Resp:   18 18  Temp:   98.7 F (37.1 C) 98.7 F (37.1 C)  TempSrc:   Oral Oral  SpO2: 95%  95% 97%  Weight:      Height:          The results of significant diagnostics from this hospitalization (including imaging, microbiology, ancillary and laboratory) are listed below for reference.     Microbiology: Recent Results (from the past 240 hour(s))  Resp panel by RT-PCR (RSV, Flu A&B, Covid) Anterior Nasal Swab     Status: None   Collection Time: 05/21/23 11:15 AM   Specimen: Anterior Nasal Swab  Result Value Ref Range Status   SARS Coronavirus 2 by RT PCR NEGATIVE NEGATIVE Final    Comment: (NOTE) SARS-CoV-2 target nucleic acids are NOT DETECTED.  The SARS-CoV-2 RNA is generally detectable in upper respiratory specimens during the acute phase of infection. The lowest concentration of SARS-CoV-2 viral copies this assay can detect is 138 copies/mL. A negative result does not preclude SARS-Cov-2 infection and should not be used as the sole basis for treatment or other patient management decisions. A negative result may occur with  improper specimen collection/handling, submission of specimen other than nasopharyngeal swab, presence of viral mutation(s) within the areas targeted by this assay, and inadequate number of viral copies(<138 copies/mL). A negative result must be combined with clinical observations, patient history, and epidemiological information. The expected result is Negative.  Fact Sheet for Patients:  BloggerCourse.com  Fact Sheet for Healthcare Providers:  SeriousBroker.it  This test is no t yet approved or cleared by the [place] FDA and  has been authorized for detection and/or diagnosis of SARS-CoV-2 by FDA under an Emergency Use Authorization (EUA). This EUA will remain  in effect  (meaning this test can be used) for the duration of the COVID-19 declaration under Section 564(b)(1) of the Act, 21 U.S.C.section 360bbb-3(b)(1), unless the authorization is terminated  or revoked sooner.       Influenza A by PCR NEGATIVE NEGATIVE Final   Influenza B by PCR NEGATIVE NEGATIVE Final    Comment: (NOTE) The Xpert Xpress SARS-CoV-2/FLU/RSV plus assay is intended as an aid in the diagnosis of influenza from Nasopharyngeal swab specimens and should not be used as a sole basis for treatment. Nasal washings and aspirates are unacceptable for Xpert Xpress SARS-CoV-2/FLU/RSV testing.  Fact Sheet for Patients: BloggerCourse.com  Fact Sheet for Healthcare Providers: SeriousBroker.it  This test is not yet approved or cleared by the [place] FDA and has been authorized for detection and/or diagnosis of SARS-CoV-2 by FDA under an Emergency Use Authorization (EUA). This EUA will remain in effect (meaning this test can be used) for the duration of the COVID-19 declaration under Section 564(b)(1) of the Act, 21 U.S.C. section 360bbb-3(b)(1),  unless the authorization is terminated or revoked.     Resp Syncytial Virus by PCR NEGATIVE NEGATIVE Final    Comment: (NOTE) Fact Sheet for Patients: BloggerCourse.com  Fact Sheet for Healthcare Providers: SeriousBroker.it  This test is not yet approved or cleared by the [place] FDA and has been authorized for detection and/or diagnosis of SARS-CoV-2 by FDA under an Emergency Use Authorization (EUA). This EUA will remain in effect (meaning this test can be used) for the duration of the COVID-19 declaration under Section 564(b)(1) of the Act, 21 U.S.C. section 360bbb-3(b)(1), unless the authorization is terminated or revoked.  Performed at [facility], [address], [place], [place] [address]   Blood culture (routine x 2)      Status: None (Preliminary result)   Collection Time: 05/21/23 11:29 AM   Specimen: BLOOD  Result Value Ref Range Status   Specimen Description BLOOD LEFT ANTECUBITAL  Final   Special Requests   Final    BOTTLES DRAWN AEROBIC AND ANAEROBIC Blood Culture adequate volume   Culture   Final    NO GROWTH 4 DAYS Performed at [facility], [address] [place], [place] [address]    Report Status PENDING  Incomplete  Blood culture (routine x 2)     Status: None (Preliminary result)   Collection Time: 05/21/23 11:29 AM   Specimen: BLOOD LEFT HAND  Result Value Ref Range Status   Specimen Description   Final    BLOOD LEFT HAND BOTTLES DRAWN AEROBIC AND ANAEROBIC   Special Requests Blood Culture adequate volume  Final   Culture   Final    NO GROWTH 4 DAYS Performed at [facility], [address] [place], [place] [address]    Report Status PENDING  Incomplete  Culture, Respiratory w Gram Stain     Status: None   Collection Time: 05/22/23  6:01 AM   Specimen: Tracheal Aspirate; Respiratory  Result Value Ref Range Status   Specimen Description TRACHEAL ASPIRATE  Final   Special Requests NONE  Final   Gram Stain   Final    FEW WBC PRESENT,BOTH PMN AND MONONUCLEAR FEW GRAM POSITIVE COCCI    Culture   Final    Normal respiratory flora-no Staph aureus or Pseudomonas seen Performed at [facility], [address], [place], [place] [address]    Report Status 05/24/2023 FINAL  Final  MRSA Next Gen by PCR, Nasal     Status: Abnormal   Collection Time: 05/22/23  9:42 AM   Specimen: Nasal Mucosa; Nasal Swab  Result Value Ref Range Status   MRSA by PCR Next Gen DETECTED (A) NOT DETECTED Final    Comment: RESULT CALLED TO, READ BACK BY AND VERIFIED WITH: RN PATRICK ON [id number] @1108  BY SM (NOTE) The GeneXpert MRSA Assay (FDA approved for NASAL specimens only), is one component of a comprehensive MRSA colonization surveillance program. It is not intended to diagnose MRSA infection nor  to guide or monitor treatment for MRSA infections. Test performance is not FDA approved in patients less than 39 years old. Performed at [facility], [address] [place], [place] [address]      Labs: BNP (last 3 results) Recent Labs    02/08/23 0319 02/08/23 0844 05/21/23 1003  BNP 2,249.0* 2,032.0* >4,500.0*   Basic Metabolic Panel: Recent Labs  Lab 05/21/23 1003 05/22/23 0348 05/22/23 1007 05/22/23 1522 05/23/23 0318 05/24/23 0106  NA 138 142 144  --  141 140  K 4.0  3.5 4.0  --  3.3* 3.6  CL 105 109 106  --  105 105  CO2 19* 25 26  --  27 28  GLUCOSE 178* 82 85  --  105* 95  BUN 16 21* 20  --  18 28*  CREATININE 1.33* 1.63* 1.68*  --  1.29* 1.64*  CALCIUM 8.8* 8.0* 8.5*  --  8.5* 8.7*  MG  --  2.1  --  2.2 2.2  --   PHOS  --  3.5  --  3.8  --   --    Liver Function Tests: Recent Labs  Lab 05/19/23 0221  AST 17  ALT 8*  BILITOT 0.3  PROT 7.1   No results for input(s): "LIPASE", "AMYLASE" in the last 168 hours. No results for input(s): "AMMONIA" in the last 168 hours. CBC: Recent Labs  Lab 05/21/23 0948 05/22/23 0635 05/23/23 0318  WBC  --  4.8 5.0  HGB 15.6 10.9* 11.1*  HCT 46.0 33.2* 33.9*  MCV  --  91.5 92.6  PLT  --  123* 123*   Cardiac Enzymes: No results for input(s): "CKTOTAL", "CKMB", "CKMBINDEX", "TROPONINI" in the last 168 hours. BNP: Invalid input(s): "POCBNP" CBG: Recent Labs  Lab 05/21/23 1336 05/21/23 1415 05/22/23 1929 05/22/23 2320 05/23/23 0335  GLUCAP 61* 88 105* 100* 110*   D-Dimer No results for input(s): "DDIMER" in the last 72 hours. Hgb A1c No results for input(s): "HGBA1C" in the last 72 hours. Lipid Profile No results for input(s): "CHOL", "HDL", "LDLCALC", "TRIG", "CHOLHDL", "LDLDIRECT" in the last 72 hours. Thyroid function studies No results for input(s): "TSH", "T4TOTAL", "T3FREE", "THYROIDAB" in the last 72 hours.  Invalid input(s): "FREET3" Anemia work up No results for input(s):  "VITAMINB12", "FOLATE", "FERRITIN", "TIBC", "IRON", "RETICCTPCT" in the last 72 hours. Urinalysis    Component Value Date/Time   COLORURINE YELLOW 08/30/2022 0625   APPEARANCEUR CLEAR 08/30/2022 0625   LABSPEC 1.016 08/30/2022 0625   PHURINE 5.0 08/30/2022 0625   GLUCOSEU NEGATIVE 08/30/2022 0625   HGBUR SMALL (A) 08/30/2022 0625   BILIRUBINUR NEGATIVE 08/30/2022 0625   KETONESUR NEGATIVE 08/30/2022 0625   PROTEINUR 100 (A) 08/30/2022 0625   NITRITE NEGATIVE 08/30/2022 0625   LEUKOCYTESUR NEGATIVE 08/30/2022 0625   Sepsis Labs Recent Labs  Lab 05/22/23 0635 05/23/23 0318  WBC 4.8 5.0   Microbiology Recent Results (from the past 240 hour(s))  Resp panel by RT-PCR (RSV, Flu A&B, Covid) Anterior Nasal Swab     Status: None   Collection Time: 05/21/23 11:15 AM   Specimen: Anterior Nasal Swab  Result Value Ref Range Status   SARS Coronavirus 2 by RT PCR NEGATIVE NEGATIVE Final    Comment: (NOTE) SARS-CoV-2 target nucleic acids are NOT DETECTED.  The SARS-CoV-2 RNA is generally detectable in upper respiratory specimens during the acute phase of infection. The lowest concentration of SARS-CoV-2 viral copies this assay can detect is 138 copies/mL. A negative result does not preclude SARS-Cov-2 infection and should not be used as the sole basis for treatment or other patient management decisions. A negative result may occur with  improper specimen collection/handling, submission of specimen other than nasopharyngeal swab, presence of viral mutation(s) within the areas targeted by this assay, and inadequate number of viral copies(<138 copies/mL). A negative result must be combined with clinical observations, patient history, and epidemiological information. The expected result is Negative.  Fact Sheet for Patients:  BloggerCourse.com  Fact Sheet for Healthcare Providers:  SeriousBroker.it  This test is no t  yet approved or  cleared by the [place] and  has been authorized for detection and/or diagnosis of SARS-CoV-2 by FDA under an Emergency Use Authorization (EUA). This EUA will remain  in effect (meaning this test can be used) for the duration of the COVID-19 declaration under Section 564(b)(1) of the Act, 21 U.S.C.section 360bbb-3(b)(1), unless the authorization is terminated  or revoked sooner.       Influenza A by PCR NEGATIVE NEGATIVE Final   Influenza B by PCR NEGATIVE NEGATIVE Final    Comment: (NOTE) The Xpert Xpress SARS-CoV-2/FLU/RSV plus assay is intended as an aid in the diagnosis of influenza from Nasopharyngeal swab specimens and should not be used as a sole basis for treatment. Nasal washings and aspirates are unacceptable for Xpert Xpress SARS-CoV-2/FLU/RSV testing.  Fact Sheet for Patients: BloggerCourse.com  Fact Sheet for Healthcare Providers: SeriousBroker.it  This test is not yet approved or cleared by the [place] FDA and has been authorized for detection and/or diagnosis of SARS-CoV-2 by FDA under an Emergency Use Authorization (EUA). This EUA will remain in effect (meaning this test can be used) for the duration of the COVID-19 declaration under Section 564(b)(1) of the Act, 21 U.S.C. section 360bbb-3(b)(1), unless the authorization is terminated or revoked.     Resp Syncytial Virus by PCR NEGATIVE NEGATIVE Final    Comment: (NOTE) Fact Sheet for Patients: BloggerCourse.com  Fact Sheet for Healthcare Providers: SeriousBroker.it  This test is not yet approved or cleared by the [place] FDA and has been authorized for detection and/or diagnosis of SARS-CoV-2 by FDA under an Emergency Use Authorization (EUA). This EUA will remain in effect (meaning this test can be used) for the duration of the COVID-19 declaration under Section 564(b)(1) of the Act, 21  U.S.C. section 360bbb-3(b)(1), unless the authorization is terminated or revoked.  Performed at [facility], [address], [place], [place] [address]   Blood culture (routine x 2)     Status: None (Preliminary result)   Collection Time: 05/21/23 11:29 AM   Specimen: BLOOD  Result Value Ref Range Status   Specimen Description BLOOD LEFT ANTECUBITAL  Final   Special Requests   Final    BOTTLES DRAWN AEROBIC AND ANAEROBIC Blood Culture adequate volume   Culture   Final    NO GROWTH 4 DAYS Performed at [facility], [address] [place], [place] [address]    Report Status PENDING  Incomplete  Blood culture (routine x 2)     Status: None (Preliminary result)   Collection Time: 05/21/23 11:29 AM   Specimen: BLOOD LEFT HAND  Result Value Ref Range Status   Specimen Description   Final    BLOOD LEFT HAND BOTTLES DRAWN AEROBIC AND ANAEROBIC   Special Requests Blood Culture adequate volume  Final   Culture   Final    NO GROWTH 4 DAYS Performed at [facility], [address] [place], [place] [address]    Report Status PENDING  Incomplete  Culture, Respiratory w Gram Stain     Status: None   Collection Time: 05/22/23  6:01 AM   Specimen: Tracheal Aspirate; Respiratory  Result Value Ref Range Status   Specimen Description TRACHEAL ASPIRATE  Final   Special Requests NONE  Final   Gram Stain   Final    FEW WBC PRESENT,BOTH PMN AND MONONUCLEAR FEW GRAM POSITIVE COCCI    Culture   Final    Normal respiratory flora-no Staph aureus or Pseudomonas seen Performed at [facility]  [facility], [address] [place], [place] [address]    Report Status 05/24/2023 FINAL  Final  MRSA Next Gen by PCR, Nasal     Status: Abnormal   Collection Time: 05/22/23  9:42 AM   Specimen: Nasal Mucosa; Nasal Swab  Result Value Ref Range Status   MRSA by PCR Next Gen DETECTED (A) NOT DETECTED Final    Comment: RESULT CALLED TO, READ BACK BY AND VERIFIED WITH: RN PATRICK ON [id number] @[id number]  BY SM (NOTE) The  GeneXpert MRSA Assay (FDA approved for NASAL specimens only), is one component of a comprehensive MRSA colonization surveillance program. It is not intended to diagnose MRSA infection nor to guide or monitor treatment for MRSA infections. Test performance is not FDA approved in patients less than 20 years old. Performed at [facility], [address] [place], [place] [address]      Time coordinating discharge: 0 minutes.  Left AMA.  SIGNED:   Dorcas Carrow, MD  [facility] 05/25/2023, 4:52 PM

## 2023-05-26 LAB — CULTURE, BLOOD (ROUTINE X 2)
Culture: NO GROWTH
Culture: NO GROWTH
Special Requests: ADEQUATE

## 2023-05-27 ENCOUNTER — Ambulatory Visit: Payer: Medicare Other | Admitting: Family

## 2023-06-03 ENCOUNTER — Telehealth (HOSPITAL_COMMUNITY): Payer: Self-pay

## 2023-06-03 NOTE — Telephone Encounter (Signed)
Called and left patient a voice message to confirm/remind patient of their appointment at the [facility] on 06/04/23.   And to bring in all medications and/or complete list.

## 2023-06-04 ENCOUNTER — Encounter (HOSPITAL_COMMUNITY): Payer: Medicare Other

## 2023-06-09 MED FILL — Morphine Sulfate Inj 2 MG/ML: INTRAMUSCULAR | Qty: 1 | Status: AC

## 2023-06-15 ENCOUNTER — Ambulatory Visit: Payer: Medicare Other | Admitting: Family

## 2023-06-27 ENCOUNTER — Other Ambulatory Visit: Payer: Self-pay | Admitting: Family

## 2023-06-27 DIAGNOSIS — B20 Human immunodeficiency virus [HIV] disease: Secondary | ICD-10-CM

## 2023-07-23 NOTE — Progress Notes (Signed)
The 10-year ASCVD risk score (Arnett DK, et al., 2019) is: 8.7%   Values used to calculate the score:     Age: 42 years     Sex: Male     Is Non-Hispanic African American: Yes     Diabetic: No     Tobacco smoker: No     Systolic Blood Pressure: 154 mmHg     Is BP treated: Yes     HDL Cholesterol: 25 mg/dL     Total Cholesterol: 183 mg/dL  Sandie Ano, RN

## 2023-07-29 ENCOUNTER — Telehealth: Payer: Self-pay

## 2023-07-29 NOTE — Telephone Encounter (Signed)
Patient called asking can he be reestablished our office He does not want to see Dr Allena Katz did not like his interaction with him.  Looks like due to no  show [place] trouble with transportation, has a lot of heart problems and chemo.  Patient call back # [phone].

## 2023-07-30 NOTE — Telephone Encounter (Signed)
Patient ok to establish with preferred provider

## 2023-08-02 NOTE — Telephone Encounter (Signed)
Called patient left voicemail to return call.

## 2023-08-08 ENCOUNTER — Other Ambulatory Visit: Payer: Self-pay | Admitting: Family

## 2023-08-08 DIAGNOSIS — I509 Heart failure, unspecified: Secondary | ICD-10-CM

## 2023-08-09 NOTE — Telephone Encounter (Signed)
Please advise on refill request. Has appt with PCP in Oct.

## 2023-08-10 ENCOUNTER — Telehealth: Payer: Self-pay

## 2023-08-10 DIAGNOSIS — I509 Heart failure, unspecified: Secondary | ICD-10-CM

## 2023-08-10 MED ORDER — FUROSEMIDE 80 MG PO TABS
80.0000 mg | ORAL_TABLET | Freq: Every day | ORAL | 0 refills | Status: DC
Start: 2023-08-10 — End: 2023-08-31

## 2023-08-10 NOTE — Telephone Encounter (Signed)
Patient called front desk requesting a refill of his "water pill." Reports he is scheduled with a new PCP on 10/30 and would like to know if Tammy Sours can refill this until he's seen by primary care. He uses [facility] in [place].   Sandie Ano, RN

## 2023-08-10 NOTE — Addendum Note (Signed)
Addended by: Linna Hoff D on: 08/10/2023 01:36 PM   Modules accepted: Orders

## 2023-08-28 ENCOUNTER — Encounter (HOSPITAL_COMMUNITY): Payer: Self-pay

## 2023-08-28 ENCOUNTER — Inpatient Hospital Stay (HOSPITAL_COMMUNITY)
Admission: EM | Admit: 2023-08-28 | Discharge: 2023-08-31 | DRG: 208 | Disposition: A | Discharge status: Home / Self Care | Payer: Medicare Other | Attending: Internal Medicine | Admitting: Internal Medicine

## 2023-08-28 ENCOUNTER — Emergency Department (HOSPITAL_COMMUNITY): Payer: Medicare Other

## 2023-08-28 ENCOUNTER — Other Ambulatory Visit: Payer: Self-pay

## 2023-08-28 DIAGNOSIS — I2489 Other forms of acute ischemic heart disease: Secondary | ICD-10-CM | POA: Diagnosis present

## 2023-08-28 DIAGNOSIS — I130 Hypertensive heart and chronic kidney disease with heart failure and stage 1 through stage 4 chronic kidney disease, or unspecified chronic kidney disease: Secondary | ICD-10-CM | POA: Diagnosis present

## 2023-08-28 DIAGNOSIS — D638 Anemia in other chronic diseases classified elsewhere: Secondary | ICD-10-CM | POA: Diagnosis present

## 2023-08-28 DIAGNOSIS — J189 Pneumonia, unspecified organism: Secondary | ICD-10-CM | POA: Diagnosis present

## 2023-08-28 DIAGNOSIS — I509 Heart failure, unspecified: Secondary | ICD-10-CM

## 2023-08-28 DIAGNOSIS — F1410 Cocaine abuse, uncomplicated: Secondary | ICD-10-CM | POA: Diagnosis present

## 2023-08-28 DIAGNOSIS — N182 Chronic kidney disease, stage 2 (mild): Secondary | ICD-10-CM | POA: Diagnosis present

## 2023-08-28 DIAGNOSIS — I169 Hypertensive crisis, unspecified: Secondary | ICD-10-CM

## 2023-08-28 DIAGNOSIS — F17210 Nicotine dependence, cigarettes, uncomplicated: Secondary | ICD-10-CM | POA: Diagnosis present

## 2023-08-28 DIAGNOSIS — I421 Obstructive hypertrophic cardiomyopathy: Secondary | ICD-10-CM

## 2023-08-28 DIAGNOSIS — I161 Hypertensive emergency: Secondary | ICD-10-CM | POA: Diagnosis present

## 2023-08-28 DIAGNOSIS — R739 Hyperglycemia, unspecified: Secondary | ICD-10-CM | POA: Diagnosis present

## 2023-08-28 DIAGNOSIS — I10 Essential (primary) hypertension: Secondary | ICD-10-CM

## 2023-08-28 DIAGNOSIS — Z91048 Other nonmedicinal substance allergy status: Secondary | ICD-10-CM

## 2023-08-28 DIAGNOSIS — J9600 Acute respiratory failure, unspecified whether with hypoxia or hypercapnia: Secondary | ICD-10-CM

## 2023-08-28 DIAGNOSIS — J810 Acute pulmonary edema: Secondary | ICD-10-CM

## 2023-08-28 DIAGNOSIS — Z8249 Family history of ischemic heart disease and other diseases of the circulatory system: Secondary | ICD-10-CM | POA: Diagnosis not present

## 2023-08-28 DIAGNOSIS — I428 Other cardiomyopathies: Secondary | ICD-10-CM | POA: Diagnosis not present

## 2023-08-28 DIAGNOSIS — I422 Other hypertrophic cardiomyopathy: Secondary | ICD-10-CM | POA: Diagnosis present

## 2023-08-28 DIAGNOSIS — Z833 Family history of diabetes mellitus: Secondary | ICD-10-CM

## 2023-08-28 DIAGNOSIS — C851A Unspecified b-cell lymphoma, in remission: Secondary | ICD-10-CM | POA: Diagnosis present

## 2023-08-28 DIAGNOSIS — Z91148 Patient's other noncompliance with medication regimen for other reason: Secondary | ICD-10-CM

## 2023-08-28 DIAGNOSIS — R451 Restlessness and agitation: Secondary | ICD-10-CM | POA: Diagnosis present

## 2023-08-28 DIAGNOSIS — J9690 Respiratory failure, unspecified, unspecified whether with hypoxia or hypercapnia: Secondary | ICD-10-CM | POA: Diagnosis present

## 2023-08-28 DIAGNOSIS — J9602 Acute respiratory failure with hypercapnia: Secondary | ICD-10-CM | POA: Diagnosis present

## 2023-08-28 DIAGNOSIS — Z886 Allergy status to analgesic agent status: Secondary | ICD-10-CM

## 2023-08-28 DIAGNOSIS — J440 Chronic obstructive pulmonary disease with acute lower respiratory infection: Secondary | ICD-10-CM | POA: Diagnosis present

## 2023-08-28 DIAGNOSIS — I5031 Acute diastolic (congestive) heart failure: Secondary | ICD-10-CM | POA: Diagnosis not present

## 2023-08-28 DIAGNOSIS — I493 Ventricular premature depolarization: Secondary | ICD-10-CM | POA: Diagnosis present

## 2023-08-28 DIAGNOSIS — I5043 Acute on chronic combined systolic (congestive) and diastolic (congestive) heart failure: Secondary | ICD-10-CM | POA: Diagnosis present

## 2023-08-28 DIAGNOSIS — Z91199 Patient's noncompliance with other medical treatment and regimen due to unspecified reason: Secondary | ICD-10-CM

## 2023-08-28 DIAGNOSIS — J9601 Acute respiratory failure with hypoxia: Secondary | ICD-10-CM | POA: Diagnosis present

## 2023-08-28 DIAGNOSIS — Z807 Family history of other malignant neoplasms of lymphoid, hematopoietic and related tissues: Secondary | ICD-10-CM

## 2023-08-28 DIAGNOSIS — F1310 Sedative, hypnotic or anxiolytic abuse, uncomplicated: Secondary | ICD-10-CM | POA: Diagnosis present

## 2023-08-28 DIAGNOSIS — N179 Acute kidney failure, unspecified: Secondary | ICD-10-CM | POA: Diagnosis present

## 2023-08-28 DIAGNOSIS — I5023 Acute on chronic systolic (congestive) heart failure: Secondary | ICD-10-CM | POA: Diagnosis not present

## 2023-08-28 DIAGNOSIS — Z9911 Dependence on respirator [ventilator] status: Secondary | ICD-10-CM

## 2023-08-28 DIAGNOSIS — B20 Human immunodeficiency virus [HIV] disease: Secondary | ICD-10-CM | POA: Diagnosis present

## 2023-08-28 DIAGNOSIS — J690 Pneumonitis due to inhalation of food and vomit: Secondary | ICD-10-CM | POA: Diagnosis not present

## 2023-08-28 DIAGNOSIS — F1910 Other psychoactive substance abuse, uncomplicated: Secondary | ICD-10-CM | POA: Diagnosis not present

## 2023-08-28 DIAGNOSIS — Z7982 Long term (current) use of aspirin: Secondary | ICD-10-CM

## 2023-08-28 DIAGNOSIS — Z8269 Family history of other diseases of the musculoskeletal system and connective tissue: Secondary | ICD-10-CM

## 2023-08-28 DIAGNOSIS — I160 Hypertensive urgency: Secondary | ICD-10-CM | POA: Diagnosis not present

## 2023-08-28 DIAGNOSIS — Z79899 Other long term (current) drug therapy: Secondary | ICD-10-CM | POA: Diagnosis not present

## 2023-08-28 DIAGNOSIS — I214 Non-ST elevation (NSTEMI) myocardial infarction: Secondary | ICD-10-CM

## 2023-08-28 HISTORY — DX: Cardiomegaly: I51.7

## 2023-08-28 LAB — BLOOD GAS, VENOUS
Acid-base deficit: 1.1 mmol/L (ref 0.0–2.0)
Bicarbonate: 28.9 mmol/L — ABNORMAL HIGH (ref 20.0–28.0)
Drawn by: 27160
O2 Saturation: 85.8 %
Patient temperature: 36.5
pCO2, Ven: 72 mm[Hg] (ref 44–60)
pH, Ven: 7.21 — ABNORMAL LOW (ref 7.25–7.43)
pO2, Ven: 56 mm[Hg] — ABNORMAL HIGH (ref 32–45)

## 2023-08-28 LAB — COMPREHENSIVE METABOLIC PANEL
ALT: 25 U/L (ref 0–44)
AST: 34 U/L (ref 15–41)
Albumin: 3.8 g/dL (ref 3.5–5.0)
Alkaline Phosphatase: 81 U/L (ref 38–126)
Anion gap: 11 (ref 5–15)
BUN: 30 mg/dL — ABNORMAL HIGH (ref 6–20)
CO2: 27 mmol/L (ref 22–32)
Calcium: 8.4 mg/dL — ABNORMAL LOW (ref 8.9–10.3)
Chloride: 101 mmol/L (ref 98–111)
Creatinine, Ser: 2.21 mg/dL — ABNORMAL HIGH (ref 0.61–1.24)
GFR, Estimated: 38 mL/min — ABNORMAL LOW (ref >60)
Glucose, Bld: 243 mg/dL — ABNORMAL HIGH (ref 70–99)
Potassium: 4.5 mmol/L (ref 3.5–5.1)
Sodium: 139 mmol/L (ref 135–145)
Total Bilirubin: 0.6 mg/dL (ref 0.3–1.2)
Total Protein: 7.6 g/dL (ref 6.5–8.1)

## 2023-08-28 LAB — LACTIC ACID, PLASMA: Lactic Acid, Venous: 0.7 mmol/L (ref 0.5–1.9)

## 2023-08-28 LAB — CBC WITH DIFFERENTIAL/PLATELET
Abs Immature Granulocytes: 0.04 10*3/uL (ref 0.00–0.07)
Basophils Absolute: 0 10*3/uL (ref 0.0–0.1)
Basophils Relative: 0 %
Eosinophils Absolute: 0.1 10*3/uL (ref 0.0–0.5)
Eosinophils Relative: 1 %
HCT: 43.8 % (ref 39.0–52.0)
Hemoglobin: 14.1 g/dL (ref 13.0–17.0)
Immature Granulocytes: 0 %
Lymphocytes Relative: 35 %
Lymphs Abs: 4.1 10*3/uL — ABNORMAL HIGH (ref 0.7–4.0)
MCH: 31.3 pg (ref 26.0–34.0)
MCHC: 32.2 g/dL (ref 30.0–36.0)
MCV: 97.3 fL (ref 80.0–100.0)
Monocytes Absolute: 0.5 10*3/uL (ref 0.1–1.0)
Monocytes Relative: 4 %
Neutro Abs: 6.9 10*3/uL (ref 1.7–7.7)
Neutrophils Relative %: 60 %
Platelets: 256 10*3/uL (ref 150–400)
RBC: 4.5 MIL/uL (ref 4.22–5.81)
RDW: 15.4 % (ref 11.5–15.5)
WBC: 11.8 10*3/uL — ABNORMAL HIGH (ref 4.0–10.5)
nRBC: 0 % (ref 0.0–0.2)

## 2023-08-28 LAB — CBC
HCT: 35.1 % — ABNORMAL LOW (ref 39.0–52.0)
Hemoglobin: 11.7 g/dL — ABNORMAL LOW (ref 13.0–17.0)
MCH: 31.1 pg (ref 26.0–34.0)
MCHC: 33.3 g/dL (ref 30.0–36.0)
MCV: 93.4 fL (ref 80.0–100.0)
Platelets: 160 10*3/uL (ref 150–400)
RBC: 3.76 MIL/uL — ABNORMAL LOW (ref 4.22–5.81)
RDW: 15 % (ref 11.5–15.5)
WBC: 7.2 10*3/uL (ref 4.0–10.5)
nRBC: 0 % (ref 0.0–0.2)

## 2023-08-28 LAB — I-STAT ARTERIAL BLOOD GAS, ED
Acid-Base Excess: 4 mmol/L — ABNORMAL HIGH (ref 0.0–2.0)
Bicarbonate: 31.7 mmol/L — ABNORMAL HIGH (ref 20.0–28.0)
Calcium, Ion: 1.15 mmol/L (ref 1.15–1.40)
HCT: 36 % — ABNORMAL LOW (ref 39.0–52.0)
Hemoglobin: 12.2 g/dL — ABNORMAL LOW (ref 13.0–17.0)
O2 Saturation: 100 %
Patient temperature: 98.3
Potassium: 4.3 mmol/L (ref 3.5–5.1)
Sodium: 143 mmol/L (ref 135–145)
TCO2: 34 mmol/L — ABNORMAL HIGH (ref 22–32)
pCO2 arterial: 61 mm[Hg] — ABNORMAL HIGH (ref 32–48)
pH, Arterial: 7.323 — ABNORMAL LOW (ref 7.35–7.45)
pO2, Arterial: 362 mm[Hg] — ABNORMAL HIGH (ref 83–108)

## 2023-08-28 LAB — BLOOD GAS, ARTERIAL
Acid-Base Excess: 4.8 mmol/L — ABNORMAL HIGH (ref 0.0–2.0)
Bicarbonate: 35.1 mmol/L — ABNORMAL HIGH (ref 20.0–28.0)
Drawn by: 22179
O2 Saturation: 97 %
Patient temperature: 36.9
pCO2 arterial: 82 mm[Hg] (ref 32–48)
pH, Arterial: 7.24 — ABNORMAL LOW (ref 7.35–7.45)
pO2, Arterial: 89 mm[Hg] (ref 83–108)

## 2023-08-28 LAB — POCT I-STAT 7, (LYTES, BLD GAS, ICA,H+H)
Acid-Base Excess: 4 mmol/L — ABNORMAL HIGH (ref 0.0–2.0)
Bicarbonate: 30.9 mmol/L — ABNORMAL HIGH (ref 20.0–28.0)
Calcium, Ion: 1.17 mmol/L (ref 1.15–1.40)
HCT: 36 % — ABNORMAL LOW (ref 39.0–52.0)
Hemoglobin: 12.2 g/dL — ABNORMAL LOW (ref 13.0–17.0)
O2 Saturation: 97 %
Patient temperature: 98.3
Potassium: 4 mmol/L (ref 3.5–5.1)
Sodium: 143 mmol/L (ref 135–145)
TCO2: 32 mmol/L (ref 22–32)
pCO2 arterial: 53.1 mm[Hg] — ABNORMAL HIGH (ref 32–48)
pH, Arterial: 7.372 (ref 7.35–7.45)
pO2, Arterial: 98 mm[Hg] (ref 83–108)

## 2023-08-28 LAB — RAPID URINE DRUG SCREEN, HOSP PERFORMED
Amphetamines: NOT DETECTED
Barbiturates: NOT DETECTED
Benzodiazepines: POSITIVE — AB
Cocaine: POSITIVE — AB
Opiates: NOT DETECTED
Tetrahydrocannabinol: NOT DETECTED

## 2023-08-28 LAB — TROPONIN I (HIGH SENSITIVITY)
Troponin I (High Sensitivity): 147 ng/L (ref <18)
Troponin I (High Sensitivity): 156 ng/L (ref <18)

## 2023-08-28 LAB — CBG MONITORING, ED: Glucose-Capillary: 126 mg/dL — ABNORMAL HIGH (ref 70–99)

## 2023-08-28 LAB — MRSA NEXT GEN BY PCR, NASAL: MRSA by PCR Next Gen: NOT DETECTED

## 2023-08-28 LAB — BRAIN NATRIURETIC PEPTIDE: B Natriuretic Peptide: >4500 pg/mL — ABNORMAL HIGH (ref 0.0–100.0)

## 2023-08-28 MED ORDER — FENTANYL CITRATE PF 50 MCG/ML IJ SOSY
50.0000 ug | PREFILLED_SYRINGE | INTRAMUSCULAR | Status: DC | PRN
  Administered 2023-08-28 (×2): 100 ug via INTRAVENOUS
  Filled 2023-08-28 (×2): qty 2

## 2023-08-28 MED ORDER — DOCUSATE SODIUM 50 MG/5ML PO LIQD: 100.0000 mg | Freq: Two times a day (BID) | ORAL | Status: DC | PRN

## 2023-08-28 MED ORDER — FAMOTIDINE 20 MG PO TABS
20.0000 mg | ORAL_TABLET | Freq: Two times a day (BID) | ORAL | Status: DC
Start: 2023-08-28 — End: 2023-08-30
  Administered 2023-08-28 – 2023-08-30 (×4): 20 mg
  Filled 2023-08-28 (×4): qty 1

## 2023-08-28 MED ORDER — FUROSEMIDE 10 MG/ML IJ SOLN
80.0000 mg | Freq: Once | INTRAMUSCULAR | Status: AC
  Administered 2023-08-28: 80 mg via INTRAVENOUS
  Filled 2023-08-28: qty 8

## 2023-08-28 MED ORDER — ORAL CARE MOUTH RINSE: 15.0000 mL | OROMUCOSAL | Status: DC | PRN

## 2023-08-28 MED ORDER — MIDAZOLAM HCL 2 MG/2ML IJ SOLN
1.0000 mg | INTRAMUSCULAR | Status: DC | PRN
  Administered 2023-08-28: 2 mg via INTRAVENOUS
  Filled 2023-08-28: qty 2

## 2023-08-28 MED ORDER — NITROGLYCERIN IN D5W 200-5 MCG/ML-% IV SOLN
50.0000 ug/min | INTRAVENOUS | Status: DC
  Administered 2023-08-28: 150 ug/min via INTRAVENOUS
  Administered 2023-08-28: 50 ug/min via INTRAVENOUS
  Filled 2023-08-28 (×2): qty 250

## 2023-08-28 MED ORDER — FENTANYL CITRATE PF 50 MCG/ML IJ SOSY
50.0000 ug | PREFILLED_SYRINGE | Freq: Once | INTRAMUSCULAR | Status: AC
  Administered 2023-08-28: 50 ug via INTRAVENOUS
  Filled 2023-08-28: qty 1

## 2023-08-28 MED ORDER — POLYETHYLENE GLYCOL 3350 17 G PO PACK: 17.0000 g | PACK | Freq: Every day | ORAL | Status: DC | PRN

## 2023-08-28 MED ORDER — FUROSEMIDE 10 MG/ML IJ SOLN
80.0000 mg | Freq: Two times a day (BID) | INTRAMUSCULAR | Status: DC
  Administered 2023-08-28 – 2023-08-30 (×4): 80 mg via INTRAVENOUS
  Filled 2023-08-28 (×4): qty 8

## 2023-08-28 MED ORDER — ONDANSETRON HCL 4 MG/2ML IJ SOLN
4.0000 mg | Freq: Once | INTRAMUSCULAR | Status: AC
  Administered 2023-08-28: 4 mg via INTRAVENOUS
  Filled 2023-08-28: qty 2

## 2023-08-28 MED ORDER — HEPARIN SODIUM (PORCINE) 5000 UNIT/ML IJ SOLN
5000.0000 [IU] | Freq: Three times a day (TID) | INTRAMUSCULAR | Status: DC
  Administered 2023-08-28 – 2023-08-31 (×8): 5000 [IU] via SUBCUTANEOUS
  Filled 2023-08-28 (×8): qty 1

## 2023-08-28 MED ORDER — ROCURONIUM BROMIDE 10 MG/ML (PF) SYRINGE
PREFILLED_SYRINGE | INTRAVENOUS | Status: AC
  Administered 2023-08-28: 70 mg
  Filled 2023-08-28: qty 10

## 2023-08-28 MED ORDER — CHLORHEXIDINE GLUCONATE CLOTH 2 % EX PADS
6.0000 | MEDICATED_PAD | Freq: Every day | CUTANEOUS | Status: DC
  Administered 2023-08-28 – 2023-08-31 (×4): 6 via TOPICAL

## 2023-08-28 MED ORDER — PROPOFOL 1000 MG/100ML IV EMUL
INTRAVENOUS | Status: AC
  Filled 2023-08-28: qty 100

## 2023-08-28 MED ORDER — FENTANYL 2500MCG IN NS 250ML (10MCG/ML) PREMIX INFUSION
50.0000 ug/h | INTRAVENOUS | Status: DC
  Administered 2023-08-28: 50 ug/h via INTRAVENOUS
  Administered 2023-08-29: 150 ug/h via INTRAVENOUS
  Filled 2023-08-28 (×2): qty 250

## 2023-08-28 MED ORDER — FENTANYL CITRATE PF 50 MCG/ML IJ SOSY: 50.0000 ug | PREFILLED_SYRINGE | INTRAMUSCULAR | Status: DC | PRN

## 2023-08-28 MED ORDER — PROPOFOL 1000 MG/100ML IV EMUL
0.0000 ug/kg/min | INTRAVENOUS | Status: DC
  Administered 2023-08-28: 30 ug/kg/min via INTRAVENOUS
  Administered 2023-08-28 – 2023-08-29 (×4): 40 ug/kg/min via INTRAVENOUS
  Filled 2023-08-28 (×5): qty 100

## 2023-08-28 MED ORDER — LORAZEPAM 2 MG/ML IJ SOLN
1.0000 mg | Freq: Once | INTRAMUSCULAR | Status: AC
  Administered 2023-08-28: 1 mg via INTRAVENOUS
  Filled 2023-08-28: qty 1

## 2023-08-28 MED ORDER — FENTANYL BOLUS VIA INFUSION
50.0000 ug | INTRAVENOUS | Status: DC | PRN
  Administered 2023-08-28 – 2023-08-29 (×4): 50 ug via INTRAVENOUS

## 2023-08-28 MED ORDER — BICTEGRAVIR-EMTRICITAB-TENOFOV 50-200-25 MG PO TABS
1.0000 | ORAL_TABLET | Freq: Every day | ORAL | Status: DC
  Administered 2023-08-28 – 2023-08-31 (×4): 1 via ORAL
  Filled 2023-08-28 (×4): qty 1

## 2023-08-28 MED ORDER — NITROGLYCERIN IN D5W 200-5 MCG/ML-% IV SOLN
0.0000 ug/min | INTRAVENOUS | Status: DC
  Administered 2023-08-28: 100 ug/min via INTRAVENOUS
  Filled 2023-08-28: qty 250

## 2023-08-28 MED ORDER — ETOMIDATE 2 MG/ML IV SOLN
INTRAVENOUS | Status: AC
  Administered 2023-08-28: 20 mg
  Filled 2023-08-28: qty 20

## 2023-08-28 MED ORDER — ORAL CARE MOUTH RINSE
15.0000 mL | OROMUCOSAL | Status: DC
  Administered 2023-08-28 – 2023-08-29 (×13): 15 mL via OROMUCOSAL

## 2023-08-28 NOTE — Consult Note (Signed)
Cardiology Consultation:   Patient ID: Richard Cox; [id number]; 05/09/1981   Admit date: 08/28/2023 Date of Consult: 08/28/2023  Primary Care Provider: Pcp, No Primary Cardiologist: Nona Dell, MD  Primary Electrophysiologist:  None   Patient Profile:   Richard Cox is a 42 y.o. male with a hx of hypertrophic cardiomyopathy who is being seen today for the evaluation of abnormal echo at the request of Dr. Denese Killings.  History of Present Illness:   Richard Cox presented to [facility] with SOB and developed respiratory distress and is now intubated and sedated.   The history is from the chart.  He has had many presentations like this related to med non adherence.  He was again reported to not be taking his meds at home.  He came to the ED via private transportation.  He was having nausea and vomiting on presentation.    He was treated with ativan and BiPAP.  He eventually became more agitated and had to be intubated.  BP initially was 233/165.  EKG with sinus tach.   He has had hypertension and hypertensive urgency.  He has a history of non compliance with meds.  Looking through Care Everywhere he has had a reduced EF since at least 2020 and severe LVH.  4 years ago when seen at [facility] he was a no show for further work up of the hypertrophy.   He eventually, in 2022, had an MRI and was found to have HCM without obstruction.  His LV septum was 31.  EF was 30 - 35%.   He eventually established with [place] and is seen in the Advanced [facility] clinic.  He has had multiple admissions for respiratory failure.  He was last seen by Dr. Shirlee Latch in July of this year when he was hospitalized for acute acute on chronic HFrEF. Last echo at that time demonstrated the EF to be 25 - 30%.  Of note there was a plan for right heart cath but he left AMA.  He was a no show for follow up.      Past Medical History:  Diagnosis Date   Acute respiratory failure with hypoxia (HCC) 08/24/2021   Chronic HFrEF (heart failure with reduced  ejection fraction) (HCC)    CKD (chronic kidney disease) stage 2, GFR 60-89 ml/min    Endotracheally intubated 08/24/2021   Essential hypertension    HIV infection (HCC)    Lymphoma (HCC) 08/28/2020   Noncompliance with medication regimen    Polysubstance abuse (HCC)    Secondary cardiomyopathy (HCC)     Past Surgical History:  Procedure Laterality Date   IR IMAGING GUIDED PORT INSERTION  08/23/2020   Right   NO PAST SURGERIES       Home Medications:  Prior to Admission medications   Medication Sig Start Date End Date Taking? Authorizing Provider  acetaminophen (TYLENOL) 500 MG tablet Take 1,000 mg by mouth every 6 (six) hours as needed for moderate pain or headache.    [provider]  aspirin 81 MG chewable tablet Chew 1 tablet (81 mg total) by mouth daily. Patient not taking: Reported on 05/19/2023 02/08/23   Kendell Bane, MD  BIKTARVY 50-200-25 MG TABS tablet TAKE 1 TABLET BY MOUTH DAILY 06/28/23   Veryl Speak, FNP  carvedilol (COREG) 25 MG tablet Take 1 tablet (25 mg total) by mouth 2 (two) times daily with a meal. 05/24/23 08/22/23  Dorcas Carrow, MD  famotidine (PEPCID) 20 MG tablet Take 1 tablet (20 mg total)  by mouth daily. 05/25/23 06/24/23  Dorcas Carrow, MD  furosemide (LASIX) 80 MG tablet Take 1 tablet (80 mg total) by mouth daily. 08/10/23 11/08/23  Veryl Speak, FNP  hydrALAZINE (APRESOLINE) 100 MG tablet Take 0.5 tablets (50 mg total) by mouth 3 (three) times daily. 02/08/23 06/08/23  Kendell Bane, MD  isosorbide mononitrate (IMDUR) 30 MG 24 hr tablet Take 1 tablet (30 mg total) by mouth daily. 05/24/23 06/23/23  Dorcas Carrow, MD  spironolactone (ALDACTONE) 25 MG tablet Take 0.5 tablets (12.5 mg total) by mouth daily. 05/24/23 06/23/23  Dorcas Carrow, MD    Inpatient Medications: Scheduled Meds:  Chlorhexidine Gluconate Cloth  6 each Topical Daily   mouth rinse  15 mL Mouth Rinse Q2H   Continuous Infusions:  fentaNYL infusion INTRAVENOUS  100 mcg/hr (08/28/23 1500)   nitroGLYCERIN 100 mcg/min (08/28/23 1530)   propofol (DIPRIVAN) infusion 40 mcg/kg/min (08/28/23 1500)   PRN Meds: fentaNYL, midazolam, mouth rinse  Allergies:    Allergies  Allergen Reactions   Motrin [Ibuprofen] Hypertension   Neosporin [Neomycin-Bacitracin Zn-Polymyx] Rash    Social History:   Social History   Socioeconomic History   Marital status: Single    Spouse name: Not on file   Number of children: 0   Years of education: 13   Highest education level: Not on file  Occupational History   Not on file  Tobacco Use   Smoking status: Former    Current packs/day: 0.25    Types: Cigarettes   Smokeless tobacco: Never  Vaping Use   Vaping status: Never Used  Substance and Sexual Activity   Alcohol use: Not Currently   Drug use: Yes    Types: Marijuana, Cocaine   Sexual activity: Not Currently    Comment: given condoms  Other Topics Concern   Not on file  Social History Narrative   Not on file   Social Determinants of Health   Financial Resource Strain: Low Risk  (09/10/2020)   Overall Financial Resource Strain (CARDIA)    Difficulty of Paying Living Expenses: Not very hard  Food Insecurity: No Food Insecurity (09/05/2022)   Hunger Vital Sign    Worried About Running Out of Food in the Last Year: Never true    Ran Out of Food in the Last Year: Never true  Transportation Needs: No Transportation Needs (09/05/2022)   PRAPARE - Administrator, Civil Service (Medical): No    Lack of Transportation (Non-Medical): No  Physical Activity: Insufficiently Active (09/10/2020)   Exercise Vital Sign    Days of Exercise per Week: 5 days    Minutes of Exercise per Session: 20 min  Stress: No Stress Concern Present (09/10/2020)   [employer] of Occupational Health - Occupational Stress Questionnaire    Feeling of Stress : Only a little  Social Connections: Socially Isolated (09/10/2020)   Social Connection and Isolation Panel  [NHANES]    Frequency of Communication with Friends and Family: More than three times a week    Frequency of Social Gatherings with Friends and Family: Never    Attends Religious Services: Never    Database administrator or Organizations: No    Attends Engineer, structural: Not on file    Marital Status: Never married  Intimate Partner Violence: Not At Risk (09/05/2022)   Humiliation, Afraid, Rape, and Kick questionnaire    Fear of Current or Ex-Partner: No    Emotionally Abused: No    Physically Abused: No  Sexually Abused: No    Family History:   Family History  Problem Relation Age of Onset   Diabetes Mellitus II Mother    Cancer Mother        multiple myeloma   Cancer Father        multiple myeloma   Congestive Heart Failure Brother    Heart Problems Brother    Lupus Brother      ROS:  Unable to obtain     Physical Exam/Data:   Vitals:   08/28/23 1515 08/28/23 1520 08/28/23 1530 08/28/23 1545  BP: 110/80  115/84 116/88  Pulse: 82 81 78 81  Resp: 18 18 18 19   Temp:      TempSrc:      SpO2: 98% 97% 98% 98%  Weight:      Height:        Intake/Output Summary (Last 24 hours) at 08/28/2023 1550 Last data filed at 08/28/2023 1500 Gross per 24 hour  Intake 480.77 ml  Output 150 ml  Net 330.77 ml   Filed Weights   08/28/23 0608  Weight: 73 kg   Body mass index is 21.23 kg/m.  GENERAL:  Intubated and sedated HEENT:  Oral intubation NECK:  Carotid upstroke brisk and symmetric without bruit LYMPHATICS:  No cervical, inguinal adenopathy LUNGS:   Clear to auscultation bilaterally BACK:  No CVA tenderness CHEST:   Unremarkable HEART:  PMI is displaced far into the mid axillary line S1 and S2 within normal limits, no S3, positive  S4, no clicks, no rubs, no murmurs ABD:  Flat, positive bowel sounds decreased frequency and pitch, no bruits, no rebound, no guarding, no midline pulsatile mass, no hepatomegaly, no splenomegaly EXT:  2 plus pulses  throughout, no edema, no cyanosis no clubbing SKIN:  No rashes no nodules NEURO:  Intubated and sedated PSYCH:  Intubated and sedated   EKG:  The EKG was personally reviewed and demonstrates:  NSR, rate 82, LVH with repolarization, biatrial enlargement.  Prolonged QT.  Not significant different than previous EKGs Telemetry:  Telemetry was personally reviewed and demonstrates:  ST, SR  Relevant CV Studies: NA  Laboratory Data:  Chemistry Recent Labs  Lab 08/28/23 0702 08/28/23 1333  NA 139 143  K 4.5 4.3  CL 101  --   CO2 27  --   GLUCOSE 243*  --   BUN 30*  --   CREATININE 2.21*  --   CALCIUM 8.4*  --   GFRNONAA 38*  --   ANIONGAP 11  --     Recent Labs  Lab 08/28/23 0702  PROT 7.6  ALBUMIN 3.8  AST 34  ALT 25  ALKPHOS 81  BILITOT 0.6   Hematology Recent Labs  Lab 08/28/23 0702 08/28/23 1333  WBC 11.8*  --   RBC 4.50  --   HGB 14.1 12.2*  HCT 43.8 36.0*  MCV 97.3  --   MCH 31.3  --   MCHC 32.2  --   RDW 15.4  --   PLT 256  --    Cardiac EnzymesNo results for input(s): "TROPONINI" in the last 168 hours. No results for input(s): "TROPIPOC" in the last 168 hours.  BNP Recent Labs  Lab 08/28/23 0702  BNP >4,500.0*    DDimer No results for input(s): "DDIMER" in the last 168 hours.  Radiology/Studies:  DG Chest Port 1 View  Result Date: 08/28/2023 CLINICAL DATA:  Shortness of breath EXAM: PORTABLE CHEST 1 VIEW COMPARISON:  05/21/2023 FINDINGS: Endotracheal tube  with tip between the clavicular heads and carina. An enteric tube tip reaches the stomach with side port near the GE junction. Porta catheter with tip at the SVC. Cardiomegaly and diffuse interstitial opacity with Kerley lines and cephalized blood flow. No focal consolidation, significant effusion, or pneumothorax. IMPRESSION: 1. CHF pattern. 2. Located enteric and endotracheal tubes. Electronically Signed   By: Tiburcio Pea M.D.   On: 08/28/2023 07:42    Assessment and Plan:   Acute  respiratory failure:  Unfortunately, he has had recurrent presentations for acute pulm edema mostly related to running out of or not taking meds.  Agree with aggressive IV diuresis.  NTG IV to be weaned as tolerated and resume GDMT when taking POs.   Unfortunately, I am not sure there is any long term therapy that will help with this patient.  It might be prudent to have a goals of therapy discussion with him by involving palliative care.    HCM:    He has massive left ventricular hypertrophy as seen on the bedside echo performed by Dr. Denese Killings.  EF appears to be in the 30% range.  No need for repeat echo.     Hypertensive urgency:  Currently BP is controlled on IV NTG.  Will resume goal directed medical therapy as above.   Elevated troponin and abnormal EKG:  Consistent with hypertensive urgency and LVH.  I do not suspect any acute coronary syndrome.  No ischemia work up is planned.      For questions or updates, please contact [facility] Please consult [email or web address] for contact info under Cardiology/STEMI.   Signed, Rollene Rotunda, MD  08/28/2023 3:50 PM

## 2023-08-28 NOTE — ED Notes (Signed)
BiPAP on Hold as patient will not keep on face. PATIENT ON 100 percent NRB mask

## 2023-08-28 NOTE — ED Notes (Signed)
Patient arrived from [facility], in transfer to hospital bed patient awaken became combative and pulling on tube. Fentanyl bolus and drip given. Patient is now resting comfortably.

## 2023-08-28 NOTE — ED Notes (Signed)
Patient pulling at lines, Fentanyl bolus given

## 2023-08-28 NOTE — ED Provider Notes (Signed)
[place] [facility] DE[facility] AT [facility] Provider Note   CSN: [id number] Arrival date & time: 08/28/23  9147     History  Chief Complaint  Patient presents with   Shortness of Breath    Richard Cox is a 42 y.o. male.  42 year old male, limited history secondary to acute respiratory distress.  History of heart failure.  Has been intubated in the past for the same.  Also history of anxiety.  Questionable history of COPD.  History of HIV.  Has a past history of noncompliance of medication and cocaine use.  Also has a past history of leaving the hospital AMA.  Sounds like he states he has not take his blood pressure medication in a day or 2 nor is he taking his Lasix.  Last couple hours had worsening dyspnea so present here for further evaluation.  Per nursing the patient has been somewhat noncompliant here and having emesis as well.  Denies edema or abdominal distention.  Patient denies fevers.   Shortness of Breath      Home Medications Prior to Admission medications   Medication Sig Start Date End Date Taking? Authorizing Provider  acetaminophen (TYLENOL) 500 MG tablet Take 1,000 mg by mouth every 6 (six) hours as needed for moderate pain or headache.    [provider]  aspirin 81 MG chewable tablet Chew 1 tablet (81 mg total) by mouth daily. Patient not taking: Reported on 05/19/2023 02/08/23   Kendell Bane, MD  BIKTARVY 50-200-25 MG TABS tablet TAKE 1 TABLET BY MOUTH DAILY 06/28/23   Veryl Speak, FNP  carvedilol (COREG) 25 MG tablet Take 1 tablet (25 mg total) by mouth 2 (two) times daily with a meal. 05/24/23 08/22/23  Dorcas Carrow, MD  famotidine (PEPCID) 20 MG tablet Take 1 tablet (20 mg total) by mouth daily. 05/25/23 06/24/23  Dorcas Carrow, MD  furosemide (LASIX) 80 MG tablet Take 1 tablet (80 mg total) by mouth daily. 08/10/23 11/08/23  Veryl Speak, FNP  hydrALAZINE (APRESOLINE) 100 MG tablet Take 0.5 tablets (50 mg total) by mouth 3  (three) times daily. 02/08/23 06/08/23  Kendell Bane, MD  isosorbide mononitrate (IMDUR) 30 MG 24 hr tablet Take 1 tablet (30 mg total) by mouth daily. 05/24/23 06/23/23  Dorcas Carrow, MD  spironolactone (ALDACTONE) 25 MG tablet Take 0.5 tablets (12.5 mg total) by mouth daily. 05/24/23 06/23/23  Dorcas Carrow, MD      Allergies    Motrin [ibuprofen] and Neosporin [neomycin-bacitracin zn-polymyx]    Review of Systems   Review of Systems  Respiratory:  Positive for shortness of breath.     Physical Exam Updated Vital Signs BP (!) 203/129   Pulse (!) 127   Temp 97.7 F (36.5 C) (Oral)   Resp 18   Ht 6\' 1"  (1.854 m)   Wt 73 kg   SpO2 99%   BMI 21.23 kg/m  Physical Exam Vitals and nursing note reviewed.  Constitutional:      General: He is in acute distress.     Appearance: He is ill-appearing and diaphoretic.  Cardiovascular:     Rate and Rhythm: Tachycardia present.     Comments: Significantly hypertensive Pulmonary:     Breath sounds: Decreased breath sounds present.     Comments: Oxygen saturation on pulse ox is in the upper 60s with a good Pleth Musculoskeletal:     Cervical back: Normal range of motion.     Right lower leg: No edema.  Left lower leg: No edema.     ED Results / Procedures / Treatments   Labs (all labs ordered are listed, but only abnormal results are displayed) Labs Reviewed  BRAIN NATRIURETIC PEPTIDE - Abnormal; Notable for the following components:      Result Value   B Natriuretic Peptide >4,500.0 (*)    All other components within normal limits  CBC WITH DIFFERENTIAL/PLATELET - Abnormal; Notable for the following components:   WBC 11.8 (*)    Lymphs Abs 4.1 (*)    All other components within normal limits  COMPREHENSIVE METABOLIC PANEL - Abnormal; Notable for the following components:   Glucose, Bld 243 (*)    BUN 30 (*)    Creatinine, Ser 2.21 (*)    Calcium 8.4 (*)    GFR, Estimated 38 (*)    All other components within normal  limits  BLOOD GAS, VENOUS - Abnormal; Notable for the following components:   pH, Ven 7.21 (*)    pCO2, Ven 72 (*)    pO2, Ven 56 (*)    Bicarbonate 28.9 (*)    All other components within normal limits  TROPONIN I (HIGH SENSITIVITY) - Abnormal; Notable for the following components:   Troponin I (High Sensitivity) 147 (*)    All other components within normal limits  BLOOD GAS, ARTERIAL  TROPONIN I (HIGH SENSITIVITY)    EKG None  Radiology DG Chest Port 1 View  Result Date: 08/28/2023 CLINICAL DATA:  Shortness of breath EXAM: PORTABLE CHEST 1 VIEW COMPARISON:  05/21/2023 FINDINGS: Endotracheal tube with tip between the clavicular heads and carina. An enteric tube tip reaches the stomach with side port near the GE junction. Porta catheter with tip at the SVC. Cardiomegaly and diffuse interstitial opacity with Kerley lines and cephalized blood flow. No focal consolidation, significant effusion, or pneumothorax. IMPRESSION: 1. CHF pattern. 2. Located enteric and endotracheal tubes. Electronically Signed   By: Tiburcio Pea M.D.   On: 08/28/2023 07:42    Procedures .Critical Care  Performed by: Marily Memos, MD Authorized by: Marily Memos, MD   Critical care provider statement:    Critical care time (minutes):  35   Critical care was necessary to treat or prevent imminent or life-threatening deterioration of the following conditions:  Respiratory failure   Critical care was time spent personally by me on the following activities:  Development of treatment plan with patient or surrogate, discussions with consultants, evaluation of patient's response to treatment, examination of patient, ordering and review of laboratory studies, ordering and review of radiographic studies, ordering and performing treatments and interventions, pulse oximetry, re-evaluation of patient's condition and review of old charts Procedure Name: Intubation Date/Time: 08/28/2023 7:54 AM  Performed by: Marily Memos, MDPre-anesthesia Checklist: Patient identified, Patient being monitored, Emergency Drugs available, Timeout performed and Suction available Oxygen Delivery Method: Non-rebreather mask Preoxygenation: Pre-oxygenation with 100% oxygen Induction Type: Rapid sequence Ventilation: Mask ventilation without difficulty Laryngoscope Size: Glidescope and 4 Grade View: Grade I Tube size: 8.0 mm Number of attempts: 1 Airway Equipment and Method: Rigid stylet Placement Confirmation: ETT inserted through vocal cords under direct vision, CO2 detector and Breath sounds checked- equal and bilateral Secured at: 25 cm Tube secured with: ETT holder Future Recommendations: Recommend- induction with short-acting agent, and alternative techniques readily available        Medications Ordered in ED Medications  nitroGLYCERIN 50 mg in dextrose 5 % 250 mL (0.2 mg/mL) infusion (50 mcg/min Intravenous New Bag/Given 08/28/23 0649)  fentaNYL (  SUBLIMAZE) injection 50 mcg (has no administration in time range)  fentaNYL (SUBLIMAZE) injection 50-200 mcg (has no administration in time range)  propofol (DIPRIVAN) 1000 MG/100ML infusion (30 mcg/kg/min  73 kg Intravenous Rate/Dose Change 08/28/23 0819)  midazolam (VERSED) injection 1-2 mg (has no administration in time range)  LORazepam (ATIVAN) injection 1 mg (1 mg Intravenous Given 08/28/23 0648)  ondansetron (ZOFRAN) injection 4 mg (4 mg Intravenous Given 08/28/23 0647)  furosemide (LASIX) injection 80 mg (80 mg Intravenous Given 08/28/23 0648)  rocuronium bromide 100 MG/10ML SOSY (70 mg  Given 08/28/23 0711)  etomidate (AMIDATE) 2 MG/ML injection (20 mg  Given 08/28/23 0710)  propofol (DIPRIVAN) 1000 MG/100ML infusion (  New Bag/Given 08/28/23 6213)    ED Course/ Medical Decision Making/ A&P                                 Medical Decision Making Amount and/or Complexity of Data Reviewed Labs: ordered. Radiology: ordered.  Risk Prescription drug  management.  Here with what appears to be SCAPE.  Could be exacerbated a little bit by anxiety however does have what appears to be hypoxia.  It appears he may have had some issues with compliant with BiPAP in the past so we will initiate Ativan with the BiPAP.  Nitroglycerin infusion aggressively titrated for blood pressure and breathing.  Lasix 80 IV.  Will check CBC, CMP, BNP.  He also has a history of HIV and is afebrile so I think that pneumonia is unlikely at this time.  Has been vomiting per nursing so may have aspirated as well.  Chest x-ray ordered.  Not currently vomiting or nauseous so I do not think this is a contraindication for BiPAP at this time.  Has a history of cocaine abuse but denies that at this time. Patient increased anxiety even with Ativan.  Patient starting to pull off his nonrebreather refusing to use it.  Had a couple episodes of emesis again.  Was point at his port.  Seems more confused than earlier and less redirectable.  Decision made to intubate for airway protection along with oxygenation and diuresis and completion of workup.  Intubated as per above without significant difficulty.  We use propofol along with the nitroglycerin to try to help with blood pressure control.  Lasix already given.  Will put in catheter to measure closely.  Will discuss with critical care for admission. X-ray viewed by myself and interpreted as ET tube in place with diffuse pulmonary edema.  BNP greater than 4500, troponin elevated likely secondary to the heart failure and hypertensive crisis.hold on heparin for now. Already working on BP control and diuresis. PCCM consult pending for admit.  Care transferred pending call back.   Final Clinical Impression(s) / ED Diagnoses Final diagnoses:  Acute respiratory failure with hypoxia and hypercapnia (HCC)  Acute pulmonary edema (HCC)  AKI (acute kidney injury) (HCC)  Hypertensive crisis  NSTEMI (non-ST elevated myocardial infarction) [facility])     Rx / DC Orders ED Discharge Orders     None         Cotton Beckley, Barbara Cower, MD 08/28/23 [phone]

## 2023-08-28 NOTE — ED Notes (Signed)
ED TO INPATIENT HANDOFF REPORT  ED Nurse Name and Phone #:   S Name/Age/Gender Richard Cox 42 y.o. male Room/Bed: 035C/035C  Code Status   Code Status: Prior  Home/SNF/Other Home  Is this baseline? No   Triage Complete: Triage complete  Chief Complaint Respiratory failure (HCC) [J96.90]  Triage Note Pt arrived via POV from home c/o worsening SOB. Pt reports Hx of CHF and ran out of his 80mg  Lasix 1 day ago. Pt reports Hx of anxiety and presents with active emesis. Pt difficult to get out of vehicle in parking lot and takes multiple efforts to redirect so ED Staff can assist and provide care.    Allergies Allergies  Allergen Reactions   Motrin [Ibuprofen] Hypertension   Neosporin [Neomycin-Bacitracin Zn-Polymyx] Rash    Level of Care/Admitting Diagnosis ED Disposition     ED Disposition  Admit   Condition  --   Comment  Hospital [facility] [[id number]]  Level of Care: ICU [6]  May admit patient to [facility] or [facility] if equivalent level of care is available:: No  Interfacility transfer: Yes  Covid Evaluation: Asymptomatic - no recent exposure (last 10 days) testing not required  Diagnosis: Respiratory failure [facility]) [[id number]]  Admitting Physician: Raechel Chute [[id number]]  Attending Physician: Raechel Chute [[id number]]  Certification:: I certify this patient will need inpatient services for at least 2 midnights  Expected Medical Readiness: 08/31/2023          B Medical/Surgery History Past Medical History:  Diagnosis Date   Acute respiratory failure with hypoxia (HCC) 08/24/2021   Chronic HFrEF (heart failure with reduced ejection fraction) (HCC)    CKD (chronic kidney disease) stage 2, GFR 60-89 ml/min    Endotracheally intubated 08/24/2021   Essential hypertension    HIV infection (HCC)    Lymphoma (HCC) 08/28/2020   Noncompliance with medication regimen    Polysubstance abuse (HCC)    Secondary cardiomyopathy (HCC)     Past Surgical History:  Procedure Laterality Date   IR IMAGING GUIDED PORT INSERTION  08/23/2020   Right   NO PAST SURGERIES       A IV Location/Drains/Wounds Patient Lines/Drains/Airways Status     Active Line/Drains/Airways     Name Placement date Placement time Site Days   Implanted Port 02/07/23 02/07/23  1150  --  202   Urethral Catheter Hailey S NT3 Temperature probe 14 Fr. 08/28/23  0753  Temperature probe  less than 1   Airway 8 mm 08/28/23  0710  -- less than 1            Intake/Output Last 24 hours No intake or output data in the 24 hours ending 08/28/23 1310  Labs/Imaging Results for orders placed or performed during the hospital encounter of 08/28/23 (from the past 48 hour(s))  Brain natriuretic peptide     Status: Abnormal   Collection Time: 08/28/23  7:02 AM  Result Value Ref Range   B Natriuretic Peptide >4,500.0 (H) 0.0 - 100.0 pg/mL    Comment: Performed at [facility], [address], [place], [place] [address]  CBC with Differential     Status: Abnormal   Collection Time: 08/28/23  7:02 AM  Result Value Ref Range   WBC 11.8 (H) 4.0 - 10.5 K/uL   RBC 4.50 4.22 - 5.81 MIL/uL   Hemoglobin 14.1 13.0 - 17.0 g/dL   HCT 53.6 64.4 - 03.4 %   MCV 97.3 80.0 - 100.0 fL   MCH  31.3 26.0 - 34.0 pg   MCHC 32.2 30.0 - 36.0 g/dL   RDW 16.1 09.6 - 04.5 %   Platelets 256 150 - 400 K/uL   nRBC 0.0 0.0 - 0.2 %   Neutrophils Relative % 60 %   Neutro Abs 6.9 1.7 - 7.7 K/uL   Lymphocytes Relative 35 %   Lymphs Abs 4.1 (H) 0.7 - 4.0 K/uL   Monocytes Relative 4 %   Monocytes Absolute 0.5 0.1 - 1.0 K/uL   Eosinophils Relative 1 %   Eosinophils Absolute 0.1 0.0 - 0.5 K/uL   Basophils Relative 0 %   Basophils Absolute 0.0 0.0 - 0.1 K/uL   Immature Granulocytes 0 %   Abs Immature Granulocytes 0.04 0.00 - 0.07 K/uL    Comment: Performed at [facility], [address], [place], [place] [address]  Comprehensive metabolic panel     Status: Abnormal   Collection Time:  08/28/23  7:02 AM  Result Value Ref Range   Sodium 139 135 - 145 mmol/L   Potassium 4.5 3.5 - 5.1 mmol/L   Chloride 101 98 - 111 mmol/L   CO2 27 22 - 32 mmol/L   Glucose, Bld 243 (H) 70 - 99 mg/dL    Comment: Glucose reference range applies only to samples taken after fasting for at least 8 hours.   BUN 30 (H) 6 - 20 mg/dL   Creatinine, Ser 1.91 (H) 0.61 - 1.24 mg/dL   Calcium 8.4 (L) 8.9 - 10.3 mg/dL   Total Protein 7.6 6.5 - 8.1 g/dL   Albumin 3.8 3.5 - 5.0 g/dL   AST 34 15 - 41 U/L   ALT 25 0 - 44 U/L   Alkaline Phosphatase 81 38 - 126 U/L   Total Bilirubin 0.6 0.3 - 1.2 mg/dL   GFR, Estimated 38 (L) >60 mL/min    Comment: (NOTE) Calculated using the CKD-EPI Creatinine Equation (2021)    Anion gap 11 5 - 15    Comment: Performed at [facility], [address], [place], [place] [address]  Troponin I (High Sensitivity)     Status: Abnormal   Collection Time: 08/28/23  7:02 AM  Result Value Ref Range   Troponin I (High Sensitivity) 147 (HH) <18 ng/L    Comment: CRITICAL RESULT CALLED TO, READ BACK BY AND VERIFIED WITH NEWMAN J @ 0745 ON [id number] BY HENDERSON L (NOTE) Elevated high sensitivity troponin I (hsTnI) values and significant  changes across serial measurements may suggest ACS but many other  chronic and acute conditions are known to elevate hsTnI results.  Refer to the "Links" section for chest pain algorithms and additional  guidance. Performed at [facility], [address] [place], [place] [address]   Blood gas, venous (at [facility])     Status: Abnormal   Collection Time: 08/28/23  7:02 AM  Result Value Ref Range   pH, Ven 7.21 (L) 7.25 - 7.43   pCO2, Ven 72 (HH) 44 - 60 mmHg    Comment: CRITICAL RESULT CALLED TO, READ BACK BY AND VERIFIED WITH: MESNER @ 0719 ON [id number] BY HENDERSON L    pO2, Ven 56 (H) 32 - 45 mmHg   Bicarbonate 28.9 (H) 20.0 - 28.0 mmol/L   Acid-base deficit 1.1 0.0 - 2.0 mmol/L   O2 Saturation 85.8 %   Patient temperature 36.5     Collection site BLOOD LEFT HAND    Drawn by [id number]     Comment: Performed at [facility], [address] [place], [place] [address]  CBG monitoring, ED     Status: Abnormal   Collection Time: 08/28/23  8:32 AM  Result Value Ref Range   Glucose-Capillary 126 (H) 70 - 99 mg/dL    Comment: Glucose reference range applies only to samples taken after fasting for at least 8 hours.  Troponin I (High Sensitivity)     Status: Abnormal   Collection Time: 08/28/23  8:43 AM  Result Value Ref Range   Troponin I (High Sensitivity) 156 (HH) <18 ng/L    Comment: CRITICAL VALUE NOTED. VALUE IS CONSISTENT WITH PREVIOUSLY REPORTED/CALLED VALUE (NOTE) Elevated high sensitivity troponin I (hsTnI) values and significant  changes across serial measurements may suggest ACS but many other  chronic and acute conditions are known to elevate hsTnI results.  Refer to the "Links" section for chest pain algorithms and additional  guidance. Performed at [facility], [address], [place], [place] [address]   Blood gas, arterial     Status: Abnormal   Collection Time: 08/28/23  8:55 AM  Result Value Ref Range   pH, Arterial 7.24 (L) 7.35 - 7.45   pCO2 arterial 82 (HH) 32 - 48 mmHg    Comment: CRITICAL RESULT CALLED TO, READ BACK BY AND VERIFIED WITH: J NEWMAN AT 0911 ON [id number] BY S DALTON    pO2, Arterial 89 83 - 108 mmHg   Bicarbonate 35.1 (H) 20.0 - 28.0 mmol/L   Acid-Base Excess 4.8 (H) 0.0 - 2.0 mmol/L   O2 Saturation 97 %   Patient temperature 36.9    Collection site LEFT RADIAL    Drawn by [id number]    Allens test (pass/fail) PASS PASS    Comment: Performed at [facility], [address], [place], [place] [address]   DG Chest Port 1 View  Result Date: 08/28/2023 CLINICAL DATA:  Shortness of breath EXAM: PORTABLE CHEST 1 VIEW COMPARISON:  05/21/2023 FINDINGS: Endotracheal tube with tip between the clavicular heads and carina. An enteric tube tip reaches the stomach with side port near the GE junction.  Porta catheter with tip at the SVC. Cardiomegaly and diffuse interstitial opacity with Kerley lines and cephalized blood flow. No focal consolidation, significant effusion, or pneumothorax. IMPRESSION: 1. CHF pattern. 2. Located enteric and endotracheal tubes. Electronically Signed   By: Tiburcio Pea M.D.   On: 08/28/2023 07:42    Pending Labs Unresulted Labs (From admission, onward)     Start     Ordered   08/29/23 0500  Triglycerides  (propofol (DIPRIVAN))  Every 72 hours,   R     Comments: While on propofol (DIPRIVAN)    08/28/23 0724            Vitals/Pain Today's Vitals   08/28/23 1005 08/28/23 1010 08/28/23 1130 08/28/23 1200  BP: 113/75 114/74 103/69 123/81  Pulse:  81 78 76  Resp: 16 17 17 16   Temp: 98.5 F (36.9 C) 98.3 F (36.8 C)    TempSrc:      SpO2:  97% 100% 100%  Weight:      Height:      PainSc:        Isolation Precautions No active isolations  Medications Medications  nitroGLYCERIN 50 mg in dextrose 5 % 250 mL (0.2 mg/mL) infusion (150 mcg/min Intravenous New Bag/Given 08/28/23 1141)  propofol (DIPRIVAN) 1000 MG/100ML infusion (30 mcg/kg/min  73 kg Intravenous New Bag/Given 08/28/23 1142)  midazolam (VERSED) injection 1-2 mg (2 mg Intravenous Given 08/28/23 0838)  fentaNYL in NS (61mcg/ml) infusion-PREMIX (50 mcg/hr Intravenous New Bag/Given 08/28/23 1119)  fentaNYL (SUBLIMAZE) bolus via infusion 50-100 mcg (50 mcg Intravenous Bolus from Bag 08/28/23 1119)  LORazepam (ATIVAN) injection 1 mg (1 mg Intravenous Given 08/28/23 0648)  ondansetron (ZOFRAN) injection 4 mg (4 mg Intravenous Given 08/28/23 0647)  furosemide (LASIX) injection 80 mg (80 mg Intravenous Given 08/28/23 0648)  rocuronium bromide 100 MG/10ML SOSY (70 mg  Given 08/28/23 0711)  etomidate (AMIDATE) 2 MG/ML injection (20 mg  Given 08/28/23 0710)  fentaNYL (SUBLIMAZE) injection 50 mcg (50 mcg Intravenous Given 08/28/23 1002)    Mobility non-ambulatory     Focused  Assessments Cardiac Assessment Handoff:    Lab Results  Component Value Date   CKTOTAL 151 08/24/2021   TROPONINI 0.18 (HH) 03/10/2019   Lab Results  Component Value Date   DDIMER 1.14 (H) 05/31/2022   Does the Patient currently have chest pain? No    R Recommendations: See Admitting Provider Note  Report given to:   Additional Notes:

## 2023-08-28 NOTE — ED Notes (Signed)
Etomidate 20mg  0710 hrs Rocuronium 70 mg 0711 hrs 0713 color change  26 at the lip  8 cm tube

## 2023-08-28 NOTE — ED Provider Notes (Signed)
  Provider Note MRN:  [id number]  Arrival date & time: 08/28/23    ED Course and Medical Decision Making  Assumed care from Dr Clayborne Dana at shift change.  See note from prior team for complete details, in brief:  42 yo male Hx HIV w/ non compliance, HFrEF, cocaine abuse Here w/ resp distress Refused bipap Intubated Acute CHF on exam/imaging/labs NTG gtt, lasix Accepted to MICU at [facility] Will send ED to ED Pending bed  Plan from outgoing team: follow up labs/follow up post intubation, watch until transfer   Ongoing agitation, responding well to fentanyl  BP improving, titrated NTG gtt  Trop elev, favor ischemic demand, continue trend BNP >4,500, continue lasix/ntg BP improving with NTG and adequate sedation Some UOP after lasix Frothy secretions in ETT CXR c/w pulm edema Care link at bedside to transport to [facility]   .Critical Care  Performed by: Sloan Leiter, DO Authorized by: Sloan Leiter, DO   Critical care provider statement:    Critical care time (minutes):  30   Critical care time was exclusive of:  Separately billable procedures and treating other patients   Critical care was necessary to treat or prevent imminent or life-threatening deterioration of the following conditions:  Respiratory failure and cardiac failure   Critical care was time spent personally by me on the following activities:  Development of treatment plan with patient or surrogate, discussions with consultants, evaluation of patient's response to treatment, examination of patient, ordering and review of laboratory studies, ordering and review of radiographic studies, ordering and performing treatments and interventions, pulse oximetry, re-evaluation of patient's condition and review of old charts   Care discussed with: admitting provider     Final Clinical Impressions(s) / ED Diagnoses     ICD-10-CM   1. Acute respiratory failure with hypoxia and hypercapnia (HCC)  J96.01    J96.02     2. Acute  pulmonary edema (HCC)  J81.0     3. AKI (acute kidney injury) (HCC)  N17.9     4. Hypertensive crisis  I16.9     5. NSTEMI (non-ST elevated myocardial infarction) [facility])  I21.4       ED Discharge Orders     None       Discharge Instructions   None        Sloan Leiter, DO 08/28/23 1051

## 2023-08-28 NOTE — Progress Notes (Signed)
Transported pt from ED35 to 2H20 with bedside RN. Vitals are stable.

## 2023-08-28 NOTE — ED Triage Notes (Signed)
Pt arrived via POV from home c/o worsening SOB. Pt reports Hx of CHF and ran out of his 80mg  Lasix 1 day ago. Pt reports Hx of anxiety and presents with active emesis. Pt difficult to get out of vehicle in parking lot and takes multiple efforts to redirect so ED Staff can assist and provide care.

## 2023-08-28 NOTE — H&P (Signed)
NAME:  Richard Cox, MRN:  [id number], DOB:  [birth year], LOS: 0 ADMISSION DATE:  08/28/2023, CONSULTATION DATE: 08/28/2023 REFERRING MD:  Wallace Cullens - ED [facility], CHIEF COMPLAINT: Respiratory failure  History of Present Illness:  42 year old man who presented to ED at [facility] with shortness of breath of acute onset starting a few hours prior to presentation.  No report of associated chest pain, fevers abdominal distention. Prior history of medical noncompliance and cocaine use.  He apparently stated to the emergency doctor that he had not been taking his blood pressure medications or diuretic for the last few days.  No definite history of recent cocaine use.  He was intubated for worsening respiratory distress and given bilateral infiltrates on chest x-ray and elevated BNP he was treated as decompensated heart failure.  He was given diuretics and started on nitroglycerin for severe hypertension with systolics in the 200s.  He has a history of heart failure and and had an CMR consistent with hypertrophic cardiomyopathy and a ejection fraction of approximately 30%.  He has HIV disease with a nadir CD4 count of 184, diagnosed 2020 now on Biktarvy a good response.   He was treated for B-cell lymphoma in the past.  He has had several admissions with similar presentation.  Pertinent  Medical History   Past Medical History:  Diagnosis Date   Acute respiratory failure with hypoxia (HCC) 08/24/2021   Chronic HFrEF (heart failure with reduced ejection fraction) (HCC)    CKD (chronic kidney disease) stage 2, GFR 60-89 ml/min    Endotracheally intubated 08/24/2021   Essential hypertension    HIV infection (HCC)    Lymphoma (HCC) 08/28/2020   Noncompliance with medication regimen    Polysubstance abuse (HCC)    Secondary cardiomyopathy (HCC)      Significant Hospital Events: Including procedures, antibiotic start and stop dates in addition to other pertinent events   10/19-intubated in ED [facility] and  transferred to [facility].  Interim History / Subjective:  Sedated.  Did wake up on arrival in distress.  Objective   Blood pressure 116/88, pulse 81, temperature 98.3 F (36.8 C), resp. rate 19, height 6\' 1"  (1.854 m), weight 73 kg, SpO2 98%.    Vent Mode: PRVC FiO2 (%):  [40 %-100 %] 40 % Set Rate:  [18 bmp] 18 bmp Vt Set:  [630 mL] 630 mL PEEP:  [5 cmH20] 5 cmH20 Plateau Pressure:  [14 cmH20] 14 cmH20   Intake/Output Summary (Last 24 hours) at 08/28/2023 1556 Last data filed at 08/28/2023 1500 Gross per 24 hour  Intake 480.77 ml  Output 150 ml  Net 330.77 ml   Filed Weights   08/28/23 0608  Weight: 73 kg    Examination: General: Average built appearing his stated age. HENT: Orally intubated.  No jaundice. Lungs: Clear to auscultation bilaterally. Cardiovascular: JVP hard to assess.  6 beat is laterally displaced with a double impulse.  S1 normal, widely split S2.  Soft S4, but no murmur appreciated. Abdomen: Soft and nontender Extremities: No peripheral edema Neuro: Currently sedated GU: Foley catheter in place with 700 mL urine output following insertion.  Point-of-care echo shows severe left ventricular hypertrophy, RV hypertrophy.  EF estimated 30%.  No significant valvular lesions.  Small pericardial effusion.  Ancillary test personally reviewed  Chest x-ray shows increased interstitial markings bilaterally. ABG shows respiratory acidosis with pCO2 61 Creatinine elevated at 2.21 with baseline closer to 1.3 BNP elevated at greater than 4500 Negligible troponin rise at 156 Mild  leukocytosis 11.8 hemoglobin normal 14.1 Last CD4 count 580 in July. Urine toxicology is pending Assessment & Plan:   Acute hypoxic and hypercarbic respiratory failure secondary to acute decompensated systolic and diastolic heart failure secondary to hypertrophic cardiomyopathy. Hypertensive emergency HIV disease well-controlled on HART B-cell lymphoma in remission History of cocaine  abuse AKI on CKD stage II  Plan:  -Titrate nitroglycerin IV to keep systolic blood pressure less than 140 -Full ventilatory support using lung protective strategy.  Follow-up ABG. -Diurese to achieve net 1.5-2 L over 24 hours. -Wake up assessment and SBT in a.m. if diuresis is successful. -Continue to monitor urine output and creatinine -Pharmacy will attempt to substitute HIV regimen that can go down OG tube. -Continue current sedation strategy with fentanyl and propofol.  Target RASS -3 to -2  Best Practice (right click and "Reselect all SmartList Selections" daily)   Diet/type: NPO w/ meds via tube DVT prophylaxis: prophylactic heparin  GI prophylaxis: H2B Lines: N/A Foley:  Yes, and it is still needed Code Status:  full code Last date of multidisciplinary goals of care discussion [will update family.]   CRITICAL CARE Performed by: Lynnell Catalan   Total critical care time: 45 minutes  Critical care time was exclusive of separately billable procedures and treating other patients.  Critical care was necessary to treat or prevent imminent or life-threatening deterioration.  Critical care was time spent personally by me on the following activities: development of treatment plan with patient and/or surrogate as well as nursing, discussions with consultants, evaluation of patient's response to treatment, examination of patient, obtaining history from patient or surrogate, ordering and performing treatments and interventions, ordering and review of laboratory studies, ordering and review of radiographic studies, pulse oximetry, re-evaluation of patient's condition and participation in multidisciplinary rounds.  Lynnell Catalan, MD [facility] ICU Physician [facility]  Pager: [phone] Mobile: [phone] After hours: [phone].

## 2023-08-28 NOTE — ED Notes (Signed)
Spoke with EPD about pts BP. EDP stated to reduce dose on propofol, nurse changed rate of propofol.

## 2023-08-28 NOTE — ED Notes (Signed)
Spoke with EPD

## 2023-08-28 NOTE — ED Notes (Signed)
Propofol 20 mg push verbal

## 2023-08-29 DIAGNOSIS — J9601 Acute respiratory failure with hypoxia: Secondary | ICD-10-CM | POA: Diagnosis not present

## 2023-08-29 DIAGNOSIS — Z9911 Dependence on respirator [ventilator] status: Secondary | ICD-10-CM | POA: Diagnosis not present

## 2023-08-29 DIAGNOSIS — J810 Acute pulmonary edema: Secondary | ICD-10-CM

## 2023-08-29 LAB — CBC
HCT: 37.4 % — ABNORMAL LOW (ref 39.0–52.0)
Hemoglobin: 12 g/dL — ABNORMAL LOW (ref 13.0–17.0)
MCH: 30.6 pg (ref 26.0–34.0)
MCHC: 32.1 g/dL (ref 30.0–36.0)
MCV: 95.4 fL (ref 80.0–100.0)
Platelets: 156 10*3/uL (ref 150–400)
RBC: 3.92 MIL/uL — ABNORMAL LOW (ref 4.22–5.81)
RDW: 14.8 % (ref 11.5–15.5)
WBC: 6.5 10*3/uL (ref 4.0–10.5)
nRBC: 0 % (ref 0.0–0.2)

## 2023-08-29 LAB — COMPREHENSIVE METABOLIC PANEL
ALT: 17 U/L (ref 0–44)
AST: 20 U/L (ref 15–41)
Albumin: 3 g/dL — ABNORMAL LOW (ref 3.5–5.0)
Alkaline Phosphatase: 64 U/L (ref 38–126)
Anion gap: 11 (ref 5–15)
BUN: 28 mg/dL — ABNORMAL HIGH (ref 6–20)
CO2: 26 mmol/L (ref 22–32)
Calcium: 8.4 mg/dL — ABNORMAL LOW (ref 8.9–10.3)
Chloride: 108 mmol/L (ref 98–111)
Creatinine, Ser: 1.81 mg/dL — ABNORMAL HIGH (ref 0.61–1.24)
GFR, Estimated: 48 mL/min — ABNORMAL LOW (ref >60)
Glucose, Bld: 76 mg/dL (ref 70–99)
Potassium: 3.8 mmol/L (ref 3.5–5.1)
Sodium: 145 mmol/L (ref 135–145)
Total Bilirubin: 0.8 mg/dL (ref 0.3–1.2)
Total Protein: 6.2 g/dL — ABNORMAL LOW (ref 6.5–8.1)

## 2023-08-29 LAB — TRIGLYCERIDES: Triglycerides: 219 mg/dL — ABNORMAL HIGH (ref <150)

## 2023-08-29 LAB — BRAIN NATRIURETIC PEPTIDE: B Natriuretic Peptide: 2364.9 pg/mL — ABNORMAL HIGH (ref 0.0–100.0)

## 2023-08-29 LAB — HEMOGLOBIN A1C
Hgb A1c MFr Bld: 5.6 % (ref 4.8–5.6)
Mean Plasma Glucose: 114.02 mg/dL

## 2023-08-29 MED ORDER — CLEVIDIPINE BUTYRATE 0.5 MG/ML IV EMUL
0.0000 mg/h | INTRAVENOUS | Status: DC
  Administered 2023-08-29: 2 mg/h via INTRAVENOUS
  Administered 2023-08-29: 3 mg/h via INTRAVENOUS
  Filled 2023-08-29 (×2): qty 100

## 2023-08-29 MED ORDER — DOXYCYCLINE HYCLATE 100 MG PO TABS: 100.0000 mg | ORAL_TABLET | Freq: Two times a day (BID) | ORAL | Status: DC

## 2023-08-29 MED ORDER — SODIUM CHLORIDE 0.9 % IV SOLN
2.0000 g | INTRAVENOUS | Status: DC
  Administered 2023-08-29 – 2023-08-31 (×3): 2 g via INTRAVENOUS
  Filled 2023-08-29 (×3): qty 20

## 2023-08-29 MED ORDER — ONDANSETRON HCL 4 MG/2ML IJ SOLN
4.0000 mg | Freq: Four times a day (QID) | INTRAMUSCULAR | Status: DC | PRN
  Administered 2023-08-29: 4 mg via INTRAVENOUS
  Filled 2023-08-29: qty 2

## 2023-08-29 MED ORDER — DEXMEDETOMIDINE HCL IN NACL 400 MCG/100ML IV SOLN
0.0000 ug/kg/h | INTRAVENOUS | Status: DC
  Administered 2023-08-29: 0.5 ug/kg/h via INTRAVENOUS
  Administered 2023-08-29: 0.2 ug/kg/h via INTRAVENOUS
  Filled 2023-08-29 (×2): qty 100

## 2023-08-29 MED ORDER — DOXYCYCLINE HYCLATE 100 MG PO TABS
100.0000 mg | ORAL_TABLET | Freq: Two times a day (BID) | ORAL | Status: DC
  Administered 2023-08-29 – 2023-08-30 (×3): 100 mg
  Filled 2023-08-29 (×3): qty 1

## 2023-08-29 MED ORDER — ORAL CARE MOUTH RINSE: 15.0000 mL | OROMUCOSAL | Status: DC | PRN

## 2023-08-29 MED ORDER — CARVEDILOL 25 MG PO TABS
25.0000 mg | ORAL_TABLET | Freq: Two times a day (BID) | ORAL | Status: DC
  Administered 2023-08-29 – 2023-08-31 (×5): 25 mg via ORAL
  Filled 2023-08-29 (×5): qty 1

## 2023-08-29 NOTE — Progress Notes (Signed)
Progress Note  Patient Name: Richard Cox Date of Encounter: 08/29/2023  Primary Cardiologist:   Nona Dell, MD   Subjective   He denies pain.  He is intubated but wide awake.  Denies chest pain.  He was upset to find out that he was at [facility] and not [facility].   Inpatient Medications    Scheduled Meds:  bictegravir-emtricitabine-tenofovir AF  1 tablet Oral Daily   Chlorhexidine Gluconate Cloth  6 each Topical Daily   famotidine  20 mg Per Tube BID   furosemide  80 mg Intravenous Q12H   heparin  5,000 Units Subcutaneous Q8H   mouth rinse  15 mL Mouth Rinse Q2H   Continuous Infusions:  fentaNYL infusion INTRAVENOUS 150 mcg/hr (08/29/23 0700)   nitroGLYCERIN Stopped (08/29/23 0147)   propofol (DIPRIVAN) infusion 40 mcg/kg/min (08/29/23 0700)   PRN Meds: docusate, fentaNYL, midazolam, mouth rinse, polyethylene glycol   Vital Signs    Vitals:   08/29/23 0545 08/29/23 0600 08/29/23 0700 08/29/23 0800  BP: 130/77 125/74 119/75   Pulse: 81 81 80   Resp: 18 18 18    Temp:    98.2 F (36.8 C)  TempSrc:    Oral  SpO2: 95% 96% 94%   Weight:      Height:        Intake/Output Summary (Last 24 hours) at 08/29/2023 0957 Last data filed at 08/29/2023 0700 Gross per 24 hour  Intake 1357.48 ml  Output 2390 ml  Net -1032.52 ml   Filed Weights   08/28/23 0608 08/29/23 0500  Weight: 73 kg 70.8 kg    Telemetry    ST, SR, occasional PVCs - Personally Reviewed  ECG    NA - Personally Reviewed  Physical Exam   GEN: No acute distress.   Neck: No  JVD Cardiac: RRR, no murmurs, rubs, or gallops.  Respiratory: Clear  to auscultation bilaterally. GI: Soft, nontender, non-distended  MS: No  edema; No deformity. Neuro:  Nonfocal    Labs    Chemistry Recent Labs  Lab 08/28/23 0702 08/28/23 1333 08/28/23 1652 08/29/23 0401  NA 139 143 143 145  K 4.5 4.3 4.0 3.8  CL 101  --   --  108  CO2 27  --   --  26  GLUCOSE 243*  --   --  76  BUN 30*  --   --  28*   CREATININE 2.21*  --   --  1.81*  CALCIUM 8.4*  --   --  8.4*  PROT 7.6  --   --  6.2*  ALBUMIN 3.8  --   --  3.0*  AST 34  --   --  20  ALT 25  --   --  17  ALKPHOS 81  --   --  64  BILITOT 0.6  --   --  0.8  GFRNONAA 38*  --   --  48*  ANIONGAP 11  --   --  11     Hematology Recent Labs  Lab 08/28/23 0702 08/28/23 1333 08/28/23 1652 08/28/23 1654  WBC 11.8*  --   --  7.2  RBC 4.50  --   --  3.76*  HGB 14.1 12.2* 12.2* 11.7*  HCT 43.8 36.0* 36.0* 35.1*  MCV 97.3  --   --  93.4  MCH 31.3  --   --  31.1  MCHC 32.2  --   --  33.3  RDW 15.4  --   --  15.0  PLT 256  --   --  160    Cardiac EnzymesNo results for input(s): "TROPONINI" in the last 168 hours. No results for input(s): "TROPIPOC" in the last 168 hours.   BNP Recent Labs  Lab 08/28/23 0702 08/29/23 0401  BNP >4,500.0* 2,364.9*     DDimer No results for input(s): "DDIMER" in the last 168 hours.   Radiology    DG Chest Port 1 View  Result Date: 08/28/2023 CLINICAL DATA:  Shortness of breath EXAM: PORTABLE CHEST 1 VIEW COMPARISON:  05/21/2023 FINDINGS: Endotracheal tube with tip between the clavicular heads and carina. An enteric tube tip reaches the stomach with side port near the GE junction. Porta catheter with tip at the SVC. Cardiomegaly and diffuse interstitial opacity with Kerley lines and cephalized blood flow. No focal consolidation, significant effusion, or pneumothorax. IMPRESSION: 1. CHF pattern. 2. Located enteric and endotracheal tubes. Electronically Signed   By: Tiburcio Pea M.D.   On: 08/28/2023 07:42    Cardiac Studies   NA  Patient Profile     42 y.o. male with severe hypertrophic CM.  Non compliance and multiple presentations for acute respiratory failure as a result.    Assessment & Plan    Acute hypoxic respiratory failure:  Likely can be extubated today.   Continue current IV diuresis.    Hypertrophic CM:  We will try again this admission to talk about further work up to  include right heart cath and need for med compliance.  When he is extubated and has had his swallowing assessed he can be resumed on his POs to include hydralazine 50 mg tid, spironolactone 25 mg daily, Coreg 25 mg bid, Imdur 30 mg daily.  Wean IV NTG as tolerated.     For questions or updates, please contact [facility] Please consult [email or web address] for contact info under Cardiology/STEMI.   Signed, Rollene Rotunda, MD  08/29/2023, 9:57 AM

## 2023-08-29 NOTE — Plan of Care (Signed)

## 2023-08-29 NOTE — Progress Notes (Signed)
NAME:  Richard Cox, MRN:  [id number], DOB:  [birth year], LOS: 1 ADMISSION DATE:  08/28/2023, CONSULTATION DATE: 08/28/2023 REFERRING MD:  Wallace Cullens - ED [facility], CHIEF COMPLAINT: Respiratory failure  History of Present Illness:  42 year old man who presented to ED at [facility] with shortness of breath of acute onset starting a few hours prior to presentation.  No report of associated chest pain, fevers abdominal distention. Prior history of medical noncompliance and cocaine use.  He apparently stated to the emergency doctor that he had not been taking his blood pressure medications or diuretic for the last few days.  No definite history of recent cocaine use.  He was intubated for worsening respiratory distress and given bilateral infiltrates on chest x-ray and elevated BNP he was treated as decompensated heart failure.  He was given diuretics and started on nitroglycerin for severe hypertension with systolics in the 200s.  He has a history of heart failure and and had an CMR consistent with hypertrophic cardiomyopathy and a ejection fraction of approximately 30%.  He has HIV disease with a nadir CD4 count of 184, diagnosed 2020 now on Biktarvy a good response.   He was treated for B-cell lymphoma in the past.  He has had several admissions with similar presentation.  Pertinent  Medical History   Past Medical History:  Diagnosis Date   Acute respiratory failure with hypoxia (HCC) 08/24/2021   Chronic HFrEF (heart failure with reduced ejection fraction) (HCC)    CKD (chronic kidney disease) stage 2, GFR 60-89 ml/min    Essential hypertension    HIV infection (HCC)    Hypertrophic cardiomegaly    Lymphoma (HCC) 08/28/2020   Noncompliance with medication regimen    Polysubstance abuse (HCC)    Secondary cardiomyopathy (HCC)      Significant Hospital Events: Including procedures, antibiotic start and stop dates in addition to other pertinent events   10/19-intubated in ED [facility] and  transferred to [facility].  Interim History / Subjective:  Wants to be extubated.   Objective   Blood pressure 119/75, pulse 80, temperature 98.2 F (36.8 C), temperature source Oral, resp. rate 18, height 6\' 1"  (1.854 m), weight 70.8 kg, SpO2 94%.    Vent Mode: PRVC FiO2 (%):  [40 %-100 %] 40 % Set Rate:  [18 bmp] 18 bmp Vt Set:  [630 mL] 630 mL PEEP:  [5 cmH20] 5 cmH20 Plateau Pressure:  [14 cmH20-18 cmH20] 14 cmH20   Intake/Output Summary (Last 24 hours) at 08/29/2023 1035 Last data filed at 08/29/2023 0700 Gross per 24 hour  Intake 1357.48 ml  Output 2390 ml  Net -1032.52 ml   Filed Weights   08/28/23 0608 08/29/23 0500  Weight: 73 kg 70.8 kg    Examination: General: critically ill appearing man lying in bed in NAD, arouses easily HENT: [place]/AT, eyes anicteric. ETT in place.  Lungs: scattered rhonchi, thick secretions, strong cough. Tolerating SBP on 5/5 with Vt ~700 Cardiovascular: s1S2, mild tachycardic.  Abdomen: soft, NT Extremities: no significant edema, appropriate muscle mass Neuro: RASS +1 GU: clear yellow urine.  7.37/53/98/31 BUN 28 Cr 1.81  BNP 2365 WBC 7.2 H/H 11.7/35.1 Platelets 160  UDS: cocaine, benzos  Resolved problem list:   Assessment & Plan:   Acute hypoxic and hypercarbic respiratory failure due to acute pulmonary edema, possibly acute pneumonia -LTVV -VAP prevention protocol -check trach aspirate culture -PAD protocol -SAT & SB: passed, extubate to bipap with history of heart failure. May need precedex to tolerate this -wean  FiO2 as able -start ceftriaxone and doxy; was MRSA negative so no need for vanc currently.  Acute on chronic HFpEF secondary to hypertrophic cardiomyopathy, cocaine abuse probably precipitated this acutely Hypertensive emergency Trop elevation due to hypertensive emergency -additional diuresis; need to be cautious to avoid over-diuresis -adding cleviprex PRN for SBP goal <160 -needs med rec completed > per  cardiology on spiro, hydralazine, coreg, imdur. Will restart coreg today to help get HR down.  HIV disease well-controlled on HART -con't biktarvy  B-cell lymphoma in remission -OP follow up  AKI on CKD stage II -strict I/O -renally dose meds,a void nephrotoxic meds -maintain appropriate perfusion; BP better today  Cocaine abuse -recommend cessation; will counsel to this effect when able  Anemia, chronic, potentially due to HIV -transfuse for Hb <7 or hemodynamically significant bleeding  Hyperglycemia, appears resolved -check A1c   Best Practice (right click and "Reselect all SmartList Selections" daily)   Diet/type: NPO w/ meds via tube> advance as tolerated DVT prophylaxis: prophylactic heparin  GI prophylaxis: H2B Lines: N/A Foley:  removal ordered  Code Status:  full code Last date of multidisciplinary goals of care discussion [will update family.]  This patient is critically ill with multiple organ system failure which requires frequent high complexity decision making, assessment, support, evaluation, and titration of therapies. This was completed through the application of advanced monitoring technologies and extensive interpretation of multiple databases. During this encounter critical care time was devoted to patient care services described in this note for 33 minutes.  Steffanie Dunn, DO 08/29/23 10:50 AM [facility]  For contact information, see Amion. If no response to pager, please call PCCM consult pager. After hours, 7PM- 7AM, please call Elink.

## 2023-08-30 DIAGNOSIS — J810 Acute pulmonary edema: Secondary | ICD-10-CM | POA: Diagnosis not present

## 2023-08-30 DIAGNOSIS — I5043 Acute on chronic combined systolic (congestive) and diastolic (congestive) heart failure: Secondary | ICD-10-CM

## 2023-08-30 DIAGNOSIS — I160 Hypertensive urgency: Secondary | ICD-10-CM

## 2023-08-30 DIAGNOSIS — I422 Other hypertrophic cardiomyopathy: Secondary | ICD-10-CM | POA: Diagnosis not present

## 2023-08-30 DIAGNOSIS — F1410 Cocaine abuse, uncomplicated: Secondary | ICD-10-CM

## 2023-08-30 DIAGNOSIS — Z91199 Patient's noncompliance with other medical treatment and regimen due to unspecified reason: Secondary | ICD-10-CM

## 2023-08-30 DIAGNOSIS — J690 Pneumonitis due to inhalation of food and vomit: Secondary | ICD-10-CM

## 2023-08-30 DIAGNOSIS — F1910 Other psychoactive substance abuse, uncomplicated: Secondary | ICD-10-CM

## 2023-08-30 DIAGNOSIS — J9601 Acute respiratory failure with hypoxia: Secondary | ICD-10-CM | POA: Diagnosis not present

## 2023-08-30 LAB — CBC
HCT: 34 % — ABNORMAL LOW (ref 39.0–52.0)
Hemoglobin: 11.1 g/dL — ABNORMAL LOW (ref 13.0–17.0)
MCH: 30.7 pg (ref 26.0–34.0)
MCHC: 32.6 g/dL (ref 30.0–36.0)
MCV: 94.2 fL (ref 80.0–100.0)
Platelets: 125 10*3/uL — ABNORMAL LOW (ref 150–400)
RBC: 3.61 MIL/uL — ABNORMAL LOW (ref 4.22–5.81)
RDW: 14.4 % (ref 11.5–15.5)
WBC: 4.8 10*3/uL (ref 4.0–10.5)
nRBC: 0 % (ref 0.0–0.2)

## 2023-08-30 LAB — COMPREHENSIVE METABOLIC PANEL
ALT: 18 U/L (ref 0–44)
AST: 20 U/L (ref 15–41)
Albumin: 3.1 g/dL — ABNORMAL LOW (ref 3.5–5.0)
Alkaline Phosphatase: 63 U/L (ref 38–126)
Anion gap: 13 (ref 5–15)
BUN: 26 mg/dL — ABNORMAL HIGH (ref 6–20)
CO2: 29 mmol/L (ref 22–32)
Calcium: 9.3 mg/dL (ref 8.9–10.3)
Chloride: 98 mmol/L (ref 98–111)
Creatinine, Ser: 1.54 mg/dL — ABNORMAL HIGH (ref 0.61–1.24)
GFR, Estimated: 58 mL/min — ABNORMAL LOW (ref >60)
Glucose, Bld: 87 mg/dL (ref 70–99)
Potassium: 3.7 mmol/L (ref 3.5–5.1)
Sodium: 140 mmol/L (ref 135–145)
Total Bilirubin: 0.6 mg/dL (ref 0.3–1.2)
Total Protein: 6.4 g/dL — ABNORMAL LOW (ref 6.5–8.1)

## 2023-08-30 LAB — MAGNESIUM: Magnesium: 2.2 mg/dL (ref 1.7–2.4)

## 2023-08-30 LAB — BRAIN NATRIURETIC PEPTIDE: B Natriuretic Peptide: 792.4 pg/mL — ABNORMAL HIGH (ref 0.0–100.0)

## 2023-08-30 LAB — PHOSPHORUS: Phosphorus: 4.7 mg/dL — ABNORMAL HIGH (ref 2.5–4.6)

## 2023-08-30 MED ORDER — POLYETHYLENE GLYCOL 3350 17 G PO PACK: 17.0000 g | PACK | Freq: Every day | ORAL | Status: DC | PRN

## 2023-08-30 MED ORDER — HYDRALAZINE HCL 25 MG PO TABS
25.0000 mg | ORAL_TABLET | Freq: Three times a day (TID) | ORAL | Status: DC
  Administered 2023-08-30 – 2023-08-31 (×4): 25 mg via ORAL
  Filled 2023-08-30 (×4): qty 1

## 2023-08-30 MED ORDER — FUROSEMIDE 10 MG/ML IJ SOLN
40.0000 mg | Freq: Two times a day (BID) | INTRAMUSCULAR | Status: AC
  Administered 2023-08-30 (×2): 40 mg via INTRAVENOUS
  Filled 2023-08-30 (×2): qty 4

## 2023-08-30 MED ORDER — ISOSORBIDE DINITRATE 10 MG PO TABS
10.0000 mg | ORAL_TABLET | Freq: Three times a day (TID) | ORAL | Status: DC
  Administered 2023-08-30 – 2023-08-31 (×4): 10 mg via ORAL
  Filled 2023-08-30 (×7): qty 1

## 2023-08-30 MED ORDER — DOCUSATE SODIUM 50 MG/5ML PO LIQD: 100.0000 mg | Freq: Two times a day (BID) | ORAL | Status: DC | PRN

## 2023-08-30 MED ORDER — POTASSIUM CHLORIDE CRYS ER 20 MEQ PO TBCR
40.0000 meq | EXTENDED_RELEASE_TABLET | Freq: Once | ORAL | Status: AC
  Administered 2023-08-30: 40 meq via ORAL
  Filled 2023-08-30: qty 2

## 2023-08-30 MED ORDER — DOXYCYCLINE HYCLATE 100 MG PO TABS
100.0000 mg | ORAL_TABLET | Freq: Two times a day (BID) | ORAL | Status: DC
  Administered 2023-08-30 – 2023-08-31 (×2): 100 mg via ORAL
  Filled 2023-08-30 (×2): qty 1

## 2023-08-30 MED ORDER — SPIRONOLACTONE 25 MG PO TABS
25.0000 mg | ORAL_TABLET | Freq: Every day | ORAL | Status: DC
  Administered 2023-08-30 – 2023-08-31 (×2): 25 mg via ORAL
  Filled 2023-08-30 (×2): qty 1

## 2023-08-30 MED ORDER — FUROSEMIDE 10 MG/ML IJ SOLN: 40.0000 mg | Freq: Once | INTRAMUSCULAR | Status: DC

## 2023-08-30 MED ORDER — FAMOTIDINE 20 MG PO TABS
20.0000 mg | ORAL_TABLET | Freq: Two times a day (BID) | ORAL | Status: DC
  Administered 2023-08-30 – 2023-08-31 (×2): 20 mg via ORAL
  Filled 2023-08-30 (×2): qty 1

## 2023-08-30 MED ORDER — FENTANYL 2500MCG IN NS 250ML (10MCG/ML) PREMIX INFUSION
INTRAVENOUS | Status: AC
  Filled 2023-08-30: qty 250

## 2023-08-30 MED ORDER — SODIUM CHLORIDE 0.9% FLUSH
3.0000 mL | Freq: Two times a day (BID) | INTRAVENOUS | Status: DC
  Administered 2023-08-30 – 2023-08-31 (×3): 3 mL

## 2023-08-30 NOTE — Evaluation (Signed)
Occupational Therapy Evaluation Patient Details Name: Richard Cox MRN: [id number] DOB: 01-28-1981 Today's Date: 08/30/2023   History of Present Illness 42 year old man who presented to ED at [facility] with SOB of acute onset, required intubation and was transferred to [place]. PMH: medical noncompliance, cocaine use, HIV, B-cell lymphoma in remission, CHF, bypertrophic cardiomyopathy, EF approx 30%   Clinical Impression   PTA, pt lived alone and reports he was mod I. Upon eval, pt presents with decreased activity tolerance and balance. Pt appears to be at or near baseline. Scoring a 6 on the short blessed test with deficits in delayed memory recall and ability to state months of year in reverse order. Pt states his cognition is at baseline. Pt reports multiple hospitalizations secondary to no PCP follow up to get prescriptions filled rather than missing medications. Have reached out to CM to ensure plan for follow up after discharge.       If plan is discharge home, recommend the following: A little help with walking and/or transfers;A little help with bathing/dressing/bathroom;Assistance with cooking/housework;Help with stairs or ramp for entrance    Functional Status Assessment  Patient has had a recent decline in their functional status and demonstrates the ability to make significant improvements in function in a reasonable and predictable amount of time.  Equipment Recommendations  None recommended by OT    Recommendations for Other Services       Precautions / Restrictions Precautions Precautions: Fall Precaution Comments: watch SpO2      Mobility Bed Mobility               General bed mobility comments: pt received up in chair    Transfers Overall transfer level: Needs assistance Equipment used: None Transfers: Sit to/from Stand Sit to Stand: Supervision           General transfer comment: increased time      Balance Overall balance assessment: Mild  deficits observed, not formally tested                                         ADL either performed or assessed with clinical judgement   ADL Overall ADL's : Needs assistance/impaired Eating/Feeding: Modified independent   Grooming: Supervision/safety;Standing   Upper Body Bathing: Modified independent;Sitting   Lower Body Bathing: Supervison/ safety;Sit to/from stand   Upper Body Dressing : Modified independent;Sitting   Lower Body Dressing: Supervision/safety;Sit to/from stand   Toilet Transfer: Electronics engineer Details (indicate cue type and reason): simulated in room         Functional mobility during ADLs: Contact guard assist       Vision Baseline Vision/History: 0 No visual deficits Ability to See in Adequate Light: 0 Adequate Patient Visual Report: No change from baseline Vision Assessment?: No apparent visual deficits     Perception Perception: Not tested       Praxis Praxis: Not tested       Pertinent Vitals/Pain Pain Assessment Pain Assessment: No/denies pain     Extremity/Trunk Assessment Upper Extremity Assessment Upper Extremity Assessment: Overall WFL for tasks assessed   Lower Extremity Assessment Lower Extremity Assessment: Overall WFL for tasks assessed   Cervical / Trunk Assessment Cervical / Trunk Assessment: Normal   Communication Communication Communication: No apparent difficulties   Cognition Arousal: Alert Behavior During Therapy: Flat affect Overall Cognitive Status: No family/caregiver present to determine baseline cognitive functioning  General Comments: Pt oriented, following one step commands with increased time. Scoring a 6 on the short blessed test with impairments in [facility] and stating months of the year in the reverse order.     General Comments  SPO2 at 88% on RA, declines use of supplemental O2    Exercises     Shoulder  Instructions      Home Living Family/patient expects to be discharged to:: Private residence Living Arrangements: Non-relatives/Friends (roommates, reports parents living next door) Available Help at Discharge: Family;Available PRN/intermittently Type of Home: House Home Access: Stairs to enter [employer] of Steps: 5 Entrance Stairs-Rails: Can reach both Home Layout: One level     Bathroom Shower/Tub: Producer, television/film/video: Standard     Home Equipment: None          Prior Functioning/Environment Prior Level of Function : Independent/Modified Independent             Mobility Comments: drives, no AD, doesn't work, when asked what he does during the day pt states "absolutely nothing" goes to parent's home for breakfast ADLs Comments: indep        OT Problem List: Decreased strength;Impaired balance (sitting and/or standing);Decreased cognition      OT Treatment/Interventions: Self-care/ADL training;Therapeutic exercise;DME and/or AE instruction;Balance training;Patient/family education;Therapeutic activities;Cognitive remediation/compensation    OT Goals(Current goals can be found in the care plan section) Acute Rehab OT Goals Patient Stated Goal: go home OT Goal Formulation: With patient Time For Goal Achievement: 09/13/23 Potential to Achieve Goals: Good  OT Frequency: Min 1X/week    Co-evaluation              AM-PAC OT "6 Clicks" Daily Activity     Outcome Measure Help from another person eating meals?: None Help from another person taking care of personal grooming?: A Little Help from another person toileting, which includes using toliet, bedpan, or urinal?: A Little Help from another person bathing (including washing, rinsing, drying)?: A Little Help from another person to put on and taking off regular upper body clothing?: None Help from another person to put on and taking off regular lower body clothing?: A Little 6 Click  Score: 20   End of Session Equipment Utilized During Treatment: Gait belt Nurse Communication: Mobility status  Activity Tolerance: Patient tolerated treatment well Patient left: in chair;with call bell/phone within reach;with chair alarm set  OT Visit Diagnosis: Unsteadiness on feet (R26.81);Muscle weakness (generalized) (M62.81);Other symptoms and signs involving cognitive function                Time: 0272-5366 OT Time Calculation (min): 22 min Charges:  OT General Charges $OT Visit: 1 Visit OT Evaluation $OT Eval Low Complexity: 1 Low  Richard Cox, OTR/L [facility] Acute Rehabilitation Office: [phone]   Myrla Halsted 08/30/2023, 12:52 PM

## 2023-08-30 NOTE — Progress Notes (Signed)
   Patient Name: Richard Cox Date of Encounter: 08/30/2023 [facility] Cardiologist: Nona Dell, MD   Interval Summary  .    Extubated.  Looks comfortable sitting up in chair.  Has not walked yet.  Vital Signs .    Vitals:   08/30/23 0515 08/30/23 0530 08/30/23 0545 08/30/23 0600  BP: 116/68 116/77 123/81 131/84  Pulse: 67 72 60 60  Resp: 14 16 11 11   Temp:      TempSrc:      SpO2: 95% 96% 97% 97%  Weight:      Height:        Intake/Output Summary (Last 24 hours) at 08/30/2023 0755 Last data filed at 08/30/2023 0600 Gross per 24 hour  Intake 1046.33 ml  Output 2925 ml  Net -1878.67 ml      08/30/2023    5:00 AM 08/29/2023    5:00 AM 08/28/2023    6:08 AM  Last 3 Weights  Weight (lbs) 157 lb 6.5 oz 156 lb 1.4 oz 160 lb 15 oz  Weight (kg) 71.4 kg 70.8 kg 73 kg      Telemetry/ECG    Sinus rhythm, occasional PVCs- Personally Reviewed  Physical Exam .   GEN: No acute distress.   Neck: No JVD Cardiac: RRR, 1/6 aortic systolic murmur, no diastolic murmurs, rubs, or gallops.  Respiratory: Clear to auscultation bilaterally. GI: Soft, nontender, non-distended  MS: No edema  Assessment & Plan .     Blood pressure remains marginally higher than target, but he appears to be clinically euvolemic. BNP is at lowest level since 2020. Creatinine still improving with diuresis. He reports that he was noncompliant since he ran out of medications and did not have an office appointment with a primary provider.  He tells me that he has an appointment scheduled this Thursday with a new PCP. Note that he was a "no-show" or "cancellation by patient" for cardiology appointments 9 times since 2021. Every single time UDS +ve for cocaine (7 times in last 3 years). Right heart cath will have little impact on his management and outcome. The focus should be on cessation of drug use and compliance with regular meds.  Restart hydralazine-nitrates and spironolactone. Continue  IV diuretics, probably just one more day.  CRITICAL CARE Performed by: Rachelle Hora Novak Stgermaine   Total critical care time: 30 minutes  Critical care time was exclusive of separately billable procedures and treating other patients.  Critical care was necessary to treat or prevent imminent or life-threatening deterioration.  Critical care was time spent personally by me on the following activities: development of treatment plan with patient and/or surrogate as well as nursing, discussions with consultants, evaluation of patient's response to treatment, examination of patient, obtaining history from patient or surrogate, ordering and performing treatments and interventions, ordering and review of laboratory studies, ordering and review of radiographic studies, pulse oximetry and re-evaluation of patient's condition.   For questions or updates, please contact [facility] Please consult [email or web address] for contact info under        Signed, Thurmon Fair, MD

## 2023-08-30 NOTE — Progress Notes (Signed)
[facility] ADULT ICU REPLACEMENT PROTOCOL   The patient does apply for the [facility] Adult ICU Electrolyte Replacment Protocol based on the criteria listed below:   1.Exclusion criteria: TCTS, ECMO, Dialysis, and Myasthenia Gravis patients 2. Is GFR >/= 30 ml/min? Yes.    Patient's GFR today is 58 3. Is SCr </= 2? Yes.   Patient's SCr is 1.54 mg/dL 4. Did SCr increase >/= 0.5 in 24 hours? No. 5.Pt's weight >40kg  Yes.   6. Abnormal electrolyte(s): K  7. Electrolytes replaced per protocol 8.  Call MD STAT for K+ </= 2.5, Phos </= 1, or Mag </= 1 Physician:  Thersa Salt Sydney Azure 08/30/2023 4:57 AM

## 2023-08-30 NOTE — Progress Notes (Signed)
NAME:  Richard Cox, MRN:  [id number], DOB:  June 14, 1981, LOS: 2 ADMISSION DATE:  08/28/2023, CONSULTATION DATE: 08/28/2023 REFERRING MD:  Wallace Cullens - ED [facility], CHIEF COMPLAINT: Respiratory failure  History of Present Illness:  42 year old man who presented to ED at [facility] with shortness of breath of acute onset starting a few hours prior to presentation.  No report of associated chest pain, fevers abdominal distention. Prior history of medical noncompliance and cocaine use.  He apparently stated to the emergency doctor that he had not been taking his blood pressure medications or diuretic for the last few days.  No definite history of recent cocaine use.  He was intubated for worsening respiratory distress and given bilateral infiltrates on chest x-ray and elevated BNP he was treated as decompensated heart failure.  He was given diuretics and started on nitroglycerin for severe hypertension with systolics in the 200s.  He has a history of heart failure and and had an CMR consistent with hypertrophic cardiomyopathy and a ejection fraction of approximately 30%.  He has HIV disease with a nadir CD4 count of 184, diagnosed 2020 now on Biktarvy a good response.   He was treated for B-cell lymphoma in the past.  He has had several admissions with similar presentation.  Pertinent  Medical History   Past Medical History:  Diagnosis Date   Acute respiratory failure with hypoxia (HCC) 08/24/2021   Chronic HFrEF (heart failure with reduced ejection fraction) (HCC)    CKD (chronic kidney disease) stage 2, GFR 60-89 ml/min    Essential hypertension    HIV infection (HCC)    Hypertrophic cardiomegaly    Lymphoma (HCC) 08/28/2020   Noncompliance with medication regimen    Polysubstance abuse (HCC)    Secondary cardiomyopathy (HCC)      Significant Hospital Events: Including procedures, antibiotic start and stop dates in addition to other pertinent events   10/19-intubated in ED [facility] and  transferred to [facility]. 10/20 extubated to BiPAP 10/21 transfer out of ICU  Interim History / Subjective:  Tolerated extubation well. Ambulated around hallway with PT, did well. Did have slight hypoxia after, currently at 88% on room air.  Objective   Blood pressure (!) 149/96, pulse 65, temperature 97.6 F (36.4 C), temperature source Axillary, resp. rate 15, height 6\' 1"  (1.854 m), weight 71.4 kg, SpO2 90%.    Vent Mode: BIPAP;PSV FiO2 (%):  [40 %] 40 % Set Rate:  [18 bmp] 18 bmp Vt Set:  [630 mL] 630 mL PEEP:  [5 cmH20] 5 cmH20 Pressure Support:  [10 cmH20] 10 cmH20 Plateau Pressure:  [14 cmH20] 14 cmH20   Intake/Output Summary (Last 24 hours) at 08/30/2023 1004 Last data filed at 08/30/2023 0800 Gross per 24 hour  Intake 948.06 ml  Output 3175 ml  Net -2226.94 ml   Filed Weights   08/28/23 0608 08/29/23 0500 08/30/23 0500  Weight: 73 kg 70.8 kg 71.4 kg    Examination: General: Adult male, sitting up in chair, in NAD. Neuro: A&O x 3, no deficits. HEENT: [place]/AT. Sclerae anicteric. EOMI. Cardiovascular: RRR, no M/R/G.  Lungs: Respirations even and unlabored.  CTA bilaterally, No W/R/R. Abdomen: BS x 4, soft, NT/ND.  Musculoskeletal: No gross deformities, no edema.  Skin: Intact, warm, no rashes.   Assessment & Plan:   Acute hypoxic and hypercarbic respiratory failure due to acute pulmonary edema, possibly acute pneumonia - s/p intubation in ED with extubation 10/20 - Continue Lasix, decrease from 80mg  IV BID to 40mg  IV  x 2 now then transition back to PO dosing 10/22 (on 80mg  PO at home) - Continue Ceftriaxone and Doxy for now, transition to Augmentin on d/c for 5 - 7 days total abx - Mobilize - Bronchial hygiene  Acute on chronic HFpEF secondary to hypertrophic cardiomyopathy, cocaine abuse probably precipitated this acutely Hypertensive emergency Trop elevation due to hypertensive emergency - Continue Lasix, decrease from 80mg  IV BID to 40mg  IV x 2 now then  transition back to PO dosing 10/22 (on 80mg  PO at home) - Continue PTA spiro, hydralazine, coreg, imdur  HIV disease well-controlled on HART -con't biktarvy  B-cell lymphoma in remission -OP follow up  AKI on CKD stage II -strict I/O -renally dose meds, avoid nephrotoxic meds -follow BMP  Polysubstance abuse - UDS positive for Cocaine + Benzos -counseling/recommend cessation  Anemia, chronic, potentially due to HIV -transfuse for Hb <7 or hemodynamically significant bleeding  Hyperglycemia, appears resolved -check A1c  Hx non-compliance - has hx of missing PCP appointments, running of out of meds, missing cardiology appts (no show or cancel by pt 9 times in last 3 years) - Counseling   Stable for transfer to [facility]. Will ask [facility] to assume care in AM 10/22 with PCCM off.   Best Practice (right click and "Reselect all SmartList Selections" daily)   Diet/type: Regular consistency (see orders) and NPO w/ meds via tube DVT prophylaxis: prophylactic heparin  GI prophylaxis: H2B Lines: N/A Foley:  N/A Code Status:  full code Last date of multidisciplinary goals of care discussion [will update family.]   Rutherford Guys, PA - [facility] For pager details, please see AMION or use Epic chat  After 1900, please call ELINK for cross coverage needs 08/30/2023, 10:16 AM

## 2023-08-30 NOTE — Evaluation (Signed)
Physical Therapy Evaluation Patient Details Name: Richard Cox MRN: [id number] DOB: 08/03/1981 Today's Date: 08/30/2023  History of Present Illness  42 year old man who presented to ED at [facility] with SOB of acute onset, required intubation and was transferred to [facility]. PMH: medical noncompliance, cocaine use, HIV, B-cell lymphoma in remission, CHF, bypertrophic cardiomyopathy, EF approx 30%   Clinical Impression  Pt admitted with above. Pt near baseline. Pt with mild balance deficits, decreased activity tolerance, and noted decreased SpO2 into 80s on RA. I don't anticipate pt to need follow up PT upon d/c however acute PT to follow while in hospital.         If plan is discharge home, recommend the following:     Can travel by private vehicle        Equipment Recommendations None recommended by PT  Recommendations for Other Services       Functional Status Assessment Patient has had a recent decline in their functional status and demonstrates the ability to make significant improvements in function in a reasonable and predictable amount of time.     Precautions / Restrictions Precautions Precautions: Fall Precaution Comments: watch SpO2      Mobility  Bed Mobility               General bed mobility comments: pt received up in chair    Transfers Overall transfer level: Needs assistance Equipment used: None Transfers: Sit to/from Stand Sit to Stand: Supervision           General transfer comment: no difficulty, increased time, guarded, no physical assist    Ambulation/Gait Ambulation/Gait assistance: Contact guard assist Gait Distance (Feet): 370 Feet Assistive device: None Gait Pattern/deviations: Step-through pattern, Decreased stride length, Trunk flexed Gait velocity: slow Gait velocity interpretation: <1.31 ft/sec, indicative of household ambulator   General Gait Details: pt with trunk and neck flexion requiring verbal cues from PT to stand up  right and look ahead, pt states "I always look down when I walk". Pt with decreased pace, mild SOB, SPO2 at 88% on RA, noted coughing, short step length, with onset of fatigue near shuffling pattern  Stairs            Wheelchair Mobility     Tilt Bed    Modified Rankin (Stroke Patients Only)       Balance Overall balance assessment: Mild deficits observed, not formally tested                                           Pertinent Vitals/Pain Pain Assessment Pain Assessment: No/denies pain    Home Living Family/patient expects to be discharged to:: Private residence Living Arrangements: Non-relatives/Friends (roommates, reports parents living next door) Available Help at Discharge: Family;Available PRN/intermittently Type of Home: House Home Access: Stairs to enter Entrance Stairs-Rails: Can reach both Entrance Stairs-Number of Steps: 5   Home Layout: One level Home Equipment: None      Prior Function Prior Level of Function : Independent/Modified Independent             Mobility Comments: drives, no AD, doesn't work, when asked what he does during the day pt states "absolutely nothing" goes to parent's home for breakfast ADLs Comments: indep     Extremity/Trunk Assessment   Upper Extremity Assessment Upper Extremity Assessment: Overall WFL for tasks assessed    Lower Extremity Assessment Lower Extremity Assessment: Overall  WFL for tasks assessed    Cervical / Trunk Assessment Cervical / Trunk Assessment: Normal  Communication   Communication Communication: No apparent difficulties  Cognition Arousal: Alert Behavior During Therapy: Flat affect Overall Cognitive Status: No family/caregiver present to determine baseline cognitive functioning                                 General Comments: pt with some difficulty following 1 step commands for LE MMT, pt re-oriented to date (stated 10/19), new he was [facility]         General Comments General comments (skin integrity, edema, etc.): SPO2 at 88% on RA, declines use of supplemental O2    Exercises     Assessment/Plan    PT Assessment Patient needs continued PT services  PT Problem List Decreased strength;Decreased activity tolerance;Decreased balance;Decreased mobility;Decreased coordination       PT Treatment Interventions Gait training;Stair training;Functional mobility training;Therapeutic activities;Therapeutic exercise;Balance training    PT Goals (Current goals can be found in the Care Plan section)  Acute Rehab PT Goals Patient Stated Goal: didn't state PT Goal Formulation: With patient Time For Goal Achievement: 09/13/23 Potential to Achieve Goals: Good Additional Goals Additional Goal #1: Pt to score > 19 on DGI to indicate minimal falls risk.    Frequency Min 1X/week     Co-evaluation               AM-PAC PT "6 Clicks" Mobility  Outcome Measure Help needed turning from your back to your side while in a flat bed without using bedrails?: None Help needed moving from lying on your back to sitting on the side of a flat bed without using bedrails?: None Help needed moving to and from a bed to a chair (including a wheelchair)?: None Help needed standing up from a chair using your arms (e.g., wheelchair or bedside chair)?: None Help needed to walk in hospital room?: A Little Help needed climbing 3-5 steps with a railing? : A Little 6 Click Score: 22    End of Session Equipment Utilized During Treatment: Gait belt Activity Tolerance: Patient tolerated treatment well Patient left: in chair;with call bell/phone within reach Nurse Communication: Mobility status PT Visit Diagnosis: Unsteadiness on feet (R26.81);Muscle weakness (generalized) (M62.81)    Time: 9147-8295 PT Time Calculation (min) (ACUTE ONLY): 19 min   Charges:   PT Evaluation $PT Eval Low Complexity: 1 Low   PT General Charges $$ ACUTE PT VISIT: 1 Visit          Lewis Shock, PT, DPT Acute Rehabilitation Services Secure chat preferred Office #: [phone]   Iona Hansen 08/30/2023, 12:30 PM

## 2023-08-30 NOTE — Progress Notes (Signed)
Heart Failure Navigator Progress Note  Assessed for Heart & Vascular TOC clinic readiness.  Patient does not meet criteria due to Advanced Heart Failure Team Patient of Dr. Shirlee Latch.  Navigator will sign off at this time.   Roxy Horseman, RN, BSN [facility] Heart Failure Navigator Secure Chat Only

## 2023-08-30 NOTE — TOC Initial Note (Signed)
Transition of Care [facility]) - Initial/Assessment Note    Patient Details  Name: Richard Cox MRN: [id number] Date of Birth: Jul 15, 1981  Transition of Care [facility]) CM/SW Contact:    Ronny Bacon, RN Phone Number: 08/30/2023, 1:26 PM  Clinical Narrative:   Spoke with patient at bedside. Patient lives in single family home with roommates.  Patient did not have PCP, but has one now and already has appointment made for 09/01/2023 at [facility].  Patient also is being followed by [facility] with next appointment in [place]. Patient confirms that he has transportation to doctor appointments and to his pharmacy. Patient is out of home medications and would need refills if discharged prior to establishing care with new PCP. Patient does not have and DME at home and has not used [facility] services in the past.             Expected Discharge Plan: Home/Self Care Barriers to Discharge: Continued Medical Work up   Patient Goals and CMS Choice Patient states their goals for this hospitalization and ongoing recovery are:: To get PCP so that he can stay compliant with prescribed home meds          Expected Discharge Plan and Services       Living arrangements for the past 2 months: Single Family Home                                      Prior Living Arrangements/Services Living arrangements for the past 2 months: Single Family Home Lives with:: Roommate Patient language and need for interpreter reviewed:: Yes Do you feel safe going back to the place where you live?: Yes      Need for Family Participation in Patient Care: Yes (Comment) Care giver support system in place?: Yes (comment)   Criminal Activity/Legal Involvement Pertinent to Current Situation/Hospitalization: No - Comment as needed  Activities of Daily Living      Permission Sought/Granted Permission sought to share information with : Family Supports, Case Manager Permission granted to share information with : Yes,  Verbal Permission Granted  Share Information with NAME: Kodiak Griffin     Permission granted to share info w Relationship: Mom     Emotional Assessment Appearance:: Appears older than stated age, Disheveled Attitude/Demeanor/Rapport: Engaged Affect (typically observed): Appropriate Orientation: : Oriented to Self, Oriented to Place, Oriented to  Time, Oriented to Situation Alcohol / Substance Use: Not Applicable Psych Involvement: No (comment)  Admission diagnosis:  Respiratory failure (HCC) [J96.90] Hypertensive crisis [I16.9] Acute pulmonary edema (HCC) [J81.0] NSTEMI (non-ST elevated myocardial infarction) (HCC) [I21.4] AKI (acute kidney injury) (HCC) [N17.9] Acute respiratory failure with hypoxia and hypercapnia (HCC) [J96.01, J96.02] CHF (congestive heart failure) (HCC) [I50.9] Patient Active Problem List   Diagnosis Date Noted   On mechanically assisted ventilation (HCC) 08/29/2023   Acute pulmonary edema (HCC) 08/29/2023   Respiratory failure (HCC) 08/28/2023   Hypoxia 05/21/2023   Acute respiratory failure (HCC) 05/21/2023   Poor social situation 05/19/2023   CHF (congestive heart failure) (HCC) 09/04/2022   Cocaine abuse (HCC) 09/04/2022   Hypertensive crisis 08/13/2022   Healthcare maintenance 10/17/2021   Acute respiratory failure with hypoxia (HCC) 08/24/2021   Elevated troponin 08/24/2021   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   Intra-abdominal lymphadenopathy 07/30/2020   Generalized lymphadenopathy 07/25/2020   Varicella zoster 01/04/2020   AIDS (acquired immune deficiency syndrome) (HCC) 10/10/2019   Acute  on chronic combined systolic and diastolic CHF (congestive heart failure) (HCC) 09/18/2019   HIV (human immunodeficiency virus infection) (HCC) 09/18/2019   Substance abuse (HCC) 09/18/2019   Alcohol abuse 09/18/2019   Tobacco abuse 09/18/2019   HTN (hypertension) 03/10/2019   Noncompliance with medication regimen 03/10/2019   PCP:  Pcp,  No Pharmacy:   [id number] - [place], [place] - [address] [place] [address] Phone: [phone] Fax: [phone]  [facility][id number] [place], [place] - [address] AT [facility] OF [address] [place] [address] Phone: [phone] Fax: [phone]  [facility] [address] [place] [address] Phone: [phone] Fax: [phone]  [facility] [phone] @ [address] [place], [place] - [address] [place] [address] Phone: [phone] Fax: [phone]  [facility] #[id number] - [place], [place] - [address] AT [facility] OF [address] [place] [address] Phone: [phone] Fax: [phone]     Social Determinants of Health (SDOH) Social History: SDOH Screenings   Food Insecurity: No Food Insecurity (09/05/2022)  Housing: Low Risk  (02/07/2023)  Transportation Needs: No Transportation Needs (09/05/2022)  Utilities: Not At Risk (09/05/2022)  Alcohol Screen: Low Risk  (10/14/2020)  Depression (PHQ2-9): Low Risk  (05/19/2023)  Financial Resource Strain: Low Risk  (09/10/2020)  Physical Activity: Insufficiently Active (09/10/2020)  Social Connections: Socially Isolated (09/10/2020)  Stress: No Stress Concern Present (09/10/2020)  Tobacco Use: Medium Risk (08/28/2023)   SDOH Interventions:     Readmission Risk Interventions    08/31/2022    8:43 AM 08/27/2021    3:31 PM  Readmission Risk Prevention Plan  Transportation Screening Complete Complete  Medication Review (RN Care Manager) Complete   PCP or Specialist appointment within 3-5 days of discharge  Not Complete  PCP/Specialist Appt Not Complete comments  Left message with Dr Allena Katz, waiting for call back.  HRI or Home Care Consult Complete Complete  SW Recovery Care/Counseling Consult Complete Complete   Palliative Care Screening Not Applicable Not Applicable  Skilled Nursing Facility Not Applicable Not Applicable

## 2023-08-31 ENCOUNTER — Other Ambulatory Visit (HOSPITAL_COMMUNITY): Payer: Self-pay

## 2023-08-31 ENCOUNTER — Telehealth: Payer: Self-pay | Admitting: Cardiology

## 2023-08-31 DIAGNOSIS — I428 Other cardiomyopathies: Secondary | ICD-10-CM | POA: Diagnosis not present

## 2023-08-31 DIAGNOSIS — I5043 Acute on chronic combined systolic (congestive) and diastolic (congestive) heart failure: Secondary | ICD-10-CM | POA: Diagnosis not present

## 2023-08-31 DIAGNOSIS — J810 Acute pulmonary edema: Secondary | ICD-10-CM | POA: Diagnosis not present

## 2023-08-31 DIAGNOSIS — J189 Pneumonia, unspecified organism: Secondary | ICD-10-CM

## 2023-08-31 DIAGNOSIS — I5023 Acute on chronic systolic (congestive) heart failure: Secondary | ICD-10-CM

## 2023-08-31 DIAGNOSIS — J9601 Acute respiratory failure with hypoxia: Secondary | ICD-10-CM | POA: Diagnosis not present

## 2023-08-31 LAB — CBC
HCT: 37.1 % — ABNORMAL LOW (ref 39.0–52.0)
Hemoglobin: 12.3 g/dL — ABNORMAL LOW (ref 13.0–17.0)
MCH: 30.5 pg (ref 26.0–34.0)
MCHC: 33.2 g/dL (ref 30.0–36.0)
MCV: 92.1 fL (ref 80.0–100.0)
Platelets: 134 10*3/uL — ABNORMAL LOW (ref 150–400)
RBC: 4.03 MIL/uL — ABNORMAL LOW (ref 4.22–5.81)
RDW: 14.4 % (ref 11.5–15.5)
WBC: 4.2 10*3/uL (ref 4.0–10.5)
nRBC: 0 % (ref 0.0–0.2)

## 2023-08-31 LAB — COMPREHENSIVE METABOLIC PANEL
ALT: 22 U/L (ref 0–44)
AST: 24 U/L (ref 15–41)
Albumin: 3.2 g/dL — ABNORMAL LOW (ref 3.5–5.0)
Alkaline Phosphatase: 65 U/L (ref 38–126)
Anion gap: 16 — ABNORMAL HIGH (ref 5–15)
BUN: 23 mg/dL — ABNORMAL HIGH (ref 6–20)
CO2: 26 mmol/L (ref 22–32)
Calcium: 9.9 mg/dL (ref 8.9–10.3)
Chloride: 97 mmol/L — ABNORMAL LOW (ref 98–111)
Creatinine, Ser: 1.46 mg/dL — ABNORMAL HIGH (ref 0.61–1.24)
GFR, Estimated: >60 mL/min (ref >60)
Glucose, Bld: 181 mg/dL — ABNORMAL HIGH (ref 70–99)
Potassium: 3.4 mmol/L — ABNORMAL LOW (ref 3.5–5.1)
Sodium: 139 mmol/L (ref 135–145)
Total Bilirubin: 0.6 mg/dL (ref 0.3–1.2)
Total Protein: 6.8 g/dL (ref 6.5–8.1)

## 2023-08-31 LAB — CULTURE, RESPIRATORY W GRAM STAIN

## 2023-08-31 LAB — BRAIN NATRIURETIC PEPTIDE: B Natriuretic Peptide: 2258.1 pg/mL — ABNORMAL HIGH (ref 0.0–100.0)

## 2023-08-31 MED ORDER — ACETAMINOPHEN 325 MG PO TABS
650.0000 mg | ORAL_TABLET | Freq: Four times a day (QID) | ORAL | Status: DC | PRN
  Administered 2023-08-31: 650 mg via ORAL
  Filled 2023-08-31: qty 2

## 2023-08-31 MED ORDER — ASPIRIN 81 MG PO CHEW: 81.0000 mg | CHEWABLE_TABLET | Freq: Every day | ORAL | 0 refills | Status: DC

## 2023-08-31 MED ORDER — HYDRALAZINE HCL 50 MG PO TABS: 50.0000 mg | ORAL_TABLET | Freq: Three times a day (TID) | ORAL | Status: DC

## 2023-08-31 MED ORDER — SPIRONOLACTONE 25 MG PO TABS
25.0000 mg | ORAL_TABLET | Freq: Every day | ORAL | 0 refills | Status: DC
  Filled 2023-08-31: qty 90, 90d supply, fill #0

## 2023-08-31 MED ORDER — BIKTARVY 50-200-25 MG PO TABS
1.0000 | ORAL_TABLET | Freq: Every day | ORAL | 0 refills | Status: AC
  Filled 2023-08-31: qty 90, 90d supply, fill #0

## 2023-08-31 MED ORDER — CARVEDILOL 25 MG PO TABS
25.0000 mg | ORAL_TABLET | Freq: Two times a day (BID) | ORAL | 0 refills | Status: DC
  Filled 2023-08-31: qty 180, 90d supply, fill #0

## 2023-08-31 MED ORDER — FUROSEMIDE 40 MG PO TABS
80.0000 mg | ORAL_TABLET | Freq: Every day | ORAL | Status: DC
  Administered 2023-08-31: 80 mg via ORAL
  Filled 2023-08-31: qty 2

## 2023-08-31 MED ORDER — SACUBITRIL-VALSARTAN 24-26 MG PO TABS
1.0000 | ORAL_TABLET | Freq: Two times a day (BID) | ORAL | 0 refills | Status: AC
  Filled 2023-08-31: qty 180, 90d supply, fill #0

## 2023-08-31 MED ORDER — DOXYCYCLINE HYCLATE 100 MG PO TABS
100.0000 mg | ORAL_TABLET | Freq: Two times a day (BID) | ORAL | 0 refills | Status: AC
  Filled 2023-08-31: qty 10, 5d supply, fill #0

## 2023-08-31 MED ORDER — HYDROMORPHONE HCL 1 MG/ML IJ SOLN
0.5000 mg | Freq: Once | INTRAMUSCULAR | Status: AC
  Administered 2023-08-31: 0.5 mg via INTRAVENOUS
  Filled 2023-08-31: qty 0.5

## 2023-08-31 MED ORDER — FUROSEMIDE 80 MG PO TABS
80.0000 mg | ORAL_TABLET | Freq: Every day | ORAL | 0 refills | Status: DC
  Filled 2023-08-31: qty 90, 90d supply, fill #0

## 2023-08-31 MED ORDER — SACUBITRIL-VALSARTAN 24-26 MG PO TABS
1.0000 | ORAL_TABLET | Freq: Two times a day (BID) | ORAL | Status: DC
  Administered 2023-08-31: 1 via ORAL
  Filled 2023-08-31: qty 1

## 2023-08-31 MED ORDER — HYDRALAZINE HCL 25 MG PO TABS
50.0000 mg | ORAL_TABLET | Freq: Three times a day (TID) | ORAL | 0 refills | Status: DC
  Filled 2023-08-31: qty 180, 30d supply, fill #0

## 2023-08-31 MED ORDER — CEFDINIR 300 MG PO CAPS
300.0000 mg | ORAL_CAPSULE | Freq: Two times a day (BID) | ORAL | 0 refills | Status: AC
Start: 2023-08-31 — End: 2023-09-05
  Filled 2023-08-31: qty 10, 5d supply, fill #0

## 2023-08-31 MED ORDER — HEPARIN SOD (PORK) LOCK FLUSH 100 UNIT/ML IV SOLN
500.0000 [IU] | INTRAVENOUS | Status: AC | PRN
  Administered 2023-08-31: 500 [IU]
  Filled 2023-08-31: qty 5

## 2023-08-31 MED ORDER — ISOSORBIDE DINITRATE 10 MG PO TABS
10.0000 mg | ORAL_TABLET | Freq: Three times a day (TID) | ORAL | 0 refills | Status: DC
  Filled 2023-08-31: qty 90, 30d supply, fill #0

## 2023-08-31 NOTE — Discharge Summary (Signed)
Physician Discharge Summary  Richard Cox GEX:[id number] DOB: May 17, 1981 DOA: 08/28/2023  PCP: Pcp, No  Admit date: 08/28/2023 Discharge date: 08/31/2023  Admitted From: Home Disposition: Home  Recommendations for Outpatient Follow-up:  Follow up with PCP in 1-2 weeks; appointment scheduled on 09/01/2023 Follow-up with cardiology outpatient Please obtain BMP in 1 week to reassess renal function Continue to encourage cessation of illicit substances, cocaine  Home Health: No Equipment/Devices: None  Discharge Condition: Stable CODE STATUS: Full code Diet recommendation: Heart healthy diet  History of present illness:  Richard Cox is a 42 year old male with past medical history significant for hypertrophic cardiomyopathy, chronic systolic congestive heart failure, HTN, HIV, history of B-cell lymphoma, CKD stage II, polysubstance abuse with cocaine, medical noncompliance who presented to [facility] ED on 10/19 with acute shortness of breath.  Patient reported to ED physician has not been taking his medication for last few days due to being "out".  In the ED, temperature 97.7 F, HR 135, RR 32, BP 233/165, SpO2 upper 60s per EDP, WBC 11.8, hemoglobin 14.1, platelet count 256.  Sodium 139, potassium 4.5, chloride 101, CO2 27, glucose 243, BUN 30, creatinine 2.21.  AST 34, ALT 25, total bilirubin 0.6.  BNP greater than 4500.  High sensitive troponin 147 followed by 156.  Lactic acid 0.7.  UDS positive for benzodiazepines, cocaine.  Chest x-ray with cardiomegaly, diffuse interstitial opacities with curly lines and cephalized blood flow consistent with pulmonary edema.  Given patient's hypoxia, respiratory distress, patient was intubated and placed on ventilatory support by the ED physician.  Patient was started on IV diuresis, IV nitroglycerin drip for severe hypertension.  Patient was transferred to [facility] under the critical care service.  Significant Hospital events: 10/19:  Presented to [facility], ED, intubated and placed on ventilatory support, IV diuresis, IV nitroglycerin, transferred to [facility] under PCCM service; cardiology consulted 10/20: Extubated to BiPAP 10/22: Transferred to the hospital service, titrated off of oxygen, cardiology signed off, discharging home  Hospital course:  Acute hypoxic, hypercarbic respiratory failure Acute pulmonary edema Community-acquired pneumonia Patient presenting to the ED with acute shortness of breath in the setting of nonadherence to his home medications.  Patient was afebrile with a WBC count of 11.8.  BNP greater than 4500.  Chest x-ray with cardiomegaly, diffuse interstitial opacities with curly lines and cephalized blood flow consistent with pulmonary edema.  Given his resp distress, tachypnea, hypoxia and hypercarbia patient was intubated by the ED physician and placed on ventilatory support.  Patient was transferred to [facility] under the critical care service and treated with IV diuresis with improvement.  Patient was successfully extubated on 08/29/2023 to BiPAP.  Further diuresis patient was able to wean off of supplemental oxygen.  Patient was also treated for concern of pneumonia, will continue antibiotics on discharge to complete 7-day course with doxycycline and cefdinir.  Acute on chronic systolic congestive heart failure History of hypertrophic cardiomyopathy Hypertensive emergency Elevated troponin 2/2 hypertensive emergency Patient presenting to ED with acute shortness of breath.  Was noted to be severely hypertensive with blood pressure 233/165, hypoxic with SpO2 in the upper 60s per EDP and tachypneic with elevated BNP greater than 4500.  High sensitivity troponin elevated 147 followed by 156, likely secondary to type II demand ischemia in the setting of hypertensive emergency and respiratory failure.  Chest x-ray consistent with pulmonary edema.  Patient was placed on ventilatory support and started  on IV diuresis and IV nitroglycerin drip.  Patient  was transferred to [facility] under the critical care service.  Cardiology was consulted and followed during hospital course.  Patient was successfully extubated and weaned from supplemental oxygen.  Continue furosemide 80 mg p.o. daily, carvedilol 25 mg p.o. twice daily, hydralazine 50 mg p.o. every 8 hours, Entresto 24-26 mg p.o. twice daily, Aldactone 25 mg p.o. daily, isosorbide dinitrate 10 mg p.o. 3 times daily.  Continue to encourage compliance with medications.  Outpatient follow-up with cardiology.  Renal failure on CKD stage II Creatinine elevated 2.21 on admission, likely secondary to medical noncompliance with his home medications.  Patient was started on IV diuresis with improvement of symptoms.  Creatinine improved to 1.46 at time of discharge.  Recommend repeat BMP 1 week.  HIV Continue Biktarvy.  Outpatient follow-up with infectious disease  Cell lymphoma in remission Outpatient follow-up with medical oncology  Polysubstance use UDS positive for benzodiazepines, cocaine.  Has longstanding history of cocaine abuse.  Discussed with patient needs complete cessation/abstinence as this complicates/hinders his medical care.  Continue to encourage abstinence/cessation.  Discharge Diagnoses:  Principal Problem:   Respiratory failure (HCC) Active Problems:   CHF (congestive heart failure) (HCC)   On mechanically assisted ventilation (HCC)   Acute pulmonary edema [facility])    Discharge Instructions  Discharge Instructions     Call MD for:  difficulty breathing, headache or visual disturbances   Complete by: As directed    Call MD for:  extreme fatigue   Complete by: As directed    Call MD for:  persistant dizziness or light-headedness   Complete by: As directed    Call MD for:  persistant nausea and vomiting   Complete by: As directed    Call MD for:  severe uncontrolled pain   Complete by: As directed    Call MD for:   temperature >100.4   Complete by: As directed    Diet - low sodium heart healthy   Complete by: As directed    Increase activity slowly   Complete by: As directed       Allergies as of 08/31/2023       Reactions   Motrin [ibuprofen] Hypertension   Neosporin [neomycin-bacitracin Zn-polymyx] Rash        Medication List     STOP taking these medications    isosorbide mononitrate 30 MG 24 hr tablet Commonly known as: IMDUR   losartan 50 MG tablet Commonly known as: COZAAR       TAKE these medications    acetaminophen 500 MG tablet Commonly known as: TYLENOL Take 1,500 mg by mouth as needed for moderate pain (pain score 4-6), headache or mild pain (pain score 1-3).   aspirin 81 MG chewable tablet Chew 1 tablet (81 mg total) by mouth daily.   Biktarvy 50-200-25 MG Tabs tablet Generic drug: bictegravir-emtricitabine-tenofovir AF Take 1 tablet by mouth daily.   carvedilol 25 MG tablet Commonly known as: COREG Take 1 tablet (25 mg total) by mouth 2 (two) times daily with a meal.   cefdinir 300 MG capsule Commonly known as: OMNICEF Take 1 capsule (300 mg total) by mouth 2 (two) times daily for 5 days.   doxycycline 100 MG tablet Commonly known as: VIBRA-TABS Take 1 tablet (100 mg total) by mouth every 12 (twelve) hours for 5 days.   famotidine 20 MG tablet Commonly known as: PEPCID Take 1 tablet (20 mg total) by mouth daily.   furosemide 80 MG tablet Commonly known as: LASIX Take 1 tablet (80  mg total) by mouth daily. What changed:  how much to take when to take this   hydrALAZINE 50 MG tablet Commonly known as: APRESOLINE Take 1 tablet (50 mg total) by mouth every 8 (eight) hours. What changed:  medication strength when to take this   isosorbide dinitrate 10 MG tablet Commonly known as: ISORDIL Take 1 tablet (10 mg total) by mouth 3 (three) times daily.   sacubitril-valsartan 24-26 MG Commonly known as: ENTRESTO Take 1 tablet by mouth 2 (two)  times daily.   spironolactone 25 MG tablet Commonly known as: ALDACTONE Take 1 tablet (25 mg total) by mouth daily. Start taking on: September 01, 2023 What changed: how much to take        Follow-up Information     [facility] Follow up.   Specialty: Cardiology Why: Somebody from our office will call you to schedule an appointment.  If you do not get a phone call 1 to 2 days after discharge please call our office to schedule appointment. Contact information: [address] [place] [address] [phone]        [facility] Follow up on 09/01/2023.   Why: New Patient Visit with Konrad Dolores Polanco scheduled for 09/01/2023 at 2:40 pm Contact information: [address] [place] [address] [phone]               Allergies  Allergen Reactions   Motrin [Ibuprofen] Hypertension   Neosporin [Neomycin-Bacitracin Zn-Polymyx] Rash    Consultations: PCCM Cardiology   Procedures/Studies: [facility] Chest Port 1 View  Result Date: 08/28/2023 CLINICAL DATA:  Shortness of breath EXAM: PORTABLE CHEST 1 VIEW COMPARISON:  05/21/2023 FINDINGS: Endotracheal tube with tip between the clavicular heads and carina. An enteric tube tip reaches the stomach with side port near the GE junction. Porta catheter with tip at the SVC. Cardiomegaly and diffuse interstitial opacity with Kerley lines and cephalized blood flow. No focal consolidation, significant effusion, or pneumothorax. IMPRESSION: 1. CHF pattern. 2. Located enteric and endotracheal tubes. Electronically Signed   By: Tiburcio Pea M.D.   On: 08/28/2023 07:42     Subjective: Patient seen examined bedside, resting calmly.  Lying in bed.  No specific complaints this morning.  Dyspnea markedly improved, weaned from supplemental oxygen.  Discussed with cardiology this morning, okay for discharge home with outpatient follow-up scheduled.  Discussed  will refill his meds through [facility] pharmacy.  Has primary care physician appointment scheduled for tomorrow 09/01/2023.  Also will have outpatient cardiology follow-up, cardiologist states will schedule.  Discussed with patient needs complete adherence to his medical regimen as well as follow-up as scheduled.  Patient with no other specific questions or concerns at this time.  Denies headache, no visual changes, no chest pain, no palpitations, no shortness of breath, no abdominal pain, no fever/chills/night sweats, no nausea cefonicid diarrhea, no focal weakness, no fatigue, no paresthesias.  No acute events overnight per nursing staff.  Discharge Exam: Vitals:   08/30/23 2121 08/31/23 1010  BP:  (!) 145/101  Pulse: 78 74  Resp: 19   Temp: 98.3 F (36.8 C)   SpO2: 93% 97%   Vitals:   08/30/23 2000 08/30/23 2121 08/31/23 0500 08/31/23 1010  BP: (!) 161/95   (!) 145/101  Pulse: 75 78  74  Resp: 20 19    Temp: 98.3 F (36.8 C) 98.3 F (36.8 C)    TempSrc: Oral Oral  SpO2: 91% 93%  97%  Weight:  74.8 kg 74.8 kg   Height:  6\' 1"  (1.854 m)      Physical Exam: GEN: NAD, alert and oriented x 3, chronically ill appearance HEENT: NCAT, PERRL, EOMI, sclera clear, MMM PULM: CTAB w/o wheezes/crackles, normal respiratory effort, on room air CV: RRR w/o M/G/R GI: abd soft, NTND, NABS, no R/G/M MSK: no peripheral edema, muscle strength globally intact 5/5 bilateral upper/lower extremities NEURO: CN II-XII intact, no focal deficits, sensation to light touch intact PSYCH: normal mood/affect Integumentary: dry/intact, no rashes or wounds    The results of significant diagnostics from this hospitalization (including imaging, microbiology, ancillary and laboratory) are listed below for reference.     Microbiology: Recent Results (from the past 240 hour(s))  MRSA Next Gen by PCR, Nasal     Status: None   Collection Time: 08/28/23  3:36 PM   Specimen: Nasal Mucosa; Nasal Swab  Result Value  Ref Range Status   MRSA by PCR Next Gen NOT DETECTED NOT DETECTED Final    Comment: (NOTE) The GeneXpert MRSA Assay (FDA approved for NASAL specimens only), is one component of a comprehensive MRSA colonization surveillance program. It is not intended to diagnose MRSA infection nor to guide or monitor treatment for MRSA infections. Test performance is not FDA approved in patients less than 63 years old. Performed at [facility], [address] [place], [place] [address]   Culture, Respiratory w Gram Stain     Status: None   Collection Time: 08/29/23 10:50 AM   Specimen: Tracheal Aspirate; Respiratory  Result Value Ref Range Status   Specimen Description TRACHEAL ASPIRATE  Final   Special Requests NONE  Final   Gram Stain   Final    FEW WBC PRESENT, PREDOMINANTLY PMN FEW SQUAMOUS EPITHELIAL CELLS PRESENT ABUNDANT GRAM POSITIVE COCCI MODERATE GRAM NEGATIVE RODS    Culture   Final    MODERATE Normal respiratory flora-no Staph aureus or Pseudomonas seen Performed at [facility], [address], [place], [place] [address]    Report Status 08/31/2023 FINAL  Final     Labs: BNP (last 3 results) Recent Labs    08/29/23 0401 08/30/23 0311 08/31/23 0748  BNP 2,364.9* 792.4* 2,258.1*   Basic Metabolic Panel: Recent Labs  Lab 08/28/23 0702 08/28/23 1333 08/28/23 1652 08/29/23 0401 08/30/23 0311 08/31/23 0748  NA 139 143 143 145 140 139  K 4.5 4.3 4.0 3.8 3.7 3.4*  CL 101  --   --  108 98 97*  CO2 27  --   --  26 29 26   GLUCOSE 243*  --   --  76 87 181*  BUN 30*  --   --  28* 26* 23*  CREATININE 2.21*  --   --  1.81* 1.54* 1.46*  CALCIUM 8.4*  --   --  8.4* 9.3 9.9  MG  --   --   --   --  2.2  --   PHOS  --   --   --   --  4.7*  --    Liver Function Tests: Recent Labs  Lab 08/28/23 0702 08/29/23 0401 08/30/23 0311 08/31/23 0748  AST 34 20 20 24   ALT 25 17 18 22   ALKPHOS 81 64 63 65  BILITOT 0.6 0.8 0.6 0.6  PROT 7.6 6.2* 6.4* 6.8  ALBUMIN 3.8  3.0* 3.1* 3.2*   No results for input(s): "LIPASE", "AMYLASE" in the last 168 hours.  No results for input(s): "AMMONIA" in the last 168 hours. CBC: Recent Labs  Lab 08/28/23 0702 08/28/23 1333 08/28/23 1652 08/28/23 1654 08/29/23 1852 08/30/23 0311 08/31/23 0748  WBC 11.8*  --   --  7.2 6.5 4.8 4.2  NEUTROABS 6.9  --   --   --   --   --   --   HGB 14.1   < > 12.2* 11.7* 12.0* 11.1* 12.3*  HCT 43.8   < > 36.0* 35.1* 37.4* 34.0* 37.1*  MCV 97.3  --   --  93.4 95.4 94.2 92.1  PLT 256  --   --  160 156 125* 134*   < > = values in this interval not displayed.   Cardiac Enzymes: No results for input(s): "CKTOTAL", "CKMB", "CKMBINDEX", "TROPONINI" in the last 168 hours. BNP: Invalid input(s): "POCBNP" CBG: Recent Labs  Lab 08/28/23 0832  GLUCAP 126*   D-Dimer No results for input(s): "DDIMER" in the last 72 hours. Hgb A1c Recent Labs    08/29/23 1852  HGBA1C 5.6   Lipid Profile Recent Labs    08/29/23 0401  TRIG 219*   Thyroid function studies No results for input(s): "TSH", "T4TOTAL", "T3FREE", "THYROIDAB" in the last 72 hours.  Invalid input(s): "FREET3" Anemia work up No results for input(s): "VITAMINB12", "FOLATE", "FERRITIN", "TIBC", "IRON", "RETICCTPCT" in the last 72 hours. Urinalysis    Component Value Date/Time   COLORURINE YELLOW 08/30/2022 0625   APPEARANCEUR CLEAR 08/30/2022 0625   LABSPEC 1.016 08/30/2022 0625   PHURINE 5.0 08/30/2022 0625   GLUCOSEU NEGATIVE 08/30/2022 0625   HGBUR SMALL (A) 08/30/2022 0625   BILIRUBINUR NEGATIVE 08/30/2022 0625   KETONESUR NEGATIVE 08/30/2022 0625   PROTEINUR 100 (A) 08/30/2022 0625   NITRITE NEGATIVE 08/30/2022 0625   LEUKOCYTESUR NEGATIVE 08/30/2022 0625   Sepsis Labs Recent Labs  Lab 08/28/23 1654 08/29/23 1852 08/30/23 0311 08/31/23 0748  WBC 7.2 6.5 4.8 4.2   Microbiology Recent Results (from the past 240 hour(s))  MRSA Next Gen by PCR, Nasal     Status: None   Collection Time: 08/28/23  3:36  PM   Specimen: Nasal Mucosa; Nasal Swab  Result Value Ref Range Status   MRSA by PCR Next Gen NOT DETECTED NOT DETECTED Final    Comment: (NOTE) The GeneXpert MRSA Assay (FDA approved for NASAL specimens only), is one component of a comprehensive MRSA colonization surveillance program. It is not intended to diagnose MRSA infection nor to guide or monitor treatment for MRSA infections. Test performance is not FDA approved in patients less than 36 years old. Performed at [facility], [address] [place], [place] [address]   Culture, Respiratory w Gram Stain     Status: None   Collection Time: 08/29/23 10:50 AM   Specimen: Tracheal Aspirate; Respiratory  Result Value Ref Range Status   Specimen Description TRACHEAL ASPIRATE  Final   Special Requests NONE  Final   Gram Stain   Final    FEW WBC PRESENT, PREDOMINANTLY PMN FEW SQUAMOUS EPITHELIAL CELLS PRESENT ABUNDANT GRAM POSITIVE COCCI MODERATE GRAM NEGATIVE RODS    Culture   Final    MODERATE Normal respiratory flora-no Staph aureus or Pseudomonas seen Performed at [facility], [address], [place], [place] [address]    Report Status 08/31/2023 FINAL  Final     Time coordinating discharge: Over 30 minutes  SIGNED:   Alvira Philips [place], DO  [facility] 08/31/2023, 11:03 AM

## 2023-08-31 NOTE — Plan of Care (Signed)

## 2023-08-31 NOTE — Plan of Care (Signed)
  Problem: Education: Goal: Knowledge of General Education information will improve Description: Including pain rating scale, medication(s)/side effects and non-pharmacologic comfort measures Outcome: Progressing   Problem: Clinical Measurements: Goal: Ability to maintain clinical measurements within normal limits will improve Outcome: Progressing   Problem: Clinical Measurements: Goal: Diagnostic test results will improve Outcome: Progressing   Problem: Clinical Measurements: Goal: Respiratory complications will improve Outcome: Progressing   

## 2023-08-31 NOTE — Progress Notes (Signed)
   Patient Name: Richard Cox Date of Encounter: 08/31/2023 [facility] Cardiologist: Nona Dell, MD   Interval Summary  .    No dyspnea, no other CV complaints. BP improved, but DBP still high.  Vital Signs .    Vitals:   08/30/23 2000 08/30/23 2121 08/31/23 0500 08/31/23 1010  BP: (!) 161/95   (!) 145/101  Pulse: 75 78  74  Resp: 20 19    Temp: 98.3 F (36.8 C) 98.3 F (36.8 C)    TempSrc: Oral Oral    SpO2: 91% 93%  97%  Weight:  74.8 kg 74.8 kg   Height:  6\' 1"  (1.854 m)      Intake/Output Summary (Last 24 hours) at 08/31/2023 1028 Last data filed at 08/31/2023 0300 Gross per 24 hour  Intake 983.86 ml  Output 2701 ml  Net -1717.14 ml      08/31/2023    5:00 AM 08/30/2023    9:21 PM 08/30/2023    5:00 AM  Last 3 Weights  Weight (lbs) 164 lb 14.5 oz 164 lb 14.5 oz 157 lb 6.5 oz  Weight (kg) 74.8 kg 74.8 kg 71.4 kg      Telemetry/ECG    NSR - Personally Reviewed  Physical Exam .   GEN: No acute distress.   Neck: No JVD Cardiac: RRR, no murmurs, rubs, or gallops.  Respiratory: Clear to auscultation bilaterally. GI: Soft, nontender, non-distended  MS: No edema  Assessment & Plan .     Resume Entresto and oral diuretic. Increase hydralazine. Will make him an appointment in [facility] w Cardiology, but I worry that compliance will continue to be an issue.  For questions or updates, please contact [facility] Please consult [email or web address] for contact info under        Signed, Thurmon Fair, MD

## 2023-08-31 NOTE — Progress Notes (Signed)
Mobility Specialist: Progress Note   08/31/23 1150  Mobility  Activity Ambulated independently in hallway  Level of Assistance Independent  Assistive Device None  Distance Ambulated (ft) 550 ft  Activity Response Tolerated well  Mobility Referral Yes  $Mobility charge 1 Mobility  Mobility Specialist Start Time (ACUTE ONLY) 0955  Mobility Specialist Stop Time (ACUTE ONLY) 1010  Mobility Specialist Time Calculation (min) (ACUTE ONLY) 15 min    During Mobility: SpO2 80-85 RA   Pt was agreeable to mobility session - received on EOB. SpO2 dropped to low 80s during mobility but unable to get accurate pleth on pulse ox. Denies any SOB, dizziness, or lightheadedness and no cough. Returned to room without fault. Left in bed with all needs met, call bell in reach.   Maurene Capes Mobility Specialist Please contact via SecureChat or Rehab office at [phone]

## 2023-08-31 NOTE — Telephone Encounter (Addendum)
Transition of Care Follow-up Phone Call Request    Patient Name: Donaven Spikes Date of Birth: 21-Apr-1981 Date of Encounter: 08/31/2023  Primary Care Provider:  Pcp, No Primary Cardiologist:  Nona Dell, MD  Duanne Guess has been scheduled for a transition of care follow up appointment with a HeartCare provider:  Not able to schedule any appointments for this patient in [place], found appointment in [place] with Lyle. 11/14 @9am  ([place])   Please reach out to Duanne Guess within 48 hours of discharge to confirm appointment and review transition of care protocol questionnaire. Anticipated discharge date: TBD  Abagail Kitchens, PA-C  08/31/2023, 10:41 AM

## 2023-09-01 ENCOUNTER — Ambulatory Visit: Payer: Medicare Other | Admitting: Nurse Practitioner

## 2023-09-01 ENCOUNTER — Encounter: Payer: Self-pay | Admitting: Family Medicine

## 2023-09-01 ENCOUNTER — Ambulatory Visit (INDEPENDENT_AMBULATORY_CARE_PROVIDER_SITE_OTHER): Payer: Medicare Other | Admitting: Family Medicine

## 2023-09-01 VITALS — BP 99/65 | HR 74 | Ht 73.0 in | Wt 155.0 lb

## 2023-09-01 DIAGNOSIS — E038 Other specified hypothyroidism: Secondary | ICD-10-CM | POA: Diagnosis not present

## 2023-09-01 DIAGNOSIS — M25552 Pain in left hip: Secondary | ICD-10-CM | POA: Diagnosis not present

## 2023-09-01 DIAGNOSIS — F419 Anxiety disorder, unspecified: Secondary | ICD-10-CM

## 2023-09-01 DIAGNOSIS — E559 Vitamin D deficiency, unspecified: Secondary | ICD-10-CM

## 2023-09-01 DIAGNOSIS — E538 Deficiency of other specified B group vitamins: Secondary | ICD-10-CM

## 2023-09-01 DIAGNOSIS — I10 Essential (primary) hypertension: Secondary | ICD-10-CM

## 2023-09-01 DIAGNOSIS — D509 Iron deficiency anemia, unspecified: Secondary | ICD-10-CM

## 2023-09-01 MED ORDER — HYDROXYZINE PAMOATE 25 MG PO CAPS: 25.0000 mg | ORAL_CAPSULE | Freq: Three times a day (TID) | ORAL | 1 refills | Status: DC | PRN

## 2023-09-01 MED ORDER — ESCITALOPRAM OXALATE 10 MG PO TABS: 10.0000 mg | ORAL_TABLET | Freq: Every day | ORAL | 3 refills | Status: DC

## 2023-09-01 MED ORDER — CYCLOBENZAPRINE HCL 5 MG PO TABS: 5.0000 mg | ORAL_TABLET | Freq: Three times a day (TID) | ORAL | 1 refills | Status: DC | PRN

## 2023-09-01 NOTE — Progress Notes (Signed)
New Patient Office Visit   Subjective   Patient ID: Richard Cox, male    DOB: 1980-11-22  Age: 42 y.o. MRN: [id number]  CC:  Chief Complaint  Patient presents with   Establish Care    Needs medication maintenance refills. Anxiety issues, Left hip pain causing issues with mobility.     HPI Richard Cox 42 year old male, presents to establish care. 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  Hip Pain  The patient reports left hip pain that began 2 days ago, with no known injury or trauma. The pain is described as cramping, fluctuating in intensity, and rated at 9/10. The patient also reports limping due to the severity of the pain. Associated symptoms include inability to bear weight, muscle weakness, but the patient denies any, numbness, or tingling. The pain is worsened by movement, palpation, and weight-bearing activities. The patient has attempted rest and acetaminophen for relief, but these have been ineffective.  Anxiety  The patient presents for an initial visit regarding anxiety, which began 1 year ago and has gradually worsened over the past year. Symptoms include difficulty concentrating, excessive worry, insomnia, irritability, muscle tension, nervousness, and shortness of breath. Reports that these symptoms occur most days and significantly interfere with daily life.The patient denies suicidal thoughts.  Contributing factors include health concerns and financial stress. The patient sleeps only 4 hours per night, with poor sleep quality. Risk factors include a history of illicit drug use, prior traumatic  experiences, and a major life event. The patient has a history of anxiety and panic attacks but has not received prior treatment.       Outpatient Encounter Medications as of 09/01/2023  Medication Sig   bictegravir-emtricitabine-tenofovir AF (BIKTARVY) 50-200-25 MG TABS tablet Take 1 tablet by mouth daily.   carvedilol (COREG) 25 MG tablet Take 1 tablet (25 mg total) by mouth 2 (two) times daily with a meal.   cefdinir (OMNICEF) 300 MG capsule Take 1 capsule (300 mg total) by mouth 2 (two) times daily for 5 days.   cyclobenzaprine (FLEXERIL) 5 MG tablet Take 1 tablet (5 mg total) by mouth 3 (three) times daily as needed for muscle spasms.   doxycycline (VIBRA-TABS) 100 MG tablet Take 1 tablet (100 mg total) by mouth every 12 (twelve) hours for 5 days.   escitalopram (LEXAPRO) 10 MG tablet Take 1 tablet (10 mg total) by mouth daily.   famotidine (PEPCID) 20 MG tablet Take 1 tablet (20 mg total) by mouth daily.   furosemide (LASIX) 80 MG tablet Take 1 tablet (80 mg total) by mouth daily.   hydrALAZINE (APRESOLINE) 25 MG tablet Take 2 tablets (50 mg total) by mouth every 8 (eight) hours.   hydrOXYzine (VISTARIL) 25 MG capsule Take 1 capsule (25 mg total) by mouth every 8 (eight) hours as needed for anxiety. Take 1 tablet at bedtime   isosorbide dinitrate (ISORDIL) 10 MG tablet Take 1 tablet (10 mg total) by mouth 3 (three) times daily.   sacubitril-valsartan (ENTRESTO) 24-26 MG Take 1 tablet by mouth 2 (two) times daily.   spironolactone (ALDACTONE) 25 MG tablet Take 1 tablet (25 mg total) by mouth daily.   acetaminophen (TYLENOL) 500 MG tablet Take 1,500 mg  by mouth as needed for moderate pain (pain score 4-6), headache or mild pain (pain score 1-3).   aspirin 81 MG chewable tablet Chew 1 tablet (81 mg total) by mouth daily. (Patient not taking: Reported on 09/01/2023)   Facility-Administered Encounter Medications as of 09/01/2023  Medication   sodium chloride flush (NS) 0.9 % injection 10  mL   sodium chloride flush (NS) 0.9 % injection 10 mL    Past Surgical History:  Procedure Laterality Date   IR IMAGING GUIDED PORT INSERTION  08/23/2020   Right   NO PAST SURGERIES      Review of Systems  Constitutional:  Negative for chills and fever.  Eyes:  Negative for blurred vision.  Respiratory:  Positive for shortness of breath.   Cardiovascular:  Negative for leg swelling.  Gastrointestinal:  Negative for abdominal pain.  Musculoskeletal:  Positive for joint pain and myalgias.  Neurological:  Positive for headaches.  Psychiatric/Behavioral:  The patient is nervous/anxious and has insomnia.       Objective    BP 99/65   Pulse 74   Ht 6\' 1"  (1.854 m)   Wt 155 lb (70.3 kg)   SpO2 98%   BMI 20.45 kg/m   Physical Exam Vitals reviewed.  Constitutional:      General: He is not in acute distress.    Appearance: Normal appearance. He is not ill-appearing, toxic-appearing or diaphoretic.  HENT:     Head: Normocephalic.  Eyes:     General:        Right eye: No discharge.        Left eye: No discharge.     Conjunctiva/sclera: Conjunctivae normal.  Cardiovascular:     Rate and Rhythm: Normal rate.     Pulses: Normal pulses.     Heart sounds: Normal heart sounds.  Pulmonary:     Effort: Pulmonary effort is normal. No respiratory distress.     Breath sounds: Normal breath sounds.  Abdominal:     General: Bowel sounds are normal.     Palpations: Abdomen is soft.     Tenderness: There is no abdominal tenderness. There is no guarding.  Musculoskeletal:        General: Tenderness present.     Cervical back: Normal range of motion.     Right hip: Normal range of motion. Normal strength.     Left hip: Tenderness present. Decreased range of motion. Decreased strength.  Skin:    General: Skin is warm and dry.     Capillary Refill: Capillary refill takes less than 2 seconds.  Neurological:     Mental Status: He is alert.     Coordination: Coordination normal.      Gait: Gait normal.  Psychiatric:        Mood and Affect: Mood normal.        Behavior: Behavior normal.       Assessment & Plan:  Left hip pain Assessment & Plan: Xray ordered awaiting results will follow up Trial on Flexeril 5 mg PRN I explained to the patient that non-pharmacological interventions include the application of ice or heat, rest, and recommended range of motion exercises along with gentle stretching. For pain management, Tylenol was advised. The patient was instructed to follow up if symptoms worsen or persist. The patient verbalized understanding of the care plan, and all questions were answered.   Orders: -     Cyclobenzaprine HCl; Take 1 tablet (5 mg total) by mouth 3 (three) times daily as  needed for muscle spasms.  Dispense: 30 tablet; Refill: 1 -     DG HIP UNILAT W OR W/O PELVIS 2-3 VIEWS LEFT; Future  TSH (thyroid-stimulating hormone deficiency) -     TSH + free T4  Vitamin D deficiency -     VITAMIN D 25 Hydroxy (Vit-D Deficiency, Fractures)  Vitamin B12 deficiency -     Vitamin B12  Iron deficiency anemia, unspecified iron deficiency anemia type -     Iron, TIBC and Ferritin Panel  Primary hypertension -     CMP14+EGFR -     CBC with Differential/Platelet  Anxiety Assessment & Plan:    09/01/2023    2:53 PM 09/01/2023    2:52 PM  GAD 7 : Generalized Anxiety Score  Nervous, Anxious, on Edge 1 1  Control/stop worrying 1 1  Worry too much - different things 2 2  Trouble relaxing 3 3  Restless 2 2  Easily annoyed or irritable 3 3  Afraid - awful might happen 2 2  Total GAD 7 Score 14 14  Anxiety Difficulty Somewhat difficult Somewhat difficult  Trial on Lexapro 10 mg once daily Hydroxyzine 25 mg PRN   We discussed several non-pharmacological approaches to managing depression, including:  Establishing a consistent daily routine: This helps create structure and stability. Practicing mindfulness and relaxation techniques: Incorporating  meditation, deep breathing exercises, or yoga to manage stress and improve emotional well-being. Engaging in regular physical activity: Aim for at least 30 minutes of exercise most days to boost mood and energy levels. Spending time outdoors: Exposure to natural light and fresh air can improve mental health. Building a support network: Encouraging social connections with friends, family, or support groups to reduce feelings of isolation. Prioritizing a balanced diet: Eating nutrient-rich foods while avoiding excessive amounts of processed foods, sugar, and unhealthy fats. Follow-up is recommended in 4-8 weeks to assess progress, with a referral to behavioral health for further support if needed.  Patient verbally consented to [facility] services about presenting concerns and psychiatric consultation as appropriate. The services will be billed as appropriate for the patient.     Orders: -     Ambulatory referral to Behavioral Health  Other orders -     hydrOXYzine Pamoate; Take 1 capsule (25 mg total) by mouth every 8 (eight) hours as needed for anxiety. Take 1 tablet at bedtime  Dispense: 30 capsule; Refill: 1 -     Escitalopram Oxalate; Take 1 tablet (10 mg total) by mouth daily.  Dispense: 30 tablet; Refill: 3    Return in about 8 weeks (around 10/27/2023), or if symptoms worsen or fail to improve, for Anxiety.   Cruzita Lederer Newman Nip, FNP

## 2023-09-01 NOTE — Telephone Encounter (Signed)
MyChart Message sent and spoke with patients mother and advised her of this appointment she verbalized understanding and will make sure her son is here for appointment

## 2023-09-01 NOTE — Assessment & Plan Note (Signed)
Xray ordered awaiting results will follow up Trial on Flexeril 5 mg PRN I explained to the patient that non-pharmacological interventions include the application of ice or heat, rest, and recommended range of motion exercises along with gentle stretching. For pain management, Tylenol was advised. The patient was instructed to follow up if symptoms worsen or persist. The patient verbalized understanding of the care plan, and all questions were answered.

## 2023-09-01 NOTE — Assessment & Plan Note (Signed)
    09/01/2023    2:53 PM 09/01/2023    2:52 PM  GAD 7 : Generalized Anxiety Score  Nervous, Anxious, on Edge 1 1  Control/stop worrying 1 1  Worry too much - different things 2 2  Trouble relaxing 3 3  Restless 2 2  Easily annoyed or irritable 3 3  Afraid - awful might happen 2 2  Total GAD 7 Score 14 14  Anxiety Difficulty Somewhat difficult Somewhat difficult  Trial on Lexapro 10 mg once daily Hydroxyzine 25 mg PRN   We discussed several non-pharmacological approaches to managing depression, including:  Establishing a consistent daily routine: This helps create structure and stability. Practicing mindfulness and relaxation techniques: Incorporating meditation, deep breathing exercises, or yoga to manage stress and improve emotional well-being. Engaging in regular physical activity: Aim for at least 30 minutes of exercise most days to boost mood and energy levels. Spending time outdoors: Exposure to natural light and fresh air can improve mental health. Building a support network: Encouraging social connections with friends, family, or support groups to reduce feelings of isolation. Prioritizing a balanced diet: Eating nutrient-rich foods while avoiding excessive amounts of processed foods, sugar, and unhealthy fats. Follow-up is recommended in 4-8 weeks to assess progress, with a referral to behavioral health for further support if needed.  Patient verbally consented to [facility] services about presenting concerns and psychiatric consultation as appropriate. The services will be billed as appropriate for the patient.

## 2023-09-01 NOTE — Patient Instructions (Signed)

## 2023-09-14 ENCOUNTER — Ambulatory Visit: Payer: Medicare Other | Admitting: Family

## 2023-09-23 ENCOUNTER — Ambulatory Visit: Payer: Medicare Other | Attending: Nurse Practitioner | Admitting: Nurse Practitioner

## 2023-09-23 NOTE — Progress Notes (Deleted)
  Cardiology Office Note:  .   Date:  09/23/2023  ID:  Richard Cox, DOB 09/03/1981, MRN [id number] PCP: Rica Records, FNP  [facility] Providers Cardiologist:  Nona Dell, MD { Click to update primary MD,subspecialty MD or APP then REFRESH:1}   History of Present Illness: .   Richard Cox is a 42 y.o. male with a PMH of hypertrophic cardiomyopathy, chronic systolic CHF, HIV/AIDS, hypertension, history of B-cell lymphoma in remission, polysubstance abuse with cocaine, CKD, and medical noncompliance, who presents today for TOC/hospital follow-up.  Presented to [facility] in October with acute shortness of breath.  Patient reported he had not been taking his medication for the last few days. BNP > 4500.  High sensitive troponin 147, 156.  Lactic acid 0.7.  UDS positive for cocaine, benzodiazepines.  CXR revealed cardiomegaly, diffuse interstitial opacities with curly lines and cephalized blood flow, findings consistent with pulmonary edema.  Hospital course noted by intubation and placed on ventilator.  Required IV diuresis, IV nitroglycerin drip for severe hypertension.  Transferred to [facility] for critical care services. Tx for CAP. TTE revealed EF 25 to 30%, global hypokinesis of left ventricle, severe asymmetric left ventricular hypertrophy of septal segment, also involving right ventricle, findings consistent with hypertrophic cardiomyopathy, although no obvious LVOT gradient to suggest obstruction at rest, grade 1 DD, normal PASP, severely increased right ventricular wall thickness, severely dilated left atrial size.  Today he presents for TOC follow-up.  He states that that that  ROS: ***  Studies Reviewed: .        *** Risk Assessment/Calculations:   {Does this patient have ATRIAL FIBRILLATION?[phone]} No BP recorded.  {Refresh Note OR Click here to enter BP  :1}***       Physical Exam:   VS:  There were no vitals taken for this visit.   Wt Readings from  Last 3 Encounters:  09/01/23 155 lb (70.3 kg)  08/31/23 164 lb 14.5 oz (74.8 kg)  05/24/23 160 lb 11.5 oz (72.9 kg)    GEN: Well nourished, well developed in no acute distress NECK: No JVD; No carotid bruits CARDIAC: ***RRR, no murmurs, rubs, gallops RESPIRATORY:  Clear to auscultation without rales, wheezing or rhonchi  ABDOMEN: Soft, non-tender, non-distended EXTREMITIES:  No edema; No deformity   ASSESSMENT AND PLAN: .   ***    {Are you ordering a CV Procedure (e.g. stress test, cath, DCCV, TEE, etc)?   Press F2        :[id number]}  Dispo: ***  Signed, Sharlene Dory, NP

## 2023-10-06 ENCOUNTER — Ambulatory Visit: Payer: Medicare Other | Admitting: Family

## 2023-10-26 NOTE — Progress Notes (Unsigned)
   Established Patient Office Visit   Subjective  Patient ID: Richard Cox, male    DOB: 1981/04/22  Age: 42 y.o. MRN: [id number]  No chief complaint on file.   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  HPI  ROS    Objective:     There were no vitals taken for this visit. {Vitals History (Optional):23777}  Physical Exam   No results found for any visits on 10/27/23.  The ASCVD Risk score (Arnett DK, et al., 2019) failed to calculate for the following reasons:   Risk score cannot be calculated because patient has a medical history suggesting prior/existing ASCVD    Assessment & Plan:  There are no diagnoses linked to this encounter.  No follow-ups on file.   Cruzita Lederer Newman Nip, FNP

## 2023-10-26 NOTE — Patient Instructions (Signed)

## 2023-10-27 ENCOUNTER — Encounter (INDEPENDENT_AMBULATORY_CARE_PROVIDER_SITE_OTHER): Payer: Medicare Other | Admitting: Family Medicine

## 2023-10-27 NOTE — Progress Notes (Signed)
CANCELED

## 2023-10-31 NOTE — Progress Notes (Unsigned)
   Established Patient Office Visit   Subjective  Patient ID: Richard Cox, male    DOB: 25-Dec-1980  Age: 42 y.o. MRN: [id number]  No chief complaint on file.   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  HPI  ROS    Objective:     There were no vitals taken for this visit. {Vitals History (Optional):23777}  Physical Exam   No results found for any visits on 11/01/23.  The ASCVD Risk score (Arnett DK, et al., 2019) failed to calculate for the following reasons:   Risk score cannot be calculated because patient has a medical history suggesting prior/existing ASCVD    Assessment & Plan:  There are no diagnoses linked to this encounter.  No follow-ups on file.   Cruzita Lederer Newman Nip, FNP

## 2023-10-31 NOTE — Patient Instructions (Signed)

## 2023-11-01 ENCOUNTER — Encounter: Payer: Self-pay | Admitting: Family Medicine

## 2023-11-01 ENCOUNTER — Ambulatory Visit: Payer: Medicare Other | Admitting: Family Medicine

## 2023-11-01 VITALS — BP 120/78 | HR 86 | Ht 73.0 in | Wt 156.1 lb

## 2023-11-01 DIAGNOSIS — F419 Anxiety disorder, unspecified: Secondary | ICD-10-CM

## 2023-11-01 DIAGNOSIS — I509 Heart failure, unspecified: Secondary | ICD-10-CM | POA: Diagnosis not present

## 2023-11-01 DIAGNOSIS — B20 Human immunodeficiency virus [HIV] disease: Secondary | ICD-10-CM

## 2023-11-01 DIAGNOSIS — H66001 Acute suppurative otitis media without spontaneous rupture of ear drum, right ear: Secondary | ICD-10-CM

## 2023-11-01 DIAGNOSIS — H6690 Otitis media, unspecified, unspecified ear: Secondary | ICD-10-CM | POA: Insufficient documentation

## 2023-11-01 MED ORDER — FUROSEMIDE 80 MG PO TABS: 80.0000 mg | ORAL_TABLET | Freq: Every day | ORAL | 2 refills | Status: DC

## 2023-11-01 MED ORDER — AMOXICILLIN-POT CLAVULANATE 875-125 MG PO TABS: 1.0000 | ORAL_TABLET | Freq: Two times a day (BID) | ORAL | 0 refills | Status: AC

## 2023-11-01 MED ORDER — HYDROXYZINE PAMOATE 25 MG PO CAPS: 25.0000 mg | ORAL_CAPSULE | Freq: Three times a day (TID) | ORAL | 3 refills | Status: DC | PRN

## 2023-11-01 MED ORDER — ESCITALOPRAM OXALATE 10 MG PO TABS: 10.0000 mg | ORAL_TABLET | Freq: Every day | ORAL | 3 refills | Status: DC

## 2023-11-01 NOTE — Assessment & Plan Note (Signed)
Augmentin 875-125 mg twice daily x 7 days Advise patient to rest to support your body's recovery. Stay hydrated by drinking water, tea, or broth. Using a humidifier can help irritation and ease nasal congestion. For fever or pain, acetaminophen (Tylenol) is recommended. To relieve other symptoms, try saline nasal sprays, throat lozenges, or gargling with saltwater. Focus on eating light, healthy meals like fruits and vegetables to keep your strength up. Practice good hygiene by washing your hands frequently and covering your mouth when coughing or sneezing.Follow-up for worsening or persistent symptoms. Patient verbalizes understanding regarding plan of care and all questions answered

## 2023-11-01 NOTE — Assessment & Plan Note (Signed)
    11/01/2023    9:10 AM 09/01/2023    2:53 PM 09/01/2023    2:52 PM  GAD 7 : Generalized Anxiety Score  Nervous, Anxious, on Edge 0 1 1  Control/stop worrying 0 1 1  Worry too much - different things 0 2 2  Trouble relaxing 2 3 3   Restless 0 2 2  Easily annoyed or irritable 2 3 3   Afraid - awful might happen 1 2 2   Total GAD 7 Score 5 14 14   Anxiety Difficulty Not difficult at all Somewhat difficult Somewhat difficult   Controlled on Lexapro 10 mg once daily Hydroxyzine 25 mg PRN  Continued discussion on lifestyle changes, establishing a daily routine, going outdoors, exercise, healthy eating habits, mindfulness and mediatation.

## 2023-11-02 LAB — CBC WITH DIFFERENTIAL/PLATELET
Basophils Absolute: 0 10*3/uL (ref 0.0–0.2)
Basos: 1 %
EOS (ABSOLUTE): 0.1 10*3/uL (ref 0.0–0.4)
Eos: 1 %
Hematocrit: 39.9 % (ref 37.5–51.0)
Hemoglobin: 13.5 g/dL (ref 13.0–17.7)
Immature Grans (Abs): 0 10*3/uL (ref 0.0–0.1)
Immature Granulocytes: 0 %
Lymphocytes Absolute: 1.7 10*3/uL (ref 0.7–3.1)
Lymphs: 30 %
MCH: 31.7 pg (ref 26.6–33.0)
MCHC: 33.8 g/dL (ref 31.5–35.7)
MCV: 94 fL (ref 79–97)
Monocytes Absolute: 0.4 10*3/uL (ref 0.1–0.9)
Monocytes: 7 %
Neutrophils Absolute: 3.5 10*3/uL (ref 1.4–7.0)
Neutrophils: 61 %
Platelets: 211 10*3/uL (ref 150–450)
RBC: 4.26 x10E6/uL (ref 4.14–5.80)
RDW: 14.4 % (ref 11.6–15.4)
WBC: 5.6 10*3/uL (ref 3.4–10.8)

## 2023-11-02 LAB — IRON,TIBC AND FERRITIN PANEL
Ferritin: 104 ng/mL (ref 30–400)
Iron Saturation: 18 % (ref 15–55)
Iron: 57 ug/dL (ref 38–169)
Total Iron Binding Capacity: 318 ug/dL (ref 250–450)
UIBC: 261 ug/dL (ref 111–343)

## 2023-11-02 LAB — TSH+FREE T4
Free T4: 1.54 ng/dL (ref 0.82–1.77)
TSH: 0.691 u[IU]/mL (ref 0.450–4.500)

## 2023-11-02 LAB — CMP14+EGFR
ALT: 17 [IU]/L (ref 0–44)
AST: 21 [IU]/L (ref 0–40)
Albumin: 4.2 g/dL (ref 4.1–5.1)
Alkaline Phosphatase: 99 [IU]/L (ref 44–121)
BUN/Creatinine Ratio: 15 (ref 9–20)
BUN: 23 mg/dL (ref 6–24)
Bilirubin Total: 0.3 mg/dL (ref 0.0–1.2)
CO2: 25 mmol/L (ref 20–29)
Calcium: 9.4 mg/dL (ref 8.7–10.2)
Chloride: 99 mmol/L (ref 96–106)
Creatinine, Ser: 1.49 mg/dL — ABNORMAL HIGH (ref 0.76–1.27)
Globulin, Total: 2.9 g/dL (ref 1.5–4.5)
Glucose: 79 mg/dL (ref 70–99)
Potassium: 4 mmol/L (ref 3.5–5.2)
Sodium: 141 mmol/L (ref 134–144)
Total Protein: 7.1 g/dL (ref 6.0–8.5)
eGFR: 60 mL/min/{1.73_m2} (ref >59)

## 2023-11-02 LAB — VITAMIN B12: Vitamin B-12: 399 pg/mL (ref 232–1245)

## 2023-11-02 LAB — VITAMIN D 25 HYDROXY (VIT D DEFICIENCY, FRACTURES): Vit D, 25-Hydroxy: 6.7 ng/mL — ABNORMAL LOW (ref 30.0–100.0)

## 2023-11-11 ENCOUNTER — Ambulatory Visit: Payer: Medicare Other | Admitting: Family

## 2023-11-26 ENCOUNTER — Ambulatory Visit: Payer: Self-pay | Admitting: Family Medicine

## 2023-11-26 NOTE — Telephone Encounter (Signed)
Copied from CRM [id number]. Topic: Appointments - Appointment Scheduling >> Nov 26, 2023  1:11 PM Twila L wrote: Patient/patient representative is calling to schedule an appointment. Refer to attachments for appointment information.   Chief Complaint: cough, chest congestion  Symptoms: Shortness of breath at rest, wheezing, and rattles when exhaling Frequency: constant Pertinent Negatives: Patient denies fever Disposition: [x] ED /[] Urgent Care (no appt availability in office) / [] Appointment(In office/virtual)/ []  [facility]/ [] Home Care/ [] Refused Recommended Disposition /[]  Mobile Bus/ []  Follow-up with PCP Additional Notes: Pt reports cough x 3 days, states that he is experiencing some wheezing and can hear rattles when he exhales. Pt also states that he is SOB at rest and "not breathing at full capacity".  This RN advising ED given extensive hx of acute respiratory failure, most recent October 2024. Pt agreeable. States that he will go to [facility]  Reason for Disposition  [1] MODERATE difficulty breathing (e.g., speaks in phrases, SOB even at rest, pulse 100-120) AND [2] still present when not coughing  Answer Assessment - Initial Assessment Questions 1. ONSET: "When did the cough begin?"      Started 3 days  2. SEVERITY: "How bad is the cough today?"      Not as bas yesterday  3. SPUTUM: "Describe the color of your sputum" (none, dry cough; clear, white, yellow, green)     Dry cough   4. HEMOPTYSIS: "Are you coughing up any blood?" If so ask: "How much?" (flecks, streaks, tablespoons, etc.)     No blood  5. DIFFICULTY BREATHING: "Are you having difficulty breathing?" If Yes, ask: "How bad is it?" (e.g., mild, moderate, severe)    - MILD: No SOB at rest, mild SOB with walking, speaks normally in sentences, can lie down, no retractions, pulse < 100.    - MODERATE: SOB at rest, SOB with minimal exertion and prefers to sit, cannot lie down flat, speaks in  phrases, mild retractions, audible wheezing, pulse 100-120.    - SEVERE: Very SOB at rest, speaks in single words, struggling to breathe, sitting hunched forward, retractions, pulse > 120      Moderate shortness of breath  6. FEVER: "Do you have a fever?" If Yes, ask: "What is your temperature, how was it measured, and when did it start?"     No fever  7. CARDIAC HISTORY: "Do you have any history of heart disease?" (e.g., heart attack, congestive heart failure)     CHF  8. LUNG HISTORY: "Do you have any history of lung disease?"  (e.g., pulmonary embolus, asthma, emphysema)     Pulmonary edema  9. PE RISK FACTORS: "Do you have a history of blood clots?" (or: recent major surgery, recent prolonged travel, bedridden)     No blood clots  10. OTHER SYMPTOMS: "Do you have any other symptoms?" (e.g., runny nose, wheezing, chest pain)       Rattling in chest when exhale, runny nose, wheezing, cough  11. PREGNANCY: "Is there any chance you are pregnant?" "When was your last menstrual period?"       N/a  12. TRAVEL: "Have you traveled out of the country in the last month?" (e.g., travel history, exposures)       No  Protocols used: Cough - Acute Non-Productive-A-AH

## 2023-11-27 ENCOUNTER — Observation Stay (HOSPITAL_COMMUNITY)
Admission: EM | Admit: 2023-11-27 | Discharge: 2023-11-28 | Disposition: A | Discharge status: Home / Self Care | Payer: Medicare Other | Attending: Family Medicine | Admitting: Family Medicine

## 2023-11-27 ENCOUNTER — Emergency Department (HOSPITAL_COMMUNITY): Payer: Medicare Other

## 2023-11-27 DIAGNOSIS — I5043 Acute on chronic combined systolic (congestive) and diastolic (congestive) heart failure: Secondary | ICD-10-CM

## 2023-11-27 DIAGNOSIS — Z87891 Personal history of nicotine dependence: Secondary | ICD-10-CM | POA: Diagnosis not present

## 2023-11-27 DIAGNOSIS — C851A Unspecified b-cell lymphoma, in remission: Secondary | ICD-10-CM | POA: Diagnosis not present

## 2023-11-27 DIAGNOSIS — Z20822 Contact with and (suspected) exposure to covid-19: Secondary | ICD-10-CM | POA: Diagnosis not present

## 2023-11-27 DIAGNOSIS — R0602 Shortness of breath: Secondary | ICD-10-CM | POA: Diagnosis present

## 2023-11-27 DIAGNOSIS — Z79899 Other long term (current) drug therapy: Secondary | ICD-10-CM | POA: Insufficient documentation

## 2023-11-27 DIAGNOSIS — F1920 Other psychoactive substance dependence, uncomplicated: Secondary | ICD-10-CM | POA: Insufficient documentation

## 2023-11-27 DIAGNOSIS — I10 Essential (primary) hypertension: Secondary | ICD-10-CM | POA: Diagnosis not present

## 2023-11-27 DIAGNOSIS — B338 Other specified viral diseases: Secondary | ICD-10-CM | POA: Diagnosis present

## 2023-11-27 DIAGNOSIS — J9601 Acute respiratory failure with hypoxia: Principal | ICD-10-CM | POA: Insufficient documentation

## 2023-11-27 DIAGNOSIS — N182 Chronic kidney disease, stage 2 (mild): Secondary | ICD-10-CM | POA: Insufficient documentation

## 2023-11-27 DIAGNOSIS — J210 Acute bronchiolitis due to respiratory syncytial virus: Secondary | ICD-10-CM | POA: Diagnosis not present

## 2023-11-27 DIAGNOSIS — I422 Other hypertrophic cardiomyopathy: Secondary | ICD-10-CM | POA: Insufficient documentation

## 2023-11-27 DIAGNOSIS — R778 Other specified abnormalities of plasma proteins: Secondary | ICD-10-CM | POA: Diagnosis not present

## 2023-11-27 DIAGNOSIS — F1410 Cocaine abuse, uncomplicated: Secondary | ICD-10-CM | POA: Insufficient documentation

## 2023-11-27 DIAGNOSIS — R0902 Hypoxemia: Secondary | ICD-10-CM

## 2023-11-27 DIAGNOSIS — I509 Heart failure, unspecified: Secondary | ICD-10-CM

## 2023-11-27 DIAGNOSIS — Z21 Asymptomatic human immunodeficiency virus [HIV] infection status: Secondary | ICD-10-CM | POA: Insufficient documentation

## 2023-11-27 DIAGNOSIS — I5022 Chronic systolic (congestive) heart failure: Secondary | ICD-10-CM | POA: Insufficient documentation

## 2023-11-27 DIAGNOSIS — I130 Hypertensive heart and chronic kidney disease with heart failure and stage 1 through stage 4 chronic kidney disease, or unspecified chronic kidney disease: Secondary | ICD-10-CM | POA: Diagnosis not present

## 2023-11-27 LAB — BRAIN NATRIURETIC PEPTIDE: B Natriuretic Peptide: 1126 pg/mL — ABNORMAL HIGH (ref 0.0–100.0)

## 2023-11-27 LAB — CBC WITH DIFFERENTIAL/PLATELET
Abs Immature Granulocytes: 0.02 10*3/uL (ref 0.00–0.07)
Basophils Absolute: 0 10*3/uL (ref 0.0–0.1)
Basophils Relative: 1 %
Eosinophils Absolute: 0 10*3/uL (ref 0.0–0.5)
Eosinophils Relative: 0 %
HCT: 46.6 % (ref 39.0–52.0)
Hemoglobin: 15.4 g/dL (ref 13.0–17.0)
Immature Granulocytes: 0 %
Lymphocytes Relative: 19 %
Lymphs Abs: 1.2 10*3/uL (ref 0.7–4.0)
MCH: 30.2 pg (ref 26.0–34.0)
MCHC: 33 g/dL (ref 30.0–36.0)
MCV: 91.4 fL (ref 80.0–100.0)
Monocytes Absolute: 0.8 10*3/uL (ref 0.1–1.0)
Monocytes Relative: 12 %
Neutro Abs: 4.1 10*3/uL (ref 1.7–7.7)
Neutrophils Relative %: 68 %
Platelets: 218 10*3/uL (ref 150–400)
RBC: 5.1 MIL/uL (ref 4.22–5.81)
RDW: 14.8 % (ref 11.5–15.5)
WBC: 6.1 10*3/uL (ref 4.0–10.5)
nRBC: 0 % (ref 0.0–0.2)

## 2023-11-27 LAB — BASIC METABOLIC PANEL
Anion gap: 13 (ref 5–15)
BUN: 32 mg/dL — ABNORMAL HIGH (ref 6–20)
CO2: 23 mmol/L (ref 22–32)
Calcium: 9.3 mg/dL (ref 8.9–10.3)
Chloride: 101 mmol/L (ref 98–111)
Creatinine, Ser: 1.66 mg/dL — ABNORMAL HIGH (ref 0.61–1.24)
GFR, Estimated: 53 mL/min — ABNORMAL LOW (ref >60)
Glucose, Bld: 116 mg/dL — ABNORMAL HIGH (ref 70–99)
Potassium: 3.6 mmol/L (ref 3.5–5.1)
Sodium: 137 mmol/L (ref 135–145)

## 2023-11-27 LAB — RAPID URINE DRUG SCREEN, HOSP PERFORMED
Amphetamines: NOT DETECTED
Barbiturates: NOT DETECTED
Benzodiazepines: NOT DETECTED
Cocaine: POSITIVE — AB
Opiates: NOT DETECTED
Tetrahydrocannabinol: POSITIVE — AB

## 2023-11-27 LAB — RESP PANEL BY RT-PCR (RSV, FLU A&B, COVID)  RVPGX2
Influenza A by PCR: NEGATIVE
Influenza B by PCR: NEGATIVE
Resp Syncytial Virus by PCR: POSITIVE — AB
SARS Coronavirus 2 by RT PCR: NEGATIVE

## 2023-11-27 LAB — TROPONIN I (HIGH SENSITIVITY)
Troponin I (High Sensitivity): 188 ng/L (ref <18)
Troponin I (High Sensitivity): 175 ng/L (ref <18)

## 2023-11-27 MED ORDER — IPRATROPIUM-ALBUTEROL 0.5-2.5 (3) MG/3ML IN SOLN
3.0000 mL | Freq: Four times a day (QID) | RESPIRATORY_TRACT | Status: DC
  Administered 2023-11-27 – 2023-11-28 (×2): 3 mL via RESPIRATORY_TRACT
  Filled 2023-11-27 (×3): qty 3

## 2023-11-27 MED ORDER — HYDRALAZINE HCL 50 MG PO TABS
50.0000 mg | ORAL_TABLET | Freq: Three times a day (TID) | ORAL | Status: DC
  Administered 2023-11-28: 50 mg via ORAL
  Filled 2023-11-27: qty 1

## 2023-11-27 MED ORDER — ACETAMINOPHEN 650 MG RE SUPP: 650.0000 mg | Freq: Four times a day (QID) | RECTAL | Status: DC | PRN

## 2023-11-27 MED ORDER — PANTOPRAZOLE SODIUM 40 MG PO TBEC
40.0000 mg | DELAYED_RELEASE_TABLET | Freq: Every day | ORAL | Status: DC
  Administered 2023-11-28: 40 mg via ORAL
  Filled 2023-11-27: qty 1

## 2023-11-27 MED ORDER — ALBUTEROL SULFATE (2.5 MG/3ML) 0.083% IN NEBU
2.5000 mg | INHALATION_SOLUTION | RESPIRATORY_TRACT | Status: DC | PRN
Start: 2023-11-27 — End: 2023-11-28

## 2023-11-27 MED ORDER — ALBUTEROL SULFATE (2.5 MG/3ML) 0.083% IN NEBU
10.0000 mg | INHALATION_SOLUTION | Freq: Once | RESPIRATORY_TRACT | Status: AC
Start: 2023-11-27 — End: 2023-11-27
  Administered 2023-11-27: 10 mg via RESPIRATORY_TRACT
  Filled 2023-11-27: qty 12

## 2023-11-27 MED ORDER — IPRATROPIUM-ALBUTEROL 0.5-2.5 (3) MG/3ML IN SOLN
3.0000 mL | Freq: Once | RESPIRATORY_TRACT | Status: AC
  Administered 2023-11-27: 3 mL via RESPIRATORY_TRACT
  Filled 2023-11-27: qty 3

## 2023-11-27 MED ORDER — FUROSEMIDE 40 MG PO TABS
80.0000 mg | ORAL_TABLET | Freq: Every day | ORAL | Status: DC
  Administered 2023-11-28: 80 mg via ORAL
  Filled 2023-11-27: qty 2

## 2023-11-27 MED ORDER — CARVEDILOL 12.5 MG PO TABS
25.0000 mg | ORAL_TABLET | Freq: Two times a day (BID) | ORAL | Status: DC
  Administered 2023-11-28: 25 mg via ORAL
  Filled 2023-11-27: qty 2

## 2023-11-27 MED ORDER — CARVEDILOL 3.125 MG PO TABS: 6.2500 mg | ORAL_TABLET | Freq: Two times a day (BID) | ORAL | Status: DC

## 2023-11-27 MED ORDER — DEXAMETHASONE SODIUM PHOSPHATE 10 MG/ML IJ SOLN
10.0000 mg | Freq: Once | INTRAMUSCULAR | Status: AC
  Administered 2023-11-27: 10 mg via INTRAVENOUS
  Filled 2023-11-27: qty 1

## 2023-11-27 MED ORDER — ISOSORBIDE DINITRATE 20 MG PO TABS
10.0000 mg | ORAL_TABLET | Freq: Three times a day (TID) | ORAL | Status: DC
  Administered 2023-11-27 – 2023-11-28 (×2): 10 mg via ORAL
  Filled 2023-11-27 (×2): qty 1

## 2023-11-27 MED ORDER — HEPARIN SODIUM (PORCINE) 5000 UNIT/ML IJ SOLN
5000.0000 [IU] | Freq: Three times a day (TID) | INTRAMUSCULAR | Status: DC
  Administered 2023-11-28: 5000 [IU] via SUBCUTANEOUS
  Filled 2023-11-27: qty 1

## 2023-11-27 MED ORDER — ACETAMINOPHEN 325 MG PO TABS: 650.0000 mg | ORAL_TABLET | Freq: Four times a day (QID) | ORAL | Status: DC | PRN

## 2023-11-27 MED ORDER — BICTEGRAVIR-EMTRICITAB-TENOFOV 50-200-25 MG PO TABS
1.0000 | ORAL_TABLET | Freq: Every day | ORAL | Status: DC
  Filled 2023-11-27 (×2): qty 1

## 2023-11-27 MED ORDER — METHYLPREDNISOLONE SODIUM SUCC 125 MG IJ SOLR
125.0000 mg | Freq: Every day | INTRAMUSCULAR | Status: DC
  Filled 2023-11-27: qty 2

## 2023-11-27 MED ORDER — ACETAMINOPHEN 500 MG PO TABS
1000.0000 mg | ORAL_TABLET | Freq: Once | ORAL | Status: AC
  Administered 2023-11-27: 1000 mg via ORAL
  Filled 2023-11-27: qty 2

## 2023-11-27 MED ORDER — HYDROCODONE-ACETAMINOPHEN 5-325 MG PO TABS: 1.0000 | ORAL_TABLET | ORAL | Status: DC | PRN

## 2023-11-27 MED ORDER — SACUBITRIL-VALSARTAN 24-26 MG PO TABS
1.0000 | ORAL_TABLET | Freq: Two times a day (BID) | ORAL | Status: DC
Start: 2023-11-28 — End: 2023-11-28
  Administered 2023-11-28: 1 via ORAL
  Filled 2023-11-27: qty 1

## 2023-11-27 NOTE — H&P (Signed)
TRH H&P   Patient Demographics:    Richard Cox, is a 43 y.o. male  MRN: [id number]   DOB - 1981/02/05  Admit Date - 11/27/2023  Outpatient Primary MD for the patient is Del Newman Nip, Tenna Child, FNP  Referring MD/NP/PA: PA Idol  Patient coming from: home  Chief Complaint  Patient presents with   Shortness of Breath      HPI:    Richard Cox  is a 43 y.o. male,  with past medical history significant for hypertrophic cardiomyopathy, chronic systolic congestive heart failure, HTN, HIV, history of B-cell lymphoma, CKD stage II, polysubstance abuse with cocaine, medical noncompliance with recent hospitalization in October 2024, due to pulmonary edema, secondary to medication noncompliance and cocaine use, for which she required intubation .  He recovered, discharged home on room air. -Patient presents to ED secondary to complaints of cough, shortness of breath, and fever, patient reports acute illness for last 4 days,, cough with clear phlegm, nasal congestion, runny nose, as well diarrhea this morning, he does report skeletal chest pain, related to cough, does endorse compliant with his medications, denies any lower extremity edema or fluid retention. -In ED patient was noted to be febrile 101.8, tachycardic at 103, blood pressure has been stable, labs significant for stable creatinine at 1.66, potassium at 3.6, BNP elevated at 1126, but this is lower than his baseline,Troponin elevated at 188, repeat came lower at 175, as troponin elevated at baseline, he had significant wheezing and tachypnea initially, RSV came back positive, he was treated with steroids, DuoNebs, with significant improvement of his symptoms, [facility] hospitalist consulted to admit.     Review of systems:     A full 10 point Review of Systems was done, except as stated above, all other Review of Systems were  negative.   With Past History of the following :    Past Medical History:  Diagnosis Date   Acute respiratory failure with hypoxia (HCC) 08/24/2021   Anxiety    Chronic HFrEF (heart failure with reduced ejection fraction) (HCC)    CKD (chronic kidney disease) stage 2, GFR 60-89 ml/min    Essential hypertension    HIV infection (HCC)    Hypertrophic cardiomegaly    Lymphoma (HCC) 08/28/2020   Noncompliance with medication regimen    Polysubstance abuse (HCC)    Secondary cardiomyopathy (HCC)       Past Surgical History:  Procedure Laterality Date   IR IMAGING GUIDED PORT INSERTION  08/23/2020   Right   NO PAST SURGERIES        Social History:     Social History   Tobacco Use   Smoking status: Former    Current packs/day: 0.25    Average packs/day: 0.3 packs/day for 21.0 years (5.3 ttl pk-yrs)    Types: Cigarettes    Start date: 2004   Smokeless tobacco: Never  Substance Use  Topics   Alcohol use: Not Currently        Family History :     Family History  Problem Relation Age of Onset   Diabetes Mellitus II Mother    Cancer Mother        multiple myeloma   Cancer Father        multiple myeloma   Congestive Heart Failure Brother    Heart Problems Brother    Lupus Brother       Home Medications:   Prior to Admission medications   Medication Sig Start Date End Date Taking? Authorizing Provider  acetaminophen (TYLENOL) 500 MG tablet Take 1,500 mg by mouth as needed for moderate pain (pain score 4-6), headache or mild pain (pain score 1-3).   Yes [provider]  bictegravir-emtricitabine-tenofovir AF (BIKTARVY) 50-200-25 MG TABS tablet Take 1 tablet by mouth daily. 08/31/23 11/29/23 Yes [place], Eric J, DO  carvedilol (COREG) 25 MG tablet Take 1 tablet (25 mg total) by mouth 2 (two) times daily with a meal. 08/31/23 11/29/23 Yes [place], Eric J, DO  furosemide (LASIX) 80 MG tablet Take 1 tablet (80 mg total) by mouth daily. 11/01/23 01/30/24 Yes Del  Nigel Berthold, FNP  hydrALAZINE (APRESOLINE) 25 MG tablet Take 2 tablets (50 mg total) by mouth every 8 (eight) hours. 08/31/23 11/29/23 Yes [place], Eric J, DO  isosorbide dinitrate (ISORDIL) 10 MG tablet Take 1 tablet (10 mg total) by mouth 3 (three) times daily. 08/31/23 11/29/23 Yes [place], Eric J, DO  sacubitril-valsartan (ENTRESTO) 24-26 MG Take 1 tablet by mouth 2 (two) times daily. 08/31/23 11/29/23 Yes [place], Eric J, DO  cyclobenzaprine (FLEXERIL) 5 MG tablet Take 1 tablet (5 mg total) by mouth 3 (three) times daily as needed for muscle spasms. 09/01/23   Del Nigel Berthold, FNP  escitalopram (LEXAPRO) 10 MG tablet Take 1 tablet (10 mg total) by mouth daily. Patient not taking: Reported on 11/27/2023 11/01/23   Del Nigel Berthold, FNP  hydrOXYzine (VISTARIL) 25 MG capsule Take 1 capsule (25 mg total) by mouth every 8 (eight) hours as needed for anxiety. Take 1 tablet at bedtime Patient not taking: Reported on 11/27/2023 11/01/23   Del Nigel Berthold, FNP  spironolactone (ALDACTONE) 25 MG tablet Take 1 tablet (25 mg total) by mouth daily. Patient not taking: Reported on 11/27/2023 09/01/23 11/30/23  [place], Eric J, DO     Allergies:     Allergies  Allergen Reactions   Motrin [Ibuprofen] Hypertension   Neosporin [Neomycin-Bacitracin Zn-Polymyx] Rash     Physical Exam:   Vitals  Blood pressure 114/84, pulse 99, temperature 99.1 F (37.3 C), temperature source Oral, resp. rate 19, height 6\' 1"  (1.854 m), weight 68.5 kg, SpO2 96%.   1. General Frail thin appearing male, laying in bed, no apparent distress  2. Normal affect and insight, Not Suicidal or Homicidal, Awake Alert, Oriented X 3.  3. No F.N deficits, ALL C.Nerves Intact, Strength 5/5 all 4 extremities, Sensation intact all 4 extremities, Plantars down going.  4. Ears and Eyes appear Normal, Conjunctivae clear, PERRLA. Moist Oral Mucosa.  5. Supple Neck, No JVD, No cervical lymphadenopathy  appriciated, No Carotid Bruits.  6. Symmetrical Chest wall movement, Good air movement bilaterally, scattered wheezing  7. RRR, No Gallops, Rubs or Murmurs, No Parasternal Heave.  8. Positive Bowel Sounds, Abdomen Soft, No tenderness, No organomegaly appriciated,No rebound -guarding or rigidity.  9.  No Cyanosis, Normal Skin Turgor, No Skin Rash or Bruise.  10. Good muscle tone,  joints appear normal , no effusions, Normal ROM.    Data Review:    CBC Recent Labs  Lab 11/27/23 1143  WBC 6.1  HGB 15.4  HCT 46.6  PLT 218  MCV 91.4  MCH 30.2  MCHC 33.0  RDW 14.8  LYMPHSABS 1.2  MONOABS 0.8  EOSABS 0.0  BASOSABS 0.0   ------------------------------------------------------------------------------------------------------------------  Chemistries  Recent Labs  Lab 11/27/23 1143  NA 137  K 3.6  CL 101  CO2 23  GLUCOSE 116*  BUN 32*  CREATININE 1.66*  CALCIUM 9.3   ------------------------------------------------------------------------------------------------------------------ estimated creatinine clearance is 57.3 mL/min (A) (by C-G formula based on SCr of 1.66 mg/dL (H)). ------------------------------------------------------------------------------------------------------------------ No results for input(s): "TSH", "T4TOTAL", "T3FREE", "THYROIDAB" in the last 72 hours.  Invalid input(s): "FREET3"  Coagulation profile No results for input(s): "INR", "PROTIME" in the last 168 hours. ------------------------------------------------------------------------------------------------------------------- No results for input(s): "DDIMER" in the last 72 hours. -------------------------------------------------------------------------------------------------------------------  Cardiac Enzymes No results for input(s): "CKMB", "TROPONINI", "MYOGLOBIN" in the last 168 hours.  Invalid input(s):  "CK" ------------------------------------------------------------------------------------------------------------------    Component Value Date/Time   BNP 1,126.0 (H) 11/27/2023 1143     ---------------------------------------------------------------------------------------------------------------  Urinalysis    Component Value Date/Time   COLORURINE YELLOW 08/30/2022 0625   APPEARANCEUR CLEAR 08/30/2022 0625   LABSPEC 1.016 08/30/2022 0625   PHURINE 5.0 08/30/2022 0625   GLUCOSEU NEGATIVE 08/30/2022 0625   HGBUR SMALL (A) 08/30/2022 0625   BILIRUBINUR NEGATIVE 08/30/2022 0625   KETONESUR NEGATIVE 08/30/2022 0625   PROTEINUR 100 (A) 08/30/2022 0625   NITRITE NEGATIVE 08/30/2022 0625   LEUKOCYTESUR NEGATIVE 08/30/2022 0625    ----------------------------------------------------------------------------------------------------------------   Imaging Results:    DG Chest 2 View Result Date: 11/27/2023 CLINICAL DATA:  Shortness of breath, cough EXAM: CHEST - 2 VIEW COMPARISON:  Chest radiograph dated 08/28/2023. FINDINGS: A right internal jugular central venous port catheter tip overlies the superior vena cava. The heart size is enlarged. Both lungs are clear. The visualized skeletal structures are unremarkable. IMPRESSION: No active cardiopulmonary disease. Electronically Signed   By: Romona Curls M.D.   On: 11/27/2023 12:29    EKG:    Assessment & Plan:    Principal Problem:   Acute respiratory failure with hypoxia (HCC) Active Problems:   HTN (hypertension)   HIV (human immunodeficiency virus infection) (HCC)   CHF (congestive heart failure) (HCC)   Cocaine abuse (HCC)   RSV infection  RSV bronchitis with reactive airway disease -Patient presents with fever, chills, dyspnea, he had significant wheezing on presentation. -RSV is positive -No evidence of pneumonia on imaging, no indication for antibiotics -Given respiratory distress increased work of breathing, he  received steroids, with nebulizer treatment with significant improvement -Continue with IV steroids, scheduled DuoNebs and as needed albuterol -Encouraged use incentive spirometer  chronic systolic congestive heart failure History of hypertrophic cardiomyopathy Hypertension -Patient with known history of chronic systolic CHF, EF 25 to 30% and 2D echo April 2024 -BNP significantly elevated at 1126, this is much lower than his baseline which is usually around > 2000 -CHF appears to be compensated, euvolemic currently. -Continue with home medication including Entresto, Coreg, hydralazine and isosorbide dinitrate, will put start date from tomorrow given soft blood pressure today  Elevated troponins -Multi factorial, secondary to baseline CKD, as well likely due to cocaine use, it is around his baseline, EKG nonacute   CKD stage II -Creatinine at baseline, continue to monitor   HIV Continue Biktarvy.  Outpatient follow-up with infectious disease  B cell lymphoma in remission Outpatient follow-up with medical oncology   Polysubstance use UDS positive for THC, and cocaine, he was counseled again, he has longstanding history of cocaine abuse    DVT Prophylaxis Heparin   AM Labs Ordered, also please review Full Orders  Family Communication: Admission, patients condition and plan of care including tests being ordered have been discussed with the patient  who indicate understanding and agree with the plan and Code Status.  Code Status full code  Likely DC to home  Consults called: None  Admission status: Inpatient  Time spent in minutes : 70 minutes   Huey Bienenstock M.D on 11/27/2023 at 7:14 PM   [facility] - Office  [phone]

## 2023-11-27 NOTE — ED Provider Notes (Cosign Needed Addendum)
 [name] Provider Note   CSN: [id number] Arrival date & time: 11/27/23  1108     History  Chief Complaint  Patient presents with   Shortness of Breath    Richard Cox is a 43 y.o. male with a history significant for HIV compliant with his Biktarvy, history of chronic kidney disease, CHF, hypertension, presenting for evaluation of increasing shortness of breath over the past 4 days along with a cough which is productive of clear sputum, nasal congestion with clear rhinorrhea and also developed diarrhea x 1 this morning.  He reports left-sided chest pain that is triggered by coughing, better at rest.  He does not currently have any fluid retention in his lower extremities, states he has had edema recently but is better today after taking furosemide.  He endorses orthopnea but also worsening shortness of breath with exertion.  He denies fevers, nausea or vomiting.  His last cocaine use was 4 days ago.  The history is provided by the patient.       Home Medications Prior to Admission medications   Medication Sig Start Date End Date Taking? Authorizing Provider  acetaminophen (TYLENOL) 500 MG tablet Take 1,500 mg by mouth as needed for moderate pain (pain score 4-6), headache or mild pain (pain score 1-3).    [provider]  bictegravir-emtricitabine-tenofovir AF (BIKTARVY) 50-200-25 MG TABS tablet Take 1 tablet by mouth daily. 08/31/23 11/29/23  [place], Alvira Philips, DO  carvedilol (COREG) 25 MG tablet Take 1 tablet (25 mg total) by mouth 2 (two) times daily with a meal. 08/31/23 11/29/23  [place], Alvira Philips, DO  cyclobenzaprine (FLEXERIL) 5 MG tablet Take 1 tablet (5 mg total) by mouth 3 (three) times daily as needed for muscle spasms. 09/01/23   Del Nigel Berthold, FNP  escitalopram (LEXAPRO) 10 MG tablet Take 1 tablet (10 mg total) by mouth daily. 11/01/23   Del Nigel Berthold, FNP  furosemide (LASIX) 80 MG tablet Take 1 tablet  (80 mg total) by mouth daily. 11/01/23 01/30/24  Del Nigel Berthold, FNP  hydrALAZINE (APRESOLINE) 25 MG tablet Take 2 tablets (50 mg total) by mouth every 8 (eight) hours. 08/31/23 11/29/23  [place], Eric J, DO  hydrOXYzine (VISTARIL) 25 MG capsule Take 1 capsule (25 mg total) by mouth every 8 (eight) hours as needed for anxiety. Take 1 tablet at bedtime 11/01/23   Del Newman Nip, Garfield, FNP  isosorbide dinitrate (ISORDIL) 10 MG tablet Take 1 tablet (10 mg total) by mouth 3 (three) times daily. 08/31/23 11/29/23  [place], Eric J, DO  sacubitril-valsartan (ENTRESTO) 24-26 MG Take 1 tablet by mouth 2 (two) times daily. 08/31/23 11/29/23  [place], Eric J, DO  spironolactone (ALDACTONE) 25 MG tablet Take 1 tablet (25 mg total) by mouth daily. 09/01/23 11/30/23  [place], Eric J, DO      Allergies    Motrin [ibuprofen] and Neosporin [neomycin-bacitracin zn-polymyx]    Review of Systems   Review of Systems  Constitutional:  Negative for fever.  HENT:  Positive for congestion and rhinorrhea. Negative for sore throat.   Eyes: Negative.   Respiratory:  Positive for cough, shortness of breath and wheezing. Negative for chest tightness.   Cardiovascular:  Positive for chest pain.  Gastrointestinal:  Positive for diarrhea. Negative for abdominal pain, nausea and vomiting.  Genitourinary: Negative.   Musculoskeletal:  Negative for arthralgias, joint swelling and neck pain.  Skin: Negative.  Negative for rash and wound.  Neurological:  Negative for dizziness, weakness, light-headedness, numbness and headaches.  Psychiatric/Behavioral: Negative.      Physical Exam Updated Vital Signs BP (!) 136/102   Pulse 99   Temp 99.1 F (37.3 C) (Oral)   Resp (!) 21   SpO2 96%  Physical Exam Vitals and nursing note reviewed.  Constitutional:      Appearance: He is not toxic-appearing.  HENT:     Head: Normocephalic and atraumatic.     Mouth/Throat:     Mouth: Mucous membranes are moist.      Pharynx: Oropharynx is clear.  Eyes:     Conjunctiva/sclera: Conjunctivae normal.  Cardiovascular:     Rate and Rhythm: Normal rate and regular rhythm.     Heart sounds: Normal heart sounds.  Pulmonary:     Effort: Pulmonary effort is normal.     Breath sounds: Decreased breath sounds and wheezing present. No rales.  Abdominal:     General: Bowel sounds are normal.     Palpations: Abdomen is soft.     Tenderness: There is no abdominal tenderness.  Musculoskeletal:        General: Normal range of motion.     Cervical back: Normal range of motion.     Right lower leg: No tenderness. No edema.     Left lower leg: No tenderness. No edema.  Skin:    General: Skin is warm and dry.  Neurological:     Mental Status: He is alert.     ED Results / Procedures / Treatments   Labs (all labs ordered are listed, but only abnormal results are displayed) Labs Reviewed  RESP PANEL BY RT-PCR (RSV, FLU A&B, COVID)  RVPGX2 - Abnormal; Notable for the following components:      Result Value   Resp Syncytial Virus by PCR POSITIVE (*)    All other components within normal limits  BASIC METABOLIC PANEL - Abnormal; Notable for the following components:   Glucose, Bld 116 (*)    BUN 32 (*)    Creatinine, Ser 1.66 (*)    GFR, Estimated 53 (*)    All other components within normal limits  BRAIN NATRIURETIC PEPTIDE - Abnormal; Notable for the following components:   B Natriuretic Peptide 1,126.0 (*)    All other components within normal limits  TROPONIN I (HIGH SENSITIVITY) - Abnormal; Notable for the following components:   Troponin I (High Sensitivity) 188 (*)    All other components within normal limits  TROPONIN I (HIGH SENSITIVITY) - Abnormal; Notable for the following components:   Troponin I (High Sensitivity) 175 (*)    All other components within normal limits  CBC WITH DIFFERENTIAL/PLATELET    EKG EKG Interpretation Date/Time:  Saturday November 27 2023 11:26:43 EST Ventricular Rate:   94 PR Interval:  171 QRS Duration:  107 QT Interval:  412 QTC Calculation: 513 R Axis:   68  Text Interpretation: Sinus rhythm Biatrial enlargement Probable lateral infarct, age indeterminate Anterior infarct, acute (LAD) Prolonged QT interval >>> Acute MI <<< Similar to 08/28/23 Confirmed by Anders Simmonds [phone]) on 11/27/2023 2:36:23 PM  Radiology DG Chest 2 View Result Date: 11/27/2023 CLINICAL DATA:  Shortness of breath, cough EXAM: CHEST - 2 VIEW COMPARISON:  Chest radiograph dated 08/28/2023. FINDINGS: A right internal jugular central venous port catheter tip overlies the superior vena cava. The heart size is enlarged. Both lungs are clear. The visualized skeletal structures are unremarkable. IMPRESSION: No active cardiopulmonary disease. Electronically Signed   By: Romona Curls  M.D.   On: 11/27/2023 12:29    Procedures Procedures    Medications Ordered in ED Medications  ipratropium-albuterol (DUONEB) 0.5-2.5 (3) MG/3ML nebulizer solution 3 mL (3 mLs Nebulization Given 11/27/23 1143)  dexamethasone (DECADRON) injection 10 mg (10 mg Intravenous Given 11/27/23 1444)  albuterol (PROVENTIL) (2.5 MG/3ML) 0.083% nebulizer solution 10 mg (10 mg Nebulization Given 11/27/23 1446)  acetaminophen (TYLENOL) tablet 1,000 mg (1,000 mg Oral Given 11/27/23 1526)    ED Course/ Medical Decision Making/ A&P                                 Medical Decision Making Pt presenting with significant cough,  wheezing on exam along with left sided chest pain triggered by cough,  afebrile prior to arrival , but has had some chills since arriving here.  HIV,  compliant with his antiviral meds.  Last CD4 count 7/24 over 500.  Sx and history concerning for viral vs bacterial respiratory infection,  pneumonia, flu, covid, rsv.  VS stable initially,  developed fever to 101.8 which improved with tylenol.  Respiratory distress with desaturation to 86% resting, although his wheezing is improved after multiple neb  tx.  RSV positive.  CXR negative for pneumonia.  Given pt's comorbidities,  hypoxia with RSV,  admission indicated.  Amount and/or Complexity of Data Reviewed Labs: ordered.    Details: BNP elevated but better then historical BNP's - no evidence of chf on exam or cxr.  Troponin elevated at 188 - chronic elevation between 147-188 over past 6 months.  He does use cocaine,  last used 4 days ago,  delta trop negative with second trop 175.   Respiratory panel positive for RSV. Creatinine elevated at 1.66,  relatively stable.  Radiology: ordered.    Details: No active cardiopulmonary disease. ECG/medicine tests: ordered.    Details: Biatrial enlargement, prolonged QT.  No significant changes compared to 08/28/23 Discussion of management or test interpretation with external provider(s): Pt discussed with Dr. Randol Kern who accepts pt for admission.   Risk OTC drugs. Prescription drug management. Decision regarding hospitalization.     CRITICAL CARE Performed by: Burgess Amor Total critical care time: 40 minutes Critical care time was exclusive of separately billable procedures and treating other patients. Critical care was necessary to treat or prevent imminent or life-threatening deterioration. Critical care was time spent personally by me on the following activities: development of treatment plan with patient and/or surrogate as well as nursing, discussions with consultants, evaluation of patient's response to treatment, examination of patient, obtaining history from patient or surrogate, ordering and performing treatments and interventions, ordering and review of laboratory studies, ordering and review of radiographic studies, pulse oximetry and re-evaluation of patient's condition.       Final Clinical Impression(s) / ED Diagnoses Final diagnoses:  RSV (acute bronchiolitis due to respiratory syncytial virus)  Hypoxia    Rx / DC Orders ED Discharge Orders     None          Victoriano Lain 11/27/23 1755    Arletha Pili, DO 11/28/23 0805    Burgess Amor, PA-C 01/19/24 1909    Anders Simmonds T, DO 01/20/24 [phone]

## 2023-11-27 NOTE — ED Triage Notes (Signed)
Pt reports worsening DOE for the last four days along with cough, congestion, runny nose, diarrhea. Pt states he has L sided CP that worsens with coughing. Hx of CHF and respiratory issues. No appreciable swelling to his feet.

## 2023-11-28 DIAGNOSIS — J9601 Acute respiratory failure with hypoxia: Secondary | ICD-10-CM | POA: Diagnosis not present

## 2023-11-28 DIAGNOSIS — J210 Acute bronchiolitis due to respiratory syncytial virus: Secondary | ICD-10-CM | POA: Diagnosis not present

## 2023-11-28 DIAGNOSIS — B338 Other specified viral diseases: Secondary | ICD-10-CM | POA: Diagnosis not present

## 2023-11-28 LAB — BASIC METABOLIC PANEL
Anion gap: 13 (ref 5–15)
BUN: 34 mg/dL — ABNORMAL HIGH (ref 6–20)
CO2: 22 mmol/L (ref 22–32)
Calcium: 9 mg/dL (ref 8.9–10.3)
Chloride: 100 mmol/L (ref 98–111)
Creatinine, Ser: 1.52 mg/dL — ABNORMAL HIGH (ref 0.61–1.24)
GFR, Estimated: 59 mL/min — ABNORMAL LOW (ref >60)
Glucose, Bld: 153 mg/dL — ABNORMAL HIGH (ref 70–99)
Potassium: 3.2 mmol/L — ABNORMAL LOW (ref 3.5–5.1)
Sodium: 135 mmol/L (ref 135–145)

## 2023-11-28 LAB — CBC
HCT: 41.3 % (ref 39.0–52.0)
Hemoglobin: 13.5 g/dL (ref 13.0–17.0)
MCH: 29.9 pg (ref 26.0–34.0)
MCHC: 32.7 g/dL (ref 30.0–36.0)
MCV: 91.4 fL (ref 80.0–100.0)
Platelets: 166 10*3/uL (ref 150–400)
RBC: 4.52 MIL/uL (ref 4.22–5.81)
RDW: 14.6 % (ref 11.5–15.5)
WBC: 4.9 10*3/uL (ref 4.0–10.5)
nRBC: 0 % (ref 0.0–0.2)

## 2023-11-28 MED ORDER — METHYLPREDNISOLONE SODIUM SUCC 40 MG IJ SOLR
40.0000 mg | Freq: Every day | INTRAMUSCULAR | Status: DC
  Administered 2023-11-28: 40 mg via INTRAVENOUS

## 2023-11-28 MED ORDER — POTASSIUM CHLORIDE CRYS ER 20 MEQ PO TBCR
40.0000 meq | EXTENDED_RELEASE_TABLET | Freq: Once | ORAL | Status: AC
  Administered 2023-11-28: 40 meq via ORAL
  Filled 2023-11-28: qty 2

## 2023-11-28 MED ORDER — ALBUTEROL SULFATE HFA 108 (90 BASE) MCG/ACT IN AERS: 2.0000 | INHALATION_SPRAY | Freq: Four times a day (QID) | RESPIRATORY_TRACT | 2 refills | Status: DC | PRN

## 2023-11-28 MED ORDER — PREDNISONE 20 MG PO TABS: 20.0000 mg | ORAL_TABLET | Freq: Every day | ORAL | 0 refills | Status: AC

## 2023-11-28 NOTE — Progress Notes (Signed)
SATURATION QUALIFICATIONS: (This note is used to comply with regulatory documentation for home oxygen)  Patient Saturations on Room Air at Rest = 96%  Patient Saturations on Room Air while Ambulating = 92%  Patient Saturations on 0 Liters of oxygen while Ambulating = 92%  Please briefly explain why patient needs home oxygen: Patient does not need home oxygen at this time. O2 sat above 90%.

## 2023-11-28 NOTE — Hospital Course (Signed)
43 y.o. male,  with past medical history significant for hypertrophic cardiomyopathy, chronic systolic congestive heart failure, HTN, HIV, history of B-cell lymphoma, CKD stage II, polysubstance abuse with cocaine, medical noncompliance with recent hospitalization in October 2024, due to pulmonary edema, secondary to medication noncompliance and cocaine use, for which she required intubation .  He recovered, discharged home on room air. -Patient presents to ED secondary to complaints of cough, shortness of breath, and fever, patient reports acute illness for last 4 days,, cough with clear phlegm, nasal congestion, runny nose, as well diarrhea this morning, he does report skeletal chest pain, related to cough, does endorse compliant with his medications, denies any lower extremity edema or fluid retention. -In ED patient was noted to be febrile 101.8, tachycardic at 103, blood pressure has been stable, labs significant for stable creatinine at 1.66, potassium at 3.6, BNP elevated at 1126, but this is lower than his baseline,Troponin elevated at 188, repeat came lower at 175, as troponin elevated at baseline, he had significant wheezing and tachypnea initially, RSV came back positive, he was treated with steroids, DuoNebs, with significant improvement of his symptoms, [facility] hospitalist consulted to admit.

## 2023-11-28 NOTE — Care Management Obs Status (Signed)
MEDICARE OBSERVATION STATUS NOTIFICATION   Patient Details  Name: Richard Cox MRN: [id number] Date of Birth: [birth year]   Medicare Observation Status Notification Given:  Yes  I Johny Shock., LCSWA verbally reviewed observation notice, signature provided by Duanne Guess.   Villa Herb, LCSWA 11/28/2023, 11:14 AM

## 2023-11-28 NOTE — Care Management CC44 (Signed)
Condition Code 44 Documentation Completed  Patient Details  Name: Richard Cox MRN: [id number] Date of Birth: 18-Jan-1981   Condition Code 44 given:  Yes Patient signature on Condition Code 44 notice:  Yes Documentation of 2 MD's agreement:  Yes Code 44 added to claim:  Yes  I Johny Shock., LCSWA verbally reviewed observation notice, signature provided by Duanne Guess.   Villa Herb, LCSWA 11/28/2023, 11:14 AM

## 2023-11-28 NOTE — Discharge Summary (Addendum)
Physician Discharge Summary  Richard Cox ZOX:[id number] DOB: 1981/07/15 DOA: 11/27/2023  PCP: Rica Records, FNP  Admit date: 11/27/2023 Discharge date: 11/28/2023  Admitted From:  HOME  Disposition: HOME   Recommendations for Outpatient Follow-up:  Follow up with PCP in 1 weeks  Discharge Condition: STABLE   CODE STATUS: FULL  DIET: Heart healthy    Brief Hospitalization Summary: Please see all hospital notes, images, labs for full details of the hospitalization. Admission provider HPI:   43 y.o. male,  with past medical history significant for hypertrophic cardiomyopathy, chronic systolic congestive heart failure, HTN, HIV, history of B-cell lymphoma, CKD stage II, polysubstance abuse with cocaine, medical noncompliance with recent hospitalization in October 2024, due to pulmonary edema, secondary to medication noncompliance and cocaine use, for which she required intubation .  He recovered, discharged home on room air. -Patient presents to ED secondary to complaints of cough, shortness of breath, and fever, patient reports acute illness for last 4 days,, cough with clear phlegm, nasal congestion, runny nose, as well diarrhea this morning, he does report skeletal chest pain, related to cough, does endorse compliant with his medications, denies any lower extremity edema or fluid retention. -In ED patient was noted to be febrile 101.8, tachycardic at 103, blood pressure has been stable, labs significant for stable creatinine at 1.66, potassium at 3.6, BNP elevated at 1126, but this is lower than his baseline,Troponin elevated at 188, repeat came lower at 175, as troponin elevated at baseline, he had significant wheezing and tachypnea initially, RSV came back positive, he was treated with steroids, DuoNebs, with significant improvement of his symptoms, [facility] consulted to admit.  Hospital Course Pt was admitted for observation for adult RSV infection and fortunately responded  very well to IV steroid treatment and bronchodilators.  He is breathing much better and has no further shortness of breath, chest pain or cough symptoms.  He is discharging home in stable condition.  Hypokalemia repleted with oral potassium supplement.   Discharge Diagnoses:  Principal Problem:   Acute respiratory failure with hypoxia (HCC) Active Problems:   HTN (hypertension)   HIV (human immunodeficiency virus infection) (HCC)   CHF (congestive heart failure) (HCC)   Cocaine abuse (HCC)   RSV infection   Bronchiolitis due to respiratory syncytial virus (RSV)  Discharge Instructions:  Allergies as of 11/28/2023       Reactions   Motrin [ibuprofen] Hypertension   Neosporin [neomycin-bacitracin Zn-polymyx] Rash        Medication List     STOP taking these medications    acetaminophen 500 MG tablet Commonly known as: TYLENOL       TAKE these medications    albuterol 108 (90 Base) MCG/ACT inhaler Commonly known as: VENTOLIN HFA Inhale 2 puffs into the lungs every 6 (six) hours as needed for wheezing or shortness of breath.   Biktarvy 50-200-25 MG Tabs tablet Generic drug: bictegravir-emtricitabine-tenofovir AF Take 1 tablet by mouth daily.   carvedilol 25 MG tablet Commonly known as: COREG Take 1 tablet (25 mg total) by mouth 2 (two) times daily with a meal.   cyclobenzaprine 5 MG tablet Commonly known as: FLEXERIL Take 1 tablet (5 mg total) by mouth 3 (three) times daily as needed for muscle spasms.   Entresto 24-26 MG Generic drug: sacubitril-valsartan Take 1 tablet by mouth 2 (two) times daily.   furosemide 80 MG tablet Commonly known as: LASIX Take 1 tablet (80 mg total) by mouth daily.   hydrALAZINE 25 MG  tablet Commonly known as: APRESOLINE Take 2 tablets (50 mg total) by mouth every 8 (eight) hours.   isosorbide dinitrate 10 MG tablet Commonly known as: ISORDIL Take 1 tablet (10 mg total) by mouth 3 (three) times daily.   predniSONE 20 MG  tablet Commonly known as: DELTASONE Take 1 tablet (20 mg total) by mouth daily with breakfast for 3 days. Start taking on: November 29, 2023        Follow-up Information     Del Newman Nip, [place], [place]. Schedule an appointment as soon as possible for a visit in 1 week(s).   Specialty: Family Medicine Why: Hospital Follow Up Contact information: [address] [place] [address] [phone]                Allergies  Allergen Reactions   Motrin [Ibuprofen] Hypertension   Neosporin [Neomycin-Bacitracin Zn-Polymyx] Rash   Allergies as of 11/28/2023       Reactions   Motrin [ibuprofen] Hypertension   Neosporin [neomycin-bacitracin Zn-polymyx] Rash        Medication List     STOP taking these medications    acetaminophen 500 MG tablet Commonly known as: TYLENOL       TAKE these medications    albuterol 108 (90 Base) MCG/ACT inhaler Commonly known as: VENTOLIN HFA Inhale 2 puffs into the lungs every 6 (six) hours as needed for wheezing or shortness of breath.   Biktarvy 50-200-25 MG Tabs tablet Generic drug: bictegravir-emtricitabine-tenofovir AF Take 1 tablet by mouth daily.   carvedilol 25 MG tablet Commonly known as: COREG Take 1 tablet (25 mg total) by mouth 2 (two) times daily with a meal.   cyclobenzaprine 5 MG tablet Commonly known as: FLEXERIL Take 1 tablet (5 mg total) by mouth 3 (three) times daily as needed for muscle spasms.   Entresto 24-26 MG Generic drug: sacubitril-valsartan Take 1 tablet by mouth 2 (two) times daily.   furosemide 80 MG tablet Commonly known as: LASIX Take 1 tablet (80 mg total) by mouth daily.   hydrALAZINE 25 MG tablet Commonly known as: APRESOLINE Take 2 tablets (50 mg total) by mouth every 8 (eight) hours.   isosorbide dinitrate 10 MG tablet Commonly known as: ISORDIL Take 1 tablet (10 mg total) by mouth 3 (three) times daily.   predniSONE 20 MG tablet Commonly known as: DELTASONE Take 1  tablet (20 mg total) by mouth daily with breakfast for 3 days. Start taking on: November 29, 2023        Procedures/Studies: DG Chest 2 View Result Date: 11/27/2023 CLINICAL DATA:  Shortness of breath, cough EXAM: CHEST - 2 VIEW COMPARISON:  Chest radiograph dated 08/28/2023. FINDINGS: A right internal jugular central venous port catheter tip overlies the superior vena cava. The heart size is enlarged. Both lungs are clear. The visualized skeletal structures are unremarkable. IMPRESSION: No active cardiopulmonary disease. Electronically Signed   By: Romona Curls M.D.   On: 11/27/2023 12:29     Subjective: Pt says he is breathing much better today.  He is not wheezing or coughing.  No SOB or CP.  He is eager to go home.    Discharge Exam: Vitals:   11/28/23 0445 11/28/23 0807  BP: (!) 157/114   Pulse: 94   Resp:    Temp:    SpO2:  96%   Vitals:   11/27/23 2100 11/28/23 0444 11/28/23 0445 11/28/23 0807  BP: (!) 132/91 (!) 162/104 (!) 157/114   Pulse: 87 95  94   Resp: (!) 22 16    Temp: 98.4 F (36.9 C) 99.1 F (37.3 C)    TempSrc: Oral Oral    SpO2: 95% 93%  96%  Weight:      Height:       General: Pt is alert, awake, not in acute distress Cardiovascular: normal S1/S2 +, no rubs, no gallops Respiratory: CTA bilaterally, no wheezing, no rhonchi Abdominal: Soft, NT, ND, bowel sounds + Extremities: no edema, no cyanosis   The results of significant diagnostics from this hospitalization (including imaging, microbiology, ancillary and laboratory) are listed below for reference.     Microbiology: Recent Results (from the past 240 hours)  Resp panel by RT-PCR (RSV, Flu A&B, Covid) Anterior Nasal Swab     Status: Abnormal   Collection Time: 11/27/23 11:34 AM   Specimen: Anterior Nasal Swab  Result Value Ref Range Status   SARS Coronavirus 2 by RT PCR NEGATIVE NEGATIVE Final    Comment: (NOTE) SARS-CoV-2 target nucleic acids are NOT DETECTED.  The SARS-CoV-2 RNA is  generally detectable in upper respiratory specimens during the acute phase of infection. The lowest concentration of SARS-CoV-2 viral copies this assay can detect is 138 copies/mL. A negative result does not preclude SARS-Cov-2 infection and should not be used as the sole basis for treatment or other patient management decisions. A negative result may occur with  improper specimen collection/handling, submission of specimen other than nasopharyngeal swab, presence of viral mutation(s) within the areas targeted by this assay, and inadequate number of viral copies(<138 copies/mL). A negative result must be combined with clinical observations, patient history, and epidemiological information. The expected result is Negative.  Fact Sheet for Patients:  BloggerCourse.com  Fact Sheet for Healthcare Providers:  SeriousBroker.it  This test is no t yet approved or cleared by the [place] FDA and  has been authorized for detection and/or diagnosis of SARS-CoV-2 by FDA under an Emergency Use Authorization (EUA). This EUA will remain  in effect (meaning this test can be used) for the duration of the COVID-19 declaration under Section 564(b)(1) of the Act, 21 U.S.C.section 360bbb-3(b)(1), unless the authorization is terminated  or revoked sooner.       Influenza A by PCR NEGATIVE NEGATIVE Final   Influenza B by PCR NEGATIVE NEGATIVE Final    Comment: (NOTE) The Xpert Xpress SARS-CoV-2/FLU/RSV plus assay is intended as an aid in the diagnosis of influenza from Nasopharyngeal swab specimens and should not be used as a sole basis for treatment. Nasal washings and aspirates are unacceptable for Xpert Xpress SARS-CoV-2/FLU/RSV testing.  Fact Sheet for Patients: BloggerCourse.com  Fact Sheet for Healthcare Providers: SeriousBroker.it  This test is not yet approved or cleared by the [place] FDA and has been authorized for detection and/or diagnosis of SARS-CoV-2 by FDA under an Emergency Use Authorization (EUA). This EUA will remain in effect (meaning this test can be used) for the duration of the COVID-19 declaration under Section 564(b)(1) of the Act, 21 U.S.C. section 360bbb-3(b)(1), unless the authorization is terminated or revoked.     Resp Syncytial Virus by PCR POSITIVE (A) NEGATIVE Final    Comment: (NOTE) Fact Sheet for Patients: BloggerCourse.com  Fact Sheet for Healthcare Providers: SeriousBroker.it  This test is not yet approved or cleared by the [place] FDA and has been authorized for detection and/or diagnosis of SARS-CoV-2 by FDA under an Emergency Use Authorization (EUA). This EUA will remain in effect (meaning this test can be used) for the  duration of the COVID-19 declaration under Section 564(b)(1) of the Act, 21 U.S.C. section 360bbb-3(b)(1), unless the authorization is terminated or revoked.  Performed at [facility], [address] [place], [place] [address]      Labs: BNP (last 3 results) Recent Labs    08/30/23 0311 08/31/23 0748 11/27/23 1143  BNP 792.4* 2,258.1* 1,126.0*   Basic Metabolic Panel: Recent Labs  Lab 11/27/23 1143 11/28/23 0541  NA 137 135  K 3.6 3.2*  CL 101 100  CO2 23 22  GLUCOSE 116* 153*  BUN 32* 34*  CREATININE 1.66* 1.52*  CALCIUM 9.3 9.0   Liver Function Tests: No results for input(s): "AST", "ALT", "ALKPHOS", "BILITOT", "PROT", "ALBUMIN" in the last 168 hours. No results for input(s): "LIPASE", "AMYLASE" in the last 168 hours. No results for input(s): "AMMONIA" in the last 168 hours. CBC: Recent Labs  Lab 11/27/23 1143 11/28/23 0541  WBC 6.1 4.9  NEUTROABS 4.1  --   HGB 15.4 13.5  HCT 46.6 41.3  MCV 91.4 91.4  PLT 218 166   Cardiac Enzymes: No results for input(s): "CKTOTAL", "CKMB", "CKMBINDEX", "TROPONINI" in the last 168  hours. BNP: Invalid input(s): "POCBNP" CBG: No results for input(s): "GLUCAP" in the last 168 hours. D-Dimer No results for input(s): "DDIMER" in the last 72 hours. Hgb A1c No results for input(s): "HGBA1C" in the last 72 hours. Lipid Profile No results for input(s): "CHOL", "HDL", "LDLCALC", "TRIG", "CHOLHDL", "LDLDIRECT" in the last 72 hours. Thyroid function studies No results for input(s): "TSH", "T4TOTAL", "T3FREE", "THYROIDAB" in the last 72 hours.  Invalid input(s): "FREET3" Anemia work up No results for input(s): "VITAMINB12", "FOLATE", "FERRITIN", "TIBC", "IRON", "RETICCTPCT" in the last 72 hours. Urinalysis    Component Value Date/Time   COLORURINE YELLOW 08/30/2022 0625   APPEARANCEUR CLEAR 08/30/2022 0625   LABSPEC 1.016 08/30/2022 0625   PHURINE 5.0 08/30/2022 0625   GLUCOSEU NEGATIVE 08/30/2022 0625   HGBUR SMALL (A) 08/30/2022 0625   BILIRUBINUR NEGATIVE 08/30/2022 0625   KETONESUR NEGATIVE 08/30/2022 0625   PROTEINUR 100 (A) 08/30/2022 0625   NITRITE NEGATIVE 08/30/2022 0625   LEUKOCYTESUR NEGATIVE 08/30/2022 0625   Sepsis Labs Recent Labs  Lab 11/27/23 1143 11/28/23 0541  WBC 6.1 4.9   Microbiology Recent Results (from the past 240 hours)  Resp panel by RT-PCR (RSV, Flu A&B, Covid) Anterior Nasal Swab     Status: Abnormal   Collection Time: 11/27/23 11:34 AM   Specimen: Anterior Nasal Swab  Result Value Ref Range Status   SARS Coronavirus 2 by RT PCR NEGATIVE NEGATIVE Final    Comment: (NOTE) SARS-CoV-2 target nucleic acids are NOT DETECTED.  The SARS-CoV-2 RNA is generally detectable in upper respiratory specimens during the acute phase of infection. The lowest concentration of SARS-CoV-2 viral copies this assay can detect is 138 copies/mL. A negative result does not preclude SARS-Cov-2 infection and should not be used as the sole basis for treatment or other patient management decisions. A negative result may occur with  improper specimen  collection/handling, submission of specimen other than nasopharyngeal swab, presence of viral mutation(s) within the areas targeted by this assay, and inadequate number of viral copies(<138 copies/mL). A negative result must be combined with clinical observations, patient history, and epidemiological information. The expected result is Negative.  Fact Sheet for Patients:  BloggerCourse.com  Fact Sheet for Healthcare Providers:  SeriousBroker.it  This test is no t yet approved or cleared by the [place] FDA and  has been authorized for detection  and/or diagnosis of SARS-CoV-2 by FDA under an Emergency Use Authorization (EUA). This EUA will remain  in effect (meaning this test can be used) for the duration of the COVID-19 declaration under Section 564(b)(1) of the Act, 21 U.S.C.section 360bbb-3(b)(1), unless the authorization is terminated  or revoked sooner.       Influenza A by PCR NEGATIVE NEGATIVE Final   Influenza B by PCR NEGATIVE NEGATIVE Final    Comment: (NOTE) The Xpert Xpress SARS-CoV-2/FLU/RSV plus assay is intended as an aid in the diagnosis of influenza from Nasopharyngeal swab specimens and should not be used as a sole basis for treatment. Nasal washings and aspirates are unacceptable for Xpert Xpress SARS-CoV-2/FLU/RSV testing.  Fact Sheet for Patients: BloggerCourse.com  Fact Sheet for Healthcare Providers: SeriousBroker.it  This test is not yet approved or cleared by the [place] FDA and has been authorized for detection and/or diagnosis of SARS-CoV-2 by FDA under an Emergency Use Authorization (EUA). This EUA will remain in effect (meaning this test can be used) for the duration of the COVID-19 declaration under Section 564(b)(1) of the Act, 21 U.S.C. section 360bbb-3(b)(1), unless the authorization is terminated or revoked.     Resp Syncytial  Virus by PCR POSITIVE (A) NEGATIVE Final    Comment: (NOTE) Fact Sheet for Patients: BloggerCourse.com  Fact Sheet for Healthcare Providers: SeriousBroker.it  This test is not yet approved or cleared by the [place] FDA and has been authorized for detection and/or diagnosis of SARS-CoV-2 by FDA under an Emergency Use Authorization (EUA). This EUA will remain in effect (meaning this test can be used) for the duration of the COVID-19 declaration under Section 564(b)(1) of the Act, 21 U.S.C. section 360bbb-3(b)(1), unless the authorization is terminated or revoked.  Performed at [facility], [address], [place], [place] [address]     Time coordinating discharge:   SIGNED:  Standley Dakins, MD  [facility] 11/28/2023, 10:31 AM How to contact the [facility] Attending or Consulting provider 7A - 7P or covering provider during after hours 7P -7A, for this patient?  Check the care team in [facility] and look for a) attending/consulting [facility] provider listed and b) the [facility] team listed Log into [email or web address] and use [facility]'s universal password to access. If you do not have the password, please contact the hospital operator. Locate the [facility] provider you are looking for under [facility] and page to a number that you can be directly reached. If you still have difficulty reaching the provider, please page the [facility] (Director on Call) for the Hospitalists listed on amion for assistance.

## 2023-11-28 NOTE — TOC Initial Note (Signed)
Transition of Care [facility]) - Initial/Assessment Note    Patient Details  Name: Richard Cox MRN: [id number] Date of Birth: 04-Jul-1981  [facility]) CM/SW Contact:    Villa Herb, LCSWA Phone Number: 11/28/2023, 11:20 AM  Clinical Narrative:                 Pt is high risk for readmission. Pt from home. Pt is independent in completing ADLs. Currently there are no TOC needs. TOC to follow.   Expected Discharge Plan: Home/Self Care Barriers to Discharge: No Barriers Identified   Patient Goals and CMS Choice Patient states their goals for this hospitalization and ongoing recovery are:: return home CMS Medicare.gov Compare Post Acute Care list provided to:: Patient Choice offered to / list presented to : Patient      Expected Discharge Plan and Services In-house Referral: Clinical Social Work Discharge Planning Services: CM Consult   Living arrangements for the past 2 months: Single Family Home Expected Discharge Date: 11/28/23                                    Prior Living Arrangements/Services Living arrangements for the past 2 months: Single Family Home Lives with:: Self Patient language and need for interpreter reviewed:: Yes Do you feel safe going back to the place where you live?: Yes      Need for Family Participation in Patient Care: Yes (Comment) Care giver support system in place?: Yes (comment)   Criminal Activity/Legal Involvement Pertinent to Current Situation/Hospitalization: No - Comment as needed  Activities of Daily Living   ADL Screening (condition at time of admission) Independently performs ADLs?: Yes (appropriate for developmental age) Is the patient deaf or have difficulty hearing?: No Does the patient have difficulty seeing, even when wearing glasses/contacts?: No Does the patient have difficulty concentrating, remembering, or making decisions?: No  Permission Sought/Granted                  Emotional  Assessment Appearance:: Appears stated age Attitude/Demeanor/Rapport: Engaged Affect (typically observed): Accepting Orientation: : Oriented to Self, Oriented to Place, Oriented to  Time, Oriented to Situation Alcohol / Substance Use: Not Applicable Psych Involvement: No (comment)  Admission diagnosis:  RSV (acute bronchiolitis due to respiratory syncytial virus) [J21.0] Hypoxia [R09.02] Acute respiratory failure with hypoxia (HCC) [J96.01] RSV infection [B33.8] Bronchiolitis due to respiratory syncytial virus (RSV) [J21.0] Patient Active Problem List   Diagnosis Date Noted   Bronchiolitis due to respiratory syncytial virus (RSV) 11/28/2023   RSV infection 11/27/2023   Otitis media 11/01/2023   Left hip pain 09/01/2023   Anxiety 09/01/2023   On mechanically assisted ventilation (HCC) 08/29/2023   Acute pulmonary edema (HCC) 08/29/2023   Respiratory failure (HCC) 08/28/2023   Hypoxia 05/21/2023   Acute respiratory failure (HCC) 05/21/2023   Poor social situation 05/19/2023   CHF (congestive heart failure) (HCC) 09/04/2022   Cocaine abuse (HCC) 09/04/2022   Hypertensive crisis 08/13/2022   Healthcare maintenance 10/17/2021   Acute respiratory failure with hypoxia (HCC) 08/24/2021   Elevated troponin 08/24/2021   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   Intra-abdominal lymphadenopathy 07/30/2020   Generalized lymphadenopathy 07/25/2020   Varicella zoster 01/04/2020   AIDS (acquired immune deficiency syndrome) (HCC) 10/10/2019   Acute on chronic combined systolic and diastolic CHF (congestive heart failure) (HCC) 09/18/2019   HIV (human immunodeficiency virus infection) (HCC) 09/18/2019   Substance abuse (HCC)  09/18/2019   Alcohol abuse 09/18/2019   Tobacco abuse 09/18/2019   HTN (hypertension) 03/10/2019   Noncompliance with medication regimen 03/10/2019   PCP:  Rica Records, FNP Pharmacy:   [facility] [address] [place] - [address] [place] [address] Phone: [phone] Fax: [phone]  [id number] [place], [place] - [address] AT [facility] OF [address] [place] [address] Phone: [phone] Fax: [phone]  [facility] [address] [place] [address] Phone: [phone] Fax: [phone]  [facility] [phone] @ [address] [place], [place] - [address] [place] [address] Phone: [phone] Fax: [phone]  [facility] #[id number] - [place], [place] - [address] AT [facility] OF [address] [place] [address] Phone: [phone] Fax: [phone]     Social Drivers of Health (SDOH) Social History: SDOH Screenings   Food Insecurity: Food Insecurity Present (11/27/2023)  Housing: Low Risk  (11/27/2023)  Transportation Needs: Unmet Transportation Needs (11/27/2023)  Utilities: Not At Risk (11/27/2023)  Alcohol Screen: Low Risk  (10/14/2020)  Depression (PHQ2-9): Medium Risk (11/01/2023)  Financial Resource Strain: Low Risk  (09/10/2020)  Physical Activity: Insufficiently Active (09/10/2020)  Social Connections: Socially Isolated (09/10/2020)  Stress: No Stress Concern Present (09/10/2020)  Tobacco Use: Medium Risk (11/01/2023)   SDOH Interventions:     Readmission Risk Interventions    11/28/2023   11:19 AM 08/31/2022    8:43 AM 08/27/2021    3:31 PM  Readmission Risk Prevention Plan  Transportation Screening Complete Complete Complete  HRI or Home Care Consult Complete    Social Work Consult for Recovery Care Planning/Counseling Complete    Palliative Care Screening Not Applicable    Medication Review Oceanographer) Complete Complete   PCP or Specialist appointment within 3-5 days of discharge   Not Complete  PCP/Specialist Appt Not Complete comments   Left message with Dr Allena Katz,  waiting for call back.  HRI or Home Care Consult  Complete Complete  SW Recovery Care/Counseling Consult  Complete Complete  Palliative Care Screening  Not Applicable Not Applicable  Skilled Nursing Facility  Not Applicable Not Applicable

## 2023-11-28 NOTE — Plan of Care (Signed)
  Problem: Education: Goal: Knowledge of General Education information will improve Description Including pain rating scale, medication(s)/side effects and non-pharmacologic comfort measures Outcome: Progressing   Problem: Health Behavior/Discharge Planning: Goal: Ability to manage health-related needs will improve Outcome: Progressing   

## 2023-11-28 NOTE — Progress Notes (Signed)
Patient given discharge instructions and follow up appointments , removed IV from LFA. Patient request to shred discharge summary , did as patient requested. Patients father here to get him , patient transported by w/c to car

## 2023-11-28 NOTE — Discharge Instructions (Signed)
  PLEASE FOLLOW UP IN [facility] WITH DR Drue Second ON 05/12/23 AS SCHEDULED.   PLEASE FOLLOW UP WITH PRIMARY CARE PROVIDER IN 1 WEEK    IMPORTANT INFORMATION: PAY CLOSE ATTENTION   PHYSICIAN DISCHARGE INSTRUCTIONS  Follow with Primary care provider  Del Newman Nip, Tenna Child, FNP  and other consultants as instructed by your Hospitalist Physician  SEEK MEDICAL CARE OR RETURN TO EMERGENCY ROOM IF SYMPTOMS COME BACK, WORSEN OR NEW PROBLEM DEVELOPS   Please note: You were cared for by a hospitalist during your hospital stay. Every effort will be made to forward records to your primary care provider.  You can request that your primary care provider send for your hospital records if they have not received them.  Once you are discharged, your primary care physician will handle any further medical issues. Please note that NO REFILLS for any discharge medications will be authorized once you are discharged, as it is imperative that you return to your primary care physician (or establish a relationship with a primary care physician if you do not have one) for your post hospital discharge needs so that they can reassess your need for medications and monitor your lab values.  Please get a complete blood count and chemistry panel checked by your Primary MD at your next visit, and again as instructed by your Primary MD.  Get Medicines reviewed and adjusted: Please take all your medications with you for your next visit with your Primary MD  Laboratory/radiological data: Please request your Primary MD to go over all hospital tests and procedure/radiological results at the follow up, please ask your primary care provider to get all Hospital records sent to his/her office.  In some cases, they will be blood work, cultures and biopsy results pending at the time of your discharge. Please request that your primary care provider follow up on these results.  If you are diabetic, please bring your blood sugar readings  with you to your follow up appointment with primary care.    Please call and make your follow up appointments as soon as possible.    Also Note the following: If you experience worsening of your admission symptoms, develop shortness of breath, life threatening emergency, suicidal or homicidal thoughts you must seek medical attention immediately by calling 911 or calling your MD immediately  if symptoms less severe.  You must read complete instructions/literature along with all the possible adverse reactions/side effects for all the Medicines you take and that have been prescribed to you. Take any new Medicines after you have completely understood and accpet all the possible adverse reactions/side effects.   Do not drive when taking Pain medications or sleeping medications (Benzodiazepines)  Do not take more than prescribed Pain, Sleep and Anxiety Medications. It is not advisable to combine anxiety,sleep and pain medications without talking with your primary care practitioner  Special Instructions: If you have smoked or chewed Tobacco  in the last 2 yrs please stop smoking, stop any regular Alcohol  and or any Recreational drug use.  Wear Seat belts while driving.  Do not drive if taking any narcotic, mind altering or controlled substances or recreational drugs or alcohol.

## 2023-12-02 ENCOUNTER — Telehealth: Payer: Self-pay

## 2023-12-02 DIAGNOSIS — B20 Human immunodeficiency virus [HIV] disease: Secondary | ICD-10-CM

## 2023-12-02 MED ORDER — BIKTARVY 50-200-25 MG PO TABS: 1.0000 | ORAL_TABLET | Freq: Every day | ORAL | 0 refills | Status: DC

## 2023-12-02 NOTE — Addendum Note (Signed)
Addended by: Linna Hoff D on: 12/02/2023 01:53 PM   Modules accepted: Orders

## 2023-12-02 NOTE — Telephone Encounter (Signed)
Patient called to scheduled missed appointment and reschedule for 2/19 but will need at least a 30 supply of Biktarvy sent to the [facility] on [address].

## 2023-12-03 ENCOUNTER — Ambulatory Visit: Payer: Medicare Other | Admitting: Nurse Practitioner

## 2023-12-14 ENCOUNTER — Other Ambulatory Visit: Payer: Self-pay | Admitting: Family Medicine

## 2023-12-14 DIAGNOSIS — I509 Heart failure, unspecified: Secondary | ICD-10-CM

## 2023-12-14 MED ORDER — ALBUTEROL SULFATE HFA 108 (90 BASE) MCG/ACT IN AERS: 2.0000 | INHALATION_SPRAY | Freq: Four times a day (QID) | RESPIRATORY_TRACT | 2 refills | Status: DC | PRN

## 2023-12-14 NOTE — Telephone Encounter (Signed)
 Copied from CRM [id number]. Topic: Clinical - Medication Refill >> Dec 14, 2023  9:35 AM Suzette B wrote: Most Recent Primary Care Visit:  Provider: TERRY WILHELMENA LLOYD HILARIO  Department: [facility]  Visit Type: ACUTE  Date: 11/01/2023  Medication:  furosemide  (LASIX ) 80 MG tablet albuterol  (VENTOLIN  HFA) 108 (90 Base) MCG/ACT inhaler    Has the patient contacted their pharmacy? Yes (Agent: If no, request that the patient contact the pharmacy for the refill. If patient does not wish to contact the pharmacy document the reason why and proceed with request.) (Agent: If yes, when and what did the pharmacy advise?) yes was told he was out of refills  Is this the correct pharmacy for this prescription? Yes If no, delete pharmacy and type the correct one.  This is the patient's preferred pharmacy:  [facility] [address] [place] - [address] [place] [address] Phone: [phone] Fax: [phone]  [id number] [place], [place] - [address] AT [facility] OF [address] [place] [address] Phone: [phone] Fax: [phone]  [facility] [address] [place] [address] Phone: [phone] Fax: [phone]  [facility] [phone] @ [address] [place], [place] - [address] [place] [address] Phone: [phone] Fax: [phone]  [id number] - [place], [place] - [address] AT [facility] OF [address] [place] [address] Phone: [phone] Fax: [phone]   Has the prescription been filled recently? Yes  Is the patient out of the medication? Yes  Has the patient been seen for an appointment in the last year OR does the patient have an upcoming appointment? Yes  Can we respond through MyChart? Yes  Agent: Please be advised that Rx refills  may take up to 3 business days. We ask that you follow-up with your pharmacy.

## 2023-12-15 ENCOUNTER — Emergency Department (HOSPITAL_COMMUNITY): Payer: Medicare Other

## 2023-12-15 ENCOUNTER — Inpatient Hospital Stay (HOSPITAL_COMMUNITY)
Admission: EM | Admit: 2023-12-15 | Discharge: 2023-12-15 | DRG: 917 | Discharge status: Against Medical Advice | Payer: Medicare Other | Attending: Internal Medicine | Admitting: Internal Medicine

## 2023-12-15 ENCOUNTER — Other Ambulatory Visit: Payer: Self-pay

## 2023-12-15 ENCOUNTER — Encounter (HOSPITAL_COMMUNITY): Payer: Self-pay | Admitting: *Deleted

## 2023-12-15 ENCOUNTER — Inpatient Hospital Stay (HOSPITAL_COMMUNITY): Payer: Medicare Other

## 2023-12-15 DIAGNOSIS — R059 Cough, unspecified: Secondary | ICD-10-CM | POA: Diagnosis present

## 2023-12-15 DIAGNOSIS — Z91128 Patient's intentional underdosing of medication regimen for other reason: Secondary | ICD-10-CM

## 2023-12-15 DIAGNOSIS — Z833 Family history of diabetes mellitus: Secondary | ICD-10-CM | POA: Diagnosis not present

## 2023-12-15 DIAGNOSIS — T405X1A Poisoning by cocaine, accidental (unintentional), initial encounter: Principal | ICD-10-CM | POA: Diagnosis present

## 2023-12-15 DIAGNOSIS — I509 Heart failure, unspecified: Principal | ICD-10-CM

## 2023-12-15 DIAGNOSIS — N182 Chronic kidney disease, stage 2 (mild): Secondary | ICD-10-CM | POA: Diagnosis present

## 2023-12-15 DIAGNOSIS — F1910 Other psychoactive substance abuse, uncomplicated: Secondary | ICD-10-CM | POA: Diagnosis present

## 2023-12-15 DIAGNOSIS — J9602 Acute respiratory failure with hypercapnia: Secondary | ICD-10-CM | POA: Diagnosis present

## 2023-12-15 DIAGNOSIS — Z79899 Other long term (current) drug therapy: Secondary | ICD-10-CM | POA: Diagnosis not present

## 2023-12-15 DIAGNOSIS — Z883 Allergy status to other anti-infective agents status: Secondary | ICD-10-CM

## 2023-12-15 DIAGNOSIS — Z87891 Personal history of nicotine dependence: Secondary | ICD-10-CM | POA: Diagnosis not present

## 2023-12-15 DIAGNOSIS — Z807 Family history of other malignant neoplasms of lymphoid, hematopoietic and related tissues: Secondary | ICD-10-CM

## 2023-12-15 DIAGNOSIS — I422 Other hypertrophic cardiomyopathy: Secondary | ICD-10-CM | POA: Diagnosis present

## 2023-12-15 DIAGNOSIS — I130 Hypertensive heart and chronic kidney disease with heart failure and stage 1 through stage 4 chronic kidney disease, or unspecified chronic kidney disease: Secondary | ICD-10-CM | POA: Diagnosis present

## 2023-12-15 DIAGNOSIS — I5043 Acute on chronic combined systolic (congestive) and diastolic (congestive) heart failure: Secondary | ICD-10-CM | POA: Diagnosis present

## 2023-12-15 DIAGNOSIS — Z8269 Family history of other diseases of the musculoskeletal system and connective tissue: Secondary | ICD-10-CM

## 2023-12-15 DIAGNOSIS — I169 Hypertensive crisis, unspecified: Secondary | ICD-10-CM | POA: Diagnosis present

## 2023-12-15 DIAGNOSIS — F1410 Cocaine abuse, uncomplicated: Secondary | ICD-10-CM | POA: Diagnosis present

## 2023-12-15 DIAGNOSIS — R7989 Other specified abnormal findings of blood chemistry: Secondary | ICD-10-CM | POA: Diagnosis present

## 2023-12-15 DIAGNOSIS — T50916A Underdosing of multiple unspecified drugs, medicaments and biological substances, initial encounter: Secondary | ICD-10-CM | POA: Diagnosis present

## 2023-12-15 DIAGNOSIS — C851A Unspecified b-cell lymphoma, in remission: Secondary | ICD-10-CM | POA: Diagnosis present

## 2023-12-15 DIAGNOSIS — C8510 Unspecified B-cell lymphoma, unspecified site: Secondary | ICD-10-CM | POA: Diagnosis present

## 2023-12-15 DIAGNOSIS — J9601 Acute respiratory failure with hypoxia: Secondary | ICD-10-CM | POA: Diagnosis present

## 2023-12-15 DIAGNOSIS — Z886 Allergy status to analgesic agent status: Secondary | ICD-10-CM

## 2023-12-15 DIAGNOSIS — Z1152 Encounter for screening for COVID-19: Secondary | ICD-10-CM | POA: Diagnosis not present

## 2023-12-15 DIAGNOSIS — B20 Human immunodeficiency virus [HIV] disease: Secondary | ICD-10-CM | POA: Diagnosis present

## 2023-12-15 DIAGNOSIS — F419 Anxiety disorder, unspecified: Secondary | ICD-10-CM | POA: Diagnosis present

## 2023-12-15 DIAGNOSIS — Z8249 Family history of ischemic heart disease and other diseases of the circulatory system: Secondary | ICD-10-CM

## 2023-12-15 DIAGNOSIS — Z91199 Patient's noncompliance with other medical treatment and regimen due to unspecified reason: Secondary | ICD-10-CM

## 2023-12-15 LAB — RAPID URINE DRUG SCREEN, HOSP PERFORMED
Amphetamines: NOT DETECTED
Barbiturates: NOT DETECTED
Benzodiazepines: NOT DETECTED
Cocaine: POSITIVE — AB
Opiates: NOT DETECTED
Tetrahydrocannabinol: POSITIVE — AB

## 2023-12-15 LAB — BRAIN NATRIURETIC PEPTIDE: B Natriuretic Peptide: >4500 pg/mL — ABNORMAL HIGH (ref 0.0–100.0)

## 2023-12-15 LAB — CBC WITH DIFFERENTIAL/PLATELET
Abs Immature Granulocytes: 0.02 10*3/uL (ref 0.00–0.07)
Basophils Absolute: 0 10*3/uL (ref 0.0–0.1)
Basophils Relative: 0 %
Eosinophils Absolute: 0.1 10*3/uL (ref 0.0–0.5)
Eosinophils Relative: 1 %
HCT: 41.2 % (ref 39.0–52.0)
Hemoglobin: 12.9 g/dL — ABNORMAL LOW (ref 13.0–17.0)
Immature Granulocytes: 0 %
Lymphocytes Relative: 38 %
Lymphs Abs: 3.7 10*3/uL (ref 0.7–4.0)
MCH: 29.5 pg (ref 26.0–34.0)
MCHC: 31.3 g/dL (ref 30.0–36.0)
MCV: 94.3 fL (ref 80.0–100.0)
Monocytes Absolute: 0.4 10*3/uL (ref 0.1–1.0)
Monocytes Relative: 4 %
Neutro Abs: 5.6 10*3/uL (ref 1.7–7.7)
Neutrophils Relative %: 57 %
Platelets: 267 10*3/uL (ref 150–400)
RBC: 4.37 MIL/uL (ref 4.22–5.81)
RDW: 15 % (ref 11.5–15.5)
WBC: 9.8 10*3/uL (ref 4.0–10.5)
nRBC: 0 % (ref 0.0–0.2)

## 2023-12-15 LAB — URINALYSIS, ROUTINE W REFLEX MICROSCOPIC
Bacteria, UA: NONE SEEN
Bilirubin Urine: NEGATIVE
Glucose, UA: NEGATIVE mg/dL
Hgb urine dipstick: NEGATIVE
Ketones, ur: NEGATIVE mg/dL
Leukocytes,Ua: NEGATIVE
Nitrite: NEGATIVE
Protein, ur: 100 mg/dL — AB
Specific Gravity, Urine: 1.017 (ref 1.005–1.030)
pH: 5 (ref 5.0–8.0)

## 2023-12-15 LAB — COMPREHENSIVE METABOLIC PANEL
ALT: 17 U/L (ref 0–44)
AST: 25 U/L (ref 15–41)
Albumin: 3.7 g/dL (ref 3.5–5.0)
Alkaline Phosphatase: 79 U/L (ref 38–126)
Anion gap: 12 (ref 5–15)
BUN: 28 mg/dL — ABNORMAL HIGH (ref 6–20)
CO2: 24 mmol/L (ref 22–32)
Calcium: 9.5 mg/dL (ref 8.9–10.3)
Chloride: 103 mmol/L (ref 98–111)
Creatinine, Ser: 1.39 mg/dL — ABNORMAL HIGH (ref 0.61–1.24)
GFR, Estimated: >60 mL/min (ref >60)
Glucose, Bld: 126 mg/dL — ABNORMAL HIGH (ref 70–99)
Potassium: 4.1 mmol/L (ref 3.5–5.1)
Sodium: 139 mmol/L (ref 135–145)
Total Bilirubin: 0.7 mg/dL (ref 0.0–1.2)
Total Protein: 8.1 g/dL (ref 6.5–8.1)

## 2023-12-15 LAB — BLOOD GAS, VENOUS
Acid-base deficit: 0.7 mmol/L (ref 0.0–2.0)
Bicarbonate: 26.6 mmol/L (ref 20.0–28.0)
Drawn by: 43427
O2 Saturation: 73.7 %
Patient temperature: 36.6
pCO2, Ven: 53 mm[Hg] (ref 44–60)
pH, Ven: 7.31 (ref 7.25–7.43)
pO2, Ven: 47 mm[Hg] — ABNORMAL HIGH (ref 32–45)

## 2023-12-15 LAB — I-STAT CHEM 8, ED
BUN: 29 mg/dL — ABNORMAL HIGH (ref 6–20)
Calcium, Ion: 1.13 mmol/L — ABNORMAL LOW (ref 1.15–1.40)
Chloride: 106 mmol/L (ref 98–111)
Creatinine, Ser: 1.4 mg/dL — ABNORMAL HIGH (ref 0.61–1.24)
Glucose, Bld: 140 mg/dL — ABNORMAL HIGH (ref 70–99)
HCT: 40 % (ref 39.0–52.0)
Hemoglobin: 13.6 g/dL (ref 13.0–17.0)
Potassium: 4 mmol/L (ref 3.5–5.1)
Sodium: 140 mmol/L (ref 135–145)
TCO2: 27 mmol/L (ref 22–32)

## 2023-12-15 LAB — RESP PANEL BY RT-PCR (RSV, FLU A&B, COVID)  RVPGX2
Influenza A by PCR: NEGATIVE
Influenza B by PCR: NEGATIVE
Resp Syncytial Virus by PCR: NEGATIVE
SARS Coronavirus 2 by RT PCR: NEGATIVE

## 2023-12-15 LAB — MAGNESIUM: Magnesium: 2.4 mg/dL (ref 1.7–2.4)

## 2023-12-15 LAB — TROPONIN I (HIGH SENSITIVITY)
Troponin I (High Sensitivity): 184 ng/L (ref <18)
Troponin I (High Sensitivity): 141 ng/L (ref <18)

## 2023-12-15 MED ORDER — ISOSORBIDE DINITRATE 10 MG PO TABS
10.0000 mg | ORAL_TABLET | Freq: Three times a day (TID) | ORAL | Status: DC
  Administered 2023-12-15: 10 mg via ORAL
  Filled 2023-12-15 (×7): qty 1

## 2023-12-15 MED ORDER — METHYLPREDNISOLONE SODIUM SUCC 125 MG IJ SOLR
125.0000 mg | Freq: Once | INTRAMUSCULAR | Status: AC
  Administered 2023-12-15: 125 mg via INTRAVENOUS
  Filled 2023-12-15: qty 2

## 2023-12-15 MED ORDER — FUROSEMIDE 40 MG PO TABS: 80.0000 mg | ORAL_TABLET | Freq: Every day | ORAL | Status: DC

## 2023-12-15 MED ORDER — ALBUTEROL SULFATE HFA 108 (90 BASE) MCG/ACT IN AERS: 2.0000 | INHALATION_SPRAY | Freq: Four times a day (QID) | RESPIRATORY_TRACT | Status: DC | PRN

## 2023-12-15 MED ORDER — ALBUTEROL SULFATE (2.5 MG/3ML) 0.083% IN NEBU
10.0000 mg/h | INHALATION_SOLUTION | Freq: Once | RESPIRATORY_TRACT | Status: AC
  Administered 2023-12-15: 10 mg/h via RESPIRATORY_TRACT
  Filled 2023-12-15: qty 12

## 2023-12-15 MED ORDER — ACETAMINOPHEN 325 MG PO TABS: 650.0000 mg | ORAL_TABLET | Freq: Four times a day (QID) | ORAL | Status: DC | PRN

## 2023-12-15 MED ORDER — BICTEGRAVIR-EMTRICITAB-TENOFOV 50-200-25 MG PO TABS
1.0000 | ORAL_TABLET | Freq: Every day | ORAL | Status: DC
Start: 2023-12-15 — End: 2023-12-15
  Administered 2023-12-15: 1 via ORAL
  Filled 2023-12-15 (×3): qty 1

## 2023-12-15 MED ORDER — SODIUM CHLORIDE 0.9% FLUSH
10.0000 mL | Freq: Two times a day (BID) | INTRAVENOUS | Status: DC
  Administered 2023-12-15: 10 mL

## 2023-12-15 MED ORDER — CYCLOBENZAPRINE HCL 10 MG PO TABS
5.0000 mg | ORAL_TABLET | Freq: Three times a day (TID) | ORAL | Status: DC | PRN
Start: 2023-12-15 — End: 2023-12-15

## 2023-12-15 MED ORDER — HYDRALAZINE HCL 20 MG/ML IJ SOLN: 10.0000 mg | INTRAMUSCULAR | Status: DC | PRN

## 2023-12-15 MED ORDER — HYDRALAZINE HCL 25 MG PO TABS
50.0000 mg | ORAL_TABLET | Freq: Three times a day (TID) | ORAL | Status: DC
  Administered 2023-12-15: 50 mg via ORAL
  Filled 2023-12-15: qty 2

## 2023-12-15 MED ORDER — HEPARIN SOD (PORK) LOCK FLUSH 100 UNIT/ML IV SOLN
500.0000 [IU] | Freq: Once | INTRAVENOUS | Status: AC
  Administered 2023-12-15: 500 [IU]
  Filled 2023-12-15: qty 5

## 2023-12-15 MED ORDER — ALBUTEROL SULFATE (2.5 MG/3ML) 0.083% IN NEBU: 2.5000 mg | INHALATION_SOLUTION | Freq: Four times a day (QID) | RESPIRATORY_TRACT | Status: DC | PRN

## 2023-12-15 MED ORDER — FUROSEMIDE 10 MG/ML IJ SOLN: 40.0000 mg | Freq: Once | INTRAMUSCULAR | Status: DC

## 2023-12-15 MED ORDER — SODIUM CHLORIDE 0.9% FLUSH: 10.0000 mL | INTRAVENOUS | Status: DC | PRN

## 2023-12-15 MED ORDER — FUROSEMIDE 10 MG/ML IJ SOLN
60.0000 mg | Freq: Two times a day (BID) | INTRAMUSCULAR | Status: DC
  Administered 2023-12-15: 60 mg via INTRAVENOUS
  Filled 2023-12-15: qty 6

## 2023-12-15 MED ORDER — ONDANSETRON HCL 4 MG PO TABS: 4.0000 mg | ORAL_TABLET | Freq: Four times a day (QID) | ORAL | Status: DC | PRN

## 2023-12-15 MED ORDER — IOHEXOL 350 MG/ML SOLN
75.0000 mL | Freq: Once | INTRAVENOUS | Status: AC | PRN
  Administered 2023-12-15: 75 mL via INTRAVENOUS

## 2023-12-15 MED ORDER — CARVEDILOL 12.5 MG PO TABS
25.0000 mg | ORAL_TABLET | Freq: Two times a day (BID) | ORAL | Status: DC
  Administered 2023-12-15: 25 mg via ORAL
  Filled 2023-12-15: qty 2

## 2023-12-15 MED ORDER — ACETAMINOPHEN 650 MG RE SUPP: 650.0000 mg | Freq: Four times a day (QID) | RECTAL | Status: DC | PRN

## 2023-12-15 MED ORDER — CHLORHEXIDINE GLUCONATE CLOTH 2 % EX PADS: 6.0000 | MEDICATED_PAD | Freq: Every day | CUTANEOUS | Status: DC

## 2023-12-15 MED ORDER — ONDANSETRON HCL 4 MG/2ML IJ SOLN: 4.0000 mg | Freq: Four times a day (QID) | INTRAMUSCULAR | Status: DC | PRN

## 2023-12-15 MED ORDER — LORAZEPAM 2 MG/ML IJ SOLN
1.0000 mg | Freq: Once | INTRAMUSCULAR | Status: AC
  Administered 2023-12-15: 1 mg via INTRAVENOUS
  Filled 2023-12-15: qty 1

## 2023-12-15 MED ORDER — DIAZEPAM 5 MG/ML IJ SOLN
5.0000 mg | Freq: Once | INTRAMUSCULAR | Status: AC
  Administered 2023-12-15: 5 mg via INTRAVENOUS
  Filled 2023-12-15: qty 2

## 2023-12-15 MED ORDER — FUROSEMIDE 10 MG/ML IJ SOLN
60.0000 mg | Freq: Once | INTRAMUSCULAR | Status: AC
  Administered 2023-12-15: 60 mg via INTRAVENOUS
  Filled 2023-12-15: qty 6

## 2023-12-15 MED ORDER — NITROGLYCERIN IN D5W 200-5 MCG/ML-% IV SOLN
5.0000 ug/min | INTRAVENOUS | Status: DC
  Administered 2023-12-15: 5 ug/min via INTRAVENOUS
  Filled 2023-12-15: qty 250

## 2023-12-15 MED ORDER — HEPARIN SODIUM (PORCINE) 5000 UNIT/ML IJ SOLN: 5000.0000 [IU] | Freq: Three times a day (TID) | INTRAMUSCULAR | Status: DC

## 2023-12-15 MED ORDER — FUROSEMIDE 80 MG PO TABS: 80.0000 mg | ORAL_TABLET | Freq: Every day | ORAL | 0 refills | Status: DC

## 2023-12-15 NOTE — ED Notes (Signed)
 Pt ambulated approx 50 feet with steady gait and sats at 95%  pt wants to go home, Dr Mason Sole aware

## 2023-12-15 NOTE — ED Notes (Signed)
 Pt wanting to speak with dr, Dr Mason Sole aware. Pt resting. Nad. Drink given.

## 2023-12-15 NOTE — ED Notes (Signed)
 Pt has had 02 out of nose x 30 min and sats maintaining 98%  pt just finished lunch, pt asking if he really needs to stay, Will notify DR Mason Sole

## 2023-12-15 NOTE — ED Notes (Signed)
 Pt asleep, pt awakes easily. On bipap, gave water. Pt oriented. States he feels he can do his ct scan now. Pt states he feels a lot better, breathing better and denies pain.

## 2023-12-15 NOTE — ED Provider Notes (Signed)
 [place] EMERGENCY DEPARTMENT AT [facility] Provider Note   CSN: [id number] Arrival date & time: 12/15/23  0202     History  Chief Complaint  Patient presents with   Shortness of Breath    Geroge Cox is a 43 y.o. male.  HPI Patient presents for shortness of breath.  Medical history includes HTN, CHF, HIV, polysubstance abuse, lymphoma, anxiety.  He was admitted 3 weeks ago for respiratory failure secondary to RSV.  He states that he ran out of his Lasix  yesterday.  He got short of breath today and did not have a inhaler at home to use.  He has had chest pain and a nonproductive cough.    Home Medications Prior to Admission medications   Medication Sig Start Date End Date Taking? Authorizing Provider  albuterol  (VENTOLIN  HFA) 108 (90 Base) MCG/ACT inhaler Inhale 2 puffs into the lungs every 6 (six) hours as needed for wheezing or shortness of breath. 12/14/23   Del Wilhelmena Lloyd Sola, FNP  bictegravir-emtricitabine -tenofovir  AF (BIKTARVY ) 50-200-25 MG TABS tablet Take 1 tablet by mouth daily. 12/02/23   Calone, Gregory D, FNP  carvedilol  (COREG ) 25 MG tablet Take 1 tablet (25 mg total) by mouth 2 (two) times daily with a meal. 08/31/23 11/29/23  [place], Camellia PARAS, DO  cyclobenzaprine  (FLEXERIL ) 5 MG tablet Take 1 tablet (5 mg total) by mouth 3 (three) times daily as needed for muscle spasms. 09/01/23   Del Orbe Polanco, Iliana, FNP  furosemide  (LASIX ) 80 MG tablet Take 1 tablet by mouth once daily 12/14/23   Del Orbe Polanco, Iliana, FNP  hydrALAZINE  (APRESOLINE ) 25 MG tablet Take 2 tablets (50 mg total) by mouth every 8 (eight) hours. 08/31/23 11/29/23  [place], Eric J, DO  isosorbide  dinitrate (ISORDIL ) 10 MG tablet Take 1 tablet (10 mg total) by mouth 3 (three) times daily. 08/31/23 11/29/23  [place], Eric J, DO      Allergies    Motrin  [ibuprofen ] and Neosporin [neomycin-bacitracin zn-polymyx]    Review of Systems   Review of Systems  Constitutional:  Positive for  fatigue.  Respiratory:  Positive for cough and shortness of breath.   Cardiovascular:  Positive for chest pain.  All other systems reviewed and are negative.   Physical Exam Updated Vital Signs BP (!) 140/89   Pulse (!) 105   Temp 97.9 F (36.6 C) (Temporal)   Resp (!) 21   Ht 6' 1 (1.854 m)   Wt 68.5 kg   SpO2 96%   BMI 19.92 kg/m  Physical Exam Vitals and nursing note reviewed.  Constitutional:      General: He is in acute distress.     Appearance: He is well-developed. He is ill-appearing. He is not toxic-appearing.  HENT:     Head: Normocephalic and atraumatic.  Eyes:     Conjunctiva/sclera: Conjunctivae normal.  Cardiovascular:     Rate and Rhythm: Regular rhythm. Tachycardia present.     Heart sounds: No murmur heard. Pulmonary:     Effort: Tachypnea, accessory muscle usage and respiratory distress present.     Breath sounds: Decreased air movement present. Decreased breath sounds and wheezing present.  Abdominal:     Palpations: Abdomen is soft.     Tenderness: There is no abdominal tenderness.  Musculoskeletal:        General: No swelling. Normal range of motion.     Cervical back: Normal range of motion and neck supple.  Skin:    General: Skin is warm and dry.  Coloration: Skin is not cyanotic or pale.  Neurological:     General: No focal deficit present.     Mental Status: He is alert and oriented to person, place, and time.  Psychiatric:        Mood and Affect: Mood normal.        Behavior: Behavior normal.     ED Results / Procedures / Treatments   Labs (all labs ordered are listed, but only abnormal results are displayed) Labs Reviewed  COMPREHENSIVE METABOLIC PANEL - Abnormal; Notable for the following components:      Result Value   Glucose, Bld 126 (*)    BUN 28 (*)    Creatinine, Ser 1.39 (*)    All other components within normal limits  BRAIN NATRIURETIC PEPTIDE - Abnormal; Notable for the following components:   B Natriuretic  Peptide >4,500.0 (*)    All other components within normal limits  BLOOD GAS, VENOUS - Abnormal; Notable for the following components:   pO2, Ven 47 (*)    All other components within normal limits  CBC WITH DIFFERENTIAL/PLATELET - Abnormal; Notable for the following components:   Hemoglobin 12.9 (*)    All other components within normal limits  URINALYSIS, ROUTINE W REFLEX MICROSCOPIC - Abnormal; Notable for the following components:   Protein, ur 100 (*)    All other components within normal limits  RAPID URINE DRUG SCREEN, HOSP PERFORMED - Abnormal; Notable for the following components:   Cocaine POSITIVE (*)    Tetrahydrocannabinol POSITIVE (*)    All other components within normal limits  I-STAT CHEM 8, ED - Abnormal; Notable for the following components:   BUN 29 (*)    Creatinine, Ser 1.40 (*)    Glucose, Bld 140 (*)    Calcium , Ion 1.13 (*)    All other components within normal limits  TROPONIN I (HIGH SENSITIVITY) - Abnormal; Notable for the following components:   Troponin I (High Sensitivity) 184 (*)    All other components within normal limits  TROPONIN I (HIGH SENSITIVITY) - Abnormal; Notable for the following components:   Troponin I (High Sensitivity) 141 (*)    All other components within normal limits  RESP PANEL BY RT-PCR (RSV, FLU A&B, COVID)  RVPGX2  MAGNESIUM     EKG EKG Interpretation Date/Time:  Wednesday December 15 2023 02:57:36 EST Ventricular Rate:  132 PR Interval:  140 QRS Duration:  96 QT Interval:  389 QTC Calculation: 577 R Axis:   78  Text Interpretation: Sinus tachycardia Probable left ventricular hypertrophy Anterior Q waves, possibly due to LVH Abnormal T, consider ischemia, lateral leads Prolonged QT interval Artifact in lead(s) I II III aVR aVL V4 V5 and baseline wander in lead(s) V4 V5 V6 Confirmed by Melvenia Motto ([id number]) on 12/15/2023 3:33:45 AM  Radiology DG Chest Port 1 View Result Date: 12/15/2023 CLINICAL DATA:  Dyspnea, hypertension  EXAM: PORTABLE CHEST 1 VIEW COMPARISON:  11/27/2023 FINDINGS: The lungs are symmetrically well inflated. Cardiac size is mildly enlarged. There is mild perihilar pulmonary edema likely reflecting mild cardiogenic failure no pneumothorax. Pleural effusion. Right internal jugular chest port tip seen within the superior vena cava. No acute bone abnormality. IMPRESSION: 1. Stable cardiomegaly. Interval development of mild cardiogenic failure. Electronically Signed   By: Dorethia Molt M.D.   On: 12/15/2023 03:23    Procedures Procedures    Medications Ordered in ED Medications  nitroGLYCERIN  50 mg in dextrose  5 % 250 mL (0.2 mg/mL) infusion (75 mcg/min Intravenous Rate/Dose  Change 12/15/23 0514)  iohexol  (OMNIPAQUE ) 350 MG/ML injection 75 mL (has no administration in time range)  albuterol  (PROVENTIL ) (2.5 MG/3ML) 0.083% nebulizer solution (10 mg/hr Nebulization Given 12/15/23 0249)  methylPREDNISolone  sodium succinate (SOLU-MEDROL ) 125 mg/2 mL injection 125 mg (125 mg Intravenous Given 12/15/23 0259)  LORazepam  (ATIVAN ) injection 1 mg (1 mg Intravenous Given 12/15/23 0259)  furosemide  (LASIX ) injection 60 mg (60 mg Intravenous Given 12/15/23 0339)  diazepam  (VALIUM ) injection 5 mg (5 mg Intravenous Given 12/15/23 0350)    ED Course/ Medical Decision Making/ A&P                                 Medical Decision Making Amount and/or Complexity of Data Reviewed Labs: ordered. Radiology: ordered.  Risk Prescription drug management.   This patient presents to the ED for concern of respiratory distress, this involves an extensive number of treatment options, and is a complaint that carries with it a high risk of complications and morbidity.  The differential diagnosis includes CHF exacerbation, pulmonary edema, reactive airway disease, pneumonia, pneumothorax, ACS, pericarditis   Co morbidities that complicate the patient evaluation  HTN, CHF, HIV, polysubstance abuse, lymphoma, anxiety   Additional  history obtained:  Additional history obtained from N/A External records from outside source obtained and reviewed including EMR   Lab Tests:  I Ordered, and personally interpreted labs.  The pertinent results include: Normal hemoglobin, no leukocytosis, creatinine slightly improved from baseline, normal electrolytes.  Troponin is elevated but consistent with prior lab work.  BNP is markedly elevated.  Patient's UDS positive for cocaine and THC.   Imaging Studies ordered:  I ordered imaging studies including chest x-ray I independently visualized and interpreted imaging which showed cardiomegaly and pulmonary edema consistent with CHF I agree with the radiologist interpretation   Cardiac Monitoring: / EKG:  The patient was maintained on a cardiac monitor.  I personally viewed and interpreted the cardiac monitored which showed an underlying rhythm of: Sinus rhythm   Problem List / ED Course / Critical interventions / Medication management  Patient presenting for respiratory distress.  On arrival, he is tachycardic, tachypneic, and hypertensive.  He has increased work of breathing, tachypnea, and diminished breath sounds on lung auscultation.  I suspect CHF exacerbation.  Patient was placed on BiPAP.  NTG gtt. was ordered.  Will treat for possible reactive airway disease component of his symptoms as well with albuterol  and Solu-Medrol .  Workup was initiated.  Patient had some mild agitation.  Ativan  and Valium  were ordered for anxiolysis.  This will also be helpful if there is any recent sympathomimetic drug use.  Patient's workup consistent with CHF exacerbation.  He had improvement in his hypertension and tachycardia.  He is tolerating BiPAP well.  Patient to be admitted for further management. I ordered medication including albuterol  and Solu-Medrol  for wheezing; Ativan  and Valium  for anxiolysis; Lasix  for diuresis; NTG for HTN Reevaluation of the patient after these medicines showed that  the patient improved I have reviewed the patients home medicines and have made adjustments as needed   Social Determinants of Health:  History of polysubstance abuse   CRITICAL CARE Performed by: Bernardino Fireman   Total critical care time: 45 minutes  Critical care time was exclusive of separately billable procedures and treating other patients.  Critical care was necessary to treat or prevent imminent or life-threatening deterioration.  Critical care was time spent personally by me on the  following activities: development of treatment plan with patient and/or surrogate as well as nursing, discussions with consultants, evaluation of patient's response to treatment, examination of patient, obtaining history from patient or surrogate, ordering and performing treatments and interventions, ordering and review of laboratory studies, ordering and review of radiographic studies, pulse oximetry and re-evaluation of patient's condition.        Final Clinical Impression(s) / ED Diagnoses Final diagnoses:  Acute on chronic congestive heart failure, unspecified heart failure type (HCC)  Acute respiratory failure with hypoxia and hypercapnia (HCC)  Hypertensive crisis    Rx / DC Orders ED Discharge Orders     None         Melvenia Motto, MD 12/15/23 [phone]

## 2023-12-15 NOTE — H&P (Addendum)
 History and Physical    Richard Cox FMW:[id number] DOB: [birth year] DOA: 12/15/2023  PCP: Terry Wilhelmena Lloyd Hilario, FNP   Patient coming from: Home  Chief Complaint: Shortness of breath  HPI: Richard Cox is a 43 y.o. male with medical history significant for hypertrophic cardiomyopathy with chronic systolic congestive heart failure, hypertension, HIV, history of B-cell lymphoma, CKD stage II, polysubstance abuse with cocaine along with medical noncompliance.  He was admitted 3 weeks ago for respiratory failure secondary to RSV.  He presented with shortness of breath and states that he ran out of his Lasix  yesterday, but otherwise states he has been compliant with his home medications.  He does admit to cocaine use in the last 2 days.  He also has had some chest pain and nonproductive cough with no edema noted.   ED Course: Vital signs with elevated blood pressure readings noted and patient was started on nitroglycerin  drip as well as BiPAP mask for respiratory distress.  He was given dose of Solu-Medrol  as well as Lasix  and some Valium  for anxiety.  He has now been able to wean off of BiPAP and is on nasal cannula.  CT PE study negative for PE, but demonstrated of possible bilateral basal pneumonia and severe LVH.  Review of Systems: Reviewed as noted above, otherwise negative.  Past Medical History:  Diagnosis Date   Acute respiratory failure with hypoxia (HCC) 08/24/2021   Anxiety    Chronic HFrEF (heart failure with reduced ejection fraction) (HCC)    CKD (chronic kidney disease) stage 2, GFR 60-89 ml/min    Essential hypertension    HIV infection (HCC)    Hypertrophic cardiomegaly    Lymphoma (HCC) 08/28/2020   Noncompliance with medication regimen    Polysubstance abuse (HCC)    Secondary cardiomyopathy (HCC)     Past Surgical History:  Procedure Laterality Date   IR IMAGING GUIDED PORT INSERTION  08/23/2020   Right   NO PAST SURGERIES       reports that he has quit smoking. His  smoking use included cigarettes. He started smoking about 21 years ago. He has a 5.3 pack-year smoking history. He has never used smokeless tobacco. He reports that he does not currently use alcohol. He reports current drug use. Drugs: Marijuana and Cocaine.  Allergies  Allergen Reactions   Motrin  [Ibuprofen ] Hypertension   Neosporin [Neomycin-Bacitracin Zn-Polymyx] Rash    Family History  Problem Relation Age of Onset   Diabetes Mellitus II Mother    Cancer Mother        multiple myeloma   Cancer Father        multiple myeloma   Congestive Heart Failure Brother    Heart Problems Brother    Lupus Brother     Prior to Admission medications   Medication Sig Start Date End Date Taking? Authorizing Provider  albuterol  (VENTOLIN  HFA) 108 (90 Base) MCG/ACT inhaler Inhale 2 puffs into the lungs every 6 (six) hours as needed for wheezing or shortness of breath. 12/14/23   Del Wilhelmena Lloyd Hilario, FNP  bictegravir-emtricitabine -tenofovir  AF (BIKTARVY ) 50-200-25 MG TABS tablet Take 1 tablet by mouth daily. 12/02/23   Calone, Gregory D, FNP  carvedilol  (COREG ) 25 MG tablet Take 1 tablet (25 mg total) by mouth 2 (two) times daily with a meal. 08/31/23 11/29/23  [place], Camellia PARAS, DO  cyclobenzaprine  (FLEXERIL ) 5 MG tablet Take 1 tablet (5 mg total) by mouth 3 (three) times daily as needed for muscle spasms. 09/01/23   Terry Wilhelmena  Polanco, Iliana, FNP  furosemide  (LASIX ) 80 MG tablet Take 1 tablet by mouth once daily 12/14/23   Del Orbe Polanco, Iliana, FNP  hydrALAZINE  (APRESOLINE ) 25 MG tablet Take 2 tablets (50 mg total) by mouth every 8 (eight) hours. 08/31/23 11/29/23  [place], Eric J, DO  isosorbide  dinitrate (ISORDIL ) 10 MG tablet Take 1 tablet (10 mg total) by mouth 3 (three) times daily. 08/31/23 11/29/23  [place], Eric J, DO    Physical Exam: Vitals:   12/15/23 0650 12/15/23 0730 12/15/23 0830 12/15/23 0900  BP: (!) 137/97 (!) 145/94 (!) 163/103 (!) 160/107  Pulse: 87 83 98 97  Resp: 12 17 (!)  30 (!) 22  Temp:      TempSrc:      SpO2: 100% 100% 96% 99%  Weight:      Height:        Constitutional: NAD, calm, comfortable Vitals:   12/15/23 0650 12/15/23 0730 12/15/23 0830 12/15/23 0900  BP: (!) 137/97 (!) 145/94 (!) 163/103 (!) 160/107  Pulse: 87 83 98 97  Resp: 12 17 (!) 30 (!) 22  Temp:      TempSrc:      SpO2: 100% 100% 96% 99%  Weight:      Height:       Eyes: lids and conjunctivae normal Neck: normal, supple Respiratory: clear to auscultation bilaterally. Normal respiratory effort. No accessory muscle use.  Currently on BiPAP. Cardiovascular: Regular rate and rhythm, no murmurs. Abdomen: no tenderness, no distention. Bowel sounds positive.  Musculoskeletal:  No edema. Skin: no rashes, lesions, ulcers.  Psychiatric: Flat affect  Labs on Admission: I have personally reviewed following labs and imaging studies  CBC: Recent Labs  Lab 12/15/23 0301 12/15/23 0326  WBC 9.8  --   NEUTROABS 5.6  --   HGB 12.9* 13.6  HCT 41.2 40.0  MCV 94.3  --   PLT 267  --    Basic Metabolic Panel: Recent Labs  Lab 12/15/23 0301 12/15/23 0326  NA 139 140  K 4.1 4.0  CL 103 106  CO2 24  --   GLUCOSE 126* 140*  BUN 28* 29*  CREATININE 1.39* 1.40*  CALCIUM  9.5  --   MG 2.4  --    GFR: Estimated Creatinine Clearance: 68 mL/min (A) (by C-G formula based on SCr of 1.4 mg/dL (H)). Liver Function Tests: Recent Labs  Lab 12/15/23 0301  AST 25  ALT 17  ALKPHOS 79  BILITOT 0.7  PROT 8.1  ALBUMIN 3.7   No results for input(s): LIPASE, AMYLASE in the last 168 hours. No results for input(s): AMMONIA in the last 168 hours. Coagulation Profile: No results for input(s): INR, PROTIME in the last 168 hours. Cardiac Enzymes: No results for input(s): CKTOTAL, CKMB, CKMBINDEX, TROPONINI in the last 168 hours. BNP (last 3 results) No results for input(s): PROBNP in the last 8760 hours. HbA1C: No results for input(s): HGBA1C in the last 72  hours. CBG: No results for input(s): GLUCAP in the last 168 hours. Lipid Profile: No results for input(s): CHOL, HDL, LDLCALC, TRIG, CHOLHDL, LDLDIRECT in the last 72 hours. Thyroid Function Tests: No results for input(s): TSH, T4TOTAL, FREET4, T3FREE, THYROIDAB in the last 72 hours. Anemia Panel: No results for input(s): VITAMINB12, FOLATE, FERRITIN, TIBC, IRON, RETICCTPCT in the last 72 hours. Urine analysis:    Component Value Date/Time   COLORURINE YELLOW 12/15/2023 0256   APPEARANCEUR CLEAR 12/15/2023 0256   LABSPEC 1.017 12/15/2023 0256   PHURINE 5.0 12/15/2023 0256  GLUCOSEU NEGATIVE 12/15/2023 0256   HGBUR NEGATIVE 12/15/2023 0256   BILIRUBINUR NEGATIVE 12/15/2023 0256   KETONESUR NEGATIVE 12/15/2023 0256   PROTEINUR 100 (A) 12/15/2023 0256   NITRITE NEGATIVE 12/15/2023 0256   LEUKOCYTESUR NEGATIVE 12/15/2023 0256    Radiological Exams on Admission: CT Angio Chest PE W and/or Wo Contrast Result Date: 12/15/2023 CLINICAL DATA:  43 year old male with history of HIV, polysubstance abuse, lymphoma and respiratory failure secondary to RSV. Shortness of breath. Clinical suspicion for pulmonary embolism. EXAM: CT ANGIOGRAPHY CHEST WITH CONTRAST TECHNIQUE: Multidetector CT imaging of the chest was performed using the standard protocol during bolus administration of intravenous contrast. Multiplanar CT image reconstructions and MIPs were obtained to evaluate the vascular anatomy. RADIATION DOSE REDUCTION: This exam was performed according to the departmental dose-optimization program which includes automated exposure control, adjustment of the mA and/or kV according to patient size and/or use of iterative reconstruction technique. CONTRAST:  75mL OMNIPAQUE  IOHEXOL  350 MG/ML SOLN COMPARISON:  Chest CTA 05/21/2023. FINDINGS: Cardiovascular: No filling defects are noted in the pulmonary arterial tree to suggest pulmonary embolism. Heart size is enlarged with  severe left ventricular hypertrophy. There is no significant pericardial fluid, thickening or pericardial calcification. No atherosclerotic calcifications are noted in the thoracic aorta or the coronary arteries. Right internal jugular single-lumen Port-A-Cath with tip terminating at the superior cavoatrial junction. Mediastinum/Nodes: Numerous prominent borderline enlarged mediastinal lymph nodes are noted, nonspecific. No definite pathologic mediastinal or bilateral hilar lymph nodes are noted. Esophagus is unremarkable in appearance. No axillary lymphadenopathy. Lungs/Pleura: Patchy multifocal airspace consolidation noted in the lower lobes of the lungs bilaterally. Trace right pleural effusion lying dependently. No left pleural effusion. No definite suspicious appearing pulmonary nodules or masses are noted. Upper Abdomen: Prominent nodal tissue adjacent to the abdominal aorta measuring up to 1.1 cm in short axis. Musculoskeletal: There are no aggressive appearing lytic or blastic lesions noted in the visualized portions of the skeleton. Review of the MIP images confirms the above findings. IMPRESSION: 1. No evidence of pulmonary embolism. 2. Patchy multifocal airspace consolidation in the lower lobes of the lungs bilaterally, concerning for multifocal pneumonia (potentially sequela of aspiration given the predominantly dependent nature of the findings). 3. Trace right pleural effusion lying dependently. 4. Cardiomegaly with severe left ventricular hypertrophy. 5. Nonspecific prominent borderline enlarged mediastinal lymph nodes and prominent nodal tissue adjacent to the abdominal aorta, similar to prior studies. Electronically Signed   By: Toribio Aye M.D.   On: 12/15/2023 09:02   DG Chest Port 1 View Result Date: 12/15/2023 CLINICAL DATA:  Dyspnea, hypertension EXAM: PORTABLE CHEST 1 VIEW COMPARISON:  11/27/2023 FINDINGS: The lungs are symmetrically well inflated. Cardiac size is mildly enlarged. There  is mild perihilar pulmonary edema likely reflecting mild cardiogenic failure no pneumothorax. Pleural effusion. Right internal jugular chest port tip seen within the superior vena cava. No acute bone abnormality. IMPRESSION: 1. Stable cardiomegaly. Interval development of mild cardiogenic failure. Electronically Signed   By: Dorethia Molt M.D.   On: 12/15/2023 03:23    EKG: Independently reviewed.  ST 132 bpm with LVH.  Assessment/Plan Principal Problem:   Acute respiratory failure with hypoxia (HCC) Active Problems:   Acute on chronic combined systolic and diastolic CHF (congestive heart failure) (HCC)   Hypertensive crisis   Substance abuse (HCC)   AIDS (acquired immune deficiency syndrome) (HCC)   High grade B-cell lymphoma (HCC)   Elevated troponin   Cocaine abuse (HCC)   Anxiety    Acute hypoxemic respiratory failure  secondary to combined systolic and diastolic CHF exacerbation in the setting of hypertensive crisis -Prior 2D echocardiogram 02/2023 reveals LVEF 25-30% with severe LVH and grade 1 diastolic dysfunction -Plan to repeat 2D echocardiogram during this admission -Continue IV Lasix  for diuresis and monitor strict I's and O's and daily weights -Wean oxygen to room air as tolerated  Hypertensive crisis -Likely related to cocaine use as well as medication noncompliance -Continue on nitroglycerin  drip -Use IV hydralazine  to help wean off drip -Continue home medications as previously prescribed, and use IV Lasix  instead of oral Lasix  for volume overload  Elevated troponins -Secondary to above -Echocardiogram will be obtained  CKD stage II -Creatinine at baseline, continue to monitor  HIV/AIDS -Continue Biktarvy   B-cell lymphoma in remission -Outpatient follow-up with medical oncology  Polysubstance abuse -Admits to cocaine use 2 days ago -UDS positive for THC and cocaine -Counseled on cessation   DVT prophylaxis: Heparin  Code Status: Full Family  Communication: None at bedside Disposition Plan: Admit for hypertensive crisis with hypoxemia Consults called: None Admission status: Inpatient, stepdown  Severity of Illness: The appropriate patient status for this patient is INPATIENT. Inpatient status is judged to be reasonable and necessary in order to provide the required intensity of service to ensure the patient's safety. The patient's presenting symptoms, physical exam findings, and initial radiographic and laboratory data in the context of their chronic comorbidities is felt to place them at high risk for further clinical deterioration. Furthermore, it is not anticipated that the patient will be medically stable for discharge from the hospital within 2 midnights of admission.   * I certify that at the point of admission it is my clinical judgment that the patient will require inpatient hospital care spanning beyond 2 midnights from the point of admission due to high intensity of service, high risk for further deterioration and high frequency of surveillance required.*   Ferry Matthis D Kayli Beal DO [facility]  If 7PM-7AM, please contact night-coverage [email or web address]  12/15/2023, 10:03 AM

## 2023-12-15 NOTE — Progress Notes (Signed)
 Patient's CAT is almost completed.  Patient on Bipap 10/5, BUR 15, 40% but patient has high anxiety about being on Bipap machine.  Have encouraged patient to try to relax and take deeper breaths to try and get rate down.  Patient will do good while you are in the room talking with him but it increases once you leave room.  Patient currently in room in tripod position, patient has received meds for anxiety.

## 2023-12-15 NOTE — ED Notes (Signed)
 Pt wanting to leave. Pt apologizes but states he has a dog at home that he has no one to take care of. DR Mason Sole aware and will have pt sign ama per vo dr Mason Sole

## 2023-12-15 NOTE — ED Triage Notes (Signed)
 Pt c/o increased sob that started today; pt has non-productive cough

## 2023-12-15 NOTE — Discharge Summary (Signed)
 Physician Discharge Summary  Ladarian Bonczek FMW:[id number] DOB: May 30, 1981 DOA: 12/15/2023  PCP: Terry Wilhelmena Lloyd Hilario, FNP  Admit date: 12/15/2023  Discharge date: 12/15/2023 AMA DISCHARGE  Admitted From:Home  Disposition:  AMA DISCHARGE  Recommendations for Outpatient Follow-up:  Patient given refills on Lasix   Discharge Condition:Stable  CODE STATUS: Full  Diet recommendation: Heart Healthy  Brief/Interim Summary: Richard Cox is a 43 y.o. male with medical history significant for hypertrophic cardiomyopathy with chronic systolic congestive heart failure, hypertension, HIV, history of B-cell lymphoma, CKD stage II, polysubstance abuse with cocaine along with medical noncompliance.  He was admitted 3 weeks ago for respiratory failure secondary to RSV.  He presented with shortness of breath and states that he ran out of his Lasix  yesterday, but otherwise states he has been compliant with his home medications.  He does admit to cocaine use in the last 2 days.  He was admitted for acute hypoxemic respiratory failure secondary to combined systolic and diastolic CHF exacerbation in the setting of hypertensive crisis related to medication noncompliance as well as cocaine use.  He has now been weaned off of nitroglycerin  drip and oxygen and is requesting discharge.  I have explained to him that he will require repeat 2D echocardiogram as well as stabilization of his blood pressures, but he states that he must leave to care for his dog.  He is perfectly alert and oriented x 3 and understand the risks of doing so, but requests that he have refills for Lasix  which have been provided.  He remains a high risk for readmission based on his medical noncompliance as well as history of polysubstance abuse.  He has signed paperwork and will leave AGAINST MEDICAL ADVICE.  Discharge Diagnoses:  Principal Problem:   Acute respiratory failure with hypoxia (HCC) Active Problems:   Acute on chronic combined systolic and  diastolic CHF (congestive heart failure) (HCC)   Hypertensive crisis   Substance abuse (HCC)   AIDS (acquired immune deficiency syndrome) (HCC)   High grade B-cell lymphoma (HCC)   Elevated troponin   Cocaine abuse (HCC)   Anxiety  Principal discharge diagnosis: Acute hypoxemic respiratory failure secondary to combined systolic and diastolic CHF exacerbation in the setting of hypertensive crisis.    Allergies  Allergen Reactions   Motrin  [Ibuprofen ] Hypertension   Neosporin [Neomycin-Bacitracin Zn-Polymyx] Rash    Consultations: None   Procedures/Studies: CT Angio Chest PE W and/or Wo Contrast Result Date: 12/15/2023 CLINICAL DATA:  43 year old male with history of HIV, polysubstance abuse, lymphoma and respiratory failure secondary to RSV. Shortness of breath. Clinical suspicion for pulmonary embolism. EXAM: CT ANGIOGRAPHY CHEST WITH CONTRAST TECHNIQUE: Multidetector CT imaging of the chest was performed using the standard protocol during bolus administration of intravenous contrast. Multiplanar CT image reconstructions and MIPs were obtained to evaluate the vascular anatomy. RADIATION DOSE REDUCTION: This exam was performed according to the departmental dose-optimization program which includes automated exposure control, adjustment of the mA and/or kV according to patient size and/or use of iterative reconstruction technique. CONTRAST:  75mL OMNIPAQUE  IOHEXOL  350 MG/ML SOLN COMPARISON:  Chest CTA 05/21/2023. FINDINGS: Cardiovascular: No filling defects are noted in the pulmonary arterial tree to suggest pulmonary embolism. Heart size is enlarged with severe left ventricular hypertrophy. There is no significant pericardial fluid, thickening or pericardial calcification. No atherosclerotic calcifications are noted in the thoracic aorta or the coronary arteries. Right internal jugular single-lumen Port-A-Cath with tip terminating at the superior cavoatrial junction. Mediastinum/Nodes: Numerous  prominent borderline enlarged mediastinal lymph nodes are  noted, nonspecific. No definite pathologic mediastinal or bilateral hilar lymph nodes are noted. Esophagus is unremarkable in appearance. No axillary lymphadenopathy. Lungs/Pleura: Patchy multifocal airspace consolidation noted in the lower lobes of the lungs bilaterally. Trace right pleural effusion lying dependently. No left pleural effusion. No definite suspicious appearing pulmonary nodules or masses are noted. Upper Abdomen: Prominent nodal tissue adjacent to the abdominal aorta measuring up to 1.1 cm in short axis. Musculoskeletal: There are no aggressive appearing lytic or blastic lesions noted in the visualized portions of the skeleton. Review of the MIP images confirms the above findings. IMPRESSION: 1. No evidence of pulmonary embolism. 2. Patchy multifocal airspace consolidation in the lower lobes of the lungs bilaterally, concerning for multifocal pneumonia (potentially sequela of aspiration given the predominantly dependent nature of the findings). 3. Trace right pleural effusion lying dependently. 4. Cardiomegaly with severe left ventricular hypertrophy. 5. Nonspecific prominent borderline enlarged mediastinal lymph nodes and prominent nodal tissue adjacent to the abdominal aorta, similar to prior studies. Electronically Signed   By: Toribio Aye M.D.   On: 12/15/2023 09:02   DG Chest Port 1 View Result Date: 12/15/2023 CLINICAL DATA:  Dyspnea, hypertension EXAM: PORTABLE CHEST 1 VIEW COMPARISON:  11/27/2023 FINDINGS: The lungs are symmetrically well inflated. Cardiac size is mildly enlarged. There is mild perihilar pulmonary edema likely reflecting mild cardiogenic failure no pneumothorax. Pleural effusion. Right internal jugular chest port tip seen within the superior vena cava. No acute bone abnormality. IMPRESSION: 1. Stable cardiomegaly. Interval development of mild cardiogenic failure. Electronically Signed   By: Dorethia Molt M.D.    On: 12/15/2023 03:23   DG Chest 2 View Result Date: 11/27/2023 CLINICAL DATA:  Shortness of breath, cough EXAM: CHEST - 2 VIEW COMPARISON:  Chest radiograph dated 08/28/2023. FINDINGS: A right internal jugular central venous port catheter tip overlies the superior vena cava. The heart size is enlarged. Both lungs are clear. The visualized skeletal structures are unremarkable. IMPRESSION: No active cardiopulmonary disease. Electronically Signed   By: Norman Hopper M.D.   On: 11/27/2023 12:29     Discharge Exam: Vitals:   12/15/23 1500 12/15/23 1600  BP: 131/86 (!) 142/98  Pulse: 84 87  Resp: (!) 22 (!) 25  Temp:    SpO2: 92%    Vitals:   12/15/23 1343 12/15/23 1415 12/15/23 1500 12/15/23 1600  BP: (!) 141/88 135/77 131/86 (!) 142/98  Pulse: 84 86 84 87  Resp:  (!) 21 (!) 22 (!) 25  Temp:      TempSrc:      SpO2: 95% 92% 92%   Weight:      Height:        General: Pt is alert, awake, not in acute distress Cardiovascular: RRR, S1/S2 +, no rubs, no gallops Respiratory: CTA bilaterally, no wheezing, no rhonchi Abdominal: Soft, NT, ND, bowel sounds + Extremities: no edema, no cyanosis    The results of significant diagnostics from this hospitalization (including imaging, microbiology, ancillary and laboratory) are listed below for reference.     Microbiology: Recent Results (from the past 240 hours)  Resp panel by RT-PCR (RSV, Flu A&B, Covid) Anterior Nasal Swab     Status: None   Collection Time: 12/15/23  3:13 AM   Specimen: Anterior Nasal Swab  Result Value Ref Range Status   SARS Coronavirus 2 by RT PCR NEGATIVE NEGATIVE Final    Comment: (NOTE) SARS-CoV-2 target nucleic acids are NOT DETECTED.  The SARS-CoV-2 RNA is generally detectable in upper respiratory specimens during  the acute phase of infection. The lowest concentration of SARS-CoV-2 viral copies this assay can detect is 138 copies/mL. A negative result does not preclude SARS-Cov-2 infection and should not  be used as the sole basis for treatment or other patient management decisions. A negative result may occur with  improper specimen collection/handling, submission of specimen other than nasopharyngeal swab, presence of viral mutation(s) within the areas targeted by this assay, and inadequate number of viral copies(<138 copies/mL). A negative result must be combined with clinical observations, patient history, and epidemiological information. The expected result is Negative.  Fact Sheet for Patients:  bloggercourse.com  Fact Sheet for Healthcare Providers:  seriousbroker.it  This test is no t yet approved or cleared by the [place]  FDA and  has been authorized for detection and/or diagnosis of SARS-CoV-2 by FDA under an Emergency Use Authorization (EUA). This EUA will remain  in effect (meaning this test can be used) for the duration of the COVID-19 declaration under Section 564(b)(1) of the Act, 21 U.S.C.section 360bbb-3(b)(1), unless the authorization is terminated  or revoked sooner.       Influenza A by PCR NEGATIVE NEGATIVE Final   Influenza B by PCR NEGATIVE NEGATIVE Final    Comment: (NOTE) The Xpert Xpress SARS-CoV-2/FLU/RSV plus assay is intended as an aid in the diagnosis of influenza from Nasopharyngeal swab specimens and should not be used as a sole basis for treatment. Nasal washings and aspirates are unacceptable for Xpert Xpress SARS-CoV-2/FLU/RSV testing.  Fact Sheet for Patients: bloggercourse.com  Fact Sheet for Healthcare Providers: seriousbroker.it  This test is not yet approved or cleared by the [place]  FDA and has been authorized for detection and/or diagnosis of SARS-CoV-2 by FDA under an Emergency Use Authorization (EUA). This EUA will remain in effect (meaning this test can be used) for the duration of the COVID-19 declaration under Section  564(b)(1) of the Act, 21 U.S.C. section 360bbb-3(b)(1), unless the authorization is terminated or revoked.     Resp Syncytial Virus by PCR NEGATIVE NEGATIVE Final    Comment: (NOTE) Fact Sheet for Patients: bloggercourse.com  Fact Sheet for Healthcare Providers: seriousbroker.it  This test is not yet approved or cleared by the [place]  FDA and has been authorized for detection and/or diagnosis of SARS-CoV-2 by FDA under an Emergency Use Authorization (EUA). This EUA will remain in effect (meaning this test can be used) for the duration of the COVID-19 declaration under Section 564(b)(1) of the Act, 21 U.S.C. section 360bbb-3(b)(1), unless the authorization is terminated or revoked.  Performed at [facility], [address] [place], [place] [address]      Labs: BNP (last 3 results) Recent Labs    08/31/23 0748 11/27/23 1143 12/15/23 0301  BNP 2,258.1* 1,126.0* >4,500.0*   Basic Metabolic Panel: Recent Labs  Lab 12/15/23 0301 12/15/23 0326  NA 139 140  K 4.1 4.0  CL 103 106  CO2 24  --   GLUCOSE 126* 140*  BUN 28* 29*  CREATININE 1.39* 1.40*  CALCIUM  9.5  --   MG 2.4  --    Liver Function Tests: Recent Labs  Lab 12/15/23 0301  AST 25  ALT 17  ALKPHOS 79  BILITOT 0.7  PROT 8.1  ALBUMIN 3.7   No results for input(s): LIPASE, AMYLASE in the last 168 hours. No results for input(s): AMMONIA in the last 168 hours. CBC: Recent Labs  Lab 12/15/23 0301 12/15/23 0326  WBC 9.8  --   NEUTROABS 5.6  --  HGB 12.9* 13.6  HCT 41.2 40.0  MCV 94.3  --   PLT 267  --    Cardiac Enzymes: No results for input(s): CKTOTAL, CKMB, CKMBINDEX, TROPONINI in the last 168 hours. BNP: Invalid input(s): POCBNP CBG: No results for input(s): GLUCAP in the last 168 hours. D-Dimer No results for input(s): DDIMER in the last 72 hours. Hgb A1c No results for input(s): HGBA1C in the last 72  hours. Lipid Profile No results for input(s): CHOL, HDL, LDLCALC, TRIG, CHOLHDL, LDLDIRECT in the last 72 hours. Thyroid function studies No results for input(s): TSH, T4TOTAL, T3FREE, THYROIDAB in the last 72 hours.  Invalid input(s): FREET3 Anemia work up No results for input(s): VITAMINB12, FOLATE, FERRITIN, TIBC, IRON, RETICCTPCT in the last 72 hours. Urinalysis    Component Value Date/Time   COLORURINE YELLOW 12/15/2023 0256   APPEARANCEUR CLEAR 12/15/2023 0256   LABSPEC 1.017 12/15/2023 0256   PHURINE 5.0 12/15/2023 0256   GLUCOSEU NEGATIVE 12/15/2023 0256   HGBUR NEGATIVE 12/15/2023 0256   BILIRUBINUR NEGATIVE 12/15/2023 0256   KETONESUR NEGATIVE 12/15/2023 0256   PROTEINUR 100 (A) 12/15/2023 0256   NITRITE NEGATIVE 12/15/2023 0256   LEUKOCYTESUR NEGATIVE 12/15/2023 0256   Sepsis Labs Recent Labs  Lab 12/15/23 0301  WBC 9.8   Microbiology Recent Results (from the past 240 hours)  Resp panel by RT-PCR (RSV, Flu A&B, Covid) Anterior Nasal Swab     Status: None   Collection Time: 12/15/23  3:13 AM   Specimen: Anterior Nasal Swab  Result Value Ref Range Status   SARS Coronavirus 2 by RT PCR NEGATIVE NEGATIVE Final    Comment: (NOTE) SARS-CoV-2 target nucleic acids are NOT DETECTED.  The SARS-CoV-2 RNA is generally detectable in upper respiratory specimens during the acute phase of infection. The lowest concentration of SARS-CoV-2 viral copies this assay can detect is 138 copies/mL. A negative result does not preclude SARS-Cov-2 infection and should not be used as the sole basis for treatment or other patient management decisions. A negative result may occur with  improper specimen collection/handling, submission of specimen other than nasopharyngeal swab, presence of viral mutation(s) within the areas targeted by this assay, and inadequate number of viral copies(<138 copies/mL). A negative result must be combined with clinical  observations, patient history, and epidemiological information. The expected result is Negative.  Fact Sheet for Patients:  bloggercourse.com  Fact Sheet for Healthcare Providers:  seriousbroker.it  This test is no t yet approved or cleared by the [place]  FDA and  has been authorized for detection and/or diagnosis of SARS-CoV-2 by FDA under an Emergency Use Authorization (EUA). This EUA will remain  in effect (meaning this test can be used) for the duration of the COVID-19 declaration under Section 564(b)(1) of the Act, 21 U.S.C.section 360bbb-3(b)(1), unless the authorization is terminated  or revoked sooner.       Influenza A by PCR NEGATIVE NEGATIVE Final   Influenza B by PCR NEGATIVE NEGATIVE Final    Comment: (NOTE) The Xpert Xpress SARS-CoV-2/FLU/RSV plus assay is intended as an aid in the diagnosis of influenza from Nasopharyngeal swab specimens and should not be used as a sole basis for treatment. Nasal washings and aspirates are unacceptable for Xpert Xpress SARS-CoV-2/FLU/RSV testing.  Fact Sheet for Patients: bloggercourse.com  Fact Sheet for Healthcare Providers: seriousbroker.it  This test is not yet approved or cleared by the [place]  FDA and has been authorized for detection and/or diagnosis of SARS-CoV-2 by FDA under an Emergency Use Authorization (EUA). This  EUA will remain in effect (meaning this test can be used) for the duration of the COVID-19 declaration under Section 564(b)(1) of the Act, 21 U.S.C. section 360bbb-3(b)(1), unless the authorization is terminated or revoked.     Resp Syncytial Virus by PCR NEGATIVE NEGATIVE Final    Comment: (NOTE) Fact Sheet for Patients: bloggercourse.com  Fact Sheet for Healthcare Providers: seriousbroker.it  This test is not yet approved or cleared by  the [place]  FDA and has been authorized for detection and/or diagnosis of SARS-CoV-2 by FDA under an Emergency Use Authorization (EUA). This EUA will remain in effect (meaning this test can be used) for the duration of the COVID-19 declaration under Section 564(b)(1) of the Act, 21 U.S.C. section 360bbb-3(b)(1), unless the authorization is terminated or revoked.  Performed at [facility], [address], [place], [place] [address]      Time coordinating discharge: 35 minutes  SIGNED:   Adron JONETTA Fairly, DO [facility] 12/15/2023, 5:17 PM  If 7PM-7AM, please contact night-coverage [email or web address]

## 2023-12-15 NOTE — ED Notes (Signed)
Dr Sherryll Burger in room with pt at present.

## 2023-12-16 ENCOUNTER — Telehealth: Payer: Self-pay

## 2023-12-16 NOTE — Transitions of Care (Post Inpatient/ED Visit) (Signed)
   12/16/2023  Name: Richard Cox MRN: [id number] DOB: 27-Mar-1981  Today's TOC FU Call Status: Today's TOC FU Call Status:: Unsuccessful Call (1st Attempt) Unsuccessful Call (1st Attempt) Date: 12/16/23  Attempted to reach the patient regarding the most recent Inpatient/ED visit. Left a voicemail.  Follow Up Plan: Additional outreach attempts will be made to reach the patient to complete the Transitions of Care (Post Inpatient/ED visit) call.   Alan Ee, RN, BSN, CEN [employer]- Transition of Care Team.  [facility] [phone]

## 2023-12-17 ENCOUNTER — Telehealth: Payer: Self-pay

## 2023-12-17 ENCOUNTER — Institutional Professional Consult (permissible substitution): Payer: Medicare Other | Admitting: Professional Counselor

## 2023-12-17 NOTE — Transitions of Care (Post Inpatient/ED Visit) (Signed)
   12/17/2023  Name: Richard Cox MRN: [id number] DOB: 05/25/1981  Today's TOC FU Call Status: Today's TOC FU Call Status:: Unsuccessful Call (2nd Attempt) Unsuccessful Call (2nd Attempt) Date: 12/17/23  Attempted to reach the patient regarding the most recent Inpatient/ED visit.  Follow Up Plan: Additional outreach attempts will be made to reach the patient to complete the Transitions of Care (Post Inpatient/ED visit) call.   Alan Ee, RN, BSN, CEN [employer]- Transition of Care Team.  [facility] [phone]

## 2023-12-20 ENCOUNTER — Telehealth: Payer: Self-pay

## 2023-12-20 NOTE — Transitions of Care (Post Inpatient/ED Visit) (Signed)
   12/20/2023  Name: Richard Cox MRN: [id number] DOB: 1981-10-09  Today's TOC FU Call Status: Today's TOC FU Call Status:: Unsuccessful Call (3rd Attempt) Unsuccessful Call (3rd Attempt) Date: 12/20/23  Attempted to reach the patient regarding the most recent Inpatient/ED visit.  Follow Up Plan: No further outreach attempts will be made at this time. We have been unable to contact the patient.  Orpha Blade, RN, BSN, CEN [employer]- Transition of Care Team.  [facility] [phone]

## 2023-12-28 ENCOUNTER — Telehealth: Payer: Self-pay

## 2023-12-28 ENCOUNTER — Other Ambulatory Visit: Payer: Self-pay | Admitting: Family Medicine

## 2023-12-28 MED ORDER — CARVEDILOL 25 MG PO TABS: 25.0000 mg | ORAL_TABLET | Freq: Two times a day (BID) | ORAL | 1 refills | Status: DC

## 2023-12-28 MED ORDER — HYDRALAZINE HCL 25 MG PO TABS: 50.0000 mg | ORAL_TABLET | Freq: Three times a day (TID) | ORAL | 1 refills | Status: DC

## 2023-12-28 MED ORDER — ISOSORBIDE DINITRATE 10 MG PO TABS: 10.0000 mg | ORAL_TABLET | Freq: Three times a day (TID) | ORAL | 1 refills | Status: DC

## 2023-12-28 NOTE — Telephone Encounter (Unsigned)
Copied from CRM [id number]. Topic: Clinical - Prescription Issue >> Dec 28, 2023  9:29 AM Prudencio Pair wrote: Reason for CRM: Patient states he was told by pharmacy that he has no refills left on his blood pressure medication. He states that Dr. Tenna Child prescribed it. The only medication that is listed is hydrALAZINE (APRESOLINE) 25 MG tablet [[id number]] ENDED. He states it the 50 or 100 mg. Prescription was not originally prescribed by Dr. Tenna Child. Patient states he is out of medication & needs it refilled.

## 2023-12-28 NOTE — Telephone Encounter (Signed)
Refilled his cardiac medications for one month , patient needs to follow up with is cardiologist for follow up on his march appointment

## 2023-12-29 ENCOUNTER — Ambulatory Visit: Payer: Medicare Other | Admitting: Family

## 2023-12-29 NOTE — Telephone Encounter (Signed)
Contacted pt. To inform of medication refills. No answer. Lvm for him to check w/ pharmacy for refills and to keep his cardiology appt. In March.

## 2023-12-30 ENCOUNTER — Encounter: Payer: Self-pay | Admitting: Family Medicine

## 2023-12-30 ENCOUNTER — Other Ambulatory Visit: Payer: Self-pay | Admitting: Family Medicine

## 2023-12-30 DIAGNOSIS — I509 Heart failure, unspecified: Secondary | ICD-10-CM

## 2023-12-30 MED ORDER — FUROSEMIDE 80 MG PO TABS: 80.0000 mg | ORAL_TABLET | Freq: Every day | ORAL | 0 refills | Status: DC

## 2023-12-30 NOTE — Telephone Encounter (Signed)
 sent

## 2023-12-31 ENCOUNTER — Other Ambulatory Visit: Payer: Self-pay | Admitting: Family

## 2023-12-31 DIAGNOSIS — B20 Human immunodeficiency virus [HIV] disease: Secondary | ICD-10-CM

## 2024-01-04 ENCOUNTER — Ambulatory Visit: Payer: Medicare Other | Admitting: Family

## 2024-01-04 ENCOUNTER — Telehealth: Payer: Self-pay

## 2024-01-04 NOTE — Telephone Encounter (Signed)
 Attempted to call patient regarding missed appt. Not able to reach him at this time. Left vm requesting call back. Juanita Laster, RMA

## 2024-01-14 ENCOUNTER — Ambulatory Visit: Payer: Medicare Other | Admitting: Nurse Practitioner

## 2024-01-14 ENCOUNTER — Telehealth: Payer: Self-pay | Admitting: Nurse Practitioner

## 2024-01-14 DIAGNOSIS — I509 Heart failure, unspecified: Secondary | ICD-10-CM

## 2024-01-14 MED ORDER — FUROSEMIDE 80 MG PO TABS: 80.0000 mg | ORAL_TABLET | Freq: Every day | ORAL | 1 refills | Status: DC

## 2024-01-14 NOTE — Telephone Encounter (Signed)
*  STAT* If patient is at the pharmacy, call can be transferred to refill team.   1. Which medications need to be refilled? (please list name of each medication and dose if known) Furosemide   2. Would you like to learn more about the convenience, safety, & potential cost savings by using the [facility]?     3. Are you open to using the [facility] (Type [facility].    4. Which pharmacy/location (including street and city if local pharmacy) is medication to be sent to? [facility] [address] [place]   5. Do they need a 30 day or 90 day supply? Need enough his appointment on 03-13-24 with Sharlene Dory

## 2024-01-14 NOTE — Telephone Encounter (Signed)
 Done - only filled enough tabs to get to next visit.

## 2024-02-22 MED ORDER — FUROSEMIDE 80 MG PO TABS: 80.0000 mg | ORAL_TABLET | Freq: Every day | ORAL | 0 refills | Status: DC

## 2024-02-22 NOTE — Telephone Encounter (Signed)
 Patient says he just took last tablet this morning and would like an additional refill if possible.

## 2024-02-22 NOTE — Telephone Encounter (Signed)
 Filled until appointment no more fills until seen in office

## 2024-02-22 NOTE — Addendum Note (Signed)
 Addended by: Durwood Dittus on: 02/22/2024 08:13 AM   Modules accepted: Orders

## 2024-03-09 ENCOUNTER — Other Ambulatory Visit: Payer: Self-pay | Admitting: Cardiology

## 2024-03-09 DIAGNOSIS — I509 Heart failure, unspecified: Secondary | ICD-10-CM

## 2024-03-09 MED ORDER — FUROSEMIDE 80 MG PO TABS: 80.0000 mg | ORAL_TABLET | Freq: Every day | ORAL | 0 refills | Status: DC

## 2024-03-13 ENCOUNTER — Encounter: Payer: Self-pay | Admitting: Nurse Practitioner

## 2024-03-13 ENCOUNTER — Ambulatory Visit: Attending: Nurse Practitioner | Admitting: Nurse Practitioner

## 2024-03-13 VITALS — BP 141/90 | HR 74 | Ht 73.0 in | Wt 164.0 lb

## 2024-03-13 DIAGNOSIS — Z8249 Family history of ischemic heart disease and other diseases of the circulatory system: Secondary | ICD-10-CM | POA: Diagnosis present

## 2024-03-13 DIAGNOSIS — I10 Essential (primary) hypertension: Secondary | ICD-10-CM | POA: Insufficient documentation

## 2024-03-13 DIAGNOSIS — I421 Obstructive hypertrophic cardiomyopathy: Secondary | ICD-10-CM | POA: Diagnosis present

## 2024-03-13 DIAGNOSIS — I5042 Chronic combined systolic (congestive) and diastolic (congestive) heart failure: Secondary | ICD-10-CM | POA: Diagnosis present

## 2024-03-13 DIAGNOSIS — I509 Heart failure, unspecified: Secondary | ICD-10-CM

## 2024-03-13 DIAGNOSIS — I5043 Acute on chronic combined systolic (congestive) and diastolic (congestive) heart failure: Secondary | ICD-10-CM

## 2024-03-13 DIAGNOSIS — F1910 Other psychoactive substance abuse, uncomplicated: Secondary | ICD-10-CM | POA: Diagnosis present

## 2024-03-13 MED ORDER — FUROSEMIDE 80 MG PO TABS
80.0000 mg | ORAL_TABLET | Freq: Two times a day (BID) | ORAL | 2 refills | Status: DC
Start: 2024-03-13 — End: 2024-09-04

## 2024-03-13 NOTE — Progress Notes (Unsigned)
 Cardiology Office Note:  .   Date:  03/13/2024 ID:  Richard Cox, DOB 01-10-1981, MRN [id number] PCP: Rosanna Comment, FNP  [facility] Providers Cardiologist:  Teddie Favre, MD    History of Present Illness: .   Richard Cox is a 43 y.o. male with a PMH of CHF, HTN, lymphoma, anxiety, HIV, polysubstance abuse, who presents today for overdue TOC follow-up.   Hospitalized for respiratory failure d/t RSV 11/2023.   Hospitalized 12/15/2023 ED in Feb 2025 d/t shortness of breath. Forgot to take Lasix . On arrival to ED, was hypertensive, tachycardic, and tachypneic. UDS positive for cocaine and marijuana. BNP elevated > 4,000. Trops elevated. Respiratory viral panel negative. CT angio chest neg for PE, evidence of multifocal PNA, CM with severe LVH, see full report below. Admitted and treated for acute hypoxemic respiratory failure secondary to combined CHF exacerbation in setting of hypertensive crisis related to medication noncompliance and cocaine use. Was weaned of nitroglycerin  drip and O2, was requesting d/c. Was requested that a repeat 2D echo was required and BP needed to be stabilized. Left AMA.   Today he presents for overdue hospital follow-up. Pt was no show several times for office evaluation prior to arriving today. Doing well. Denies any chest pain, shortness of breath, palpitations, syncope, presyncope, dizziness, orthopnea, PND, swelling or significant weight changes, acute bleeding, or claudication. Admits to shortness of breath when he does not take his Lasix . Currently he has doubled his Lasix  dosage because he did not get relief of symptoms from previous dosage. Currently taking Lasix  80 mg BID. He is requesting a refill of Lasix  today. Does not check BP at home.    SH: Admits to smoking cigars about twice per week. Denies any alcohol use. Admits to "very infrequent" illicit drug use, including cocaine and marijuana.   FH: Both parents are living. Mother had a stroke 3  weeks ago, getting better, also past hx of MI. Oldest brother passed away 2 years ago at age 41 years old, stated he had heart disease and CHF, known hx of CABG and 2 previous MI's.  ROS: Negative. See HPI.   Studies Reviewed: Aaron Aas    EKG is not ordered today.   Echo 02/2023:  1. Left ventricular ejection fraction, by estimation, is 25 to 30%. The  left ventricle has severely decreased function. The left ventricle  demonstrates global hypokinesis. There is severe asymmetric left  ventricular hypertrophy of the septal segment,  also involving RV. Consistent with hypertrophic cardiomyopathy, although no obvious LVOT gradient to suggest obstruction at rest. Left ventricular diastolic parameters are consistent with Grade I diastolic dysfunction (impaired relaxation).   2. Right ventricular systolic function is normal. The right ventricular  size is normal. Severely increased right ventricular wall thickness. There  is normal pulmonary artery systolic pressure. The estimated right  ventricular systolic pressure is 24.0 mmHg.   3. Left atrial size was severely dilated.   4. The mitral valve is grossly normal. No evidence of mitral valve  regurgitation.   5. The aortic valve is tricuspid. Aortic valve regurgitation is not  visualized.   6. The inferior vena cava is normal in size with greater than 50%  respiratory variability, suggesting right atrial pressure of 3 mmHg.   Comparison(s): Prior images reviewed side by side. LVEF similar to prior study per review.  cMRI 08/2021:  IMPRESSION: 1. 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 31 mm massive septal hypertrophy in the mid left  ventricular septum. Significant diffuse delayed myocardial enhancement appears >15% of myocardial mass. ECV 36%, nonspecific elevated. T2 is 59 msec in area of maximal wall thickness. Findings of >30 mm wall thickness and elevated T2 signal suggest  higher risk phenotype HCM.   2. Severely reduced LV systolic function by visual estimate, LVEF 30-35% with global hypokinesis.   2.  Mildly reduced RV function with normal RV size.   4. ECV value and inversion time myocardial nulling kinetics suggest against a diagnosis of cardiac amyloidosis.  Physical Exam:   VS:  BP (!) 141/72 (BP Location: Left Arm, Patient Position: Sitting, Cuff Size: Normal)   Pulse 74   Ht 6\' 1"  (1.854 m)   Wt 164 lb (74.4 kg)   SpO2 96%   BMI 21.64 kg/m    Wt Readings from Last 3 Encounters:  03/13/24 164 lb (74.4 kg)  12/15/23 151 lb 0.2 oz (68.5 kg)  11/27/23 151 lb 0.2 oz (68.5 kg)    GEN: Thin, 43 y.o. male in no acute distress NECK: No JVD; No carotid bruits CARDIAC: S1/S2, RRR, no murmurs, rubs, gallops RESPIRATORY:  Clear to auscultation without rales, wheezing or rhonchi  ABDOMEN: Soft, non-tender, non-distended EXTREMITIES:  No edema; No deformity   ASSESSMENT AND PLAN: .    Chronic combined CHF, HOCM Stage C, NYHA class I-II symptoms. EF 25-30% in April 2024 with findings of severe asymmetric LVH of septal segment, also involving rV, consistent with HOCM, no LVOT gradient noted to suggest obstruction, grade 1 DD. Euvolemic and well compensated on exam. Previous cMRI in 2022 revealed LVEF 30-35%, findings most consistent with HOCM without obstruction, 31 mm massive septal hypertrophy in mid left ventricular eptum. Findings of > 30 mm wall thickness and elevated T2 signal suggest higher risk phenotype of HCM, ECV value and inverison time myocardial nulling kinetics suggested against diagnosis of cardiac amyloidosis. Will confirm with attending cardiologist about updating cMRI. At next evaluation, will arrange holter monitor to evaluate for any arrhythmias and discuss genetic testing. Will obtain CMET, BNP. Will refill lasix . Continue current medication regimen. Plan to adjust GDMT over time. Low sodium diet, fluid restriction <2L, and daily weights  encouraged. Educated to contact our office for weight gain of 2 lbs overnight or 5 lbs in one week.  HTN BP elevated and not at goal. Discussed to monitor BP at home at least 2 hours after medications and sitting for 5-10 minutes. Continue current medication regimen. He will monitor and bring BP log to next office visit. If no improvement by next office visit, plan to start ARB/ACE. Heart healthy diet and regular cardiovascular exercise encouraged.   Polysubstance abuse Given information of local department to help with substance abuse. He verbalized understanding. Discussed risks regarding use. He verbalized understanding.   4. Family hx of ASCVD Does have positive FH of ASCVD. Will obtain lipid profile in addition to labs as mentioned above. Heart healthy diet and regular cardiovascular exercise encouraged.    Dispo: Follow-up with MD/APP in 6-8 weeks or sooner if anything changes.   Signed, Lasalle Pointer, NP

## 2024-03-13 NOTE — Patient Instructions (Addendum)
 Medication Instructions:  Your physician recommends that you continue on your current medications as directed. Please refer to the Current Medication list given to you today.  Labwork: In 1-2 weeks at [facility]   Testing/Procedures: None   Follow-Up: Your physician recommends that you schedule a follow-up appointment in: 6-8 weeks   Any Other Special Instructions Will Be Listed Below (If Applicable). HEART FAILURE INSTRUCTION SHEET  Follow a low-salt diet-you are allowed no more than 2,000 mg of sodium per day. Watch your fluid intake. In general, you should not be taking more than 64 ounces a day (no more than 8 glasses per day). Sometimes we refer to this as "2 liters per day." This includes sources of water in food like soup, coffee, tea, milk etc. Weigh yourself on the same scale at the same time of the day preferably immediately after your first void. Keep a log of your weights. Call your doctor: (Anytime you feel any of the following symptoms)  3 lbs weight gain overnight or 5 lbs within a week Shortness of breath, with or without a day hacking cough Swelling in hands, feet or stomach If you have to sleep on extra pillows at night in order to breathe   IT IS IMPORTANT TO LET YOUR DOCTOR KNOW EARLY ON IF YOU ARE HAVING SYMPTOMS SO WE CAN HELP YOU!    If you need a refill on your cardiac medications before your next appointment, please call your pharmacy.

## 2024-03-28 ENCOUNTER — Other Ambulatory Visit: Payer: Self-pay | Admitting: Family Medicine

## 2024-04-25 ENCOUNTER — Ambulatory Visit: Admitting: Nurse Practitioner

## 2024-04-25 NOTE — Progress Notes (Deleted)
 Cardiology Office Note:  .   Date:  03/13/2024 ID:  Richard Cox, DOB 07/21/1981, MRN [id number] PCP: Rosanna Comment, FNP  [facility] Providers Cardiologist:  Teddie Favre, MD    History of Present Illness: .   Richard Cox is a 43 y.o. male with a PMH of CHF, HTN, lymphoma, anxiety, HIV, polysubstance abuse, who presents today for overdue TOC follow-up.   Hospitalized for respiratory failure d/t RSV 11/2023.   Hospitalized 12/15/2023 ED in Feb 2025 d/t shortness of breath. Forgot to take Lasix . On arrival to ED, was hypertensive, tachycardic, and tachypneic. UDS positive for cocaine and marijuana. BNP elevated > 4,000. Trops elevated. Respiratory viral panel negative. CT angio chest neg for PE, evidence of multifocal PNA, CM with severe LVH, see full report below. Admitted and treated for acute hypoxemic respiratory failure secondary to combined CHF exacerbation in setting of hypertensive crisis related to medication noncompliance and cocaine use. Was weaned of nitroglycerin  drip and O2, was requesting d/c. Was requested that a repeat 2D echo was required and BP needed to be stabilized. Left AMA.   Today he presents for overdue hospital follow-up. Pt was no show several times for office evaluation prior to arriving today. Doing well. Denies any chest pain, shortness of breath, palpitations, syncope, presyncope, dizziness, orthopnea, PND, swelling or significant weight changes, acute bleeding, or claudication. Admits to shortness of breath when he does not take his Lasix . Currently he has doubled his Lasix  dosage because he did not get relief of symptoms from previous dosage. Currently taking Lasix  80 mg BID. He is requesting a refill of Lasix  today. Does not check BP at home.    SH: Admits to smoking cigars about twice per week. Denies any alcohol use. Admits to very infrequent illicit drug use, including cocaine and marijuana.   FH: Both parents are living. Mother had a stroke 3  weeks ago, getting better, also past hx of MI. Oldest brother passed away 2 years ago at age 77 years old, stated he had heart disease and CHF, known hx of CABG and 2 previous MI's.  ROS: Negative. See HPI.   Studies Reviewed: Aaron Aas    EKG is not ordered today.   Echo 02/2023:  1. Left ventricular ejection fraction, by estimation, is 25 to 30%. The  left ventricle has severely decreased function. The left ventricle  demonstrates global hypokinesis. There is severe asymmetric left  ventricular hypertrophy of the septal segment,  also involving RV. Consistent with hypertrophic cardiomyopathy, although no obvious LVOT gradient to suggest obstruction at rest. Left ventricular diastolic parameters are consistent with Grade I diastolic dysfunction (impaired relaxation).   2. Right ventricular systolic function is normal. The right ventricular  size is normal. Severely increased right ventricular wall thickness. There  is normal pulmonary artery systolic pressure. The estimated right  ventricular systolic pressure is 24.0 mmHg.   3. Left atrial size was severely dilated.   4. The mitral valve is grossly normal. No evidence of mitral valve  regurgitation.   5. The aortic valve is tricuspid. Aortic valve regurgitation is not  visualized.   6. The inferior vena cava is normal in size with greater than 50%  respiratory variability, suggesting right atrial pressure of 3 mmHg.   Comparison(s): Prior images reviewed side by side. LVEF similar to prior study per review.  cMRI 08/2021:  IMPRESSION: 1. 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 31 mm massive septal hypertrophy in the mid left  ventricular septum. Significant diffuse delayed myocardial enhancement appears >15% of myocardial mass. ECV 36%, nonspecific elevated. T2 is 59 msec in area of maximal wall thickness. Findings of >30 mm wall thickness and elevated T2 signal suggest  higher risk phenotype HCM.   2. Severely reduced LV systolic function by visual estimate, LVEF 30-35% with global hypokinesis.   2.  Mildly reduced RV function with normal RV size.   4. ECV value and inversion time myocardial nulling kinetics suggest against a diagnosis of cardiac amyloidosis.  Physical Exam:   VS:  There were no vitals taken for this visit.   Wt Readings from Last 3 Encounters:  03/13/24 164 lb (74.4 kg)  12/15/23 151 lb 0.2 oz (68.5 kg)  11/27/23 151 lb 0.2 oz (68.5 kg)    GEN: Thin, 43 y.o. male in no acute distress NECK: No JVD; No carotid bruits CARDIAC: S1/S2, RRR, no murmurs, rubs, gallops RESPIRATORY:  Clear to auscultation without rales, wheezing or rhonchi  ABDOMEN: Soft, non-tender, non-distended EXTREMITIES:  No edema; No deformity   ASSESSMENT AND PLAN: .    Chronic combined CHF, HOCM Stage C, NYHA class I-II symptoms. EF 25-30% in April 2024 with findings of severe asymmetric LVH of septal segment, also involving rV, consistent with HOCM, no LVOT gradient noted to suggest obstruction, grade 1 DD. Euvolemic and well compensated on exam. Previous cMRI in 2022 revealed LVEF 30-35%, findings most consistent with HOCM without obstruction, 31 mm massive septal hypertrophy in mid left ventricular eptum. Findings of > 30 mm wall thickness and elevated T2 signal suggest higher risk phenotype of HCM, ECV value and inverison time myocardial nulling kinetics suggested against diagnosis of cardiac amyloidosis. Will confirm with attending cardiologist about updating cMRI. At next evaluation, will arrange holter monitor to evaluate for any arrhythmias and discuss genetic testing. Will obtain CMET, BNP. Will refill lasix . Continue current medication regimen. Plan to adjust GDMT over time. Low sodium diet, fluid restriction <2L, and daily weights encouraged. Educated to contact our office for weight gain of 2 lbs overnight or 5 lbs in one week.  HTN BP elevated and not at  goal. Discussed to monitor BP at home at least 2 hours after medications and sitting for 5-10 minutes. Continue current medication regimen. He will monitor and bring BP log to next office visit. If no improvement by next office visit, plan to start ARB/ACE. Heart healthy diet and regular cardiovascular exercise encouraged.   Polysubstance abuse Given information of local department to help with substance abuse. He verbalized understanding. Discussed risks regarding use. He verbalized understanding.   4. Family hx of ASCVD Does have positive FH of ASCVD. Will obtain lipid profile in addition to labs as mentioned above. Heart healthy diet and regular cardiovascular exercise encouraged.    Dispo: Follow-up with MD/APP in 6-8 weeks or sooner if anything changes.   Signed, Lasalle Pointer, NP

## 2024-05-22 ENCOUNTER — Other Ambulatory Visit: Payer: Self-pay | Admitting: Family Medicine

## 2024-05-23 ENCOUNTER — Other Ambulatory Visit: Payer: Self-pay | Admitting: Family Medicine

## 2024-05-23 DIAGNOSIS — I5042 Chronic combined systolic (congestive) and diastolic (congestive) heart failure: Secondary | ICD-10-CM

## 2024-05-23 NOTE — Telephone Encounter (Signed)
 Copied from CRM [id number]. Topic: Clinical - Medication Refill >> May 23, 2024  3:20 PM Carlatta H wrote: Medication: hydrALAZINE  (APRESOLINE ) 25 MG tablet furosemide  (LASIX ) 80 MG tablet  Has the patient contacted their pharmacy? No (Agent: If no, request that the patient contact the pharmacy for the refill. If patient does not wish to contact the pharmacy document the reason why and proceed with request.) (Agent: If yes, when and what did the pharmacy advise?)  This is the patient's preferred pharmacy:  [facility] [address], [place] - [address]  [place] [address] Phone: [phone] Fax: [phone]    Is this the correct pharmacy for this prescription? Yes If no, delete pharmacy and type the correct one.   Has the prescription been filled recently? No  Is the patient out of the medication? Yes  Has the patient been seen for an appointment in the last year OR does the patient have an upcoming appointment? No  Can we respond through MyChart? Yes  Agent: Please be advised that Rx refills may take up to 3 business days. We ask that you follow-up with your pharmacy.

## 2024-05-24 ENCOUNTER — Other Ambulatory Visit: Payer: Self-pay | Admitting: Family Medicine

## 2024-06-09 ENCOUNTER — Telehealth: Payer: Self-pay | Admitting: Cardiology

## 2024-06-09 ENCOUNTER — Telehealth: Payer: Self-pay

## 2024-06-09 ENCOUNTER — Other Ambulatory Visit: Payer: Self-pay

## 2024-06-09 NOTE — Telephone Encounter (Signed)
 Medication sent by cardiology

## 2024-06-09 NOTE — Telephone Encounter (Signed)
*  STAT* If patient is at the pharmacy, call can be transferred to refill team.   1. Which medications need to be refilled? (please list name of each medication and dose if known) hydrALAZINE  (APRESOLINE ) 25 MG tablet   2. Which pharmacy/location (including street and city if local pharmacy) is medication to be sent to?  [id number] - [place], [place] - [address]    3. Do they need a 30 day or 90 day supply? 90

## 2024-06-09 NOTE — Telephone Encounter (Signed)
 Copied from CRM #[id number]. Topic: Clinical - Prescription Issue >> Jun 09, 2024  9:50 AM Travis F wrote: Reason for CRM: Patient is calling in because he is trying to get his medication refilled hydrALAZINE  (APRESOLINE ) 25 MG tablet [[id number]] and he says the pharmacy told him his provider denied his refill request. He says he was told by his provider it needed to come from his cardiology office, but patient says he has been unable to reach them and he has been out for two weeks. He wants to know if there is any way his provider can refill this medication for him. Please advise. Patient would like a call back.

## 2024-06-12 MED ORDER — HYDRALAZINE HCL 25 MG PO TABS: 25.0000 mg | ORAL_TABLET | Freq: Three times a day (TID) | ORAL | 2 refills | Status: DC

## 2024-06-13 ENCOUNTER — Other Ambulatory Visit: Payer: Self-pay

## 2024-06-13 ENCOUNTER — Other Ambulatory Visit: Payer: Self-pay | Admitting: Family

## 2024-06-13 DIAGNOSIS — B20 Human immunodeficiency virus [HIV] disease: Secondary | ICD-10-CM

## 2024-06-13 MED ORDER — BIKTARVY 50-200-25 MG PO TABS: 1.0000 | ORAL_TABLET | Freq: Every day | ORAL | 0 refills | Status: DC

## 2024-06-16 ENCOUNTER — Ambulatory Visit: Admitting: Nurse Practitioner

## 2024-07-07 ENCOUNTER — Other Ambulatory Visit: Payer: Self-pay

## 2024-07-07 ENCOUNTER — Encounter: Payer: Self-pay | Admitting: Family

## 2024-07-07 ENCOUNTER — Ambulatory Visit (INDEPENDENT_AMBULATORY_CARE_PROVIDER_SITE_OTHER): Admitting: Family

## 2024-07-07 VITALS — BP 152/100 | HR 87 | Temp 97.5°F | Ht 69.0 in | Wt 161.0 lb

## 2024-07-07 DIAGNOSIS — Z0000 Encounter for general adult medical examination without abnormal findings: Secondary | ICD-10-CM

## 2024-07-07 DIAGNOSIS — B349 Viral infection, unspecified: Secondary | ICD-10-CM | POA: Insufficient documentation

## 2024-07-07 DIAGNOSIS — Z79899 Other long term (current) drug therapy: Secondary | ICD-10-CM

## 2024-07-07 DIAGNOSIS — B20 Human immunodeficiency virus [HIV] disease: Secondary | ICD-10-CM | POA: Diagnosis not present

## 2024-07-07 MED ORDER — BIKTARVY 50-200-25 MG PO TABS
1.0000 | ORAL_TABLET | Freq: Every day | ORAL | 5 refills | Status: AC
Start: 2024-07-07 — End: ?

## 2024-07-07 NOTE — Assessment & Plan Note (Signed)
 Richard Cox appears to have symptoms that are consistent with resolving viral respiratory infection. No antibiotics indicated at this time. Continue OTC medications as needed for symptom relief and supportive care. Advised to follow up for worsening.

## 2024-07-07 NOTE — Assessment & Plan Note (Signed)
 [name] likely has well-controlled virus with good adherence and tolerance to Biktarvy .  Reviewed previous lab work and discussed plan of care and U equals U.  Discussed importance of routine follow-up and monitoring of viral load every 6 months.  No problems obtaining medication from the pharmacy and covered by Medicare.  Social determinants of health reviewed with no interventions indicated.  Check blood work.  Continue current dose of Biktarvy .  Plan for follow-up in 6 months or sooner if needed with lab work on the same day.

## 2024-07-07 NOTE — Assessment & Plan Note (Signed)
 Discussed importance of safe sexual practice and condom use. Condoms and site specific STD testing offered.  Vaccinations reviewed and deferred following counseling. New referral placed to [facility] for routine dental care. Due for anal pap at next office visit.

## 2024-07-07 NOTE — Patient Instructions (Addendum)
 Nice to see you.  We will check your lab work today.  Please call [facility]) to schedule/follow up on your dental care at [phone]  Continue to take your medication daily as prescribed.  Refills have been sent to the pharmacy.  Plan for follow up in 6 months or sooner if needed with lab work on the same day.  Have a great day and stay safe!  [facility] (24/7) Phone: [phone] Address: [address] [place], [place] [address]   [facility] [phone] Crisis Line [phone]

## 2024-07-07 NOTE — Progress Notes (Signed)
 Brief Narrative   Patient ID: Richard Cox, male    DOB: 05/07/1981, 43 y.o.   MRN: [id number]  Richard Cox is a 43 y/o  AA male diagnosed with HIV in May of 2020 with risk factor for acquiring HIV including MSM. No history of opportunistic infection or acute retroviral syndrome. Initial blood work for entry to care with CD4 of 188 and viral load of 65,200 (CDC Stage 3). Genotype was wild with no significant resistance. Sole medication regimen of Biktarvy    Subjective:   Chief Complaint  Patient presents with   Follow-up    B20     HPI:  Richard Cox is a 43 y.o. male with HIV disease last seen on 05/19/2023 with adequately controlled virus and good adherence and tolerance to Biktarvy .  Viral load was undetectable with CD4 count 580.  Kidney function and electrolytes within normal ranges.  Here today for follow-up.  Richard Cox has been taking his Biktarvy  as prescribed with no adverse side effects or problems obtaining medication from the pharmacy.  Covered by Medicare.  Currently on disability.  Housing, transportation, and access to food are stable.  Has been having some issues with persistent cough and chest congestion following onset of symptoms approximately 1 to 2 weeks ago with fever that has since resolved.  Symptoms have been refractory to Tylenol .  Overall course is improving with less congestion but is continuing to cough up phlegm and has decreased hearing out of his right ear.  Also with some maxillary sinus pressure.  Healthcare maintenance reviewed.  Not currently sexually active.  Denies fevers, chills, night sweats, headaches, changes in vision, neck pain/stiffness, nausea, diarrhea, vomiting, lesions or rashes.  Lab Results  Component Value Date   CD4TCELL 32 (L) 05/19/2023   CD4TABS 580 05/19/2023   Lab Results  Component Value Date   HIV1RNAQUANT 52 (H) 05/19/2023     Allergies  Allergen Reactions   Motrin  [Ibuprofen ] Hypertension   Neosporin [Neomycin-Bacitracin  Zn-Polymyx] Rash      Outpatient Medications Prior to Visit  Medication Sig Dispense Refill   acetaminophen  (TYLENOL ) 500 MG tablet Take 1,000 mg by mouth every 6 (six) hours as needed for moderate pain (pain score 4-6).     albuterol  (VENTOLIN  HFA) 108 (90 Base) MCG/ACT inhaler Inhale 2 puffs into the lungs every 6 (six) hours as needed for wheezing or shortness of breath. 8 g 2   furosemide  (LASIX ) 80 MG tablet Take 1 tablet (80 mg total) by mouth 2 (two) times daily. (MUST KEEP UPCOMING VISIT IN MAY, LAST SEEN 2020) 180 tablet 2   hydrALAZINE  (APRESOLINE ) 25 MG tablet Take 1 tablet (25 mg total) by mouth 3 (three) times daily. 270 tablet 2   bictegravir-emtricitabine -tenofovir  AF (BIKTARVY ) 50-200-25 MG TABS tablet Take 1 tablet by mouth daily. 30 tablet 0   Facility-Administered Medications Prior to Visit  Medication Dose Route Frequency Provider Last Rate Last Admin   sodium chloride  flush (NS) 0.9 % injection 10 mL  10 mL Intravenous PRN Rogers Hai, MD   10 mL at 10/15/20 1035   sodium chloride  flush (NS) 0.9 % injection 10 mL  10 mL Intravenous PRN Rogers Hai, MD   10 mL at 11/07/20 1046     Past Medical History:  Diagnosis Date   Acute respiratory failure with hypoxia (HCC) 08/24/2021   Anxiety    Chronic HFrEF (heart failure with reduced ejection fraction) (HCC)    CKD (chronic kidney disease) stage 2, GFR 60-89 ml/min  Essential hypertension    HIV infection (HCC)    Hypertrophic cardiomegaly    Lymphoma (HCC) 08/28/2020   Noncompliance with medication regimen    Polysubstance abuse (HCC)    Secondary cardiomyopathy (HCC)      Past Surgical History:  Procedure Laterality Date   IR IMAGING GUIDED PORT INSERTION  08/23/2020   Right   NO PAST SURGERIES          Review of Systems  Constitutional:  Negative for appetite change, chills, fatigue, fever and unexpected weight change.  HENT:  Positive for congestion and sinus pressure.   Eyes:   Negative for visual disturbance.  Respiratory:  Positive for cough. Negative for chest tightness, shortness of breath and wheezing.   Cardiovascular:  Negative for chest pain and leg swelling.  Gastrointestinal:  Negative for abdominal pain, constipation, diarrhea, nausea and vomiting.  Genitourinary:  Negative for dysuria, flank pain, frequency, genital sores, hematuria and urgency.  Skin:  Negative for rash.  Allergic/Immunologic: Negative for immunocompromised state.  Neurological:  Negative for dizziness and headaches.     Objective:   BP (!) 152/100   Pulse 87   Temp (!) 97.5 F (36.4 C) (Temporal)   Ht 5' 9 (1.753 m)   Wt 161 lb (73 kg)   SpO2 97%   BMI 23.78 kg/m  Nursing note and vital signs reviewed.  Physical Exam Constitutional:      General: He is not in acute distress.    Appearance: He is well-developed.     Comments: Seated in the chair; pleasant; unkempt   Cardiovascular:     Rate and Rhythm: Normal rate and regular rhythm.     Heart sounds: Normal heart sounds.  Pulmonary:     Effort: Pulmonary effort is normal.     Breath sounds: Normal breath sounds.  Skin:    General: Skin is warm and dry.  Neurological:     Mental Status: He is alert and oriented to person, place, and time.  Psychiatric:        Mood and Affect: Mood normal.          07/07/2024   11:24 AM 11/01/2023    9:10 AM 09/01/2023    2:53 PM 09/01/2023    2:50 PM 05/19/2023    1:43 PM  Depression screen PHQ 2/9  Decreased Interest 1 1 2   0  Down, Depressed, Hopeless 1 1 1   0  PHQ - 2 Score 2 2 3   0  Altered sleeping 3 3     Tired, decreased energy 2 1 3     Change in appetite 2 0 2    Feeling bad or failure about yourself  1 1 1 1    Trouble concentrating 0 1 2 2    Moving slowly or fidgety/restless 0 0 1 1   Suicidal thoughts 0 0 0 0   PHQ-9 Score 10 8     Difficult doing work/chores Somewhat difficult Not difficult at all Somewhat difficult          07/07/2024   11:24 AM  11/01/2023    9:10 AM 09/01/2023    2:53 PM 09/01/2023    2:52 PM  GAD 7 : Generalized Anxiety Score  Nervous, Anxious, on Edge 1 0 1 1  Control/stop worrying 1 0 1 1  Worry too much - different things 1 0 2 2  Trouble relaxing 1 2 3 3   Restless 0 0 2 2  Easily annoyed or irritable 1 2 3  3  Afraid - awful might happen 0 1 2 2   Total GAD 7 Score 5 5 14 14   Anxiety Difficulty Somewhat difficult Not difficult at all Somewhat difficult Somewhat difficult     The ASCVD Risk score (Arnett DK, et al., 2019) failed to calculate for the following reasons:   Risk score cannot be calculated because patient has a medical history suggesting prior/existing ASCVD      Assessment & Plan:    Patient Active Problem List   Diagnosis Date Noted   Viral infection 07/07/2024   Bronchiolitis due to respiratory syncytial virus (RSV) 11/28/2023   RSV infection 11/27/2023   Otitis media 11/01/2023   Left hip pain 09/01/2023   Anxiety 09/01/2023   On mechanically assisted ventilation (HCC) 08/29/2023   Acute pulmonary edema (HCC) 08/29/2023   Respiratory failure (HCC) 08/28/2023   Hypoxia 05/21/2023   Acute respiratory failure (HCC) 05/21/2023   Poor social situation 05/19/2023   CHF (congestive heart failure) (HCC) 09/04/2022   Cocaine abuse (HCC) 09/04/2022   Hypertensive crisis 08/13/2022   Healthcare maintenance 10/17/2021   Acute respiratory failure with hypoxia (HCC) 08/24/2021   Elevated troponin 08/24/2021   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   Intra-abdominal lymphadenopathy 07/30/2020   Generalized lymphadenopathy 07/25/2020   Varicella zoster 01/04/2020   AIDS (acquired immune deficiency syndrome) (HCC) 10/10/2019   Acute on chronic combined systolic and diastolic CHF (congestive heart failure) (HCC) 09/18/2019   HIV (human immunodeficiency virus infection) (HCC) 09/18/2019   Substance abuse (HCC) 09/18/2019   Alcohol abuse 09/18/2019   Tobacco abuse 09/18/2019   HTN  (hypertension) 03/10/2019   Noncompliance with medication regimen 03/10/2019     Problem List Items Addressed This Visit       Other   AIDS (acquired immune deficiency syndrome) [facility])   Richard Cox likely has well-controlled virus with good adherence and tolerance to Biktarvy .  Reviewed previous lab work and discussed plan of care and U equals U.  Discussed importance of routine follow-up and monitoring of viral load every 6 months.  No problems obtaining medication from the pharmacy and covered by Medicare.  Social determinants of health reviewed with no interventions indicated.  Check blood work.  Continue current dose of Biktarvy .  Plan for follow-up in 6 months or sooner if needed with lab work on the same day.      Relevant Medications   bictegravir-emtricitabine -tenofovir  AF (BIKTARVY ) 50-200-25 MG TABS tablet   Other Relevant Orders   Comprehensive metabolic panel with GFR   HIV-1 RNA quant-no reflex-bld   T-helper cells (CD4) count (not at [facility])   Healthcare maintenance   Discussed importance of safe sexual practice and condom use. Condoms and site specific STD testing offered.  Vaccinations reviewed and deferred following counseling. [facility] referral placed to [facility] for routine dental care. Due for anal pap at next office visit.       Viral infection   Richard Cox appears to have symptoms that are consistent with resolving viral respiratory infection. No antibiotics indicated at this time. Continue OTC medications as needed for symptom relief and supportive care. Advised to follow up for worsening.       Relevant Medications   bictegravir-emtricitabine -tenofovir  AF (BIKTARVY ) 50-200-25 MG TABS tablet   Other Visit Diagnoses       Pharmacologic therapy    -  Primary   Relevant Orders   Lipid panel        I am having Richard Cox maintain his albuterol , acetaminophen , furosemide , hydrALAZINE , and  Biktarvy .   Meds ordered this encounter  Medications    bictegravir-emtricitabine -tenofovir  AF (BIKTARVY ) 50-200-25 MG TABS tablet    Sig: Take 1 tablet by mouth daily.    Dispense:  30 tablet    Refill:  5    Supervising Provider:   SNIDER, CYNTHIA [phone]    Prescription Type::   Renewal     Follow-up: Return in about 6 months (around 01/06/2025). or sooner if needed.    Cathlyn July, MSN, FNP-C Nurse Practitioner [facility] Main number: [phone]

## 2024-07-08 ENCOUNTER — Telehealth: Payer: Self-pay | Admitting: Infectious Disease

## 2024-07-08 NOTE — Telephone Encounter (Signed)
 Critically low BG on labs, called last night at 134AM and left VM and again today at 810 AM, instructed to call me back if needed. I am wondering if lab error or had not eaten at all

## 2024-07-10 LAB — COMPREHENSIVE METABOLIC PANEL WITH GFR
AG Ratio: 1.1 (calc) (ref 1.0–2.5)
ALT: 17 U/L (ref 9–46)
AST: 22 U/L (ref 10–40)
Albumin: 4 g/dL (ref 3.6–5.1)
Alkaline phosphatase (APISO): 91 U/L (ref 36–130)
BUN/Creatinine Ratio: 23 (calc) — ABNORMAL HIGH (ref 6–22)
BUN: 32 mg/dL — ABNORMAL HIGH (ref 7–25)
CO2: 27 mmol/L (ref 20–32)
Calcium: 9.3 mg/dL (ref 8.6–10.3)
Chloride: 99 mmol/L (ref 98–110)
Creat: 1.4 mg/dL — ABNORMAL HIGH (ref 0.60–1.29)
Globulin: 3.6 g/dL (ref 1.9–3.7)
Glucose, Bld: 34 mg/dL — CL (ref 65–99)
Potassium: 3.9 mmol/L (ref 3.5–5.3)
Sodium: 140 mmol/L (ref 135–146)
Total Bilirubin: 0.5 mg/dL (ref 0.2–1.2)
Total Protein: 7.6 g/dL (ref 6.1–8.1)
eGFR: 65 mL/min/1.73m2 (ref >=60)

## 2024-07-10 LAB — T-HELPER CELLS (CD4) COUNT (NOT AT ARMC)
Absolute CD4: 668 {cells}/uL (ref 490–1740)
CD4 T Helper %: 38 % (ref 30–61)
Total lymphocyte count: 1777 {cells}/uL (ref 850–3900)

## 2024-07-10 LAB — HIV-1 RNA QUANT-NO REFLEX-BLD
HIV 1 RNA Quant: 66 {copies}/mL — ABNORMAL HIGH
HIV-1 RNA Quant, Log: 1.82 {Log_copies}/mL — ABNORMAL HIGH

## 2024-07-11 NOTE — Telephone Encounter (Signed)
 Attempted to contact patient regarding BG level. Not able to reach him at this time. Not able to leave VM.  Lorenda CHRISTELLA Code, RMA

## 2024-07-12 ENCOUNTER — Emergency Department (HOSPITAL_COMMUNITY)

## 2024-07-12 ENCOUNTER — Emergency Department (HOSPITAL_COMMUNITY)
Admission: EM | Admit: 2024-07-12 | Discharge: 2024-07-12 | Disposition: A | Discharge status: Home / Self Care | Attending: Emergency Medicine | Admitting: Emergency Medicine

## 2024-07-12 ENCOUNTER — Encounter (HOSPITAL_COMMUNITY): Payer: Self-pay | Admitting: Emergency Medicine

## 2024-07-12 ENCOUNTER — Other Ambulatory Visit: Payer: Self-pay

## 2024-07-12 DIAGNOSIS — S32009A Unspecified fracture of unspecified lumbar vertebra, initial encounter for closed fracture: Secondary | ICD-10-CM

## 2024-07-12 DIAGNOSIS — E876 Hypokalemia: Secondary | ICD-10-CM | POA: Diagnosis not present

## 2024-07-12 DIAGNOSIS — I509 Heart failure, unspecified: Secondary | ICD-10-CM | POA: Diagnosis not present

## 2024-07-12 DIAGNOSIS — X58XXXA Exposure to other specified factors, initial encounter: Secondary | ICD-10-CM | POA: Insufficient documentation

## 2024-07-12 DIAGNOSIS — Z21 Asymptomatic human immunodeficiency virus [HIV] infection status: Secondary | ICD-10-CM | POA: Diagnosis not present

## 2024-07-12 DIAGNOSIS — S32029A Unspecified fracture of second lumbar vertebra, initial encounter for closed fracture: Secondary | ICD-10-CM | POA: Diagnosis not present

## 2024-07-12 DIAGNOSIS — R1012 Left upper quadrant pain: Secondary | ICD-10-CM | POA: Insufficient documentation

## 2024-07-12 DIAGNOSIS — Z79899 Other long term (current) drug therapy: Secondary | ICD-10-CM | POA: Insufficient documentation

## 2024-07-12 DIAGNOSIS — S32039A Unspecified fracture of third lumbar vertebra, initial encounter for closed fracture: Secondary | ICD-10-CM | POA: Insufficient documentation

## 2024-07-12 DIAGNOSIS — S3992XA Unspecified injury of lower back, initial encounter: Secondary | ICD-10-CM | POA: Diagnosis present

## 2024-07-12 DIAGNOSIS — I1A0 Resistant hypertension: Secondary | ICD-10-CM | POA: Insufficient documentation

## 2024-07-12 DIAGNOSIS — I110 Hypertensive heart disease with heart failure: Secondary | ICD-10-CM | POA: Diagnosis not present

## 2024-07-12 DIAGNOSIS — I10 Essential (primary) hypertension: Secondary | ICD-10-CM

## 2024-07-12 LAB — COMPREHENSIVE METABOLIC PANEL WITH GFR
ALT: 18 U/L (ref 0–44)
AST: 21 U/L (ref 15–41)
Albumin: 3.6 g/dL (ref 3.5–5.0)
Alkaline Phosphatase: 94 U/L (ref 38–126)
Anion gap: 18 — ABNORMAL HIGH (ref 5–15)
BUN: 22 mg/dL — ABNORMAL HIGH (ref 6–20)
CO2: 21 mmol/L — ABNORMAL LOW (ref 22–32)
Calcium: 9.2 mg/dL (ref 8.9–10.3)
Chloride: 99 mmol/L (ref 98–111)
Creatinine, Ser: 1.4 mg/dL — ABNORMAL HIGH (ref 0.61–1.24)
GFR, Estimated: >60 mL/min (ref >60)
Glucose, Bld: 106 mg/dL — ABNORMAL HIGH (ref 70–99)
Potassium: 3.3 mmol/L — ABNORMAL LOW (ref 3.5–5.1)
Sodium: 138 mmol/L (ref 135–145)
Total Bilirubin: 1.1 mg/dL (ref 0.0–1.2)
Total Protein: 8.5 g/dL — ABNORMAL HIGH (ref 6.5–8.1)

## 2024-07-12 LAB — URINALYSIS, ROUTINE W REFLEX MICROSCOPIC
Bacteria, UA: NONE SEEN
Bilirubin Urine: NEGATIVE
Glucose, UA: NEGATIVE mg/dL
Hgb urine dipstick: NEGATIVE
Ketones, ur: NEGATIVE mg/dL
Leukocytes,Ua: NEGATIVE
Nitrite: NEGATIVE
Protein, ur: 30 mg/dL — AB
Specific Gravity, Urine: 1.034 — ABNORMAL HIGH (ref 1.005–1.030)
pH: 5 (ref 5.0–8.0)

## 2024-07-12 LAB — CBC WITH DIFFERENTIAL/PLATELET
Abs Immature Granulocytes: 0.02 K/uL (ref 0.00–0.07)
Basophils Absolute: 0 K/uL (ref 0.0–0.1)
Basophils Relative: 0 %
Eosinophils Absolute: 0 K/uL (ref 0.0–0.5)
Eosinophils Relative: 0 %
HCT: 37.1 % — ABNORMAL LOW (ref 39.0–52.0)
Hemoglobin: 12.5 g/dL — ABNORMAL LOW (ref 13.0–17.0)
Immature Granulocytes: 0 %
Lymphocytes Relative: 15 %
Lymphs Abs: 1.2 K/uL (ref 0.7–4.0)
MCH: 29.8 pg (ref 26.0–34.0)
MCHC: 33.7 g/dL (ref 30.0–36.0)
MCV: 88.5 fL (ref 80.0–100.0)
Monocytes Absolute: 0.3 K/uL (ref 0.1–1.0)
Monocytes Relative: 4 %
Neutro Abs: 6.2 K/uL (ref 1.7–7.7)
Neutrophils Relative %: 81 %
Platelets: UNDETERMINED K/uL (ref 150–400)
RBC: 4.19 MIL/uL — ABNORMAL LOW (ref 4.22–5.81)
RDW: 15.5 % (ref 11.5–15.5)
WBC: 7.7 K/uL (ref 4.0–10.5)
nRBC: 0 % (ref 0.0–0.2)

## 2024-07-12 LAB — LIPASE, BLOOD: Lipase: 37 U/L (ref 11–51)

## 2024-07-12 LAB — TROPONIN I (HIGH SENSITIVITY)
Troponin I (High Sensitivity): 186 ng/L (ref <18)
Troponin I (High Sensitivity): 166 ng/L (ref <18)

## 2024-07-12 MED ORDER — ONDANSETRON HCL 4 MG/2ML IJ SOLN
4.0000 mg | Freq: Once | INTRAMUSCULAR | Status: AC
  Administered 2024-07-12: 4 mg via INTRAVENOUS
  Filled 2024-07-12: qty 2

## 2024-07-12 MED ORDER — IOHEXOL 300 MG/ML  SOLN
100.0000 mL | Freq: Once | INTRAMUSCULAR | Status: AC | PRN
  Administered 2024-07-12: 100 mL via INTRAVENOUS

## 2024-07-12 MED ORDER — POTASSIUM CHLORIDE CRYS ER 20 MEQ PO TBCR
40.0000 meq | EXTENDED_RELEASE_TABLET | Freq: Once | ORAL | Status: AC
  Administered 2024-07-12: 40 meq via ORAL
  Filled 2024-07-12: qty 2

## 2024-07-12 MED ORDER — HYDRALAZINE HCL 20 MG/ML IJ SOLN
10.0000 mg | Freq: Once | INTRAMUSCULAR | Status: AC
  Administered 2024-07-12: 10 mg via INTRAVENOUS
  Filled 2024-07-12: qty 1

## 2024-07-12 MED ORDER — LABETALOL HCL 5 MG/ML IV SOLN
20.0000 mg | Freq: Once | INTRAVENOUS | Status: AC
  Administered 2024-07-12: 20 mg via INTRAVENOUS
  Filled 2024-07-12: qty 4

## 2024-07-12 MED ORDER — ALUM & MAG HYDROXIDE-SIMETH 200-200-20 MG/5ML PO SUSP
30.0000 mL | Freq: Once | ORAL | Status: AC
  Administered 2024-07-12: 30 mL via ORAL
  Filled 2024-07-12: qty 30

## 2024-07-12 MED ORDER — MORPHINE SULFATE (PF) 4 MG/ML IV SOLN
4.0000 mg | Freq: Once | INTRAVENOUS | Status: AC
  Administered 2024-07-12: 4 mg via INTRAVENOUS
  Filled 2024-07-12: qty 1

## 2024-07-12 NOTE — ED Provider Notes (Signed)
 I was given an ecg for this patient at 4 pm shortly after start of shift.  He had presented for abdominal discomfort and vomiting and was being managed by PA and early provider.  His ECGmy interpretation showed a sinus rhythm with concerning ST elevations in V3 and V4.  Upon my read further review of his prior EKGs, he has a history of left ventricular hypertrophy and cardiomyopathy noted on outpatient MRI and consistent ST elevations in these leads.  T wave inversions might be dynamic.  I reassessed the patient immediately and he reports that he never had any chest pain or pressure, only lower abdominal discomfort and nausea.  He is currently asymptomatic and tolerating food fine.  I have a lower suspicion at this is a STEMI clinically and advised troponin levels added on.   Cottie Donnice PARAS, MD 07/12/24 [phone]
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
## 2024-07-12 NOTE — ED Triage Notes (Signed)
 Pt c/o of left upper abdominal pain starting last night.  Pt denies any falls, n/v/d

## 2024-07-12 NOTE — ED Provider Notes (Signed)
 Patient's delta troponins were chronically mildly elevated but no significant change from baselines.  This all consistent with his known cardiomyopathy and LVH.  Repeat EKG appears stable.  He continues to complain of no chest pain or coronary pain mimickers, including shortness of breath.  He feels much better.  He is noted to have persistent longstanding poorly treated hypertension, which was discussed with them.  He needs outpatient follow-up for this issue.  But he is stable otherwise for discharge   Richard Donnice PARAS, MD 07/12/24 1950

## 2024-07-12 NOTE — Discharge Instructions (Signed)
 Your CT scan and lab tests are reassuring today as there is no abnormalities to explain your abdominal pain and since the pain has resolved we feel it is safe for you to be discharged home at this time.  However as discussed your blood pressure remains significantly elevated and we do strongly recommend close follow-up with your cardiologist for an office visit to further discuss blood pressure management.  Make sure you are not missing any of your home medications.  As discussed you do have fractures of lateral transverse fractures in your lower back which appear to be several months old based on your history of injury at that time.  There is no further action required regarding this especially since your pain has resolved.  Your potassium was a little bit low today and you were given a tablet to help replace this.  Plan close follow-up with your primary provider.

## 2024-07-14 ENCOUNTER — Ambulatory Visit: Payer: Self-pay | Admitting: Family Medicine

## 2024-07-19 ENCOUNTER — Ambulatory Visit: Payer: Self-pay | Admitting: Family

## 2024-08-02 ENCOUNTER — Emergency Department (HOSPITAL_COMMUNITY)
Admission: EM | Admit: 2024-08-02 | Discharge: 2024-08-02 | Disposition: A | Discharge status: Home / Self Care | Attending: Emergency Medicine | Admitting: Emergency Medicine

## 2024-08-02 ENCOUNTER — Emergency Department (HOSPITAL_COMMUNITY)

## 2024-08-02 ENCOUNTER — Other Ambulatory Visit: Payer: Self-pay

## 2024-08-02 DIAGNOSIS — R0602 Shortness of breath: Secondary | ICD-10-CM | POA: Insufficient documentation

## 2024-08-02 DIAGNOSIS — I509 Heart failure, unspecified: Secondary | ICD-10-CM | POA: Insufficient documentation

## 2024-08-02 DIAGNOSIS — R0981 Nasal congestion: Secondary | ICD-10-CM | POA: Insufficient documentation

## 2024-08-02 DIAGNOSIS — R059 Cough, unspecified: Secondary | ICD-10-CM | POA: Insufficient documentation

## 2024-08-02 DIAGNOSIS — Z21 Asymptomatic human immunodeficiency virus [HIV] infection status: Secondary | ICD-10-CM | POA: Diagnosis not present

## 2024-08-02 DIAGNOSIS — R0789 Other chest pain: Secondary | ICD-10-CM | POA: Insufficient documentation

## 2024-08-02 DIAGNOSIS — R0989 Other specified symptoms and signs involving the circulatory and respiratory systems: Secondary | ICD-10-CM | POA: Insufficient documentation

## 2024-08-02 LAB — CBC WITH DIFFERENTIAL/PLATELET
Abs Immature Granulocytes: 0.01 K/uL (ref 0.00–0.07)
Basophils Absolute: 0 K/uL (ref 0.0–0.1)
Basophils Relative: 0 %
Eosinophils Absolute: 0 K/uL (ref 0.0–0.5)
Eosinophils Relative: 1 %
HCT: 38.2 % — ABNORMAL LOW (ref 39.0–52.0)
Hemoglobin: 12.6 g/dL — ABNORMAL LOW (ref 13.0–17.0)
Immature Granulocytes: 0 %
Lymphocytes Relative: 33 %
Lymphs Abs: 1.6 K/uL (ref 0.7–4.0)
MCH: 29.2 pg (ref 26.0–34.0)
MCHC: 33 g/dL (ref 30.0–36.0)
MCV: 88.4 fL (ref 80.0–100.0)
Monocytes Absolute: 0.3 K/uL (ref 0.1–1.0)
Monocytes Relative: 5 %
Neutro Abs: 3 K/uL (ref 1.7–7.7)
Neutrophils Relative %: 61 %
Platelets: 153 K/uL (ref 150–400)
RBC: 4.32 MIL/uL (ref 4.22–5.81)
RDW: 15.2 % (ref 11.5–15.5)
WBC: 4.9 K/uL (ref 4.0–10.5)
nRBC: 0 % (ref 0.0–0.2)

## 2024-08-02 LAB — BASIC METABOLIC PANEL WITH GFR
Anion gap: 16 — ABNORMAL HIGH (ref 5–15)
BUN: 24 mg/dL — ABNORMAL HIGH (ref 6–20)
CO2: 24 mmol/L (ref 22–32)
Calcium: 8.9 mg/dL (ref 8.9–10.3)
Chloride: 100 mmol/L (ref 98–111)
Creatinine, Ser: 1.49 mg/dL — ABNORMAL HIGH (ref 0.61–1.24)
GFR, Estimated: >60 mL/min (ref >60)
Glucose, Bld: 146 mg/dL — ABNORMAL HIGH (ref 70–99)
Potassium: 3.1 mmol/L — ABNORMAL LOW (ref 3.5–5.1)
Sodium: 140 mmol/L (ref 135–145)

## 2024-08-02 LAB — TROPONIN I (HIGH SENSITIVITY)
Troponin I (High Sensitivity): 179 ng/L (ref <18)
Troponin I (High Sensitivity): 157 ng/L (ref <18)

## 2024-08-02 LAB — BRAIN NATRIURETIC PEPTIDE: B Natriuretic Peptide: 2700 pg/mL — ABNORMAL HIGH (ref 0.0–100.0)

## 2024-08-02 LAB — RESP PANEL BY RT-PCR (RSV, FLU A&B, COVID)  RVPGX2
Influenza A by PCR: NEGATIVE
Influenza B by PCR: NEGATIVE
Resp Syncytial Virus by PCR: NEGATIVE
SARS Coronavirus 2 by RT PCR: NEGATIVE

## 2024-08-02 MED ORDER — AZITHROMYCIN 250 MG PO TABS: 250.0000 mg | ORAL_TABLET | Freq: Every day | ORAL | 0 refills | Status: AC

## 2024-08-02 MED ORDER — POTASSIUM CHLORIDE CRYS ER 20 MEQ PO TBCR
40.0000 meq | EXTENDED_RELEASE_TABLET | Freq: Once | ORAL | Status: AC
  Administered 2024-08-02: 40 meq via ORAL
  Filled 2024-08-02: qty 2

## 2024-08-02 MED ORDER — IPRATROPIUM-ALBUTEROL 0.5-2.5 (3) MG/3ML IN SOLN
3.0000 mL | Freq: Once | RESPIRATORY_TRACT | Status: AC
  Administered 2024-08-02: 3 mL via RESPIRATORY_TRACT
  Filled 2024-08-02: qty 3

## 2024-08-02 MED ORDER — FUROSEMIDE 10 MG/ML IJ SOLN
80.0000 mg | Freq: Once | INTRAMUSCULAR | Status: AC
  Administered 2024-08-02: 80 mg via INTRAVENOUS
  Filled 2024-08-02: qty 8

## 2024-08-02 NOTE — ED Triage Notes (Signed)
 Pt c/o having difficulty breathing, cough, shob, denies CP at this time, denies any pain at this time. Hx of CHF

## 2024-08-02 NOTE — ED Provider Notes (Signed)
 [place] EMERGENCY DEPARTMENT AT [facility] Provider Note   CSN: [id number] Arrival date & time: 08/02/24  9241     Patient presents with: Shortness of Breath   Richard Cox is a 43 y.o. male.  He has a history of HIV and congestive heart failure.  Complaining of a few days of chest congestion, cough productive of some white and clear sputum, chest pressure, nasal congestion.  He is not sure if he had a fever.  He is a smoker.  Has been taking extra doses of diuretic.  No nausea or vomiting.   The history is provided by the patient.  Shortness of Breath Severity:  Moderate Onset quality:  Gradual Duration:  4 days Timing:  Constant Progression:  Unchanged Chronicity:  Recurrent Relieved by:  Nothing Worsened by:  Activity and coughing Ineffective treatments:  Diuretics Associated symptoms: chest pain, cough and sputum production   Associated symptoms: no abdominal pain, no fever, no hemoptysis and no wheezing   Risk factors: tobacco use        Prior to Admission medications   Medication Sig Start Date End Date Taking? Authorizing Provider  acetaminophen  (TYLENOL ) 500 MG tablet Take 1,000 mg by mouth every 6 (six) hours as needed for moderate pain (pain score 4-6).    [provider]  albuterol  (VENTOLIN  HFA) 108 (90 Base) MCG/ACT inhaler Inhale 2 puffs into the lungs every 6 (six) hours as needed for wheezing or shortness of breath. 12/14/23   Del Wilhelmena Lloyd Sola, FNP  bictegravir-emtricitabine -tenofovir  AF (BIKTARVY ) 50-200-25 MG TABS tablet Take 1 tablet by mouth daily. 07/07/24   Calone, Gregory D, FNP  furosemide  (LASIX ) 80 MG tablet Take 1 tablet (80 mg total) by mouth 2 (two) times daily. (MUST KEEP UPCOMING VISIT IN MAY, LAST SEEN 2020) 03/13/24   Miriam Norris, NP  hydrALAZINE  (APRESOLINE ) 25 MG tablet Take 1 tablet (25 mg total) by mouth 3 (three) times daily. 06/12/24   Debera Jayson MATSU, MD    Allergies: Motrin  [ibuprofen ] and Neosporin  [neomycin-bacitracin zn-polymyx]    Review of Systems  Constitutional:  Negative for fever.  Respiratory:  Positive for cough, sputum production and shortness of breath. Negative for hemoptysis and wheezing.   Cardiovascular:  Positive for chest pain.  Gastrointestinal:  Negative for abdominal pain.    Updated Vital Signs BP (!) 172/113 (BP Location: Right Arm)   Pulse 82   Temp 98.2 F (36.8 C) (Oral)   Resp 11   SpO2 97%   Physical Exam Vitals and nursing note reviewed.  Constitutional:      Appearance: Normal appearance. He is well-developed.  HENT:     Head: Normocephalic and atraumatic.  Eyes:     Conjunctiva/sclera: Conjunctivae normal.  Cardiovascular:     Rate and Rhythm: Normal rate and regular rhythm.     Heart sounds: No murmur heard. Pulmonary:     Effort: Pulmonary effort is normal. No respiratory distress.     Breath sounds: Rhonchi present.  Abdominal:     Palpations: Abdomen is soft.     Tenderness: There is no abdominal tenderness.  Musculoskeletal:     Cervical back: Neck supple.     Right lower leg: No tenderness. No edema.     Left lower leg: No tenderness. No edema.  Skin:    General: Skin is warm and dry.  Neurological:     General: No focal deficit present.     Mental Status: He is alert.  GCS: GCS eye subscore is 4. GCS verbal subscore is 5. GCS motor subscore is 6.     (all labs ordered are listed, but only abnormal results are displayed) Labs Reviewed  BASIC METABOLIC PANEL WITH GFR - Abnormal; Notable for the following components:      Result Value   Potassium 3.1 (*)    Glucose, Bld 146 (*)    BUN 24 (*)    Creatinine, Ser 1.49 (*)    Anion gap 16 (*)    All other components within normal limits  BRAIN NATRIURETIC PEPTIDE - Abnormal; Notable for the following components:   B Natriuretic Peptide 2,700.0 (*)    All other components within normal limits  CBC WITH DIFFERENTIAL/PLATELET - Abnormal; Notable for the following  components:   Hemoglobin 12.6 (*)    HCT 38.2 (*)    All other components within normal limits  TROPONIN I (HIGH SENSITIVITY) - Abnormal; Notable for the following components:   Troponin I (High Sensitivity) 179 (*)    All other components within normal limits  TROPONIN I (HIGH SENSITIVITY) - Abnormal; Notable for the following components:   Troponin I (High Sensitivity) 157 (*)    All other components within normal limits  RESP PANEL BY RT-PCR (RSV, FLU A&B, COVID)  [id number]    EKG: EKG Interpretation Date/Time:  Wednesday August 02 2024 08:16:35 EDT Ventricular Rate:  83 PR Interval:  184 QRS Duration:  112 QT Interval:  456 QTC Calculation: 536 R Axis:   82  Text Interpretation: Sinus rhythm Biatrial enlargement Left ventricular hypertrophy Abnormal T, consider ischemia, diffuse leads ST elevation, consider anterolateral injury Prolonged QT interval No significant change since prior 9/25 Confirmed by Towana Sharper [phone]) on 08/02/2024 8:19:39 AM  Radiology: [facility] Chest Port 1 View Result Date: 08/02/2024 CLINICAL DATA:  Cough which shortness-of-breath. EXAM: PORTABLE CHEST 1 VIEW COMPARISON:  07/12/2024 FINDINGS: Right IJ Port-A-Cath unchanged. Lungs are adequately inflated without focal airspace consolidation or effusion. Interval resolution of mild vascular congestion. Cardiomediastinal silhouette and remainder of the exam is unchanged. IMPRESSION: No active disease. Electronically Signed   By: Toribio Agreste M.D.   On: 08/02/2024 09:20     Procedures   Medications Ordered in the ED  ipratropium-albuterol  (DUONEB) 0.5-2.5 (3) MG/3ML nebulizer solution 3 mL (3 mLs Nebulization Given 08/02/24 0854)  potassium chloride  SA (KLOR-CON  M) CR tablet 40 mEq (40 mEq Oral Given 08/02/24 1143)  furosemide  (LASIX ) injection 80 mg (80 mg Intravenous Given 08/02/24 1144)    Clinical Course as of 08/02/24 1632  Wed Aug 02, 2024  0855 Chest x-ray interpreted by me as cardiomegaly no gross  infiltrate.  Awaiting radiology reading. [MB]  1021 Patient's lab work showing elevated troponin, baseline for him.  BNP is elevated but he has been higher in the past.  Will give an IV dose of Lasix  and some oral potassium.  I reviewed this with him.  He would like treatment for bronchitis. [MB]    Clinical Course User Index [MB] Towana Sharper BROCKS, MD                                 Medical Decision Making Amount and/or Complexity of Data Reviewed Labs: ordered. Radiology: ordered.  Risk Prescription drug management.   This patient complains of head congestion chest congestion shortness of breath; this involves an extensive number of treatment Options and is a complaint that carries with it a  high risk of complications and morbidity. The differential includes pneumonia, COVID, flu, URI, CHF  I ordered, reviewed and interpreted labs, which included CBC with normal white count, stable low hemoglobin, chemistries with low potassium slightly elevated creatinine, troponins elevated but flat, BNP elevated, has been worse in the past, COVID and flu negative I ordered medication oral potassium, IV Lasix , DuoNeb and reviewed PMP when indicated. I ordered imaging studies which included chest x-ray and I independently    visualized and interpreted imaging which showed cardiomegaly no acute findings Previous records obtained and reviewed in epic including recent cardiology and ID notes Cardiac monitoring reviewed, sinus rhythm Social determinants considered, tobacco use, transportation and food insecurity Critical Interventions: None  After the interventions stated above, I reevaluated the patient and found patient to be satting well on room air in no respiratory distress Admission and further testing considered, he is comfortable plan for outpatient follow-up with his treatment team, will start on antibiotics for possible bronchitis.  He will continue his diuretics.  Return instructions  discussed      Final diagnoses:  Shortness of breath    ED Discharge Orders          Ordered    azithromycin  (ZITHROMAX ) 250 MG tablet  Daily        08/02/24 1125               Towana Ozell BROCKS, MD 08/02/24 [phone]

## 2024-08-02 NOTE — Discharge Instructions (Signed)
 You are seen in the emergency department for shortness of breath chest congestion and head congestion.  Your COVID and flu test were negative.  Your heart failure number was mildly elevated and you were given an IV dose of the furosemide .  We are starting you on some antibiotics for bronchitis.  Please continue your other medications and follow-up with your primary care doctor and cardiology teams.  Return if any worsening or concerning symptoms

## 2024-08-15 ENCOUNTER — Encounter: Payer: Self-pay | Admitting: Nurse Practitioner

## 2024-08-15 ENCOUNTER — Ambulatory Visit: Attending: Nurse Practitioner | Admitting: Nurse Practitioner

## 2024-08-15 VITALS — BP 140/81 | HR 83 | Ht 73.0 in | Wt 157.4 lb

## 2024-08-15 DIAGNOSIS — R7989 Other specified abnormal findings of blood chemistry: Secondary | ICD-10-CM | POA: Diagnosis present

## 2024-08-15 DIAGNOSIS — F1910 Other psychoactive substance abuse, uncomplicated: Secondary | ICD-10-CM | POA: Insufficient documentation

## 2024-08-15 DIAGNOSIS — I5042 Chronic combined systolic (congestive) and diastolic (congestive) heart failure: Secondary | ICD-10-CM | POA: Diagnosis not present

## 2024-08-15 DIAGNOSIS — I421 Obstructive hypertrophic cardiomyopathy: Secondary | ICD-10-CM | POA: Diagnosis not present

## 2024-08-15 DIAGNOSIS — E876 Hypokalemia: Secondary | ICD-10-CM | POA: Insufficient documentation

## 2024-08-15 DIAGNOSIS — I10 Essential (primary) hypertension: Secondary | ICD-10-CM | POA: Insufficient documentation

## 2024-08-15 NOTE — Patient Instructions (Addendum)
 Medication Instructions:   Continue all current medications.   Labwork:  none  Testing/Procedures:  none  Follow-Up:  2-3 months   Any Other Special Instructions Will Be Listed Below (If Applicable).  CHF info  [place] six  If you need a refill on your cardiac medications before your next appointment, please call your pharmacy.

## 2024-08-15 NOTE — Progress Notes (Signed)
 Cardiology Office Note:  .   Date: 08/15/2024 ID:  Richard Cox, DOB 07/12/1981, MRN [id number] PCP: Terry Wilhelmena Lloyd Hilario, FNP  [facility] Providers Cardiologist:  Jayson Sierras, MD    History of Present Illness: .   Richard Cox is a 43 y.o. male with a PMH of CHF, HTN, lymphoma, anxiety, HIV, polysubstance abuse, who presents today for overdue 6-8 week follow-up.   Hospitalized for respiratory failure d/t RSV 11/2023.   Hospitalized 12/15/2023 ED in Feb 2025 d/t shortness of breath. Forgot to take Lasix . On arrival to ED, was hypertensive, tachycardic, and tachypneic. UDS positive for cocaine and marijuana. BNP elevated > 4,000. Trops elevated. Respiratory viral panel negative. CT angio chest neg for PE, evidence of multifocal PNA, CM with severe LVH, see full report below. Admitted and treated for acute hypoxemic respiratory failure secondary to combined CHF exacerbation in setting of hypertensive crisis related to medication noncompliance and cocaine use. Was weaned of nitroglycerin  drip and O2, was requesting d/c. Was requested that a repeat 2D echo was required and BP needed to be stabilized. Left AMA.   Today he presents for overdue hospital follow-up. Pt was no show several times for office evaluation prior to arriving today. Doing well. Denies any chest pain, shortness of breath, palpitations, syncope, presyncope, dizziness, orthopnea, PND, swelling or significant weight changes, acute bleeding, or claudication. Admits to shortness of breath when he does not take his Lasix . Currently he has doubled his Lasix  dosage because he did not get relief of symptoms from previous dosage. Currently taking Lasix  80 mg BID. He is requesting a refill of Lasix  today. Does not check BP at home.   Since I last seen him, he was in the ED on July 12, 2024 due to left upper quadrant abdominal pain.  Was noted to have significant BP elevation, highest being 194/130.  Troponins were noted to be  chronically elevated, stable.  Patient denied any chest pain or shortness of breath.  Chest x-ray showed stable cardiomegaly, vascular congestion.  CT abdomen pelvis negative for anything acute.  ED visit on August 02, 2024 due to shortness of breath at [facility]. BNP elevated over 2,000.  Chest x-ray negative for anything acute.  Troponins mildly elevated but flat.  He was given IV Lasix  and some oral potassium.  10/87/2025 - Today he presents for outpatient follow-up.  He states he is doing well. Does admit to a cough, but overall doing well from a cardiac perspective and denies any acute cardiac complaints or issues. Weights are stable. Denies any chest pain, shortness of breath, palpitations, syncope, presyncope, dizziness, orthopnea, PND, swelling or significant weight changes, acute bleeding, or claudication.  SH: Admits to smoking cigars about twice per week. Denies any alcohol use. Admits to very infrequent illicit drug use, including cocaine and marijuana.   FH: Both parents are living. Mother had a stroke 3 weeks ago, getting better, also past hx of MI. Oldest brother passed away 2 years ago at age 65 years old, stated he had heart disease and CHF, known hx of CABG and 2 previous MI's.  ROS: Negative. See HPI.   Studies Reviewed: SABRA    EKG is not ordered today.   Echo 02/2023:  1. Left ventricular ejection fraction, by estimation, is 25 to 30%. The  left ventricle has severely decreased function. The left ventricle  demonstrates global hypokinesis. There is severe asymmetric left  ventricular hypertrophy of the septal segment,  also involving RV. Consistent with hypertrophic  cardiomyopathy, although no obvious LVOT gradient to suggest obstruction at rest. Left ventricular diastolic parameters are consistent with Grade I diastolic dysfunction (impaired relaxation).   2. Right ventricular systolic function is normal. The right ventricular  size is normal. Severely increased right  ventricular wall thickness. There  is normal pulmonary artery systolic pressure. The estimated right  ventricular systolic pressure is 24.0 mmHg.   3. Left atrial size was severely dilated.   4. The mitral valve is grossly normal. No evidence of mitral valve  regurgitation.   5. The aortic valve is tricuspid. Aortic valve regurgitation is not  visualized.   6. The inferior vena cava is normal in size with greater than 50%  respiratory variability, suggesting right atrial pressure of 3 mmHg.   Comparison(s): Prior images reviewed side by side. LVEF similar to prior study per review.  cMRI 08/2021:  IMPRESSION: 1. 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 31 mm massive septal hypertrophy in the mid left ventricular septum. Significant diffuse delayed myocardial enhancement appears >15% of myocardial mass. ECV 36%, nonspecific elevated. T2 is 59 msec in area of maximal wall thickness. Findings of >30 mm wall thickness and elevated T2 signal suggest higher risk phenotype HCM.   2. Severely reduced LV systolic function by visual estimate, LVEF 30-35% with global hypokinesis.   2.  Mildly reduced RV function with normal RV size.   4. ECV value and inversion time myocardial nulling kinetics suggest against a diagnosis of cardiac amyloidosis.  Physical Exam:   VS:  BP (!) 140/81   Pulse 83   Ht 6' 1 (1.854 m)   Wt 157 lb 6.4 oz (71.4 kg)   SpO2 98%   BMI 20.77 kg/m    Wt Readings from Last 3 Encounters:  08/15/24 157 lb 6.4 oz (71.4 kg)  07/12/24 161 lb (73 kg)  07/07/24 161 lb (73 kg)    GEN: Thin, 43 y.o. male in no acute distress NECK: No JVD; No carotid bruits CARDIAC: S1/S2, RRR, no murmurs, rubs, gallops RESPIRATORY:  Clear to auscultation without rales, wheezing or rhonchi  ABDOMEN: Soft, non-tender, non-distended EXTREMITIES:  No edema; No deformity   ASSESSMENT AND PLAN: .    Chronic combined CHF,  HOCM Stage C, NYHA class I-II symptoms. EF 25-30% in April 2024 with findings of severe asymmetric LVH of septal segment, also involving rV, consistent with HOCM, no LVOT gradient noted to suggest obstruction, grade 1 DD. Euvolemic and well compensated on exam. Previous cMRI in 2022 revealed LVEF 30-35%, findings most consistent with HOCM without obstruction, 31 mm massive septal hypertrophy in mid left ventricular eptum. Findings of > 30 mm wall thickness and elevated T2 signal suggest higher risk phenotype of HCM, ECV value and inverison time myocardial nulling kinetics suggested against diagnosis of cardiac amyloidosis. Consulted Dr. Debera today who stated that patient would benefit from repeat imaging to evaluate his LVEF, and will consult HCM Cardiologist of HCM Clinic regarding recs, Dr. Santo of cMRI vs Echo and monitor. Will also discuss possibly starting Aldactone  12.5 mg daily past Dr. Santo.  Continue current medication regimen. Plan to adjust GDMT over time. Low sodium diet, fluid restriction <2L, and daily weights encouraged. Educated to contact our office for weight gain of 2 lbs overnight or 5 lbs in one week.  HTN BP elevated and not at goal. Discussed to monitor BP at home at least 2 hours after medications and sitting for 5-10 minutes. Continue current medication regimen. He  will monitor and bring BP log to next office visit. Will discuss more with Dr. Santo regarding Aldactone  as noted above.  Heart healthy diet and regular cardiovascular exercise encouraged.   Polysubstance abuse He has weaned down on this per his report I have previously given information of local department to help with substance abuse. He verbalized understanding. Discussed risks regarding use. He verbalized understanding.   4. Family hx of ASCVD Does have positive FH of ASCVD. Plan to address obtaining lipid profile at future visit. Heart healthy diet and regular cardiovascular exercise  encouraged.   5. Elevated serum creatinine, hypokalemia Most recent labs showed serum creatinine 1.49 with a eGFR greater than 60.  Potassium was 3.1.  Plan to discuss starting spironolactone  12.5 mg daily with Dr. Arnetha as noted above. Continue to follow with PCP.   Dispo: Follow-up with MD/APP in 2-3 months or sooner if anything changes.   Signed, Almarie Crate, NP

## 2024-08-21 ENCOUNTER — Telehealth: Payer: Self-pay | Admitting: Nurse Practitioner

## 2024-08-21 DIAGNOSIS — I422 Other hypertrophic cardiomyopathy: Secondary | ICD-10-CM

## 2024-08-21 DIAGNOSIS — Z91199 Patient's noncompliance with other medical treatment and regimen due to unspecified reason: Secondary | ICD-10-CM

## 2024-08-21 DIAGNOSIS — I5042 Chronic combined systolic (congestive) and diastolic (congestive) heart failure: Secondary | ICD-10-CM

## 2024-08-21 NOTE — Telephone Encounter (Signed)
-----   Message from Almarie Crate sent at 08/17/2024  7:52 AM EDT ----- I have consulted Dr. Santo about this patient. Please begin patient on Aldactone  12.5 mg daily and obtain a BMET in 1 week. Please arrange Echo for diagnosis of HCM - I am afraid with his compliance issues he will not complete the cMRI in [place]. Please refer him to social worker for his case to assist in any way - very complex patient. Keep follow-up as scheduled.   Thanks!   Best, Almarie Crate, NP ----- Message ----- From: Santo Stanly LABOR, MD Sent: 08/16/2024   1:14 PM EDT To: Almarie Crate, NP  Thanks for reaching out: - I would have no issues with him starting an MRA.  He has the burnout phenotype of HCM (maybe this was because of his thick ventricle, may be because of substance abuse).  Things he needs - ARNI, GDMT titration and support for medications (and taking them) - resolution of AIDS, when this resolves and ID clears him, EP should see him for the consideration of transvenous DC ICD - Cardiopulmonary exercise testing  This patient likely won't be able to get HCM specific therapies.  He is young, in a world without AIDS and substance abuse, he would be looking at a heart transplant in his lifetime.  Sounds like a complex young man.  Is Social Work involved?  Thanks, MAC ----- Message ----- From: Crate Almarie, NP Sent: 08/15/2024   3:44 PM EDT To: Stanly LABOR Santo, MD  Hello Dr. Santo,   I have a patient that I am considering referring to your HCM office.  History of polysubstance abuse, HIV, etc. Complex history. Needs better BP control and most recent BNP elevated. I wanted to ask you some questions and consult you about this patient.   1. Do you recommend starting low dose Aldactone ? I know it's generally not used in HCM, but he had a recent elevated BNP and history of low potassium.  I believe he would benefit from this medicine. 2. Does have past hx of  non-compliance and missed appts. I wanted to see if you recommend repeating Echo vs cMRI since it's been at least 3 years since last cMRI.  3. Any other suggestions/recs you have.   Thanks! Appreciate your time and input.   Best, Almarie Crate, NP

## 2024-08-21 NOTE — Telephone Encounter (Signed)
 MyChart message sent to patient.

## 2024-08-21 NOTE — Telephone Encounter (Signed)
 Left message for patient to return call.

## 2024-08-22 MED ORDER — SPIRONOLACTONE 25 MG PO TABS: 12.5000 mg | ORAL_TABLET | Freq: Every day | ORAL | 0 refills | Status: AC

## 2024-08-22 NOTE — Telephone Encounter (Signed)
 Patient informed and verbalized understanding of plan. Patient has agreed, medication sent in, lab sent to be done at [facility], Echo order placed patient will do first avaliable with [facility] or [facility], and referral placed for social work

## 2024-08-22 NOTE — Telephone Encounter (Signed)
Pt returning call from yesterday. Please advise 

## 2024-08-22 NOTE — Addendum Note (Signed)
 Addended by: Janely Gullickson on: 08/22/2024 02:27 PM   Modules accepted: Orders

## 2024-08-23 ENCOUNTER — Telehealth (HOSPITAL_COMMUNITY): Payer: Self-pay | Admitting: Licensed Clinical Social Worker

## 2024-08-23 NOTE — Telephone Encounter (Signed)
 CSW consulted to reach out to patient to assess for needs with goal of increasing compliance.  CSW attempted to call but went straight to VM- left message requesting return call  Richard HILARIO Leech, LCSW Clinical Social Worker [facility] Desk#: [phone] Cell#: [phone]

## 2024-08-24 ENCOUNTER — Telehealth (HOSPITAL_COMMUNITY): Payer: Self-pay | Admitting: Licensed Clinical Social Worker

## 2024-08-24 NOTE — Telephone Encounter (Signed)
 CSW attempted to call pt to complete SDOH screening and offer support- unable to reach- VM left requesting return call  Andriette HILARIO Leech, LCSW Clinical Social Worker [facility] Desk#: [phone] Cell#: [phone]

## 2024-08-28 ENCOUNTER — Ambulatory Visit: Attending: Nurse Practitioner

## 2024-08-28 DIAGNOSIS — I5042 Chronic combined systolic (congestive) and diastolic (congestive) heart failure: Secondary | ICD-10-CM | POA: Diagnosis not present

## 2024-08-28 DIAGNOSIS — I422 Other hypertrophic cardiomyopathy: Secondary | ICD-10-CM | POA: Diagnosis not present

## 2024-08-29 LAB — ECHOCARDIOGRAM COMPLETE
AR max vel: 3 cm2
AV Area VTI: 2.97 cm2
AV Area mean vel: 2.81 cm2
AV Mean grad: 5.3 mmHg
AV Peak grad: 10.3 mmHg
Ao pk vel: 1.61 m/s
Area-P 1/2: 3.74 cm2
Calc EF: 43.8 %
S' Lateral: 4.2 cm
Single Plane A2C EF: 41.3 %
Single Plane A4C EF: 40.9 %

## 2024-09-03 ENCOUNTER — Other Ambulatory Visit: Payer: Self-pay | Admitting: Nurse Practitioner

## 2024-09-03 ENCOUNTER — Telehealth: Payer: Self-pay | Admitting: Physician Assistant

## 2024-09-03 DIAGNOSIS — I5042 Chronic combined systolic (congestive) and diastolic (congestive) heart failure: Secondary | ICD-10-CM

## 2024-09-03 NOTE — Telephone Encounter (Signed)
    Patient called to request refill on Lasix . Patient last took Lasix  on Friday. Reports worsening SOB with minimal exertion, bilateral LE edema, and orthopnea. He states he feels fluids in his lungs. Denies any chest pain, dizziness, syncope.  Per chart review, last K 3.1 in 08/02/2024. Given recent severe hypokalemia, feel it is safest to have updated labs before acutely refilling. Discussed that given more lasix  could lower K and is not recommended without repeat labs. His symptoms are also concerning. Strongly recommended ED visit. Patient is not in favor of ED but states he will try his best to get a ride to ED. Recommended EMS but patient prefers to decline. Patient states he will attempt to find his own ride. Patient plans to go to [facility] ED.    Signed, Lorette CINDERELLA Kapur, PA-C 09/03/2024, 10:35 AM Pager: [phone]

## 2024-09-04 ENCOUNTER — Telehealth: Payer: Self-pay | Admitting: Cardiology

## 2024-09-04 DIAGNOSIS — I5042 Chronic combined systolic (congestive) and diastolic (congestive) heart failure: Secondary | ICD-10-CM

## 2024-09-04 MED ORDER — FUROSEMIDE 80 MG PO TABS: 80.0000 mg | ORAL_TABLET | Freq: Two times a day (BID) | ORAL | 1 refills | Status: DC

## 2024-09-04 NOTE — Telephone Encounter (Signed)
*  STAT* If patient is at the pharmacy, call can be transferred to refill team.   1. Which medications need to be refilled? (please list name of each medication and dose if known)   furosemide  (LASIX ) 80 MG tablet   2. Would you like to learn more about the convenience, safety, & potential cost savings by using the [facility]?   3. Are you open to using the [facility] (Type [facility]).   4. Which pharmacy/location (including street and city if local pharmacy) is medication to be sent to?  [id number] - , [place] - [address]   5. Do they need a 30 day or 90 day supply?   90 day  Patient stated he is completely out of this medication.

## 2024-09-04 NOTE — Telephone Encounter (Signed)
 Sent in refill

## 2024-09-08 ENCOUNTER — Telehealth (HOSPITAL_COMMUNITY): Payer: Self-pay | Admitting: Licensed Clinical Social Worker

## 2024-09-08 NOTE — Telephone Encounter (Signed)
 CSW attempted to reach patient to assess for SDOH concerns and offer support as needed.  Unable to reach- left VM requesting return call  Andriette HILARIO Leech, LCSW Clinical Social Worker [facility] Desk#: [phone] Cell#: [phone]

## 2024-09-29 ENCOUNTER — Ambulatory Visit: Payer: Self-pay

## 2024-10-19 ENCOUNTER — Telehealth: Payer: Self-pay | Admitting: Nurse Practitioner

## 2024-10-19 NOTE — Telephone Encounter (Signed)
 Pt c/o medication issue:  1. Name of Medication: furosemide  (LASIX ) 80 MG tablet   2. How are you currently taking this medication (dosage and times per day)? Higher dosage than prescribed   3. Are you having a reaction (difficulty breathing--STAT)? No   4. What is your medication issue? Pt states he had been advised by E. Peck to increase medication dosage. He has ran out early due to this dosage change and insurance will not cover. Pt is asking an updated Rx be sent to his pharmacy. Please advise.

## 2024-10-19 NOTE — Telephone Encounter (Signed)
 Spoke with patient regarding how he was taking his medication Furosemide . He reported that he is taking 2 80 mg tablets in the morning (160 mg) and will take one table in the afternoon and maybe an additional one as well. He stated he doesn't exceed 4 in a day. He was taking Spironolactone  as well. I asked how he was taking that one. He started at 12.5 mg daily then went to a whole tablet 25 mg until he ran out. He reported that it wasn't working and then increased his Furosemide . Reports no symptoms he was feeling fine. Just needed a refill on medication. I advised him that will send provider a message regarding increase of medication and will call him back once she responds. Patient verbalized understanding

## 2024-11-14 ENCOUNTER — Ambulatory Visit: Admitting: Nurse Practitioner

## 2024-11-20 ENCOUNTER — Ambulatory Visit: Payer: Self-pay

## 2024-11-23 ENCOUNTER — Ambulatory Visit: Attending: Nurse Practitioner | Admitting: Nurse Practitioner

## 2024-11-23 ENCOUNTER — Encounter: Payer: Self-pay | Admitting: Nurse Practitioner

## 2024-11-27 ENCOUNTER — Ambulatory Visit: Payer: Self-pay | Admitting: Nurse Practitioner

## 2024-11-28 ENCOUNTER — Telehealth: Payer: Self-pay | Admitting: Nurse Practitioner

## 2024-11-28 NOTE — Telephone Encounter (Signed)
 ERROR

## 2024-11-28 NOTE — Telephone Encounter (Signed)
 Stable findings since last Echo. Similar wall thickness to heart. 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 I would like to see him back as he is due for follow-up with me and was a recent no show, as I was going to review this with him. I want to discuss adjusting his medications as recommended by Dr. Santo.    Almarie Crate, AGNP-C   11/28/2024 Unable to reach patient

## 2024-11-28 NOTE — Telephone Encounter (Signed)
 Stable findings since last Echo. Similar wall thickness to heart. 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 I would like to see him back as he is due for follow-up with me and was a recent no show, as I was going to review this with him. I want to discuss adjusting his medications as recommended by Dr. Santo.    Almarie Crate, AGNP-C   11/28/2024 tried calling patient x 2 -unable to reach

## 2024-12-03 ENCOUNTER — Other Ambulatory Visit: Payer: Self-pay | Admitting: Nurse Practitioner

## 2024-12-03 DIAGNOSIS — I5042 Chronic combined systolic (congestive) and diastolic (congestive) heart failure: Secondary | ICD-10-CM

## 2024-12-05 ENCOUNTER — Other Ambulatory Visit: Payer: Self-pay | Admitting: Nurse Practitioner

## 2024-12-05 DIAGNOSIS — I5042 Chronic combined systolic (congestive) and diastolic (congestive) heart failure: Secondary | ICD-10-CM

## 2024-12-14 ENCOUNTER — Encounter: Payer: Self-pay | Admitting: *Deleted

## 2024-12-15 ENCOUNTER — Other Ambulatory Visit: Payer: Self-pay | Admitting: *Deleted

## 2024-12-15 MED ORDER — HYDRALAZINE HCL 25 MG PO TABS: 25.0000 mg | ORAL_TABLET | Freq: Three times a day (TID) | ORAL | 1 refills | Status: AC

## 2025-01-05 ENCOUNTER — Ambulatory Visit: Admitting: Family

## 2025-01-18 ENCOUNTER — Ambulatory Visit: Admitting: Cardiology
